# Patient Record
Sex: Female | Born: 1965 | Race: White | Hispanic: No | Marital: Married | Smoking: Never smoker
Health system: Southern US, Community
[De-identification: ages and names within clinical notes are randomized; demographics above are authoritative.]

## PROBLEM LIST (undated history)

## (undated) DIAGNOSIS — C539 Malignant neoplasm of cervix uteri, unspecified: Secondary | ICD-10-CM

## (undated) DIAGNOSIS — F329 Major depressive disorder, single episode, unspecified: Secondary | ICD-10-CM

## (undated) DIAGNOSIS — E119 Type 2 diabetes mellitus without complications: Secondary | ICD-10-CM

## (undated) DIAGNOSIS — K219 Gastro-esophageal reflux disease without esophagitis: Secondary | ICD-10-CM

## (undated) DIAGNOSIS — Z89611 Acquired absence of right leg above knee: Secondary | ICD-10-CM

## (undated) DIAGNOSIS — E785 Hyperlipidemia, unspecified: Secondary | ICD-10-CM

## (undated) DIAGNOSIS — F32A Depression, unspecified: Secondary | ICD-10-CM

## (undated) DIAGNOSIS — G629 Polyneuropathy, unspecified: Secondary | ICD-10-CM

## (undated) DIAGNOSIS — D649 Anemia, unspecified: Secondary | ICD-10-CM

## (undated) DIAGNOSIS — Z89612 Acquired absence of left leg above knee: Secondary | ICD-10-CM

## (undated) DIAGNOSIS — F4310 Post-traumatic stress disorder, unspecified: Secondary | ICD-10-CM

## (undated) HISTORY — PX: OTHER SURGICAL HISTORY: SHX169

## (undated) HISTORY — DX: Depression, unspecified: F32.A

## (undated) HISTORY — DX: Malignant neoplasm of cervix uteri, unspecified: C53.9

## (undated) HISTORY — DX: Gastro-esophageal reflux disease without esophagitis: K21.9

## (undated) HISTORY — DX: Type 2 diabetes mellitus without complications: E11.9

## (undated) HISTORY — PX: ABDOMINAL HYSTERECTOMY: SHX81

## (undated) HISTORY — DX: Hyperlipidemia, unspecified: E78.5

## (undated) HISTORY — DX: Major depressive disorder, single episode, unspecified: F32.9

## (undated) HISTORY — DX: Post-traumatic stress disorder, unspecified: F43.10

---

## 2000-09-29 ENCOUNTER — Encounter: Admission: RE | Admit: 2000-09-29 | Discharge: 2000-10-24 | Payer: Self-pay | Admitting: Family Medicine

## 2004-09-08 ENCOUNTER — Inpatient Hospital Stay (HOSPITAL_COMMUNITY): Admission: EM | Admit: 2004-09-08 | Discharge: 2004-09-22 | Payer: Self-pay | Admitting: Emergency Medicine

## 2005-07-08 HISTORY — PX: UMBILICAL HERNIA REPAIR: SHX196

## 2005-07-27 ENCOUNTER — Inpatient Hospital Stay (HOSPITAL_COMMUNITY): Admission: EM | Admit: 2005-07-27 | Discharge: 2005-07-31 | Payer: Self-pay | Admitting: Emergency Medicine

## 2005-12-11 ENCOUNTER — Emergency Department (HOSPITAL_COMMUNITY): Admission: EM | Admit: 2005-12-11 | Discharge: 2005-12-12 | Payer: Self-pay | Admitting: Emergency Medicine

## 2006-01-02 ENCOUNTER — Ambulatory Visit (HOSPITAL_COMMUNITY): Admission: RE | Admit: 2006-01-02 | Discharge: 2006-01-02 | Payer: Self-pay | Admitting: Family Medicine

## 2006-01-15 ENCOUNTER — Encounter (INDEPENDENT_AMBULATORY_CARE_PROVIDER_SITE_OTHER): Payer: Self-pay | Admitting: Specialist

## 2006-01-15 ENCOUNTER — Ambulatory Visit (HOSPITAL_COMMUNITY): Admission: RE | Admit: 2006-01-15 | Discharge: 2006-01-15 | Payer: Self-pay | Admitting: General Surgery

## 2006-04-15 ENCOUNTER — Emergency Department (HOSPITAL_COMMUNITY): Admission: EM | Admit: 2006-04-15 | Discharge: 2006-04-16 | Payer: Self-pay | Admitting: Emergency Medicine

## 2007-10-15 ENCOUNTER — Ambulatory Visit (HOSPITAL_COMMUNITY): Admission: RE | Admit: 2007-10-15 | Discharge: 2007-10-15 | Payer: Self-pay | Admitting: Family Medicine

## 2007-12-22 ENCOUNTER — Ambulatory Visit (HOSPITAL_COMMUNITY): Admission: RE | Admit: 2007-12-22 | Discharge: 2007-12-22 | Payer: Self-pay | Admitting: Family Medicine

## 2008-04-28 ENCOUNTER — Emergency Department (HOSPITAL_COMMUNITY): Admission: EM | Admit: 2008-04-28 | Discharge: 2008-04-28 | Payer: Self-pay | Admitting: Emergency Medicine

## 2008-08-05 ENCOUNTER — Other Ambulatory Visit: Payer: Self-pay

## 2008-08-05 ENCOUNTER — Other Ambulatory Visit: Payer: Self-pay | Admitting: Emergency Medicine

## 2008-08-05 ENCOUNTER — Other Ambulatory Visit: Payer: Self-pay | Admitting: Physician Assistant

## 2008-08-05 ENCOUNTER — Ambulatory Visit: Payer: Self-pay | Admitting: *Deleted

## 2008-08-05 ENCOUNTER — Inpatient Hospital Stay (HOSPITAL_COMMUNITY): Admission: RE | Admit: 2008-08-05 | Discharge: 2008-08-16 | Payer: Self-pay | Admitting: *Deleted

## 2009-12-26 ENCOUNTER — Inpatient Hospital Stay (HOSPITAL_COMMUNITY): Admission: EM | Admit: 2009-12-26 | Discharge: 2010-01-02 | Payer: Self-pay | Admitting: Emergency Medicine

## 2010-09-23 LAB — CULTURE, BLOOD (ROUTINE X 2)
Culture: NO GROWTH
Report Status: 6262011

## 2010-09-23 LAB — DIFFERENTIAL
Basophils Absolute: 0 10*3/uL (ref 0.0–0.1)
Basophils Absolute: 0 10*3/uL (ref 0.0–0.1)
Basophils Absolute: 0 10*3/uL (ref 0.0–0.1)
Basophils Relative: 1 % (ref 0–1)
Eosinophils Absolute: 0.1 10*3/uL (ref 0.0–0.7)
Eosinophils Absolute: 0.1 10*3/uL (ref 0.0–0.7)
Eosinophils Relative: 1 % (ref 0–5)
Eosinophils Relative: 2 % (ref 0–5)
Lymphocytes Relative: 27 % (ref 12–46)
Lymphocytes Relative: 29 % (ref 12–46)
Lymphocytes Relative: 29 % (ref 12–46)
Lymphocytes Relative: 33 % (ref 12–46)
Lymphs Abs: 1.5 10*3/uL (ref 0.7–4.0)
Lymphs Abs: 1.7 10*3/uL (ref 0.7–4.0)
Lymphs Abs: 1.8 10*3/uL (ref 0.7–4.0)
Monocytes Absolute: 0.3 10*3/uL (ref 0.1–1.0)
Monocytes Absolute: 0.4 10*3/uL (ref 0.1–1.0)
Monocytes Relative: 6 % (ref 3–12)
Monocytes Relative: 7 % (ref 3–12)
Neutro Abs: 2.7 10*3/uL (ref 1.7–7.7)
Neutro Abs: 3.5 10*3/uL (ref 1.7–7.7)
Neutro Abs: 3.7 10*3/uL (ref 1.7–7.7)
Neutro Abs: 3.9 10*3/uL (ref 1.7–7.7)
Neutrophils Relative %: 59 % (ref 43–77)
Neutrophils Relative %: 63 % (ref 43–77)
Neutrophils Relative %: 65 % (ref 43–77)

## 2010-09-23 LAB — GLUCOSE, CAPILLARY
Glucose-Capillary: 106 mg/dL — ABNORMAL HIGH (ref 70–99)
Glucose-Capillary: 113 mg/dL — ABNORMAL HIGH (ref 70–99)
Glucose-Capillary: 156 mg/dL — ABNORMAL HIGH (ref 70–99)
Glucose-Capillary: 167 mg/dL — ABNORMAL HIGH (ref 70–99)
Glucose-Capillary: 178 mg/dL — ABNORMAL HIGH (ref 70–99)
Glucose-Capillary: 208 mg/dL — ABNORMAL HIGH (ref 70–99)
Glucose-Capillary: 219 mg/dL — ABNORMAL HIGH (ref 70–99)
Glucose-Capillary: 239 mg/dL — ABNORMAL HIGH (ref 70–99)
Glucose-Capillary: 245 mg/dL — ABNORMAL HIGH (ref 70–99)
Glucose-Capillary: 246 mg/dL — ABNORMAL HIGH (ref 70–99)
Glucose-Capillary: 250 mg/dL — ABNORMAL HIGH (ref 70–99)
Glucose-Capillary: 257 mg/dL — ABNORMAL HIGH (ref 70–99)
Glucose-Capillary: 262 mg/dL — ABNORMAL HIGH (ref 70–99)
Glucose-Capillary: 264 mg/dL — ABNORMAL HIGH (ref 70–99)

## 2010-09-23 LAB — CBC
HCT: 30 % — ABNORMAL LOW (ref 36.0–46.0)
HCT: 34 % — ABNORMAL LOW (ref 36.0–46.0)
Hemoglobin: 10.3 g/dL — ABNORMAL LOW (ref 12.0–15.0)
Hemoglobin: 11.1 g/dL — ABNORMAL LOW (ref 12.0–15.0)
Hemoglobin: 9.7 g/dL — ABNORMAL LOW (ref 12.0–15.0)
Hemoglobin: 9.8 g/dL — ABNORMAL LOW (ref 12.0–15.0)
MCH: 25 pg — ABNORMAL LOW (ref 26.0–34.0)
MCHC: 32.6 g/dL (ref 30.0–36.0)
MCHC: 32.8 g/dL (ref 30.0–36.0)
MCV: 76.5 fL — ABNORMAL LOW (ref 78.0–100.0)
MCV: 77.4 fL — ABNORMAL LOW (ref 78.0–100.0)
Platelets: 259 10*3/uL (ref 150–400)
Platelets: 267 10*3/uL (ref 150–400)
RBC: 3.79 MIL/uL — ABNORMAL LOW (ref 3.87–5.11)
RBC: 3.92 MIL/uL (ref 3.87–5.11)
RBC: 3.99 MIL/uL (ref 3.87–5.11)
RBC: 4.39 MIL/uL (ref 3.87–5.11)
RDW: 14.5 % (ref 11.5–15.5)
WBC: 4.5 10*3/uL (ref 4.0–10.5)
WBC: 5.4 10*3/uL (ref 4.0–10.5)
WBC: 5.8 10*3/uL (ref 4.0–10.5)

## 2010-09-23 LAB — BASIC METABOLIC PANEL
BUN: 10 mg/dL (ref 6–23)
CO2: 29 mEq/L (ref 19–32)
Calcium: 8.5 mg/dL (ref 8.4–10.5)
Calcium: 8.6 mg/dL (ref 8.4–10.5)
Chloride: 103 mEq/L (ref 96–112)
Chloride: 104 mEq/L (ref 96–112)
Creatinine, Ser: 0.63 mg/dL (ref 0.4–1.2)
Creatinine, Ser: 0.65 mg/dL (ref 0.4–1.2)
Creatinine, Ser: 0.7 mg/dL (ref 0.4–1.2)
GFR calc Af Amer: >60 mL/min (ref >60)
GFR calc Af Amer: >60 mL/min (ref >60)
GFR calc non Af Amer: >60 mL/min (ref >60)
GFR calc non Af Amer: >60 mL/min (ref >60)
Glucose, Bld: 117 mg/dL — ABNORMAL HIGH (ref 70–99)
Potassium: 3.6 mEq/L (ref 3.5–5.1)
Potassium: 3.7 mEq/L (ref 3.5–5.1)
Sodium: 137 mEq/L (ref 135–145)
Sodium: 139 mEq/L (ref 135–145)
Sodium: 140 mEq/L (ref 135–145)

## 2010-09-23 LAB — FERRITIN: Ferritin: 4 ng/mL — ABNORMAL LOW (ref 10–291)

## 2010-09-23 LAB — RETICULOCYTES
RBC.: 3.8 MIL/uL — ABNORMAL LOW (ref 3.87–5.11)
Retic Ct Pct: 2.6 % (ref 0.4–3.1)

## 2010-09-23 LAB — KETONES, QUALITATIVE: Acetone, Bld: NEGATIVE

## 2010-09-23 LAB — VITAMIN B12: Vitamin B-12: 186 pg/mL — ABNORMAL LOW (ref 211–911)

## 2010-09-23 LAB — VANCOMYCIN, TROUGH: Vancomycin Tr: 10.5 ug/mL (ref 10.0–20.0)

## 2010-09-23 LAB — IRON AND TIBC
Saturation Ratios: 6 % — ABNORMAL LOW (ref 20–55)
UIBC: 301 ug/dL

## 2010-10-22 LAB — URINALYSIS, ROUTINE W REFLEX MICROSCOPIC
Glucose, UA: >1000 mg/dL — AB
Hgb urine dipstick: NEGATIVE
Specific Gravity, Urine: 1.02 (ref 1.005–1.030)
pH: 5 (ref 5.0–8.0)

## 2010-10-22 LAB — BASIC METABOLIC PANEL
BUN: 6 mg/dL (ref 6–23)
BUN: 8 mg/dL (ref 6–23)
Chloride: 101 mEq/L (ref 96–112)
Creatinine, Ser: 0.73 mg/dL (ref 0.4–1.2)
Creatinine, Ser: 0.74 mg/dL (ref 0.4–1.2)
GFR calc non Af Amer: >60 mL/min (ref >60)
GFR calc non Af Amer: >60 mL/min (ref >60)

## 2010-10-22 LAB — RAPID URINE DRUG SCREEN, HOSP PERFORMED
Amphetamines: NOT DETECTED
Barbiturates: NOT DETECTED
Opiates: NOT DETECTED

## 2010-10-22 LAB — CBC
MCV: 81.7 fL (ref 78.0–100.0)
Platelets: 301 10*3/uL (ref 150–400)
WBC: 7.1 10*3/uL (ref 4.0–10.5)

## 2010-10-22 LAB — GLUCOSE, CAPILLARY
Glucose-Capillary: 259 mg/dL — ABNORMAL HIGH (ref 70–99)
Glucose-Capillary: 169 mg/dL — ABNORMAL HIGH (ref 70–99)
Glucose-Capillary: 246 mg/dL — ABNORMAL HIGH (ref 70–99)
Glucose-Capillary: 253 mg/dL — ABNORMAL HIGH (ref 70–99)
Glucose-Capillary: 315 mg/dL — ABNORMAL HIGH (ref 70–99)

## 2010-10-22 LAB — URINE MICROSCOPIC-ADD ON

## 2010-10-22 LAB — ETHANOL: Alcohol, Ethyl (B): <5 mg/dL (ref 0–10)

## 2010-10-22 LAB — DIFFERENTIAL
Eosinophils Absolute: 0 10*3/uL (ref 0.0–0.7)
Lymphs Abs: 1.4 10*3/uL (ref 0.7–4.0)
Neutrophils Relative %: 74 % (ref 43–77)

## 2010-10-23 LAB — GLUCOSE, CAPILLARY
Glucose-Capillary: 118 mg/dL — ABNORMAL HIGH (ref 70–99)
Glucose-Capillary: 119 mg/dL — ABNORMAL HIGH (ref 70–99)
Glucose-Capillary: 129 mg/dL — ABNORMAL HIGH (ref 70–99)
Glucose-Capillary: 132 mg/dL — ABNORMAL HIGH (ref 70–99)
Glucose-Capillary: 137 mg/dL — ABNORMAL HIGH (ref 70–99)
Glucose-Capillary: 139 mg/dL — ABNORMAL HIGH (ref 70–99)
Glucose-Capillary: 142 mg/dL — ABNORMAL HIGH (ref 70–99)
Glucose-Capillary: 145 mg/dL — ABNORMAL HIGH (ref 70–99)
Glucose-Capillary: 153 mg/dL — ABNORMAL HIGH (ref 70–99)
Glucose-Capillary: 162 mg/dL — ABNORMAL HIGH (ref 70–99)
Glucose-Capillary: 172 mg/dL — ABNORMAL HIGH (ref 70–99)
Glucose-Capillary: 173 mg/dL — ABNORMAL HIGH (ref 70–99)
Glucose-Capillary: 179 mg/dL — ABNORMAL HIGH (ref 70–99)
Glucose-Capillary: 189 mg/dL — ABNORMAL HIGH (ref 70–99)
Glucose-Capillary: 205 mg/dL — ABNORMAL HIGH (ref 70–99)
Glucose-Capillary: 207 mg/dL — ABNORMAL HIGH (ref 70–99)
Glucose-Capillary: 93 mg/dL (ref 70–99)
Glucose-Capillary: 98 mg/dL (ref 70–99)
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
## 2010-11-23 NOTE — Discharge Summary (Signed)
NAME:  Michelle Skinner, Michelle Skinner                ACCOUNT NO.:  [id number]   MEDICAL RECORD NO.:  [id number]          PATIENT TYPE:  INP   LOCATION:  A313                          FACILITY:  [facility]   PHYSICIAN:  Scott A. Wolfgang Phoenix, MD    DATE OF BIRTH:  Jul 13, 1965   DATE OF ADMISSION:  09/08/2004  DATE OF DISCHARGE:  LH                                 DISCHARGE SUMMARY   DISCHARGE DIAGNOSES:  1.  Group A strep skin infection of the left lower leg with severe skin      ulcerations from it.  2.  Diabetes.  3.  Severe obesity.  4.  Venous stasis edema.   HOSPITAL COURSE:  This patient was admitted in on 09/08/04, and was discharged  on 09/22/04.  She had started off with some sores on her legs over a course  of a few days, with just some sores, large reddened area.  She was admitted  into the hospital, was placed on IV Rocephin and doxycycline.  She had her  glucoses monitored closely.  She was treated with IV antibiotics and quickly  the lesions became bullous in nature, appeared to be very erythematous and  indurated as well.  The patient also had trouble with her glucoses going up,  requiring Lantus.  The patient had dermatology see her, and they recommended  continue on treatment, and an MRI was done on the 10th that did not show any  necrotizing fasciitis.  Also, too, Dr. Orene Desanctis with [facility] was spoken with.  He advised changing the antibiotic to clindamycin 900 mg IV q.8h.  The other  antibiotics were stopped.  She was treated with IV clindamycin over the  course of the next several days.  Glucose stayed under good control with  Lantus.  She gradually improved, but had severe denuding of the skin which  required daily whirlpool treatments, and then daily scrubs, and Silvadene  cream.   DISCHARGE MEDICATIONS:  She was finally stable for discharge.  On the 18th,  she was discharged to home on combination of her usual medications which she  had, plus also Lantus 22 units at night, doxycycline 100 mg  b.i.d.  for the  next 10 days, and apply Silvadene to the leg daily.   DISCHARGE INSTRUCTIONS:  She is also instructed to go get Medicaid set up  for herself, and she is instructed to follow up with [place] in approximately  five days, sooner if problems.   Significant time spent with patient on the day of discharge.      SAL/MEDQ  D:  09/22/2004  T:  09/22/2004  Job:  [id number]

## 2010-11-23 NOTE — Discharge Summary (Signed)
NAME:  Michelle Skinner, Michelle Skinner                ACCOUNT NO.:  [id number]   MEDICAL RECORD NO.:  [id number]          PATIENT TYPE:  INP   LOCATION:  A331                          FACILITY:  [facility]   PHYSICIAN:  Scott A. Wolfgang Phoenix, MD    DATE OF BIRTH:  25-Apr-1966   DATE OF ADMISSION:  07/27/2005  DATE OF DISCHARGE:  01/24/2007LH                                 DISCHARGE SUMMARY   DISCHARGE DIAGNOSES:  1.  Cellulitis of the right leg.  2.  Diabetes.  3.  Obesity.  4.  Chronic pedal edema.   HOSPITAL COURSE:  The patient was admitted in with increased redness,  discomfort, and infection in the right leg. She was treated with  antibiotics. She had a history of MRSA initially treated with doxy and  Levaquin. The patient gradually improved. She was also placed on  clindamycin, and her wound culture grew out some staph. The patient was  stable and felt to ready to go on January 24 and was discharged on  doxycycline b.i.d. for 14 days, increased Lantus and also metformin. Will  follow up in the office in one week, sooner if problems.      Scott A. Wolfgang Phoenix, MD  Electronically Signed     SAL/MEDQ  D:  08/27/2005  T:  08/27/2005  Job:  [id number]

## 2010-11-23 NOTE — Op Note (Signed)
NAME:  Michelle Skinner, CAMPION                ACCOUNT NO.:  [id number]   MEDICAL RECORD NO.:  [id number]          PATIENT TYPE:  AMB   LOCATION:  DAY                           FACILITY:  [facility]   PHYSICIAN:  Jamesetta So, M.D.  DATE OF BIRTH:  Apr 12, 1966   DATE OF PROCEDURE:  01/15/2006  DATE OF DISCHARGE:                                 OPERATIVE REPORT   PREOPERATIVE DIAGNOSIS:  Incisional hernia.   POSTOPERATIVE DIAGNOSIS:  Incisional hernia.   PROCEDURE:  Incisional herniorrhaphy with mesh.   SURGEON:  Jamesetta So, M.D.   ANESTHESIA:  General endotracheal.   INDICATIONS:  The patient is a 45 year old white female who was referred for  evaluation and treatment of an incisional hernia.  She had had tubal  ligation through the umbilicus, in the past, by another surgeon; and now  presents with an enlarging hernia.  The risks and benefits of the procedure  including bleeding, infection, pain, and the possibility of recurrence of  the hernia were fully explained to the patient who gave informed consent.   PROCEDURE NOTE:  The patient was placed in the supine position.  After  induction of general endotracheal anesthesia, the abdomen was prepped and  draped using the usual sterile technique with Betadine.  Surgical site  confirmation was performed.   An infraumbilical transverse incision was made down to the hernia.  The  patient had a large hernia that contained omentum.  The hernia sac was  excised at the fascial level.  A partial omentectomy was performed as not  all the omental contents could be returned into the abdominal cavity.  Once  this was done, the fascial defect was noted to be approximately 6 cm in its  greatest diameter.  The fascia was closed primarily in a transverse fashion  using #0 Ethibond interrupted sutures.  Polypropylene mesh was placed over  this repair and tacked to the fascia using #0 Prolene interrupted sutures.  The base of the umbilicus was secured back  to the mesh using 2-0 Vicryl  suture.  The subcutaneous layer was reapproximated using a 3-0 Vicryl  interrupted suture.  The skin was closed using staples; 0.5% Sensorcaine was  instilled in the surrounding wound.  Betadine ointment and dry sterile  dressing were applied.   All tape and needle counts were correct at the end of the procedure.  The  patient was extubated in the operating room and went back to the recovery  room awake in stable condition.   COMPLICATIONS:  None.   SPECIMEN:  Omentum and hernia sac.   BLOOD LOSS:  Less than 100 mL.      Jamesetta So, M.D.  Electronically Signed     MAJ/MEDQ  D:  01/15/2006  T:  01/15/2006  Job:  [id number]   cc:   Nicki Reaper A. Wolfgang Phoenix, MD  Fax: [phone]

## 2010-11-23 NOTE — Discharge Summary (Signed)
NAME:  Michelle Skinner, Michelle Skinner                ACCOUNT NO.:  [id number]   MEDICAL RECORD NO.:  [id number]          PATIENT TYPE:  INP   LOCATION:  A313                          FACILITY:  [facility]   PHYSICIAN:  Scott A. Wolfgang Phoenix, MD    DATE OF BIRTH:  Oct 31, 1965   DATE OF ADMISSION:  09/08/2004  DATE OF DISCHARGE:  03/18/2006LH                                 DISCHARGE SUMMARY   DISCHARGE DIAGNOSES:  1.  Severe cellulitis of left lower leg, group A Streptococcus.  2.  Diabetes.  3.  Necrolysis of the skin secondary to cellulitis.   I'm sorry, please cancel this whole dictation.  I am just looking through  the chart.  I found that we got a discharge summary.      SAL/MEDQ  D:  10/02/2004  T:  10/02/2004  Job:  [id number]

## 2010-11-23 NOTE — H&P (Signed)
NAME:  Michelle Skinner, Michelle Skinner                ACCOUNT NO.:  [id number]   MEDICAL RECORD NO.:  [id number]          PATIENT TYPE:  INP   LOCATION:  A331                          FACILITY:  [facility]   PHYSICIAN:  Scott A. Wolfgang Phoenix, MD    DATE OF BIRTH:  1966-06-09   DATE OF ADMISSION:  07/27/2005  DATE OF DISCHARGE:  LH                                HISTORY & PHYSICAL   CHIEF COMPLAINT:  Right leg swelling, redness, pain, discomfort, fever,  chills.   HISTORY OF PRESENT ILLNESS:  This is a 45 year old morbidly obese white  female who about 2 days ago started noticing a little bit of discomfort in  the right leg then she started having fever and chills then from last night  into today she has noted rapidly increasing redness, soreness and discomfort  that is spreading up her leg.  She denies nausea, vomiting, diarrhea.  She  does relate fever and chills.  She does state energy level is somewhat  decreased, is able to eat and drink.  The patient states her sugars have  been running a little higher than normal.  The patient states she has been  compliant with her medicine recently although in the past she has not been  compliant.   PAST MEDICAL HISTORY:  The patient has multiple health problems including  severe morbid obesity, depression, migraines, and diabetes type 2.  The  patient had leg infection back in 2006 with streptococcus A and of note the  patient was treated back then with doxycycline for also MRSA coverage.   FAMILY HISTORY:  Hypertension, diabetes, heart disease.   SOCIAL HISTORY:  He is married and has four children.   ALLERGIES:  None.   MEDICATIONS:  Lantus 22 units at bedtime and metformin 500 mg twice daily.   PHYSICAL EXAMINATION:  HEENT:  Benign.  TMs - NL.  T - NL.  CHEST:  CTA.  __________ .  HEART:  Regular.  No murmurs or gallops.  Pulse normal.  ABDOMEN:  Soft, obese.  EXTREMITIES:  Has chronic lymphedema of the left leg.  This has been  recalcitrant over the years.   The patient does not wear surgical support  hose this side as she had infection, on right side has significant obesity  with significant cellulitis from the ankle region all the way to about the  knee.  No abscesses felt.   LABORATORIES:  MET-7/WBC was drawn.  White count 6.9, hemoglobin 12.3.  It  should be noted that the BMET is pending.   ASSESSMENT AND PLAN:  Cellulitis.  Treat with Levaquin plus also  doxycycline.  Go ahead with the culture.  Once skin culture comes back if it  is group A or if we start seeing any type of skin ulceration consistent with  group A we will also consider using clindamycin (last time she was on 900 mg  IV q.8 h).  Follow glucoses closely.  Await how the patient does over the  course of next few days.      Scott A. Wolfgang Phoenix, MD  Electronically Signed  SAL/MEDQ  D:  07/27/2005  T:  07/27/2005  Job:  [id number]

## 2010-11-23 NOTE — H&P (Signed)
NAME:  Michelle Skinner, Michelle Skinner                ACCOUNT NO.:  [id number]   MEDICAL RECORD NO.:  [id number]          PATIENT TYPE:  INP   LOCATION:  A313                          FACILITY:  [facility]   PHYSICIAN:  Margaretmary Eddy, M.D.DATE OF BIRTH:  03/22/1966   DATE OF ADMISSION:  09/08/2004  DATE OF DISCHARGE:  LH                                HISTORY & PHYSICAL   CHIEF COMPLAINT:  Fever, swelling, painful legs.   SUBJECTIVE:  This patient is a 45 year old white female with a history of  known diabetes controlled by diet alone, history of migraines and history of  depression along with morbid obesity who presents to the emergency room the  day of admission with new concerns.  The patient noted a couple of sores  crop up on her legs several days ago.  She began to develop redness and  tenderness and warmth in both legs with her left leg more involved than her  right.  She has a history of chronic edema.  She has taken diuretics for  this in the past.  She has a history of morbid obesity.  She states she has  a history of diabetes, but it has been controlled by diet alone.  She has  not checked her sugar for some time.  She has been on no medicines for some  time.  For the last 24 hours, the patient has had several bouts of fever and  chills.  This has been accompanied by a shaking.  The patient has felt  lightheaded when she stands quickly.  Her appetite has been diminished.  As  noted, she is taking no medications at this time.   ALLERGIES:  No known allergies.   SOCIAL HISTORY:  The patient is a housewife, currently out of work.  Married.  No tobacco use.  No alcohol use.  She does have four children.   PAST SURGICAL HISTORY:  Remote bilateral tubal ligation.   FAMILY HISTORY:  Positive for hypertension, diabetes, coronary artery  disease.   REVIEW OF SYSTEMS:  Otherwise negative.   PHYSICAL EXAMINATION:  VITAL SIGNS:  Temperature 101.1, pulse 120.  GENERAL:  The patient is alert in some  discomfort with her legs.  Morbid  obesity.  HEENT:  Normal.  Pharynx slight erythema.  NECK:  Supple.  LUNGS:  Clear.  HEART:  Regular rhythm.  ABDOMEN:  Large.  No obvious tenderness.  LEGS:  Bilateral edema.  Significant erythema.  Tenderness, diffuse warmth  and redness.  There are also ulcerations evident on both legs.   LABORATORY DATA:  Hemoglobin 13.4.  White blood count 13.4, neutrophils 96%.  Creatinine 1.2, BUN 8.  SGOT slightly elevated at 49.  Albumin 2.7.  Blood  culture obtained.   IMPRESSION:  Cellulitis with ulcerations, ? methicillin-resistant  Staphylococcus aureus involvement.   PLAN:  1.  Rocephin IV.  2.  Doxycycline IV.  3.  Close watch of sugars.  4.  IV fluids.  5.  Further orders on chart.      WSL/MEDQ  D:  09/08/2004  T:  09/08/2004  Job:  [id number] 

## 2010-11-23 NOTE — H&P (Signed)
NAME:  Michelle Skinner, Michelle Skinner                ACCOUNT NO.:  [id number]   MEDICAL RECORD NO.:  [id number]          PATIENT TYPE:  AMB   LOCATION:  DAY                           FACILITY:  [facility]   PHYSICIAN:  Jamesetta So, M.D.  DATE OF BIRTH:  1965-12-18   DATE OF ADMISSION:  DATE OF DISCHARGE:  LH                                HISTORY & PHYSICAL   CHIEF COMPLAINT:  Incisional hernia.   HISTORY OF PRESENT ILLNESS:  The patient is a 45 year old white female who  is referred for evaluation and treatment of an incisional hernia.  It has  been present for some time since having a tubal ligation by another surgeon  in the past.  The swelling in the umbilical region has been getting larger  and is causing her discomfort.   PAST MEDICAL HISTORY:  Includes morbid obesity, insulin-dependent diabetes  mellitus.   PAST SURGICAL HISTORY:  Tubal ligation.   CURRENT MEDICATIONS:  Metformin, doxycycline, hydrocodone, __________,  AcipHex, Lantus.   ALLERGIES:  No known drug allergies.   REVIEW OF SYSTEMS:  The patient denies drinking or smoking.   PHYSICAL EXAMINATION:  GENERAL:  The patient is a morbidly obese white  female in no acute distress.  LUNGS:  Clear to auscultation with equal breath sounds bilaterally.  HEART:  Regular rate and rhythm without S3, S4, or murmurs.  ABDOMEN:  Soft and nondistended.  No hepatosplenomegaly or masses noted.  Large size umbilical hernia is present in the surgical scar.   IMPRESSION:  An incisional hernia.   PLAN:  The patient is scheduled for an incisional herniorrhaphy with mesh on  January 15, 2006.  Risks and benefits of the procedure including bleeding,  infection, pain, and recurrence of the hernia were fully explained to the  patient, gave informed consent.      Jamesetta So, M.D.  Electronically Signed     MAJ/MEDQ  D:  01/09/2006  T:  01/09/2006  Job:  [id number]   cc:   Nicki Reaper A. Wolfgang Phoenix, MD  Fax: [phone]
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
## 2013-01-14 ENCOUNTER — Other Ambulatory Visit (HOSPITAL_COMMUNITY): Payer: Self-pay | Admitting: Obstetrics and Gynecology

## 2013-01-14 DIAGNOSIS — C539 Malignant neoplasm of cervix uteri, unspecified: Secondary | ICD-10-CM

## 2013-01-20 ENCOUNTER — Encounter (HOSPITAL_COMMUNITY): Payer: Medicare Other

## 2013-01-25 ENCOUNTER — Encounter (HOSPITAL_COMMUNITY)
Admission: RE | Admit: 2013-01-25 | Discharge: 2013-01-25 | Disposition: A | Discharge status: Home / Self Care | Payer: Medicare Other | Source: Ambulatory Visit | Attending: Obstetrics and Gynecology | Admitting: Obstetrics and Gynecology

## 2013-01-25 DIAGNOSIS — C539 Malignant neoplasm of cervix uteri, unspecified: Secondary | ICD-10-CM | POA: Insufficient documentation

## 2013-01-25 LAB — GLUCOSE, CAPILLARY: Glucose-Capillary: 353 mg/dL — ABNORMAL HIGH (ref 70–99)

## 2013-01-25 MED ORDER — FLUDEOXYGLUCOSE F - 18 (FDG) INJECTION
23.0000 | Freq: Once | INTRAVENOUS | Status: AC | PRN
  Administered 2013-01-25: 23 via INTRAVENOUS

## 2013-02-05 DIAGNOSIS — D649 Anemia, unspecified: Secondary | ICD-10-CM

## 2013-02-05 DIAGNOSIS — C539 Malignant neoplasm of cervix uteri, unspecified: Secondary | ICD-10-CM

## 2013-02-05 DIAGNOSIS — E876 Hypokalemia: Secondary | ICD-10-CM

## 2014-09-07 ENCOUNTER — Inpatient Hospital Stay (HOSPITAL_COMMUNITY): Admission: RE | Admit: 2014-09-07 | Payer: Medicare Other | Source: Ambulatory Visit

## 2014-09-09 ENCOUNTER — Ambulatory Visit (HOSPITAL_COMMUNITY)
Admission: RE | Admit: 2014-09-09 | Discharge: 2014-09-09 | Disposition: A | Discharge status: Home / Self Care | Payer: Medicare Other | Source: Ambulatory Visit | Attending: Internal Medicine | Admitting: Internal Medicine

## 2014-09-09 DIAGNOSIS — E119 Type 2 diabetes mellitus without complications: Secondary | ICD-10-CM | POA: Diagnosis not present

## 2014-09-09 DIAGNOSIS — L03115 Cellulitis of right lower limb: Secondary | ICD-10-CM | POA: Insufficient documentation

## 2014-09-09 LAB — GLUCOSE, CAPILLARY: GLUCOSE-CAPILLARY: 361 mg/dL — AB (ref 70–99)

## 2014-09-09 NOTE — Progress Notes (Signed)
Peripherally Inserted Central Catheter/Midline Placement  The IV Nurse has discussed with the patient and/or persons authorized to consent for the patient, the purpose of this procedure and the potential benefits and risks involved with this procedure.  The benefits include less needle sticks, lab draws from the catheter and patient may be discharged home with the catheter.  Risks include, but not limited to, infection, bleeding, blood clot (thrombus formation), and puncture of an artery; nerve damage and irregular heat beat.  Alternatives to this procedure were also discussed.  PICC/Midline Placement Documentation  PICC / Midline Single Lumen XX123456 PICC Right Basilic 42 cm 0 cm (Active)  Indication for Insertion or Continuance of Line Home intravenous therapies (PICC only) 09/09/2014  2:08 PM  Exposed Catheter (cm) 0 cm 09/09/2014  2:08 PM  Site Assessment Clean;Dry;Intact 09/09/2014  2:08 PM  Line Status Flushed;Capped (central line);Blood return noted 09/09/2014  2:08 PM  Dressing Type Transparent;Securing device 09/09/2014  2:08 PM  Dressing Status Clean;Dry;Intact;Antimicrobial disc in place 09/09/2014  2:08 PM  Line Care Connections checked and tightened 09/09/2014  2:08 PM  Line Adjustment (NICU/IV Team Only) No 09/09/2014  2:08 PM  Dressing Intervention New dressing 09/09/2014  2:08 PM  Dressing Change Due 09/16/14 09/09/2014  2:08 PM    PICC inserted by Assunta Found, RN without difficulty. Tip confirmed with 3CG technology.    Charm Barges S 09/09/2014, 2:09 PM

## 2014-09-09 NOTE — Discharge Instructions (Signed)
PICC Home Guide A peripherally inserted central catheter (PICC) is a long, thin, flexible tube that is inserted into a vein in the upper arm. It is a form of intravenous (IV) access. It is considered to be a "central" line because the tip of the PICC ends in a large vein in your chest. This large vein is called the superior vena cava (SVC). The PICC tip ends in the SVC because there is a lot of blood flow in the SVC. This allows medicines and IV fluids to be quickly distributed throughout the body. The PICC is inserted using a sterile technique by a specially trained nurse or physician. After the PICC is inserted, a chest X-ray exam is done to be sure it is in the correct place.  A PICC may be placed for different reasons, such as:  To give medicines and liquid nutrition that can only be given through a central line. Examples are:  Certain antibiotic treatments.  Chemotherapy.  Total parenteral nutrition (TPN).  To take frequent blood samples.  To give IV fluids and blood products.  If there is difficulty placing a peripheral intravenous (PIV) catheter. If taken care of properly, a PICC can remain in place for several months. A PICC can also allow a person to go home from the hospital early. Medicine and PICC care can be managed at home by a family member or home health care team. WHAT PROBLEMS CAN HAPPEN WHEN I HAVE A PICC? Problems with a PICC can occasionally occur. These may include the following:  A blood clot (thrombus) forming in or at the tip of the PICC. This can cause the PICC to become clogged. A clot-dissolving medicine called tissue plasminogen activator (tPA) can be given through the PICC to help break up the clot.  Inflammation of the vein (phlebitis) in which the PICC is placed. Signs of inflammation may include redness, pain at the insertion site, red streaks, or being able to feel a "cord" in the vein where the PICC is located.  Infection in the PICC or at the insertion  site. Signs of infection may include fever, chills, redness, swelling, or pus drainage from the PICC insertion site.  PICC movement (malposition). The PICC tip may move from its original position due to excessive physical activity, forceful coughing, sneezing, or vomiting.  A break or cut in the PICC. It is important to not use scissors near the PICC.  Nerve or tendon irritation or injury during PICC insertion. WHAT SHOULD I KEEP IN MIND ABOUT ACTIVITIES WHEN I HAVE A PICC?  You may bend your arm and move it freely. If your PICC is near or at the bend of your elbow, avoid activity with repeated motion at the elbow.  Rest at home for the remainder of the day following PICC line insertion.  Avoid lifting heavy objects as instructed by your health care provider.  Avoid using a crutch with the arm on the same side as your PICC. You may need to use a walker. WHAT SHOULD I KNOW ABOUT MY PICC DRESSING?  Keep your PICC bandage (dressing) clean and dry to prevent infection.  Ask your health care provider when you may shower. Ask your health care provider to teach you how to wrap the PICC when you do take a shower.  Change the PICC dressing as instructed by your health care provider.  Change your PICC dressing if it becomes loose or wet. WHAT SHOULD I KNOW ABOUT PICC CARE?  Check the PICC insertion site   daily for leakage, redness, swelling, or pain.  Do not take a bath, swim, or use hot tubs when you have a PICC. Cover PICC line with clear plastic wrap and tape to keep it dry while showering.  Flush the PICC as directed by your health care provider. Let your health care provider know right away if the PICC is difficult to flush or does not flush. Do not use force to flush the PICC.  Do not use a syringe that is less than 10 mL to flush the PICC.  Never pull or tug on the PICC.  Avoid blood pressure checks on the arm with the PICC.  Keep your PICC identification card with you at all  times.  Do not take the PICC out yourself. Only a trained clinical professional should remove the PICC. SEEK IMMEDIATE MEDICAL CARE IF:  Your PICC is accidentally pulled all the way out. If this happens, cover the insertion site with a bandage or gauze dressing. Do not throw the PICC away. Your health care provider will need to inspect it.  Your PICC was tugged or pulled and has partially come out. Do not  push the PICC back in.  There is any type of drainage, redness, or swelling where the PICC enters the skin.  You cannot flush the PICC, it is difficult to flush, or the PICC leaks around the insertion site when it is flushed.  You hear a "flushing" sound when the PICC is flushed.  You have pain, discomfort, or numbness in your arm, shoulder, or jaw on the same side as the PICC.  You feel your heart "racing" or skipping beats.  You notice a hole or tear in the PICC.  You develop chills or a fever. MAKE SURE YOU:   Understand these instructions.  Will watch your condition.  Will get help right away if you are not doing well or get worse. Document Released: 12/29/2002 Document Revised: 11/08/2013 Document Reviewed: 03/01/2013 [facility] Patient Information 2015 [place], [place]. This information is not intended to replace advice given to you by your health care provider. Make sure you discuss any questions you have with your health care provider. PICC Insertion, Care After Refer to this sheet in the next few weeks. These instructions provide you with information on caring for yourself after your procedure. Your health care provider may also give you more specific instructions. Your treatment has been planned according to current medical practices, but problems sometimes occur. Call your health care provider if you have any problems or questions after your procedure. WHAT TO EXPECT AFTER THE PROCEDURE After your procedure, it is typical to have the following:  Mild discomfort at the  insertion site. This should not last more than a day. HOME CARE INSTRUCTIONS  Rest at home for the remainder of the day after the procedure.  You may bend your arm and move it freely. If your PICC is near or at the bend of your elbow, avoid activity with repeated motion at the elbow.  Avoid lifting heavy objects as instructed by your health care provider.  Avoid using a crutch with the arm on the same side as your PICC. You may need to use a walker. Bandage Care  Keep your PICC bandage (dressing) clean and dry to prevent infection.  Ask your health care provider when you may shower. To keep the dressing dry, cover the PICC with plastic wrap and tape before showering. If the dressing does become wet, replace it right after the shower.  Do not soak in the bath, swim, or use hot tubs when you have a PICC.  Change the PICC dressing as instructed by your health care provider.  Change your PICC dressing if it becomes loose or wet. General PICC Care  Check the PICC insertion site daily for leakage, redness, swelling, or pain.  Flush the PICC as directed by your health care provider. Let your health care provider know right away if the PICC is difficult to flush or does not flush. Do not use force to flush the PICC.  Do not use a syringe that is less than 10 mL to flush the PICC.  Never pull or tug on the PICC.  Avoid blood pressure checks on the arm with the PICC.  Keep your PICC identification card with you at all times.  Do not take the PICC out yourself. Only a trained health care professional should remove the PICC. SEEK MEDICAL CARE IF:  You have pain in your arm, ear, face, or teeth.  You have fever or chills.  You have drainage from the PICC insertion site.  You have redness or palpate a "cord" around the PICC insertion site.  You cannot flush the catheter. SEEK IMMEDIATE MEDICAL CARE IF:  You have swelling in the arm in which the PICC is inserted. Document Released:  04/14/2013 Document Revised: 06/29/2013 Document Reviewed: 04/14/2013 [facility] Patient Information 2015 [place], [place]. This information is not intended to replace advice given to you by your health care provider. Make sure you discuss any questions you have with your health care provider. Peripherally Inserted Central Catheter/Midline Placement  The IV Nurse has discussed with the patient and/or persons authorized to consent for the patient, the purpose of this procedure and the potential benefits and risks involved with this procedure.  The benefits include less needle sticks, lab draws from the catheter and patient may be discharged home with the catheter.  Risks include, but not limited to, infection, bleeding, blood clot (thrombus formation), and puncture of an artery; nerve damage and irregular heat beat.  Alternatives to this procedure were also discussed.  PICC/Midline Placement Documentation  PICC / Midline Single Lumen XX123456 PICC Right Basilic 42 cm 0 cm (Active)  Indication for Insertion or Continuance of Line Home intravenous therapies (PICC only) 09/09/2014  2:08 PM  Exposed Catheter (cm) 0 cm 09/09/2014  2:08 PM  Site Assessment Clean;Dry;Intact 09/09/2014  2:08 PM  Line Status Flushed;Capped (central line);Blood return noted 09/09/2014  2:08 PM  Dressing Type Transparent;Securing device 09/09/2014  2:08 PM  Dressing Status Clean;Dry;Intact;Antimicrobial disc in place 09/09/2014  2:08 PM  Line Care Connections checked and tightened 09/09/2014  2:08 PM  Line Adjustment (NICU/IV Team Only) No 09/09/2014  2:08 PM  Dressing Intervention New dressing 09/09/2014  2:08 PM  Dressing Change Due 09/16/14 09/09/2014  2:08 PM       Chris Narasimhan, Essie Hart 09/09/2014, 2:10 PM

## 2015-04-25 ENCOUNTER — Encounter: Payer: Self-pay | Admitting: "Endocrinology

## 2015-04-25 ENCOUNTER — Ambulatory Visit (INDEPENDENT_AMBULATORY_CARE_PROVIDER_SITE_OTHER): Payer: Medicare Other | Admitting: "Endocrinology

## 2015-04-25 VITALS — BP 116/76 | HR 93 | Ht 67.5 in | Wt 292.0 lb

## 2015-04-25 DIAGNOSIS — Z794 Long term (current) use of insulin: Secondary | ICD-10-CM | POA: Diagnosis not present

## 2015-04-25 DIAGNOSIS — E118 Type 2 diabetes mellitus with unspecified complications: Secondary | ICD-10-CM

## 2015-04-25 DIAGNOSIS — E1159 Type 2 diabetes mellitus with other circulatory complications: Secondary | ICD-10-CM | POA: Insufficient documentation

## 2015-04-25 DIAGNOSIS — E1165 Type 2 diabetes mellitus with hyperglycemia: Secondary | ICD-10-CM | POA: Insufficient documentation

## 2015-04-25 DIAGNOSIS — E559 Vitamin D deficiency, unspecified: Secondary | ICD-10-CM

## 2015-04-25 DIAGNOSIS — E669 Obesity, unspecified: Secondary | ICD-10-CM | POA: Insufficient documentation

## 2015-04-25 DIAGNOSIS — IMO0002 Reserved for concepts with insufficient information to code with codable children: Secondary | ICD-10-CM

## 2015-04-25 LAB — LIPID PANEL
CHOL/HDL RATIO: 6.6 ratio — AB (ref <=5.0)
Cholesterol: 285 mg/dL — ABNORMAL HIGH (ref 125–200)
HDL: 43 mg/dL — AB (ref >=46)
LDL Cholesterol: 200 mg/dL — ABNORMAL HIGH (ref <130)
Triglycerides: 208 mg/dL — ABNORMAL HIGH (ref <150)
VLDL: 42 mg/dL — AB (ref <30)

## 2015-04-25 LAB — BASIC METABOLIC PANEL
BUN: 14 mg/dL (ref 7–25)
CO2: 26 mmol/L (ref 20–31)
Calcium: 9.3 mg/dL (ref 8.6–10.2)
Chloride: 94 mmol/L — ABNORMAL LOW (ref 98–110)
Creat: 0.95 mg/dL (ref 0.50–1.10)
GLUCOSE: 466 mg/dL — AB (ref 65–99)
POTASSIUM: 5 mmol/L (ref 3.5–5.3)
SODIUM: 133 mmol/L — AB (ref 135–146)

## 2015-04-25 NOTE — Progress Notes (Signed)
Subjective:    Patient ID: Michelle Skinner, female    DOB: Nov 08, 1965,    Past Medical History  Diagnosis Date  . Diabetes mellitus, type II ([place])   . Hyperlipidemia   . Depression   . GERD (gastroesophageal reflux disease)   . Cervical cancer [facility])    Past Surgical History  Procedure Laterality Date  . Other surgical history      R Foot  . Abdominal hysterectomy     Social History   Social History  . Marital Status: Married    Spouse Name: N/A  . Number of Children: N/A  . Years of Education: N/A   Social History Main Topics  . Smoking status: Never Smoker   . Smokeless tobacco: None  . Alcohol Use: No  . Drug Use: No  . Sexual Activity: Not Asked   Other Topics Concern  . None   Social History Narrative  . None   Outpatient Encounter Prescriptions as of 04/25/2015  Medication Sig  . insulin aspart (NOVOLOG FLEXPEN) 100 UNIT/ML FlexPen Inject 15 Units into the skin 3 (three) times daily with meals.  . Insulin Degludec (TRESIBA FLEXTOUCH) 200 UNIT/ML SOPN Inject 63 Units into the skin daily.  . pregabalin (LYRICA) 75 MG capsule Take 75 mg by mouth 2 (two) times daily.   No facility-administered encounter medications on file as of 04/25/2015.   ALLERGIES: Allergies  Allergen Reactions  . Bactrim [Sulfamethoxazole-Trimethoprim]   . Gabapentin   . Invokana [Canagliflozin]    VACCINATION STATUS:  There is no immunization history on file for this patient.  HPI  The patient presents with a medical history as above. The patient is being seen in consultation for currently uncontrolled type 2 DM requested by Dr. Woody Seller.  Patient was diagnosed with type 2 DM approximately at age 26 years. Patient's recent A1c was found to be  12.7% consistent with uncontrolled status.  Patient reports moderate degree of polydipsia, polyuria, and nocturia. Patient is currently on Tresiba 63 units qhs, Novolog 15 units TIDAC. She mentions severe intolerance to MTF, Byetta,  Invokana.   The patient did not bring their meter, log book, and is not monitoring BG regularly. The patient is also being treated for hypertension, hyperlipidemia and has been overweight to obese most of their adult life. Patient has neuropathy, blurry vision. She denies history of CAD, CVA, CKD. Patient has a family history of type 2 DM in her father, GP, and other family members. Patient is a  Non-smoker, does not   participate in a regular exercise program. Patient is reluctantly accepting  to engage in intensive monitoring and therapy along with change in life style.   Review of Systems  Constitutional: Positive for fatigue. Negative for unexpected weight change.  HENT: Negative for trouble swallowing and voice change.   Eyes: Negative for visual disturbance.  Respiratory: Negative for cough, shortness of breath and wheezing.   Cardiovascular: Negative for chest pain, palpitations and leg swelling.  Gastrointestinal: Negative for nausea, vomiting and diarrhea.  Endocrine: Positive for polydipsia and polyuria. Negative for cold intolerance, heat intolerance and polyphagia.  Musculoskeletal: Negative for myalgias and arthralgias.  Skin: Negative for color change, pallor, rash and wound.  Neurological: Negative for seizures and headaches.  Psychiatric/Behavioral: Negative for suicidal ideas and confusion.    Objective:    BP 116/76 mmHg  Pulse 93  Ht 5' 7.5" (1.715 m)  Wt 292 lb (132.45 kg)  BMI 45.03 kg/m2  SpO2 97%  Wt  Readings from Last 3 Encounters:  04/25/15 292 lb (132.45 kg)    Physical Exam  Constitutional: She is oriented to person, place, and time. She appears well-developed.  She has poor hygiene generally.  HENT:  Head: Normocephalic and atraumatic.  She has poor dentition.  Eyes: EOM are normal.  Neck: Normal range of motion. Neck supple. No tracheal deviation present. No thyromegaly present.  Cardiovascular: Normal rate and regular rhythm.   Pulmonary/Chest:  Effort normal and breath sounds normal.  Abdominal: Soft. Bowel sounds are normal. There is no tenderness. There is no guarding.  Musculoskeletal: Normal range of motion. She exhibits no edema.  Neurological: She is alert and oriented to person, place, and time. She has normal reflexes. No cranial nerve deficit. Coordination normal.  Skin: Skin is warm and dry. No rash noted. No erythema. No pallor.  Psychiatric: She has a normal mood and affect. Judgment normal.  Reluctant affect .    Results for orders placed or performed during the hospital encounter of 09/09/14  Glucose, capillary  Result Value Ref Range   Glucose-Capillary 361 (H) 70 - 99 mg/dL   Complete Blood Count (Most recent): Lab Results  Component Value Date   WBC 5.4 01/01/2010   HGB 9.8* 01/01/2010   HCT 30.0* 01/01/2010   MCV 76.5* 01/01/2010   PLT 267 01/01/2010   Chemistry (most recent): Lab Results  Component Value Date   NA 139 01/01/2010   K 3.7 01/01/2010   CL 104 01/01/2010   CO2 29 01/01/2010   BUN 10 01/01/2010   CREATININE 0.63 01/01/2010   Diabetic Labs (most recent): No results found for: HGBA1C Lipid profile (most recent): No results found for: TRIG, CHOL       Assessment & Plan:   1. Uncontrolled type 2 diabetes mellitus with complication, with long-term current use of insulin ([place])   Her diabetes is complicated by neuropathy and possible retinopathy. - Patient has currently uncontrolled symptomatic type 2 DM of approximately since age 4 years duration, with most recent A1c of 12.7 %.  No recent labs to review.  -Patient remains at a high risk for more acute and chronic complications of diabetes which include CAD, CVA, CKD, retinopathy, and neuropathy. These are all discussed in detail with the patient.  - I have counseled the patient on diet management and weight loss, by adopting a carbohydrate restricted/protein rich diet.  - Patient is advised to stick to a routine mealtimes to  eat 3 meals  a day and avoid unnecessary snacks ( to snack only to correct hypoglycemia).   - Suggestion is made for patient to avoid simple carbohydrates   from their diet including Cakes , Desserts, Ice Cream,  Soda (  diet and regular) , Sweet Tea , Candies,  Chips, Cookies, Artificial Sweeteners,   and "Sugar-free" Products . This will help patient to have stable blood glucose profile and potentially avoid unintended weight gain.  - I encouraged the patient to switch to  unprocessed or minimally processed complex starch and increased protein intake (animal or plant source), fruits, and vegetables.  - The patient will be scheduled with Jearld Fenton, RDN, CDE for individualized DM education.  - I have approached patient with the following individualized plan to manage diabetes and patient agrees:   - I  will proceed with Tresiba to 40 units qhs, hold Novolog for now because she is not monitoring BG. -She is advised to start strict monitoring of glucose  AC and HS. - Patient  is warned not to take insulin without proper monitoring per orders.  -Patient is encouraged to call clinic for blood glucose levels less than 70 or above 300 mg /dl. - she is adamant saying she does not tolerate MTF, Invokana, and Byetta.  I will send her to lab today to obtain: - TSH - T4, free - Hemoglobin [id number] - Basic metabolic panel - Lipid panel - Patient will be considered for incretin therapy as appropriate next visit. - Patient specific target  A1c;  LDL, HDL, Triglycerides, and  Waist Circumference were discussed in detail.  2) BP/HTN: Controlled. Continue current medications. 3) Lipids/HPL: LDL  Levels unknown.  4)  Weight/Diet: CDE consult in progress, exercise, and carbohydrates information provided. She  - Amb ref to Medical Nutrition Therapy-MNT 5) Chronic Care/Health Maintenance:  -Patient is not  on ACEI and Statin medications , will consider next visit. She is encouraged to continue to follow  up with Ophthalmology, Podiatrist at least yearly or according to recommendations, and advised to stay away from smoking. I have recommended yearly flu vaccine and pneumonia vaccination at least every 5 years; moderate intensity exercise for up to 150 minutes weekly; and  sleep for at least 7 hours a day.   Patient to bring meter and  blood glucose logs during their next visit.   I advised patient to maintain close follow up with their PCP for primary care needs. Follow up plan: Return in about 1 week (around 05/02/2015) for labs today, follow up with pre-visit labs, meter, and logs, diabetes, underactive thyroid, weight ma.  Glade Lloyd, MD Phone: [phone]  Fax: [phone]   04/25/2015, 11:18 PM

## 2015-04-25 NOTE — Patient Instructions (Signed)

## 2015-04-26 LAB — TSH: TSH: 5.551 u[IU]/mL — ABNORMAL HIGH (ref 0.350–4.500)

## 2015-04-26 LAB — HEMOGLOBIN A1C
Hgb A1c MFr Bld: 12.4 % — ABNORMAL HIGH (ref <5.7)
MEAN PLASMA GLUCOSE: 309 mg/dL — AB (ref <117)

## 2015-04-26 LAB — T4, FREE: Free T4: 1.16 ng/dL (ref 0.80–1.80)

## 2015-05-03 ENCOUNTER — Ambulatory Visit (INDEPENDENT_AMBULATORY_CARE_PROVIDER_SITE_OTHER): Payer: Medicare Other | Admitting: "Endocrinology

## 2015-05-03 ENCOUNTER — Encounter: Payer: Self-pay | Admitting: "Endocrinology

## 2015-05-03 VITALS — BP 150/82 | HR 80 | Ht 67.5 in | Wt 300.0 lb

## 2015-05-03 DIAGNOSIS — E039 Hypothyroidism, unspecified: Secondary | ICD-10-CM | POA: Diagnosis not present

## 2015-05-03 DIAGNOSIS — E782 Mixed hyperlipidemia: Secondary | ICD-10-CM | POA: Insufficient documentation

## 2015-05-03 DIAGNOSIS — E1165 Type 2 diabetes mellitus with hyperglycemia: Secondary | ICD-10-CM | POA: Diagnosis not present

## 2015-05-03 DIAGNOSIS — IMO0002 Reserved for concepts with insufficient information to code with codable children: Secondary | ICD-10-CM

## 2015-05-03 DIAGNOSIS — E785 Hyperlipidemia, unspecified: Secondary | ICD-10-CM

## 2015-05-03 DIAGNOSIS — E118 Type 2 diabetes mellitus with unspecified complications: Secondary | ICD-10-CM

## 2015-05-03 DIAGNOSIS — Z794 Long term (current) use of insulin: Secondary | ICD-10-CM

## 2015-05-03 MED ORDER — LEVOTHYROXINE SODIUM 50 MCG PO TABS: 50.0000 ug | ORAL_TABLET | Freq: Every day | ORAL | Status: DC

## 2015-05-03 MED ORDER — INSULIN ASPART 100 UNIT/ML FLEXPEN: 10.0000 [IU] | PEN_INJECTOR | Freq: Three times a day (TID) | SUBCUTANEOUS | Status: DC

## 2015-05-03 MED ORDER — ROSUVASTATIN CALCIUM 10 MG PO TABS: 10.0000 mg | ORAL_TABLET | Freq: Every day | ORAL | Status: DC

## 2015-05-03 NOTE — Patient Instructions (Signed)

## 2015-05-03 NOTE — Progress Notes (Signed)
Subjective:    Patient ID: Michelle Skinner, female    DOB: 08/13/1965,    Past Medical History  Diagnosis Date  . Diabetes mellitus, type II ([place])   . Hyperlipidemia   . Depression   . GERD (gastroesophageal reflux disease)   . Cervical cancer [facility]    Past Surgical History  Procedure Laterality Date  . Other surgical history      R Foot  . Abdominal hysterectomy     Social History   Social History  . Marital Status: Married    Spouse Name: N/A  . Number of Children: N/A  . Years of Education: N/A   Social History Main Topics  . Smoking status: Never Smoker   . Smokeless tobacco: None  . Alcohol Use: No  . Drug Use: No  . Sexual Activity: Not Asked   Other Topics Concern  . None   Social History Narrative   Outpatient Encounter Prescriptions as of 05/03/2015  Medication Sig  . insulin aspart (NOVOLOG FLEXPEN) 100 UNIT/ML FlexPen Inject 10 Units into the skin 3 (three) times daily with meals.  . Insulin Degludec (TRESIBA FLEXTOUCH) 200 UNIT/ML SOPN Inject 50 Units into the skin daily.  Marland Kitchen levothyroxine (SYNTHROID, LEVOTHROID) 50 MCG tablet Take 1 tablet (50 mcg total) by mouth daily.  . pregabalin (LYRICA) 75 MG capsule Take 75 mg by mouth 2 (two) times daily.  . rosuvastatin (CRESTOR) 10 MG tablet Take 1 tablet (10 mg total) by mouth daily.  . [DISCONTINUED] insulin aspart (NOVOLOG FLEXPEN) 100 UNIT/ML FlexPen Inject 15 Units into the skin 3 (three) times daily with meals.   No facility-administered encounter medications on file as of 05/03/2015.   ALLERGIES: Allergies  Allergen Reactions  . Bactrim [Sulfamethoxazole-Trimethoprim]   . Gabapentin   . Invokana [Canagliflozin]    VACCINATION STATUS:  There is no immunization history on file for this patient.  Diabetes She presents for her follow-up diabetic visit. She has type 2 diabetes mellitus. Onset time: She was diagnosed at approximate age of 26 years. Her disease course has been worsening. There  are no hypoglycemic associated symptoms. Pertinent negatives for hypoglycemia include no confusion, headaches, pallor or seizures. Associated symptoms include fatigue, polydipsia and polyuria. Pertinent negatives for diabetes include no chest pain and no polyphagia. There are no hypoglycemic complications. Symptoms are worsening. Diabetic complications include autonomic neuropathy and retinopathy. Risk factors for coronary artery disease include diabetes mellitus, dyslipidemia, hypertension, obesity and sedentary lifestyle. She is compliant with treatment most of the time. Her weight is stable. She is following a generally unhealthy diet. She has not had a previous visit with a dietitian. She never participates in exercise. Her home blood glucose trend is increasing steadily. Her overall blood glucose range is >200 mg/dl. An ACE inhibitor/angiotensin II receptor blocker is not being taken.  Hyperlipidemia This is a chronic problem. The current episode started more than 1 year ago. The problem is uncontrolled. Recent lipid tests were reviewed and are high. Exacerbating diseases include diabetes, hypothyroidism and obesity. Pertinent negatives include no chest pain, myalgias or shortness of breath. She is currently on no antihyperlipidemic treatment.  Thyroid Problem Presents for initial visit. Symptoms include fatigue. Patient reports no cold intolerance, diarrhea, heat intolerance or palpitations. The following procedures have not been performed: radioiodine uptake scan and thyroidectomy. Her past medical history is significant for diabetes and hyperlipidemia.       Review of Systems  Constitutional: Positive for fatigue. Negative for unexpected weight change.  HENT: Negative for trouble swallowing and voice change.   Eyes: Negative for visual disturbance.  Respiratory: Negative for cough, shortness of breath and wheezing.   Cardiovascular: Negative for chest pain, palpitations and leg swelling.   Gastrointestinal: Negative for nausea, vomiting and diarrhea.  Endocrine: Positive for polydipsia and polyuria. Negative for cold intolerance, heat intolerance and polyphagia.  Musculoskeletal: Negative for myalgias and arthralgias.  Skin: Negative for color change, pallor, rash and wound.  Neurological: Negative for seizures and headaches.  Psychiatric/Behavioral: Negative for suicidal ideas and confusion.    Objective:    BP 150/82 mmHg  Pulse 80  Ht 5' 7.5" (1.715 m)  Wt 300 lb (136.079 kg)  BMI 46.27 kg/m2  SpO2 95%  Wt Readings from Last 3 Encounters:  05/03/15 300 lb (136.079 kg)  04/25/15 292 lb (132.45 kg)    Physical Exam  Constitutional: She is oriented to person, place, and time. She appears well-developed.  She has poor hygiene generally.  HENT:  Head: Normocephalic and atraumatic.  She has poor dentition.  Eyes: EOM are normal.  Neck: Normal range of motion. Neck supple. No tracheal deviation present. No thyromegaly present.  Cardiovascular: Normal rate and regular rhythm.   Pulmonary/Chest: Effort normal and breath sounds normal.  Abdominal: Soft. Bowel sounds are normal. There is no tenderness. There is no guarding.  Musculoskeletal: Normal range of motion. She exhibits no edema.  Neurological: She is alert and oriented to person, place, and time. She has normal reflexes. No cranial nerve deficit. Coordination normal.  Skin: Skin is warm and dry. No rash noted. No erythema. No pallor.  Psychiatric: She has a normal mood and affect. Judgment normal.  Reluctant affect .    Results for orders placed or performed in visit on 04/25/15  TSH  Result Value Ref Range   TSH 5.551 (H) 0.350 - 4.500 uIU/mL  T4, free  Result Value Ref Range   Free T4 1.16 0.80 - 1.80 ng/dL  Hemoglobin A1c  Result Value Ref Range   Hgb A1c MFr Bld 12.4 (H) <5.7 %   Mean Plasma Glucose 309 (H) <117 mg/dL  Basic metabolic panel  Result Value Ref Range   Sodium 133 (L) 135 - 146  mmol/L   Potassium 5.0 3.5 - 5.3 mmol/L   Chloride 94 (L) 98 - 110 mmol/L   CO2 26 20 - 31 mmol/L   Glucose, Bld 466 (H) 65 - 99 mg/dL   BUN 14 7 - 25 mg/dL   Creat 0.95 0.50 - 1.10 mg/dL   Calcium 9.3 8.6 - 10.2 mg/dL  Lipid panel  Result Value Ref Range   Cholesterol 285 (H) 125 - 200 mg/dL   Triglycerides 208 (H) <150 mg/dL   HDL 43 (L) >=46 mg/dL   Total CHOL/HDL Ratio 6.6 (H) <=5.0 Ratio   VLDL 42 (H) <30 mg/dL   LDL Cholesterol 200 (H) <130 mg/dL   Complete Blood Count (Most recent): Lab Results  Component Value Date   WBC 5.4 01/01/2010   HGB 9.8* 01/01/2010   HCT 30.0* 01/01/2010   MCV 76.5* 01/01/2010   PLT 267 01/01/2010   Chemistry (most recent): Lab Results  Component Value Date   NA 133* 04/25/2015   K 5.0 04/25/2015   CL 94* 04/25/2015   CO2 26 04/25/2015   BUN 14 04/25/2015   CREATININE 0.95 04/25/2015   Diabetic Labs (most recent): Lab Results  Component Value Date   HGBA1C 12.4* 04/25/2015   Lipid profile (most recent): Lab  Results  Component Value Date   TRIG 208* 04/25/2015   CHOL 285* 04/25/2015         Assessment & Plan:   1. Uncontrolled type 2 diabetes mellitus with complication, with long-term current use of insulin ([place])   Her diabetes is complicated by neuropathy and possible retinopathy. - Patient has currently uncontrolled symptomatic type 2 DM of approximately since age 76 years duration, with most recent A1c of 12.7 %.    -Patient remains at a high risk for more acute and chronic complications of diabetes which include CAD, CVA, CKD, retinopathy, and neuropathy. These are all discussed in detail with the patient.  - I have counseled the patient on diet management and weight loss, by adopting a carbohydrate restricted/protein rich diet.  - Patient is advised to stick to a routine mealtimes to eat 3 meals  a day and avoid unnecessary snacks ( to snack only to correct hypoglycemia).   - Suggestion is made for patient to  avoid simple carbohydrates   from their diet including Cakes , Desserts, Ice Cream,  Soda (  diet and regular) , Sweet Tea , Candies,  Chips, Cookies, Artificial Sweeteners,   and "Sugar-free" Products . This will help patient to have stable blood glucose profile and potentially avoid unintended weight gain.  - I encouraged the patient to switch to  unprocessed or minimally processed complex starch and increased protein intake (animal or plant source), fruits, and vegetables.  - The patient will be scheduled with Jearld Fenton, RDN, CDE for individualized DM education.  - I have approached patient with the following individualized plan to manage diabetes and patient agrees:   - I  will proceed to increase  Tresiba to 50 units qhs,  Resume  Novolog 10 units 3 times a day before meals for pre-meal blood glucose above 90 mg/dL. -She is given individualized chart to correct hypoglycemia but 150 mg/dL. -She is advised to start strict monitoring of glucose  AC and HS. - Patient is warned not to take insulin without proper monitoring per orders.  -Patient is encouraged to call clinic for blood glucose levels less than 70 or above 300 mg /dl. - she is adamant saying she does not tolerate MTF, Invokana, and Byetta.  - Patient specific target  A1c;  LDL, HDL, Triglycerides, and  Waist Circumference were discussed in detail.  2) BP/HTN: unControlled. Continue support restriction, she is not on medications. 3) Lipids/HPL: LDL  Levels at 200, I initiated Crestor 10 mg by mouth daily at bedtime.  4)  Weight/Diet: CDE consult in progress, exercise, and carbohydrates information provided. She  - Amb ref to Medical Nutrition Therapy-MNT 5) hypothyroidism: New diagnosis, I would initiate levothyroxine 50 g by mouth every morning.  - We discussed about correct intake of levothyroxine, at fasting, with water, separated by at least 30 minutes from breakfast, and separated by more than 4 hours from calcium,  iron, multivitamins, acid reflux medications (PPIs). -Patient is made aware of the fact that thyroid hormone replacement is needed for life, dose to be adjusted by periodic monitoring of thyroid function tests.  6) Chronic Care/Health Maintenance:  -Patient isinitiated on  Statin medications. She is encouraged to continue to follow up with Ophthalmology, Podiatrist at least yearly or according to recommendations, and advised to stay away from smoking. I have recommended yearly flu vaccine and pneumonia vaccination at least every 5 years; moderate intensity exercise for up to 150 minutes weekly; and  sleep for at least 7  hours a day.   Patient to bring meter and  blood glucose logs during their next visit.   I advised patient to maintain close follow up with their PCP for primary care needs. Follow up plan: Return in about 3 months (around 08/03/2015) for diabetes, high blood pressure, high cholesterol, underactive thyroid.  Glade Lloyd, MD Phone: [phone]  Fax: [phone]   05/03/2015, 2:57 PM

## 2015-07-18 DIAGNOSIS — R928 Other abnormal and inconclusive findings on diagnostic imaging of breast: Secondary | ICD-10-CM | POA: Diagnosis not present

## 2015-07-18 DIAGNOSIS — Z1231 Encounter for screening mammogram for malignant neoplasm of breast: Secondary | ICD-10-CM | POA: Diagnosis not present

## 2015-07-25 ENCOUNTER — Other Ambulatory Visit: Payer: Self-pay | Admitting: "Endocrinology

## 2015-07-25 DIAGNOSIS — E1165 Type 2 diabetes mellitus with hyperglycemia: Secondary | ICD-10-CM | POA: Diagnosis not present

## 2015-07-25 DIAGNOSIS — Z794 Long term (current) use of insulin: Secondary | ICD-10-CM | POA: Diagnosis not present

## 2015-07-25 DIAGNOSIS — E119 Type 2 diabetes mellitus without complications: Secondary | ICD-10-CM | POA: Diagnosis not present

## 2015-07-25 DIAGNOSIS — E118 Type 2 diabetes mellitus with unspecified complications: Secondary | ICD-10-CM | POA: Diagnosis not present

## 2015-07-25 LAB — BASIC METABOLIC PANEL
BUN: 16 mg/dL (ref 7–25)
CO2: 28 mmol/L (ref 20–31)
CREATININE: 0.9 mg/dL (ref 0.50–1.10)
Calcium: 8.5 mg/dL — ABNORMAL LOW (ref 8.6–10.2)
Chloride: 101 mmol/L (ref 98–110)
Glucose, Bld: 144 mg/dL — ABNORMAL HIGH (ref 65–99)
Potassium: 3.9 mmol/L (ref 3.5–5.3)
SODIUM: 138 mmol/L (ref 135–146)

## 2015-07-26 LAB — HEMOGLOBIN A1C
HEMOGLOBIN A1C: 8.6 % — AB (ref <5.7)
MEAN PLASMA GLUCOSE: 200 mg/dL — AB (ref <117)

## 2015-08-03 ENCOUNTER — Encounter: Payer: Self-pay | Admitting: "Endocrinology

## 2015-08-03 ENCOUNTER — Ambulatory Visit (INDEPENDENT_AMBULATORY_CARE_PROVIDER_SITE_OTHER): Payer: Medicare Other | Admitting: "Endocrinology

## 2015-08-03 VITALS — BP 144/89 | HR 72 | Ht 67.5 in | Wt 309.0 lb

## 2015-08-03 DIAGNOSIS — IMO0002 Reserved for concepts with insufficient information to code with codable children: Secondary | ICD-10-CM

## 2015-08-03 DIAGNOSIS — E039 Hypothyroidism, unspecified: Secondary | ICD-10-CM | POA: Diagnosis not present

## 2015-08-03 DIAGNOSIS — E785 Hyperlipidemia, unspecified: Secondary | ICD-10-CM

## 2015-08-03 DIAGNOSIS — E118 Type 2 diabetes mellitus with unspecified complications: Secondary | ICD-10-CM | POA: Diagnosis not present

## 2015-08-03 DIAGNOSIS — Z794 Long term (current) use of insulin: Secondary | ICD-10-CM | POA: Diagnosis not present

## 2015-08-03 DIAGNOSIS — E1165 Type 2 diabetes mellitus with hyperglycemia: Secondary | ICD-10-CM

## 2015-08-03 MED ORDER — INSULIN DEGLUDEC 200 UNIT/ML ~~LOC~~ SOPN: 50.0000 [IU] | PEN_INJECTOR | Freq: Every day | SUBCUTANEOUS | Status: DC

## 2015-08-03 MED ORDER — LEVOTHYROXINE SODIUM 75 MCG PO TABS: 75.0000 ug | ORAL_TABLET | Freq: Every day | ORAL | Status: DC

## 2015-08-03 MED ORDER — INSULIN ASPART 100 UNIT/ML FLEXPEN: 10.0000 [IU] | PEN_INJECTOR | Freq: Three times a day (TID) | SUBCUTANEOUS | Status: DC

## 2015-08-03 NOTE — Progress Notes (Signed)
Subjective:    Patient ID: Michelle Skinner, female    DOB: 10/23/1965,    Past Medical History  Diagnosis Date  . Diabetes mellitus, type II ([place])   . Hyperlipidemia   . Depression   . GERD (gastroesophageal reflux disease)   . Cervical cancer [facility])    Past Surgical History  Procedure Laterality Date  . Other surgical history      R Foot  . Abdominal hysterectomy     Social History   Social History  . Marital Status: Married    Spouse Name: N/A  . Number of Children: N/A  . Years of Education: N/A   Social History Main Topics  . Smoking status: Never Smoker   . Smokeless tobacco: None  . Alcohol Use: No  . Drug Use: No  . Sexual Activity: Not Asked   Other Topics Concern  . None   Social History Narrative   Outpatient Encounter Prescriptions as of 08/03/2015  Medication Sig  . insulin aspart (NOVOLOG FLEXPEN) 100 UNIT/ML FlexPen Inject 10-16 Units into the skin 3 (three) times daily with meals.  . Insulin Degludec (TRESIBA FLEXTOUCH) 200 UNIT/ML SOPN Inject 50 Units into the skin daily.  Marland Kitchen levothyroxine (SYNTHROID, LEVOTHROID) 75 MCG tablet Take 1 tablet (75 mcg total) by mouth daily.  . pregabalin (LYRICA) 75 MG capsule Take 75 mg by mouth 2 (two) times daily.  . rosuvastatin (CRESTOR) 10 MG tablet Take 1 tablet (10 mg total) by mouth daily.  . [DISCONTINUED] insulin aspart (NOVOLOG FLEXPEN) 100 UNIT/ML FlexPen Inject 10 Units into the skin 3 (three) times daily with meals.  . [DISCONTINUED] Insulin Degludec (TRESIBA FLEXTOUCH) 200 UNIT/ML SOPN Inject 50 Units into the skin daily.  . [DISCONTINUED] levothyroxine (SYNTHROID, LEVOTHROID) 50 MCG tablet Take 1 tablet (50 mcg total) by mouth daily.   No facility-administered encounter medications on file as of 08/03/2015.   ALLERGIES: Allergies  Allergen Reactions  . Bactrim [Sulfamethoxazole-Trimethoprim]   . Gabapentin   . Invokana [Canagliflozin]    VACCINATION STATUS:  There is no immunization history  on file for this patient.  Diabetes She presents for her follow-up diabetic visit. She has type 2 diabetes mellitus. Onset time: She was diagnosed at approximate age of 35 years. Her disease course has been improving. There are no hypoglycemic associated symptoms. Pertinent negatives for hypoglycemia include no confusion, headaches, pallor or seizures. Associated symptoms include fatigue. Pertinent negatives for diabetes include no chest pain, no polydipsia, no polyphagia and no polyuria. There are no hypoglycemic complications. Symptoms are improving. Diabetic complications include peripheral neuropathy and retinopathy. Pertinent negatives for diabetic complications include no autonomic neuropathy. Risk factors for coronary artery disease include diabetes mellitus, dyslipidemia, hypertension, obesity and sedentary lifestyle. She is compliant with treatment most of the time. Her weight is stable. She is following a generally unhealthy diet. She has not had a previous visit with a dietitian (She missed her appointment.). She never participates in exercise. Her home blood glucose trend is increasing steadily. Her breakfast blood glucose range is generally >200 mg/dl. Her lunch blood glucose range is generally >200 mg/dl. Her dinner blood glucose range is generally >200 mg/dl. Her overall blood glucose range is >200 mg/dl. An ACE inhibitor/angiotensin II receptor blocker is not being taken. Eye exam is current.  Hyperlipidemia This is a chronic problem. The current episode started more than 1 year ago. The problem is uncontrolled. Recent lipid tests were reviewed and are high. Exacerbating diseases include diabetes, hypothyroidism and obesity.  Pertinent negatives include no chest pain, myalgias or shortness of breath. She is currently on no antihyperlipidemic treatment.  Thyroid Problem Presents for initial visit. Symptoms include fatigue. Patient reports no cold intolerance, diarrhea, heat intolerance or  palpitations. The following procedures have not been performed: radioiodine uptake scan and thyroidectomy. Her past medical history is significant for diabetes and hyperlipidemia.       Review of Systems  Constitutional: Positive for fatigue. Negative for unexpected weight change.  HENT: Negative for trouble swallowing and voice change.   Eyes: Negative for visual disturbance.  Respiratory: Negative for cough, shortness of breath and wheezing.   Cardiovascular: Negative for chest pain, palpitations and leg swelling.  Gastrointestinal: Negative for nausea, vomiting and diarrhea.  Endocrine: Negative for cold intolerance, heat intolerance, polydipsia, polyphagia and polyuria.  Musculoskeletal: Negative for myalgias and arthralgias.  Skin: Negative for color change, pallor, rash and wound.  Neurological: Negative for seizures and headaches.  Psychiatric/Behavioral: Negative for suicidal ideas and confusion.    Objective:    BP 144/89 mmHg  Pulse 72  Ht 5' 7.5" (1.715 m)  Wt 309 lb (140.161 kg)  BMI 47.65 kg/m2  SpO2 95%  Wt Readings from Last 3 Encounters:  08/03/15 309 lb (140.161 kg)  05/03/15 300 lb (136.079 kg)  04/25/15 292 lb (132.45 kg)    Physical Exam  Constitutional: She is oriented to person, place, and time. She appears well-developed.  She has poor hygiene generally.  HENT:  Head: Normocephalic and atraumatic.  She has poor dentition.  Eyes: EOM are normal.  Neck: Normal range of motion. Neck supple. No tracheal deviation present. No thyromegaly present.  Cardiovascular: Normal rate and regular rhythm.   Pulmonary/Chest: Effort normal and breath sounds normal.  Abdominal: Soft. Bowel sounds are normal. There is no tenderness. There is no guarding.  Musculoskeletal: Normal range of motion. She exhibits no edema.  Neurological: She is alert and oriented to person, place, and time. She has normal reflexes. No cranial nerve deficit. Coordination normal.  Skin: Skin  is warm and dry. No rash noted. No erythema. No pallor.  Psychiatric: She has a normal mood and affect. Judgment normal.  Reluctant affect .    Results for orders placed or performed in visit on 0000000  Basic metabolic panel  Result Value Ref Range   Sodium 138 135 - 146 mmol/L   Potassium 3.9 3.5 - 5.3 mmol/L   Chloride 101 98 - 110 mmol/L   CO2 28 20 - 31 mmol/L   Glucose, Bld 144 (H) 65 - 99 mg/dL   BUN 16 7 - 25 mg/dL   Creat 0.90 0.50 - 1.10 mg/dL   Calcium 8.5 (L) 8.6 - 10.2 mg/dL  Hemoglobin A1c  Result Value Ref Range   Hgb A1c MFr Bld 8.6 (H) <5.7 %   Mean Plasma Glucose 200 (H) <117 mg/dL   Diabetic Labs (most recent): Lab Results  Component Value Date   HGBA1C 8.6* 07/25/2015   HGBA1C 12.4* 04/25/2015    Lipid Panel     Component Value Date/Time   CHOL 285* 04/25/2015 1001   TRIG 208* 04/25/2015 1001   HDL 43* 04/25/2015 1001   CHOLHDL 6.6* 04/25/2015 1001   VLDL 42* 04/25/2015 1001   LDLCALC 200* 04/25/2015 1001     Assessment & Plan:   1. Uncontrolled type 2 diabetes mellitus with complication, with long-term current use of insulin ([place])   Her diabetes is complicated by neuropathy and possible retinopathy. - Patient has currently  uncontrolled symptomatic type 2 DM of approximately since age 25 years duration, with most recent A1c improved to 8.6% from 12.7 %.    -Patient remains at a high risk for more acute and chronic complications of diabetes which include CAD, CVA, CKD, retinopathy, and neuropathy. These are all discussed in detail with the patient.  - I have counseled the patient on diet management and weight loss, by adopting a carbohydrate restricted/protein rich diet.  - Patient is advised to stick to a routine mealtimes to eat 3 meals  a day and avoid unnecessary snacks ( to snack only to correct hypoglycemia).   - Suggestion is made for patient to avoid simple carbohydrates   from their diet including Cakes , Desserts, Ice Cream,  Soda (   diet and regular) , Sweet Tea , Candies,  Chips, Cookies, Artificial Sweeteners,   and "Sugar-free" Products . This will help patient to have stable blood glucose profile and potentially avoid unintended weight gain.  - I encouraged the patient to switch to  unprocessed or minimally processed complex starch and increased protein intake (animal or plant source), fruits, and vegetables.  - The patient will be scheduled with Jearld Fenton, RDN, CDE for individualized DM education.  - I have approached patient with the following individualized plan to manage diabetes and patient agrees:   - I  will proceed to increase  Tresiba to 50 units qhs,   continue Novolog 10 units 3 times a day before meals for pre-meal blood glucose above 90 mg/dL. -She is given individualized chart to correct hypoglycemia but 150 mg/dL. -She is advised to start strict monitoring of glucose  AC and HS. - Patient is warned not to take insulin without proper monitoring per orders.  -Patient is encouraged to call clinic for blood glucose levels less than 70 or above 300 mg /dl. - she is adamant saying she does not tolerate MTF, Invokana, and Byetta.  - Patient specific target  A1c;  LDL, HDL, Triglycerides, and  Waist Circumference were discussed in detail.  2) BP/HTN: unControlled. Continue support restriction, she is not on medications. 3) Lipids/HPL: LDL  Levels at 200, I initiated Crestor 10 mg by mouth daily at bedtime.  4)  Weight/Diet: CDE consult in progress, exercise, and carbohydrates information provided. She  - Amb ref to Medical Nutrition Therapy-MNT 5) hypothyroidism: -Responding to treatment, I will increase levothyroxine to 75 g by mouth every morning.  - We discussed about correct intake of levothyroxine, at fasting, with water, separated by at least 30 minutes from breakfast, and separated by more than 4 hours from calcium, iron, multivitamins, acid reflux medications (PPIs). -Patient is made aware of  the fact that thyroid hormone replacement is needed for life, dose to be adjusted by periodic monitoring of thyroid function tests.  6) Chronic Care/Health Maintenance:  -Patient isinitiated on  Statin medications. She is encouraged to continue to follow up with Ophthalmology, Podiatrist at least yearly or according to recommendations, and advised to stay away from smoking. I have recommended yearly flu vaccine and pneumonia vaccination at least every 5 years; moderate intensity exercise for up to 150 minutes weekly; and  sleep for at least 7 hours a day.   Patient to bring meter and  blood glucose logs during their next visit.   I advised patient to maintain close follow up with their PCP for primary care needs. Follow up plan: Return in about 3 months (around 11/01/2015) for diabetes, high blood pressure, high cholesterol, underactive  thyroid, follow up with pre-visit labs, meter, and logs.  Glade Lloyd, MD Phone: [phone]  Fax: [phone]   08/03/2015, 3:21 PM

## 2015-08-03 NOTE — Patient Instructions (Signed)

## 2015-08-16 DIAGNOSIS — N63 Unspecified lump in breast: Secondary | ICD-10-CM | POA: Diagnosis not present

## 2015-08-16 DIAGNOSIS — N6082 Other benign mammary dysplasias of left breast: Secondary | ICD-10-CM | POA: Diagnosis not present

## 2015-08-16 DIAGNOSIS — N6002 Solitary cyst of left breast: Secondary | ICD-10-CM | POA: Diagnosis not present

## 2015-08-30 ENCOUNTER — Other Ambulatory Visit: Payer: Self-pay | Admitting: "Endocrinology

## 2015-09-11 ENCOUNTER — Encounter: Payer: Self-pay | Admitting: Nutrition

## 2015-09-11 ENCOUNTER — Other Ambulatory Visit: Payer: Self-pay

## 2015-09-11 ENCOUNTER — Encounter: Payer: Medicare Other | Attending: Internal Medicine | Admitting: Nutrition

## 2015-09-11 VITALS — Ht 67.5 in | Wt 323.0 lb

## 2015-09-11 DIAGNOSIS — IMO0002 Reserved for concepts with insufficient information to code with codable children: Secondary | ICD-10-CM

## 2015-09-11 DIAGNOSIS — Z794 Long term (current) use of insulin: Secondary | ICD-10-CM | POA: Diagnosis not present

## 2015-09-11 DIAGNOSIS — E1165 Type 2 diabetes mellitus with hyperglycemia: Secondary | ICD-10-CM

## 2015-09-11 DIAGNOSIS — E118 Type 2 diabetes mellitus with unspecified complications: Secondary | ICD-10-CM | POA: Diagnosis not present

## 2015-09-11 MED ORDER — INSULIN DEGLUDEC 200 UNIT/ML ~~LOC~~ SOPN: 50.0000 [IU] | PEN_INJECTOR | Freq: Every day | SUBCUTANEOUS | Status: DC

## 2015-09-11 NOTE — Progress Notes (Signed)
  Medical Nutrition Therapy:  Appt start time: [id number] end time:  1630.  Assessment:  Primary concerns today: Diabetes. Lives with her husband. Most recent A1C  8.6%. Tresiba 50 units at night and Humalog 10 units with meals with sliding scale. Eats 1-2 meals per day. Physical actvity: ADL. Works at [employer] at [employer]. Is on diability. Unable to stand on feet for long periods of time. Sometimes skips meals and sometimes skips doses of insulin. Diet is inconsistent and not meeting her nutrititional needs. Has been referred to local food pantries. Testing 4 times per day.  Diet needs more fresh fruits, vegetables and whole grains and reduce processed foods.  Lab Results  Component Value Date   HGBA1C 8.6* 07/25/2015     Preferred Learning Style:  No preference indicated   Learning Readiness:  Ready  Change in progress   MEDICATIONS: See lis   DIETARY INTAKE: .    24-hr recall:  B ( AM): Skipped  Snk ( AM):   L ( PM): Double cheese burger and french fries and Mt. DR. Malachi Bonds. Snk ( PM): ramen noodles  D ( PM): Hamburger, onion rings and water Snk ( PM): none Beverages: Sweet tea and soda, water  Usual physical activity: ADL.  Estimated energy needs: 1500 calories 170 g carbohydrates 112 g protein 42 g fat  Progress Towards Goal(s):  In progress.   Nutritional Diagnosis:  NB-1.1 Food and nutrition-related knowledge deficit As related to DM.  As evidenced by A1C >8%..    Intervention:  Nutrition and Diabetes education provided on My Plate, CHO counting, meal planning, portion sizes, timing of meals, avoiding snacks between meals unless having a low blood sugar, target ranges for A1C and blood sugars, signs/symptoms and treatment of hyper/hypoglycemia, monitoring blood sugars, taking medications as prescribed, benefits of exercising 30 minutes per day and prevention of complications of DM.  Goals 1. Follow My Plate 2. Eat more salads 3. Don't skip meals 4. Avoid  ramen noodles. 5. Test blood sugars before meals.  Teaching Method Utilized:  Visual Auditory Hands on  Handouts given during visit include:  Plate Method  Meal Plan Card  Diabetes Instructions   Barriers to learning/adherence to lifestyle change:  none  Demonstrated degree of understanding via:  Teach Back   Monitoring/Evaluation:  Dietary intake, exercise, meal planning, SBG, and body weight in 1 month(s).

## 2015-09-11 NOTE — Patient Instructions (Signed)
Goals 1. Follow My Plate 2. Eat more salads 3. Don't skip meals 4. Avoid ramen noodles. 5. Test blood sugars before meals.

## 2015-09-14 DIAGNOSIS — E119 Type 2 diabetes mellitus without complications: Secondary | ICD-10-CM | POA: Diagnosis not present

## 2015-09-14 DIAGNOSIS — E7800 Pure hypercholesterolemia, unspecified: Secondary | ICD-10-CM | POA: Diagnosis not present

## 2015-09-20 DIAGNOSIS — I10 Essential (primary) hypertension: Secondary | ICD-10-CM | POA: Diagnosis not present

## 2015-09-20 DIAGNOSIS — M1990 Unspecified osteoarthritis, unspecified site: Secondary | ICD-10-CM | POA: Diagnosis not present

## 2015-09-20 DIAGNOSIS — M79606 Pain in leg, unspecified: Secondary | ICD-10-CM | POA: Diagnosis not present

## 2015-09-20 DIAGNOSIS — E1140 Type 2 diabetes mellitus with diabetic neuropathy, unspecified: Secondary | ICD-10-CM | POA: Diagnosis not present

## 2015-10-11 ENCOUNTER — Other Ambulatory Visit: Payer: Self-pay

## 2015-10-11 MED ORDER — ROSUVASTATIN CALCIUM 10 MG PO TABS: ORAL_TABLET | ORAL | Status: DC

## 2015-10-12 ENCOUNTER — Ambulatory Visit: Payer: Medicare Other | Admitting: Nutrition

## 2015-10-30 DIAGNOSIS — E11319 Type 2 diabetes mellitus with unspecified diabetic retinopathy without macular edema: Secondary | ICD-10-CM | POA: Diagnosis not present

## 2015-11-06 ENCOUNTER — Other Ambulatory Visit: Payer: Self-pay | Admitting: "Endocrinology

## 2015-11-06 DIAGNOSIS — E039 Hypothyroidism, unspecified: Secondary | ICD-10-CM | POA: Diagnosis not present

## 2015-11-06 DIAGNOSIS — Z794 Long term (current) use of insulin: Secondary | ICD-10-CM | POA: Diagnosis not present

## 2015-11-06 DIAGNOSIS — E118 Type 2 diabetes mellitus with unspecified complications: Secondary | ICD-10-CM | POA: Diagnosis not present

## 2015-11-06 DIAGNOSIS — E1165 Type 2 diabetes mellitus with hyperglycemia: Secondary | ICD-10-CM | POA: Diagnosis not present

## 2015-11-06 LAB — HEMOGLOBIN A1C
Hgb A1c MFr Bld: 8.4 % — ABNORMAL HIGH (ref <5.7)
Mean Plasma Glucose: 194 mg/dL

## 2015-11-06 LAB — TSH: TSH: 2.25 m[IU]/L

## 2015-11-06 LAB — BASIC METABOLIC PANEL
BUN: 14 mg/dL (ref 7–25)
CHLORIDE: 106 mmol/L (ref 98–110)
CO2: 26 mmol/L (ref 20–31)
CREATININE: 0.67 mg/dL (ref 0.50–1.10)
Calcium: 9.1 mg/dL (ref 8.6–10.2)
GLUCOSE: 95 mg/dL (ref 65–99)
Potassium: 4.4 mmol/L (ref 3.5–5.3)
Sodium: 142 mmol/L (ref 135–146)

## 2015-11-06 LAB — T4, FREE: FREE T4: 1.1 ng/dL (ref 0.8–1.8)

## 2015-11-07 DIAGNOSIS — E119 Type 2 diabetes mellitus without complications: Secondary | ICD-10-CM | POA: Diagnosis not present

## 2015-11-07 DIAGNOSIS — E7800 Pure hypercholesterolemia, unspecified: Secondary | ICD-10-CM | POA: Diagnosis not present

## 2015-11-13 ENCOUNTER — Ambulatory Visit: Payer: Medicare Other | Admitting: "Endocrinology

## 2015-11-20 DIAGNOSIS — Z789 Other specified health status: Secondary | ICD-10-CM | POA: Diagnosis not present

## 2015-11-20 DIAGNOSIS — Z299 Encounter for prophylactic measures, unspecified: Secondary | ICD-10-CM | POA: Diagnosis not present

## 2015-11-20 DIAGNOSIS — M25572 Pain in left ankle and joints of left foot: Secondary | ICD-10-CM | POA: Diagnosis not present

## 2015-11-20 DIAGNOSIS — M79606 Pain in leg, unspecified: Secondary | ICD-10-CM | POA: Diagnosis not present

## 2015-11-22 ENCOUNTER — Encounter: Payer: Self-pay | Admitting: "Endocrinology

## 2015-11-22 ENCOUNTER — Ambulatory Visit (INDEPENDENT_AMBULATORY_CARE_PROVIDER_SITE_OTHER): Payer: Medicare Other | Admitting: "Endocrinology

## 2015-11-22 VITALS — BP 135/75 | HR 76 | Wt 327.0 lb

## 2015-11-22 DIAGNOSIS — E785 Hyperlipidemia, unspecified: Secondary | ICD-10-CM | POA: Diagnosis not present

## 2015-11-22 DIAGNOSIS — E118 Type 2 diabetes mellitus with unspecified complications: Secondary | ICD-10-CM

## 2015-11-22 DIAGNOSIS — Z794 Long term (current) use of insulin: Secondary | ICD-10-CM

## 2015-11-22 DIAGNOSIS — E039 Hypothyroidism, unspecified: Secondary | ICD-10-CM

## 2015-11-22 DIAGNOSIS — E1165 Type 2 diabetes mellitus with hyperglycemia: Secondary | ICD-10-CM | POA: Diagnosis not present

## 2015-11-22 DIAGNOSIS — IMO0002 Reserved for concepts with insufficient information to code with codable children: Secondary | ICD-10-CM

## 2015-11-22 NOTE — Progress Notes (Signed)
Subjective:    Patient ID: Michelle Skinner, female    DOB: 1966-05-25,    Past Medical History  Diagnosis Date  . Diabetes mellitus, type II ([place])   . Hyperlipidemia   . Depression   . GERD (gastroesophageal reflux disease)   . Cervical cancer [facility])    Past Surgical History  Procedure Laterality Date  . Other surgical history      R Foot  . Abdominal hysterectomy     Social History   Social History  . Marital Status: Married    Spouse Name: N/A  . Number of Children: N/A  . Years of Education: N/A   Social History Main Topics  . Smoking status: Never Smoker   . Smokeless tobacco: None  . Alcohol Use: No  . Drug Use: No  . Sexual Activity: Not Asked   Other Topics Concern  . None   Social History Narrative   Outpatient Encounter Prescriptions as of 11/22/2015  Medication Sig  . insulin aspart (NOVOLOG FLEXPEN) 100 UNIT/ML FlexPen Inject 10-16 Units into the skin 3 (three) times daily with meals.  . Insulin Degludec (TRESIBA FLEXTOUCH) 200 UNIT/ML SOPN Inject 50 Units into the skin daily.  Marland Kitchen levothyroxine (SYNTHROID, LEVOTHROID) 75 MCG tablet Take 1 tablet (75 mcg total) by mouth daily.  . pregabalin (LYRICA) 75 MG capsule Take 75 mg by mouth 2 (two) times daily.  . rosuvastatin (CRESTOR) 10 MG tablet TAKE 1 TABLET (10 MG TOTAL) BY MOUTH DAILY.   No facility-administered encounter medications on file as of 11/22/2015.   ALLERGIES: Allergies  Allergen Reactions  . Bactrim [Sulfamethoxazole-Trimethoprim]   . Gabapentin   . Invokana [Canagliflozin]    VACCINATION STATUS:  There is no immunization history on file for this patient.  Diabetes She presents for her follow-up diabetic visit. She has type 2 diabetes mellitus. Onset time: She was diagnosed at approximate age of 50 years. Her disease course has been improving. There are no hypoglycemic associated symptoms. Pertinent negatives for hypoglycemia include no confusion, headaches, pallor or seizures.  Associated symptoms include fatigue. Pertinent negatives for diabetes include no chest pain, no polydipsia, no polyphagia and no polyuria. There are no hypoglycemic complications. Symptoms are improving. Diabetic complications include peripheral neuropathy and retinopathy. Pertinent negatives for diabetic complications include no autonomic neuropathy. Risk factors for coronary artery disease include diabetes mellitus, dyslipidemia, hypertension, obesity and sedentary lifestyle. She is compliant with treatment most of the time. Her weight is increasing steadily. She is following a generally unhealthy diet. She has not had a previous visit with a dietitian (She missed her appointment.). She never participates in exercise. Her home blood glucose trend is decreasing steadily. Her breakfast blood glucose range is generally 180-200 mg/dl. Her lunch blood glucose range is generally 180-200 mg/dl. Her dinner blood glucose range is generally 180-200 mg/dl. Her overall blood glucose range is 180-200 mg/dl. An ACE inhibitor/angiotensin II receptor blocker is not being taken. Eye exam is current.  Hyperlipidemia This is a chronic problem. The current episode started more than 1 year ago. The problem is uncontrolled. Recent lipid tests were reviewed and are high. Exacerbating diseases include diabetes, hypothyroidism and obesity. Pertinent negatives include no chest pain, myalgias or shortness of breath. She is currently on no antihyperlipidemic treatment.  Thyroid Problem Presents for initial visit. Symptoms include fatigue. Patient reports no cold intolerance, diarrhea, heat intolerance or palpitations. The following procedures have not been performed: radioiodine uptake scan and thyroidectomy. Her past medical history is significant  for diabetes and hyperlipidemia.       Review of Systems  Constitutional: Positive for fatigue. Negative for unexpected weight change.  HENT: Negative for trouble swallowing and voice  change.   Eyes: Negative for visual disturbance.  Respiratory: Negative for cough, shortness of breath and wheezing.   Cardiovascular: Negative for chest pain, palpitations and leg swelling.  Gastrointestinal: Negative for nausea, vomiting and diarrhea.  Endocrine: Negative for cold intolerance, heat intolerance, polydipsia, polyphagia and polyuria.  Musculoskeletal: Negative for myalgias and arthralgias.  Skin: Negative for color change, pallor, rash and wound.  Neurological: Negative for seizures and headaches.  Psychiatric/Behavioral: Negative for suicidal ideas and confusion.    Objective:    BP 135/75 mmHg  Pulse 76  Wt 327 lb (148.326 kg)  SpO2 97%  Wt Readings from Last 3 Encounters:  11/22/15 327 lb (148.326 kg)  09/11/15 323 lb (146.512 kg)  08/03/15 309 lb (140.161 kg)    Physical Exam  Constitutional: She is oriented to person, place, and time. She appears well-developed.  She has poor hygiene generally.  HENT:  Head: Normocephalic and atraumatic.  She has poor dentition.  Eyes: EOM are normal.  Neck: Normal range of motion. Neck supple. No tracheal deviation present. No thyromegaly present.  Cardiovascular: Normal rate and regular rhythm.   Pulmonary/Chest: Effort normal and breath sounds normal.  Abdominal: Soft. Bowel sounds are normal. There is no tenderness. There is no guarding.  Musculoskeletal: Normal range of motion. She exhibits no edema.  Neurological: She is alert and oriented to person, place, and time. She has normal reflexes. No cranial nerve deficit. Coordination normal.  Skin: Skin is warm and dry. No rash noted. No erythema. No pallor.  Psychiatric: She has a normal mood and affect. Judgment normal.  Reluctant affect .    Results for orders placed or performed in visit on [id number]  Basic metabolic panel  Result Value Ref Range   Sodium 142 135 - 146 mmol/L   Potassium 4.4 3.5 - 5.3 mmol/L   Chloride 106 98 - 110 mmol/L   CO2 26 20 - 31  mmol/L   Glucose, Bld 95 65 - 99 mg/dL   BUN 14 7 - 25 mg/dL   Creat 0.67 0.50 - 1.10 mg/dL   Calcium 9.1 8.6 - 10.2 mg/dL  TSH  Result Value Ref Range   TSH 2.25 mIU/L  T4, free  Result Value Ref Range   Free T4 1.1 0.8 - 1.8 ng/dL  Hemoglobin A1c  Result Value Ref Range   Hgb A1c MFr Bld 8.4 (H) <5.7 %   Mean Plasma Glucose 194 mg/dL   Diabetic Labs (most recent): Lab Results  Component Value Date   HGBA1C 8.4* 11/06/2015   HGBA1C 8.6* 07/25/2015   HGBA1C 12.4* 04/25/2015    Lipid Panel     Component Value Date/Time   CHOL 285* 04/25/2015 1001   TRIG 208* 04/25/2015 1001   HDL 43* 04/25/2015 1001   CHOLHDL 6.6* 04/25/2015 1001   VLDL 42* 04/25/2015 1001   LDLCALC 200* 04/25/2015 1001     Assessment & Plan:   1. Uncontrolled type 2 diabetes mellitus with complication, with long-term current use of insulin ([place])   Her diabetes is complicated by neuropathy and possible retinopathy. - Patient has currently uncontrolled symptomatic type 2 DM of approximately since age 43 years duration, with most recent A1c improved to 8.4% from 12.7 %.    -Patient remains at a high risk for more acute and chronic  complications of diabetes which include CAD, CVA, CKD, retinopathy, and neuropathy. These are all discussed in detail with the patient.  - I have counseled the patient on diet management and weight loss, by adopting a carbohydrate restricted/protein rich diet.  - Patient is advised to stick to a routine mealtimes to eat 3 meals  a day and avoid unnecessary snacks ( to snack only to correct hypoglycemia).   - Suggestion is made for patient to avoid simple carbohydrates   from their diet including Cakes , Desserts, Ice Cream,  Soda (  diet and regular) , Sweet Tea , Candies,  Chips, Cookies, Artificial Sweeteners,   and "Sugar-free" Products . This will help patient to have stable blood glucose profile and potentially avoid unintended weight gain.  - I encouraged the patient  to switch to  unprocessed or minimally processed complex starch and increased protein intake (animal or plant source), fruits, and vegetables.  - The patient will be scheduled with Jearld Fenton, RDN, CDE for individualized DM education.  - I have approached patient with the following individualized plan to manage diabetes and patient agrees:   - I  will proceed with  Tresiba  50 units qhs,   continue Novolog 10 units 3 times a day before meals for pre-meal blood glucose above 90 mg/dL. -She is given individualized chart to correct hypoglycemia but 150 mg/dL. -She is advised to start strict monitoring of glucose  AC and HS. - Patient is warned not to take insulin without proper monitoring per orders.  -Patient is encouraged to call clinic for blood glucose levels less than 70 or above 300 mg /dl. - she is adamant saying she does not tolerate MTF, Invokana, and Byetta.  - Patient specific target  A1c;  LDL, HDL, Triglycerides, and  Waist Circumference were discussed in detail.  2) BP/HTN: unControlled. Continue support restriction, she is not on medications. 3) Lipids/HPL: LDL  Levels at 200, I initiated Crestor 10 mg by mouth daily at bedtime.  4)  Weight/Diet: CDE consult in progress, exercise, and carbohydrates information provided. She   5) hypothyroidism: -Responding to treatment, I will continue levothyroxine 75 g by mouth every morning.  - We discussed about correct intake of levothyroxine, at fasting, with water, separated by at least 30 minutes from breakfast, and separated by more than 4 hours from calcium, iron, multivitamins, acid reflux medications (PPIs). -Patient is made aware of the fact that thyroid hormone replacement is needed for life, dose to be adjusted by periodic monitoring of thyroid function tests.  6) Chronic Care/Health Maintenance:  -Patient isinitiated on  Statin medications. She is encouraged to continue to follow up with Ophthalmology, Podiatrist at  least yearly or according to recommendations, and advised to stay away from smoking. I have recommended yearly flu vaccine and pneumonia vaccination at least every 5 years; moderate intensity exercise for up to 150 minutes weekly; and  sleep for at least 7 hours a day.   Patient to bring meter and  blood glucose logs during their next visit.   I advised patient to maintain close follow up with their PCP for primary care needs. Follow up plan: Return in about 3 months (around 02/22/2016) for diabetes, high blood pressure, high cholesterol, follow up with pre-visit labs, meter, and logs.  Glade Lloyd, MD Phone: [phone]  Fax: [phone]   11/22/2015, 3:11 PM

## 2015-11-22 NOTE — Patient Instructions (Signed)

## 2015-11-27 DIAGNOSIS — S9032XA Contusion of left foot, initial encounter: Secondary | ICD-10-CM | POA: Diagnosis not present

## 2015-12-30 DIAGNOSIS — E11621 Type 2 diabetes mellitus with foot ulcer: Secondary | ICD-10-CM | POA: Diagnosis not present

## 2015-12-30 DIAGNOSIS — N390 Urinary tract infection, site not specified: Secondary | ICD-10-CM | POA: Diagnosis not present

## 2015-12-30 DIAGNOSIS — K3184 Gastroparesis: Secondary | ICD-10-CM | POA: Diagnosis not present

## 2015-12-30 DIAGNOSIS — Z6841 Body Mass Index (BMI) 40.0 and over, adult: Secondary | ICD-10-CM | POA: Diagnosis not present

## 2015-12-30 DIAGNOSIS — E1165 Type 2 diabetes mellitus with hyperglycemia: Secondary | ICD-10-CM | POA: Diagnosis not present

## 2015-12-30 DIAGNOSIS — L97529 Non-pressure chronic ulcer of other part of left foot with unspecified severity: Secondary | ICD-10-CM | POA: Diagnosis not present

## 2015-12-31 DIAGNOSIS — E11621 Type 2 diabetes mellitus with foot ulcer: Secondary | ICD-10-CM | POA: Diagnosis not present

## 2015-12-31 DIAGNOSIS — E1165 Type 2 diabetes mellitus with hyperglycemia: Secondary | ICD-10-CM | POA: Diagnosis not present

## 2015-12-31 DIAGNOSIS — N390 Urinary tract infection, site not specified: Secondary | ICD-10-CM | POA: Diagnosis not present

## 2015-12-31 DIAGNOSIS — Z6841 Body Mass Index (BMI) 40.0 and over, adult: Secondary | ICD-10-CM | POA: Diagnosis not present

## 2016-01-01 DIAGNOSIS — K2970 Gastritis, unspecified, without bleeding: Secondary | ICD-10-CM | POA: Diagnosis not present

## 2016-01-01 DIAGNOSIS — Z794 Long term (current) use of insulin: Secondary | ICD-10-CM | POA: Diagnosis not present

## 2016-01-01 DIAGNOSIS — N390 Urinary tract infection, site not specified: Secondary | ICD-10-CM | POA: Diagnosis not present

## 2016-01-01 DIAGNOSIS — R112 Nausea with vomiting, unspecified: Secondary | ICD-10-CM | POA: Diagnosis not present

## 2016-01-01 DIAGNOSIS — E11621 Type 2 diabetes mellitus with foot ulcer: Secondary | ICD-10-CM | POA: Diagnosis not present

## 2016-01-01 DIAGNOSIS — L97509 Non-pressure chronic ulcer of other part of unspecified foot with unspecified severity: Secondary | ICD-10-CM | POA: Diagnosis not present

## 2016-01-01 DIAGNOSIS — L03032 Cellulitis of left toe: Secondary | ICD-10-CM | POA: Diagnosis not present

## 2016-01-01 DIAGNOSIS — Z6841 Body Mass Index (BMI) 40.0 and over, adult: Secondary | ICD-10-CM | POA: Diagnosis not present

## 2016-01-01 DIAGNOSIS — E13621 Other specified diabetes mellitus with foot ulcer: Secondary | ICD-10-CM | POA: Diagnosis not present

## 2016-01-01 DIAGNOSIS — E1165 Type 2 diabetes mellitus with hyperglycemia: Secondary | ICD-10-CM | POA: Diagnosis not present

## 2016-01-02 DIAGNOSIS — L97509 Non-pressure chronic ulcer of other part of unspecified foot with unspecified severity: Secondary | ICD-10-CM | POA: Diagnosis not present

## 2016-01-02 DIAGNOSIS — F329 Major depressive disorder, single episode, unspecified: Secondary | ICD-10-CM | POA: Diagnosis present

## 2016-01-02 DIAGNOSIS — Z882 Allergy status to sulfonamides status: Secondary | ICD-10-CM | POA: Diagnosis not present

## 2016-01-02 DIAGNOSIS — Z79899 Other long term (current) drug therapy: Secondary | ICD-10-CM | POA: Diagnosis not present

## 2016-01-02 DIAGNOSIS — E1165 Type 2 diabetes mellitus with hyperglycemia: Secondary | ICD-10-CM | POA: Diagnosis present

## 2016-01-02 DIAGNOSIS — L97529 Non-pressure chronic ulcer of other part of left foot with unspecified severity: Secondary | ICD-10-CM | POA: Diagnosis present

## 2016-01-02 DIAGNOSIS — K2900 Acute gastritis without bleeding: Secondary | ICD-10-CM | POA: Diagnosis not present

## 2016-01-02 DIAGNOSIS — Z6841 Body Mass Index (BMI) 40.0 and over, adult: Secondary | ICD-10-CM | POA: Diagnosis not present

## 2016-01-02 DIAGNOSIS — F419 Anxiety disorder, unspecified: Secondary | ICD-10-CM | POA: Diagnosis present

## 2016-01-02 DIAGNOSIS — Z91018 Allergy to other foods: Secondary | ICD-10-CM | POA: Diagnosis not present

## 2016-01-02 DIAGNOSIS — Z803 Family history of malignant neoplasm of breast: Secondary | ICD-10-CM | POA: Diagnosis not present

## 2016-01-02 DIAGNOSIS — L03032 Cellulitis of left toe: Secondary | ICD-10-CM | POA: Diagnosis present

## 2016-01-02 DIAGNOSIS — R112 Nausea with vomiting, unspecified: Secondary | ICD-10-CM | POA: Diagnosis not present

## 2016-01-02 DIAGNOSIS — K2970 Gastritis, unspecified, without bleeding: Secondary | ICD-10-CM | POA: Diagnosis not present

## 2016-01-02 DIAGNOSIS — Z8041 Family history of malignant neoplasm of ovary: Secondary | ICD-10-CM | POA: Diagnosis not present

## 2016-01-02 DIAGNOSIS — F4310 Post-traumatic stress disorder, unspecified: Secondary | ICD-10-CM | POA: Diagnosis present

## 2016-01-02 DIAGNOSIS — E11621 Type 2 diabetes mellitus with foot ulcer: Secondary | ICD-10-CM | POA: Diagnosis present

## 2016-01-02 DIAGNOSIS — J45909 Unspecified asthma, uncomplicated: Secondary | ICD-10-CM | POA: Diagnosis present

## 2016-01-02 DIAGNOSIS — Z833 Family history of diabetes mellitus: Secondary | ICD-10-CM | POA: Diagnosis not present

## 2016-01-02 DIAGNOSIS — N390 Urinary tract infection, site not specified: Secondary | ICD-10-CM | POA: Diagnosis present

## 2016-01-02 DIAGNOSIS — R110 Nausea: Secondary | ICD-10-CM | POA: Diagnosis not present

## 2016-01-02 DIAGNOSIS — E11628 Type 2 diabetes mellitus with other skin complications: Secondary | ICD-10-CM | POA: Diagnosis present

## 2016-01-02 DIAGNOSIS — K2960 Other gastritis without bleeding: Secondary | ICD-10-CM | POA: Diagnosis present

## 2016-01-02 DIAGNOSIS — Z8541 Personal history of malignant neoplasm of cervix uteri: Secondary | ICD-10-CM | POA: Diagnosis not present

## 2016-01-02 DIAGNOSIS — K3184 Gastroparesis: Secondary | ICD-10-CM | POA: Diagnosis present

## 2016-01-02 DIAGNOSIS — E119 Type 2 diabetes mellitus without complications: Secondary | ICD-10-CM | POA: Diagnosis not present

## 2016-01-02 DIAGNOSIS — B9689 Other specified bacterial agents as the cause of diseases classified elsewhere: Secondary | ICD-10-CM | POA: Diagnosis present

## 2016-01-02 DIAGNOSIS — M1990 Unspecified osteoarthritis, unspecified site: Secondary | ICD-10-CM | POA: Diagnosis present

## 2016-01-02 DIAGNOSIS — E1143 Type 2 diabetes mellitus with diabetic autonomic (poly)neuropathy: Secondary | ICD-10-CM | POA: Diagnosis present

## 2016-01-02 DIAGNOSIS — Z794 Long term (current) use of insulin: Secondary | ICD-10-CM | POA: Diagnosis not present

## 2016-01-03 DIAGNOSIS — K2900 Acute gastritis without bleeding: Secondary | ICD-10-CM | POA: Diagnosis not present

## 2016-01-05 DIAGNOSIS — E11621 Type 2 diabetes mellitus with foot ulcer: Secondary | ICD-10-CM | POA: Diagnosis not present

## 2016-01-05 DIAGNOSIS — F4310 Post-traumatic stress disorder, unspecified: Secondary | ICD-10-CM | POA: Diagnosis not present

## 2016-01-05 DIAGNOSIS — L97529 Non-pressure chronic ulcer of other part of left foot with unspecified severity: Secondary | ICD-10-CM | POA: Diagnosis not present

## 2016-01-05 DIAGNOSIS — I10 Essential (primary) hypertension: Secondary | ICD-10-CM | POA: Diagnosis not present

## 2016-01-05 DIAGNOSIS — L03032 Cellulitis of left toe: Secondary | ICD-10-CM | POA: Diagnosis not present

## 2016-01-05 DIAGNOSIS — F341 Dysthymic disorder: Secondary | ICD-10-CM | POA: Diagnosis not present

## 2016-01-05 DIAGNOSIS — Z794 Long term (current) use of insulin: Secondary | ICD-10-CM | POA: Diagnosis not present

## 2016-01-05 DIAGNOSIS — Z4800 Encounter for change or removal of nonsurgical wound dressing: Secondary | ICD-10-CM | POA: Diagnosis not present

## 2016-01-05 DIAGNOSIS — Z5181 Encounter for therapeutic drug level monitoring: Secondary | ICD-10-CM | POA: Diagnosis not present

## 2016-01-05 DIAGNOSIS — M1990 Unspecified osteoarthritis, unspecified site: Secondary | ICD-10-CM | POA: Diagnosis not present

## 2016-01-08 DIAGNOSIS — L97529 Non-pressure chronic ulcer of other part of left foot with unspecified severity: Secondary | ICD-10-CM | POA: Diagnosis not present

## 2016-01-08 DIAGNOSIS — I10 Essential (primary) hypertension: Secondary | ICD-10-CM | POA: Diagnosis not present

## 2016-01-08 DIAGNOSIS — M1990 Unspecified osteoarthritis, unspecified site: Secondary | ICD-10-CM | POA: Diagnosis not present

## 2016-01-08 DIAGNOSIS — F4310 Post-traumatic stress disorder, unspecified: Secondary | ICD-10-CM | POA: Diagnosis not present

## 2016-01-08 DIAGNOSIS — E11621 Type 2 diabetes mellitus with foot ulcer: Secondary | ICD-10-CM | POA: Diagnosis not present

## 2016-01-08 DIAGNOSIS — L03032 Cellulitis of left toe: Secondary | ICD-10-CM | POA: Diagnosis not present

## 2016-01-10 DIAGNOSIS — F4310 Post-traumatic stress disorder, unspecified: Secondary | ICD-10-CM | POA: Diagnosis not present

## 2016-01-10 DIAGNOSIS — I10 Essential (primary) hypertension: Secondary | ICD-10-CM | POA: Diagnosis not present

## 2016-01-10 DIAGNOSIS — E11621 Type 2 diabetes mellitus with foot ulcer: Secondary | ICD-10-CM | POA: Diagnosis not present

## 2016-01-10 DIAGNOSIS — L03032 Cellulitis of left toe: Secondary | ICD-10-CM | POA: Diagnosis not present

## 2016-01-10 DIAGNOSIS — M1990 Unspecified osteoarthritis, unspecified site: Secondary | ICD-10-CM | POA: Diagnosis not present

## 2016-01-10 DIAGNOSIS — L97529 Non-pressure chronic ulcer of other part of left foot with unspecified severity: Secondary | ICD-10-CM | POA: Diagnosis not present

## 2016-01-11 DIAGNOSIS — E1143 Type 2 diabetes mellitus with diabetic autonomic (poly)neuropathy: Secondary | ICD-10-CM | POA: Diagnosis not present

## 2016-01-11 DIAGNOSIS — Z09 Encounter for follow-up examination after completed treatment for conditions other than malignant neoplasm: Secondary | ICD-10-CM | POA: Diagnosis not present

## 2016-01-11 DIAGNOSIS — E11621 Type 2 diabetes mellitus with foot ulcer: Secondary | ICD-10-CM | POA: Diagnosis not present

## 2016-01-11 DIAGNOSIS — K2950 Unspecified chronic gastritis without bleeding: Secondary | ICD-10-CM | POA: Diagnosis not present

## 2016-01-16 ENCOUNTER — Other Ambulatory Visit: Payer: Self-pay

## 2016-01-16 MED ORDER — LEVOTHYROXINE SODIUM 75 MCG PO TABS
75.0000 ug | ORAL_TABLET | Freq: Every day | ORAL | Status: DC
Start: 2016-01-16 — End: 2016-03-27

## 2016-01-17 DIAGNOSIS — F4310 Post-traumatic stress disorder, unspecified: Secondary | ICD-10-CM | POA: Diagnosis not present

## 2016-01-17 DIAGNOSIS — E11621 Type 2 diabetes mellitus with foot ulcer: Secondary | ICD-10-CM | POA: Diagnosis not present

## 2016-01-17 DIAGNOSIS — L03032 Cellulitis of left toe: Secondary | ICD-10-CM | POA: Diagnosis not present

## 2016-01-17 DIAGNOSIS — M1990 Unspecified osteoarthritis, unspecified site: Secondary | ICD-10-CM | POA: Diagnosis not present

## 2016-01-17 DIAGNOSIS — I10 Essential (primary) hypertension: Secondary | ICD-10-CM | POA: Diagnosis not present

## 2016-01-17 DIAGNOSIS — L97529 Non-pressure chronic ulcer of other part of left foot with unspecified severity: Secondary | ICD-10-CM | POA: Diagnosis not present

## 2016-01-22 DIAGNOSIS — E11621 Type 2 diabetes mellitus with foot ulcer: Secondary | ICD-10-CM | POA: Diagnosis not present

## 2016-01-22 DIAGNOSIS — L97522 Non-pressure chronic ulcer of other part of left foot with fat layer exposed: Secondary | ICD-10-CM | POA: Diagnosis not present

## 2016-01-25 DIAGNOSIS — L97529 Non-pressure chronic ulcer of other part of left foot with unspecified severity: Secondary | ICD-10-CM | POA: Diagnosis not present

## 2016-01-25 DIAGNOSIS — L03032 Cellulitis of left toe: Secondary | ICD-10-CM | POA: Diagnosis not present

## 2016-01-25 DIAGNOSIS — M1990 Unspecified osteoarthritis, unspecified site: Secondary | ICD-10-CM | POA: Diagnosis not present

## 2016-01-25 DIAGNOSIS — E11621 Type 2 diabetes mellitus with foot ulcer: Secondary | ICD-10-CM | POA: Diagnosis not present

## 2016-01-25 DIAGNOSIS — F4310 Post-traumatic stress disorder, unspecified: Secondary | ICD-10-CM | POA: Diagnosis not present

## 2016-01-25 DIAGNOSIS — I10 Essential (primary) hypertension: Secondary | ICD-10-CM | POA: Diagnosis not present

## 2016-01-30 DIAGNOSIS — L97529 Non-pressure chronic ulcer of other part of left foot with unspecified severity: Secondary | ICD-10-CM | POA: Diagnosis not present

## 2016-01-30 DIAGNOSIS — I10 Essential (primary) hypertension: Secondary | ICD-10-CM | POA: Diagnosis not present

## 2016-01-30 DIAGNOSIS — F4310 Post-traumatic stress disorder, unspecified: Secondary | ICD-10-CM | POA: Diagnosis not present

## 2016-01-30 DIAGNOSIS — M1990 Unspecified osteoarthritis, unspecified site: Secondary | ICD-10-CM | POA: Diagnosis not present

## 2016-01-30 DIAGNOSIS — E11621 Type 2 diabetes mellitus with foot ulcer: Secondary | ICD-10-CM | POA: Diagnosis not present

## 2016-01-30 DIAGNOSIS — L97522 Non-pressure chronic ulcer of other part of left foot with fat layer exposed: Secondary | ICD-10-CM | POA: Diagnosis not present

## 2016-01-30 DIAGNOSIS — I890 Lymphedema, not elsewhere classified: Secondary | ICD-10-CM | POA: Diagnosis not present

## 2016-01-30 DIAGNOSIS — L03032 Cellulitis of left toe: Secondary | ICD-10-CM | POA: Diagnosis not present

## 2016-01-30 DIAGNOSIS — Z881 Allergy status to other antibiotic agents status: Secondary | ICD-10-CM | POA: Diagnosis not present

## 2016-01-30 DIAGNOSIS — E1140 Type 2 diabetes mellitus with diabetic neuropathy, unspecified: Secondary | ICD-10-CM | POA: Diagnosis not present

## 2016-01-30 DIAGNOSIS — Z882 Allergy status to sulfonamides status: Secondary | ICD-10-CM | POA: Diagnosis not present

## 2016-02-02 DIAGNOSIS — M1990 Unspecified osteoarthritis, unspecified site: Secondary | ICD-10-CM | POA: Diagnosis not present

## 2016-02-02 DIAGNOSIS — L03032 Cellulitis of left toe: Secondary | ICD-10-CM | POA: Diagnosis not present

## 2016-02-02 DIAGNOSIS — F4310 Post-traumatic stress disorder, unspecified: Secondary | ICD-10-CM | POA: Diagnosis not present

## 2016-02-02 DIAGNOSIS — E11621 Type 2 diabetes mellitus with foot ulcer: Secondary | ICD-10-CM | POA: Diagnosis not present

## 2016-02-02 DIAGNOSIS — L97529 Non-pressure chronic ulcer of other part of left foot with unspecified severity: Secondary | ICD-10-CM | POA: Diagnosis not present

## 2016-02-02 DIAGNOSIS — I10 Essential (primary) hypertension: Secondary | ICD-10-CM | POA: Diagnosis not present

## 2016-02-06 DIAGNOSIS — L03032 Cellulitis of left toe: Secondary | ICD-10-CM | POA: Diagnosis not present

## 2016-02-06 DIAGNOSIS — F4310 Post-traumatic stress disorder, unspecified: Secondary | ICD-10-CM | POA: Diagnosis not present

## 2016-02-06 DIAGNOSIS — L97529 Non-pressure chronic ulcer of other part of left foot with unspecified severity: Secondary | ICD-10-CM | POA: Diagnosis not present

## 2016-02-06 DIAGNOSIS — I10 Essential (primary) hypertension: Secondary | ICD-10-CM | POA: Diagnosis not present

## 2016-02-06 DIAGNOSIS — M1990 Unspecified osteoarthritis, unspecified site: Secondary | ICD-10-CM | POA: Diagnosis not present

## 2016-02-06 DIAGNOSIS — E11621 Type 2 diabetes mellitus with foot ulcer: Secondary | ICD-10-CM | POA: Diagnosis not present

## 2016-02-13 DIAGNOSIS — I890 Lymphedema, not elsewhere classified: Secondary | ICD-10-CM | POA: Diagnosis not present

## 2016-02-13 DIAGNOSIS — L97522 Non-pressure chronic ulcer of other part of left foot with fat layer exposed: Secondary | ICD-10-CM | POA: Diagnosis not present

## 2016-02-13 DIAGNOSIS — S91205A Unspecified open wound of left lesser toe(s) with damage to nail, initial encounter: Secondary | ICD-10-CM | POA: Diagnosis not present

## 2016-02-13 DIAGNOSIS — L97529 Non-pressure chronic ulcer of other part of left foot with unspecified severity: Secondary | ICD-10-CM | POA: Diagnosis not present

## 2016-02-13 DIAGNOSIS — E11621 Type 2 diabetes mellitus with foot ulcer: Secondary | ICD-10-CM | POA: Diagnosis not present

## 2016-02-15 DIAGNOSIS — F4310 Post-traumatic stress disorder, unspecified: Secondary | ICD-10-CM | POA: Diagnosis not present

## 2016-02-15 DIAGNOSIS — M1990 Unspecified osteoarthritis, unspecified site: Secondary | ICD-10-CM | POA: Diagnosis not present

## 2016-02-15 DIAGNOSIS — L97529 Non-pressure chronic ulcer of other part of left foot with unspecified severity: Secondary | ICD-10-CM | POA: Diagnosis not present

## 2016-02-15 DIAGNOSIS — E11621 Type 2 diabetes mellitus with foot ulcer: Secondary | ICD-10-CM | POA: Diagnosis not present

## 2016-02-15 DIAGNOSIS — L03032 Cellulitis of left toe: Secondary | ICD-10-CM | POA: Diagnosis not present

## 2016-02-15 DIAGNOSIS — I10 Essential (primary) hypertension: Secondary | ICD-10-CM | POA: Diagnosis not present

## 2016-02-20 DIAGNOSIS — L03032 Cellulitis of left toe: Secondary | ICD-10-CM | POA: Diagnosis not present

## 2016-02-20 DIAGNOSIS — F4310 Post-traumatic stress disorder, unspecified: Secondary | ICD-10-CM | POA: Diagnosis not present

## 2016-02-20 DIAGNOSIS — I10 Essential (primary) hypertension: Secondary | ICD-10-CM | POA: Diagnosis not present

## 2016-02-20 DIAGNOSIS — E11621 Type 2 diabetes mellitus with foot ulcer: Secondary | ICD-10-CM | POA: Diagnosis not present

## 2016-02-20 DIAGNOSIS — L97529 Non-pressure chronic ulcer of other part of left foot with unspecified severity: Secondary | ICD-10-CM | POA: Diagnosis not present

## 2016-02-20 DIAGNOSIS — M1990 Unspecified osteoarthritis, unspecified site: Secondary | ICD-10-CM | POA: Diagnosis not present

## 2016-02-28 DIAGNOSIS — L97529 Non-pressure chronic ulcer of other part of left foot with unspecified severity: Secondary | ICD-10-CM | POA: Diagnosis not present

## 2016-02-28 DIAGNOSIS — S91205A Unspecified open wound of left lesser toe(s) with damage to nail, initial encounter: Secondary | ICD-10-CM | POA: Diagnosis not present

## 2016-02-28 DIAGNOSIS — E11621 Type 2 diabetes mellitus with foot ulcer: Secondary | ICD-10-CM | POA: Diagnosis not present

## 2016-02-28 DIAGNOSIS — I890 Lymphedema, not elsewhere classified: Secondary | ICD-10-CM | POA: Diagnosis not present

## 2016-02-28 DIAGNOSIS — L97522 Non-pressure chronic ulcer of other part of left foot with fat layer exposed: Secondary | ICD-10-CM | POA: Diagnosis not present

## 2016-02-29 DIAGNOSIS — M1990 Unspecified osteoarthritis, unspecified site: Secondary | ICD-10-CM | POA: Diagnosis not present

## 2016-02-29 DIAGNOSIS — L03032 Cellulitis of left toe: Secondary | ICD-10-CM | POA: Diagnosis not present

## 2016-02-29 DIAGNOSIS — F4310 Post-traumatic stress disorder, unspecified: Secondary | ICD-10-CM | POA: Diagnosis not present

## 2016-02-29 DIAGNOSIS — I10 Essential (primary) hypertension: Secondary | ICD-10-CM | POA: Diagnosis not present

## 2016-02-29 DIAGNOSIS — E11621 Type 2 diabetes mellitus with foot ulcer: Secondary | ICD-10-CM | POA: Diagnosis not present

## 2016-02-29 DIAGNOSIS — L97529 Non-pressure chronic ulcer of other part of left foot with unspecified severity: Secondary | ICD-10-CM | POA: Diagnosis not present

## 2016-03-05 DIAGNOSIS — F4310 Post-traumatic stress disorder, unspecified: Secondary | ICD-10-CM | POA: Diagnosis not present

## 2016-03-05 DIAGNOSIS — L97529 Non-pressure chronic ulcer of other part of left foot with unspecified severity: Secondary | ICD-10-CM | POA: Diagnosis not present

## 2016-03-05 DIAGNOSIS — Z4800 Encounter for change or removal of nonsurgical wound dressing: Secondary | ICD-10-CM | POA: Diagnosis not present

## 2016-03-05 DIAGNOSIS — Z794 Long term (current) use of insulin: Secondary | ICD-10-CM | POA: Diagnosis not present

## 2016-03-05 DIAGNOSIS — E11621 Type 2 diabetes mellitus with foot ulcer: Secondary | ICD-10-CM | POA: Diagnosis not present

## 2016-03-05 DIAGNOSIS — I10 Essential (primary) hypertension: Secondary | ICD-10-CM | POA: Diagnosis not present

## 2016-03-05 DIAGNOSIS — Z5181 Encounter for therapeutic drug level monitoring: Secondary | ICD-10-CM | POA: Diagnosis not present

## 2016-03-05 DIAGNOSIS — F341 Dysthymic disorder: Secondary | ICD-10-CM | POA: Diagnosis not present

## 2016-03-05 DIAGNOSIS — M1990 Unspecified osteoarthritis, unspecified site: Secondary | ICD-10-CM | POA: Diagnosis not present

## 2016-03-13 ENCOUNTER — Other Ambulatory Visit: Payer: Self-pay

## 2016-03-13 ENCOUNTER — Other Ambulatory Visit: Payer: Self-pay | Admitting: "Endocrinology

## 2016-03-13 ENCOUNTER — Ambulatory Visit: Payer: Medicare Other | Admitting: "Endocrinology

## 2016-03-13 DIAGNOSIS — M1990 Unspecified osteoarthritis, unspecified site: Secondary | ICD-10-CM | POA: Diagnosis not present

## 2016-03-13 DIAGNOSIS — E118 Type 2 diabetes mellitus with unspecified complications: Secondary | ICD-10-CM | POA: Diagnosis not present

## 2016-03-13 DIAGNOSIS — L97529 Non-pressure chronic ulcer of other part of left foot with unspecified severity: Secondary | ICD-10-CM | POA: Diagnosis not present

## 2016-03-13 DIAGNOSIS — Z794 Long term (current) use of insulin: Secondary | ICD-10-CM | POA: Diagnosis not present

## 2016-03-13 DIAGNOSIS — F341 Dysthymic disorder: Secondary | ICD-10-CM | POA: Diagnosis not present

## 2016-03-13 DIAGNOSIS — I10 Essential (primary) hypertension: Secondary | ICD-10-CM | POA: Diagnosis not present

## 2016-03-13 DIAGNOSIS — E1165 Type 2 diabetes mellitus with hyperglycemia: Secondary | ICD-10-CM | POA: Diagnosis not present

## 2016-03-13 DIAGNOSIS — F4310 Post-traumatic stress disorder, unspecified: Secondary | ICD-10-CM | POA: Diagnosis not present

## 2016-03-13 DIAGNOSIS — E11621 Type 2 diabetes mellitus with foot ulcer: Secondary | ICD-10-CM | POA: Diagnosis not present

## 2016-03-13 MED ORDER — GLUCOSE BLOOD VI STRP: ORAL_STRIP | 5 refills | Status: DC

## 2016-03-14 ENCOUNTER — Other Ambulatory Visit: Payer: Self-pay | Admitting: "Endocrinology

## 2016-03-14 DIAGNOSIS — M1990 Unspecified osteoarthritis, unspecified site: Secondary | ICD-10-CM | POA: Diagnosis not present

## 2016-03-14 DIAGNOSIS — F4310 Post-traumatic stress disorder, unspecified: Secondary | ICD-10-CM | POA: Diagnosis not present

## 2016-03-14 DIAGNOSIS — L97529 Non-pressure chronic ulcer of other part of left foot with unspecified severity: Secondary | ICD-10-CM | POA: Diagnosis not present

## 2016-03-14 DIAGNOSIS — L97522 Non-pressure chronic ulcer of other part of left foot with fat layer exposed: Secondary | ICD-10-CM | POA: Diagnosis not present

## 2016-03-14 DIAGNOSIS — I890 Lymphedema, not elsewhere classified: Secondary | ICD-10-CM | POA: Diagnosis not present

## 2016-03-14 DIAGNOSIS — F341 Dysthymic disorder: Secondary | ICD-10-CM | POA: Diagnosis not present

## 2016-03-14 DIAGNOSIS — I10 Essential (primary) hypertension: Secondary | ICD-10-CM | POA: Diagnosis not present

## 2016-03-14 DIAGNOSIS — S91205A Unspecified open wound of left lesser toe(s) with damage to nail, initial encounter: Secondary | ICD-10-CM | POA: Diagnosis not present

## 2016-03-14 DIAGNOSIS — E11621 Type 2 diabetes mellitus with foot ulcer: Secondary | ICD-10-CM | POA: Diagnosis not present

## 2016-03-14 LAB — COMPLETE METABOLIC PANEL WITH GFR
ALK PHOS: 66 U/L (ref 33–115)
ALT: 10 U/L (ref 6–29)
AST: 13 U/L (ref 10–35)
Albumin: 3.2 g/dL — ABNORMAL LOW (ref 3.6–5.1)
BILIRUBIN TOTAL: 0.6 mg/dL (ref 0.2–1.2)
BUN: 11 mg/dL (ref 7–25)
CO2: 28 mmol/L (ref 20–31)
Calcium: 9 mg/dL (ref 8.6–10.2)
Chloride: 102 mmol/L (ref 98–110)
Creat: 0.89 mg/dL (ref 0.50–1.10)
GFR, EST AFRICAN AMERICAN: 89 mL/min (ref >=60)
GFR, EST NON AFRICAN AMERICAN: 77 mL/min (ref >=60)
Glucose, Bld: 197 mg/dL — ABNORMAL HIGH (ref 65–99)
Potassium: 4.1 mmol/L (ref 3.5–5.3)
Sodium: 138 mmol/L (ref 135–146)
TOTAL PROTEIN: 6.8 g/dL (ref 6.1–8.1)

## 2016-03-14 LAB — HEMOGLOBIN A1C
Hgb A1c MFr Bld: 10 % — ABNORMAL HIGH (ref <5.7)
MEAN PLASMA GLUCOSE: 240 mg/dL

## 2016-03-19 DIAGNOSIS — S91205A Unspecified open wound of left lesser toe(s) with damage to nail, initial encounter: Secondary | ICD-10-CM | POA: Diagnosis not present

## 2016-03-19 DIAGNOSIS — M1990 Unspecified osteoarthritis, unspecified site: Secondary | ICD-10-CM | POA: Diagnosis not present

## 2016-03-19 DIAGNOSIS — I890 Lymphedema, not elsewhere classified: Secondary | ICD-10-CM | POA: Diagnosis not present

## 2016-03-19 DIAGNOSIS — F341 Dysthymic disorder: Secondary | ICD-10-CM | POA: Diagnosis not present

## 2016-03-19 DIAGNOSIS — L97522 Non-pressure chronic ulcer of other part of left foot with fat layer exposed: Secondary | ICD-10-CM | POA: Diagnosis not present

## 2016-03-19 DIAGNOSIS — I10 Essential (primary) hypertension: Secondary | ICD-10-CM | POA: Diagnosis not present

## 2016-03-19 DIAGNOSIS — L97529 Non-pressure chronic ulcer of other part of left foot with unspecified severity: Secondary | ICD-10-CM | POA: Diagnosis not present

## 2016-03-19 DIAGNOSIS — F4310 Post-traumatic stress disorder, unspecified: Secondary | ICD-10-CM | POA: Diagnosis not present

## 2016-03-19 DIAGNOSIS — E11621 Type 2 diabetes mellitus with foot ulcer: Secondary | ICD-10-CM | POA: Diagnosis not present

## 2016-03-20 DIAGNOSIS — L03116 Cellulitis of left lower limb: Secondary | ICD-10-CM | POA: Diagnosis not present

## 2016-03-20 DIAGNOSIS — Z299 Encounter for prophylactic measures, unspecified: Secondary | ICD-10-CM | POA: Diagnosis not present

## 2016-03-20 DIAGNOSIS — Z6841 Body Mass Index (BMI) 40.0 and over, adult: Secondary | ICD-10-CM | POA: Diagnosis not present

## 2016-03-20 DIAGNOSIS — E11621 Type 2 diabetes mellitus with foot ulcer: Secondary | ICD-10-CM | POA: Diagnosis not present

## 2016-03-20 DIAGNOSIS — L97509 Non-pressure chronic ulcer of other part of unspecified foot with unspecified severity: Secondary | ICD-10-CM | POA: Diagnosis not present

## 2016-03-20 DIAGNOSIS — E1165 Type 2 diabetes mellitus with hyperglycemia: Secondary | ICD-10-CM | POA: Diagnosis not present

## 2016-03-26 DIAGNOSIS — S91205A Unspecified open wound of left lesser toe(s) with damage to nail, initial encounter: Secondary | ICD-10-CM | POA: Diagnosis not present

## 2016-03-26 DIAGNOSIS — I890 Lymphedema, not elsewhere classified: Secondary | ICD-10-CM | POA: Diagnosis not present

## 2016-03-26 DIAGNOSIS — L97522 Non-pressure chronic ulcer of other part of left foot with fat layer exposed: Secondary | ICD-10-CM | POA: Diagnosis not present

## 2016-03-26 DIAGNOSIS — E11621 Type 2 diabetes mellitus with foot ulcer: Secondary | ICD-10-CM | POA: Diagnosis not present

## 2016-03-26 DIAGNOSIS — L97529 Non-pressure chronic ulcer of other part of left foot with unspecified severity: Secondary | ICD-10-CM | POA: Diagnosis not present

## 2016-03-27 ENCOUNTER — Encounter: Payer: Self-pay | Admitting: "Endocrinology

## 2016-03-27 ENCOUNTER — Ambulatory Visit (INDEPENDENT_AMBULATORY_CARE_PROVIDER_SITE_OTHER): Payer: Medicare Other | Admitting: "Endocrinology

## 2016-03-27 VITALS — BP 146/75 | HR 91 | Ht 67.5 in | Wt 327.0 lb

## 2016-03-27 DIAGNOSIS — E039 Hypothyroidism, unspecified: Secondary | ICD-10-CM | POA: Diagnosis not present

## 2016-03-27 DIAGNOSIS — Z794 Long term (current) use of insulin: Secondary | ICD-10-CM

## 2016-03-27 DIAGNOSIS — E118 Type 2 diabetes mellitus with unspecified complications: Secondary | ICD-10-CM

## 2016-03-27 DIAGNOSIS — E785 Hyperlipidemia, unspecified: Secondary | ICD-10-CM

## 2016-03-27 DIAGNOSIS — E1165 Type 2 diabetes mellitus with hyperglycemia: Secondary | ICD-10-CM

## 2016-03-27 DIAGNOSIS — IMO0002 Reserved for concepts with insufficient information to code with codable children: Secondary | ICD-10-CM

## 2016-03-27 MED ORDER — INSULIN ASPART 100 UNIT/ML FLEXPEN: 15.0000 [IU] | PEN_INJECTOR | Freq: Three times a day (TID) | SUBCUTANEOUS | 1 refills | Status: DC

## 2016-03-27 MED ORDER — LEVOTHYROXINE SODIUM 100 MCG PO TABS: 100.0000 ug | ORAL_TABLET | Freq: Every day | ORAL | 4 refills | Status: DC

## 2016-03-27 MED ORDER — INSULIN DEGLUDEC 200 UNIT/ML ~~LOC~~ SOPN: 60.0000 [IU] | PEN_INJECTOR | Freq: Every day | SUBCUTANEOUS | 2 refills | Status: DC

## 2016-03-27 NOTE — Patient Instructions (Signed)

## 2016-03-27 NOTE — Progress Notes (Signed)
Subjective:    Patient ID: Michelle Skinner, female    DOB: 09-22-1965,    Past Medical History:  Diagnosis Date  . Cervical cancer ([place])   . Depression   . Diabetes mellitus, type II ([place])   . GERD (gastroesophageal reflux disease)   . Hyperlipidemia    Past Surgical History:  Procedure Laterality Date  . ABDOMINAL HYSTERECTOMY    . OTHER SURGICAL HISTORY     R Foot   Social History   Social History  . Marital status: Married    Spouse name: N/A  . Number of children: N/A  . Years of education: N/A   Social History Main Topics  . Smoking status: Never Smoker  . Smokeless tobacco: Not on file  . Alcohol use No  . Drug use: No  . Sexual activity: Not on file   Other Topics Concern  . Not on file   Social History Narrative  . No narrative on file   Outpatient Encounter Prescriptions as of 03/27/2016  Medication Sig  . glucose blood test strip Use as instructed 4 x daily. E11.65  . insulin aspart (NOVOLOG FLEXPEN) 100 UNIT/ML FlexPen Inject 15-21 Units into the skin 3 (three) times daily with meals.  . Insulin Degludec (TRESIBA FLEXTOUCH) 200 UNIT/ML SOPN Inject 60 Units into the skin daily.  Marland Kitchen levothyroxine (SYNTHROID, LEVOTHROID) 100 MCG tablet Take 1 tablet (100 mcg total) by mouth daily.  . pregabalin (LYRICA) 75 MG capsule Take 75 mg by mouth 2 (two) times daily.  . rosuvastatin (CRESTOR) 10 MG tablet TAKE 1 TABLET (10 MG TOTAL) BY MOUTH DAILY.  . [DISCONTINUED] insulin aspart (NOVOLOG FLEXPEN) 100 UNIT/ML FlexPen Inject 10-16 Units into the skin 3 (three) times daily with meals.  . [DISCONTINUED] Insulin Degludec (TRESIBA FLEXTOUCH) 200 UNIT/ML SOPN Inject 50 Units into the skin daily.  . [DISCONTINUED] levothyroxine (SYNTHROID, LEVOTHROID) 75 MCG tablet Take 1 tablet (75 mcg total) by mouth daily.   No facility-administered encounter medications on file as of 03/27/2016.    ALLERGIES: Allergies  Allergen Reactions  . Bactrim  [Sulfamethoxazole-Trimethoprim]   . Gabapentin   . Invokana [Canagliflozin]    VACCINATION STATUS:  There is no immunization history on file for this patient.  Diabetes  She presents for her follow-up diabetic visit. She has type 2 diabetes mellitus. Onset time: She was diagnosed at approximate age of 50 years. Her disease course has been worsening. There are no hypoglycemic associated symptoms. Pertinent negatives for hypoglycemia include no confusion, headaches, pallor or seizures. Associated symptoms include fatigue. Pertinent negatives for diabetes include no chest pain, no polydipsia, no polyphagia and no polyuria. There are no hypoglycemic complications. Symptoms are improving. Diabetic complications include peripheral neuropathy and retinopathy. Pertinent negatives for diabetic complications include no autonomic neuropathy. Risk factors for coronary artery disease include diabetes mellitus, dyslipidemia, hypertension, obesity and sedentary lifestyle. She is compliant with treatment most of the time. Her weight is increasing steadily. She is following a generally unhealthy diet. She has not had a previous visit with a dietitian (She missed her appointment.). She never participates in exercise. Her home blood glucose trend is increasing steadily. Her breakfast blood glucose range is generally >200 mg/dl. Her lunch blood glucose range is generally >200 mg/dl. Her dinner blood glucose range is generally >200 mg/dl. Her overall blood glucose range is >200 mg/dl. An ACE inhibitor/angiotensin II receptor blocker is not being taken. Eye exam is current.  Hyperlipidemia  This is a chronic problem. The current  episode started more than 1 year ago. The problem is uncontrolled. Recent lipid tests were reviewed and are high. Exacerbating diseases include diabetes, hypothyroidism and obesity. Pertinent negatives include no chest pain, myalgias or shortness of breath. She is currently on no antihyperlipidemic  treatment.  Thyroid Problem  Presents for initial visit. Symptoms include fatigue. Patient reports no cold intolerance, diarrhea, heat intolerance or palpitations. The following procedures have not been performed: radioiodine uptake scan and thyroidectomy. Her past medical history is significant for diabetes and hyperlipidemia.     Review of Systems  Constitutional: Positive for fatigue. Negative for unexpected weight change.  HENT: Negative for trouble swallowing and voice change.   Eyes: Negative for visual disturbance.  Respiratory: Negative for cough, shortness of breath and wheezing.   Cardiovascular: Negative for chest pain, palpitations and leg swelling.  Gastrointestinal: Negative for diarrhea, nausea and vomiting.  Endocrine: Negative for cold intolerance, heat intolerance, polydipsia, polyphagia and polyuria.  Musculoskeletal: Negative for arthralgias and myalgias.  Skin: Negative for color change, pallor, rash and wound.  Neurological: Negative for seizures and headaches.  Psychiatric/Behavioral: Negative for confusion and suicidal ideas.    Objective:    BP (!) 146/75   Pulse 91   Ht 5' 7.5" (1.715 m)   Wt (!) 327 lb (148.3 kg)   BMI 50.46 kg/m   Wt Readings from Last 3 Encounters:  03/27/16 (!) 327 lb (148.3 kg)  11/22/15 (!) 327 lb (148.3 kg)  09/11/15 (!) 323 lb (146.5 kg)    Physical Exam  Constitutional: She is oriented to person, place, and time. She appears well-developed.  She has poor hygiene generally.  HENT:  Head: Normocephalic and atraumatic.  She has poor dentition.  Eyes: EOM are normal.  Neck: Normal range of motion. Neck supple. No tracheal deviation present. No thyromegaly present.  Cardiovascular: Normal rate and regular rhythm.   Pulmonary/Chest: Effort normal and breath sounds normal.  Abdominal: Soft. Bowel sounds are normal. There is no tenderness. There is no guarding.  Musculoskeletal: Normal range of motion. She exhibits no edema.   Neurological: She is alert and oriented to person, place, and time. She has normal reflexes. No cranial nerve deficit. Coordination normal.  Skin: Skin is warm and dry. No rash noted. No erythema. No pallor.  Psychiatric: She has a normal mood and affect. Judgment normal.  Reluctant affect .    Results for orders placed or performed in visit on 03/13/16  COMPLETE METABOLIC PANEL WITH GFR  Result Value Ref Range   Sodium 138 135 - 146 mmol/L   Potassium 4.1 3.5 - 5.3 mmol/L   Chloride 102 98 - 110 mmol/L   CO2 28 20 - 31 mmol/L   Glucose, Bld 197 (H) 65 - 99 mg/dL   BUN 11 7 - 25 mg/dL   Creat 0.89 0.50 - 1.10 mg/dL   Total Bilirubin 0.6 0.2 - 1.2 mg/dL   Alkaline Phosphatase 66 33 - 115 U/L   AST 13 10 - 35 U/L   ALT 10 6 - 29 U/L   Total Protein 6.8 6.1 - 8.1 g/dL   Albumin 3.2 (L) 3.6 - 5.1 g/dL   Calcium 9.0 8.6 - 10.2 mg/dL   GFR, Est African American 89 >=60 mL/min   GFR, Est Non African American 77 >=60 mL/min  Hemoglobin A1c  Result Value Ref Range   Hgb A1c MFr Bld 10.0 (H) <5.7 %   Mean Plasma Glucose 240 mg/dL   Diabetic Labs (most recent): Lab Results  Component Value Date   HGBA1C 10.0 (H) 03/13/2016   HGBA1C 8.4 (H) 11/06/2015   HGBA1C 8.6 (H) 07/25/2015    Lipid Panel     Component Value Date/Time   CHOL 285 (H) 04/25/2015 1001   TRIG 208 (H) 04/25/2015 1001   HDL 43 (L) 04/25/2015 1001   CHOLHDL 6.6 (H) 04/25/2015 1001   VLDL 42 (H) 04/25/2015 1001   LDLCALC 200 (H) 04/25/2015 1001     Assessment & Plan:   1. Uncontrolled type 2 diabetes mellitus with complication, with long-term current use of insulin ([place])   Her diabetes is complicated by neuropathy and possible retinopathy. - Patient has currently uncontrolled symptomatic type 2 DM of approximately since age 77 years duration, with most recent A1c 10.4%  Increasing from 8.4. It has however improved from 12.7 %.    -Patient remains at a high risk for more acute and chronic complications of  diabetes which include CAD, CVA, CKD, retinopathy, and neuropathy. These are all discussed in detail with the patient.  - I have counseled the patient on diet management and weight loss, by adopting a carbohydrate restricted/protein rich diet.  - Patient is advised to stick to a routine mealtimes to eat 3 meals  a day and avoid unnecessary snacks ( to snack only to correct hypoglycemia).   - Suggestion is made for patient to avoid simple carbohydrates   from their diet including Cakes , Desserts, Ice Cream,  Soda (  diet and regular) , Sweet Tea , Candies,  Chips, Cookies, Artificial Sweeteners,   and "Sugar-free" Products . This will help patient to have stable blood glucose profile and potentially avoid unintended weight gain.  - I encouraged the patient to switch to  unprocessed or minimally processed complex starch and increased protein intake (animal or plant source), fruits, and vegetables.  - The patient will be scheduled with Jearld Fenton, RDN, CDE for individualized DM education.  - I have approached patient with the following individualized plan to manage diabetes and patient agrees:   - I  will  Increase   Tresiba to 60 units qhs, increase Novolog to 15 units 3 times a day before meals for pre-meal blood glucose above 90 mg/dL. -She is given individualized chart to correct hypoglycemia but 150 mg/dL. -She is advised to continue strict monitoring of glucose  AC and HS. - Patient is warned not to take insulin without proper monitoring per orders.  -Patient is encouraged to call clinic for blood glucose levels less than 70 or above 300 mg /dl. - she is adamant saying she does not tolerate MTF, Invokana, and Byetta.  - Patient specific target  A1c;  LDL, HDL, Triglycerides, and  Waist Circumference were discussed in detail.  2) BP/HTN: unControlled. Continue support restriction, she is not on medications. 3) Lipids/HPL: LDL  Levels at 200, I initiated Crestor 10 mg by mouth daily at  bedtime.  4)  Weight/Diet: CDE consult in progress, exercise, and carbohydrates information provided. She   5) hypothyroidism: -Responding to treatment, I will increase  levothyroxine to 100 g by mouth every morning.  - We discussed about correct intake of levothyroxine, at fasting, with water, separated by at least 30 minutes from breakfast, and separated by more than 4 hours from calcium, iron, multivitamins, acid reflux medications (PPIs). -Patient is made aware of the fact that thyroid hormone replacement is needed for life, dose to be adjusted by periodic monitoring of thyroid function tests.  6) Chronic Care/Health Maintenance:  -  Patient isinitiated on  Statin medications. She is encouraged to continue to follow up with Ophthalmology, Podiatrist at least yearly or according to recommendations, and advised to stay away from smoking. I have recommended yearly flu vaccine and pneumonia vaccination at least every 5 years; moderate intensity exercise for up to 150 minutes weekly; and  sleep for at least 7 hours a day.   Patient to bring meter and  blood glucose logs during their next visit.   I advised patient to maintain close follow up with their PCP for primary care needs. Follow up plan: Return in about 3 months (around 06/26/2016) for follow up with pre-visit labs, meter, and logs.  Glade Lloyd, MD Phone: [phone]  Fax: [phone]   03/27/2016, 1:55 PM

## 2016-03-28 DIAGNOSIS — E11621 Type 2 diabetes mellitus with foot ulcer: Secondary | ICD-10-CM | POA: Diagnosis not present

## 2016-03-28 DIAGNOSIS — L97529 Non-pressure chronic ulcer of other part of left foot with unspecified severity: Secondary | ICD-10-CM | POA: Diagnosis not present

## 2016-03-28 DIAGNOSIS — L97522 Non-pressure chronic ulcer of other part of left foot with fat layer exposed: Secondary | ICD-10-CM | POA: Diagnosis not present

## 2016-03-28 DIAGNOSIS — S91205A Unspecified open wound of left lesser toe(s) with damage to nail, initial encounter: Secondary | ICD-10-CM | POA: Diagnosis not present

## 2016-03-28 DIAGNOSIS — I890 Lymphedema, not elsewhere classified: Secondary | ICD-10-CM | POA: Diagnosis not present

## 2016-04-04 DIAGNOSIS — L97522 Non-pressure chronic ulcer of other part of left foot with fat layer exposed: Secondary | ICD-10-CM | POA: Diagnosis not present

## 2016-04-04 DIAGNOSIS — L97529 Non-pressure chronic ulcer of other part of left foot with unspecified severity: Secondary | ICD-10-CM | POA: Diagnosis not present

## 2016-04-04 DIAGNOSIS — S91205A Unspecified open wound of left lesser toe(s) with damage to nail, initial encounter: Secondary | ICD-10-CM | POA: Diagnosis not present

## 2016-04-04 DIAGNOSIS — I890 Lymphedema, not elsewhere classified: Secondary | ICD-10-CM | POA: Diagnosis not present

## 2016-04-04 DIAGNOSIS — E11621 Type 2 diabetes mellitus with foot ulcer: Secondary | ICD-10-CM | POA: Diagnosis not present

## 2016-04-08 ENCOUNTER — Other Ambulatory Visit: Payer: Self-pay | Admitting: "Endocrinology

## 2016-04-11 DIAGNOSIS — E11621 Type 2 diabetes mellitus with foot ulcer: Secondary | ICD-10-CM | POA: Diagnosis not present

## 2016-04-11 DIAGNOSIS — I890 Lymphedema, not elsewhere classified: Secondary | ICD-10-CM | POA: Diagnosis not present

## 2016-04-11 DIAGNOSIS — E119 Type 2 diabetes mellitus without complications: Secondary | ICD-10-CM | POA: Diagnosis not present

## 2016-04-11 DIAGNOSIS — L97522 Non-pressure chronic ulcer of other part of left foot with fat layer exposed: Secondary | ICD-10-CM | POA: Diagnosis not present

## 2016-04-11 DIAGNOSIS — Z09 Encounter for follow-up examination after completed treatment for conditions other than malignant neoplasm: Secondary | ICD-10-CM | POA: Diagnosis not present

## 2016-04-11 DIAGNOSIS — Z8631 Personal history of diabetic foot ulcer: Secondary | ICD-10-CM | POA: Diagnosis not present

## 2016-04-12 DIAGNOSIS — E119 Type 2 diabetes mellitus without complications: Secondary | ICD-10-CM | POA: Diagnosis not present

## 2016-04-12 DIAGNOSIS — E7800 Pure hypercholesterolemia, unspecified: Secondary | ICD-10-CM | POA: Diagnosis not present

## 2016-04-16 DIAGNOSIS — F341 Dysthymic disorder: Secondary | ICD-10-CM | POA: Diagnosis not present

## 2016-04-16 DIAGNOSIS — E11621 Type 2 diabetes mellitus with foot ulcer: Secondary | ICD-10-CM | POA: Diagnosis not present

## 2016-04-16 DIAGNOSIS — I10 Essential (primary) hypertension: Secondary | ICD-10-CM | POA: Diagnosis not present

## 2016-04-16 DIAGNOSIS — M1990 Unspecified osteoarthritis, unspecified site: Secondary | ICD-10-CM | POA: Diagnosis not present

## 2016-04-16 DIAGNOSIS — F4310 Post-traumatic stress disorder, unspecified: Secondary | ICD-10-CM | POA: Diagnosis not present

## 2016-04-16 DIAGNOSIS — L97529 Non-pressure chronic ulcer of other part of left foot with unspecified severity: Secondary | ICD-10-CM | POA: Diagnosis not present

## 2016-04-23 DIAGNOSIS — F341 Dysthymic disorder: Secondary | ICD-10-CM | POA: Diagnosis not present

## 2016-04-23 DIAGNOSIS — E11621 Type 2 diabetes mellitus with foot ulcer: Secondary | ICD-10-CM | POA: Diagnosis not present

## 2016-04-23 DIAGNOSIS — M1990 Unspecified osteoarthritis, unspecified site: Secondary | ICD-10-CM | POA: Diagnosis not present

## 2016-04-23 DIAGNOSIS — F4310 Post-traumatic stress disorder, unspecified: Secondary | ICD-10-CM | POA: Diagnosis not present

## 2016-04-23 DIAGNOSIS — I10 Essential (primary) hypertension: Secondary | ICD-10-CM | POA: Diagnosis not present

## 2016-04-23 DIAGNOSIS — L97529 Non-pressure chronic ulcer of other part of left foot with unspecified severity: Secondary | ICD-10-CM | POA: Diagnosis not present

## 2016-05-01 DIAGNOSIS — L97529 Non-pressure chronic ulcer of other part of left foot with unspecified severity: Secondary | ICD-10-CM | POA: Diagnosis not present

## 2016-05-01 DIAGNOSIS — F341 Dysthymic disorder: Secondary | ICD-10-CM | POA: Diagnosis not present

## 2016-05-01 DIAGNOSIS — E11621 Type 2 diabetes mellitus with foot ulcer: Secondary | ICD-10-CM | POA: Diagnosis not present

## 2016-05-01 DIAGNOSIS — M1990 Unspecified osteoarthritis, unspecified site: Secondary | ICD-10-CM | POA: Diagnosis not present

## 2016-05-01 DIAGNOSIS — F4310 Post-traumatic stress disorder, unspecified: Secondary | ICD-10-CM | POA: Diagnosis not present

## 2016-05-01 DIAGNOSIS — I10 Essential (primary) hypertension: Secondary | ICD-10-CM | POA: Diagnosis not present

## 2016-05-17 DIAGNOSIS — E7800 Pure hypercholesterolemia, unspecified: Secondary | ICD-10-CM | POA: Diagnosis not present

## 2016-05-17 DIAGNOSIS — E119 Type 2 diabetes mellitus without complications: Secondary | ICD-10-CM | POA: Diagnosis not present

## 2016-06-25 ENCOUNTER — Other Ambulatory Visit: Payer: Self-pay | Admitting: "Endocrinology

## 2016-06-25 DIAGNOSIS — Z794 Long term (current) use of insulin: Secondary | ICD-10-CM | POA: Diagnosis not present

## 2016-06-25 DIAGNOSIS — E039 Hypothyroidism, unspecified: Secondary | ICD-10-CM | POA: Diagnosis not present

## 2016-06-25 DIAGNOSIS — E1165 Type 2 diabetes mellitus with hyperglycemia: Secondary | ICD-10-CM | POA: Diagnosis not present

## 2016-06-25 DIAGNOSIS — E118 Type 2 diabetes mellitus with unspecified complications: Secondary | ICD-10-CM | POA: Diagnosis not present

## 2016-06-26 LAB — COMPREHENSIVE METABOLIC PANEL
ALBUMIN: 3.1 g/dL — AB (ref 3.6–5.1)
ALT: 7 U/L (ref 6–29)
AST: 11 U/L (ref 10–35)
Alkaline Phosphatase: 67 U/L (ref 33–115)
BILIRUBIN TOTAL: 0.5 mg/dL (ref 0.2–1.2)
BUN: 12 mg/dL (ref 7–25)
CO2: 28 mmol/L (ref 20–31)
CREATININE: 0.81 mg/dL (ref 0.50–1.10)
Calcium: 8.7 mg/dL (ref 8.6–10.2)
Chloride: 103 mmol/L (ref 98–110)
Glucose, Bld: 100 mg/dL — ABNORMAL HIGH (ref 65–99)
Potassium: 4.2 mmol/L (ref 3.5–5.3)
SODIUM: 140 mmol/L (ref 135–146)
TOTAL PROTEIN: 6.8 g/dL (ref 6.1–8.1)

## 2016-06-26 LAB — T4, FREE: FREE T4: 1.3 ng/dL (ref 0.8–1.8)

## 2016-06-26 LAB — TSH: TSH: 1.77 m[IU]/L

## 2016-06-26 LAB — HEMOGLOBIN A1C
Hgb A1c MFr Bld: 9.2 % — ABNORMAL HIGH (ref <5.7)
Mean Plasma Glucose: 217 mg/dL

## 2016-06-28 DIAGNOSIS — F329 Major depressive disorder, single episode, unspecified: Secondary | ICD-10-CM | POA: Diagnosis not present

## 2016-06-28 DIAGNOSIS — Z1389 Encounter for screening for other disorder: Secondary | ICD-10-CM | POA: Diagnosis not present

## 2016-06-28 DIAGNOSIS — Z299 Encounter for prophylactic measures, unspecified: Secondary | ICD-10-CM | POA: Diagnosis not present

## 2016-06-28 DIAGNOSIS — Z0000 Encounter for general adult medical examination without abnormal findings: Secondary | ICD-10-CM | POA: Diagnosis not present

## 2016-06-28 DIAGNOSIS — Z7189 Other specified counseling: Secondary | ICD-10-CM | POA: Diagnosis not present

## 2016-06-28 DIAGNOSIS — Z1211 Encounter for screening for malignant neoplasm of colon: Secondary | ICD-10-CM | POA: Diagnosis not present

## 2016-07-03 ENCOUNTER — Encounter: Payer: Self-pay | Admitting: "Endocrinology

## 2016-07-03 ENCOUNTER — Ambulatory Visit (INDEPENDENT_AMBULATORY_CARE_PROVIDER_SITE_OTHER): Payer: Medicare Other | Admitting: "Endocrinology

## 2016-07-03 VITALS — BP 160/90 | HR 82 | Ht 67.5 in | Wt 323.0 lb

## 2016-07-03 DIAGNOSIS — E782 Mixed hyperlipidemia: Secondary | ICD-10-CM

## 2016-07-03 DIAGNOSIS — Z794 Long term (current) use of insulin: Secondary | ICD-10-CM

## 2016-07-03 DIAGNOSIS — E118 Type 2 diabetes mellitus with unspecified complications: Secondary | ICD-10-CM | POA: Diagnosis not present

## 2016-07-03 DIAGNOSIS — E1165 Type 2 diabetes mellitus with hyperglycemia: Secondary | ICD-10-CM

## 2016-07-03 DIAGNOSIS — IMO0002 Reserved for concepts with insufficient information to code with codable children: Secondary | ICD-10-CM

## 2016-07-03 DIAGNOSIS — E039 Hypothyroidism, unspecified: Secondary | ICD-10-CM

## 2016-07-03 MED ORDER — INSULIN ASPART 100 UNIT/ML FLEXPEN: 15.0000 [IU] | PEN_INJECTOR | Freq: Three times a day (TID) | SUBCUTANEOUS | 2 refills | Status: DC

## 2016-07-03 MED ORDER — INSULIN DEGLUDEC 200 UNIT/ML ~~LOC~~ SOPN: 60.0000 [IU] | PEN_INJECTOR | Freq: Every day | SUBCUTANEOUS | 2 refills | Status: DC

## 2016-07-03 NOTE — Patient Instructions (Signed)

## 2016-07-03 NOTE — Progress Notes (Signed)
Subjective:    Patient ID: Michelle Skinner, female    DOB: 1965-09-15,    Past Medical History:  Diagnosis Date  . Cervical cancer ([place])   . Depression   . Diabetes mellitus, type II ([place])   . GERD (gastroesophageal reflux disease)   . Hyperlipidemia    Past Surgical History:  Procedure Laterality Date  . ABDOMINAL HYSTERECTOMY    . OTHER SURGICAL HISTORY     R Foot   Social History   Social History  . Marital status: Married    Spouse name: N/A  . Number of children: N/A  . Years of education: N/A   Social History Main Topics  . Smoking status: Never Smoker  . Smokeless tobacco: Never Used  . Alcohol use No  . Drug use: No  . Sexual activity: Not Asked   Other Topics Concern  . None   Social History Narrative  . None   Outpatient Encounter Prescriptions as of 07/03/2016  Medication Sig  . glucose blood test strip Use as instructed 4 x daily. E11.65  . insulin aspart (NOVOLOG FLEXPEN) 100 UNIT/ML FlexPen Inject 15-21 Units into the skin 3 (three) times daily with meals.  . Insulin Degludec (TRESIBA FLEXTOUCH) 200 UNIT/ML SOPN Inject 60 Units into the skin daily.  Marland Kitchen levothyroxine (SYNTHROID, LEVOTHROID) 100 MCG tablet Take 1 tablet (100 mcg total) by mouth daily.  . pregabalin (LYRICA) 75 MG capsule Take 75 mg by mouth 2 (two) times daily.  . rosuvastatin (CRESTOR) 10 MG tablet TAKE 1 TABLET (10 MG TOTAL) BY MOUTH DAILY.  . [DISCONTINUED] insulin aspart (NOVOLOG FLEXPEN) 100 UNIT/ML FlexPen Inject 15-21 Units into the skin 3 (three) times daily with meals.  . [DISCONTINUED] Insulin Degludec (TRESIBA FLEXTOUCH) 200 UNIT/ML SOPN Inject 60 Units into the skin daily.   No facility-administered encounter medications on file as of 07/03/2016.    ALLERGIES: Allergies  Allergen Reactions  . Bactrim [Sulfamethoxazole-Trimethoprim]   . Gabapentin   . Invokana [Canagliflozin]    VACCINATION STATUS:  There is no immunization history on file for this  patient.  Diabetes  She presents for her follow-up diabetic visit. She has type 2 diabetes mellitus. Onset time: She was diagnosed at approximate age of 68 years. Her disease course has been improving. There are no hypoglycemic associated symptoms. Pertinent negatives for hypoglycemia include no confusion, headaches, pallor or seizures. Associated symptoms include fatigue. Pertinent negatives for diabetes include no chest pain, no polydipsia, no polyphagia and no polyuria. There are no hypoglycemic complications. Symptoms are improving. Diabetic complications include peripheral neuropathy and retinopathy. Pertinent negatives for diabetic complications include no autonomic neuropathy. Risk factors for coronary artery disease include diabetes mellitus, dyslipidemia, hypertension, obesity and sedentary lifestyle. She is compliant with treatment most of the time. Her weight is increasing steadily. She is following a generally unhealthy diet. She has not had a previous visit with a dietitian (She missed her appointment.). She never participates in exercise. Her home blood glucose trend is increasing steadily. (Patient came with no meter nor logs to review today.) An ACE inhibitor/angiotensin II receptor blocker is not being taken. Eye exam is current.  Hyperlipidemia  This is a chronic problem. The current episode started more than 1 year ago. The problem is uncontrolled. Recent lipid tests were reviewed and are high. Exacerbating diseases include diabetes, hypothyroidism and obesity. Pertinent negatives include no chest pain, myalgias or shortness of breath. She is currently on no antihyperlipidemic treatment.  Thyroid Problem  Presents for  initial visit. Symptoms include fatigue. Patient reports no cold intolerance, diarrhea, heat intolerance or palpitations. The following procedures have not been performed: radioiodine uptake scan and thyroidectomy. Her past medical history is significant for diabetes and  hyperlipidemia.     Review of Systems  Constitutional: Positive for fatigue. Negative for unexpected weight change.  HENT: Negative for trouble swallowing and voice change.   Eyes: Negative for visual disturbance.  Respiratory: Negative for cough, shortness of breath and wheezing.   Cardiovascular: Negative for chest pain, palpitations and leg swelling.  Gastrointestinal: Negative for diarrhea, nausea and vomiting.  Endocrine: Negative for cold intolerance, heat intolerance, polydipsia, polyphagia and polyuria.  Musculoskeletal: Negative for arthralgias and myalgias.  Skin: Negative for color change, pallor, rash and wound.  Neurological: Negative for seizures and headaches.  Psychiatric/Behavioral: Negative for confusion and suicidal ideas.    Objective:    BP (!) 160/90   Pulse 82   Ht 5' 7.5" (1.715 m)   Wt (!) 323 lb (146.5 kg)   BMI 49.84 kg/m   Wt Readings from Last 3 Encounters:  07/03/16 (!) 323 lb (146.5 kg)  03/27/16 (!) 327 lb (148.3 kg)  11/22/15 (!) 327 lb (148.3 kg)    Physical Exam  Constitutional: She is oriented to person, place, and time. She appears well-developed.  She has poor hygiene generally.  HENT:  Head: Normocephalic and atraumatic.  She has poor dentition.  Eyes: EOM are normal.  Neck: Normal range of motion. Neck supple. No tracheal deviation present. No thyromegaly present.  Cardiovascular: Normal rate and regular rhythm.   Pulmonary/Chest: Effort normal and breath sounds normal.  Abdominal: Soft. Bowel sounds are normal. There is no tenderness. There is no guarding.  Musculoskeletal: Normal range of motion. She exhibits no edema.  Neurological: She is alert and oriented to person, place, and time. She has normal reflexes. No cranial nerve deficit. Coordination normal.  Skin: Skin is warm and dry. No rash noted. No erythema. No pallor.  Psychiatric: She has a normal mood and affect. Judgment normal.  Reluctant affect .    Results for  orders placed or performed in visit on 06/25/16  Comprehensive metabolic panel  Result Value Ref Range   Sodium 140 135 - 146 mmol/L   Potassium 4.2 3.5 - 5.3 mmol/L   Chloride 103 98 - 110 mmol/L   CO2 28 20 - 31 mmol/L   Glucose, Bld 100 (H) 65 - 99 mg/dL   BUN 12 7 - 25 mg/dL   Creat 0.81 0.50 - 1.10 mg/dL   Total Bilirubin 0.5 0.2 - 1.2 mg/dL   Alkaline Phosphatase 67 33 - 115 U/L   AST 11 10 - 35 U/L   ALT 7 6 - 29 U/L   Total Protein 6.8 6.1 - 8.1 g/dL   Albumin 3.1 (L) 3.6 - 5.1 g/dL   Calcium 8.7 8.6 - 10.2 mg/dL  TSH  Result Value Ref Range   TSH 1.77 mIU/L  T4, free  Result Value Ref Range   Free T4 1.3 0.8 - 1.8 ng/dL  Hemoglobin A1c  Result Value Ref Range   Hgb A1c MFr Bld 9.2 (H) <5.7 %   Mean Plasma Glucose 217 mg/dL   Diabetic Labs (most recent): Lab Results  Component Value Date   HGBA1C 9.2 (H) 06/25/2016   HGBA1C 10.0 (H) 03/13/2016   HGBA1C 8.4 (H) 11/06/2015    Lipid Panel     Component Value Date/Time   CHOL 285 (H) 04/25/2015 1001  TRIG 208 (H) 04/25/2015 1001   HDL 43 (L) 04/25/2015 1001   CHOLHDL 6.6 (H) 04/25/2015 1001   VLDL 42 (H) 04/25/2015 1001   LDLCALC 200 (H) 04/25/2015 1001     Assessment & Plan:   1. Uncontrolled type 2 diabetes mellitus with complication, with long-term current use of insulin ([place])   Her diabetes is complicated by neuropathy and possible retinopathy. - Patient has currently uncontrolled symptomatic type 2 DM of approximately since age 19 years duration, with most recent A1c  9.2% improving generally from 12.7%.  -Patient remains at a high risk for more acute and chronic complications of diabetes which include CAD, CVA, CKD, retinopathy, and neuropathy. These are all discussed in detail with the patient.  - I have counseled the patient on diet management and weight loss, by adopting a carbohydrate restricted/protein rich diet.  - Patient is advised to stick to a routine mealtimes to eat 3 meals  a day and  avoid unnecessary snacks ( to snack only to correct hypoglycemia).   - Suggestion is made for patient to avoid simple carbohydrates   from their diet including Cakes , Desserts, Ice Cream,  Soda (  diet and regular) , Sweet Tea , Candies,  Chips, Cookies, Artificial Sweeteners,   and "Sugar-free" Products . This will help patient to have stable blood glucose profile and potentially avoid unintended weight gain.  - I encouraged the patient to switch to  unprocessed or minimally processed complex starch and increased protein intake (animal or plant source), fruits, and vegetables.  - The patient will be scheduled with Jearld Fenton, RDN, CDE for individualized DM education.  - I have approached patient with the following individualized plan to manage diabetes and patient agrees:   - She did not bring any meter nor logs to review today. This makes it difficult to adjust her therapy.  - I will continue  Tresiba  60 units qhs,  Novolog to 15 units 3 times a day before meals for pre-meal blood glucose above 90 mg/dL. -She is given individualized chart to correct hypoglycemia but 150 mg/dL. -She is advised to continue strict monitoring of glucose  AC and HS. - Patient is warned not to take insulin without proper monitoring per orders.  -Patient is encouraged to call clinic for blood glucose levels less than 70 or above 300 mg /dl. - she is adamant saying that she does not tolerate MTF, Invokana, and Byetta.  - Patient specific target  A1c;  LDL, HDL, Triglycerides, and  Waist Circumference were discussed in detail.  2) BP/HTN: unControlled. Continue support restriction, she is not on medications. 3) Lipids/HPL: LDL  Levels at 200, I initiated Crestor 10 mg by mouth daily at bedtime.  4)  Weight/Diet: CDE consult in progress, exercise, and carbohydrates information provided. She   5) hypothyroidism: -Responding to treatment, I will continue  levothyroxine 100 g by mouth every morning.  - We  discussed about correct intake of levothyroxine, at fasting, with water, separated by at least 30 minutes from breakfast, and separated by more than 4 hours from calcium, iron, multivitamins, acid reflux medications (PPIs). -Patient is made aware of the fact that thyroid hormone replacement is needed for life, dose to be adjusted by periodic monitoring of thyroid function tests.  6) Chronic Care/Health Maintenance:  -Patient isinitiated on  Statin medications. She is encouraged to continue to follow up with Ophthalmology, Podiatrist at least yearly or according to recommendations, and advised to stay away from smoking. I  have recommended yearly flu vaccine and pneumonia vaccination at least every 5 years; moderate intensity exercise for up to 150 minutes weekly; and  sleep for at least 7 hours a day.   Patient to bring meter and  blood glucose logs during their next visit.   I advised patient to maintain close follow up with their PCP for primary care needs. Follow up plan: No Follow-up on file.  Glade Lloyd, MD Phone: [phone]  Fax: [phone]   07/03/2016, 2:01 PM

## 2016-08-02 DIAGNOSIS — E11621 Type 2 diabetes mellitus with foot ulcer: Secondary | ICD-10-CM | POA: Diagnosis not present

## 2016-08-02 DIAGNOSIS — I10 Essential (primary) hypertension: Secondary | ICD-10-CM | POA: Diagnosis not present

## 2016-08-02 DIAGNOSIS — Z6841 Body Mass Index (BMI) 40.0 and over, adult: Secondary | ICD-10-CM | POA: Diagnosis not present

## 2016-08-02 DIAGNOSIS — Z299 Encounter for prophylactic measures, unspecified: Secondary | ICD-10-CM | POA: Diagnosis not present

## 2016-08-02 DIAGNOSIS — D649 Anemia, unspecified: Secondary | ICD-10-CM | POA: Diagnosis not present

## 2016-08-02 DIAGNOSIS — L97509 Non-pressure chronic ulcer of other part of unspecified foot with unspecified severity: Secondary | ICD-10-CM | POA: Diagnosis not present

## 2016-08-02 DIAGNOSIS — K3184 Gastroparesis: Secondary | ICD-10-CM | POA: Diagnosis not present

## 2016-08-02 DIAGNOSIS — E1143 Type 2 diabetes mellitus with diabetic autonomic (poly)neuropathy: Secondary | ICD-10-CM | POA: Diagnosis not present

## 2016-08-02 DIAGNOSIS — J069 Acute upper respiratory infection, unspecified: Secondary | ICD-10-CM | POA: Diagnosis not present

## 2016-08-02 DIAGNOSIS — Z789 Other specified health status: Secondary | ICD-10-CM | POA: Diagnosis not present

## 2016-08-12 ENCOUNTER — Other Ambulatory Visit: Payer: Self-pay | Admitting: "Endocrinology

## 2016-08-27 DIAGNOSIS — E119 Type 2 diabetes mellitus without complications: Secondary | ICD-10-CM | POA: Diagnosis not present

## 2016-08-27 DIAGNOSIS — E7800 Pure hypercholesterolemia, unspecified: Secondary | ICD-10-CM | POA: Diagnosis not present

## 2016-09-10 ENCOUNTER — Other Ambulatory Visit: Payer: Self-pay | Admitting: "Endocrinology

## 2016-09-10 DIAGNOSIS — Z1231 Encounter for screening mammogram for malignant neoplasm of breast: Secondary | ICD-10-CM | POA: Diagnosis not present

## 2016-09-12 ENCOUNTER — Other Ambulatory Visit: Payer: Self-pay | Admitting: "Endocrinology

## 2016-09-12 DIAGNOSIS — E1165 Type 2 diabetes mellitus with hyperglycemia: Secondary | ICD-10-CM | POA: Diagnosis not present

## 2016-09-12 DIAGNOSIS — Z794 Long term (current) use of insulin: Secondary | ICD-10-CM | POA: Diagnosis not present

## 2016-09-12 DIAGNOSIS — E118 Type 2 diabetes mellitus with unspecified complications: Secondary | ICD-10-CM | POA: Diagnosis not present

## 2016-09-12 LAB — COMPREHENSIVE METABOLIC PANEL
ALT: 14 U/L (ref 6–29)
AST: 13 U/L (ref 10–35)
Albumin: 3.2 g/dL — ABNORMAL LOW (ref 3.6–5.1)
Alkaline Phosphatase: 88 U/L (ref 33–115)
BUN: 13 mg/dL (ref 7–25)
CALCIUM: 9 mg/dL (ref 8.6–10.2)
CHLORIDE: 101 mmol/L (ref 98–110)
CO2: 28 mmol/L (ref 20–31)
Creat: 0.89 mg/dL (ref 0.50–1.10)
GLUCOSE: 340 mg/dL — AB (ref 65–99)
POTASSIUM: 4.5 mmol/L (ref 3.5–5.3)
Sodium: 136 mmol/L (ref 135–146)
TOTAL PROTEIN: 6.7 g/dL (ref 6.1–8.1)
Total Bilirubin: 0.4 mg/dL (ref 0.2–1.2)

## 2016-09-12 LAB — HEMOGLOBIN A1C
Hgb A1c MFr Bld: 11.3 % — ABNORMAL HIGH (ref <5.7)
Mean Plasma Glucose: 278 mg/dL

## 2016-09-26 DIAGNOSIS — E119 Type 2 diabetes mellitus without complications: Secondary | ICD-10-CM | POA: Diagnosis not present

## 2016-09-26 DIAGNOSIS — E7800 Pure hypercholesterolemia, unspecified: Secondary | ICD-10-CM | POA: Diagnosis not present

## 2016-09-30 DIAGNOSIS — E7800 Pure hypercholesterolemia, unspecified: Secondary | ICD-10-CM | POA: Diagnosis not present

## 2016-09-30 DIAGNOSIS — Z299 Encounter for prophylactic measures, unspecified: Secondary | ICD-10-CM | POA: Diagnosis not present

## 2016-09-30 DIAGNOSIS — F329 Major depressive disorder, single episode, unspecified: Secondary | ICD-10-CM | POA: Diagnosis not present

## 2016-09-30 DIAGNOSIS — K219 Gastro-esophageal reflux disease without esophagitis: Secondary | ICD-10-CM | POA: Diagnosis not present

## 2016-09-30 DIAGNOSIS — L97509 Non-pressure chronic ulcer of other part of unspecified foot with unspecified severity: Secondary | ICD-10-CM | POA: Diagnosis not present

## 2016-09-30 DIAGNOSIS — Z713 Dietary counseling and surveillance: Secondary | ICD-10-CM | POA: Diagnosis not present

## 2016-09-30 DIAGNOSIS — E11621 Type 2 diabetes mellitus with foot ulcer: Secondary | ICD-10-CM | POA: Diagnosis not present

## 2016-09-30 DIAGNOSIS — Z6821 Body mass index (BMI) 21.0-21.9, adult: Secondary | ICD-10-CM | POA: Diagnosis not present

## 2016-09-30 DIAGNOSIS — Z6841 Body Mass Index (BMI) 40.0 and over, adult: Secondary | ICD-10-CM | POA: Diagnosis not present

## 2016-09-30 DIAGNOSIS — C539 Malignant neoplasm of cervix uteri, unspecified: Secondary | ICD-10-CM | POA: Diagnosis not present

## 2016-09-30 DIAGNOSIS — I10 Essential (primary) hypertension: Secondary | ICD-10-CM | POA: Diagnosis not present

## 2016-10-02 ENCOUNTER — Ambulatory Visit (INDEPENDENT_AMBULATORY_CARE_PROVIDER_SITE_OTHER): Payer: Medicare Other | Admitting: "Endocrinology

## 2016-10-02 ENCOUNTER — Encounter: Payer: Self-pay | Admitting: "Endocrinology

## 2016-10-02 VITALS — BP 136/82 | HR 60 | Ht 67.5 in | Wt 326.0 lb

## 2016-10-02 DIAGNOSIS — IMO0002 Reserved for concepts with insufficient information to code with codable children: Secondary | ICD-10-CM

## 2016-10-02 DIAGNOSIS — E118 Type 2 diabetes mellitus with unspecified complications: Secondary | ICD-10-CM

## 2016-10-02 DIAGNOSIS — E782 Mixed hyperlipidemia: Secondary | ICD-10-CM | POA: Diagnosis not present

## 2016-10-02 DIAGNOSIS — Z794 Long term (current) use of insulin: Secondary | ICD-10-CM

## 2016-10-02 DIAGNOSIS — E039 Hypothyroidism, unspecified: Secondary | ICD-10-CM

## 2016-10-02 DIAGNOSIS — E1165 Type 2 diabetes mellitus with hyperglycemia: Secondary | ICD-10-CM

## 2016-10-02 MED ORDER — INSULIN DEGLUDEC 200 UNIT/ML ~~LOC~~ SOPN: 60.0000 [IU] | PEN_INJECTOR | Freq: Every day | SUBCUTANEOUS | 2 refills | Status: DC

## 2016-10-02 NOTE — Patient Instructions (Signed)

## 2016-10-02 NOTE — Progress Notes (Signed)
Subjective:    Patient ID: Michelle Skinner, female    DOB: [birth year],    Past Medical History:  Diagnosis Date  . Cervical cancer ([place])   . Depression   . Diabetes mellitus, type II ([place])   . GERD (gastroesophageal reflux disease)   . Hyperlipidemia    Past Surgical History:  Procedure Laterality Date  . ABDOMINAL HYSTERECTOMY    . OTHER SURGICAL HISTORY     R Foot   Social History   Social History  . Marital status: Married    Spouse name: N/A  . Number of children: N/A  . Years of education: N/A   Social History Main Topics  . Smoking status: Never Smoker  . Smokeless tobacco: Never Used  . Alcohol use No  . Drug use: No  . Sexual activity: Not Asked   Other Topics Concern  . None   Social History Narrative  . None   Outpatient Encounter Prescriptions as of 10/02/2016  Medication Sig  . atorvastatin (LIPITOR) 20 MG tablet   . glucose blood test strip Use as instructed 4 x daily. E11.65  . insulin aspart (NOVOLOG FLEXPEN) 100 UNIT/ML FlexPen Inject 15-21 Units into the skin 3 (three) times daily with meals.  . Insulin Degludec (TRESIBA FLEXTOUCH) 200 UNIT/ML SOPN Inject 60 Units into the skin daily.  Marland Kitchen levothyroxine (SYNTHROID, LEVOTHROID) 100 MCG tablet Take 1 tablet (100 mcg total) by mouth daily.  . pregabalin (LYRICA) 75 MG capsule Take 75 mg by mouth 2 (two) times daily.  . [DISCONTINUED] rosuvastatin (CRESTOR) 10 MG tablet TAKE 1 TABLET (10 MG TOTAL) BY MOUTH DAILY.  . [DISCONTINUED] rosuvastatin (CRESTOR) 10 MG tablet TAKE 1 TABLET (10 MG TOTAL) BY MOUTH DAILY.  . [DISCONTINUED] TRESIBA FLEXTOUCH 200 UNIT/ML SOPN INJECT 50 UNITS INTO THE SKIN DAILY.   No facility-administered encounter medications on file as of 10/02/2016.    ALLERGIES: Allergies  Allergen Reactions  . Bactrim [Sulfamethoxazole-Trimethoprim]   . Gabapentin   . Invokana [Canagliflozin]    VACCINATION STATUS:  There is no immunization history on file for this  patient.  Diabetes  She presents for her follow-up diabetic visit. She has type 2 diabetes mellitus. Onset time: She was diagnosed at approximate age of 4 years. Her disease course has been worsening. There are no hypoglycemic associated symptoms. Pertinent negatives for hypoglycemia include no confusion, headaches, pallor or seizures. Associated symptoms include fatigue. Pertinent negatives for diabetes include no chest pain, no polydipsia, no polyphagia and no polyuria. There are no hypoglycemic complications. Symptoms are worsening. Diabetic complications include peripheral neuropathy and retinopathy. Pertinent negatives for diabetic complications include no autonomic neuropathy. Risk factors for coronary artery disease include diabetes mellitus, dyslipidemia, hypertension, obesity and sedentary lifestyle. She is compliant with treatment most of the time. Her weight is increasing steadily. She is following a generally unhealthy diet. She has not had a previous visit with a dietitian (She missed her appointment.). She never participates in exercise. Her home blood glucose trend is increasing steadily. Her overall blood glucose range is >200 mg/dl. (Patient came with no meter  for the third time, she logs fluctuating readings some of them are suspicious. ) An ACE inhibitor/angiotensin II receptor blocker is not being taken. Eye exam is current.  Hyperlipidemia  This is a chronic problem. The current episode started more than 1 year ago. The problem is uncontrolled. Recent lipid tests were reviewed and are high. Exacerbating diseases include diabetes, hypothyroidism and obesity. Pertinent negatives include no  chest pain, myalgias or shortness of breath. She is currently on no antihyperlipidemic treatment.  Thyroid Problem  Presents for initial visit. Symptoms include fatigue. Patient reports no cold intolerance, diarrhea, heat intolerance or palpitations. The following procedures have not been performed:  radioiodine uptake scan and thyroidectomy. Her past medical history is significant for diabetes and hyperlipidemia.     Review of Systems  Constitutional: Positive for fatigue. Negative for unexpected weight change.  HENT: Negative for trouble swallowing and voice change.   Eyes: Negative for visual disturbance.  Respiratory: Negative for cough, shortness of breath and wheezing.   Cardiovascular: Negative for chest pain, palpitations and leg swelling.  Gastrointestinal: Negative for diarrhea, nausea and vomiting.  Endocrine: Negative for cold intolerance, heat intolerance, polydipsia, polyphagia and polyuria.  Musculoskeletal: Negative for arthralgias and myalgias.  Skin: Negative for color change, pallor, rash and wound.  Neurological: Negative for seizures and headaches.  Psychiatric/Behavioral: Negative for confusion and suicidal ideas.    Objective:    BP 136/82   Pulse 60   Ht 5' 7.5" (1.715 m)   Wt (!) 326 lb (147.9 kg)   BMI 50.31 kg/m   Wt Readings from Last 3 Encounters:  10/02/16 (!) 326 lb (147.9 kg)  07/03/16 (!) 323 lb (146.5 kg)  03/27/16 (!) 327 lb (148.3 kg)    Physical Exam  Constitutional: She is oriented to person, place, and time. She appears well-developed.  She has poor hygiene generally.  HENT:  Head: Normocephalic and atraumatic.  She has poor dentition.  Eyes: EOM are normal.  Neck: Normal range of motion. Neck supple. No tracheal deviation present. No thyromegaly present.  Cardiovascular: Normal rate and regular rhythm.   Pulmonary/Chest: Effort normal and breath sounds normal.  Abdominal: Soft. Bowel sounds are normal. There is no tenderness. There is no guarding.  Musculoskeletal: Normal range of motion. She exhibits no edema.  Neurological: She is alert and oriented to person, place, and time. She has normal reflexes. No cranial nerve deficit. Coordination normal.  Skin: Skin is warm and dry. No rash noted. No erythema. No pallor.   Psychiatric: She has a normal mood and affect. Judgment normal.  Reluctant affect .    Results for orders placed or performed in visit on 09/12/16  Comprehensive metabolic panel  Result Value Ref Range   Sodium 136 135 - 146 mmol/L   Potassium 4.5 3.5 - 5.3 mmol/L   Chloride 101 98 - 110 mmol/L   CO2 28 20 - 31 mmol/L   Glucose, Bld 340 (H) 65 - 99 mg/dL   BUN 13 7 - 25 mg/dL   Creat 0.89 0.50 - 1.10 mg/dL   Total Bilirubin 0.4 0.2 - 1.2 mg/dL   Alkaline Phosphatase 88 33 - 115 U/L   AST 13 10 - 35 U/L   ALT 14 6 - 29 U/L   Total Protein 6.7 6.1 - 8.1 g/dL   Albumin 3.2 (L) 3.6 - 5.1 g/dL   Calcium 9.0 8.6 - 10.2 mg/dL  Hemoglobin A1c  Result Value Ref Range   Hgb A1c MFr Bld 11.3 (H) <5.7 %   Mean Plasma Glucose 278 mg/dL   Diabetic Labs (most recent): Lab Results  Component Value Date   HGBA1C 11.3 (H) 09/12/2016   HGBA1C 9.2 (H) 06/25/2016   HGBA1C 10.0 (H) 03/13/2016    Lipid Panel     Component Value Date/Time   CHOL 285 (H) 04/25/2015 1001   TRIG 208 (H) 04/25/2015 1001   HDL 43 (  L) 04/25/2015 1001   CHOLHDL 6.6 (H) 04/25/2015 1001   VLDL 42 (H) 04/25/2015 1001   LDLCALC 200 (H) 04/25/2015 1001     Assessment & Plan:   1. Uncontrolled type 2 diabetes mellitus with complication, with long-term current use of insulin ([place])   Her diabetes is complicated by neuropathy and possible retinopathy. - Patient has currently uncontrolled symptomatic type 2 DM of approximately since age 15 years duration. - She came with increasing A1c of 11.3% from  9.2%. -Patient remains at a high risk for more acute and chronic complications of diabetes which include CAD, CVA, CKD, retinopathy, and neuropathy. These are all discussed in detail with the patient.  - I have counseled the patient on diet management and weight loss, by adopting a carbohydrate restricted/protein rich diet.  - Patient is advised to stick to a routine mealtimes to eat 3 meals  a day and avoid  unnecessary snacks ( to snack only to correct hypoglycemia).   - Suggestion is made for patient to avoid simple carbohydrates   from their diet including Cakes , Desserts, Ice Cream,  Soda (  diet and regular) , Sweet Tea , Candies,  Chips, Cookies, Artificial Sweeteners,   and "Sugar-free" Products . This will help patient to have stable blood glucose profile and potentially avoid unintended weight gain.  - I encouraged the patient to switch to  unprocessed or minimally processed complex starch and increased protein intake (animal or plant source), fruits, and vegetables.  - The patient will be scheduled with Jearld Fenton, RDN, CDE for individualized DM education.  - I have approached patient with the following individualized plan to manage diabetes and patient agrees:   - She did not bring any meter nor logs to review   for the third visit interval . This makes it difficult to adjust her therapy.  - I will continue  Tresiba  60 units qhs,  Novolog  15 units 3 times a day before meals for pre-meal blood glucose above 90 mg/dL. -She is given individualized chart to correct hypoglycemia but 150 mg/dL. - She may need higher dose of insulin once her commitment for proper monitoring her blood glucose is assured. -She is advised to continue strict monitoring of glucose  AC and HS. - Patient is warned not to take insulin without proper monitoring per orders.  -Patient is encouraged to call clinic for blood glucose levels less than 70 or above 300 mg /dl. - she is adamant saying that she does not tolerate MTF, Invokana, and Byetta.  - Patient specific target  A1c;  LDL, HDL, Triglycerides, and  Waist Circumference were discussed in detail.  2) BP/HTN: unControlled. Continue support restriction, she is not on medications. 3) Lipids/HPL: LDL  Levels at 200, I initiated Crestor 10 mg by mouth daily at bedtime.  4)  Weight/Diet: CDE consult in progress, exercise, and carbohydrates information  provided. She   5) hypothyroidism: -Responding to treatment, I will continue  levothyroxine 100 g by mouth every morning.  - We discussed about correct intake of levothyroxine, at fasting, with water, separated by at least 30 minutes from breakfast, and separated by more than 4 hours from calcium, iron, multivitamins, acid reflux medications (PPIs). -Patient is made aware of the fact that thyroid hormone replacement is needed for life, dose to be adjusted by periodic monitoring of thyroid function tests.  6) Chronic Care/Health Maintenance:  -Patient isinitiated on  Statin medications. She is encouraged to continue to follow up with  Ophthalmology, Podiatrist at least yearly or according to recommendations, and advised to stay away from smoking. I have recommended yearly flu vaccine and pneumonia vaccination at least every 5 years; moderate intensity exercise for up to 150 minutes weekly; and  sleep for at least 7 hours a day.   Patient to bring meter and  blood glucose logs during their next visit.   I advised patient to maintain close follow up with their PCP for primary care needs. Follow up plan: Return in about 3 months (around 01/02/2017) for follow up with pre-visit labs, meter, and logs.  Glade Lloyd, MD Phone: [phone]  Fax: [phone]   10/02/2016, 2:42 PM

## 2016-10-03 DIAGNOSIS — Z79899 Other long term (current) drug therapy: Secondary | ICD-10-CM | POA: Diagnosis not present

## 2016-10-03 DIAGNOSIS — L97523 Non-pressure chronic ulcer of other part of left foot with necrosis of muscle: Secondary | ICD-10-CM | POA: Diagnosis not present

## 2016-10-03 DIAGNOSIS — E11621 Type 2 diabetes mellitus with foot ulcer: Secondary | ICD-10-CM | POA: Diagnosis not present

## 2016-10-03 DIAGNOSIS — Z794 Long term (current) use of insulin: Secondary | ICD-10-CM | POA: Diagnosis not present

## 2016-10-03 DIAGNOSIS — I890 Lymphedema, not elsewhere classified: Secondary | ICD-10-CM | POA: Diagnosis not present

## 2016-10-08 DIAGNOSIS — L97529 Non-pressure chronic ulcer of other part of left foot with unspecified severity: Secondary | ICD-10-CM | POA: Diagnosis not present

## 2016-10-08 DIAGNOSIS — R600 Localized edema: Secondary | ICD-10-CM | POA: Diagnosis not present

## 2016-10-08 DIAGNOSIS — E11621 Type 2 diabetes mellitus with foot ulcer: Secondary | ICD-10-CM | POA: Diagnosis not present

## 2016-10-28 ENCOUNTER — Other Ambulatory Visit: Payer: Self-pay | Admitting: "Endocrinology

## 2016-11-04 DIAGNOSIS — E119 Type 2 diabetes mellitus without complications: Secondary | ICD-10-CM | POA: Diagnosis not present

## 2016-11-04 DIAGNOSIS — E7800 Pure hypercholesterolemia, unspecified: Secondary | ICD-10-CM | POA: Diagnosis not present

## 2016-11-18 DIAGNOSIS — E119 Type 2 diabetes mellitus without complications: Secondary | ICD-10-CM | POA: Diagnosis not present

## 2016-11-18 DIAGNOSIS — E7800 Pure hypercholesterolemia, unspecified: Secondary | ICD-10-CM | POA: Diagnosis not present

## 2017-01-07 ENCOUNTER — Other Ambulatory Visit: Payer: Self-pay | Admitting: "Endocrinology

## 2017-01-07 DIAGNOSIS — I10 Essential (primary) hypertension: Secondary | ICD-10-CM | POA: Diagnosis not present

## 2017-01-07 DIAGNOSIS — E11621 Type 2 diabetes mellitus with foot ulcer: Secondary | ICD-10-CM | POA: Diagnosis not present

## 2017-01-07 DIAGNOSIS — E118 Type 2 diabetes mellitus with unspecified complications: Secondary | ICD-10-CM | POA: Diagnosis not present

## 2017-01-07 DIAGNOSIS — E1165 Type 2 diabetes mellitus with hyperglycemia: Secondary | ICD-10-CM | POA: Diagnosis not present

## 2017-01-07 DIAGNOSIS — Z794 Long term (current) use of insulin: Secondary | ICD-10-CM | POA: Diagnosis not present

## 2017-01-07 DIAGNOSIS — E7800 Pure hypercholesterolemia, unspecified: Secondary | ICD-10-CM | POA: Diagnosis not present

## 2017-01-07 DIAGNOSIS — Z6841 Body Mass Index (BMI) 40.0 and over, adult: Secondary | ICD-10-CM | POA: Diagnosis not present

## 2017-01-07 DIAGNOSIS — L97509 Non-pressure chronic ulcer of other part of unspecified foot with unspecified severity: Secondary | ICD-10-CM | POA: Diagnosis not present

## 2017-01-07 DIAGNOSIS — Z299 Encounter for prophylactic measures, unspecified: Secondary | ICD-10-CM | POA: Diagnosis not present

## 2017-01-07 LAB — COMPREHENSIVE METABOLIC PANEL
ALK PHOS: 99 U/L (ref 33–115)
ALT: 12 U/L (ref 6–29)
AST: 11 U/L (ref 10–35)
Albumin: 3.3 g/dL — ABNORMAL LOW (ref 3.6–5.1)
BILIRUBIN TOTAL: 0.5 mg/dL (ref 0.2–1.2)
BUN: 20 mg/dL (ref 7–25)
CO2: 27 mmol/L (ref 20–31)
CREATININE: 1.11 mg/dL — AB (ref 0.50–1.10)
Calcium: 8.7 mg/dL (ref 8.6–10.2)
Chloride: 103 mmol/L (ref 98–110)
GLUCOSE: 217 mg/dL — AB (ref 65–99)
Potassium: 4.2 mmol/L (ref 3.5–5.3)
SODIUM: 137 mmol/L (ref 135–146)
Total Protein: 6.7 g/dL (ref 6.1–8.1)

## 2017-01-08 LAB — HEMOGLOBIN A1C
Hgb A1c MFr Bld: 9.8 % — ABNORMAL HIGH (ref <5.7)
Mean Plasma Glucose: 235 mg/dL

## 2017-01-14 ENCOUNTER — Encounter: Payer: Self-pay | Admitting: "Endocrinology

## 2017-01-14 ENCOUNTER — Ambulatory Visit (INDEPENDENT_AMBULATORY_CARE_PROVIDER_SITE_OTHER): Payer: Medicare Other | Admitting: "Endocrinology

## 2017-01-14 VITALS — BP 148/79 | HR 79 | Ht 67.5 in | Wt 337.0 lb

## 2017-01-14 DIAGNOSIS — E039 Hypothyroidism, unspecified: Secondary | ICD-10-CM

## 2017-01-14 DIAGNOSIS — IMO0002 Reserved for concepts with insufficient information to code with codable children: Secondary | ICD-10-CM

## 2017-01-14 DIAGNOSIS — I10 Essential (primary) hypertension: Secondary | ICD-10-CM | POA: Insufficient documentation

## 2017-01-14 DIAGNOSIS — E1165 Type 2 diabetes mellitus with hyperglycemia: Secondary | ICD-10-CM | POA: Diagnosis not present

## 2017-01-14 DIAGNOSIS — E118 Type 2 diabetes mellitus with unspecified complications: Secondary | ICD-10-CM

## 2017-01-14 DIAGNOSIS — Z794 Long term (current) use of insulin: Secondary | ICD-10-CM

## 2017-01-14 DIAGNOSIS — E782 Mixed hyperlipidemia: Secondary | ICD-10-CM

## 2017-01-14 MED ORDER — INSULIN DEGLUDEC 200 UNIT/ML ~~LOC~~ SOPN: 70.0000 [IU] | PEN_INJECTOR | Freq: Every day | SUBCUTANEOUS | 2 refills | Status: DC

## 2017-01-14 NOTE — Patient Instructions (Signed)

## 2017-01-14 NOTE — Progress Notes (Signed)
Subjective:    Patient ID: Michelle Skinner, female    DOB: May 16, 1966,    Past Medical History:  Diagnosis Date  . Cervical cancer ([place])   . Depression   . Diabetes mellitus, type II ([place])   . GERD (gastroesophageal reflux disease)   . Hyperlipidemia    Past Surgical History:  Procedure Laterality Date  . ABDOMINAL HYSTERECTOMY    . OTHER SURGICAL HISTORY     R Foot   Social History   Social History  . Marital status: Married    Spouse name: N/A  . Number of children: N/A  . Years of education: N/A   Social History Main Topics  . Smoking status: Never Smoker  . Smokeless tobacco: Never Used  . Alcohol use No  . Drug use: No  . Sexual activity: Not Asked   Other Topics Concern  . None   Social History Narrative  . None   Outpatient Encounter Prescriptions as of 01/14/2017  Medication Sig  . atorvastatin (LIPITOR) 20 MG tablet   . glucose blood test strip Use as instructed 4 x daily. E11.65  . insulin aspart (NOVOLOG FLEXPEN) 100 UNIT/ML FlexPen Inject 15-21 Units into the skin 3 (three) times daily with meals.  . Insulin Degludec (TRESIBA FLEXTOUCH) 200 UNIT/ML SOPN Inject 70 Units into the skin daily.  Marland Kitchen levothyroxine (SYNTHROID, LEVOTHROID) 100 MCG tablet TAKE 1 TABLET (100 MCG TOTAL) BY MOUTH DAILY.  Marland Kitchen lisinopril (PRINIVIL,ZESTRIL) 10 MG tablet Take 10 mg by mouth daily.  . pantoprazole (PROTONIX) 40 MG tablet Take 40 mg by mouth daily.  . pregabalin (LYRICA) 75 MG capsule Take 75 mg by mouth 2 (two) times daily.  . [DISCONTINUED] Insulin Degludec (TRESIBA FLEXTOUCH) 200 UNIT/ML SOPN Inject 60 Units into the skin daily.   No facility-administered encounter medications on file as of 01/14/2017.    ALLERGIES: Allergies  Allergen Reactions  . Bactrim [Sulfamethoxazole-Trimethoprim]   . Gabapentin   . Invokana [Canagliflozin]    VACCINATION STATUS:  There is no immunization history on file for this patient.  Diabetes  She presents for her follow-up  diabetic visit. She has type 2 diabetes mellitus. Onset time: She was diagnosed at approximate age of 89 years. Her disease course has been improving. There are no hypoglycemic associated symptoms. Pertinent negatives for hypoglycemia include no confusion, headaches, pallor or seizures. Associated symptoms include fatigue. Pertinent negatives for diabetes include no chest pain, no polydipsia, no polyphagia and no polyuria. There are no hypoglycemic complications. Symptoms are improving. Diabetic complications include peripheral neuropathy and retinopathy. Pertinent negatives for diabetic complications include no autonomic neuropathy. Risk factors for coronary artery disease include diabetes mellitus, dyslipidemia, hypertension, obesity and sedentary lifestyle. She is compliant with treatment most of the time. Her weight is increasing steadily. She is following a generally unhealthy diet. She has not had a previous visit with a dietitian (She missed her appointment.). She never participates in exercise. Her home blood glucose trend is increasing steadily. Her breakfast blood glucose range is generally >200 mg/dl. Her lunch blood glucose range is generally >200 mg/dl. Her dinner blood glucose range is generally >200 mg/dl. Her bedtime blood glucose range is generally >200 mg/dl. Her overall blood glucose range is >200 mg/dl. An ACE inhibitor/angiotensin II receptor blocker is not being taken. Eye exam is current.  Hyperlipidemia  This is a chronic problem. The current episode started more than 1 year ago. The problem is uncontrolled. Recent lipid tests were reviewed and are high. Exacerbating  diseases include diabetes, hypothyroidism and obesity. Pertinent negatives include no chest pain, myalgias or shortness of breath. She is currently on no antihyperlipidemic treatment.  Thyroid Problem  Presents for initial visit. Symptoms include fatigue. Patient reports no cold intolerance, diarrhea, heat intolerance or  palpitations. The following procedures have not been performed: radioiodine uptake scan and thyroidectomy. Her past medical history is significant for diabetes and hyperlipidemia.     Review of Systems  Constitutional: Positive for fatigue. Negative for unexpected weight change.  HENT: Negative for trouble swallowing and voice change.   Eyes: Negative for visual disturbance.  Respiratory: Negative for cough, shortness of breath and wheezing.   Cardiovascular: Negative for chest pain, palpitations and leg swelling.  Gastrointestinal: Negative for diarrhea, nausea and vomiting.  Endocrine: Negative for cold intolerance, heat intolerance, polydipsia, polyphagia and polyuria.  Musculoskeletal: Negative for arthralgias and myalgias.  Skin: Negative for color change, pallor, rash and wound.  Neurological: Negative for seizures and headaches.  Psychiatric/Behavioral: Negative for confusion and suicidal ideas.    Objective:    BP (!) 148/79   Pulse 79   Ht 5' 7.5" (1.715 m)   Wt (!) 337 lb (152.9 kg)   BMI 52.00 kg/m   Wt Readings from Last 3 Encounters:  01/14/17 (!) 337 lb (152.9 kg)  10/02/16 (!) 326 lb (147.9 kg)  07/03/16 (!) 323 lb (146.5 kg)    Physical Exam  Constitutional: She is oriented to person, place, and time. She appears well-developed.  She has poor hygiene generally.  HENT:  Head: Normocephalic and atraumatic.  She has poor dentition.  Eyes: EOM are normal.  Neck: Normal range of motion. Neck supple. No tracheal deviation present. No thyromegaly present.  Cardiovascular: Normal rate and regular rhythm.   Pulmonary/Chest: Effort normal and breath sounds normal.  Abdominal: Soft. Bowel sounds are normal. There is no tenderness. There is no guarding.  Musculoskeletal: Normal range of motion. She exhibits no edema.  Neurological: She is alert and oriented to person, place, and time. She has normal reflexes. No cranial nerve deficit. Coordination normal.  Skin: Skin  is warm and dry. No rash noted. No erythema. No pallor.  Psychiatric: She has a normal mood and affect. Judgment normal.  Reluctant affect .    Results for orders placed or performed in visit on 01/07/17  Comprehensive metabolic panel  Result Value Ref Range   Sodium 137 135 - 146 mmol/L   Potassium 4.2 3.5 - 5.3 mmol/L   Chloride 103 98 - 110 mmol/L   CO2 27 20 - 31 mmol/L   Glucose, Bld 217 (H) 65 - 99 mg/dL   BUN 20 7 - 25 mg/dL   Creat 1.11 (H) 0.50 - 1.10 mg/dL   Total Bilirubin 0.5 0.2 - 1.2 mg/dL   Alkaline Phosphatase 99 33 - 115 U/L   AST 11 10 - 35 U/L   ALT 12 6 - 29 U/L   Total Protein 6.7 6.1 - 8.1 g/dL   Albumin 3.3 (L) 3.6 - 5.1 g/dL   Calcium 8.7 8.6 - 10.2 mg/dL  Hemoglobin A1c  Result Value Ref Range   Hgb A1c MFr Bld 9.8 (H) <5.7 %   Mean Plasma Glucose 235 mg/dL   Diabetic Labs (most recent): Lab Results  Component Value Date   HGBA1C 9.8 (H) 01/07/2017   HGBA1C 11.3 (H) 09/12/2016   HGBA1C 9.2 (H) 06/25/2016    Lipid Panel     Component Value Date/Time   CHOL 285 (H) 04/25/2015  1001   TRIG 208 (H) 04/25/2015 1001   HDL 43 (L) 04/25/2015 1001   CHOLHDL 6.6 (H) 04/25/2015 1001   VLDL 42 (H) 04/25/2015 1001   LDLCALC 200 (H) 04/25/2015 1001     Assessment & Plan:   1. Uncontrolled type 2 diabetes mellitus with complication, with long-term current use of insulin ([place])   Her diabetes is complicated by neuropathy and possible retinopathy. - Patient has currently uncontrolled symptomatic type 2 DM of approximately since age 20 years duration. - She came with  Slight improvement in her A1c to 9.8% from  11.3%. -Patient remains at a high risk for more acute and chronic complications of diabetes which include CAD, CVA, CKD, retinopathy, and neuropathy. These are all discussed in detail with the patient.  - I have counseled the patient on diet management and weight loss, by adopting a carbohydrate restricted/protein rich diet.  - Patient is advised  to stick to a routine mealtimes to eat 3 meals  a day and avoid unnecessary snacks ( to snack only to correct hypoglycemia).   - Suggestion is made for patient to avoid simple carbohydrates   from her diet including Cakes , Desserts, Ice Cream,  Soda (  diet and regular) , Sweet Tea , Candies,  Chips, Cookies, Artificial Sweeteners,   and "Sugar-free" Products . This will help patient to have stable blood glucose profile and potentially avoid unintended weight gain.  - I encouraged the patient to switch to  unprocessed or minimally processed complex starch and increased protein intake (animal or plant source), fruits, and vegetables.  - The patient will be scheduled with Jearld Fenton, RDN, CDE for individualized DM education.  - I have approached patient with the following individualized plan to manage diabetes and patient agrees:   - She  has been much better than before regarding monitoring and in insulin administration.  - I will increased   Tresiba  To 70 units qhs,   continue Novolog  15 units 3 times a day before meals for pre-meal blood glucose above 90 mg/dL. -She is given individualized chart to correct hypoglycemia but 150 mg/dL. - She may need higher dose of insulin once her commitment for proper monitoring her blood glucose is assured. -She is advised to continue strict monitoring of glucose  AC and HS. - Patient is warned not to take insulin without proper monitoring per orders.  -Patient is encouraged to call clinic for blood glucose levels less than 70 or above 300 mg /dl. - she is adamant saying that she does not tolerate MTF, Invokana, and Byetta.  - Patient specific target  A1c;  LDL, HDL, Triglycerides, and  Waist Circumference were discussed in detail.  2) BP/HTN: unControlled. Continue support restriction, she is not on medications. 3) Lipids/HPL: LDL  Levels at 200, I advised her to continue Crestor 10 mg by mouth daily at bedtime.  4)  Weight/Diet: she keeps missing  her appointment with  CDE exercise, and carbohydrates information provided. She   5) hypothyroidism: -Responding to treatment, I will continue  levothyroxine 100 g by mouth every morning.  - We discussed about correct intake of levothyroxine, at fasting, with water, separated by at least 30 minutes from breakfast, and separated by more than 4 hours from calcium, iron, multivitamins, acid reflux medications (PPIs). -Patient is made aware of the fact that thyroid hormone replacement is needed for life, dose to be adjusted by periodic monitoring of thyroid function tests.  6) Chronic Care/Health Maintenance:  -  Patient isinitiated on  Statin medications. She is encouraged to continue to follow up with Ophthalmology, Podiatrist at least yearly or according to recommendations, and advised to stay away from smoking. I have recommended yearly flu vaccine and pneumonia vaccination at least every 5 years; moderate intensity exercise for up to 150 minutes weekly; and  sleep for at least 7 hours a day.   Patient to bring meter and  blood glucose logs during her next visit.   I advised patient to maintain close follow up with their PCP for primary care needs. Follow up plan: Return in about 3 months (around 04/16/2017) for meter, and logs.  Glade Lloyd, MD Phone: [phone]  Fax: [phone]   01/14/2017, 2:58 PM

## 2017-01-16 ENCOUNTER — Other Ambulatory Visit: Payer: Self-pay | Admitting: "Endocrinology

## 2017-01-22 ENCOUNTER — Other Ambulatory Visit: Payer: Self-pay

## 2017-01-22 MED ORDER — GLUCOSE BLOOD VI STRP: ORAL_STRIP | 5 refills | Status: DC

## 2017-01-23 ENCOUNTER — Other Ambulatory Visit: Payer: Self-pay

## 2017-01-23 MED ORDER — GLUCOSE BLOOD VI STRP: ORAL_STRIP | 5 refills | Status: DC

## 2017-01-24 ENCOUNTER — Other Ambulatory Visit: Payer: Self-pay

## 2017-01-24 MED ORDER — GLUCOSE BLOOD VI STRP: ORAL_STRIP | 5 refills | Status: DC

## 2017-01-24 MED ORDER — INSULIN DEGLUDEC 200 UNIT/ML ~~LOC~~ SOPN: 70.0000 [IU] | PEN_INJECTOR | Freq: Every day | SUBCUTANEOUS | 2 refills | Status: DC

## 2017-01-28 ENCOUNTER — Other Ambulatory Visit: Payer: Self-pay

## 2017-01-28 MED ORDER — LANCETS ULTRA THIN MISC: 2 refills | Status: DC

## 2017-03-02 DIAGNOSIS — E119 Type 2 diabetes mellitus without complications: Secondary | ICD-10-CM | POA: Diagnosis not present

## 2017-03-02 DIAGNOSIS — S0990XA Unspecified injury of head, initial encounter: Secondary | ICD-10-CM | POA: Diagnosis not present

## 2017-03-02 DIAGNOSIS — W01190A Fall on same level from slipping, tripping and stumbling with subsequent striking against furniture, initial encounter: Secondary | ICD-10-CM | POA: Diagnosis not present

## 2017-03-02 DIAGNOSIS — R42 Dizziness and giddiness: Secondary | ICD-10-CM | POA: Diagnosis not present

## 2017-03-02 DIAGNOSIS — S0003XA Contusion of scalp, initial encounter: Secondary | ICD-10-CM | POA: Diagnosis not present

## 2017-03-02 DIAGNOSIS — M25511 Pain in right shoulder: Secondary | ICD-10-CM | POA: Diagnosis not present

## 2017-03-02 DIAGNOSIS — S0083XA Contusion of other part of head, initial encounter: Secondary | ICD-10-CM | POA: Diagnosis not present

## 2017-03-02 DIAGNOSIS — Z794 Long term (current) use of insulin: Secondary | ICD-10-CM | POA: Diagnosis not present

## 2017-03-02 DIAGNOSIS — Z79899 Other long term (current) drug therapy: Secondary | ICD-10-CM | POA: Diagnosis not present

## 2017-03-27 ENCOUNTER — Other Ambulatory Visit: Payer: Self-pay

## 2017-03-27 MED ORDER — LEVOTHYROXINE SODIUM 100 MCG PO TABS: 100.0000 ug | ORAL_TABLET | Freq: Every day | ORAL | 1 refills | Status: DC

## 2017-04-09 DIAGNOSIS — E118 Type 2 diabetes mellitus with unspecified complications: Secondary | ICD-10-CM | POA: Diagnosis not present

## 2017-04-09 DIAGNOSIS — E782 Mixed hyperlipidemia: Secondary | ICD-10-CM | POA: Diagnosis not present

## 2017-04-09 DIAGNOSIS — E039 Hypothyroidism, unspecified: Secondary | ICD-10-CM | POA: Diagnosis not present

## 2017-04-09 DIAGNOSIS — Z794 Long term (current) use of insulin: Secondary | ICD-10-CM | POA: Diagnosis not present

## 2017-04-09 DIAGNOSIS — E1165 Type 2 diabetes mellitus with hyperglycemia: Secondary | ICD-10-CM | POA: Diagnosis not present

## 2017-04-10 LAB — RENAL FUNCTION PANEL
Albumin: 3.4 g/dL — ABNORMAL LOW (ref 3.6–5.1)
BUN: 18 mg/dL (ref 7–25)
CHLORIDE: 100 mmol/L (ref 98–110)
CO2: 28 mmol/L (ref 20–32)
Calcium: 9.2 mg/dL (ref 8.6–10.2)
Creat: 1.01 mg/dL (ref 0.50–1.10)
Glucose, Bld: 336 mg/dL — ABNORMAL HIGH (ref 65–99)
PHOSPHORUS: 3.8 mg/dL (ref 2.5–4.5)
POTASSIUM: 5.1 mmol/L (ref 3.5–5.3)
Sodium: 136 mmol/L (ref 135–146)

## 2017-04-10 LAB — LIPID PANEL
Cholesterol: 166 mg/dL (ref <200)
HDL: 37 mg/dL — ABNORMAL LOW (ref >50)
LDL CHOLESTEROL (CALC): 106 mg/dL — AB
NON-HDL CHOLESTEROL (CALC): 129 mg/dL (ref <130)
Total CHOL/HDL Ratio: 4.5 (calc) (ref <5.0)
Triglycerides: 131 mg/dL (ref <150)

## 2017-04-10 LAB — HEMOGLOBIN A1C
Hgb A1c MFr Bld: 10.2 % of total Hgb — ABNORMAL HIGH (ref <5.7)
Mean Plasma Glucose: 246 (calc)
eAG (mmol/L): 13.6 (calc)

## 2017-04-10 LAB — T4, FREE: Free T4: 1.5 ng/dL (ref 0.8–1.8)

## 2017-04-10 LAB — TSH: TSH: 1.85 m[IU]/L

## 2017-04-17 ENCOUNTER — Ambulatory Visit (INDEPENDENT_AMBULATORY_CARE_PROVIDER_SITE_OTHER): Payer: Medicare Other | Admitting: "Endocrinology

## 2017-04-17 ENCOUNTER — Encounter: Payer: Self-pay | Admitting: "Endocrinology

## 2017-04-17 VITALS — BP 148/88 | HR 80 | Ht 67.5 in | Wt 353.0 lb

## 2017-04-17 DIAGNOSIS — E039 Hypothyroidism, unspecified: Secondary | ICD-10-CM | POA: Diagnosis not present

## 2017-04-17 DIAGNOSIS — E782 Mixed hyperlipidemia: Secondary | ICD-10-CM

## 2017-04-17 DIAGNOSIS — Z794 Long term (current) use of insulin: Secondary | ICD-10-CM | POA: Diagnosis not present

## 2017-04-17 DIAGNOSIS — E1165 Type 2 diabetes mellitus with hyperglycemia: Secondary | ICD-10-CM | POA: Diagnosis not present

## 2017-04-17 DIAGNOSIS — E118 Type 2 diabetes mellitus with unspecified complications: Secondary | ICD-10-CM | POA: Diagnosis not present

## 2017-04-17 DIAGNOSIS — IMO0002 Reserved for concepts with insufficient information to code with codable children: Secondary | ICD-10-CM

## 2017-04-17 MED ORDER — INSULIN DEGLUDEC 200 UNIT/ML ~~LOC~~ SOPN: 80.0000 [IU] | PEN_INJECTOR | Freq: Every day | SUBCUTANEOUS | 0 refills | Status: DC

## 2017-04-17 MED ORDER — INSULIN ASPART 100 UNIT/ML FLEXPEN: 15.0000 [IU] | PEN_INJECTOR | Freq: Three times a day (TID) | SUBCUTANEOUS | 0 refills | Status: DC

## 2017-04-17 MED ORDER — ONETOUCH ULTRA 2 W/DEVICE KIT: PACK | 0 refills | Status: DC

## 2017-04-17 NOTE — Progress Notes (Signed)
Subjective:    Patient ID: Michelle Skinner, female    DOB: 28-Dec-1965,    Past Medical History:  Diagnosis Date  . Cervical cancer ([place])   . Depression   . Diabetes mellitus, type II ([place])   . GERD (gastroesophageal reflux disease)   . Hyperlipidemia    Past Surgical History:  Procedure Laterality Date  . ABDOMINAL HYSTERECTOMY    . OTHER SURGICAL HISTORY     R Foot   Social History   Social History  . Marital status: Married    Spouse name: N/A  . Number of children: N/A  . Years of education: N/A   Social History Main Topics  . Smoking status: Never Smoker  . Smokeless tobacco: Never Used  . Alcohol use No  . Drug use: No  . Sexual activity: Not Asked   Other Topics Concern  . None   Social History Narrative  . None   Outpatient Encounter Prescriptions as of 04/17/2017  Medication Sig  . atorvastatin (LIPITOR) 20 MG tablet   . Blood Glucose Monitoring Suppl (ONE TOUCH ULTRA 2) w/Device KIT Use to test blood glucose  . glucose blood test strip Use as instructed 4 x daily. E11.65  . insulin aspart (NOVOLOG FLEXPEN) 100 UNIT/ML FlexPen Inject 15-21 Units into the skin 3 (three) times daily with meals.  . Insulin Degludec (TRESIBA FLEXTOUCH) 200 UNIT/ML SOPN Inject 80 Units into the skin daily.  Marland Kitchen LANCETS ULTRA THIN MISC Use as directed qid. E11.65  . levothyroxine (SYNTHROID, LEVOTHROID) 100 MCG tablet Take 1 tablet (100 mcg total) by mouth daily.  Marland Kitchen lisinopril (PRINIVIL,ZESTRIL) 10 MG tablet Take 10 mg by mouth daily.  . pantoprazole (PROTONIX) 40 MG tablet Take 40 mg by mouth daily.  . pregabalin (LYRICA) 75 MG capsule Take 75 mg by mouth 2 (two) times daily.  . [DISCONTINUED] insulin aspart (NOVOLOG FLEXPEN) 100 UNIT/ML FlexPen Inject 15-21 Units into the skin 3 (three) times daily with meals.  . [DISCONTINUED] Insulin Degludec (TRESIBA FLEXTOUCH) 200 UNIT/ML SOPN Inject 70 Units into the skin daily.  . [DISCONTINUED] NOVOLOG FLEXPEN 100 UNIT/ML FlexPen  INJECT 10-16 UNITS INTO THE SKIN 3 (THREE) TIMES DAILY WITH MEALS.   No facility-administered encounter medications on file as of 04/17/2017.    ALLERGIES: Allergies  Allergen Reactions  . Bactrim [Sulfamethoxazole-Trimethoprim]   . Gabapentin   . Invokana [Canagliflozin]    VACCINATION STATUS:  There is no immunization history on file for this patient.  Diabetes  She presents for her follow-up diabetic visit. She has type 2 diabetes mellitus. Onset time: She was diagnosed at approximate age of 67 years. Her disease course has been worsening. There are no hypoglycemic associated symptoms. Pertinent negatives for hypoglycemia include no confusion, headaches, pallor or seizures. Associated symptoms include fatigue. Pertinent negatives for diabetes include no chest pain, no polydipsia, no polyphagia and no polyuria. There are no hypoglycemic complications. Symptoms are worsening. Diabetic complications include peripheral neuropathy and retinopathy. Pertinent negatives for diabetic complications include no autonomic neuropathy. Risk factors for coronary artery disease include diabetes mellitus, dyslipidemia, hypertension, obesity and sedentary lifestyle. She is compliant with treatment most of the time. Her weight is increasing steadily. She is following a generally unhealthy diet. She has not had a previous visit with a dietitian (She missed her appointment.). She never participates in exercise. Her home blood glucose trend is increasing steadily. Her breakfast blood glucose range is generally >200 mg/dl. Her lunch blood glucose range is generally >200 mg/dl.  Her dinner blood glucose range is generally >200 mg/dl. Her bedtime blood glucose range is generally >200 mg/dl. Her overall blood glucose range is >200 mg/dl. (She has only 34 readings in the last 30 days average in 24 A1c increasing to 10.2%, however she has documented on her logs as if she is monitoring 4 times a day ,.she does not have another  meter.) An ACE inhibitor/angiotensin II receptor blocker is not being taken. Eye exam is current.  Hyperlipidemia  This is a chronic problem. The current episode started more than 1 year ago. The problem is uncontrolled. Recent lipid tests were reviewed and are high. Exacerbating diseases include diabetes, hypothyroidism and obesity. Pertinent negatives include no chest pain, myalgias or shortness of breath. She is currently on no antihyperlipidemic treatment.  Thyroid Problem  Presents for initial visit. Symptoms include fatigue. Patient reports no cold intolerance, diarrhea, heat intolerance or palpitations. The following procedures have not been performed: radioiodine uptake scan and thyroidectomy. Her past medical history is significant for diabetes and hyperlipidemia.     Review of Systems  Constitutional: Positive for fatigue. Negative for unexpected weight change.  HENT: Negative for trouble swallowing and voice change.   Eyes: Negative for visual disturbance.  Respiratory: Negative for cough, shortness of breath and wheezing.   Cardiovascular: Negative for chest pain, palpitations and leg swelling.  Gastrointestinal: Negative for diarrhea, nausea and vomiting.  Endocrine: Negative for cold intolerance, heat intolerance, polydipsia, polyphagia and polyuria.  Musculoskeletal: Negative for arthralgias and myalgias.  Skin: Negative for color change, pallor, rash and wound.  Neurological: Negative for seizures and headaches.  Psychiatric/Behavioral: Negative for confusion and suicidal ideas.    Objective:    BP (!) 148/88   Pulse 80   Ht 5' 7.5" (1.715 m)   Wt (!) 353 lb (160.1 kg)   BMI 54.47 kg/m   Wt Readings from Last 3 Encounters:  04/17/17 (!) 353 lb (160.1 kg)  01/14/17 (!) 337 lb (152.9 kg)  10/02/16 (!) 326 lb (147.9 kg)    Physical Exam  Constitutional: She is oriented to person, place, and time. She appears well-developed.  She has poor hygiene generally.  HENT:   Head: Normocephalic and atraumatic.  She has poor dentition.  Eyes: EOM are normal.  Neck: Normal range of motion. Neck supple. No tracheal deviation present. No thyromegaly present.  Cardiovascular: Normal rate and regular rhythm.   Pulmonary/Chest: Effort normal and breath sounds normal.  Abdominal: Soft. Bowel sounds are normal. There is no tenderness. There is no guarding.  Musculoskeletal: Normal range of motion. She exhibits no edema.  Neurological: She is alert and oriented to person, place, and time. She has normal reflexes. No cranial nerve deficit. Coordination normal.  Skin: Skin is warm and dry. No rash noted. No erythema. No pallor.  Psychiatric: She has a normal mood and affect. Judgment normal.  Reluctant affect .    Results for orders placed or performed in visit on 01/14/17  Renal function panel  Result Value Ref Range   Glucose, Bld 336 (H) 65 - 99 mg/dL   BUN 18 7 - 25 mg/dL   Creat 1.01 0.50 - 1.10 mg/dL   BUN/Creatinine Ratio NOT APPLICABLE 6 - 22 (calc)   Sodium 136 135 - 146 mmol/L   Potassium 5.1 3.5 - 5.3 mmol/L   Chloride 100 98 - 110 mmol/L   CO2 28 20 - 32 mmol/L   Calcium 9.2 8.6 - 10.2 mg/dL   Phosphorus 3.8 2.5 - 4.5  mg/dL   Albumin 3.4 (L) 3.6 - 5.1 g/dL  Hemoglobin A1c  Result Value Ref Range   Hgb A1c MFr Bld 10.2 (H) <5.7 % of total Hgb   Mean Plasma Glucose 246 (calc)   eAG (mmol/L) 13.6 (calc)  T4, free  Result Value Ref Range   Free T4 1.5 0.8 - 1.8 ng/dL  TSH  Result Value Ref Range   TSH 1.85 mIU/L  Lipid panel  Result Value Ref Range   Cholesterol 166 <200 mg/dL   HDL 37 (L) >50 mg/dL   Triglycerides 131 <150 mg/dL   LDL Cholesterol (Calc) 106 (H) mg/dL (calc)   Total CHOL/HDL Ratio 4.5 <5.0 (calc)   Non-HDL Cholesterol (Calc) 129 <130 mg/dL (calc)   Diabetic Labs (most recent): Lab Results  Component Value Date   HGBA1C 10.2 (H) 04/09/2017   HGBA1C 9.8 (H) 01/07/2017   HGBA1C 11.3 (H) 09/12/2016    Lipid Panel      Component Value Date/Time   CHOL 166 04/09/2017 1159   TRIG 131 04/09/2017 1159   HDL 37 (L) 04/09/2017 1159   CHOLHDL 4.5 04/09/2017 1159   VLDL 42 (H) 04/25/2015 1001   LDLCALC 200 (H) 04/25/2015 1001     Assessment & Plan:   1. Uncontrolled type 2 diabetes mellitus with complication, with long-term current use of insulin ([place])   Her diabetes is complicated by neuropathy and possible retinopathy. - Patient has currently uncontrolled symptomatic type 2 DM of approximately since age 51 years duration. - She came with  Worsening blood glucose readings and a1c higher at 10.2% from 9.8%.  -Patient remains at a high risk for more acute and chronic complications of diabetes which include CAD, CVA, CKD, retinopathy, and neuropathy. These are all discussed in detail with the patient.  - I have counseled the patient on diet management and weight loss, by adopting a carbohydrate restricted/protein rich diet.  - Patient is advised to stick to a routine mealtimes to eat 3 meals  a day and avoid unnecessary snacks ( to snack only to correct hypoglycemia).   -  Suggestion is made for her to avoid simple carbohydrates  from her diet including Cakes, Sweet Desserts / Pastries, Ice Cream, Soda (diet and regular), Sweet Tea, Candies, Chips, Cookies, Store Bought Juices, Alcohol in Excess of  1-2 drinks a day, Artificial Sweeteners, and "Sugar-free" Products. This will help patient to have stable blood glucose profile and potentially avoid unintended weight gain.   - I encouraged the patient to switch to  unprocessed or minimally processed complex starch and increased protein intake (animal or plant source), fruits, and vegetables.   - I have approached patient with the following individualized plan to manage diabetes and patient agrees:   - She has a documented blood glucose readings  on her logs which are  not in her meter. - Average blood glucose is 294 for the last 30 days. - I will increase    Tresiba  to  80 units daily at bedtime,   continue Novolog  15 units 3 times a day before meals for pre-meal blood glucose above 90 mg/dL. -She is given individualized chart to correct hypoglycemia but 150 mg/dL. - She may need higher dose of insulin once her commitment for proper monitoring her blood glucose is assured. -She is advised to continue strict monitoring of glucose  4 times a day-before meals and at bedtime.  - Patient is warned not to take insulin without proper monitoring per orders.  -  Patient is encouraged to call clinic for blood glucose levels less than 70 or above 300 mg /dl. - she is adamant saying that she does not tolerate MTF, Invokana, and [place].  - Patient specific target  A1c;  LDL, HDL, Triglycerides, and  Waist Circumference were discussed in detail.  2) BP/HTN: unControlled. Continue support restriction, she is not on medications. 3) Lipids/HPL: LDL  Levels at 200, I advised her to continue Crestor 10 mg by mouth daily at bedtime.  4)  Weight/Diet: she keeps missing her appointment with  CDE exercise, and carbohydrates information provided. She   5) hypothyroidism: -Responding to treatment, I will continue  levothyroxine 100 g by mouth every morning.  - We discussed about correct intake of levothyroxine, at fasting, with water, separated by at least 30 minutes from breakfast, and separated by more than 4 hours from calcium, iron, multivitamins, acid reflux medications (PPIs). -Patient is made aware of the fact that thyroid hormone replacement is needed for life, dose to be adjusted by periodic monitoring of thyroid function tests.  6) Chronic Care/Health Maintenance:  -Patient isinitiated on  Statin medications. She is encouraged to continue to follow up with Ophthalmology, Podiatrist at least yearly or according to recommendations, and advised to stay away from smoking. I have recommended yearly flu vaccine and pneumonia vaccination at least every 5 years;  moderate intensity exercise for up to 150 minutes weekly; and  sleep for at least 7 hours a day.  - Time spent with the patient: 25 min, of which >50% was spent in reviewing her sugar logs , discussing her hypo- and hyper-glycemic episodes, reviewing her current and  previous labs and insulin doses and developing a plan to avoid hypo- and hyper-glycemia.   I advised patient to maintain close follow up with their PCP for primary care needs. Follow up plan: Return in about 3 months (around 07/18/2017) for meter, and logs.  Glade Lloyd, MD Phone: [phone]  Fax: [phone]  -  This note was partially dictated with voice recognition software. Similar sounding words can be transcribed inadequately or may not  be corrected upon review.  04/17/2017, 3:02 PM

## 2017-05-14 ENCOUNTER — Other Ambulatory Visit: Payer: Self-pay

## 2017-05-14 MED ORDER — PANTOPRAZOLE SODIUM 40 MG PO TBEC: 40.0000 mg | DELAYED_RELEASE_TABLET | Freq: Every day | ORAL | 0 refills | Status: DC

## 2017-05-14 MED ORDER — LISINOPRIL 10 MG PO TABS: 10.0000 mg | ORAL_TABLET | Freq: Every day | ORAL | 0 refills | Status: DC

## 2017-05-14 MED ORDER — ATORVASTATIN CALCIUM 20 MG PO TABS: 20.0000 mg | ORAL_TABLET | Freq: Every day | ORAL | 0 refills | Status: DC

## 2017-05-14 MED ORDER — LEVOTHYROXINE SODIUM 100 MCG PO TABS: 100.0000 ug | ORAL_TABLET | Freq: Every day | ORAL | 1 refills | Status: DC

## 2017-05-15 ENCOUNTER — Other Ambulatory Visit: Payer: Self-pay

## 2017-05-15 MED ORDER — INSULIN LISPRO 100 UNIT/ML (KWIKPEN): 15.0000 [IU] | PEN_INJECTOR | Freq: Three times a day (TID) | SUBCUTANEOUS | 0 refills | Status: DC

## 2017-06-21 ENCOUNTER — Other Ambulatory Visit: Payer: Self-pay | Admitting: "Endocrinology

## 2017-06-30 ENCOUNTER — Other Ambulatory Visit: Payer: Self-pay | Admitting: "Endocrinology

## 2017-07-02 ENCOUNTER — Other Ambulatory Visit: Payer: Self-pay | Admitting: "Endocrinology

## 2017-07-14 ENCOUNTER — Other Ambulatory Visit: Payer: Self-pay | Admitting: "Endocrinology

## 2017-07-15 LAB — HEMOGLOBIN A1C
Hgb A1c MFr Bld: 9.2 % of total Hgb — ABNORMAL HIGH (ref <5.7)
Mean Plasma Glucose: 217 (calc)
eAG (mmol/L): 12 (calc)

## 2017-07-15 LAB — COMPLETE METABOLIC PANEL WITH GFR
AG RATIO: 1 (calc) (ref 1.0–2.5)
ALBUMIN MSPROF: 3.2 g/dL — AB (ref 3.6–5.1)
ALT: 12 U/L (ref 6–29)
AST: 11 U/L (ref 10–35)
Alkaline phosphatase (APISO): 97 U/L (ref 33–130)
BUN: 14 mg/dL (ref 7–25)
CALCIUM: 8.8 mg/dL (ref 8.6–10.4)
CO2: 27 mmol/L (ref 20–32)
CREATININE: 0.94 mg/dL (ref 0.50–1.05)
Chloride: 104 mmol/L (ref 98–110)
GFR, EST NON AFRICAN AMERICAN: 71 mL/min/{1.73_m2} (ref >=60)
GFR, Est African American: 82 mL/min/{1.73_m2} (ref >=60)
GLOBULIN: 3.3 g/dL (ref 1.9–3.7)
Glucose, Bld: 149 mg/dL — ABNORMAL HIGH (ref 65–139)
POTASSIUM: 4.4 mmol/L (ref 3.5–5.3)
SODIUM: 139 mmol/L (ref 135–146)
Total Bilirubin: 0.7 mg/dL (ref 0.2–1.2)
Total Protein: 6.5 g/dL (ref 6.1–8.1)

## 2017-07-18 ENCOUNTER — Ambulatory Visit (INDEPENDENT_AMBULATORY_CARE_PROVIDER_SITE_OTHER): Payer: Medicare Other | Admitting: "Endocrinology

## 2017-07-18 ENCOUNTER — Encounter: Payer: Self-pay | Admitting: "Endocrinology

## 2017-07-18 VITALS — BP 142/93 | HR 84 | Ht 67.5 in | Wt 346.0 lb

## 2017-07-18 DIAGNOSIS — E782 Mixed hyperlipidemia: Secondary | ICD-10-CM | POA: Diagnosis not present

## 2017-07-18 DIAGNOSIS — E039 Hypothyroidism, unspecified: Secondary | ICD-10-CM | POA: Diagnosis not present

## 2017-07-18 DIAGNOSIS — IMO0002 Reserved for concepts with insufficient information to code with codable children: Secondary | ICD-10-CM

## 2017-07-18 DIAGNOSIS — E118 Type 2 diabetes mellitus with unspecified complications: Secondary | ICD-10-CM

## 2017-07-18 DIAGNOSIS — E1165 Type 2 diabetes mellitus with hyperglycemia: Secondary | ICD-10-CM | POA: Diagnosis not present

## 2017-07-18 DIAGNOSIS — I10 Essential (primary) hypertension: Secondary | ICD-10-CM

## 2017-07-18 DIAGNOSIS — Z794 Long term (current) use of insulin: Secondary | ICD-10-CM

## 2017-07-18 MED ORDER — LEVOTHYROXINE SODIUM 100 MCG PO TABS: 100.0000 ug | ORAL_TABLET | Freq: Every day | ORAL | 1 refills | Status: DC

## 2017-07-18 MED ORDER — INSULIN LISPRO 100 UNIT/ML (KWIKPEN): PEN_INJECTOR | SUBCUTANEOUS | 1 refills | Status: DC

## 2017-07-18 MED ORDER — ATORVASTATIN CALCIUM 20 MG PO TABS: ORAL_TABLET | ORAL | 0 refills | Status: DC

## 2017-07-18 MED ORDER — LISINOPRIL 10 MG PO TABS: 10.0000 mg | ORAL_TABLET | Freq: Every day | ORAL | 0 refills | Status: DC

## 2017-07-18 MED ORDER — GLUCOSE BLOOD VI STRP: ORAL_STRIP | 2 refills | Status: DC

## 2017-07-18 NOTE — Progress Notes (Signed)
Subjective:    Patient ID: Michelle Skinner, female    DOB: 12/30/1965,    Past Medical History:  Diagnosis Date  . Cervical cancer ([place])   . Depression   . Diabetes mellitus, type II ([place])   . GERD (gastroesophageal reflux disease)   . Hyperlipidemia    Past Surgical History:  Procedure Laterality Date  . ABDOMINAL HYSTERECTOMY    . OTHER SURGICAL HISTORY     R Foot   Social History   Socioeconomic History  . Marital status: Married    Spouse name: None  . Number of children: None  . Years of education: None  . Highest education level: None  Social Needs  . Financial resource strain: None  . Food insecurity - worry: None  . Food insecurity - inability: None  . Transportation needs - medical: None  . Transportation needs - non-medical: None  Occupational History  . None  Tobacco Use  . Smoking status: Never Smoker  . Smokeless tobacco: Never Used  Substance and Sexual Activity  . Alcohol use: No    Alcohol/week: 0.0 oz  . Drug use: No  . Sexual activity: None  Other Topics Concern  . None  Social History Narrative  . None   Outpatient Encounter Medications as of 07/18/2017  Medication Sig  . atorvastatin (LIPITOR) 20 MG tablet TAKE 1 TABLET DAILY AT 6 PM  . Blood Glucose Monitoring Suppl (ONE TOUCH ULTRA 2) w/Device KIT Use to test blood glucose  . glucose blood (ONE TOUCH ULTRA TEST) test strip USE TO TEST BLOOD GLUCOSE 4 x daily E11.65  . insulin lispro (HUMALOG KWIKPEN) 100 UNIT/ML KiwkPen INJECT SUBCUTANEOUSLY 18 TO 24 UNITS TOTAL 3 TIMES  DAILY BEFORE MEALS  . LANCETS ULTRA THIN MISC Use as directed qid. E11.65  . levothyroxine (SYNTHROID, LEVOTHROID) 100 MCG tablet Take 1 tablet (100 mcg total) by mouth daily.  Marland Kitchen lisinopril (PRINIVIL,ZESTRIL) 10 MG tablet Take 1 tablet (10 mg total) by mouth daily.  . pantoprazole (PROTONIX) 40 MG tablet TAKE 1 TABLET DAILY  . pregabalin (LYRICA) 75 MG capsule Take 75 mg by mouth 2 (two) times daily.  . TRESIBA  FLEXTOUCH 200 UNIT/ML SOPN INJECT 80 UNITS  SUBCUTANEOUSLY DAILY  . [DISCONTINUED] atorvastatin (LIPITOR) 20 MG tablet TAKE 1 TABLET DAILY AT 6 PM  . [DISCONTINUED] glucose blood (ONE TOUCH ULTRA TEST) test strip USE TO TEST BLOOD GLUCOSE 4 x daily E11.65  . [DISCONTINUED] HUMALOG KWIKPEN 100 UNIT/ML KiwkPen INJECT SUBCUTANEOUSLY 15 TO 21 UNITS TOTAL 3 TIMES  DAILY BEFORE MEALS  . [DISCONTINUED] insulin aspart (NOVOLOG FLEXPEN) 100 UNIT/ML FlexPen Inject 15-21 Units into the skin 3 (three) times daily with meals.  . [DISCONTINUED] levothyroxine (SYNTHROID, LEVOTHROID) 100 MCG tablet Take 1 tablet (100 mcg total) daily by mouth.  . [DISCONTINUED] lisinopril (PRINIVIL,ZESTRIL) 10 MG tablet TAKE 1 TABLET DAILY BY  MOUTH.   No facility-administered encounter medications on file as of 07/18/2017.    ALLERGIES: Allergies  Allergen Reactions  . Bactrim [Sulfamethoxazole-Trimethoprim]   . Gabapentin   . Invokana [Canagliflozin]    VACCINATION STATUS:  There is no immunization history on file for this patient.  Diabetes  She presents for her follow-up diabetic visit. She has type 2 diabetes mellitus. Onset time: She was diagnosed at approximate age of 73 years. Her disease course has been improving. There are no hypoglycemic associated symptoms. Pertinent negatives for hypoglycemia include no confusion, headaches, pallor or seizures. Associated symptoms include fatigue, polydipsia and polyuria.  Pertinent negatives for diabetes include no chest pain and no polyphagia. There are no hypoglycemic complications. Symptoms are improving. Diabetic complications include peripheral neuropathy and retinopathy. Pertinent negatives for diabetic complications include no autonomic neuropathy. Risk factors for coronary artery disease include diabetes mellitus, dyslipidemia, hypertension, obesity and sedentary lifestyle. She is compliant with treatment most of the time. Her weight is decreasing steadily. She is following  a generally unhealthy diet. She has not had a previous visit with a dietitian (She missed her appointment.). She never participates in exercise. Her home blood glucose trend is increasing steadily. Her breakfast blood glucose range is generally >200 mg/dl. Her lunch blood glucose range is generally >200 mg/dl. Her dinner blood glucose range is generally >200 mg/dl. Her bedtime blood glucose range is generally >200 mg/dl. Her overall blood glucose range is >200 mg/dl. (She is not compliant for monitoring. She has 9 readings in the last 7 days averaging 249, however she feels Her logs blood glucose readings as if she is monitoring 4 times a day.) An ACE inhibitor/angiotensin II receptor blocker is not being taken. Eye exam is current.  Hyperlipidemia  This is a chronic problem. The current episode started more than 1 year ago. The problem is uncontrolled. Recent lipid tests were reviewed and are high. Exacerbating diseases include diabetes, hypothyroidism and obesity. Pertinent negatives include no chest pain, myalgias or shortness of breath. She is currently on no antihyperlipidemic treatment.  Thyroid Problem  Presents for initial visit. Symptoms include fatigue. Patient reports no cold intolerance, diarrhea, heat intolerance or palpitations. The following procedures have not been performed: radioiodine uptake scan and thyroidectomy. Her past medical history is significant for diabetes and hyperlipidemia.     Review of Systems  Constitutional: Positive for fatigue. Negative for unexpected weight change.  HENT: Negative for trouble swallowing and voice change.   Eyes: Negative for visual disturbance.  Respiratory: Negative for cough, shortness of breath and wheezing.   Cardiovascular: Negative for chest pain, palpitations and leg swelling.  Gastrointestinal: Negative for diarrhea, nausea and vomiting.  Endocrine: Positive for polydipsia and polyuria. Negative for cold intolerance, heat intolerance and  polyphagia.  Musculoskeletal: Negative for arthralgias and myalgias.  Skin: Negative for color change, pallor, rash and wound.  Neurological: Negative for seizures and headaches.  Psychiatric/Behavioral: Negative for confusion and suicidal ideas.    Objective:    BP (!) 142/93   Pulse 84   Ht 5' 7.5" (1.715 m)   Wt (!) 346 lb (156.9 kg)   BMI 53.39 kg/m   Wt Readings from Last 3 Encounters:  07/18/17 (!) 346 lb (156.9 kg)  04/17/17 (!) 353 lb (160.1 kg)  01/14/17 (!) 337 lb (152.9 kg)    Physical Exam  Constitutional: She is oriented to person, place, and time. She appears well-developed.  She has poor hygiene generally.  HENT:  Head: Normocephalic and atraumatic.  She has poor dentition.  Eyes: EOM are normal.  Neck: Normal range of motion. Neck supple. No tracheal deviation present. No thyromegaly present.  Cardiovascular: Normal rate and regular rhythm.  Pulmonary/Chest: Effort normal and breath sounds normal.  Abdominal: Soft. Bowel sounds are normal. There is no tenderness. There is no guarding.  Musculoskeletal: Normal range of motion. She exhibits no edema.  Neurological: She is alert and oriented to person, place, and time. She has normal reflexes. No cranial nerve deficit. Coordination normal.  Skin: Skin is warm and dry. No rash noted. No erythema. No pallor.  Psychiatric: She has a normal mood  and affect. Judgment normal.  Reluctant affect .    Results for orders placed or performed in visit on 04/17/17  COMPLETE METABOLIC PANEL WITH GFR  Result Value Ref Range   Glucose, Bld 149 (H) 65 - 139 mg/dL   BUN 14 7 - 25 mg/dL   Creat 0.94 0.50 - 1.05 mg/dL   GFR, Est Non African American 71 > OR = 60 mL/min/1.68m   GFR, Est African American 82 > OR = 60 mL/min/1.785m  BUN/Creatinine Ratio NOT APPLICABLE 6 - 22 (calc)   Sodium 139 135 - 146 mmol/L   Potassium 4.4 3.5 - 5.3 mmol/L   Chloride 104 98 - 110 mmol/L   CO2 27 20 - 32 mmol/L   Calcium 8.8 8.6 - 10.4  mg/dL   Total Protein 6.5 6.1 - 8.1 g/dL   Albumin 3.2 (L) 3.6 - 5.1 g/dL   Globulin 3.3 1.9 - 3.7 g/dL (calc)   AG Ratio 1.0 1.0 - 2.5 (calc)   Total Bilirubin 0.7 0.2 - 1.2 mg/dL   Alkaline phosphatase (APISO) 97 33 - 130 U/L   AST 11 10 - 35 U/L   ALT 12 6 - 29 U/L  Hemoglobin A1c  Result Value Ref Range   Hgb A1c MFr Bld 9.2 (H) <5.7 % of total Hgb   Mean Plasma Glucose 217 (calc)   eAG (mmol/L) 12.0 (calc)   Diabetic Labs (most recent): Lab Results  Component Value Date   HGBA1C 9.2 (H) 07/14/2017   HGBA1C 10.2 (H) 04/09/2017   HGBA1C 9.8 (H) 01/07/2017    Lipid Panel     Component Value Date/Time   CHOL 166 04/09/2017 1159   TRIG 131 04/09/2017 1159   HDL 37 (L) 04/09/2017 1159   CHOLHDL 4.5 04/09/2017 1159   VLDL 42 (H) 04/25/2015 1001   LDLCALC 200 (H) 04/25/2015 1001     Assessment & Plan:   1. Uncontrolled type 2 diabetes mellitus with complication, with long-term current use of insulin [place]  Her diabetes is complicated by neuropathy and possible retinopathy. - Patient has currently uncontrolled symptomatic type 2 DM of approximately since age [age]ears duration. - She came with slight improvement in her A1c to 9.2% from 10.2%. - She filled up her logs with blood glucose readings not very 500 with her meter.  -Patient remains at a high risk for more acute and chronic complications of diabetes which include CAD, CVA, CKD, retinopathy, and neuropathy. These are all discussed in detail with the patient.  - I have counseled the patient on diet management and weight loss, by adopting a carbohydrate restricted/protein rich diet.  - Patient is advised to stick to a routine mealtimes to eat 3 meals  a day and avoid unnecessary snacks ( to snack only to correct hypoglycemia).   -  Suggestion is made for her to avoid simple carbohydrates  from her diet including Cakes, Sweet Desserts / Pastries, Ice Cream, Soda (diet and regular), Sweet Tea, Candies, Chips,  Cookies, Store Bought Juices, Alcohol in Excess of  1-2 drinks a day, Artificial Sweeteners, and "Sugar-free" Products. This will help patient to have stable blood glucose profile and potentially avoid unintended weight gain.   - I encouraged the patient to switch to  unprocessed or minimally processed complex starch and increased protein intake (animal or plant source), fruits, and vegetables.   - I have approached patient with the following individualized plan to manage diabetes and patient agrees:   - She has  a documented blood glucose readings  on her logs which are  not in her meter. - Average blood glucose is 249 for the last 7 days, only 9 readings.  - This makes it difficult to adjust her treatment safely. I advised her to continue  Tresiba   80 units daily at bedtime,  increase NovoLog slightly to 18  units 3 times a day before meals for pre-meal blood glucose above 90 mg/dL, associated with strict monitoring of blood glucose 4 times a day-before meals and at bedtime.  - She is warned not to take insulin without monitoring blood glucose properly. -She is given individualized chart to correct hypoglycemia but 150 mg/dL. - She may need higher dose of insulin once her commitment for proper monitoring her blood glucose is assured.  -Patient is encouraged to call clinic for blood glucose levels less than 70 or above 300 mg /dl. - she is adamant saying that she does not tolerate MTF, Invokana, and [place].  - Patient specific target  A1c;  LDL, HDL, Triglycerides, and  Waist Circumference were discussed in detail.  2) BP/HTN:   her blood pressure is not controlled to target.  She is advised on salt  restriction,   her lisinopril is filled at 10 minute grams by mouth daily.  3) Lipids/HPL: LDL  Levels at 200, I advised her to continue Crestor 10 mg by mouth daily at bedtime.  4)  Weight/Diet: she keeps missing her appointment with  CDE exercise, and carbohydrates information provided. She    5) hypothyroidism: -Responding to treatment. I advised her to continue levothyroxine 100 g by mouth daily.  - We discussed about correct intake of levothyroxine, at fasting, with water, separated by at least 30 minutes from breakfast, and separated by more than 4 hours from calcium, iron, multivitamins, acid reflux medications (PPIs). -Patient is made aware of the fact that thyroid hormone replacement is needed for life, dose to be adjusted by periodic monitoring of thyroid function tests.  6) Chronic Care/Health Maintenance:  -Patient isinitiated on  Statin medications. She is encouraged to continue to follow up with Ophthalmology, Podiatrist at least yearly or according to recommendations, and advised to stay away from smoking. I have recommended yearly flu vaccine and pneumonia vaccination at least every 5 years; moderate intensity exercise for up to 150 minutes weekly; and  sleep for at least 7 hours a day.  - Time spent with the patient: 25 min, of which >50% was spent in reviewing her blood glucose logs , discussing her hypo- and hyper-glycemic episodes, reviewing her current and  previous labs and insulin doses and developing a plan to avoid hypo- and hyper-glycemia. Please refer to Patient Instructions for Blood Glucose Monitoring and Insulin/Medications Dosing Guide"  in media tab for additional information.  I advised patient to maintain close follow up with their PCP for primary care needs. Follow up plan: Return in about 3 months (around 10/16/2017) for meter, and logs.  Glade Lloyd, MD Phone: [phone]  Fax: [phone]  -  This note was partially dictated with voice recognition software. Similar sounding words can be transcribed inadequately or may not  be corrected upon review.  07/18/2017, 1:34 PM

## 2017-07-18 NOTE — Patient Instructions (Signed)

## 2017-08-05 ENCOUNTER — Telehealth: Payer: Self-pay | Admitting: "Endocrinology

## 2017-08-05 NOTE — Telephone Encounter (Signed)
Michelle Skinner is asking for a refill on pantoprazole (PROTONIX) 40 MG tablet sent to [facility]

## 2017-08-06 MED ORDER — PANTOPRAZOLE SODIUM 40 MG PO TBEC: 40.0000 mg | DELAYED_RELEASE_TABLET | Freq: Every day | ORAL | 1 refills | Status: DC

## 2017-09-05 ENCOUNTER — Other Ambulatory Visit: Payer: Self-pay | Admitting: "Endocrinology

## 2017-10-09 LAB — COMPLETE METABOLIC PANEL WITH GFR
AG Ratio: 1 (calc) (ref 1.0–2.5)
ALBUMIN MSPROF: 3.3 g/dL — AB (ref 3.6–5.1)
ALT: 18 U/L (ref 6–29)
AST: 16 U/L (ref 10–35)
Alkaline phosphatase (APISO): 104 U/L (ref 33–130)
BUN / CREAT RATIO: 13 (calc) (ref 6–22)
BUN: 14 mg/dL (ref 7–25)
CO2: 26 mmol/L (ref 20–32)
CREATININE: 1.09 mg/dL — AB (ref 0.50–1.05)
Calcium: 8.5 mg/dL — ABNORMAL LOW (ref 8.6–10.4)
Chloride: 105 mmol/L (ref 98–110)
GFR, EST AFRICAN AMERICAN: 69 mL/min/{1.73_m2} (ref >=60)
GFR, Est Non African American: 59 mL/min/{1.73_m2} — ABNORMAL LOW (ref >=60)
GLUCOSE: 331 mg/dL — AB (ref 65–139)
Globulin: 3.2 g/dL (calc) (ref 1.9–3.7)
Potassium: 4.2 mmol/L (ref 3.5–5.3)
Sodium: 137 mmol/L (ref 135–146)
TOTAL PROTEIN: 6.5 g/dL (ref 6.1–8.1)
Total Bilirubin: 0.4 mg/dL (ref 0.2–1.2)

## 2017-10-09 LAB — HEMOGLOBIN A1C
EAG (MMOL/L): 12.2 (calc)
HEMOGLOBIN A1C: 9.3 %{Hb} — AB (ref <5.7)
Mean Plasma Glucose: 220 (calc)

## 2017-10-09 LAB — MICROALBUMIN / CREATININE URINE RATIO
Creatinine, Urine: 152 mg/dL (ref 20–275)
Microalb Creat Ratio: 13 mcg/mg creat (ref <30)
Microalb, Ur: 2 mg/dL

## 2017-10-09 LAB — T4, FREE: FREE T4: 1.3 ng/dL (ref 0.8–1.8)

## 2017-10-09 LAB — TSH: TSH: 7.13 mIU/L — ABNORMAL HIGH

## 2017-10-09 LAB — VITAMIN D 25 HYDROXY (VIT D DEFICIENCY, FRACTURES): Vit D, 25-Hydroxy: 18 ng/mL — ABNORMAL LOW (ref 30–100)

## 2017-10-16 ENCOUNTER — Encounter: Payer: Self-pay | Admitting: "Endocrinology

## 2017-10-16 ENCOUNTER — Ambulatory Visit (INDEPENDENT_AMBULATORY_CARE_PROVIDER_SITE_OTHER): Payer: Medicare Other | Admitting: "Endocrinology

## 2017-10-16 VITALS — BP 146/78 | HR 74 | Ht 67.5 in | Wt 358.0 lb

## 2017-10-16 DIAGNOSIS — E039 Hypothyroidism, unspecified: Secondary | ICD-10-CM | POA: Diagnosis not present

## 2017-10-16 DIAGNOSIS — E118 Type 2 diabetes mellitus with unspecified complications: Secondary | ICD-10-CM | POA: Diagnosis not present

## 2017-10-16 DIAGNOSIS — Z794 Long term (current) use of insulin: Secondary | ICD-10-CM | POA: Diagnosis not present

## 2017-10-16 DIAGNOSIS — E1165 Type 2 diabetes mellitus with hyperglycemia: Secondary | ICD-10-CM | POA: Diagnosis not present

## 2017-10-16 DIAGNOSIS — E782 Mixed hyperlipidemia: Secondary | ICD-10-CM | POA: Diagnosis not present

## 2017-10-16 DIAGNOSIS — IMO0002 Reserved for concepts with insufficient information to code with codable children: Secondary | ICD-10-CM

## 2017-10-16 MED ORDER — FREESTYLE LIBRE 14 DAY SENSOR MISC: 1.0000 | 2 refills | Status: DC

## 2017-10-16 MED ORDER — LISINOPRIL 10 MG PO TABS: 10.0000 mg | ORAL_TABLET | Freq: Every day | ORAL | 0 refills | Status: DC

## 2017-10-16 MED ORDER — LEVOTHYROXINE SODIUM 125 MCG PO TABS: 125.0000 ug | ORAL_TABLET | Freq: Every day | ORAL | 3 refills | Status: DC

## 2017-10-16 MED ORDER — FREESTYLE LIBRE 14 DAY READER DEVI: 1.0000 | Freq: Once | 0 refills | Status: AC

## 2017-10-16 NOTE — Patient Instructions (Signed)

## 2017-10-16 NOTE — Progress Notes (Signed)
Subjective:    Patient ID: Michelle Skinner, female    DOB: 1965/10/28,    Past Medical History:  Diagnosis Date  . Cervical cancer ([place])   . Depression   . Diabetes mellitus, type II ([place])   . GERD (gastroesophageal reflux disease)   . Hyperlipidemia    Past Surgical History:  Procedure Laterality Date  . ABDOMINAL HYSTERECTOMY    . OTHER SURGICAL HISTORY     R Foot   Social History   Socioeconomic History  . Marital status: Married    Spouse name: Not on file  . Number of children: Not on file  . Years of education: Not on file  . Highest education level: Not on file  Occupational History  . Not on file  Social Needs  . Financial resource strain: Not on file  . Food insecurity:    Worry: Not on file    Inability: Not on file  . Transportation needs:    Medical: Not on file    Non-medical: Not on file  Tobacco Use  . Smoking status: Never Smoker  . Smokeless tobacco: Never Used  Substance and Sexual Activity  . Alcohol use: No    Alcohol/week: 0.0 oz  . Drug use: No  . Sexual activity: Not on file  Lifestyle  . Physical activity:    Days per week: Not on file    Minutes per session: Not on file  . Stress: Not on file  Relationships  . Social connections:    Talks on phone: Not on file    Gets together: Not on file    Attends religious service: Not on file    Active member of club or organization: Not on file    Attends meetings of clubs or organizations: Not on file    Relationship status: Not on file  Other Topics Concern  . Not on file  Social History Narrative  . Not on file   Outpatient Encounter Medications as of 10/16/2017  Medication Sig  . insulin aspart (NOVOLOG FLEXPEN) 100 UNIT/ML FlexPen Inject 20-26 Units into the skin 3 (three) times daily with meals.  Marland Kitchen atorvastatin (LIPITOR) 20 MG tablet TAKE 1 TABLET DAILY AT 6 PM  . Blood Glucose Monitoring Suppl (ONE TOUCH ULTRA 2) w/Device KIT Use to test blood glucose  . Continuous Blood  Gluc Receiver (FREESTYLE LIBRE 14 DAY READER) DEVI 1 each by Does not apply route once for 1 dose.  . Continuous Blood Gluc Sensor (FREESTYLE LIBRE 14 DAY SENSOR) MISC Inject 1 each into the skin every 14 (fourteen) days. Use as directed.  Marland Kitchen glucose blood (ONE TOUCH ULTRA TEST) test strip USE TO TEST BLOOD GLUCOSE 4 x daily E11.65  . LANCETS ULTRA THIN MISC Use as directed qid. E11.65  . levothyroxine (SYNTHROID, LEVOTHROID) 125 MCG tablet Take 1 tablet (125 mcg total) by mouth daily.  Marland Kitchen lisinopril (PRINIVIL,ZESTRIL) 10 MG tablet Take 1 tablet (10 mg total) by mouth daily.  . pantoprazole (PROTONIX) 40 MG tablet Take 1 tablet (40 mg total) by mouth daily.  . pregabalin (LYRICA) 75 MG capsule Take 75 mg by mouth 2 (two) times daily.  Marland Kitchen sulfamethoxazole-trimethoprim (BACTRIM DS,SEPTRA DS) 800-160 MG tablet Take 1 tablet by mouth 2 (two) times daily.  . TRESIBA FLEXTOUCH 200 UNIT/ML SOPN INJECT 80 UNITS  SUBCUTANEOUSLY DAILY  . [DISCONTINUED] insulin lispro (HUMALOG KWIKPEN) 100 UNIT/ML KiwkPen INJECT SUBCUTANEOUSLY 18 TO 24 UNITS TOTAL 3 TIMES  DAILY BEFORE MEALS  . [DISCONTINUED]  levothyroxine (SYNTHROID, LEVOTHROID) 100 MCG tablet Take 1 tablet (100 mcg total) by mouth daily.  . [DISCONTINUED] lisinopril (PRINIVIL,ZESTRIL) 10 MG tablet TAKE 1 TABLET BY MOUTH  DAILY   No facility-administered encounter medications on file as of 10/16/2017.    ALLERGIES: Allergies  Allergen Reactions  . Bactrim [Sulfamethoxazole-Trimethoprim]   . Gabapentin   . Invokana [Canagliflozin]    VACCINATION STATUS:  There is no immunization history on file for this patient.  Diabetes  She presents for her follow-up diabetic visit. She has type 2 diabetes mellitus. Onset time: She was diagnosed at approximate age of 14 years. Her disease course has been improving. There are no hypoglycemic associated symptoms. Pertinent negatives for hypoglycemia include no confusion, headaches, pallor or seizures. Associated  symptoms include fatigue, polydipsia and polyuria. Pertinent negatives for diabetes include no chest pain and no polyphagia. There are no hypoglycemic complications. Symptoms are improving. Diabetic complications include peripheral neuropathy and retinopathy. Pertinent negatives for diabetic complications include no autonomic neuropathy. Risk factors for coronary artery disease include diabetes mellitus, dyslipidemia, hypertension, obesity and sedentary lifestyle. She is compliant with treatment most of the time. Her weight is decreasing steadily. She is following a generally unhealthy diet. She has not had a previous visit with a dietitian (She missed her appointment.). She never participates in exercise. Her home blood glucose trend is increasing steadily. Her breakfast blood glucose range is generally >200 mg/dl. Her lunch blood glucose range is generally >200 mg/dl. Her dinner blood glucose range is generally >200 mg/dl. Her bedtime blood glucose range is generally >200 mg/dl. Her overall blood glucose range is >200 mg/dl. (She is not compliant for monitoring. She has 9 readings in the last 7 days averaging 249, however she feels Her logs blood glucose readings as if she is monitoring 4 times a day.) An ACE inhibitor/angiotensin II receptor blocker is not being taken. Eye exam is current.  Hyperlipidemia  This is a chronic problem. The current episode started more than 1 year ago. The problem is uncontrolled. Recent lipid tests were reviewed and are high. Exacerbating diseases include diabetes, hypothyroidism and obesity. Pertinent negatives include no chest pain, myalgias or shortness of breath. She is currently on no antihyperlipidemic treatment.  Thyroid Problem  Presents for initial visit. Symptoms include fatigue. Patient reports no cold intolerance, diarrhea, heat intolerance or palpitations. The following procedures have not been performed: radioiodine uptake scan and thyroidectomy. Her past medical  history is significant for diabetes and hyperlipidemia.     Review of Systems  Constitutional: Positive for fatigue. Negative for unexpected weight change.  HENT: Negative for trouble swallowing and voice change.   Eyes: Negative for visual disturbance.  Respiratory: Negative for cough, shortness of breath and wheezing.   Cardiovascular: Negative for chest pain, palpitations and leg swelling.  Gastrointestinal: Negative for diarrhea, nausea and vomiting.  Endocrine: Positive for polydipsia and polyuria. Negative for cold intolerance, heat intolerance and polyphagia.  Musculoskeletal: Negative for arthralgias and myalgias.  Skin: Negative for color change, pallor, rash and wound.  Neurological: Negative for seizures and headaches.  Psychiatric/Behavioral: Negative for confusion and suicidal ideas.    Objective:    BP (!) 146/78   Pulse 74   Ht 5' 7.5" (1.715 m)   Wt (!) 358 lb (162.4 kg)   BMI 55.24 kg/m   Wt Readings from Last 3 Encounters:  10/16/17 (!) 358 lb (162.4 kg)  07/18/17 (!) 346 lb (156.9 kg)  04/17/17 (!) 353 lb (160.1 kg)    Physical  Exam  Constitutional: She is oriented to person, place, and time. She appears well-developed.  She has poor hygiene generally.  HENT:  Head: Normocephalic and atraumatic.  She has poor dentition.  Eyes: EOM are normal.  Neck: Normal range of motion. Neck supple. No tracheal deviation present. No thyromegaly present.  Cardiovascular: Normal rate.  Pulmonary/Chest: Effort normal.  Abdominal: There is no tenderness. There is no guarding.  Musculoskeletal: Normal range of motion. She exhibits no edema.  Neurological: She is alert and oriented to person, place, and time. She has normal reflexes. No cranial nerve deficit. Coordination normal.  Skin: Skin is warm and dry. No rash noted. No erythema. No pallor.  Psychiatric: She has a normal mood and affect. Judgment normal.  Reluctant , passive affect .    Results for orders placed  or performed in visit on 07/18/17  COMPLETE METABOLIC PANEL WITH GFR  Result Value Ref Range   Glucose, Bld 331 (H) 65 - 139 mg/dL   BUN 14 7 - 25 mg/dL   Creat 1.09 (H) 0.50 - 1.05 mg/dL   GFR, Est Non African American 59 (L) > OR = 60 mL/min/1.33m   GFR, Est African American 69 > OR = 60 mL/min/1.736m  BUN/Creatinine Ratio 13 6 - 22 (calc)   Sodium 137 135 - 146 mmol/L   Potassium 4.2 3.5 - 5.3 mmol/L   Chloride 105 98 - 110 mmol/L   CO2 26 20 - 32 mmol/L   Calcium 8.5 (L) 8.6 - 10.4 mg/dL   Total Protein 6.5 6.1 - 8.1 g/dL   Albumin 3.3 (L) 3.6 - 5.1 g/dL   Globulin 3.2 1.9 - 3.7 g/dL (calc)   AG Ratio 1.0 1.0 - 2.5 (calc)   Total Bilirubin 0.4 0.2 - 1.2 mg/dL   Alkaline phosphatase (APISO) 104 33 - 130 U/L   AST 16 10 - 35 U/L   ALT 18 6 - 29 U/L  Hemoglobin A1c  Result Value Ref Range   Hgb A1c MFr Bld 9.3 (H) <5.7 % of total Hgb   Mean Plasma Glucose 220 (calc)   eAG (mmol/L) 12.2 (calc)  T4, free  Result Value Ref Range   Free T4 1.3 0.8 - 1.8 ng/dL  TSH  Result Value Ref Range   TSH 7.13 (H) mIU/L  VITAMIN D 25 Hydroxy (Vit-D Deficiency, Fractures)  Result Value Ref Range   Vit D, 25-Hydroxy 18 (L) 30 - 100 ng/mL  Microalbumin / creatinine urine ratio  Result Value Ref Range   Creatinine, Urine 152 20 - 275 mg/dL   Microalb, Ur 2.0 mg/dL   Microalb Creat Ratio 13 <30 mcg/mg creat   Diabetic Labs (most recent): Lab Results  Component Value Date   HGBA1C 9.3 (H) 10/08/2017   HGBA1C 9.2 (H) 07/14/2017   HGBA1C 10.2 (H) 04/09/2017    Lipid Panel     Component Value Date/Time   CHOL 166 04/09/2017 1159   TRIG 131 04/09/2017 1159   HDL 37 (L) 04/09/2017 1159   CHOLHDL 4.5 04/09/2017 1159   VLDL 42 (H) 04/25/2015 1001   LDLCALC 106 (H) 04/09/2017 1159     Assessment & Plan:   1. Uncontrolled type 2 diabetes mellitus with complication, with long-term current use of insulin ([facility] [place]  Her diabetes is complicated by neuropathy and possible  retinopathy. - Patient has currently uncontrolled symptomatic type 2 DM of approximately since age 89ears duration. - She came with a meter showing an average of 2 readings  per day, however she documents as if she is monitoring 4 times a day.  Her recent A1c remains high at 9.3%.  No documented nor reported hypoglycemia.   -Patient remains at a high risk for more acute and chronic complications of diabetes which include CAD, CVA, CKD, retinopathy, and neuropathy. These are all discussed in detail with the patient.  - I have counseled the patient on diet management and weight loss, by adopting a carbohydrate restricted/protein rich diet.  - Patient is advised to stick to a routine mealtimes to eat 3 meals  a day and avoid unnecessary snacks ( to snack only to correct hypoglycemia).   -  Suggestion is made for her to avoid simple carbohydrates  from her diet including Cakes, Sweet Desserts / Pastries, Ice Cream, Soda (diet and regular), Sweet Tea, Candies, Chips, Cookies, Store Bought Juices, Alcohol in Excess of  1-2 drinks a day, Artificial Sweeteners, and "Sugar-free" Products. This will help patient to have stable blood glucose profile and potentially avoid unintended weight gain.   - I encouraged the patient to switch to  unprocessed or minimally processed complex starch and increased protein intake (animal or plant source), fruits, and vegetables.   - I have approached patient with the following individualized plan to manage diabetes and patient agrees:   -Patient with compliance to her medications.   -She has a documented blood glucose readings  on her logs which are  not in her meter. - Average blood glucose is 245 for the last 30 days of 59 readings.  - This makes it difficult to adjust her treatment safely. -For now, I advised her to continue Tresiba 80 units nightly, increase NovoLog to 20 units 3 times a day before meals for pre-meal blood glucose above 90 mg/dL, associated with  strict monitoring of blood glucose 4 times a day-before meals and at bedtime.  - She is warned not to take insulin without monitoring blood glucose properly. -She is given individualized chart to correct hypoglycemia but 150 mg/dL. - She may need higher dose of insulin once her commitment for proper monitoring her blood glucose is assured. -She will benefit from continuous glucose monitoring.  I discussed and prescribed the freestyle Libre device for her.  -Patient is encouraged to call clinic for blood glucose levels less than 70 or above 300 mg /dl. - she is adamant saying that she does not tolerate MTF, Invokana, and [place].  - Patient specific target  A1c;  LDL, HDL, Triglycerides, and  Waist Circumference were discussed in detail.  2) BP/HTN: Her blood pressure is not controlled to target.  She is advised on salt  restriction,   her lisinopril is filled at 10 minute grams by mouth daily.  3) Lipids/HPL: LDL  Levels at 200.  She is on Crestor 10 mg p.o. nightly, however with inconsistent intake of this medication.   4)  Weight/Diet: she keeps missing her appointment with  CDE exercise, and carbohydrates information provided. She   5) hypothyroidism: -Could be because of compliance/nonadherence, her thyroid function tests are consistent with inadequate replacement.  I discussed and increased her levothyroxine slightly to 112 mcg p.o. every morning.     - We discussed about correct intake of levothyroxine, at fasting, with water, separated by at least 30 minutes from breakfast, and separated by more than 4 hours from calcium, iron, multivitamins, acid reflux medications (PPIs). -Patient is made aware of the fact that thyroid hormone replacement is needed for life, dose to be adjusted  by periodic monitoring of thyroid function tests.  6) Chronic Care/Health Maintenance:  -Patient isinitiated on  Statin medications. She is encouraged to continue to follow up with Ophthalmology, Podiatrist  at least yearly or according to recommendations, and advised to stay away from smoking. I have recommended yearly flu vaccine and pneumonia vaccination at least every 5 years; moderate intensity exercise for up to 150 minutes weekly; and  sleep for at least 7 hours a day.  I advised patient to maintain close follow up with their PCP for primary care needs.  - Time spent with the patient: 25 min, of which >50% was spent in reviewing her blood glucose logs , discussing her hypo- and hyper-glycemic episodes, reviewing her current and  previous labs and insulin doses and developing a plan to avoid hypo- and hyper-glycemia. Please refer to Patient Instructions for Blood Glucose Monitoring and Insulin/Medications Dosing Guide"  in media tab for additional information. Michelle Skinner participated in the discussions, expressed understanding, and voiced agreement with the above plans.  she is encouraged to contact clinic should she have any questions or concerns prior to her return visit.  Follow up plan: Return in about 3 months (around 01/15/2018) for follow up with pre-visit labs, meter, and logs.  Glade Lloyd, MD Phone: [phone]  Fax: [phone]  -  This note was partially dictated with voice recognition software. Similar sounding words can be transcribed inadequately or may not  be corrected upon review.  10/16/2017, 3:17 PM

## 2017-10-20 ENCOUNTER — Ambulatory Visit (INDEPENDENT_AMBULATORY_CARE_PROVIDER_SITE_OTHER): Payer: Medicare Other | Admitting: Gastroenterology

## 2017-10-20 ENCOUNTER — Encounter: Payer: Self-pay | Admitting: Gastroenterology

## 2017-10-20 VITALS — BP 154/67 | HR 80 | Temp 97.3°F | Ht 67.5 in | Wt 365.2 lb

## 2017-10-20 DIAGNOSIS — R935 Abnormal findings on diagnostic imaging of other abdominal regions, including retroperitoneum: Secondary | ICD-10-CM | POA: Insufficient documentation

## 2017-10-20 DIAGNOSIS — K838 Other specified diseases of biliary tract: Secondary | ICD-10-CM

## 2017-10-20 MED ORDER — DIAZEPAM 10 MG PO TABS: 10.0000 mg | ORAL_TABLET | Freq: Once | ORAL | 0 refills | Status: AC

## 2017-10-20 NOTE — Progress Notes (Signed)
Of note, patient is taking Bactrim for foot infection, the prescribing provider is aware of her allergy (itching) to them medication. Currently she seems to be tolerating without rash etc.

## 2017-10-20 NOTE — Progress Notes (Signed)
CC'D TO PCP °

## 2017-10-20 NOTE — Assessment & Plan Note (Signed)
Very pleasant 52 year old female presenting for abnormal CT scan.  CT done recently for acute onset nausea/vomiting and abdominal pain during ED evaluation 2 weeks ago.  There was a 7 mm gas density structure seen within the ampulla, possibly cholesterol calculus or duodenal diverticulum.  Common bile duct was mildly dilated at 8 mm.  Her alkaline phosphatase is minimally elevated but otherwise LFTs were unremarkable.  Her vomiting abdominal pain resolved.  At this time would advise MRCP to further evaluate findings.  Will send patient to large for MRI at [facility] due to her size.  She does have some claustrophobia but has tolerated MRIs in the past.  I have provided her with Valium 10 mg to take 1 hour before her scheduled time.  Further recommendations to follow pending results.  We have requested records of prior endoscopy for review.  In addition at some point this year would recommend first ever colonoscopy.

## 2017-10-20 NOTE — Patient Instructions (Signed)
1. MRI as scheduled. Take Valium one hour before your MRI.

## 2017-10-20 NOTE — Patient Instructions (Signed)
No PA needed for MRI/MRCP per [employer] website. 

## 2017-10-20 NOTE — Progress Notes (Signed)
Primary Care Physician:  Glenda Chroman, MD  Primary Gastroenterologist:   Garfield Cornea, MD    Chief Complaint  Patient presents with  . abnormal CT    HPI:  Michelle Skinner is a 52 y.o. female here at the request of Dr. Woody Seller for further evaluation of abnormal CT scan.  Approximately 2 weeks ago patient developed acute onset nausea and vomiting followed by diffuse abdominal pain.  She was seen in the emergency department at [facility].  She had a CT abdomen pelvis with contrast that showed a 7 mm gas density structure within the ampulla.  No significant pancreatic duct dilation.  Extrahepatic common bile duct mildly dilated at 8 mm.  It was felt this could represent a cholesterol calculus or duodenal diverticulum.  She was sent here for further investigation.  While evaluated in the ED her lipase was normal, alkaline phosphatase minimally elevated at 121, total bilirubin, AST, ALT all normal.  At first they were trying to get patient transferred for possible ERCP but [facility] did not have a bed and transfer was delayed at [facility] to make sure the patient did not have DKA's as I cannot accommodate her on the floor the bed was available if she did.  Ultimately the patient improved while in the ED and was actually discharged home.  She saw Dr. Britta Mccreedy a few years back, she reports an EGD was performed.  She was given a diagnosis of gastroparesis and she was treated for H. pylori gastritis.  We are requesting records.  Typically on a day-to-day basis she really does not have vomiting, occasional nausea.  Her heartburn is reasonably well controlled on pantoprazole.  Previously had been on omeprazole with pretty good control but insurance would not pay for it anymore.  She does take Tums at times.  She denies dysphagia.  She has alternating constipation and diarrhea about 50% of the time she has either 1 of the other.  She does not take any medication.  If she gets constipated she eats dairy and then she develops  diarrhea.  No melena or rectal bleeding.  No prior colonoscopy.  She denies any abdominal pain.  No nausea and vomiting in the past 2 weeks.  Her diabetes is poorly controlled.  Last hemoglobin A1c over 9 approximately a week ago.   Current Outpatient Medications  Medication Sig Dispense Refill  . atorvastatin (LIPITOR) 20 MG tablet TAKE 1 TABLET DAILY AT 6 PM 90 tablet 0  . Blood Glucose Monitoring Suppl (ONE TOUCH ULTRA 2) w/Device KIT Use to test blood glucose 1 each 0  . Continuous Blood Gluc Sensor (FREESTYLE LIBRE 14 DAY SENSOR) MISC Inject 1 each into the skin every 14 (fourteen) days. Use as directed. 2 each 2  . glucose blood (ONE TOUCH ULTRA TEST) test strip USE TO TEST BLOOD GLUCOSE 4 x daily E11.65 400 each 2  . insulin aspart (NOVOLOG FLEXPEN) 100 UNIT/ML FlexPen Inject 18 Units into the skin 3 (three) times daily with meals.     Marland Kitchen LANCETS ULTRA THIN MISC Use as directed qid. E11.65 400 each 2  . levothyroxine (SYNTHROID, LEVOTHROID) 125 MCG tablet Take 1 tablet (125 mcg total) by mouth daily. 30 tablet 3  . lisinopril (PRINIVIL,ZESTRIL) 10 MG tablet Take 1 tablet (10 mg total) by mouth daily. 90 tablet 0  . pantoprazole (PROTONIX) 40 MG tablet Take 1 tablet (40 mg total) by mouth daily. 90 tablet 1  . pregabalin (LYRICA) 75 MG capsule Take 75 mg by  mouth as needed.     . sulfamethoxazole-trimethoprim (BACTRIM DS,SEPTRA DS) 800-160 MG tablet Take 1 tablet by mouth 2 (two) times daily.  1  . TRESIBA FLEXTOUCH 200 UNIT/ML SOPN INJECT 80 UNITS  SUBCUTANEOUSLY DAILY 27 mL 0   No current facility-administered medications for this visit.     Allergies as of 10/20/2017 - Review Complete 10/20/2017  Allergen Reaction Noted  . Bactrim [sulfamethoxazole-trimethoprim]  04/25/2015  . [place]  10/20/2017  . Gabapentin  04/25/2015  . Invokana [canagliflozin]  04/25/2015    Past Medical History:  Diagnosis Date  . Cervical cancer ([place])   . Depression   . Diabetes mellitus, type II  ([place])   . GERD (gastroesophageal reflux disease)   . Hyperlipidemia   . PTSD (post-traumatic stress disorder)     Past Surgical History:  Procedure Laterality Date  . ABDOMINAL HYSTERECTOMY    . OTHER SURGICAL HISTORY     R Foot  . UMBILICAL HERNIA REPAIR  2007    Family History  Problem Relation Age of Onset  . Hypertension Mother   . Diabetes Father   . Hyperlipidemia Father   . CAD Father   . Stroke Father   . Osteoporosis Maternal Grandmother   . Cancer Maternal Grandmother   . Hypertension Maternal Grandmother   . Colon cancer Neg Hx     Social History   Socioeconomic History  . Marital status: Married    Spouse name: Not on file  . Number of children: Not on file  . Years of education: Not on file  . Highest education level: Not on file  Occupational History  . Not on file  Social Needs  . Financial resource strain: Not on file  . Food insecurity:    Worry: Not on file    Inability: Not on file  . Transportation needs:    Medical: Not on file    Non-medical: Not on file  Tobacco Use  . Smoking status: Never Smoker  . Smokeless tobacco: Never Used  Substance and Sexual Activity  . Alcohol use: No    Alcohol/week: 0.0 oz  . Drug use: No  . Sexual activity: Not on file  Lifestyle  . Physical activity:    Days per week: Not on file    Minutes per session: Not on file  . Stress: Not on file  Relationships  . Social connections:    Talks on phone: Not on file    Gets together: Not on file    Attends religious service: Not on file    Active member of club or organization: Not on file    Attends meetings of clubs or organizations: Not on file    Relationship status: Not on file  . Intimate partner violence:    Fear of current or ex partner: Not on file    Emotionally abused: Not on file    Physically abused: Not on file    Forced sexual activity: Not on file  Other Topics Concern  . Not on file  Social History Narrative  . Not on file       ROS:  General: Negative for anorexia, weight loss, fever, chills, fatigue, weakness. Eyes: Negative for vision changes.  ENT: Negative for hoarseness, difficulty swallowing , nasal congestion. CV: Negative for chest pain, angina, palpitations, dyspnea on exertion, peripheral edema.  Respiratory: Negative for dyspnea at rest, dyspnea on exertion, cough, sputum, wheezing.  GI: See history of present illness. GU:  Negative for dysuria, hematuria, urinary  incontinence, urinary frequency, nocturnal urination.  MS: Positive joint pain. Derm: Negative for rash or itching.  She reports being treated for foot infection by Dr. Berline Lopes Neuro: Negative for weakness, abnormal sensation, seizure, frequent headaches, memory loss, confusion.  Psych: Negative for anxiety, depression, suicidal ideation, hallucinations.  Endo: Negative for unusual weight change.  Heme: Negative for bruising or bleeding. Allergy: Negative for rash or hives.    Physical Examination:  BP (!) 154/67   Pulse 80   Temp (!) 97.3 F (36.3 C) (Oral)   Ht 5' 7.5" (1.715 m)   Wt (!) 365 lb 3.2 oz (165.7 kg)   BMI 56.35 kg/m    General: Well-nourished, well-developed in no acute distress.  Head: Normocephalic, atraumatic.   Eyes: Conjunctiva pink, no icterus. Mouth: Oropharyngeal mucosa moist and pink , no lesions erythema or exudate. Neck: Supple without thyromegaly, masses, or lymphadenopathy.  Lungs: Clear to auscultation bilaterally.  Heart: Regular rate and rhythm, no murmurs rubs or gallops.  Abdomen: Bowel sounds are normal, mild diffuse tenderness, exam limited due to body habitus, no abdominal bruits or   hernia , no rebound or guarding.   Rectal: Not performed Extremities: No lower extremity edema. No clubbing or deformities.  Neuro: Alert and oriented x 4 , grossly normal neurologically.  Skin: Warm and dry, no rash or jaundice.   Psych: Alert and cooperative, normal mood and affect.  Labs: Labs dated  10/03/2017, total bilirubin 0.4, alkaline phosphatase 106, AST 15.3, ALT 16, albumin 3.4, amylase 18, lipase 24 white blood cell count 9000, hemoglobin 11.3 slightly low, hematocrit 37.5 normal, MCV 77.8 low, platelets 344,000   Lab Results  Component Value Date   HGBA1C 9.3 (H) 10/08/2017   Lab Results  Component Value Date   CREATININE 1.09 (H) 10/08/2017   BUN 14 10/08/2017   NA 137 10/08/2017   K 4.2 10/08/2017   CL 105 10/08/2017   CO2 26 10/08/2017   Lab Results  Component Value Date   ALT 18 10/08/2017   AST 16 10/08/2017   ALKPHOS 104 10/08/2017   BILITOT 0.4 10/08/2017    Lab Results  Component Value Date   TSH 7.13 (H) 10/08/2017     Imaging Studies: No results found.

## 2017-10-27 ENCOUNTER — Ambulatory Visit (HOSPITAL_COMMUNITY)
Admission: RE | Admit: 2017-10-27 | Discharge: 2017-10-27 | Disposition: A | Discharge status: Home / Self Care | Payer: Medicare Other | Source: Ambulatory Visit | Attending: Gastroenterology | Admitting: Gastroenterology

## 2017-10-27 ENCOUNTER — Other Ambulatory Visit: Payer: Self-pay | Admitting: Gastroenterology

## 2017-10-27 DIAGNOSIS — R935 Abnormal findings on diagnostic imaging of other abdominal regions, including retroperitoneum: Secondary | ICD-10-CM | POA: Insufficient documentation

## 2017-10-27 DIAGNOSIS — K449 Diaphragmatic hernia without obstruction or gangrene: Secondary | ICD-10-CM | POA: Insufficient documentation

## 2017-10-27 DIAGNOSIS — K5710 Diverticulosis of small intestine without perforation or abscess without bleeding: Secondary | ICD-10-CM | POA: Diagnosis not present

## 2017-10-27 DIAGNOSIS — K838 Other specified diseases of biliary tract: Secondary | ICD-10-CM

## 2017-10-27 MED ORDER — GADOBENATE DIMEGLUMINE 529 MG/ML IV SOLN
20.0000 mL | Freq: Once | INTRAVENOUS | Status: AC
  Administered 2017-10-27: 20 mL via INTRAVENOUS

## 2017-11-10 ENCOUNTER — Telehealth: Payer: Self-pay

## 2017-11-10 NOTE — Telephone Encounter (Signed)
Pt called office asking about MRI MRCP results from 10/27/17. She can be reached at [phone].

## 2017-11-10 NOTE — Telephone Encounter (Signed)
See result note.  

## 2017-11-11 NOTE — Telephone Encounter (Signed)
Lmom, waiting on a return call.  

## 2017-11-12 ENCOUNTER — Encounter: Payer: Self-pay | Admitting: Internal Medicine

## 2017-11-12 NOTE — Telephone Encounter (Signed)
Spoke with pt, pt was notified of results.

## 2017-11-12 NOTE — Progress Notes (Signed)
PATIENT SCHEDULED AND LETTER SENT  °

## 2017-12-04 ENCOUNTER — Other Ambulatory Visit: Payer: Self-pay | Admitting: "Endocrinology

## 2018-01-10 ENCOUNTER — Telehealth: Payer: Self-pay | Admitting: Gastroenterology

## 2018-01-10 NOTE — Telephone Encounter (Signed)
egd and path 12/2015: h.pylori gastritis.   Would recommend checking for h.pylori eradication with H.pylori stool antigen. Has to be off antibiotics and pantoprazole for two weeks before.

## 2018-01-12 NOTE — Telephone Encounter (Signed)
Lmom, waiting on a return call.  

## 2018-01-12 NOTE — Telephone Encounter (Signed)
If it was not a stool test or breath test, she will still testing to make sure H.pylori was adequately treated. Blood test will only so previous exposure and will likely remain positive lifelong.

## 2018-01-12 NOTE — Telephone Encounter (Signed)
Spoke with pt. Pt said she completed an H. Pyplori test with her PCP less than 6 months ago. Pt is going to have that doctor send the results to our office.

## 2018-01-13 NOTE — Telephone Encounter (Signed)
Noted. Spoke with pt and confirmed that it was a stool test that was taken. Pt said her PCP office said they will fax results to our office today.

## 2018-01-19 ENCOUNTER — Ambulatory Visit: Payer: Medicare Other | Admitting: "Endocrinology

## 2018-01-20 ENCOUNTER — Other Ambulatory Visit: Payer: Self-pay

## 2018-01-20 DIAGNOSIS — K2970 Gastritis, unspecified, without bleeding: Principal | ICD-10-CM

## 2018-01-20 DIAGNOSIS — B9681 Helicobacter pylori [H. pylori] as the cause of diseases classified elsewhere: Secondary | ICD-10-CM

## 2018-01-20 NOTE — Telephone Encounter (Signed)
Left a detailed message. Pt is to have h. Pylori stool testing. Orders were placed and released for [facility].

## 2018-01-20 NOTE — Telephone Encounter (Addendum)
Received records from PCP, stool hemoccult (negative) not H.pylori dated 11/2017.   I'm not sure if there is truly H.pylori stool antigen out there.   Recommend she complete H.pylori stool antigen, off antibiotics and pantoprazole for 2 weeks.

## 2018-02-02 ENCOUNTER — Other Ambulatory Visit: Payer: Self-pay | Admitting: "Endocrinology

## 2018-02-02 DIAGNOSIS — Z794 Long term (current) use of insulin: Principal | ICD-10-CM

## 2018-02-02 DIAGNOSIS — IMO0002 Reserved for concepts with insufficient information to code with codable children: Secondary | ICD-10-CM

## 2018-02-02 DIAGNOSIS — E1165 Type 2 diabetes mellitus with hyperglycemia: Secondary | ICD-10-CM

## 2018-02-02 DIAGNOSIS — E118 Type 2 diabetes mellitus with unspecified complications: Principal | ICD-10-CM

## 2018-02-03 LAB — COMPLETE METABOLIC PANEL WITH GFR
AG RATIO: 0.9 (calc) — AB (ref 1.0–2.5)
ALBUMIN MSPROF: 3.3 g/dL — AB (ref 3.6–5.1)
ALKALINE PHOSPHATASE (APISO): 90 U/L (ref 33–130)
ALT: 12 U/L (ref 6–29)
AST: 13 U/L (ref 10–35)
BUN: 17 mg/dL (ref 7–25)
CO2: 30 mmol/L (ref 20–32)
Calcium: 8.8 mg/dL (ref 8.6–10.4)
Chloride: 98 mmol/L (ref 98–110)
Creat: 0.94 mg/dL (ref 0.50–1.05)
GFR, Est African American: 82 mL/min/{1.73_m2} (ref >=60)
GFR, Est Non African American: 71 mL/min/{1.73_m2} (ref >=60)
GLOBULIN: 3.7 g/dL (ref 1.9–3.7)
Glucose, Bld: 325 mg/dL — ABNORMAL HIGH (ref 65–139)
POTASSIUM: 4.9 mmol/L (ref 3.5–5.3)
SODIUM: 134 mmol/L — AB (ref 135–146)
Total Bilirubin: 0.5 mg/dL (ref 0.2–1.2)
Total Protein: 7 g/dL (ref 6.1–8.1)

## 2018-02-03 LAB — HEMOGLOBIN A1C
Hgb A1c MFr Bld: 10.9 % of total Hgb — ABNORMAL HIGH (ref <5.7)
Mean Plasma Glucose: 266 (calc)
eAG (mmol/L): 14.7 (calc)

## 2018-02-09 ENCOUNTER — Ambulatory Visit (INDEPENDENT_AMBULATORY_CARE_PROVIDER_SITE_OTHER): Payer: Medicare Other | Admitting: "Endocrinology

## 2018-02-09 ENCOUNTER — Encounter: Payer: Self-pay | Admitting: "Endocrinology

## 2018-02-09 VITALS — BP 151/89 | HR 90 | Ht 67.5 in | Wt 353.0 lb

## 2018-02-09 DIAGNOSIS — E039 Hypothyroidism, unspecified: Secondary | ICD-10-CM

## 2018-02-09 DIAGNOSIS — E118 Type 2 diabetes mellitus with unspecified complications: Secondary | ICD-10-CM

## 2018-02-09 DIAGNOSIS — Z91199 Patient's noncompliance with other medical treatment and regimen due to unspecified reason: Secondary | ICD-10-CM

## 2018-02-09 DIAGNOSIS — I10 Essential (primary) hypertension: Secondary | ICD-10-CM

## 2018-02-09 DIAGNOSIS — IMO0002 Reserved for concepts with insufficient information to code with codable children: Secondary | ICD-10-CM

## 2018-02-09 DIAGNOSIS — Z9119 Patient's noncompliance with other medical treatment and regimen: Secondary | ICD-10-CM | POA: Insufficient documentation

## 2018-02-09 DIAGNOSIS — Z794 Long term (current) use of insulin: Secondary | ICD-10-CM

## 2018-02-09 DIAGNOSIS — E782 Mixed hyperlipidemia: Secondary | ICD-10-CM | POA: Diagnosis not present

## 2018-02-09 DIAGNOSIS — E1165 Type 2 diabetes mellitus with hyperglycemia: Secondary | ICD-10-CM

## 2018-02-09 MED ORDER — LEVOTHYROXINE SODIUM 125 MCG PO TABS: 125.0000 ug | ORAL_TABLET | Freq: Every day | ORAL | 0 refills | Status: DC

## 2018-02-09 MED ORDER — ATORVASTATIN CALCIUM 20 MG PO TABS: 20.0000 mg | ORAL_TABLET | Freq: Every day | ORAL | 0 refills | Status: DC

## 2018-02-09 MED ORDER — FREESTYLE LIBRE 14 DAY SENSOR MISC: 1.0000 | 2 refills | Status: DC

## 2018-02-09 MED ORDER — LISINOPRIL 10 MG PO TABS: 10.0000 mg | ORAL_TABLET | Freq: Every day | ORAL | 0 refills | Status: DC

## 2018-02-09 MED ORDER — FREESTYLE LIBRE 14 DAY READER DEVI: 1.0000 | 0 refills | Status: DC

## 2018-02-09 NOTE — Patient Instructions (Signed)

## 2018-02-09 NOTE — Progress Notes (Signed)
Endocrinology follow-up note  Subjective:    Patient ID: Michelle Skinner, female    DOB: 09-18-1965,    Past Medical History:  Diagnosis Date  . Cervical cancer ([place])   . Depression   . Diabetes mellitus, type II ([place])   . GERD (gastroesophageal reflux disease)   . Hyperlipidemia   . PTSD (post-traumatic stress disorder)    Past Surgical History:  Procedure Laterality Date  . ABDOMINAL HYSTERECTOMY    . OTHER SURGICAL HISTORY     R Foot  . UMBILICAL HERNIA REPAIR  2007   Social History   Socioeconomic History  . Marital status: Married    Spouse name: Not on file  . Number of children: Not on file  . Years of education: Not on file  . Highest education level: Not on file  Occupational History  . Not on file  Social Needs  . Financial resource strain: Not on file  . Food insecurity:    Worry: Not on file    Inability: Not on file  . Transportation needs:    Medical: Not on file    Non-medical: Not on file  Tobacco Use  . Smoking status: Never Smoker  . Smokeless tobacco: Never Used  Substance and Sexual Activity  . Alcohol use: No    Alcohol/week: 0.0 oz  . Drug use: No  . Sexual activity: Not on file  Lifestyle  . Physical activity:    Days per week: Not on file    Minutes per session: Not on file  . Stress: Not on file  Relationships  . Social connections:    Talks on phone: Not on file    Gets together: Not on file    Attends religious service: Not on file    Active member of club or organization: Not on file    Attends meetings of clubs or organizations: Not on file    Relationship status: Not on file  Other Topics Concern  . Not on file  Social History Narrative  . Not on file   Outpatient Encounter Medications as of 02/09/2018  Medication Sig  . Continuous Blood Gluc Receiver (FREESTYLE LIBRE 14 DAY READER) DEVI 1 each by Does not apply route every 14 (fourteen) days.  . [DISCONTINUED] Continuous Blood Gluc Receiver (FREESTYLE LIBRE 14 DAY  READER) DEVI by Does not apply route every 14 (fourteen) days.  Marland Kitchen atorvastatin (LIPITOR) 20 MG tablet TAKE 1 TABLET DAILY AT 6 PM  . Blood Glucose Monitoring Suppl (ONE TOUCH ULTRA 2) w/Device KIT Use to test blood glucose  . Continuous Blood Gluc Sensor (FREESTYLE LIBRE 14 DAY SENSOR) MISC Inject 1 each into the skin every 14 (fourteen) days. Use as directed.  Marland Kitchen glucose blood (ONE TOUCH ULTRA TEST) test strip USE TO TEST BLOOD GLUCOSE 4 x daily E11.65  . insulin aspart (NOVOLOG FLEXPEN) 100 UNIT/ML FlexPen Inject 18 Units into the skin 3 (three) times daily with meals.   Marland Kitchen LANCETS ULTRA THIN MISC Use as directed qid. E11.65  . levothyroxine (SYNTHROID, LEVOTHROID) 125 MCG tablet Take 1 tablet (125 mcg total) by mouth daily.  Marland Kitchen lisinopril (PRINIVIL,ZESTRIL) 10 MG tablet TAKE 1 TABLET BY MOUTH  DAILY  . pantoprazole (PROTONIX) 40 MG tablet Take 1 tablet (40 mg total) by mouth daily.  . pregabalin (LYRICA) 75 MG capsule Take 75 mg by mouth as needed.   . sulfamethoxazole-trimethoprim (BACTRIM DS,SEPTRA DS) 800-160 MG tablet Take 1 tablet by mouth 2 (two) times daily.  Michelle Skinner  FLEXTOUCH 200 UNIT/ML SOPN INJECT 80 UNITS  SUBCUTANEOUSLY DAILY  . [DISCONTINUED] Continuous Blood Gluc Sensor (FREESTYLE LIBRE 14 DAY SENSOR) MISC Inject 1 each into the skin every 14 (fourteen) days. Use as directed.   No facility-administered encounter medications on file as of 02/09/2018.    ALLERGIES: Allergies  Allergen Reactions  . Bactrim [Sulfamethoxazole-Trimethoprim]     Itch all over.   Michelle Skinner   . Gabapentin   . Invokana [Canagliflozin]    VACCINATION STATUS:  There is no immunization history on file for this patient.  Diabetes  She presents for her follow-up diabetic visit. She has type 2 diabetes mellitus. Onset time: She was diagnosed at approximate age of 70 years. Her disease course has been worsening. There are no hypoglycemic associated symptoms. Pertinent negatives for hypoglycemia include no  confusion, headaches, pallor or seizures. Associated symptoms include fatigue, polydipsia and polyuria. Pertinent negatives for diabetes include no chest pain and no polyphagia. There are no hypoglycemic complications. Symptoms are worsening. Diabetic complications include peripheral neuropathy and retinopathy. Pertinent negatives for diabetic complications include no autonomic neuropathy. Risk factors for coronary artery disease include diabetes mellitus, dyslipidemia, hypertension, obesity and sedentary lifestyle. She is compliant with treatment most of the time. Her weight is fluctuating minimally. She is following a generally unhealthy diet. She has not had a previous visit with a dietitian (She missed her appointment.). She never participates in exercise. Her home blood glucose trend is increasing steadily. (She returns with no meter nor logs to review today.  Her previsit labs show A1c of 10.9% increasing from 9.3% during her last visit.) An ACE inhibitor/angiotensin II receptor blocker is not being taken. Eye exam is current.  Hyperlipidemia  This is a chronic problem. The current episode started more than 1 year ago. The problem is uncontrolled. Recent lipid tests were reviewed and are high. Exacerbating diseases include diabetes, hypothyroidism and obesity. Pertinent negatives include no chest pain, myalgias or shortness of breath. She is currently on no antihyperlipidemic treatment.  Thyroid Problem  Presents for initial visit. Symptoms include fatigue. Patient reports no cold intolerance, diarrhea, heat intolerance or palpitations. The following procedures have not been performed: radioiodine uptake scan and thyroidectomy. Her past medical history is significant for diabetes and hyperlipidemia.     Review of Systems  Constitutional: Positive for fatigue. Negative for unexpected weight change.  HENT: Negative for trouble swallowing and voice change.   Eyes: Negative for visual disturbance.   Respiratory: Negative for cough, shortness of breath and wheezing.   Cardiovascular: Negative for chest pain, palpitations and leg swelling.  Gastrointestinal: Negative for diarrhea, nausea and vomiting.  Endocrine: Positive for polydipsia and polyuria. Negative for cold intolerance, heat intolerance and polyphagia.  Musculoskeletal: Negative for arthralgias and myalgias.  Skin: Negative for color change, pallor, rash and wound.  Neurological: Negative for seizures and headaches.  Psychiatric/Behavioral: Negative for confusion and suicidal ideas.    Objective:    BP (!) 151/89   Pulse 90   Ht 5' 7.5" (1.715 m)   Wt (!) 353 lb (160.1 kg)   BMI 54.47 kg/m   Wt Readings from Last 3 Encounters:  02/09/18 (!) 353 lb (160.1 kg)  10/20/17 (!) 365 lb 3.2 oz (165.7 kg)  10/16/17 (!) 358 lb (162.4 kg)    Physical Exam  Constitutional: She is oriented to person, place, and time. She appears well-developed.  She has poor hygiene generally.  HENT:  Head: Normocephalic and atraumatic.  She has poor dentition.  Eyes:  EOM are normal.  Neck: Normal range of motion. Neck supple. No tracheal deviation present. No thyromegaly present.  Cardiovascular: Normal rate.  Pulmonary/Chest: Effort normal.  Abdominal: There is no tenderness. There is no guarding.  Musculoskeletal: Normal range of motion. She exhibits no edema.  Neurological: She is alert and oriented to person, place, and time. No cranial nerve deficit. Coordination normal.  Skin: Skin is warm and dry. No rash noted. No erythema. No pallor.  Psychiatric: She has a normal mood and affect. Judgment normal.  Reluctant , passive affect .    Results for orders placed or performed in visit on 02/02/18  COMPLETE METABOLIC PANEL WITH GFR  Result Value Ref Range   Glucose, Bld 325 (H) 65 - 139 mg/dL   BUN 17 7 - 25 mg/dL   Creat 0.94 0.50 - 1.05 mg/dL   GFR, Est Non African American 71 > OR = 60 mL/min/1.76m   GFR, Est African American  82 > OR = 60 mL/min/1.758m  BUN/Creatinine Ratio NOT APPLICABLE 6 - 22 (calc)   Sodium 134 (L) 135 - 146 mmol/L   Potassium 4.9 3.5 - 5.3 mmol/L   Chloride 98 98 - 110 mmol/L   CO2 30 20 - 32 mmol/L   Calcium 8.8 8.6 - 10.4 mg/dL   Total Protein 7.0 6.1 - 8.1 g/dL   Albumin 3.3 (L) 3.6 - 5.1 g/dL   Globulin 3.7 1.9 - 3.7 g/dL (calc)   AG Ratio 0.9 (L) 1.0 - 2.5 (calc)   Total Bilirubin 0.5 0.2 - 1.2 mg/dL   Alkaline phosphatase (APISO) 90 33 - 130 U/L   AST 13 10 - 35 U/L   ALT 12 6 - 29 U/L  Hemoglobin A1c  Result Value Ref Range   Hgb A1c MFr Bld 10.9 (H) <5.7 % of total Hgb   Mean Plasma Glucose 266 (calc)   eAG (mmol/L) 14.7 (calc)   Diabetic Labs (most recent): Lab Results  Component Value Date   HGBA1C 10.9 (H) 02/02/2018   HGBA1C 9.3 (H) 10/08/2017   HGBA1C 9.2 (H) 07/14/2017    Lipid Panel     Component Value Date/Time   CHOL 166 04/09/2017 1159   TRIG 131 04/09/2017 1159   HDL 37 (L) 04/09/2017 1159   CHOLHDL 4.5 04/09/2017 1159   VLDL 42 (H) 04/25/2015 1001   LDLCALC 106 (H) 04/09/2017 1159     Assessment & Plan:   1. Uncontrolled type 2 diabetes mellitus with complication, with long-term current use of insulin ([facility]  Her diabetes is complicated by neuropathy , noncompliance/nonadherence, and possible retinopathy. - Patient has currently uncontrolled symptomatic type 2 DM of approximately since age [age]ears duration. - She came without any meter nor logs to review.  Her previsit labs show A1c of 10.9% increasing from 9.3%.   -She remains at extremely high risk for more acute and chronic complications of diabetes which include CAD, CVA, CKD, retinopathy, and neuropathy. These are all discussed in detail with the patient.  - I have counseled the patient on diet management and weight loss, by adopting a carbohydrate restricted/protein rich diet.  - Patient is advised to stick to a routine mealtimes to eat 3 meals  a day and avoid unnecessary snacks ( to  snack only to correct hypoglycemia).   -  Suggestion is made for her to avoid simple carbohydrates  from her diet including Cakes, Sweet Desserts / Pastries, Ice Cream, Soda (diet and regular), Sweet Tea, Candies, Chips, Cookies,  Store Bought Juices, Alcohol in Excess of  1-2 drinks a day, Artificial Sweeteners, and "Sugar-free" Products. This will help patient to have stable blood glucose profile and potentially avoid unintended weight gain.  - I encouraged the patient to switch to  unprocessed or minimally processed complex starch and increased protein intake (animal or plant source), fruits, and vegetables.   - I have approached patient with the following individualized plan to manage diabetes and patient agrees:   -She remains noncompliant/nonadherent to monitoring and covering her meals with insulin.   -She comes in with no logs nor meter which makes it difficult to adjust her treatment safely.  -For now, I advised her to resume and continue Tresiba 80 units nightly,  NovoLog  20 units 3 times a day before meals for pre-meal blood glucose above 90 mg/dL, associated with strict monitoring of blood glucose 4 times a day-before meals and at bedtime.  - She is warned not to take insulin without monitoring blood glucose properly. -She is given individualized chart to correct hyperglycemia above 150 mg/dL. - She may need higher dose of insulin once her commitment for proper monitoring her blood glucose is assured. -She will benefit from continuous glucose monitoring.  -Patient is encouraged to call clinic for blood glucose levels less than 70 or above 300 mg /dl. - she is adamant saying that she does not tolerate MTF, Invokana, and [place].  - Patient specific target  A1c;  LDL, HDL, Triglycerides, and  Waist Circumference were discussed in detail.  2) BP/HTN: Her blood pressure is not controlled to target.  She is advised on salt  Restriction, along with her lisinopril 10 mg p.o. Daily.  3)  Lipids/HPL: LDL  Levels at 200.  She is on Crestor 10 mg p.o. nightly, however with inconsistent intake of this medication.   4)  Weight/Diet: she keeps missing her appointment with  CDE exercise, and carbohydrates information provided. She   5) hypothyroidism: -She will benefit from slight increase in her levothyroxine.  I discussed and advised her to continue levothyroxine  125 mcg p.o. daily before breakfast.      - We discussed about correct intake of levothyroxine, at fasting, with water, separated by at least 30 minutes from breakfast, and separated by more than 4 hours from calcium, iron, multivitamins, acid reflux medications (PPIs). -Patient is made aware of the fact that thyroid hormone replacement is needed for life, dose to be adjusted by periodic monitoring of thyroid function tests.  6) Chronic Care/Health Maintenance:  -Patient isinitiated on  Statin medications. She is encouraged to continue to follow up with Ophthalmology, Podiatrist at least yearly or according to recommendations, and advised to stay away from smoking. I have recommended yearly flu vaccine and pneumonia vaccination at least every 5 years; moderate intensity exercise for up to 150 minutes weekly; and  sleep for at least 7 hours a day.  I advised patient to maintain close follow up with their PCP for primary care needs.  - Time spent with the patient: 25 min, of which >50% was spent in reviewing her blood glucose logs , discussing her hypo- and hyper-glycemic episodes, reviewing her current and  previous labs and insulin doses and developing a plan to avoid hypo- and hyper-glycemia. Please refer to Patient Instructions for Blood Glucose Monitoring and Insulin/Medications Dosing Guide"  in media tab for additional information. Michelle Skinner participated in the discussions, expressed understanding, and voiced agreement with the above plans.  All questions were answered to  her satisfaction. she is encouraged to contact  clinic should she have any questions or concerns prior to her return visit.   Follow up plan: Return in about 1 month (around 03/09/2018) for Follow up with Meter and Logs Only - no Labs.  Glade Lloyd, MD Phone: [phone]  Fax: [phone]  -  This note was partially dictated with voice recognition software. Similar sounding words can be transcribed inadequately or may not  be corrected upon review.  02/09/2018, 11:12 AM

## 2018-02-10 LAB — HELICOBACTER PYLORI  SPECIAL ANTIGEN
MICRO NUMBER: 90924425
RESULT:: DETECTED — AB
SPECIMEN QUALITY: ADEQUATE

## 2018-02-11 NOTE — Progress Notes (Signed)
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 I am not sure what regimen she received at outside facility. If it was Prevpac, could treat with Pylera. Please let patient know that she will need treatment and find out if she knows what she took. Further rec's per Magda Paganini when she returns.

## 2018-02-12 NOTE — Progress Notes (Signed)
Pt said she has never been treated for H Pylori. She said she has never heard of it.

## 2018-02-16 NOTE — Progress Notes (Signed)
Michelle Skinner, please advise!

## 2018-02-18 NOTE — Progress Notes (Signed)
Per pt, I checked [facility], she has not received meds there since 2011. I called [facility] in [place] and the last they can see is Sept  2 years ago.  Michelle Skinner is checking on the records from Dr. Britta Mccreedy.

## 2018-02-19 NOTE — Progress Notes (Signed)
Manuela Schwartz received records and placed in box for Neil Crouch, [place].

## 2018-03-03 ENCOUNTER — Ambulatory Visit: Payer: Medicare Other | Admitting: Internal Medicine

## 2018-03-05 ENCOUNTER — Other Ambulatory Visit: Payer: Self-pay | Admitting: Gastroenterology

## 2018-03-05 MED ORDER — BIS SUBCIT-METRONID-TETRACYC 140-125-125 MG PO CAPS
3.0000 | ORAL_CAPSULE | Freq: Three times a day (TID) | ORAL | 0 refills | Status: AC
Start: 2018-03-05 — End: 2018-03-15

## 2018-03-05 MED ORDER — OMEPRAZOLE 20 MG PO CPDR
20.0000 mg | DELAYED_RELEASE_CAPSULE | Freq: Two times a day (BID) | ORAL | 0 refills | Status: DC
Start: 2018-03-05 — End: 2020-09-11

## 2018-03-11 ENCOUNTER — Encounter: Payer: Self-pay | Admitting: "Endocrinology

## 2018-03-11 ENCOUNTER — Ambulatory Visit (INDEPENDENT_AMBULATORY_CARE_PROVIDER_SITE_OTHER): Payer: Medicare Other | Admitting: "Endocrinology

## 2018-03-11 VITALS — BP 130/82 | HR 82 | Ht 67.5 in | Wt 353.0 lb

## 2018-03-11 DIAGNOSIS — E039 Hypothyroidism, unspecified: Secondary | ICD-10-CM | POA: Diagnosis not present

## 2018-03-11 DIAGNOSIS — Z794 Long term (current) use of insulin: Secondary | ICD-10-CM

## 2018-03-11 DIAGNOSIS — I10 Essential (primary) hypertension: Secondary | ICD-10-CM | POA: Diagnosis not present

## 2018-03-11 DIAGNOSIS — E782 Mixed hyperlipidemia: Secondary | ICD-10-CM

## 2018-03-11 DIAGNOSIS — E118 Type 2 diabetes mellitus with unspecified complications: Secondary | ICD-10-CM | POA: Diagnosis not present

## 2018-03-11 DIAGNOSIS — E1165 Type 2 diabetes mellitus with hyperglycemia: Secondary | ICD-10-CM

## 2018-03-11 DIAGNOSIS — IMO0002 Reserved for concepts with insufficient information to code with codable children: Secondary | ICD-10-CM

## 2018-03-11 MED ORDER — LEVOTHYROXINE SODIUM 125 MCG PO TABS: 125.0000 ug | ORAL_TABLET | Freq: Every day | ORAL | 0 refills | Status: DC

## 2018-03-11 MED ORDER — INSULIN DEGLUDEC 200 UNIT/ML ~~LOC~~ SOPN: 90.0000 [IU] | PEN_INJECTOR | Freq: Every day | SUBCUTANEOUS | 0 refills | Status: DC

## 2018-03-11 MED ORDER — INSULIN ASPART 100 UNIT/ML FLEXPEN: 20.0000 [IU] | PEN_INJECTOR | Freq: Three times a day (TID) | SUBCUTANEOUS | 0 refills | Status: DC

## 2018-03-11 NOTE — Patient Instructions (Signed)

## 2018-03-11 NOTE — Progress Notes (Signed)
Endocrinology follow-up note  Subjective:    Patient ID: Michelle Skinner, female    DOB: 1965/09/06,    Past Medical History:  Diagnosis Date  . Cervical cancer ([place])   . Depression   . Diabetes mellitus, type II ([place])   . GERD (gastroesophageal reflux disease)   . Hyperlipidemia   . PTSD (post-traumatic stress disorder)    Past Surgical History:  Procedure Laterality Date  . ABDOMINAL HYSTERECTOMY    . OTHER SURGICAL HISTORY     R Foot  . UMBILICAL HERNIA REPAIR  2007   Social History   Socioeconomic History  . Marital status: Married    Spouse name: Not on file  . Number of children: Not on file  . Years of education: Not on file  . Highest education level: Not on file  Occupational History  . Not on file  Social Needs  . Financial resource strain: Not on file  . Food insecurity:    Worry: Not on file    Inability: Not on file  . Transportation needs:    Medical: Not on file    Non-medical: Not on file  Tobacco Use  . Smoking status: Never Smoker  . Smokeless tobacco: Never Used  Substance and Sexual Activity  . Alcohol use: No    Alcohol/week: 0.0 standard drinks  . Drug use: No  . Sexual activity: Not on file  Lifestyle  . Physical activity:    Days per week: Not on file    Minutes per session: Not on file  . Stress: Not on file  Relationships  . Social connections:    Talks on phone: Not on file    Gets together: Not on file    Attends religious service: Not on file    Active member of club or organization: Not on file    Attends meetings of clubs or organizations: Not on file    Relationship status: Not on file  Other Topics Concern  . Not on file  Social History Narrative  . Not on file   Outpatient Encounter Medications as of 03/11/2018  Medication Sig  . atorvastatin (LIPITOR) 20 MG tablet Take 1 tablet (20 mg total) by mouth daily at 6 PM.  . bismuth-metronidazole-tetracycline (PYLERA) 140-125-125 MG capsule Take 3 capsules by mouth 4  (four) times daily - after meals and at bedtime for 10 days. Take with omeprazole 17m twice daily for 10 days.  . Blood Glucose Monitoring Suppl (ONE TOUCH ULTRA 2) w/Device KIT Use to test blood glucose  . glucose blood (ONE TOUCH ULTRA TEST) test strip USE TO TEST BLOOD GLUCOSE 4 x daily E11.65  . insulin aspart (NOVOLOG FLEXPEN) 100 UNIT/ML FlexPen Inject 20-26 Units into the skin 3 (three) times daily before meals.  . Insulin Degludec (TRESIBA FLEXTOUCH) 200 UNIT/ML SOPN Inject 90 Units into the skin at bedtime.  .Marland KitchenLANCETS ULTRA THIN MISC Use as directed qid. E11.65  . levofloxacin (LEVAQUIN) 500 MG tablet daily.  .Marland Kitchenlevothyroxine (SYNTHROID, LEVOTHROID) 125 MCG tablet Take 1 tablet (125 mcg total) by mouth daily.  .Marland Kitchenlisinopril (PRINIVIL,ZESTRIL) 10 MG tablet Take 1 tablet (10 mg total) by mouth daily.  .Marland Kitchenomeprazole (PRILOSEC) 20 MG capsule Take 1 capsule (20 mg total) by mouth 2 (two) times daily before a meal. For 10 days while on Pylera for H.pylori  . pantoprazole (PROTONIX) 40 MG tablet Take 1 tablet (40 mg total) by mouth daily.  . pregabalin (LYRICA) 75 MG capsule Take 75 mg  by mouth as needed.   . [DISCONTINUED] Continuous Blood Gluc Receiver (FREESTYLE LIBRE 14 DAY READER) DEVI 1 each by Does not apply route every 14 (fourteen) days.  . [DISCONTINUED] Continuous Blood Gluc Sensor (FREESTYLE LIBRE 14 DAY SENSOR) MISC Inject 1 each into the skin every 14 (fourteen) days. Use as directed.  . [DISCONTINUED] insulin aspart (NOVOLOG FLEXPEN) 100 UNIT/ML FlexPen Inject 18-24 Units into the skin 3 (three) times daily with meals.   . [DISCONTINUED] levothyroxine (SYNTHROID, LEVOTHROID) 125 MCG tablet Take 1 tablet (125 mcg total) by mouth daily.  . [DISCONTINUED] sulfamethoxazole-trimethoprim (BACTRIM DS,SEPTRA DS) 800-160 MG tablet Take 1 tablet by mouth 2 (two) times daily.  . [DISCONTINUED] TRESIBA FLEXTOUCH 200 UNIT/ML SOPN INJECT 80 UNITS  SUBCUTANEOUSLY DAILY   No facility-administered  encounter medications on file as of 03/11/2018.    ALLERGIES: Allergies  Allergen Reactions  . Bactrim [Sulfamethoxazole-Trimethoprim]     Itch all over.   [name]   . Gabapentin   . Invokana [Canagliflozin]    VACCINATION STATUS:  There is no immunization history on file for this patient.  Diabetes  She presents for her follow-up diabetic visit. She has type 2 diabetes mellitus. Onset time: She was diagnosed at approximate age of 89 years. Her disease course has been worsening. There are no hypoglycemic associated symptoms. Pertinent negatives for hypoglycemia include no confusion, headaches, pallor or seizures. Associated symptoms include fatigue, polydipsia and polyuria. Pertinent negatives for diabetes include no chest pain and no polyphagia. There are no hypoglycemic complications. Symptoms are worsening. Diabetic complications include peripheral neuropathy and retinopathy. Pertinent negatives for diabetic complications include no autonomic neuropathy. Risk factors for coronary artery disease include diabetes mellitus, dyslipidemia, hypertension, obesity and sedentary lifestyle. She is compliant with treatment most of the time. Her weight is fluctuating minimally. She is following a generally unhealthy diet. She has not had a previous visit with a dietitian (She missed her appointment.). She never participates in exercise. Her home blood glucose trend is increasing steadily. Her breakfast blood glucose range is generally >200 mg/dl. Her lunch blood glucose range is generally >200 mg/dl. Her dinner blood glucose range is generally >200 mg/dl. Her bedtime blood glucose range is generally >200 mg/dl. Her overall blood glucose range is >200 mg/dl. (She returns with her logs showing persistently above target glycemic profile, recent A1c was 10.9% increasing from 9.3%.  ) An ACE inhibitor/angiotensin II receptor blocker is not being taken. Eye exam is current.  Hyperlipidemia  This is a chronic  problem. The current episode started more than 1 year ago. The problem is uncontrolled. Recent lipid tests were reviewed and are high. Exacerbating diseases include diabetes, hypothyroidism and obesity. Pertinent negatives include no chest pain, myalgias or shortness of breath. She is currently on no antihyperlipidemic treatment.  Thyroid Problem  Presents for initial visit. Symptoms include fatigue. Patient reports no cold intolerance, diarrhea, heat intolerance or palpitations. The following procedures have not been performed: radioiodine uptake scan and thyroidectomy. Her past medical history is significant for diabetes and hyperlipidemia.     Review of Systems  Constitutional: Positive for fatigue. Negative for unexpected weight change.  HENT: Negative for trouble swallowing and voice change.   Eyes: Negative for visual disturbance.  Respiratory: Negative for cough, shortness of breath and wheezing.   Cardiovascular: Negative for chest pain, palpitations and leg swelling.  Gastrointestinal: Negative for diarrhea, nausea and vomiting.  Endocrine: Positive for polydipsia and polyuria. Negative for cold intolerance, heat intolerance and polyphagia.  Musculoskeletal: Negative  for arthralgias and myalgias.  Skin: Negative for color change, pallor, rash and wound.  Neurological: Negative for seizures and headaches.  Psychiatric/Behavioral: Negative for confusion and suicidal ideas.    Objective:    BP 130/82   Pulse 82   Ht 5' 7.5" (1.715 m)   Wt (!) 353 lb (160.1 kg)   BMI 54.47 kg/m   Wt Readings from Last 3 Encounters:  03/11/18 (!) 353 lb (160.1 kg)  02/09/18 (!) 353 lb (160.1 kg)  10/20/17 (!) 365 lb 3.2 oz (165.7 kg)    Physical Exam  Constitutional: She is oriented to person, place, and time. She appears well-developed.  She has poor hygiene generally.  HENT:  Head: Normocephalic and atraumatic.  She has poor dentition.  Eyes: EOM are normal.  Neck: Normal range of  motion. Neck supple. No tracheal deviation present. No thyromegaly present.  Cardiovascular: Normal rate.  Pulmonary/Chest: Effort normal.  Abdominal: There is no tenderness. There is no guarding.  Musculoskeletal: Normal range of motion. She exhibits no edema.  Neurological: She is alert and oriented to person, place, and time. No cranial nerve deficit. Coordination normal.  Skin: Skin is warm and dry. No rash noted. No erythema. No pallor.  Psychiatric: She has a normal mood and affect. Judgment normal.  Reluctant , passive affect .    Results for orders placed or performed in visit on 02/02/18  COMPLETE METABOLIC PANEL WITH GFR  Result Value Ref Range   Glucose, Bld 325 (H) 65 - 139 mg/dL   BUN 17 7 - 25 mg/dL   Creat 0.94 0.50 - 1.05 mg/dL   GFR, Est Non African American 71 > OR = 60 mL/min/1.58m   GFR, Est African American 82 > OR = 60 mL/min/1.716m  BUN/Creatinine Ratio NOT APPLICABLE 6 - 22 (calc)   Sodium 134 (L) 135 - 146 mmol/L   Potassium 4.9 3.5 - 5.3 mmol/L   Chloride 98 98 - 110 mmol/L   CO2 30 20 - 32 mmol/L   Calcium 8.8 8.6 - 10.4 mg/dL   Total Protein 7.0 6.1 - 8.1 g/dL   Albumin 3.3 (L) 3.6 - 5.1 g/dL   Globulin 3.7 1.9 - 3.7 g/dL (calc)   AG Ratio 0.9 (L) 1.0 - 2.5 (calc)   Total Bilirubin 0.5 0.2 - 1.2 mg/dL   Alkaline phosphatase (APISO) 90 33 - 130 U/L   AST 13 10 - 35 U/L   ALT 12 6 - 29 U/L  Hemoglobin A1c  Result Value Ref Range   Hgb A1c MFr Bld 10.9 (H) <5.7 % of total Hgb   Mean Plasma Glucose 266 (calc)   eAG (mmol/L) 14.7 (calc)   Diabetic Labs (most recent): Lab Results  Component Value Date   HGBA1C 10.9 (H) 02/02/2018   HGBA1C 9.3 (H) 10/08/2017   HGBA1C 9.2 (H) 07/14/2017    Lipid Panel     Component Value Date/Time   CHOL 166 04/09/2017 1159   TRIG 131 04/09/2017 1159   HDL 37 (L) 04/09/2017 1159   CHOLHDL 4.5 04/09/2017 1159   VLDL 42 (H) 04/25/2015 1001   LDLCALC 106 (H) 04/09/2017 1159     Assessment & Plan:   1.  Uncontrolled type 2 diabetes mellitus with complication, with long-term current use of insulin (HCC)   Her diabetes is complicated by neuropathy , noncompliance/nonadherence, and possible retinopathy. - She has currently uncontrolled symptomatic type 2 DM of approximately since age [age]ears duration. - She came with her logs  showing significantly above target glycemic profile, both fasting and postprandial.  She has some rare and random mild hypoglycemic episodes.  - Her previsit labs show A1c of 10.9% increasing from 9.3%.   -She remains at extremely high risk for more acute and chronic complications of diabetes which include CAD, CVA, CKD, retinopathy, and neuropathy. These are all discussed in detail with the patient.  - I have counseled the patient on diet management and weight loss, by adopting a carbohydrate restricted/protein rich diet.  - Patient is advised to stick to a routine mealtimes to eat 3 meals  a day and avoid unnecessary snacks ( to snack only to correct hypoglycemia).   -She admits to dietary indiscretions. -  Suggestion is made for her to avoid simple carbohydrates  from her diet including Cakes, Sweet Desserts / Pastries, Ice Cream, Soda (diet and regular), Sweet Tea, Candies, Chips, Cookies, Store Bought Juices, Alcohol in Excess of  1-2 drinks a day, Artificial Sweeteners, and "Sugar-free" Products. This will help patient to have stable blood glucose profile and potentially avoid unintended weight gain.  - I encouraged the patient to switch to  unprocessed or minimally processed complex starch and increased protein intake (animal or plant source), fruits, and vegetables.   - I have approached patient with the following individualized plan to manage diabetes and patient agrees:    -  I advised her to increase [place] to 90 units nightly, continue NovoLog  20 units 3 times a day before meals for pre-meal blood glucose above 90 mg/dL, associated with strict monitoring of  blood glucose 4 times a day-before meals and at bedtime.  - She is warned not to take insulin without monitoring blood glucose properly. -She is given individualized chart to correct hyperglycemia above 150 mg/dL. - She may need higher dose of insulin once her commitment for proper monitoring her blood glucose is assured. -She will benefit from continuous glucose monitoring.  -Patient is encouraged to call clinic for blood glucose levels less than 70 or above 300 mg /dl. - she is adamant saying that she does not tolerate MTF, Invokana, and [place].  - Patient specific target  A1c;  LDL, HDL, Triglycerides, and  Waist Circumference were discussed in detail.  2) BP/HTN: Her blood pressure is controlled to target.    She is advised on salt  Restriction, along with her lisinopril 10 mg p.o. Daily.  3) Lipids/HPL: LDL  Levels at  106.  She is on Crestor 10 mg p.o. nightly, however with inconsistent intake of this medication.   4)  Weight/Diet: she keeps missing her appointment with  CDE exercise, and carbohydrates information provided. She   5) hypothyroidism: -She is advised to continue levothyroxine 125 mcg p.o. daily before breakfast.     - We discussed about correct intake of levothyroxine, at fasting, with water, separated by at least 30 minutes from breakfast, and separated by more than 4 hours from calcium, iron, multivitamins, acid reflux medications (PPIs). -Patient is made aware of the fact that thyroid hormone replacement is needed for life, dose to be adjusted by periodic monitoring of thyroid function tests.  6) Chronic Care/Health Maintenance:  -Patient isinitiated on  Statin medications. She is encouraged to continue to follow up with Ophthalmology, Podiatrist at least yearly or according to recommendations, and advised to stay away from smoking. I have recommended yearly flu vaccine and pneumonia vaccination at least every 5 years; moderate intensity exercise for up to 150 minutes  weekly; and  sleep  for at least 7 hours a day.  I advised patient to maintain close follow up with their PCP for primary care needs.  - Time spent with the patient: 25 min, of which >50% was spent in reviewing her blood glucose logs , discussing her hypo- and hyper-glycemic episodes, reviewing her current and  previous labs and insulin doses and developing a plan to avoid hypo- and hyper-glycemia. Please refer to Patient Instructions for Blood Glucose Monitoring and Insulin/Medications Dosing Guide"  in media tab for additional information. Michelle Skinner participated in the discussions, expressed understanding, and voiced agreement with the above plans.  All questions were answered to her satisfaction. she is encouraged to contact clinic should she have any questions or concerns prior to her return visit.   Follow up plan: Return in about 9 weeks (around 05/13/2018) for Meter, and Logs.  Glade Lloyd, MD Phone: [phone]  Fax: [phone]  -  This note was partially dictated with voice recognition software. Similar sounding words can be transcribed inadequately or may not  be corrected upon review.  03/11/2018, 1:36 PM

## 2018-03-18 ENCOUNTER — Other Ambulatory Visit: Payer: Self-pay

## 2018-03-18 MED ORDER — INSULIN LISPRO 100 UNIT/ML (KWIKPEN): 20.0000 [IU] | PEN_INJECTOR | Freq: Three times a day (TID) | SUBCUTANEOUS | 0 refills | Status: DC

## 2018-04-10 ENCOUNTER — Encounter: Payer: Self-pay | Admitting: Internal Medicine

## 2018-04-10 ENCOUNTER — Other Ambulatory Visit: Payer: Self-pay

## 2018-04-10 ENCOUNTER — Ambulatory Visit (INDEPENDENT_AMBULATORY_CARE_PROVIDER_SITE_OTHER): Payer: Medicare Other | Admitting: Internal Medicine

## 2018-04-10 VITALS — BP 136/77 | HR 83 | Temp 97.8°F | Ht 67.5 in | Wt 348.6 lb

## 2018-04-10 DIAGNOSIS — Z1211 Encounter for screening for malignant neoplasm of colon: Secondary | ICD-10-CM

## 2018-04-10 DIAGNOSIS — K219 Gastro-esophageal reflux disease without esophagitis: Secondary | ICD-10-CM | POA: Diagnosis not present

## 2018-04-10 DIAGNOSIS — Z1212 Encounter for screening for malignant neoplasm of rectum: Secondary | ICD-10-CM

## 2018-04-10 MED ORDER — CLENPIQ 10-3.5-12 MG-GM -GM/160ML PO SOLN: 1.0000 | Freq: Once | ORAL | 0 refills | Status: AC

## 2018-04-10 MED ORDER — CLENPIQ 10-3.5-12 MG-GM -GM/160ML PO SOLN: 1.0000 | Freq: Once | ORAL | 0 refills | Status: DC

## 2018-04-10 NOTE — Progress Notes (Signed)
Primary Care Physician:  Glenda Chroman, MD Primary Gastroenterologist:  Dr. Gala Romney  Pre-Procedure History & Physical: HPI:  Michelle Skinner is a 52 y.o. female here for GERD, acute self-limiting upper abdominal pain, nausea and vomiting back in the spring.  ED visit with CT demonstrated abnormality in the of the ampulla.  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  Sludge in the gallbladder.  She has done well.  GERD symptoms well controlled on Protonix 40 mg daily.  A question regarding H. pylori eradication previously  -  plans are to do a stool antigen test once she is off antibiotics.  She has 4 more days of Levaquin after a lengthy course for diabetic foot infection. Diabetes poorly controlled with hemoglobin A1c is above 10..  She denies any bowel symptoms.  No family history of colon cancer.  No prior colonoscopy.  She is due for screening examination at this time.  Past Medical History:  Diagnosis Date  . Cervical cancer ([place])   . Depression   . Diabetes mellitus, type II ([place])   . GERD (gastroesophageal reflux disease)   . Hyperlipidemia   . PTSD (post-traumatic stress disorder)     Past Surgical History:  Procedure Laterality Date  . ABDOMINAL HYSTERECTOMY    . OTHER SURGICAL HISTORY     R Foot  . UMBILICAL HERNIA REPAIR  2007    Prior to Admission medications   Medication Sig Start Date End Date Taking? Authorizing Provider  atorvastatin (LIPITOR) 20 MG tablet Take 1 tablet (20 mg total) by mouth daily at 6 PM. 02/09/18  Yes [name], Marella Chimes, MD  Blood Glucose Monitoring Suppl (ONE TOUCH ULTRA 2) w/Device KIT Use to test blood glucose 04/17/17  Yes [name], Marella Chimes, MD  CALCIUM PO Take by mouth. 2 chews daily   Yes [provider]  glucose blood (ONE TOUCH ULTRA TEST) test strip USE TO TEST BLOOD GLUCOSE 4 x daily E11.65 07/18/17  Yes [name], Marella Chimes, MD  Insulin Degludec (TRESIBA FLEXTOUCH) 200  UNIT/ML SOPN Inject 90 Units into the skin at bedtime. 03/11/18  Yes [name], Marella Chimes, MD  insulin lispro (HUMALOG) 100 UNIT/ML KiwkPen Inject 0.2-0.26 mLs (20-26 Units total) into the skin 3 (three) times daily before meals. 03/18/18  Yes [name], Marella Chimes, MD  LANCETS ULTRA THIN MISC Use as directed qid. E11.65 01/28/17  Yes [name], Marella Chimes, MD  levofloxacin (LEVAQUIN) 500 MG tablet daily. 03/03/18  Yes [provider]  levothyroxine (SYNTHROID, LEVOTHROID) 125 MCG tablet Take 1 tablet (125 mcg total) by mouth daily. 03/11/18  Yes [name], Marella Chimes, MD  lisinopril (PRINIVIL,ZESTRIL) 10 MG tablet Take 1 tablet (10 mg total) by mouth daily. 02/09/18  Yes Cassandria Anger, MD  Multiple Vitamin (MULTIVITAMIN) tablet Take 1 tablet by mouth daily.   Yes [provider]  pantoprazole (PROTONIX) 40 MG tablet Take 1 tablet (40 mg total) by mouth daily. 08/06/17  Yes [name], Marella Chimes, MD  pregabalin (LYRICA) 75 MG capsule Take 75 mg by mouth as needed.    Yes [provider]  VITAMIN K PO Take 1 tablet by mouth daily.   Yes [provider]  insulin aspart (NOVOLOG FLEXPEN) 100 UNIT/ML FlexPen Inject 20-26 Units into the skin 3 (three) times daily before meals. Patient not taking: Reported on 04/10/2018 03/11/18   Cassandria Anger, MD  omeprazole (PRILOSEC) 20 MG capsule Take 1 capsule (20 mg total) by mouth 2 (  two) times daily before a meal. For 10 days while on Pylera for H.pylori Patient not taking: Reported on 04/10/2018 03/05/18   [name], PA-C    Allergies as of 04/10/2018 - Review Complete 04/10/2018  Allergen Reaction Noted  . Bactrim [sulfamethoxazole-trimethoprim]  04/25/2015  . [place]  10/20/2017  . Gabapentin  04/25/2015  . Invokana [canagliflozin]  04/25/2015    Family History  Problem Relation Age of Onset  . Hypertension Mother   . Diabetes Father   . Hyperlipidemia Father   . CAD Father   . Stroke Father   .  Osteoporosis Maternal Grandmother   . Cancer Maternal Grandmother   . Hypertension Maternal Grandmother   . Colon cancer Neg Hx     Social History   Socioeconomic History  . Marital status: Married    Spouse name: Not on file  . Number of children: Not on file  . Years of education: Not on file  . Highest education level: Not on file  Occupational History  . Not on file  Social Needs  . Financial resource strain: Not on file  . Food insecurity:    Worry: Not on file    Inability: Not on file  . Transportation needs:    Medical: Not on file    Non-medical: Not on file  Tobacco Use  . Smoking status: Never Smoker  . Smokeless tobacco: Never Used  Substance and Sexual Activity  . Alcohol use: No    Alcohol/week: 0.0 standard drinks  . Drug use: No  . Sexual activity: Not on file  Lifestyle  . Physical activity:    Days per week: Not on file    Minutes per session: Not on file  . Stress: Not on file  Relationships  . Social connections:    Talks on phone: Not on file    Gets together: Not on file    Attends religious service: Not on file    Active member of club or organization: Not on file    Attends meetings of clubs or organizations: Not on file    Relationship status: Not on file  . Intimate partner violence:    Fear of current or ex partner: Not on file    Emotionally abused: Not on file    Physically abused: Not on file    Forced sexual activity: Not on file  Other Topics Concern  . Not on file  Social History Narrative  . Not on file    Review of Systems: See HPI, otherwise negative ROS  Physical Exam: BP 136/77   Pulse 83   Temp 97.8 F (36.6 C) (Oral)   Ht 5' 7.5" (1.715 m)   Wt (!) 348 lb 9.6 oz (158.1 kg)   BMI 53.79 kg/m  General:   Morbidly obese.  Chronically ill-appearing.  Accompanied by her husband. in NAD Eyes:  Sclera clear, no icterus.   Conjunctiva pink. Neck:  Supple; no masses or thyromegaly. No significant cervical  adenopathy. Lungs:  Clear throughout to auscultation.   No wheezes, crackles, or rhonchi. No acute distress. Heart:  Regular rate and rhythm; no murmurs, clicks, rubs,  or gallops. Abdomen: Massively obese.  Positive bowel sounds soft nontender without obvious mass organomegaly  Pulses:  Normal pulses noted. Extremities: Massive lower extremity edema/chronic stasis changes.   Impression/Plan: 53 year old lady morbidly obese with multiple medical problems including poorly controlled diabetes .  Prior acute illness back the Spring lead to cross-sectional imaging.  Symptoms have resolved without  any significant findings on labs or cross-sectional imaging.  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  Chronic GERD well-controlled on once daily Protonix.  History of H Plylori previously.  Eradication not documented.  Due for first ever average risk colorectal cancer screening examination  Recommendations  Schedule a screening colonoscopy - propofol.  The risks, benefits, limitations, alternatives and imponderables have been reviewed with the patient. Questions have been answered. All parties are agreeable.   Novolog only 10-15 units 3x daily - day before procedure  Tresiba 50 units one and 2 nights before colonoscopy  Need 7:30 slot   Stop Protonix (pantoprazole ) for 2 weeks once antibiotics completed and we will do a stool antigen test to make sure Hp is gone.  Further recommendations to follow      Notice: This dictation was prepared with Dragon dictation along with smaller phrase technology. Any transcriptional errors that result from this process are unintentional and may not be corrected upon review.

## 2018-04-10 NOTE — Patient Instructions (Signed)
Schedule a screening colonoscopy - propofol  Novolog only 10-15 units 3x daily - day before procedure  Tresiba 50 units one and 2 nights before colonoscopy  Need 7:30 slot   Stop Protonix (pantoprazole ) for 2 weeks once antibiotics completed and we will do a stool antigen test to make sure stomach infection is gone.  Further recommendations to follow

## 2018-04-13 ENCOUNTER — Telehealth: Payer: Self-pay

## 2018-04-13 NOTE — Telephone Encounter (Signed)
Called and informed pt of pre-op appt 05/18/18 at 9:00am. Letter mailed.

## 2018-04-17 ENCOUNTER — Telehealth: Payer: Self-pay | Admitting: Internal Medicine

## 2018-04-17 NOTE — Telephone Encounter (Signed)
RECALL FOR H PYLORI STOOL ANTIGEN

## 2018-04-23 ENCOUNTER — Other Ambulatory Visit: Payer: Self-pay

## 2018-04-23 DIAGNOSIS — A048 Other specified bacterial intestinal infections: Secondary | ICD-10-CM

## 2018-04-23 NOTE — Telephone Encounter (Signed)
Orders sent to pt. Pt is aware that this needs to be done per RMR last apt 04/10/18.  Pt would like prep for procedure sent to [facility] in [place]. It was sent to [facility] and she wants to change it.

## 2018-04-24 MED ORDER — CLENPIQ 10-3.5-12 MG-GM -GM/160ML PO SOLN: 1.0000 | Freq: Once | ORAL | 0 refills | Status: AC

## 2018-04-24 NOTE — Telephone Encounter (Signed)
[facility] has been sent to the drug store

## 2018-04-24 NOTE — Addendum Note (Signed)
Addended by: Inge Rise on: 04/24/2018 07:25 AM   Modules accepted: Orders

## 2018-04-30 ENCOUNTER — Other Ambulatory Visit: Payer: Self-pay | Admitting: "Endocrinology

## 2018-05-09 LAB — COMPLETE METABOLIC PANEL WITH GFR
AG Ratio: 1.2 (calc) (ref 1.0–2.5)
ALT: 16 U/L (ref 6–29)
AST: 15 U/L (ref 10–35)
Albumin: 3.6 g/dL (ref 3.6–5.1)
Alkaline phosphatase (APISO): 108 U/L (ref 33–130)
BUN: 17 mg/dL (ref 7–25)
CO2: 30 mmol/L (ref 20–32)
Calcium: 9.3 mg/dL (ref 8.6–10.4)
Chloride: 102 mmol/L (ref 98–110)
Creat: 0.95 mg/dL (ref 0.50–1.05)
GFR, EST AFRICAN AMERICAN: 81 mL/min/{1.73_m2} (ref >=60)
GFR, Est Non African American: 70 mL/min/{1.73_m2} (ref >=60)
GLOBULIN: 3.1 g/dL (ref 1.9–3.7)
Glucose, Bld: 182 mg/dL — ABNORMAL HIGH (ref 65–99)
Potassium: 4.7 mmol/L (ref 3.5–5.3)
SODIUM: 139 mmol/L (ref 135–146)
TOTAL PROTEIN: 6.7 g/dL (ref 6.1–8.1)
Total Bilirubin: 0.5 mg/dL (ref 0.2–1.2)

## 2018-05-09 LAB — HEMOGLOBIN A1C
HEMOGLOBIN A1C: 9.9 %{Hb} — AB (ref <5.7)
MEAN PLASMA GLUCOSE: 237 (calc)
eAG (mmol/L): 13.2 (calc)

## 2018-05-09 LAB — TSH: TSH: 3.61 m[IU]/L

## 2018-05-09 LAB — T4, FREE: Free T4: 1.6 ng/dL (ref 0.8–1.8)

## 2018-05-13 ENCOUNTER — Ambulatory Visit (INDEPENDENT_AMBULATORY_CARE_PROVIDER_SITE_OTHER): Payer: Medicare Other | Admitting: "Endocrinology

## 2018-05-13 ENCOUNTER — Other Ambulatory Visit: Payer: Self-pay

## 2018-05-13 ENCOUNTER — Encounter: Payer: Self-pay | Admitting: "Endocrinology

## 2018-05-13 VITALS — BP 163/84 | HR 78 | Ht 67.5 in | Wt 352.0 lb

## 2018-05-13 DIAGNOSIS — E1165 Type 2 diabetes mellitus with hyperglycemia: Secondary | ICD-10-CM

## 2018-05-13 DIAGNOSIS — E559 Vitamin D deficiency, unspecified: Secondary | ICD-10-CM

## 2018-05-13 DIAGNOSIS — E782 Mixed hyperlipidemia: Secondary | ICD-10-CM

## 2018-05-13 DIAGNOSIS — E039 Hypothyroidism, unspecified: Secondary | ICD-10-CM

## 2018-05-13 DIAGNOSIS — I10 Essential (primary) hypertension: Secondary | ICD-10-CM

## 2018-05-13 DIAGNOSIS — E118 Type 2 diabetes mellitus with unspecified complications: Secondary | ICD-10-CM | POA: Diagnosis not present

## 2018-05-13 DIAGNOSIS — Z794 Long term (current) use of insulin: Secondary | ICD-10-CM

## 2018-05-13 DIAGNOSIS — IMO0002 Reserved for concepts with insufficient information to code with codable children: Secondary | ICD-10-CM

## 2018-05-13 MED ORDER — INSULIN LISPRO 200 UNIT/ML ~~LOC~~ SOPN: 25.0000 [IU] | PEN_INJECTOR | Freq: Three times a day (TID) | SUBCUTANEOUS | 2 refills | Status: DC

## 2018-05-13 MED ORDER — INSULIN DEGLUDEC 200 UNIT/ML ~~LOC~~ SOPN: 100.0000 [IU] | PEN_INJECTOR | Freq: Every day | SUBCUTANEOUS | 0 refills | Status: DC

## 2018-05-13 MED ORDER — VITAMIN D3 125 MCG (5000 UT) PO CAPS: 5000.0000 [IU] | ORAL_CAPSULE | Freq: Every day | ORAL | 0 refills | Status: DC

## 2018-05-13 MED ORDER — INSULIN LISPRO 200 UNIT/ML ~~LOC~~ SOPN: 25.0000 [IU] | PEN_INJECTOR | Freq: Three times a day (TID) | SUBCUTANEOUS | 0 refills | Status: DC

## 2018-05-13 MED ORDER — INSULIN DEGLUDEC 200 UNIT/ML ~~LOC~~ SOPN: 100.0000 [IU] | PEN_INJECTOR | Freq: Every day | SUBCUTANEOUS | 2 refills | Status: DC

## 2018-05-13 MED ORDER — LISINOPRIL 20 MG PO TABS: 20.0000 mg | ORAL_TABLET | Freq: Every day | ORAL | 3 refills | Status: DC

## 2018-05-13 NOTE — Patient Instructions (Signed)

## 2018-05-13 NOTE — Progress Notes (Signed)
Endocrinology follow-up note  Subjective:    Patient ID: Michelle Skinner, female    DOB: December 23, 1965,    Past Medical History:  Diagnosis Date  . Cervical cancer ([place])   . Depression   . Diabetes mellitus, type II ([place])   . GERD (gastroesophageal reflux disease)   . Hyperlipidemia   . PTSD (post-traumatic stress disorder)    Past Surgical History:  Procedure Laterality Date  . ABDOMINAL HYSTERECTOMY    . OTHER SURGICAL HISTORY     R Foot  . UMBILICAL HERNIA REPAIR  2007   Social History   Socioeconomic History  . Marital status: Married    Spouse name: Not on file  . Number of children: Not on file  . Years of education: Not on file  . Highest education level: Not on file  Occupational History  . Not on file  Social Needs  . Financial resource strain: Not on file  . Food insecurity:    Worry: Not on file    Inability: Not on file  . Transportation needs:    Medical: Not on file    Non-medical: Not on file  Tobacco Use  . Smoking status: Never Smoker  . Smokeless tobacco: Never Used  Substance and Sexual Activity  . Alcohol use: No    Alcohol/week: 0.0 standard drinks  . Drug use: No  . Sexual activity: Not on file  Lifestyle  . Physical activity:    Days per week: Not on file    Minutes per session: Not on file  . Stress: Not on file  Relationships  . Social connections:    Talks on phone: Not on file    Gets together: Not on file    Attends religious service: Not on file    Active member of club or organization: Not on file    Attends meetings of clubs or organizations: Not on file    Relationship status: Not on file  Other Topics Concern  . Not on file  Social History Narrative  . Not on file   Outpatient Encounter Medications as of 05/13/2018  Medication Sig  . omeprazole (PRILOSEC) 20 MG capsule Take 1 capsule (20 mg total) by mouth 2 (two) times daily before a meal. For 10 days while on Pylera for H.pylori  . atorvastatin (LIPITOR) 20 MG  tablet TAKE 1 TABLET BY MOUTH  DAILY AT 6 PM. (Patient taking differently: Take 20 mg by mouth daily at 6 PM. )  . Biotin (BIOTIN 5000) 5 MG CAPS Take 5 mg by mouth daily.  . Blood Glucose Monitoring Suppl (ONE TOUCH ULTRA 2) w/Device KIT Use to test blood glucose  . CALCIUM PO Take 2 each by mouth daily.   . Cholecalciferol (VITAMIN D3) 125 MCG (5000 UT) CAPS Take 1 capsule (5,000 Units total) by mouth daily.  Marland Kitchen glucose blood (ONE TOUCH ULTRA TEST) test strip USE TO TEST BLOOD GLUCOSE 4 x daily E11.65  . LANCETS ULTRA THIN MISC Use as directed qid. E11.65 (Patient not taking: Reported on 05/12/2018)  . levothyroxine (SYNTHROID, LEVOTHROID) 125 MCG tablet Take 1 tablet (125 mcg total) by mouth daily.  Marland Kitchen lisinopril (PRINIVIL,ZESTRIL) 10 MG tablet TAKE 1 TABLET BY MOUTH  DAILY (Patient taking differently: Take 10 mg by mouth daily. )  . Multiple Vitamin (MULTIVITAMIN) tablet Take 1 tablet by mouth daily.  . pantoprazole (PROTONIX) 40 MG tablet Take 1 tablet (40 mg total) by mouth daily. (Patient not taking: Reported on 05/13/2018)  . pregabalin (  LYRICA) 75 MG capsule Take 75 mg by mouth daily as needed (for pain).   . [DISCONTINUED] insulin aspart (NOVOLOG FLEXPEN) 100 UNIT/ML FlexPen Inject 20-26 Units into the skin 3 (three) times daily before meals. (Patient taking differently: Inject 17-22 Units into the skin 3 (three) times daily before meals. Per sliding scale)  . [DISCONTINUED] Insulin Degludec (TRESIBA FLEXTOUCH) 200 UNIT/ML SOPN Inject 90 Units into the skin at bedtime. (Patient taking differently: Inject 80 Units into the skin at bedtime. )  . [DISCONTINUED] Insulin Degludec (TRESIBA FLEXTOUCH) 200 UNIT/ML SOPN Inject 100 Units into the skin at bedtime.  . [DISCONTINUED] Insulin Lispro (HUMALOG KWIKPEN) 200 UNIT/ML SOPN Inject 25-31 Units into the skin 3 (three) times daily before meals.  . [DISCONTINUED] insulin lispro (HUMALOG) 100 UNIT/ML KiwkPen Inject 0.2-0.26 mLs (20-26 Units total) into  the skin 3 (three) times daily before meals.   No facility-administered encounter medications on file as of 05/13/2018.    ALLERGIES: Allergies  Allergen Reactions  . Sulfa Antibiotics Rash  . Bactrim [Sulfamethoxazole-Trimethoprim] Itching  . Cherry Other (See Comments)    Unknown  . Gabapentin Other (See Comments)    Unknown  . Invokana [Canagliflozin] Other (See Comments)    Unknown   VACCINATION STATUS:  There is no immunization history on file for this patient.  Diabetes  She presents for her follow-up diabetic visit. She has type 2 diabetes mellitus. Onset time: She was diagnosed at approximate age of 59 years. Her disease course has been improving. There are no hypoglycemic associated symptoms. Pertinent negatives for hypoglycemia include no confusion, headaches, pallor or seizures. Associated symptoms include fatigue, polydipsia and polyuria. Pertinent negatives for diabetes include no chest pain and no polyphagia. There are no hypoglycemic complications. Symptoms are improving. Diabetic complications include peripheral neuropathy and retinopathy. Pertinent negatives for diabetic complications include no autonomic neuropathy. Risk factors for coronary artery disease include diabetes mellitus, dyslipidemia, hypertension, obesity and sedentary lifestyle. She is compliant with treatment most of the time. Her weight is increasing steadily. She is following a generally unhealthy diet. She has not had a previous visit with a dietitian (She missed her appointment.). She never participates in exercise. Her home blood glucose trend is increasing steadily. Her breakfast blood glucose range is generally >200 mg/dl. Her lunch blood glucose range is generally >200 mg/dl. Her dinner blood glucose range is generally >200 mg/dl. Her bedtime blood glucose range is generally >200 mg/dl. Her overall blood glucose range is >200 mg/dl. ( ) An ACE inhibitor/angiotensin II receptor blocker is not being taken.  Eye exam is current.  Hyperlipidemia  This is a chronic problem. The current episode started more than 1 year ago. The problem is uncontrolled. Recent lipid tests were reviewed and are high. Exacerbating diseases include diabetes, hypothyroidism and obesity. Pertinent negatives include no chest pain, myalgias or shortness of breath. She is currently on no antihyperlipidemic treatment.  Thyroid Problem  Presents for initial visit. Symptoms include fatigue. Patient reports no cold intolerance, diarrhea, heat intolerance or palpitations. The following procedures have not been performed: radioiodine uptake scan and thyroidectomy. Her past medical history is significant for diabetes and hyperlipidemia.     Review of Systems  Constitutional: Positive for fatigue. Negative for unexpected weight change.  HENT: Negative for trouble swallowing and voice change.   Eyes: Negative for visual disturbance.  Respiratory: Negative for cough, shortness of breath and wheezing.   Cardiovascular: Negative for chest pain, palpitations and leg swelling.  Gastrointestinal: Negative for diarrhea, nausea and vomiting.  Endocrine: Positive for polydipsia and polyuria. Negative for cold intolerance, heat intolerance and polyphagia.  Musculoskeletal: Negative for arthralgias and myalgias.  Skin: Negative for color change, pallor, rash and wound.  Neurological: Negative for seizures and headaches.  Psychiatric/Behavioral: Negative for confusion and suicidal ideas.    Objective:    BP (!) 163/84   Pulse 78   Ht 5' 7.5" (1.715 m)   Wt (!) 352 lb (159.7 kg)   BMI 54.32 kg/m   Wt Readings from Last 3 Encounters:  05/13/18 (!) 352 lb (159.7 kg)  04/10/18 (!) 348 lb 9.6 oz (158.1 kg)  03/11/18 (!) 353 lb (160.1 kg)    Physical Exam  Constitutional: She is oriented to person, place, and time. She appears well-developed.  She has poor hygiene generally.  HENT:  Head: Normocephalic and atraumatic.  She has poor  dentition.  Eyes: EOM are normal.  Neck: Normal range of motion. Neck supple. No tracheal deviation present. No thyromegaly present.  Cardiovascular: Normal rate.  Pulmonary/Chest: Effort normal.  Abdominal: There is no tenderness. There is no guarding.  Musculoskeletal: Normal range of motion. She exhibits no edema.  Neurological: She is alert and oriented to person, place, and time. No cranial nerve deficit. Coordination normal.  Skin: Skin is warm and dry. No rash noted. No erythema. No pallor.  Psychiatric: She has a normal mood and affect. Judgment normal.  Reluctant , passive affect .    Results for orders placed or performed in visit on 03/11/18  TSH  Result Value Ref Range   TSH 3.61 mIU/L  T4, free  Result Value Ref Range   Free T4 1.6 0.8 - 1.8 ng/dL  Hemoglobin A1c  Result Value Ref Range   Hgb A1c MFr Bld 9.9 (H) <5.7 % of total Hgb   Mean Plasma Glucose 237 (calc)   eAG (mmol/L) 13.2 (calc)  COMPLETE METABOLIC PANEL WITH GFR  Result Value Ref Range   Glucose, Bld 182 (H) 65 - 99 mg/dL   BUN 17 7 - 25 mg/dL   Creat 0.95 0.50 - 1.05 mg/dL   GFR, Est Non African American 70 > OR = 60 mL/min/1.22m   GFR, Est African American 81 > OR = 60 mL/min/1.735m  BUN/Creatinine Ratio NOT APPLICABLE 6 - 22 (calc)   Sodium 139 135 - 146 mmol/L   Potassium 4.7 3.5 - 5.3 mmol/L   Chloride 102 98 - 110 mmol/L   CO2 30 20 - 32 mmol/L   Calcium 9.3 8.6 - 10.4 mg/dL   Total Protein 6.7 6.1 - 8.1 g/dL   Albumin 3.6 3.6 - 5.1 g/dL   Globulin 3.1 1.9 - 3.7 g/dL (calc)   AG Ratio 1.2 1.0 - 2.5 (calc)   Total Bilirubin 0.5 0.2 - 1.2 mg/dL   Alkaline phosphatase (APISO) 108 33 - 130 U/L   AST 15 10 - 35 U/L   ALT 16 6 - 29 U/L   Diabetic Labs (most recent): Lab Results  Component Value Date   HGBA1C 9.9 (H) 05/08/2018   HGBA1C 10.9 (H) 02/02/2018   HGBA1C 9.3 (H) 10/08/2017    Lipid Panel     Component Value Date/Time   CHOL 166 04/09/2017 1159   TRIG 131 04/09/2017 1159    HDL 37 (L) 04/09/2017 1159   CHOLHDL 4.5 04/09/2017 1159   VLDL 42 (H) 04/25/2015 1001   LDLCALC 106 (H) 04/09/2017 1159     Assessment & Plan:   1. Uncontrolled type 2 diabetes mellitus  with complication, with long-term current use of insulin ([facility])   Her diabetes is complicated by neuropathy , noncompliance/nonadherence, and possible retinopathy. - She has currently uncontrolled symptomatic type 2 DM of approximately since age 41 years duration. - She came with her logs showing significantly above target glycemic profile, both fasting and postprandial.  She did not document no report hypoglycemia.    - Her previsit labs show A1c of 9.9% improving from 10.9%.    -She remains at extremely high risk for more acute and chronic complications of diabetes which include CAD, CVA, CKD, retinopathy, and neuropathy. These are all discussed in detail with the patient.  - I have counseled the patient on diet management and weight loss, by adopting a carbohydrate restricted/protein rich diet.  - Patient is advised to stick to a routine mealtimes to eat 3 meals  a day and avoid unnecessary snacks ( to snack only to correct hypoglycemia).   -She admits to dietary indiscretions including consumption of sweets and sweetened beverages. -  Suggestion is made for her to avoid simple carbohydrates  from her diet including Cakes, Sweet Desserts / Pastries, Ice Cream, Soda (diet and regular), Sweet Tea, Candies, Chips, Cookies, Store Bought Juices, Alcohol in Excess of  1-2 drinks a day, Artificial Sweeteners, and "Sugar-free" Products. This will help patient to have stable blood glucose profile and potentially avoid unintended weight gain.  - I encouraged the patient to switch to  unprocessed or minimally processed complex starch and increased protein intake (animal or plant source), fruits, and vegetables.   - I have approached patient with the following individualized plan to manage diabetes and patient  agrees:    -She will continue to require intensive treatment with high-dose basal/bolus insulin in order for her to achieve and maintain control of diabetes to target.  -She is advised to increase Tresiba to 100 units nightly, increase NovoLog to 25 units  (plus correction) 3 times daily AC for pe-meal blood glucose above 90 mg/dL, associated with strict monitoring of blood glucose 4 times a day-before meals and at bedtime.  - She is warned not to take insulin without monitoring blood glucose properly. -She is given individualized chart to correct hyperglycemia above 150 mg/dL. - She will be considered for insulin U500 if she continues to require greater than 200 units of insulin U100 during her next visit.   -She brought in her freestyle libre CGM starter kit, will be helped with the application of the first sensor.  She is advised to wear her sensor at all times.   -Patient is encouraged to call clinic for blood glucose levels less than 70 or above 300 mg /dl. - she is adamant saying that she does not tolerate MTF, Invokana, and [place].  - Patient specific target  A1c;  LDL, HDL, Triglycerides, and  Waist Circumference were discussed in detail.  2) BP/HTN: Her blood pressure is not controlled to target.     She is advised on salt  Restriction.  I will proceed to increase her lisinopril to 20 mg p.o. Daily.  3) Lipids/HPL: LDL  Levels at  106.  She is on Crestor 10 mg p.o. nightly, however with inconsistent intake of this medication.   4) vitamin D deficiency: New diagnosis for her. -I discussed and initiated vitamin D3 5000 units daily for the next 90 days.  5)  Weight/Diet: she keeps missing her appointment with  CDE exercise, and carbohydrates information provided. She   6) hypothyroidism: -She is advised to  continue levothyroxine 125 mcg p.o. daily before breakfast.    - We discussed about correct intake of levothyroxine, at fasting, with water, separated by at least 30 minutes from  breakfast, and separated by more than 4 hours from calcium, iron, multivitamins, acid reflux medications (PPIs). -Patient is made aware of the fact that thyroid hormone replacement is needed for life, dose to be adjusted by periodic monitoring of thyroid function tests.  6) Chronic Care/Health Maintenance:  -Patient isinitiated on  Statin medications. She is encouraged to continue to follow up with Ophthalmology, Podiatrist at least yearly or according to recommendations, and advised to stay away from smoking. I have recommended yearly flu vaccine and pneumonia vaccination at least every 5 years; moderate intensity exercise for up to 150 minutes weekly; and  sleep for at least 7 hours a day.  I advised patient to maintain close follow up with their PCP for primary care needs. - Time spent with the patient: 25 min, of which >50% was spent in reviewing her blood glucose logs , discussing her hypo- and hyper-glycemic episodes, reviewing her current and  previous labs and insulin doses and developing a plan to avoid hypo- and hyper-glycemia. Please refer to Patient Instructions for Blood Glucose Monitoring and Insulin/Medications Dosing Guide"  in media tab for additional information. Michelle Skinner participated in the discussions, expressed understanding, and voiced agreement with the above plans.  All questions were answered to her satisfaction. she is encouraged to contact clinic should she have any questions or concerns prior to her return visit.  Follow up plan: Return in about 3 months (around 08/13/2018) for Meter, and Logs.  Glade Lloyd, MD Phone: [phone]  Fax: [phone]  -  This note was partially dictated with voice recognition software. Similar sounding words can be transcribed inadequately or may not  be corrected upon review.  05/13/2018, 12:56 PM

## 2018-05-13 NOTE — Patient Instructions (Signed)
Michelle Skinner  05/13/2018     @PREFPERIOPPHARMACY @   Your procedure is scheduled on  05/25/2018 .  Report to [facility] at  615   A.M.  Call this number if you have problems the morning of surgery:  [phone]   Remember:  Follow the diet and prep instructions given to you by Dr Roseanne Kaufman office.                      Take these medicines the morning of surgery with A SIP OF WATER  Levothyroxine, lisinopril, lyrica. Take 1/2 of your Novolog insulin the night before your procedure( 40 units). DO NOT take any medications for diabetes the morning of your procedure.    Do not wear jewelry, make-up or nail polish.  Do not wear lotions, powders, or perfumes, or deodorant.  Do not shave 48 hours prior to surgery.  Men may shave face and neck.  Do not bring valuables to the hospital.  [facility] is not responsible for any belongings or valuables.  Contacts, dentures or bridgework may not be worn into surgery.  Leave your suitcase in the car.  After surgery it may be brought to your room.  For patients admitted to the hospital, discharge time will be determined by your treatment team.  Patients discharged the day of surgery will not be allowed to drive home.   Name and phone number of your driver:   family Special instructions:  None  Please read over the following fact sheets that you were given. Anesthesia Post-op Instructions and Care and Recovery After Surgery       Colonoscopy, Adult A colonoscopy is an exam to look at the large intestine. It is done to check for problems, such as:  Lumps (tumors).  Growths (polyps).  Swelling (inflammation).  Bleeding.  What happens before the procedure? Eating and drinking Follow instructions from your doctor about eating and drinking. These instructions may include:  A few days before the procedure - follow a low-fiber diet. ? Avoid nuts. ? Avoid seeds. ? Avoid dried fruit. ? Avoid raw fruits. ? Avoid  vegetables.  1-3 days before the procedure - follow a clear liquid diet. Avoid liquids that have red or purple dye. Drink only clear liquids, such as: ? Clear broth or bouillon. ? Black coffee or tea. ? Clear juice. ? Clear soft drinks or sports drinks. ? Gelatin dessert. ? Popsicles.  On the day of the procedure - do not eat or drink anything during the 2 hours before the procedure.  Bowel prep If you were prescribed an oral bowel prep:  Take it as told by your doctor. Starting the day before your procedure, you will need to drink a lot of liquid. The liquid will cause you to poop (have bowel movements) until your poop is almost clear or light green.  If your skin or butt gets irritated from diarrhea, you may: ? Wipe the area with wipes that have medicine in them, such as adult wet wipes with aloe and vitamin E. ? Put something on your skin that soothes the area, such as petroleum jelly.  If you throw up (vomit) while drinking the bowel prep, take a break for up to 60 minutes. Then begin the bowel prep again. If you keep throwing up and you cannot take the bowel prep without throwing up, call your doctor.  General instructions  Ask your doctor about changing  or stopping your normal medicines. This is important if you take diabetes medicines or blood thinners.  Plan to have someone take you home from the hospital or clinic. What happens during the procedure?  An IV tube may be put into one of your veins.  You will be given medicine to help you relax (sedative).  To reduce your risk of infection: ? Your doctors will wash their hands. ? Your anal area will be washed with soap.  You will be asked to lie on your side with your knees bent.  Your doctor will get a long, thin, flexible tube ready. The tube will have a camera and a light on the end.  The tube will be put into your anus.  The tube will be gently put into your large intestine.  Air will be delivered into your  large intestine to keep it open. You may feel some pressure or cramping.  The camera will be used to take photos.  A small tissue sample may be removed from your body to be looked at under a microscope (biopsy). If any possible problems are found, the tissue will be sent to a lab for testing.  If small growths are found, your doctor may remove them and have them checked for cancer.  The tube that was put into your anus will be slowly removed. The procedure may vary among doctors and hospitals. What happens after the procedure?  Your doctor will check on you often until the medicines you were given have worn off.  Do not drive for 24 hours after the procedure.  You may have a small amount of blood in your poop.  You may pass gas.  You may have mild cramps or bloating in your belly (abdomen).  It is up to you to get the results of your procedure. Ask your doctor, or the department performing the procedure, when your results will be ready. This information is not intended to replace advice given to you by your health care provider. Make sure you discuss any questions you have with your health care provider. Document Released: 07/27/2010 Document Revised: 04/24/2016 Document Reviewed: 09/05/2015 Elsevier Interactive Patient Education  2017 [employer]  Colonoscopy, Adult, Care After This sheet gives you information about how to care for yourself after your procedure. Your health care provider may also give you more specific instructions. If you have problems or questions, contact your health care provider. What can I expect after the procedure? After the procedure, it is common to have:  A small amount of blood in your stool for 24 hours after the procedure.  Some gas.  Mild abdominal cramping or bloating.  Follow these instructions at home: General instructions   For the first 24 hours after the procedure: ? Do not drive or use machinery. ? Do not sign important  documents. ? Do not drink alcohol. ? Do your regular daily activities at a slower pace than normal. ? Eat soft, easy-to-digest foods. ? Rest often.  Take over-the-counter or prescription medicines only as told by your health care provider.  It is up to you to get the results of your procedure. Ask your health care provider, or the department performing the procedure, when your results will be ready. Relieving cramping and bloating  Try walking around when you have cramps or feel bloated.  Apply heat to your abdomen as told by your health care provider. Use a heat source that your health care provider recommends, such as a moist heat pack  or a heating pad. ? Place a towel between your skin and the heat source. ? Leave the heat on for 20-30 minutes. ? Remove the heat if your skin turns bright red. This is especially important if you are unable to feel pain, heat, or cold. You may have a greater risk of getting burned. Eating and drinking  Drink enough fluid to keep your urine clear or pale yellow.  Resume your normal diet as instructed by your health care provider. Avoid heavy or fried foods that are hard to digest.  Avoid drinking alcohol for as long as instructed by your health care provider. Contact a health care provider if:  You have blood in your stool 2-3 days after the procedure. Get help right away if:  You have more than a small spotting of blood in your stool.  You pass large blood clots in your stool.  Your abdomen is swollen.  You have nausea or vomiting.  You have a fever.  You have increasing abdominal pain that is not relieved with medicine. This information is not intended to replace advice given to you by your health care provider. Make sure you discuss any questions you have with your health care provider. Document Released: 02/06/2004 Document Revised: 03/18/2016 Document Reviewed: 09/05/2015 Elsevier Interactive Patient Education  2018 [place] Anesthesia is a term that refers to techniques, procedures, and medicines that help a person stay safe and comfortable during a medical procedure. Monitored anesthesia care, or sedation, is one type of anesthesia. Your anesthesia specialist may recommend sedation if you will be having a procedure that does not require you to be unconscious, such as:  Cataract surgery.  A dental procedure.  A biopsy.  A colonoscopy.  During the procedure, you may receive a medicine to help you relax (sedative). There are three levels of sedation:  Mild sedation. At this level, you may feel awake and relaxed. You will be able to follow directions.  Moderate sedation. At this level, you will be sleepy. You may not remember the procedure.  Deep sedation. At this level, you will be asleep. You will not remember the procedure.  The more medicine you are given, the deeper your level of sedation will be. Depending on how you respond to the procedure, the anesthesia specialist may change your level of sedation or the type of anesthesia to fit your needs. An anesthesia specialist will monitor you closely during the procedure. Let your health care provider know about:  Any allergies you have.  All medicines you are taking, including vitamins, herbs, eye drops, creams, and over-the-counter medicines.  Any use of steroids (by mouth or as a cream).  Any problems you or family members have had with sedatives and anesthetic medicines.  Any blood disorders you have.  Any surgeries you have had.  Any medical conditions you have, such as sleep apnea.  Whether you are pregnant or may be pregnant.  Any use of cigarettes, alcohol, or street drugs. What are the risks? Generally, this is a safe procedure. However, problems may occur, including:  Getting too much medicine (oversedation).  Nausea.  Allergic reaction to medicines.  Trouble breathing. If this happens, a breathing  tube may be used to help with breathing. It will be removed when you are awake and breathing on your own.  Heart trouble.  Lung trouble.  Before the procedure Staying hydrated Follow instructions from your health care provider about hydration, which may include:  Up to 2  hours before the procedure - you may continue to drink clear liquids, such as water, clear fruit juice, black coffee, and plain tea.  Eating and drinking restrictions Follow instructions from your health care provider about eating and drinking, which may include:  8 hours before the procedure - stop eating heavy meals or foods such as meat, fried foods, or fatty foods.  6 hours before the procedure - stop eating light meals or foods, such as toast or cereal.  6 hours before the procedure - stop drinking milk or drinks that contain milk.  2 hours before the procedure - stop drinking clear liquids.  Medicines Ask your health care provider about:  Changing or stopping your regular medicines. This is especially important if you are taking diabetes medicines or blood thinners.  Taking medicines such as aspirin and ibuprofen. These medicines can thin your blood. Do not take these medicines before your procedure if your health care provider instructs you not to.  Tests and exams  You will have a physical exam.  You may have blood tests done to show: ? How well your kidneys and liver are working. ? How well your blood can clot.  General instructions  Plan to have someone take you home from the hospital or clinic.  If you will be going home right after the procedure, plan to have someone with you for 24 hours.  What happens during the procedure?  Your blood pressure, heart rate, breathing, level of pain and overall condition will be monitored.  An IV tube will be inserted into one of your veins.  Your anesthesia specialist will give you medicines as needed to keep you comfortable during the procedure. This  may mean changing the level of sedation.  The procedure will be performed. After the procedure  Your blood pressure, heart rate, breathing rate, and blood oxygen level will be monitored until the medicines you were given have worn off.  Do not drive for 24 hours if you received a sedative.  You may: ? Feel sleepy, clumsy, or nauseous. ? Feel forgetful about what happened after the procedure. ? Have a sore throat if you had a breathing tube during the procedure. ? Vomit. This information is not intended to replace advice given to you by your health care provider. Make sure you discuss any questions you have with your health care provider. Document Released: 03/20/2005 Document Revised: 12/01/2015 Document Reviewed: 10/15/2015 Elsevier Interactive Patient Education  2018 [place], Care After These instructions provide you with information about caring for yourself after your procedure. Your health care provider may also give you more specific instructions. Your treatment has been planned according to current medical practices, but problems sometimes occur. Call your health care provider if you have any problems or questions after your procedure. What can I expect after the procedure? After your procedure, it is common to:  Feel sleepy for several hours.  Feel clumsy and have poor balance for several hours.  Feel forgetful about what happened after the procedure.  Have poor judgment for several hours.  Feel nauseous or vomit.  Have a sore throat if you had a breathing tube during the procedure.  Follow these instructions at home: For at least 24 hours after the procedure:   Do not: ? Participate in activities in which you could fall or become injured. ? Drive. ? Use heavy machinery. ? Drink alcohol. ? Take sleeping pills or medicines that cause drowsiness. ? Make important decisions or sign  legal documents. ? Take care of children on your  own.  Rest. Eating and drinking  Follow the diet that is recommended by your health care provider.  If you vomit, drink water, juice, or soup when you can drink without vomiting.  Make sure you have little or no nausea before eating solid foods. General instructions  Have a responsible adult stay with you until you are awake and alert.  Take over-the-counter and prescription medicines only as told by your health care provider.  If you smoke, do not smoke without supervision.  Keep all follow-up visits as told by your health care provider. This is important. Contact a health care provider if:  You keep feeling nauseous or you keep vomiting.  You feel light-headed.  You develop a rash.  You have a fever. Get help right away if:  You have trouble breathing. This information is not intended to replace advice given to you by your health care provider. Make sure you discuss any questions you have with your health care provider. Document Released: 10/15/2015 Document Revised: 02/14/2016 Document Reviewed: 10/15/2015 Elsevier Interactive Patient Education  [employer].

## 2018-05-14 ENCOUNTER — Other Ambulatory Visit: Payer: Self-pay

## 2018-05-14 ENCOUNTER — Telehealth: Payer: Self-pay | Admitting: "Endocrinology

## 2018-05-14 LAB — HELICOBACTER PYLORI  SPECIAL ANTIGEN
MICRO NUMBER:: 91338596
SPECIMEN QUALITY: ADEQUATE

## 2018-05-14 MED ORDER — LEVOTHYROXINE SODIUM 125 MCG PO TABS: 125.0000 ug | ORAL_TABLET | Freq: Every day | ORAL | 0 refills | Status: DC

## 2018-05-14 MED ORDER — LISINOPRIL 20 MG PO TABS: 20.0000 mg | ORAL_TABLET | Freq: Every day | ORAL | 0 refills | Status: DC

## 2018-05-14 MED ORDER — PANTOPRAZOLE SODIUM 40 MG PO TBEC: 40.0000 mg | DELAYED_RELEASE_TABLET | Freq: Every day | ORAL | 0 refills | Status: DC

## 2018-05-14 MED ORDER — ATORVASTATIN CALCIUM 20 MG PO TABS: ORAL_TABLET | ORAL | 0 refills | Status: DC

## 2018-05-14 NOTE — Telephone Encounter (Signed)
Michelle Skinner states that she is also needing refills on levothyroxine (SYNTHROID, LEVOTHROID) 125 MCG tablet, pantoprazole (PROTONIX) 40 MG tablet , atorvastatin (LIPITOR) 20 MG tablet

## 2018-05-15 NOTE — Telephone Encounter (Signed)
rx sent

## 2018-05-18 ENCOUNTER — Encounter (HOSPITAL_COMMUNITY)
Admission: RE | Admit: 2018-05-18 | Discharge: 2018-05-18 | Disposition: A | Discharge status: Home / Self Care | Payer: Medicare Other | Source: Ambulatory Visit | Attending: Internal Medicine | Admitting: Internal Medicine

## 2018-05-18 ENCOUNTER — Other Ambulatory Visit: Payer: Self-pay

## 2018-05-18 ENCOUNTER — Encounter (HOSPITAL_COMMUNITY): Payer: Self-pay

## 2018-05-18 DIAGNOSIS — Z01818 Encounter for other preprocedural examination: Secondary | ICD-10-CM | POA: Diagnosis present

## 2018-05-18 DIAGNOSIS — R9431 Abnormal electrocardiogram [ECG] [EKG]: Secondary | ICD-10-CM | POA: Diagnosis not present

## 2018-05-18 DIAGNOSIS — R000 Tachycardia, unspecified: Secondary | ICD-10-CM | POA: Diagnosis not present

## 2018-05-18 HISTORY — DX: Polyneuropathy, unspecified: G62.9

## 2018-05-18 HISTORY — DX: Anemia, unspecified: D64.9

## 2018-05-18 LAB — CBC WITH DIFFERENTIAL/PLATELET
Abs Immature Granulocytes: 0.03 10*3/uL (ref 0.00–0.07)
BASOS ABS: 0 10*3/uL (ref 0.0–0.1)
BASOS PCT: 0 %
EOS ABS: 0 10*3/uL (ref 0.0–0.5)
EOS PCT: 0 %
HCT: 38.4 % (ref 36.0–46.0)
Hemoglobin: 11.5 g/dL — ABNORMAL LOW (ref 12.0–15.0)
IMMATURE GRANULOCYTES: 0 %
LYMPHS ABS: 1.4 10*3/uL (ref 0.7–4.0)
Lymphocytes Relative: 15 %
MCH: 23.7 pg — ABNORMAL LOW (ref 26.0–34.0)
MCHC: 29.9 g/dL — AB (ref 30.0–36.0)
MCV: 79.2 fL — AB (ref 80.0–100.0)
MONO ABS: 0.6 10*3/uL (ref 0.1–1.0)
MONOS PCT: 6 %
NEUTROS ABS: 7.5 10*3/uL (ref 1.7–7.7)
NEUTROS PCT: 79 %
PLATELETS: 411 10*3/uL — AB (ref 150–400)
RBC: 4.85 MIL/uL (ref 3.87–5.11)
RDW: 14.6 % (ref 11.5–15.5)
WBC: 9.6 10*3/uL (ref 4.0–10.5)
nRBC: 0 % (ref 0.0–0.2)

## 2018-05-19 NOTE — Progress Notes (Signed)
CC'D TO PCP °

## 2018-05-25 ENCOUNTER — Ambulatory Visit (HOSPITAL_COMMUNITY): Payer: Medicare Other | Admitting: Anesthesiology

## 2018-05-25 ENCOUNTER — Ambulatory Visit (HOSPITAL_COMMUNITY)
Admission: RE | Admit: 2018-05-25 | Discharge: 2018-05-25 | Disposition: A | Discharge status: Home / Self Care | Payer: Medicare Other | Source: Ambulatory Visit | Attending: Internal Medicine | Admitting: Internal Medicine

## 2018-05-25 ENCOUNTER — Encounter (HOSPITAL_COMMUNITY): Payer: Self-pay | Admitting: *Deleted

## 2018-05-25 ENCOUNTER — Encounter (HOSPITAL_COMMUNITY)
Admission: RE | Disposition: A | Discharge status: Home / Self Care | Payer: Self-pay | Source: Ambulatory Visit | Attending: Internal Medicine

## 2018-05-25 DIAGNOSIS — E785 Hyperlipidemia, unspecified: Secondary | ICD-10-CM | POA: Insufficient documentation

## 2018-05-25 DIAGNOSIS — D125 Benign neoplasm of sigmoid colon: Secondary | ICD-10-CM

## 2018-05-25 DIAGNOSIS — F4310 Post-traumatic stress disorder, unspecified: Secondary | ICD-10-CM | POA: Insufficient documentation

## 2018-05-25 DIAGNOSIS — K219 Gastro-esophageal reflux disease without esophagitis: Secondary | ICD-10-CM | POA: Insufficient documentation

## 2018-05-25 DIAGNOSIS — K5730 Diverticulosis of large intestine without perforation or abscess without bleeding: Secondary | ICD-10-CM

## 2018-05-25 DIAGNOSIS — Z1211 Encounter for screening for malignant neoplasm of colon: Secondary | ICD-10-CM

## 2018-05-25 DIAGNOSIS — K642 Third degree hemorrhoids: Secondary | ICD-10-CM | POA: Insufficient documentation

## 2018-05-25 DIAGNOSIS — I10 Essential (primary) hypertension: Secondary | ICD-10-CM | POA: Insufficient documentation

## 2018-05-25 DIAGNOSIS — Z794 Long term (current) use of insulin: Secondary | ICD-10-CM | POA: Diagnosis not present

## 2018-05-25 DIAGNOSIS — K644 Residual hemorrhoidal skin tags: Secondary | ICD-10-CM | POA: Insufficient documentation

## 2018-05-25 DIAGNOSIS — F329 Major depressive disorder, single episode, unspecified: Secondary | ICD-10-CM | POA: Insufficient documentation

## 2018-05-25 DIAGNOSIS — E1140 Type 2 diabetes mellitus with diabetic neuropathy, unspecified: Secondary | ICD-10-CM | POA: Diagnosis not present

## 2018-05-25 HISTORY — PX: COLONOSCOPY WITH PROPOFOL: SHX5780

## 2018-05-25 HISTORY — PX: POLYPECTOMY: SHX5525

## 2018-05-25 LAB — GLUCOSE, CAPILLARY
GLUCOSE-CAPILLARY: 147 mg/dL — AB (ref 70–99)
Glucose-Capillary: 143 mg/dL — ABNORMAL HIGH (ref 70–99)

## 2018-05-25 SURGERY — COLONOSCOPY WITH PROPOFOL
Anesthesia: General

## 2018-05-25 MED ORDER — LACTATED RINGERS IV SOLN
INTRAVENOUS | Status: DC
  Administered 2018-05-25: 08:00:00 via INTRAVENOUS

## 2018-05-25 MED ORDER — PROPOFOL 500 MG/50ML IV EMUL
INTRAVENOUS | Status: DC | PRN
  Administered 2018-05-25: 150 ug/kg/min via INTRAVENOUS

## 2018-05-25 MED ORDER — PROPOFOL 10 MG/ML IV BOLUS
INTRAVENOUS | Status: AC
  Filled 2018-05-25: qty 60

## 2018-05-25 MED ORDER — MIDAZOLAM HCL 2 MG/2ML IJ SOLN: 0.5000 mg | Freq: Once | INTRAMUSCULAR | Status: DC | PRN

## 2018-05-25 MED ORDER — HYDROCODONE-ACETAMINOPHEN 7.5-325 MG PO TABS: 1.0000 | ORAL_TABLET | Freq: Once | ORAL | Status: DC | PRN

## 2018-05-25 MED ORDER — PROPOFOL 10 MG/ML IV BOLUS
INTRAVENOUS | Status: DC | PRN
  Administered 2018-05-25: 30 mg via INTRAVENOUS

## 2018-05-25 MED ORDER — KETAMINE HCL 50 MG/5ML IJ SOSY
PREFILLED_SYRINGE | INTRAMUSCULAR | Status: AC
  Filled 2018-05-25: qty 5

## 2018-05-25 MED ORDER — CHLORHEXIDINE GLUCONATE CLOTH 2 % EX PADS: 6.0000 | MEDICATED_PAD | Freq: Once | CUTANEOUS | Status: DC

## 2018-05-25 MED ORDER — PROMETHAZINE HCL 25 MG/ML IJ SOLN: 6.2500 mg | INTRAMUSCULAR | Status: DC | PRN

## 2018-05-25 MED ORDER — KETAMINE HCL 10 MG/ML IJ SOLN
INTRAMUSCULAR | Status: DC | PRN
  Administered 2018-05-25: 10 mg via INTRAVENOUS

## 2018-05-25 MED ORDER — HYDROMORPHONE HCL 1 MG/ML IJ SOLN: 0.2500 mg | INTRAMUSCULAR | Status: DC | PRN

## 2018-05-25 NOTE — Anesthesia Preprocedure Evaluation (Signed)
Anesthesia Evaluation  Patient identified by MRN, date of birth, ID band Patient awake    Reviewed: Allergy & Precautions, NPO status , Patient's Chart, lab work & pertinent test results  Airway Mallampati: II  TM Distance: >3 FB Neck ROM: Full    Dental no notable dental hx. (+) Poor Dentition   Pulmonary neg pulmonary ROS,    Pulmonary exam normal breath sounds clear to auscultation       Cardiovascular Exercise Tolerance: Poor hypertension, Pt. on medications Normal cardiovascular examII Rhythm:Regular Rate:Normal     Neuro/Psych Anxiety Depression negative neurological ROS  negative psych ROS   GI/Hepatic Neg liver ROS, GERD  Medicated and Controlled,  Endo/Other  diabetes, Type 1, Insulin Dependent, Oral Hypoglycemic AgentsHypothyroidism   Renal/GU negative Renal ROS  negative genitourinary   Musculoskeletal negative musculoskeletal ROS (+)   Abdominal   Peds negative pediatric ROS (+)  Hematology negative hematology ROS (+) anemia ,   Anesthesia Other Findings   Reproductive/Obstetrics negative OB ROS                             Anesthesia Physical Anesthesia Plan  ASA: IV  Anesthesia Plan: General   Post-op Pain Management:    Induction: Intravenous  PONV Risk Score and Plan:   Airway Management Planned: Nasal Cannula  Additional Equipment:   Intra-op Plan:   Post-operative Plan:   Informed Consent: I have reviewed the patients History and Physical, chart, labs and discussed the procedure including the risks, benefits and alternatives for the proposed anesthesia with the patient or authorized representative who has indicated his/her understanding and acceptance.   Dental advisory given  Plan Discussed with: CRNA  Anesthesia Plan Comments:         Anesthesia Quick Evaluation

## 2018-05-25 NOTE — Anesthesia Procedure Notes (Signed)
Procedure Name: General with mask airway Date/Time: 05/25/2018 7:35 AM Performed by: Andree Elk Amy A, CRNA Pre-anesthesia Checklist: Patient identified, Emergency Drugs available, Suction available, Patient being monitored and Timeout performed Oxygen Delivery Method: Simple face mask

## 2018-05-25 NOTE — H&P (Signed)
@LOGO@   Primary Care Physician:  Vyas, Dhruv B, MD Primary Gastroenterologist:  Dr. Rourk  Pre-Procedure History & Physical: HPI:  Michelle Skinner is a 51 y.o. female is here for a screening colonoscopy.  In ED last week 24 hours with nausea and vomiting-resolved.  Past Medical History:  Diagnosis Date  . Anemia   . Cervical cancer (HCC)   . Depression   . Diabetes mellitus, type II (HCC)   . GERD (gastroesophageal reflux disease)   . Hyperlipidemia   . Neuropathy   . PTSD (post-traumatic stress disorder)     Past Surgical History:  Procedure Laterality Date  . ABDOMINAL HYSTERECTOMY    . OTHER SURGICAL HISTORY Right    R Foot- I&D  . UMBILICAL HERNIA REPAIR  2007    Prior to Admission medications   Medication Sig Start Date End Date Taking? Authorizing Provider  atorvastatin (LIPITOR) 20 MG tablet TAKE 1 TABLET BY MOUTH  DAILY AT 6 PM. 05/14/18  Yes [name], Gebreselassie W, MD  Biotin (BIOTIN 5000) 5 MG CAPS Take 5 mg by mouth daily.   Yes [provider]  CALCIUM PO Take 2 each by mouth daily.    Yes [provider]  Cholecalciferol (VITAMIN D3) 125 MCG (5000 UT) CAPS Take 1 capsule (5,000 Units total) by mouth daily. 05/13/18  Yes [name], Gebreselassie W, MD  Insulin Degludec (TRESIBA FLEXTOUCH) 200 UNIT/ML SOPN Inject 100 Units into the skin at bedtime. 05/13/18  Yes [name], Gebreselassie W, MD  Insulin Lispro (HUMALOG KWIKPEN) 200 UNIT/ML SOPN Inject 25-31 Units into the skin 3 (three) times daily before meals. 05/13/18  Yes [name], Gebreselassie W, MD  levothyroxine (SYNTHROID, LEVOTHROID) 125 MCG tablet Take 1 tablet (125 mcg total) by mouth daily. 05/14/18  Yes [name], Gebreselassie W, MD  lisinopril (PRINIVIL,ZESTRIL) 20 MG tablet Take 1 tablet (20 mg total) by mouth daily. 05/14/18  Yes [name], Gebreselassie W, MD  Multiple Vitamin (MULTIVITAMIN) tablet Take 1 tablet by mouth daily.   Yes [provider]  pantoprazole (PROTONIX) 40 MG tablet Take 1 tablet  (40 mg total) by mouth daily. 05/14/18  Yes [name], Gebreselassie W, MD  pregabalin (LYRICA) 75 MG capsule Take 75 mg by mouth daily as needed (for pain).    Yes [provider]  Blood Glucose Monitoring Suppl (ONE TOUCH ULTRA 2) w/Device KIT Use to test blood glucose 04/17/17   [name], Gebreselassie W, MD  glucose blood (ONE TOUCH ULTRA TEST) test strip USE TO TEST BLOOD GLUCOSE 4 x daily E11.65 07/18/17   [name], Gebreselassie W, MD  LANCETS ULTRA THIN MISC Use as directed qid. E11.65 Patient not taking: Reported on 05/12/2018 01/28/17   [name], Gebreselassie W, MD  omeprazole (PRILOSEC) 20 MG capsule Take 1 capsule (20 mg total) by mouth 2 (two) times daily before a meal. For 10 days while on Pylera for H.pylori 03/05/18   Lewis, Leslie S, PA-C    Allergies as of 04/10/2018 - Review Complete 04/10/2018  Allergen Reaction Noted  . Bactrim [sulfamethoxazole-trimethoprim]  04/25/2015  . Cherry  10/20/2017  . Gabapentin  04/25/2015  . Invokana [canagliflozin]  04/25/2015    Family History  Problem Relation Age of Onset  . Hypertension Mother   . Diabetes Father   . Hyperlipidemia Father   . CAD Father   . Stroke Father   . Osteoporosis Maternal Grandmother   . Cancer Maternal Grandmother   . Hypertension Maternal Grandmother   . Colon cancer Neg Hx       Social History   Socioeconomic History  . Marital status: Married    Spouse name: Not on file  . Number of children: Not on file  . Years of education: Not on file  . Highest education level: Not on file  Occupational History  . Not on file  Social Needs  . Financial resource strain: Not on file  . Food insecurity:    Worry: Not on file    Inability: Not on file  . Transportation needs:    Medical: Not on file    Non-medical: Not on file  Tobacco Use  . Smoking status: Never Smoker  . Smokeless tobacco: Never Used  Substance and Sexual Activity  . Alcohol use: No    Alcohol/week: 0.0 standard drinks  . Drug use: No  .  Sexual activity: Yes    Birth control/protection: Surgical  Lifestyle  . Physical activity:    Days per week: Not on file    Minutes per session: Not on file  . Stress: Not on file  Relationships  . Social connections:    Talks on phone: Not on file    Gets together: Not on file    Attends religious service: Not on file    Active member of club or organization: Not on file    Attends meetings of clubs or organizations: Not on file    Relationship status: Not on file  . Intimate partner violence:    Fear of current or ex partner: Not on file    Emotionally abused: Not on file    Physically abused: Not on file    Forced sexual activity: Not on file  Other Topics Concern  . Not on file  Social History Narrative  . Not on file    Review of Systems: See HPI, otherwise negative ROS  Physical Exam: BP (!) 161/70   Pulse 67   Temp 98.3 F (36.8 C) (Oral)   Resp 20   SpO2 100%  General:   Alert,  Well-developed, well-nourished, pleasant and cooperative in NAD hi. No acute distress. Heart:  Regular rate and rhythm; no murmurs, clicks, rubs,  or gallops. Abdomen:  Soft, nontender and nondistended. No masses, hepatosplenomegaly or hernias noted. Normal bowel sounds, without guarding, and without rebound.    Impression/Plan: Michelle Skinner is now here to undergo a screening colonoscopy.  First ever average risk screening examination  Risks, benefits, limitations, imponderables and alternatives regarding colonoscopy have been reviewed with the patient. Questions have been answered. All parties agreeable.     Notice:  This dictation was prepared with Dragon dictation along with smaller phrase technology. Any transcriptional errors that result from this process are unintentional and may not be corrected upon review.  

## 2018-05-25 NOTE — Addendum Note (Signed)
Addendum  created 05/25/18 1326 by Mickel Baas, CRNA   Intraprocedure Staff edited

## 2018-05-25 NOTE — Transfer of Care (Signed)
Immediate Anesthesia Transfer of Care Note  Patient: Michelle Skinner  Procedure(s) Performed: COLONOSCOPY WITH PROPOFOL (N/A ) POLYPECTOMY  Patient Location: PACU  Anesthesia Type:General  Level of Consciousness: awake, alert , oriented and patient cooperative  Airway & Oxygen Therapy: Patient Spontanous Breathing  Post-op Assessment: Report given to RN and Post -op Vital signs reviewed and stable  Post vital signs: Reviewed and stable  Last Vitals:  Vitals Value Taken Time  BP    Temp    Pulse    Resp    SpO2      Last Pain:  Vitals:   05/25/18 0740  TempSrc:   PainSc: 0-No pain      Patients Stated Pain Goal: 5 (08/28/77 8102)  Complications: No apparent anesthesia complications

## 2018-05-25 NOTE — Op Note (Signed)
[facility] Patient Name: Michelle Skinner Procedure Date: 05/25/2018 7:13 AM MRN: [id number] Date of Birth: [birth year] Attending MD: Norvel Richards , MD CSN: [id number] Age: 52 Admit Type: Outpatient Procedure:                Colonoscopy Indications:              Screening for colorectal malignant neoplasm Providers:                Norvel Richards, MD, Jeanann Lewandowsky. Sharon Seller, RN,                            Randa Spike, Technician Referring MD:              Medicines:                Propofol per Anesthesia Complications:            No immediate complications. Estimated Blood Loss:      Procedure:                Pre-Anesthesia Assessment:                           - Prior to the procedure, a History and Physical                            was performed, and patient medications and                            allergies were reviewed. The patient's tolerance of                            previous anesthesia was also reviewed. The risks                            and benefits of the procedure and the sedation                            options and risks were discussed with the patient.                            All questions were answered, and informed consent                            was obtained. Prior Anticoagulants: The patient has                            taken no previous anticoagulant or antiplatelet                            agents. ASA Grade Assessment: III - A patient with                            severe systemic disease. After reviewing the risks  and benefits, the patient was deemed in                            satisfactory condition to undergo the procedure.                           After obtaining informed consent, the colonoscope                            was passed under direct vision. Throughout the                            procedure, the patient's blood pressure, pulse, and                            oxygen saturations  were monitored continuously. The                            CF-HQ190L ([id number]) scope was introduced through                            the and advanced to the the cecum, identified by                            appendiceal orifice and ileocecal valve. The                            colonoscopy was performed without difficulty. The                            patient tolerated the procedure well. The quality                            of the bowel preparation was adequate. The                            ileocecal valve, appendiceal orifice, and rectum                            were photographed. The entire colon was well                            visualized. Scope In: 7:45:31 AM Scope Out: 7:57:47 AM Scope Withdrawal Time: 0 hours 8 minutes 44 seconds  Total Procedure Duration: 0 hours 12 minutes 16 seconds  Findings:      Hemorrhoids were found on perianal exam.      External and internal hemorrhoids were found during retroflexion. The       hemorrhoids were moderate, medium-sized and Grade III (internal       hemorrhoids that prolapse but require manual reduction).      A 10 mm polyp was found in the sigmoid colon. The polyp was       semi-pedunculated. The polyp was removed with a hot snare. Resection and       retrieval were complete. Estimated blood loss:  none.      Scattered medium-mouthed diverticula were found in the entire colon.      The exam was otherwise without abnormality on direct and retroflexion       views. Impression:               - Hemorrhoids found on perianal exam.                           - External and internal hemorrhoids.                           - One 10 mm polyp in the sigmoid colon, removed                            with a hot snare. Resected and retrieved.                           - Diverticulosis in the entire examined colon.                           - The examination was otherwise normal on direct                            and retroflexion  views. Moderate Sedation:      Moderate (conscious) sedation was personally administered by an       anesthesia professional. The following parameters were monitored: oxygen       saturation, heart rate, blood pressure, respiratory rate, EKG, adequacy       of pulmonary ventilation, and response to care. Recommendation:           - Patient has a contact number available for                            emergencies. The signs and symptoms of potential                            delayed complications were discussed with the                            patient. Return to normal activities tomorrow.                            Written discharge instructions were provided to the                            patient.                           - Resume previous diet.                           - Continue present medications.                           - Repeat colonoscopy date to be determined after  pending pathology results are reviewed for                            surveillance based on pathology results.                           - Return to GI office (date not yet determined). Procedure Code(s):        --- Professional ---                           [phone], Colonoscopy, flexible; with removal of                            tumor(s), polyp(s), or other lesion(s) by snare                            technique Diagnosis Code(s):        --- Professional ---                           Z12.11, Encounter for screening for malignant                            neoplasm of colon                           K64.2, Third degree hemorrhoids                           D12.5, Benign neoplasm of sigmoid colon                           K57.30, Diverticulosis of large intestine without                            perforation or abscess without bleeding CPT copyright 2018 American [employer] Association. All rights reserved. The codes documented in this report are preliminary and upon coder review  may  be revised to meet current compliance requirements. Cristopher Estimable. Kiylee Thoreson, MD Norvel Richards, MD 05/25/2018 8:06:27 AM This report has been signed electronically. Number of Addenda: 0

## 2018-05-25 NOTE — Discharge Instructions (Signed)
Colonoscopy Discharge Instructions  Read the instructions outlined below and refer to this sheet in the next few weeks. These discharge instructions provide you with general information on caring for yourself after you leave the hospital. Your doctor may also give you specific instructions. While your treatment has been planned according to the most current medical practices available, unavoidable complications occasionally occur. If you have any problems or questions after discharge, call Dr. Gala Romney at [phone]. ACTIVITY  You may resume your regular activity, but move at a slower pace for the next 24 hours.   Take frequent rest periods for the next 24 hours.   Walking will help get rid of the air and reduce the bloated feeling in your belly (abdomen).   No driving for 24 hours (because of the medicine (anesthesia) used during the test).    Do not sign any important legal documents or operate any machinery for 24 hours (because of the anesthesia used during the test).  NUTRITION  Drink plenty of fluids.   You may resume your normal diet as instructed by your doctor.   Begin with a light meal and progress to your normal diet. Heavy or fried foods are harder to digest and may make you feel sick to your stomach (nauseated).   Avoid alcoholic beverages for 24 hours or as instructed.  MEDICATIONS  You may resume your normal medications unless your doctor tells you otherwise.  WHAT YOU CAN EXPECT TODAY  Some feelings of bloating in the abdomen.   Passage of more gas than usual.   Spotting of blood in your stool or on the toilet paper.  IF YOU HAD POLYPS REMOVED DURING THE COLONOSCOPY:  No aspirin products for 7 days or as instructed.   No alcohol for 7 days or as instructed.   Eat a soft diet for the next 24 hours.  FINDING OUT THE RESULTS OF YOUR TEST Not all test results are available during your visit. If your test results are not back during the visit, make an appointment  with your caregiver to find out the results. Do not assume everything is normal if you have not heard from your caregiver or the medical facility. It is important for you to follow up on all of your test results.  SEEK IMMEDIATE MEDICAL ATTENTION IF:  You have more than a spotting of blood in your stool.   Your belly is swollen (abdominal distention).   You are nauseated or vomiting.   You have a temperature over 101.   You have abdominal pain or discomfort that is severe or gets worse throughout the day.   Colon Polyps Polyps are tissue growths inside the body. Polyps can grow in many places, including the large intestine (colon). A polyp may be a round bump or a mushroom-shaped growth. You could have one polyp or several. Most colon polyps are noncancerous (benign). However, some colon polyps can become cancerous over time. What are the causes? The exact cause of colon polyps is not known. What increases the risk? This condition is more likely to develop in people who:  Have a family history of colon cancer or colon polyps.  Are older than 29 or older than 45 if they are African American.  Have inflammatory bowel disease, such as ulcerative colitis or Crohn disease.  Are overweight.  Smoke cigarettes.  Do not get enough exercise.  Drink too much alcohol.  Eat a diet that is: ? High in fat and red meat. ? Low in fiber.  Had childhood cancer that was treated with abdominal radiation.  What are the signs or symptoms? Most polyps do not cause symptoms. If you have symptoms, they may include:  Blood coming from your rectum when having a bowel movement.  Blood in your stool.The stool may look dark red or black.  A change in bowel habits, such as constipation or diarrhea.  How is this diagnosed? This condition is diagnosed with a colonoscopy. This is a procedure that uses a lighted, flexible scope to look at the inside of your colon. How is this treated? Treatment for  this condition involves removing any polyps that are found. Those polyps will then be tested for cancer. If cancer is found, your health care provider will talk to you about options for colon cancer treatment. Follow these instructions at home: Diet  Eat plenty of fiber, such as fruits, vegetables, and whole grains.  Eat foods that are high in calcium and vitamin D, such as milk, cheese, yogurt, eggs, liver, fish, and broccoli.  Limit foods high in fat, red meats, and processed meats, such as hot dogs, sausage, bacon, and lunch meats.  Maintain a healthy weight, or lose weight if recommended by your health care provider. General instructions  Do not smoke cigarettes.  Do not drink alcohol excessively.  Keep all follow-up visits as told by your health care provider. This is important. This includes keeping regularly scheduled colonoscopies. Talk to your health care provider about when you need a colonoscopy.  Exercise every day or as told by your health care provider. Contact a health care provider if:  You have new or worsening bleeding during a bowel movement.  You have new or increased blood in your stool.  You have a change in bowel habits.  You unexpectedly lose weight. This information is not intended to replace advice given to you by your health care provider. Make sure you discuss any questions you have with your health care provider. Document Released: 03/20/2004 Document Revised: 11/30/2015 Document Reviewed: 05/15/2015 Elsevier Interactive Patient Education  [employer].  Diverticulosis Diverticulosis is a condition that develops when small pouches (diverticula) form in the wall of the large intestine (colon). The colon is where water is absorbed and stool is formed. The pouches form when the inside layer of the colon pushes through weak spots in the outer layers of the colon. You may have a few pouches or many of them. What are the causes? The cause of this  condition is not known. What increases the risk? The following factors may make you more likely to develop this condition:  Being older than age 28. Your risk for this condition increases with age. Diverticulosis is rare among people younger than age 78. By age 64, many people have it.  Eating a low-fiber diet.  Having frequent constipation.  Being overweight.  Not getting enough exercise.  Smoking.  Taking over-the-counter pain medicines, like aspirin and ibuprofen.  Having a family history of diverticulosis.  What are the signs or symptoms? In most people, there are no symptoms of this condition. If you do have symptoms, they may include:  Bloating.  Cramps in the abdomen.  Constipation or diarrhea.  Pain in the lower left side of the abdomen.  How is this diagnosed? This condition is most often diagnosed during an exam for other colon problems. Because diverticulosis usually has no symptoms, it often cannot be diagnosed independently. This condition may be diagnosed by:  Using a flexible scope to examine the  colon (colonoscopy).  Taking an X-ray of the colon after dye has been put into the colon (barium enema).  Doing a CT scan.  How is this treated? You may not need treatment for this condition if you have never developed an infection related to diverticulosis. If you have had an infection before, treatment may include:  Eating a high-fiber diet. This may include eating more fruits, vegetables, and grains.  Taking a fiber supplement.  Taking a live bacteria supplement (probiotic).  Taking medicine to relax your colon.  Taking antibiotic medicines.  Follow these instructions at home:  Drink 6-8 glasses of water or more each day to prevent constipation.  Try not to strain when you have a bowel movement.  If you have had an infection before: ? Eat more fiber as directed by your health care provider or your diet and nutrition specialist (dietitian). ? Take  a fiber supplement or probiotic, if your health care provider approves.  Take over-the-counter and prescription medicines only as told by your health care provider.  If you were prescribed an antibiotic, take it as told by your health care provider. Do not stop taking the antibiotic even if you start to feel better.  Keep all follow-up visits as told by your health care provider. This is important. Contact a health care provider if:  You have pain in your abdomen.  You have bloating.  You have cramps.  You have not had a bowel movement in 3 days. Get help right away if:  Your pain gets worse.  Your bloating becomes very bad.  You have a fever or chills, and your symptoms suddenly get worse.  You vomit.  You have bowel movements that are bloody or black.  You have bleeding from your rectum. Summary  Diverticulosis is a condition that develops when small pouches (diverticula) form in the wall of the large intestine (colon).  You may have a few pouches or many of them.  This condition is most often diagnosed during an exam for other colon problems.  If you have had an infection related to diverticulosis, treatment may include increasing the fiber in your diet, taking supplements, or taking medicines. This information is not intended to replace advice given to you by your health care provider. Make sure you discuss any questions you have with your health care provider. Document Released: 03/21/2004 Document Revised: 05/13/2016 Document Reviewed: 05/13/2016 Elsevier Interactive Patient Education  2017 [employer].  Hemorrhoids Hemorrhoids are swollen veins in and around the rectum or anus. Hemorrhoids can cause pain, itching, or bleeding. Most of the time, they do not cause serious problems. They usually get better with diet changes, lifestyle changes, and other home treatments. Follow these instructions at home: Eating and drinking  Eat foods that have fiber, such as  whole grains, beans, nuts, fruits, and vegetables. Ask your doctor about taking products that have added fiber (fibersupplements).  Drink enough fluid to keep your pee (urine) clear or pale yellow. For Pain and Swelling  Take a warm-water bath (sitz bath) for 20 minutes to ease pain. Do this 3-4 times a day.  If directed, put ice on the painful area. It may be helpful to use ice between your warm baths. ? Put ice in a plastic bag. ? Place a towel between your skin and the bag. ? Leave the ice on for 20 minutes, 2-3 times a day. General instructions  Take over-the-counter and prescription medicines only as told by your doctor. ? Medicated creams and  medicines that are inserted into the anus (suppositories) may be used or applied as told.  Exercise often.  Go to the bathroom when you have the urge to poop (to have a bowel movement). Do not wait.  Avoid pushing too hard (straining) when you poop.  Keep the butt area dry and clean. Use wet toilet paper or moist paper towels.  Do not sit on the toilet for a long time. Contact a doctor if:  You have any of these: ? Pain and swelling that do not get better with treatment or medicine. ? Bleeding that will not stop. ? Trouble pooping or you cannot poop. ? Pain or swelling outside the area of the hemorrhoids. This information is not intended to replace advice given to you by your health care provider. Make sure you discuss any questions you have with your health care provider. Document Released: 04/02/2008 Document Revised: 11/30/2015 Document Reviewed: 03/08/2015 Elsevier Interactive Patient Education  2018 [employer].   Colon polyp, diverticulosis and hemorrhoid information provided  Further recommendations to follow pending review of pathology report

## 2018-05-25 NOTE — Anesthesia Postprocedure Evaluation (Signed)
Anesthesia Post Note  Patient: Michelle Skinner  Procedure(s) Performed: COLONOSCOPY WITH PROPOFOL (N/A ) POLYPECTOMY  Patient location during evaluation: PACU Anesthesia Type: General Level of consciousness: awake and alert and oriented Pain management: pain level controlled Vital Signs Assessment: post-procedure vital signs reviewed and stable Respiratory status: spontaneous breathing Cardiovascular status: stable Postop Assessment: no apparent nausea or vomiting Anesthetic complications: no     Last Vitals:  Vitals:   05/25/18 0635  BP: (!) 161/70  Pulse: 67  Resp: 20  Temp: 36.8 C  SpO2: 100%    Last Pain:  Vitals:   05/25/18 0740  TempSrc:   PainSc: 0-No pain                 ADAMS, AMY A

## 2018-05-26 ENCOUNTER — Encounter: Payer: Self-pay | Admitting: Internal Medicine

## 2018-05-28 ENCOUNTER — Encounter (HOSPITAL_COMMUNITY): Payer: Self-pay | Admitting: Internal Medicine

## 2018-07-15 ENCOUNTER — Other Ambulatory Visit: Payer: Self-pay | Admitting: "Endocrinology

## 2018-08-14 ENCOUNTER — Ambulatory Visit: Payer: Medicare Other | Admitting: "Endocrinology

## 2018-08-18 LAB — COMPLETE METABOLIC PANEL WITH GFR
AG Ratio: 1 (calc) (ref 1.0–2.5)
ALKALINE PHOSPHATASE (APISO): 110 U/L (ref 37–153)
ALT: 19 U/L (ref 6–29)
AST: 22 U/L (ref 10–35)
Albumin: 3.5 g/dL — ABNORMAL LOW (ref 3.6–5.1)
BUN: 16 mg/dL (ref 7–25)
CALCIUM: 9.3 mg/dL (ref 8.6–10.4)
CO2: 29 mmol/L (ref 20–32)
CREATININE: 0.99 mg/dL (ref 0.50–1.05)
Chloride: 106 mmol/L (ref 98–110)
GFR, EST NON AFRICAN AMERICAN: 66 mL/min/{1.73_m2} (ref >=60)
GFR, Est African American: 76 mL/min/{1.73_m2} (ref >=60)
Globulin: 3.5 g/dL (calc) (ref 1.9–3.7)
Glucose, Bld: 94 mg/dL (ref 65–139)
POTASSIUM: 4.3 mmol/L (ref 3.5–5.3)
Sodium: 144 mmol/L (ref 135–146)
Total Bilirubin: 0.5 mg/dL (ref 0.2–1.2)
Total Protein: 7 g/dL (ref 6.1–8.1)

## 2018-08-18 LAB — HEMOGLOBIN A1C
EAG (MMOL/L): 12.4 (calc)
Hgb A1c MFr Bld: 9.4 % of total Hgb — ABNORMAL HIGH (ref <5.7)
MEAN PLASMA GLUCOSE: 223 (calc)

## 2018-08-18 LAB — TSH: TSH: 4.28 m[IU]/L

## 2018-08-18 LAB — VITAMIN D 25 HYDROXY (VIT D DEFICIENCY, FRACTURES): Vit D, 25-Hydroxy: 29 ng/mL — ABNORMAL LOW (ref 30–100)

## 2018-08-18 LAB — T4, FREE: FREE T4: 1.3 ng/dL (ref 0.8–1.8)

## 2018-08-26 ENCOUNTER — Ambulatory Visit (INDEPENDENT_AMBULATORY_CARE_PROVIDER_SITE_OTHER): Payer: Medicare Other | Admitting: "Endocrinology

## 2018-08-26 ENCOUNTER — Encounter: Payer: Self-pay | Admitting: "Endocrinology

## 2018-08-26 VITALS — BP 169/82 | HR 74 | Ht 67.5 in | Wt 363.0 lb

## 2018-08-26 DIAGNOSIS — E782 Mixed hyperlipidemia: Secondary | ICD-10-CM | POA: Diagnosis not present

## 2018-08-26 DIAGNOSIS — E1165 Type 2 diabetes mellitus with hyperglycemia: Secondary | ICD-10-CM

## 2018-08-26 DIAGNOSIS — E118 Type 2 diabetes mellitus with unspecified complications: Secondary | ICD-10-CM | POA: Diagnosis not present

## 2018-08-26 DIAGNOSIS — IMO0002 Reserved for concepts with insufficient information to code with codable children: Secondary | ICD-10-CM

## 2018-08-26 DIAGNOSIS — I10 Essential (primary) hypertension: Secondary | ICD-10-CM

## 2018-08-26 DIAGNOSIS — Z794 Long term (current) use of insulin: Secondary | ICD-10-CM

## 2018-08-26 DIAGNOSIS — E039 Hypothyroidism, unspecified: Secondary | ICD-10-CM

## 2018-08-26 MED ORDER — LEVOTHYROXINE SODIUM 125 MCG PO TABS: 125.0000 ug | ORAL_TABLET | Freq: Every day | ORAL | 1 refills | Status: DC

## 2018-08-26 MED ORDER — LISINOPRIL 20 MG PO TABS: 20.0000 mg | ORAL_TABLET | Freq: Every day | ORAL | 1 refills | Status: DC

## 2018-08-26 MED ORDER — ATORVASTATIN CALCIUM 20 MG PO TABS: ORAL_TABLET | ORAL | 1 refills | Status: DC

## 2018-08-26 NOTE — Progress Notes (Signed)
Endocrinology follow-up note  Subjective:    Patient ID: Michelle Skinner, female    DOB: 11/29/1965,    Past Medical History:  Diagnosis Date  . Anemia   . Cervical cancer ([place])   . Depression   . Diabetes mellitus, type II ([place])   . GERD (gastroesophageal reflux disease)   . Hyperlipidemia   . Neuropathy   . PTSD (post-traumatic stress disorder)    Past Surgical History:  Procedure Laterality Date  . ABDOMINAL HYSTERECTOMY    . COLONOSCOPY WITH PROPOFOL N/A 05/25/2018   Procedure: COLONOSCOPY WITH PROPOFOL;  Surgeon: Daneil Dolin, MD;  Location: [facility];  Service: Endoscopy;  Laterality: N/A;  7:30am  . OTHER SURGICAL HISTORY Right    R Foot- I&D  . POLYPECTOMY  05/25/2018   Procedure: POLYPECTOMY;  Surgeon: Daneil Dolin, MD;  Location: [facility];  Service: Endoscopy;;  colon  . UMBILICAL HERNIA REPAIR  2007   Social History   Socioeconomic History  . Marital status: Married    Spouse name: Not on file  . Number of children: Not on file  . Years of education: Not on file  . Highest education level: Not on file  Occupational History  . Not on file  Social Needs  . Financial resource strain: Not on file  . Food insecurity:    Worry: Not on file    Inability: Not on file  . Transportation needs:    Medical: Not on file    Non-medical: Not on file  Tobacco Use  . Smoking status: Never Smoker  . Smokeless tobacco: Never Used  Substance and Sexual Activity  . Alcohol use: No    Alcohol/week: 0.0 standard drinks  . Drug use: No  . Sexual activity: Yes    Birth control/protection: Surgical  Lifestyle  . Physical activity:    Days per week: Not on file    Minutes per session: Not on file  . Stress: Not on file  Relationships  . Social connections:    Talks on phone: Not on file    Gets together: Not on file    Attends religious service: Not on file    Active member of club or organization: Not on file    Attends meetings of clubs or  organizations: Not on file    Relationship status: Not on file  Other Topics Concern  . Not on file  Social History Narrative  . Not on file   Outpatient Encounter Medications as of 08/26/2018  Medication Sig  . atorvastatin (LIPITOR) 20 MG tablet TAKE 1 TABLET BY MOUTH  DAILY AT 6 PM.  . Biotin (BIOTIN 5000) 5 MG CAPS Take 5 mg by mouth daily.  . Blood Glucose Monitoring Suppl (ONE TOUCH ULTRA 2) w/Device KIT Use to test blood glucose  . CALCIUM PO Take 2 each by mouth daily.   . Cholecalciferol (VITAMIN D3) 125 MCG (5000 UT) CAPS Take 1 capsule (5,000 Units total) by mouth daily.  Marland Kitchen glucose blood (ONE TOUCH ULTRA TEST) test strip USE TO TEST BLOOD GLUCOSE 4 x daily E11.65  . Insulin Degludec (TRESIBA FLEXTOUCH) 200 UNIT/ML SOPN Inject 100 Units into the skin at bedtime.  . Insulin Lispro (HUMALOG KWIKPEN) 200 UNIT/ML SOPN Inject 25-31 Units into the skin 3 (three) times daily before meals.  Marland Kitchen levothyroxine (SYNTHROID, LEVOTHROID) 125 MCG tablet Take 1 tablet (125 mcg total) by mouth daily.  Marland Kitchen lisinopril (PRINIVIL,ZESTRIL) 20 MG tablet Take 1 tablet (20 mg total)  by mouth daily.  . Multiple Vitamin (MULTIVITAMIN) tablet Take 1 tablet by mouth daily.  Marland Kitchen omeprazole (PRILOSEC) 20 MG capsule Take 1 capsule (20 mg total) by mouth 2 (two) times daily before a meal. For 10 days while on Pylera for H.pylori  . pantoprazole (PROTONIX) 40 MG tablet TAKE 1 TABLET BY MOUTH  DAILY  . pregabalin (LYRICA) 75 MG capsule Take 75 mg by mouth daily as needed (for pain).   . [DISCONTINUED] atorvastatin (LIPITOR) 20 MG tablet TAKE 1 TABLET BY MOUTH  DAILY AT 6 PM.  . [DISCONTINUED] levothyroxine (SYNTHROID, LEVOTHROID) 125 MCG tablet Take 1 tablet (125 mcg total) by mouth daily.  . [DISCONTINUED] lisinopril (PRINIVIL,ZESTRIL) 20 MG tablet Take 1 tablet (20 mg total) by mouth daily.   No facility-administered encounter medications on file as of 08/26/2018.    ALLERGIES: Allergies  Allergen Reactions  .  Sulfa Antibiotics Rash  . Bactrim [Sulfamethoxazole-Trimethoprim] Itching  . Cherry Other (See Comments)    Unknown  . Gabapentin Other (See Comments)    Unknown  . Invokana [Canagliflozin] Other (See Comments)    Unknown   VACCINATION STATUS:  There is no immunization history on file for this patient.  Diabetes  She presents for her follow-up diabetic visit. She has type 2 diabetes mellitus. Onset time: She was diagnosed at approximate age of 63 years. Her disease course has been improving. There are no hypoglycemic associated symptoms. Pertinent negatives for hypoglycemia include no confusion, headaches, pallor or seizures. Associated symptoms include fatigue, polydipsia and polyuria. Pertinent negatives for diabetes include no chest pain and no polyphagia. There are no hypoglycemic complications. Symptoms are improving. Diabetic complications include peripheral neuropathy and retinopathy. Pertinent negatives for diabetic complications include no autonomic neuropathy. Risk factors for coronary artery disease include diabetes mellitus, dyslipidemia, hypertension, obesity and sedentary lifestyle. She is compliant with treatment most of the time. Her weight is increasing steadily. She is following a generally unhealthy diet. She has not had a previous visit with a dietitian (She missed her appointment.). She never participates in exercise. Her home blood glucose trend is decreasing steadily. Her breakfast blood glucose range is generally >200 mg/dl. Her lunch blood glucose range is generally >200 mg/dl. Her dinner blood glucose range is generally >200 mg/dl. Her bedtime blood glucose range is generally >200 mg/dl. Her overall blood glucose range is >200 mg/dl. (She returns with slightly better glycemic profile and A1c of 9.4%.  She did not tolerate Tresiba 100 units nightly, lowered it to 90 units.) An ACE inhibitor/angiotensin II receptor blocker is not being taken. Eye exam is current.   Hyperlipidemia  This is a chronic problem. The current episode started more than 1 year ago. The problem is uncontrolled. Recent lipid tests were reviewed and are high. Exacerbating diseases include diabetes, hypothyroidism and obesity. Pertinent negatives include no chest pain, myalgias or shortness of breath. She is currently on no antihyperlipidemic treatment.  Thyroid Problem  Presents for initial visit. Symptoms include fatigue. Patient reports no cold intolerance, diarrhea, heat intolerance or palpitations. The following procedures have not been performed: radioiodine uptake scan and thyroidectomy. Her past medical history is significant for diabetes and hyperlipidemia.     Review of Systems  Constitutional: Positive for fatigue. Negative for unexpected weight change.  HENT: Negative for trouble swallowing and voice change.   Eyes: Negative for visual disturbance.  Respiratory: Negative for cough, shortness of breath and wheezing.   Cardiovascular: Negative for chest pain, palpitations and leg swelling.  Gastrointestinal: Negative for  diarrhea, nausea and vomiting.  Endocrine: Positive for polydipsia and polyuria. Negative for cold intolerance, heat intolerance and polyphagia.  Musculoskeletal: Negative for arthralgias and myalgias.  Skin: Negative for color change, pallor, rash and wound.  Neurological: Negative for seizures and headaches.  Psychiatric/Behavioral: Negative for confusion and suicidal ideas.    Objective:    BP (!) 169/82   Pulse 74   Ht 5' 7.5" (1.715 m)   Wt (!) 363 lb (164.7 kg)   BMI 56.01 kg/m   Wt Readings from Last 3 Encounters:  08/26/18 (!) 363 lb (164.7 kg)  05/18/18 (!) 352 lb (159.7 kg)  05/13/18 (!) 352 lb (159.7 kg)    Physical Exam  Constitutional: She is oriented to person, place, and time. She appears well-developed.  She has poor hygiene generally.  HENT:  Head: Normocephalic and atraumatic.  She has poor dentition.  Eyes: EOM are  normal.  Neck: Normal range of motion. Neck supple. No tracheal deviation present. No thyromegaly present.  Cardiovascular: Normal rate.  Pulmonary/Chest: Effort normal.  Abdominal: There is no abdominal tenderness. There is no guarding.  Musculoskeletal:        General: Deformity present. No edema.     Comments: She has lateral peripheral neuropathy, as well as bilateral Charcot's feet.  Neurological: She is alert and oriented to person, place, and time. No cranial nerve deficit. Coordination normal.  Skin: Skin is warm and dry. No rash noted. No erythema. No pallor.  Psychiatric: She has a normal mood and affect. Judgment normal.  Reluctant , passive affect .    Results for orders placed or performed during the hospital encounter of 05/25/18  Glucose, capillary  Result Value Ref Range   Glucose-Capillary 143 (H) 70 - 99 mg/dL  Glucose, capillary  Result Value Ref Range   Glucose-Capillary 147 (H) 70 - 99 mg/dL   Diabetic Labs (most recent): Lab Results  Component Value Date   HGBA1C 9.4 (H) 08/17/2018   HGBA1C 9.9 (H) 05/08/2018   HGBA1C 10.9 (H) 02/02/2018    Lipid Panel     Component Value Date/Time   CHOL 166 04/09/2017 1159   TRIG 131 04/09/2017 1159   HDL 37 (L) 04/09/2017 1159   CHOLHDL 4.5 04/09/2017 1159   VLDL 42 (H) 04/25/2015 1001   LDLCALC 106 (H) 04/09/2017 1159     Assessment & Plan:   1. Uncontrolled type 2 diabetes mellitus with complication, with long-term current use of insulin ([facility])   Her diabetes is complicated by bilateral peripheral neuropathy , lateral Charcot's feet, history of noncompliance/nonadherence, and possible retinopathy. - She has  uncontrolled symptomatic type 2 DM of approximately since age 59 years duration. - She came with her logs showing slowly improving glycemic profile, A1c of 9.4% improving from 10.9%.    -She remains at extremely high risk for more acute and chronic complications of diabetes which include CAD, CVA, CKD,  retinopathy, and neuropathy. These are all discussed in detail with the patient.  - I have counseled the patient on diet management and weight loss, by adopting a carbohydrate restricted/protein rich diet.  - Patient is advised to stick to a routine mealtimes to eat 3 meals  a day and avoid unnecessary snacks ( to snack only to correct hypoglycemia).  - Patient admits there is a room for improvement in her diet and drink choices. -  Suggestion is made for her to avoid simple carbohydrates  from her diet including Cakes, Sweet Desserts / Pastries, Ice Cream, Soda (  diet and regular), Sweet Tea, Candies, Chips, Cookies, Store Bought Juices, Alcohol in Excess of  1-2 drinks a day, Artificial Sweeteners, and "Sugar-free" Products. This will help patient to have stable blood glucose profile and potentially avoid unintended weight gain.  - I encouraged the patient to switch to  unprocessed or minimally processed complex starch and increased protein intake (animal or plant source), fruits, and vegetables.   - I have approached patient with the following individualized plan to manage diabetes and patient agrees:    -She did not tolerate Tresiba 100 units.   -She will continue to require intensive treatment with basal/bolus insulin at higher dose in order for her to achieve and maintain control of diabetes to target.    -She is advised to lower her Tresiba to 90 units nightly, continue Humalog  25 units  (plus correction) 3 times daily AC for pe-meal blood glucose above 90 mg/dL, associated with strict monitoring of blood glucose 4 times a day-before meals and at bedtime. -She is currently using the libre CGM device, advised to wear it at all times.  - She is warned not to take insulin without monitoring blood glucose properly. -She is given individualized chart to correct hyperglycemia above 150 mg/dL. - She will be considered for insulin U500 if she continues to require greater than 200 units of  insulin U100 during her next visit.    -Patient is encouraged to call clinic for blood glucose levels less than 70 or above 300 mg /dl. - she is adamant saying that she does not tolerate MTF, Invokana, and [place].  - Patient specific target  A1c;  LDL, HDL, Triglycerides, and  Waist Circumference were discussed in detail.  2) BP/HTN: Her blood pressure is not controlled to target.  She skipped her blood pressure medications this morning.  She is advised to take her blood pressure medications in the morning including lisinopril 20 mg p.o. daily, I refilled her  prescription.    3) Lipids/HPL: Most recent lipid panel showed LDL of 106, uncontrolled.  She is advised to continue Lipitor 20 mg p.o. nightly.   4) vitamin D deficiency:  -She is on ongoing supplement with vitamin D3 5000 units daily.    5)  Weight/Diet: she keeps missing her appointment with  CDE exercise, and carbohydrates information provided.   6) hypothyroidism:  -Her previsit thyroid function tests are consistent with appropriate replacement. -She is advised to continue levothyroxine 125 mcg p.o. daily before breakfast.    - We discussed about the correct intake of her thyroid hormone, on empty stomach at fasting, with water, separated by at least 30 minutes from breakfast and other medications,  and separated by more than 4 hours from calcium, iron, multivitamins, acid reflux medications (PPIs). -Patient is made aware of the fact that thyroid hormone replacement is needed for life, dose to be adjusted by periodic monitoring of thyroid function tests.   6) Chronic Care/Health Maintenance:  -Patient isinitiated on  Statin medications. She is encouraged to continue to follow up with Ophthalmology, Podiatrist at least yearly or according to recommendations, and advised to stay away from smoking. I have recommended yearly flu vaccine and pneumonia vaccination at least every 5 years; moderate intensity exercise for up to 150  minutes weekly; and  sleep for at least 7 hours a day.  -Given her significant peripheral neuropathy and bilateral Charcot's feet, she will benefit from continued supply of diabetic shoes.  I advised patient to maintain close follow up with their  PCP for primary care needs.   - Time spent with the patient: 25 min, of which >50% was spent in reviewing her blood glucose logs , discussing her hypoglycemia and hyperglycemia episodes, reviewing her current and  previous labs / studies and medications  doses and developing a plan to avoid hypoglycemia and hyperglycemia. Please refer to Patient Instructions for Blood Glucose Monitoring and Insulin/Medications Dosing Guide"  in media tab for additional information. Michelle Skinner participated in the discussions, expressed understanding, and voiced agreement with the above plans.  All questions were answered to her satisfaction. she is encouraged to contact clinic should she have any questions or concerns prior to her return visit.   Follow up plan: Return in about 4 months (around 12/25/2018) for Meter, and Logs.  Glade Lloyd, MD Phone: [phone]  Fax: [phone]  -  This note was partially dictated with voice recognition software. Similar sounding words can be transcribed inadequately or may not  be corrected upon review.  08/26/2018, 10:28 AM

## 2018-08-26 NOTE — Patient Instructions (Signed)

## 2018-11-19 LAB — COMPLETE METABOLIC PANEL WITH GFR
AG RATIO: 1 (calc) (ref 1.0–2.5)
ALKALINE PHOSPHATASE (APISO): 110 U/L (ref 37–153)
ALT: 20 U/L (ref 6–29)
AST: 14 U/L (ref 10–35)
Albumin: 3.4 g/dL — ABNORMAL LOW (ref 3.6–5.1)
BUN: 23 mg/dL (ref 7–25)
CALCIUM: 9.2 mg/dL (ref 8.6–10.4)
CO2: 30 mmol/L (ref 20–32)
Chloride: 104 mmol/L (ref 98–110)
Creat: 0.94 mg/dL (ref 0.50–1.05)
GFR, Est African American: 81 mL/min/{1.73_m2} (ref >=60)
GFR, Est Non African American: 70 mL/min/{1.73_m2} (ref >=60)
Globulin: 3.4 g/dL (calc) (ref 1.9–3.7)
Glucose, Bld: 178 mg/dL — ABNORMAL HIGH (ref 65–99)
Potassium: 4.4 mmol/L (ref 3.5–5.3)
Sodium: 141 mmol/L (ref 135–146)
Total Bilirubin: 0.4 mg/dL (ref 0.2–1.2)
Total Protein: 6.8 g/dL (ref 6.1–8.1)

## 2018-11-19 LAB — LIPID PANEL
CHOLESTEROL: 175 mg/dL (ref <200)
HDL: 40 mg/dL — AB (ref >=50)
LDL Cholesterol (Calc): 109 mg/dL (calc) — ABNORMAL HIGH
Non-HDL Cholesterol (Calc): 135 mg/dL (calc) — ABNORMAL HIGH (ref <130)
TRIGLYCERIDES: 143 mg/dL (ref <150)
Total CHOL/HDL Ratio: 4.4 (calc) (ref <5.0)

## 2018-11-19 LAB — HEMOGLOBIN A1C
HEMOGLOBIN A1C: 8.9 %{Hb} — AB (ref <5.7)
MEAN PLASMA GLUCOSE: 209 (calc)
eAG (mmol/L): 11.6 (calc)

## 2018-11-19 LAB — MICROALBUMIN / CREATININE URINE RATIO
Creatinine, Urine: 216 mg/dL (ref 20–275)
Microalb Creat Ratio: 588 mcg/mg creat — ABNORMAL HIGH (ref <30)
Microalb, Ur: 126.9 mg/dL

## 2018-11-23 ENCOUNTER — Ambulatory Visit: Payer: Medicare Other | Admitting: "Endocrinology

## 2018-11-28 ENCOUNTER — Other Ambulatory Visit: Payer: Self-pay | Admitting: "Endocrinology

## 2018-12-04 ENCOUNTER — Other Ambulatory Visit: Payer: Self-pay

## 2018-12-04 ENCOUNTER — Encounter: Payer: Self-pay | Admitting: "Endocrinology

## 2018-12-04 ENCOUNTER — Ambulatory Visit (INDEPENDENT_AMBULATORY_CARE_PROVIDER_SITE_OTHER): Payer: Medicare Other | Admitting: "Endocrinology

## 2018-12-04 DIAGNOSIS — E782 Mixed hyperlipidemia: Secondary | ICD-10-CM | POA: Diagnosis not present

## 2018-12-04 DIAGNOSIS — E118 Type 2 diabetes mellitus with unspecified complications: Secondary | ICD-10-CM | POA: Diagnosis not present

## 2018-12-04 DIAGNOSIS — IMO0002 Reserved for concepts with insufficient information to code with codable children: Secondary | ICD-10-CM

## 2018-12-04 DIAGNOSIS — E039 Hypothyroidism, unspecified: Secondary | ICD-10-CM | POA: Diagnosis not present

## 2018-12-04 DIAGNOSIS — I10 Essential (primary) hypertension: Secondary | ICD-10-CM

## 2018-12-04 DIAGNOSIS — Z794 Long term (current) use of insulin: Secondary | ICD-10-CM

## 2018-12-04 DIAGNOSIS — E1165 Type 2 diabetes mellitus with hyperglycemia: Secondary | ICD-10-CM

## 2018-12-04 MED ORDER — INSULIN DEGLUDEC 200 UNIT/ML ~~LOC~~ SOPN: 80.0000 [IU] | PEN_INJECTOR | Freq: Every day | SUBCUTANEOUS | 2 refills | Status: DC

## 2018-12-04 NOTE — Progress Notes (Signed)
12/04/2018                                                    Endocrinology Telehealth Visit Follow up Note -During COVID -19 Pandemic  This visit type was conducted due to national recommendations for restrictions regarding the COVID-19 Pandemic  in an effort to limit this patient's exposure and mitigate transmission of the corona virus.  Due to her co-morbid illnesses, ALAYZHA AN is at  moderate to high risk for complications without adequate follow up.  This format is felt to be most appropriate for her at this time.  I connected with this patient on 12/04/2018   by telephone and verified that I am speaking with the correct person using two identifiers. Michelle Skinner, 1965/10/15. she has verbally consented to this visit. All issues noted in this document were discussed and addressed. The format was not optimal for physical exam.    Subjective:    Patient ID: Michelle Skinner, female    DOB: [birth year],    Past Medical History:  Diagnosis Date  . Anemia   . Cervical cancer ([facility])   . Depression   . Diabetes mellitus, type II ([place])   . GERD (gastroesophageal reflux disease)   . Hyperlipidemia   . Neuropathy   . PTSD (post-traumatic stress disorder)    Past Surgical History:  Procedure Laterality Date  . ABDOMINAL HYSTERECTOMY    . COLONOSCOPY WITH PROPOFOL N/A 05/25/2018   Procedure: COLONOSCOPY WITH PROPOFOL;  Surgeon: Daneil Dolin, MD;  Location: [facility];  Service: Endoscopy;  Laterality: N/A;  7:30am  . OTHER SURGICAL HISTORY Right    R Foot- I&D  . POLYPECTOMY  05/25/2018   Procedure: POLYPECTOMY;  Surgeon: Daneil Dolin, MD;  Location: [facility];  Service: Endoscopy;;  colon  . UMBILICAL HERNIA REPAIR  2007   Social History   Socioeconomic History  . Marital status: Married    Spouse name: Not on file  . Number of children: Not on file  . Years of education: Not on file  . Highest education level: Not on file  Occupational History  . Not on file   Social Needs  . Financial resource strain: Not on file  . Food insecurity:    Worry: Not on file    Inability: Not on file  . Transportation needs:    Medical: Not on file    Non-medical: Not on file  Tobacco Use  . Smoking status: Never Smoker  . Smokeless tobacco: Never Used  Substance and Sexual Activity  . Alcohol use: No    Alcohol/week: 0.0 standard drinks  . Drug use: No  . Sexual activity: Yes    Birth control/protection: Surgical  Lifestyle  . Physical activity:    Days per week: Not on file    Minutes per session: Not on file  . Stress: Not on file  Relationships  . Social connections:    Talks on phone: Not on file    Gets together: Not on file    Attends religious service: Not on file    Active member of club or organization: Not on file    Attends meetings of clubs or organizations: Not on file    Relationship status: Not on file  Other Topics Concern  . Not on file  Social  History Narrative  . Not on file   Outpatient Encounter Medications as of 12/04/2018  Medication Sig  . atorvastatin (LIPITOR) 20 MG tablet TAKE 1 TABLET BY MOUTH  DAILY AT 6 PM.  . Biotin (BIOTIN 5000) 5 MG CAPS Take 5 mg by mouth daily.  . Blood Glucose Monitoring Suppl (ONE TOUCH ULTRA 2) w/Device KIT Use to test blood glucose  . CALCIUM PO Take 2 each by mouth daily.   . Cholecalciferol (VITAMIN D3) 125 MCG (5000 UT) CAPS Take 1 capsule (5,000 Units total) by mouth daily.  Marland Kitchen glucose blood (ONE TOUCH ULTRA TEST) test strip USE TO TEST BLOOD GLUCOSE 4 x daily E11.65  . Insulin Degludec (TRESIBA FLEXTOUCH) 200 UNIT/ML SOPN Inject 80 Units into the skin at bedtime.  . Insulin Lispro (HUMALOG KWIKPEN) 200 UNIT/ML SOPN Inject 25-31 Units into the skin 3 (three) times daily before meals.  Marland Kitchen levothyroxine (SYNTHROID) 125 MCG tablet TAKE ONE (1) TABLET EACH DAY  . lisinopril (PRINIVIL,ZESTRIL) 20 MG tablet Take 1 tablet (20 mg total) by mouth daily.  . Multiple Vitamin (MULTIVITAMIN) tablet  Take 1 tablet by mouth daily.  Marland Kitchen omeprazole (PRILOSEC) 20 MG capsule Take 1 capsule (20 mg total) by mouth 2 (two) times daily before a meal. For 10 days while on Pylera for H.pylori  . pantoprazole (PROTONIX) 40 MG tablet TAKE ONE (1) TABLET EACH DAY  . pregabalin (LYRICA) 75 MG capsule Take 75 mg by mouth daily as needed (for pain).   . [DISCONTINUED] Insulin Degludec (TRESIBA FLEXTOUCH) 200 UNIT/ML SOPN Inject 100 Units into the skin at bedtime.   No facility-administered encounter medications on file as of 12/04/2018.    ALLERGIES: Allergies  Allergen Reactions  . Sulfa Antibiotics Rash  . Bactrim [Sulfamethoxazole-Trimethoprim] Itching  . Cherry Other (See Comments)    Unknown  . Gabapentin Other (See Comments)    Unknown  . Invokana [Canagliflozin] Other (See Comments)    Unknown   VACCINATION STATUS:  There is no immunization history on file for this patient.  Diabetes  She presents for her follow-up diabetic visit. She has type 2 diabetes mellitus. Onset time: She was diagnosed at approximate age of 53 years. Her disease course has been improving. There are no hypoglycemic associated symptoms. Pertinent negatives for hypoglycemia include no confusion, headaches, pallor or seizures. Associated symptoms include fatigue, polydipsia and polyuria. Pertinent negatives for diabetes include no chest pain and no polyphagia. There are no hypoglycemic complications. Symptoms are improving. Diabetic complications include peripheral neuropathy and retinopathy. Pertinent negatives for diabetic complications include no autonomic neuropathy. Risk factors for coronary artery disease include diabetes mellitus, dyslipidemia, hypertension, obesity and sedentary lifestyle. She is compliant with treatment most of the time. Her weight is increasing steadily. She is following a generally unhealthy diet. She has not had a previous visit with a dietitian (She missed her appointment.). She never participates in  exercise. Her home blood glucose trend is fluctuating minimally. Her breakfast blood glucose range is generally 180-200 mg/dl. Her lunch blood glucose range is generally >200 mg/dl. Her dinner blood glucose range is generally >200 mg/dl. Her bedtime blood glucose range is generally >200 mg/dl. Her overall blood glucose range is >200 mg/dl. (She returns with slightly better glycemic profile and A1c of 9.4%.  She did not tolerate Tresiba 100 units nightly, lowered it to 90 units.) An ACE inhibitor/angiotensin II receptor blocker is not being taken. Eye exam is current.  Hyperlipidemia  This is a chronic problem. The current episode started  more than 1 year ago. The problem is uncontrolled. Recent lipid tests were reviewed and are high. Exacerbating diseases include diabetes, hypothyroidism and obesity. Pertinent negatives include no chest pain, myalgias or shortness of breath. She is currently on no antihyperlipidemic treatment.  Thyroid Problem  Presents for initial visit. Symptoms include fatigue. Patient reports no cold intolerance, diarrhea, heat intolerance or palpitations. The following procedures have not been performed: radioiodine uptake scan and thyroidectomy. Her past medical history is significant for diabetes and hyperlipidemia.   Review of systems:  Limited as above.   Objective:    There were no vitals taken for this visit.  Wt Readings from Last 3 Encounters:  08/26/18 (!) 363 lb (164.7 kg)  05/18/18 (!) 352 lb (159.7 kg)  05/13/18 (!) 352 lb (159.7 kg)     Results for orders placed or performed in visit on 08/26/18  Hemoglobin A1c  Result Value Ref Range   Hgb A1c MFr Bld 8.9 (H) <5.7 % of total Hgb   Mean Plasma Glucose 209 (calc)   eAG (mmol/L) 11.6 (calc)  COMPLETE METABOLIC PANEL WITH GFR  Result Value Ref Range   Glucose, Bld 178 (H) 65 - 99 mg/dL   BUN 23 7 - 25 mg/dL   Creat 0.94 0.50 - 1.05 mg/dL   GFR, Est Non African American 70 > OR = 60 mL/min/1.80m   GFR,  Est African American 81 > OR = 60 mL/min/1.756m  BUN/Creatinine Ratio NOT APPLICABLE 6 - 22 (calc)   Sodium 141 135 - 146 mmol/L   Potassium 4.4 3.5 - 5.3 mmol/L   Chloride 104 98 - 110 mmol/L   CO2 30 20 - 32 mmol/L   Calcium 9.2 8.6 - 10.4 mg/dL   Total Protein 6.8 6.1 - 8.1 g/dL   Albumin 3.4 (L) 3.6 - 5.1 g/dL   Globulin 3.4 1.9 - 3.7 g/dL (calc)   AG Ratio 1.0 1.0 - 2.5 (calc)   Total Bilirubin 0.4 0.2 - 1.2 mg/dL   Alkaline phosphatase (APISO) 110 37 - 153 U/L   AST 14 10 - 35 U/L   ALT 20 6 - 29 U/L  Lipid panel  Result Value Ref Range   Cholesterol 175 <200 mg/dL   HDL 40 (L) > OR = 50 mg/dL   Triglycerides 143 <150 mg/dL   LDL Cholesterol (Calc) 109 (H) mg/dL (calc)   Total CHOL/HDL Ratio 4.4 <5.0 (calc)   Non-HDL Cholesterol (Calc) 135 (H) <130 mg/dL (calc)  Microalbumin / creatinine urine ratio  Result Value Ref Range   Creatinine, Urine 216 20 - 275 mg/dL   Microalb, Ur 126.9 mg/dL   Microalb Creat Ratio 588 (H) <30 mcg/mg creat   Diabetic Labs (most recent): Lab Results  Component Value Date   HGBA1C 8.9 (H) 11/18/2018   HGBA1C 9.4 (H) 08/17/2018   HGBA1C 9.9 (H) 05/08/2018    Lipid Panel     Component Value Date/Time   CHOL 175 11/18/2018 0833   TRIG 143 11/18/2018 0833   HDL 40 (L) 11/18/2018 0833   CHOLHDL 4.4 11/18/2018 0833   VLDL 42 (H) 04/25/2015 1001   LDLCALC 109 (H) 11/18/2018 0833     Assessment & Plan:   1. Uncontrolled type 2 diabetes mellitus with complication, with long-term current use of insulin ([facility])   Her diabetes is complicated by bilateral peripheral neuropathy , lateral Charcot's feet, history of noncompliance/nonadherence, and possible retinopathy. - She has  uncontrolled symptomatic type 2 DM of approximately since age 53  years duration. - She ports significantly fluctuating glycemic profile, A1c of 8.9% better than her last several visits.   -She reports some rare random hypoglycemia, daily due to her discoordinated meal/  insulin timing.  -Some days she does not eat breakfast until 10 or 11 AM.  -She remains at extremely high risk for more acute and chronic complications of diabetes which include CAD, CVA, CKD, retinopathy, and neuropathy. These are all discussed in detail with the patient.   - I have counseled the patient on diet management and weight loss, by adopting a carbohydrate restricted/protein rich diet.  - Patient is advised to stick to a routine mealtimes to eat 3 meals  a day and avoid unnecessary snacks ( to snack only to correct hypoglycemia).   - Patient admits there is a room for improvement in her diet and drink choices. -  Suggestion is made for her to avoid simple carbohydrates  from her diet including Cakes, Sweet Desserts / Pastries, Ice Cream, Soda (diet and regular), Sweet Tea, Candies, Chips, Cookies, Store Bought Juices, Alcohol in Excess of  1-2 drinks a day, Artificial Sweeteners, and "Sugar-free" Products. This will help patient to have stable blood glucose profile and potentially avoid unintended weight gain.  - I encouraged the patient to switch to  unprocessed or minimally processed complex starch and increased protein intake (animal or plant source), fruits, and vegetables.   - I have approached patient with the following individualized plan to manage diabetes and patient agrees:   -She will continue to require intensive treatment with basal/bolus insulin at higher dose in order for her to achieve and maintain control of diabetes to target.    -She is advised to lower her Tresiba to 80 units nightly, continue Humalog  25 units  (plus correction) 3 times daily AC for pe-meal blood glucose above 90 mg/dL, associated with strict monitoring of blood glucose 4 times a day-before meals and at bedtime. -She is currently using the libre CGM device, advised to wear it at all times.  - She is warned not to take insulin without monitoring blood glucose properly. -She is given  individualized chart to correct hyperglycemia above 150 mg/dL. - She will be considered for insulin U500 if she continues to require greater than 200 units of insulin U100 during her next visit.    -Patient is encouraged to call clinic for blood glucose levels less than 70 or above 300 mg /dl. - she is adamant saying that she does not tolerate MTF, Invokana, and [place].  - Patient specific target  A1c;  LDL, HDL, Triglycerides, and  Waist Circumference were discussed in detail.  2) BP/HTN: she is advised to home monitor blood pressure and report if > 140/90 on 2 separate readings.    She is advised to take her blood pressure medications in the morning including lisinopril 20 mg p.o. daily, I refilled her  prescription.    3) Lipids/HPL: Most recent lipid panel showed LDL of 106, uncontrolled.  She is advised to continue Lipitor 20 mg p.o. nightly.   4) vitamin D deficiency:  -She is on ongoing supplement with vitamin D3 5000 units daily.    5)  Weight/Diet: she keeps missing her appointment with  CDE exercise, and carbohydrates information provided.   6) hypothyroidism:  -Her previsit thyroid function tests are consistent with appropriate replacement. -She is advised to continue levothyroxine 125 mcg p.o. daily before breakfast.    - We discussed about the correct intake of her thyroid  hormone, on empty stomach at fasting, with water, separated by at least 30 minutes from breakfast and other medications,  and separated by more than 4 hours from calcium, iron, multivitamins, acid reflux medications (PPIs). -Patient is made aware of the fact that thyroid hormone replacement is needed for life, dose to be adjusted by periodic monitoring of thyroid function tests.  6) Chronic Care/Health Maintenance:  -Patient is initiated on  Statin medications. She is encouraged to continue to follow up with Ophthalmology, Podiatrist at least yearly or according to recommendations, and advised to stay away  from smoking. I have recommended yearly flu vaccine and pneumonia vaccination at least every 5 years; moderate intensity exercise for up to 150 minutes weekly; and  sleep for at least 7 hours a day.  -Given her significant peripheral neuropathy and bilateral Charcot's feet, she will benefit from continued supply of diabetic shoes.  I advised patient to maintain close follow up with their PCP for primary care needs.   - Patient Care Time Today:  25 min, of which >50% was spent in reviewing her  current and  previous labs/studies, her blood glucose readings, previous treatments, and medications doses and developing a plan for long-term care based on the latest recommendations for standards of care.  Michelle Skinner participated in the discussions, expressed understanding, and voiced agreement with the above plans.  All questions were answered to her satisfaction. she is encouraged to contact clinic should she have any questions or concerns prior to her return visit.  Follow up plan: Return in about 4 months (around 04/06/2019) for Follow up with Pre-visit Labs, Meter, and Logs.  Glade Lloyd, MD Phone: [phone]  Fax: [phone]  -  This note was partially dictated with voice recognition software. Similar sounding words can be transcribed inadequately or may not  be corrected upon review.  12/04/2018, 11:54 AM

## 2019-04-02 LAB — T4, FREE: Free T4: 1.5 ng/dL (ref 0.8–1.8)

## 2019-04-02 LAB — COMPLETE METABOLIC PANEL WITH GFR
AG Ratio: 1.1 (calc) (ref 1.0–2.5)
ALT: 16 U/L (ref 6–29)
AST: 13 U/L (ref 10–35)
Albumin: 3.2 g/dL — ABNORMAL LOW (ref 3.6–5.1)
Alkaline phosphatase (APISO): 94 U/L (ref 37–153)
BUN/Creatinine Ratio: 16 (calc) (ref 6–22)
BUN: 17 mg/dL (ref 7–25)
CO2: 31 mmol/L (ref 20–32)
Calcium: 9.1 mg/dL (ref 8.6–10.4)
Chloride: 105 mmol/L (ref 98–110)
Creat: 1.08 mg/dL — ABNORMAL HIGH (ref 0.50–1.05)
GFR, Est African American: 69 mL/min/{1.73_m2} (ref >=60)
GFR, Est Non African American: 59 mL/min/{1.73_m2} — ABNORMAL LOW (ref >=60)
Globulin: 2.9 g/dL (calc) (ref 1.9–3.7)
Glucose, Bld: 246 mg/dL — ABNORMAL HIGH (ref 65–99)
Potassium: 4.8 mmol/L (ref 3.5–5.3)
Sodium: 143 mmol/L (ref 135–146)
Total Bilirubin: 0.6 mg/dL (ref 0.2–1.2)
Total Protein: 6.1 g/dL (ref 6.1–8.1)

## 2019-04-02 LAB — HEMOGLOBIN A1C
Hgb A1c MFr Bld: 9.1 % of total Hgb — ABNORMAL HIGH (ref <5.7)
Mean Plasma Glucose: 214 (calc)
eAG (mmol/L): 11.9 (calc)

## 2019-04-02 LAB — TSH: TSH: 1.29 mIU/L

## 2019-04-02 LAB — VITAMIN D 25 HYDROXY (VIT D DEFICIENCY, FRACTURES): Vit D, 25-Hydroxy: 22 ng/mL — ABNORMAL LOW (ref 30–100)

## 2019-04-06 ENCOUNTER — Ambulatory Visit: Payer: Medicare Other | Admitting: "Endocrinology

## 2019-04-06 ENCOUNTER — Other Ambulatory Visit: Payer: Self-pay

## 2019-04-06 ENCOUNTER — Encounter: Payer: Self-pay | Admitting: "Endocrinology

## 2019-04-06 ENCOUNTER — Ambulatory Visit (INDEPENDENT_AMBULATORY_CARE_PROVIDER_SITE_OTHER): Payer: Medicare Other | Admitting: "Endocrinology

## 2019-04-06 DIAGNOSIS — E039 Hypothyroidism, unspecified: Secondary | ICD-10-CM

## 2019-04-06 DIAGNOSIS — Z794 Long term (current) use of insulin: Secondary | ICD-10-CM

## 2019-04-06 DIAGNOSIS — I10 Essential (primary) hypertension: Secondary | ICD-10-CM

## 2019-04-06 DIAGNOSIS — E1165 Type 2 diabetes mellitus with hyperglycemia: Secondary | ICD-10-CM

## 2019-04-06 DIAGNOSIS — IMO0002 Reserved for concepts with insufficient information to code with codable children: Secondary | ICD-10-CM

## 2019-04-06 DIAGNOSIS — E118 Type 2 diabetes mellitus with unspecified complications: Secondary | ICD-10-CM

## 2019-04-06 DIAGNOSIS — E782 Mixed hyperlipidemia: Secondary | ICD-10-CM

## 2019-04-06 MED ORDER — TRESIBA FLEXTOUCH 200 UNIT/ML ~~LOC~~ SOPN: 70.0000 [IU] | PEN_INJECTOR | Freq: Every day | SUBCUTANEOUS | 2 refills | Status: DC

## 2019-04-06 MED ORDER — LEVOTHYROXINE SODIUM 125 MCG PO TABS: ORAL_TABLET | ORAL | 1 refills | Status: DC

## 2019-04-06 MED ORDER — LISINOPRIL 20 MG PO TABS: 20.0000 mg | ORAL_TABLET | Freq: Every day | ORAL | 1 refills | Status: DC

## 2019-04-06 MED ORDER — ATORVASTATIN CALCIUM 20 MG PO TABS: ORAL_TABLET | ORAL | 1 refills | Status: DC

## 2019-04-06 MED ORDER — HUMALOG KWIKPEN 200 UNIT/ML ~~LOC~~ SOPN: 25.0000 [IU] | PEN_INJECTOR | Freq: Three times a day (TID) | SUBCUTANEOUS | 1 refills | Status: DC

## 2019-04-06 NOTE — Progress Notes (Signed)
04/06/2019                                                    Endocrinology Telehealth Visit Follow up Note -During COVID -19 Pandemic  This visit type was conducted due to national recommendations for restrictions regarding the COVID-19 Pandemic  in an effort to limit this patient's exposure and mitigate transmission of the corona virus.  Due to her co-morbid illnesses, Michelle Skinner is at   high risk for complications without adequate follow up.  This format is felt to be most appropriate for her at this time.  I connected with this patient on 04/06/2019   by telephone and verified that I am speaking with the correct person using two identifiers. Michelle Skinner, Mar 17, 1966. she has verbally consented to this visit. All issues noted in this document were discussed and addressed. The format was not optimal for physical exam.    Subjective:    Patient ID: Michelle Skinner, female    DOB: Oct 01, 1965,    Past Medical History:  Diagnosis Date  . Anemia   . Cervical cancer ([place])   . Depression   . Diabetes mellitus, type II ([place])   . GERD (gastroesophageal reflux disease)   . Hyperlipidemia   . Neuropathy   . PTSD (post-traumatic stress disorder)    Past Surgical History:  Procedure Laterality Date  . ABDOMINAL HYSTERECTOMY    . COLONOSCOPY WITH PROPOFOL N/A 05/25/2018   Procedure: COLONOSCOPY WITH PROPOFOL;  Surgeon: Daneil Dolin, MD;  Location: [facility];  Service: Endoscopy;  Laterality: N/A;  7:30am  . OTHER SURGICAL HISTORY Right    R Foot- I&D  . POLYPECTOMY  05/25/2018   Procedure: POLYPECTOMY;  Surgeon: Daneil Dolin, MD;  Location: [facility];  Service: Endoscopy;;  colon  . UMBILICAL HERNIA REPAIR  2007   Social History   Socioeconomic History  . Marital status: Married    Spouse name: Not on file  . Number of children: Not on file  . Years of education: Not on file  . Highest education level: Not on file  Occupational History  . Not on file  Social Needs   . Financial resource strain: Not on file  . Food insecurity    Worry: Not on file    Inability: Not on file  . Transportation needs    Medical: Not on file    Non-medical: Not on file  Tobacco Use  . Smoking status: Never Smoker  . Smokeless tobacco: Never Used  Substance and Sexual Activity  . Alcohol use: No    Alcohol/week: 0.0 standard drinks  . Drug use: No  . Sexual activity: Yes    Birth control/protection: Surgical  Lifestyle  . Physical activity    Days per week: Not on file    Minutes per session: Not on file  . Stress: Not on file  Relationships  . Social Herbalist on phone: Not on file    Gets together: Not on file    Attends religious service: Not on file    Active member of club or organization: Not on file    Attends meetings of clubs or organizations: Not on file    Relationship status: Not on file  Other Topics Concern  . Not on file  Social History  Narrative  . Not on file   Outpatient Encounter Medications as of 04/06/2019  Medication Sig  . carvedilol (COREG) 25 MG tablet Take 25 mg by mouth 2 (two) times daily with a meal.  . atorvastatin (LIPITOR) 20 MG tablet TAKE 1 TABLET BY MOUTH  DAILY AT 6 PM.  . Biotin (BIOTIN 5000) 5 MG CAPS Take 5 mg by mouth daily.  . Blood Glucose Monitoring Suppl (ONE TOUCH ULTRA 2) w/Device KIT Use to test blood glucose  . CALCIUM PO Take 2 each by mouth daily.   . Cholecalciferol (VITAMIN D3) 125 MCG (5000 UT) CAPS Take 1 capsule (5,000 Units total) by mouth daily.  Marland Kitchen glucose blood (ONE TOUCH ULTRA TEST) test strip USE TO TEST BLOOD GLUCOSE 4 x daily E11.65  . Insulin Lispro (HUMALOG KWIKPEN) 200 UNIT/ML SOPN Inject 25-31 Units into the skin 3 (three) times daily before meals.  Marland Kitchen levothyroxine (SYNTHROID) 125 MCG tablet TAKE ONE (1) TABLET EACH DAY  . lisinopril (ZESTRIL) 20 MG tablet Take 1 tablet (20 mg total) by mouth daily.  . Multiple Vitamin (MULTIVITAMIN) tablet Take 1 tablet by mouth daily.  Marland Kitchen  omeprazole (PRILOSEC) 20 MG capsule Take 1 capsule (20 mg total) by mouth 2 (two) times daily before a meal. For 10 days while on Pylera for H.pylori  . pantoprazole (PROTONIX) 40 MG tablet TAKE ONE (1) TABLET EACH DAY  . pregabalin (LYRICA) 75 MG capsule Take 75 mg by mouth daily as needed (for pain).   . [DISCONTINUED] atorvastatin (LIPITOR) 20 MG tablet TAKE 1 TABLET BY MOUTH  DAILY AT 6 PM.  . [DISCONTINUED] Insulin Degludec (TRESIBA FLEXTOUCH) 200 UNIT/ML SOPN Inject 80 Units into the skin at bedtime.  . [DISCONTINUED] Insulin Degludec (TRESIBA FLEXTOUCH) 200 UNIT/ML SOPN Inject 70 Units into the skin at bedtime.  . [DISCONTINUED] Insulin Lispro (HUMALOG KWIKPEN) 200 UNIT/ML SOPN Inject 25-31 Units into the skin 3 (three) times daily before meals.  . [DISCONTINUED] levothyroxine (SYNTHROID) 125 MCG tablet TAKE ONE (1) TABLET EACH DAY  . [DISCONTINUED] lisinopril (PRINIVIL,ZESTRIL) 20 MG tablet Take 1 tablet (20 mg total) by mouth daily.   No facility-administered encounter medications on file as of 04/06/2019.    ALLERGIES: Allergies  Allergen Reactions  . Sulfa Antibiotics Rash  . Bactrim [Sulfamethoxazole-Trimethoprim] Itching  . Cherry Other (See Comments)    Unknown  . Gabapentin Other (See Comments)    Unknown  . Invokana [Canagliflozin] Other (See Comments)    Unknown   VACCINATION STATUS:  There is no immunization history on file for this patient.  Diabetes She presents for her follow-up diabetic visit. She has type 2 diabetes mellitus. Onset time: She was diagnosed at approximate age of 83 years. Her disease course has been improving. There are no hypoglycemic associated symptoms. Pertinent negatives for hypoglycemia include no confusion, headaches, pallor or seizures. Associated symptoms include fatigue, polydipsia and polyuria. Pertinent negatives for diabetes include no chest pain and no polyphagia. There are no hypoglycemic complications. Symptoms are improving. Diabetic  complications include peripheral neuropathy and retinopathy. Pertinent negatives for diabetic complications include no autonomic neuropathy. Risk factors for coronary artery disease include diabetes mellitus, dyslipidemia, hypertension, obesity and sedentary lifestyle. She is compliant with treatment most of the time. Her weight is increasing steadily. She is following a generally unhealthy diet. She has not had a previous visit with a dietitian (She missed her appointment.). She never participates in exercise. Her home blood glucose trend is fluctuating minimally. Her breakfast blood glucose range is  generally 180-200 mg/dl. Her lunch blood glucose range is generally >200 mg/dl. Her dinner blood glucose range is generally >200 mg/dl. Her bedtime blood glucose range is generally >200 mg/dl. Her overall blood glucose range is >200 mg/dl. (She returns with slightly better glycemic profile and A1c of 9.4%.  She did not tolerate Tresiba 100 units nightly, lowered it to 90 units.) An ACE inhibitor/angiotensin II receptor blocker is not being taken. Eye exam is current.  Hyperlipidemia This is a chronic problem. The current episode started more than 1 year ago. The problem is uncontrolled. Recent lipid tests were reviewed and are high. Exacerbating diseases include diabetes, hypothyroidism and obesity. Pertinent negatives include no chest pain, myalgias or shortness of breath. She is currently on no antihyperlipidemic treatment.  Thyroid Problem Presents for initial visit. Symptoms include fatigue. Patient reports no cold intolerance, diarrhea, heat intolerance or palpitations. The following procedures have not been performed: radioiodine uptake scan and thyroidectomy. Her past medical history is significant for diabetes and hyperlipidemia.   Review of systems:  Limited as above.   Objective:    There were no vitals taken for this visit.  Wt Readings from Last 3 Encounters:  08/26/18 (!) 363 lb (164.7 kg)   05/18/18 (!) 352 lb (159.7 kg)  05/13/18 (!) 352 lb (159.7 kg)     Results for orders placed or performed in visit on 12/04/18  Hemoglobin A1c  Result Value Ref Range   Hgb A1c MFr Bld 9.1 (H) <5.7 % of total Hgb   Mean Plasma Glucose 214 (calc)   eAG (mmol/L) 11.9 (calc)  COMPLETE METABOLIC PANEL WITH GFR  Result Value Ref Range   Glucose, Bld 246 (H) 65 - 99 mg/dL   BUN 17 7 - 25 mg/dL   Creat 1.08 (H) 0.50 - 1.05 mg/dL   GFR, Est Non African American 59 (L) > OR = 60 mL/min/1.16m   GFR, Est African American 69 > OR = 60 mL/min/1.711m  BUN/Creatinine Ratio 16 6 - 22 (calc)   Sodium 143 135 - 146 mmol/L   Potassium 4.8 3.5 - 5.3 mmol/L   Chloride 105 98 - 110 mmol/L   CO2 31 20 - 32 mmol/L   Calcium 9.1 8.6 - 10.4 mg/dL   Total Protein 6.1 6.1 - 8.1 g/dL   Albumin 3.2 (L) 3.6 - 5.1 g/dL   Globulin 2.9 1.9 - 3.7 g/dL (calc)   AG Ratio 1.1 1.0 - 2.5 (calc)   Total Bilirubin 0.6 0.2 - 1.2 mg/dL   Alkaline phosphatase (APISO) 94 37 - 153 U/L   AST 13 10 - 35 U/L   ALT 16 6 - 29 U/L  TSH  Result Value Ref Range   TSH 1.29 mIU/L  T4, free  Result Value Ref Range   Free T4 1.5 0.8 - 1.8 ng/dL  VITAMIN D 25 Hydroxy (Vit-D Deficiency, Fractures)  Result Value Ref Range   Vit D, 25-Hydroxy 22 (L) 30 - 100 ng/mL   Diabetic Labs (most recent): Lab Results  Component Value Date   HGBA1C 9.1 (H) 04/01/2019   HGBA1C 8.9 (H) 11/18/2018   HGBA1C 9.4 (H) 08/17/2018    Lipid Panel     Component Value Date/Time   CHOL 175 11/18/2018 0833   TRIG 143 11/18/2018 0833   HDL 40 (L) 11/18/2018 0833   CHOLHDL 4.4 11/18/2018 0833   VLDL 42 (H) 04/25/2015 1001   LDLCALC 109 (H) 11/18/2018 0833     Assessment & Plan:   1.  Uncontrolled type 2 diabetes mellitus with complication, with long-term current use of insulin (HCC)   Her diabetes is complicated by bilateral peripheral neuropathy , lateral Charcot's feet, history of noncompliance/nonadherence, and possible  retinopathy. - She has  uncontrolled symptomatic type 2 DM of approximately since age 2 years duration. - She reports fasting blood glucose significantly fluctuating between 42-2 94, with 6 episodes of hypoglycemia in the last 10 days, prelunch between 91-244, presupper between 237-389, bedtime between 186-308.  Her previsit labs show A1c of 9.1%,  -Her morning hypoglycemia is largely due to late breakfast because she sleeps late in the morning. -Some days she does not eat breakfast until 10 or 11 AM.  -She remains at extremely high risk for more acute and chronic complications of diabetes which include CAD, CVA, CKD, retinopathy, and neuropathy. These are all discussed in detail with the patient.   - I have counseled the patient on diet management and weight loss, by adopting a carbohydrate restricted/protein rich diet.  - Patient is advised to stick to a routine mealtimes to eat 3 meals  a day and avoid unnecessary snacks ( to snack only to correct hypoglycemia).   - she  admits there is a room for improvement in her diet and drink choices. -  Suggestion is made for her to avoid simple carbohydrates  from her diet including Cakes, Sweet Desserts / Pastries, Ice Cream, Soda (diet and regular), Sweet Tea, Candies, Chips, Cookies, Sweet Pastries,  Store Bought Juices, Alcohol in Excess of  1-2 drinks a day, Artificial Sweeteners, Coffee Creamer, and "Sugar-free" Products. This will help patient to have stable blood glucose profile and potentially avoid unintended weight gain.   - I encouraged the patient to switch to  unprocessed or minimally processed complex starch and increased protein intake (animal or plant source), fruits, and vegetables.   - I have approached patient with the following individualized plan to manage diabetes and patient agrees:   -She will continue to require intensive treatment with basal/bolus insulin at higher dose in order for her to achieve and maintain control of  diabetes to target.    -Due to fasting hypoglycemia, she is advised to lower her Tresiba to 70 units nightly, continue Humalog 25 units  (plus correction) 3 times daily AC for pe-meal blood glucose above 90 mg/dL, associated with strict monitoring of blood glucose 4 times a day-before meals and at bedtime. -She is currently using the libre CGM device, advised to wear it at all times.  - She is warned not to take insulin without monitoring blood glucose properly. -She is given individualized chart to correct hyperglycemia above 150 mg/dL. - She will be considered for insulin U500 if she continues to require greater than 200 units of insulin U100 during her next visit.    -Patient is encouraged to call clinic for blood glucose levels less than 70 or above 300 mg /dl. - she is adamant saying that she does not tolerate MTF, Invokana, and [place].  - Patient specific target  A1c;  LDL, HDL, Triglycerides, and  Waist Circumference were discussed in detail.  2) BP/HTN: she is advised to home monitor blood pressure and report if > 140/90 on 2 separate readings.   She is advised to take her blood pressure medications in the morning including lisinopril 20 mg p.o. daily, carvedilol 25 mg p.o. twice daily, I refilled her  prescriptions.    3) Lipids/HPL: Most recent lipid panel showed LDL of 109, uncontrolled.  She is  advised to continue Lipitor 20 mg p.o. nightly.     4) vitamin D deficiency:  -She is on ongoing supplement with vitamin D3 5000 units daily.    5)  Weight/Diet: she keeps missing her appointment with  CDE exercise, and carbohydrates information provided.   6) hypothyroidism:  -Her previsit thyroid function tests are consistent with appropriate replacement. -She is advised to continue  levothyroxine 125 mcg p.o. daily before breakfast.    - We discussed about the correct intake of her thyroid hormone, on empty stomach at fasting, with water, separated by at least 30 minutes from  breakfast and other medications,  and separated by more than 4 hours from calcium, iron, multivitamins, acid reflux medications (PPIs). -Patient is made aware of the fact that thyroid hormone replacement is needed for life, dose to be adjusted by periodic monitoring of thyroid function tests.   6) Chronic Care/Health Maintenance:  -Patient is initiated on  Statin medications. She is encouraged to continue to follow up with Ophthalmology, Podiatrist at least yearly or according to recommendations, and advised to stay away from smoking. I have recommended yearly flu vaccine and pneumonia vaccination at least every 5 years; moderate intensity exercise for up to 150 minutes weekly; and  sleep for at least 7 hours a day.  -Given her significant peripheral neuropathy and bilateral Charcot's feet, she will benefit from continued supply of diabetic shoes.  I advised patient to maintain close follow up with their PCP for primary care needs.   - Patient Care Time Today:  25 min, of which >50% was spent in  counseling and the rest reviewing her  current and  previous labs/studies, previous treatments, her blood glucose readings, and medications' doses and developing a plan for long-term care based on the latest recommendations for standards of care.   Michelle Skinner in the discussions, expressed understanding, and voiced agreement with the above plans.  All questions were answered to her satisfaction. she is encouraged to contact clinic should she have any questions or concerns prior to her return visit.   Follow up plan: Return in about 3 months (around 07/06/2019) for Bring Meter and Logs- A1c in Office, Include 8 log sheets.  Glade Lloyd, MD Phone: [phone]  Fax: [phone]  -  This note was partially dictated with voice recognition software. Similar sounding words can be transcribed inadequately or may not  be corrected upon review.  04/06/2019, 10:21 AM

## 2019-07-12 ENCOUNTER — Other Ambulatory Visit: Payer: Self-pay

## 2019-07-12 ENCOUNTER — Ambulatory Visit (INDEPENDENT_AMBULATORY_CARE_PROVIDER_SITE_OTHER): Payer: Medicare Other | Admitting: "Endocrinology

## 2019-07-12 ENCOUNTER — Encounter: Payer: Self-pay | Admitting: "Endocrinology

## 2019-07-12 VITALS — BP 159/86 | HR 72 | Ht 67.5 in | Wt 375.0 lb

## 2019-07-12 DIAGNOSIS — E039 Hypothyroidism, unspecified: Secondary | ICD-10-CM | POA: Diagnosis not present

## 2019-07-12 DIAGNOSIS — Z794 Long term (current) use of insulin: Secondary | ICD-10-CM | POA: Diagnosis not present

## 2019-07-12 DIAGNOSIS — I10 Essential (primary) hypertension: Secondary | ICD-10-CM

## 2019-07-12 DIAGNOSIS — IMO0002 Reserved for concepts with insufficient information to code with codable children: Secondary | ICD-10-CM

## 2019-07-12 DIAGNOSIS — E782 Mixed hyperlipidemia: Secondary | ICD-10-CM | POA: Diagnosis not present

## 2019-07-12 DIAGNOSIS — E118 Type 2 diabetes mellitus with unspecified complications: Secondary | ICD-10-CM | POA: Diagnosis not present

## 2019-07-12 DIAGNOSIS — E559 Vitamin D deficiency, unspecified: Secondary | ICD-10-CM

## 2019-07-12 DIAGNOSIS — E1165 Type 2 diabetes mellitus with hyperglycemia: Secondary | ICD-10-CM | POA: Diagnosis not present

## 2019-07-12 LAB — POCT GLYCOSYLATED HEMOGLOBIN (HGB A1C): Hemoglobin A1C: 10.7 % — AB (ref 4.0–5.6)

## 2019-07-12 MED ORDER — ATORVASTATIN CALCIUM 20 MG PO TABS: ORAL_TABLET | ORAL | 1 refills | Status: DC

## 2019-07-12 MED ORDER — TRESIBA FLEXTOUCH 200 UNIT/ML ~~LOC~~ SOPN: 90.0000 [IU] | PEN_INJECTOR | Freq: Every day | SUBCUTANEOUS | 2 refills | Status: DC

## 2019-07-12 MED ORDER — LISINOPRIL 20 MG PO TABS: 20.0000 mg | ORAL_TABLET | Freq: Every day | ORAL | 1 refills | Status: DC

## 2019-07-12 MED ORDER — CARVEDILOL 25 MG PO TABS: 25.0000 mg | ORAL_TABLET | Freq: Two times a day (BID) | ORAL | 1 refills | Status: DC

## 2019-07-12 NOTE — Patient Instructions (Signed)

## 2019-07-12 NOTE — Progress Notes (Signed)
07/12/2019                                                    Endocrinology Telehealth Visit Follow up Note -During COVID -19 Pandemic  This visit type was conducted due to national recommendations for restrictions regarding the COVID-19 Pandemic  in an effort to limit this patient's exposure and mitigate transmission of the corona virus.  Due to her co-morbid illnesses, Michelle Skinner is at   high risk for complications without adequate follow up.  This format is felt to be most appropriate for her at this time.  I connected with this patient on 07/12/2019   by telephone and verified that I am speaking with the correct person using two identifiers. Michelle Skinner, 01-30-1966. she has verbally consented to this visit. All issues noted in this document were discussed and addressed. The format was not optimal for physical exam.    Subjective:    Patient ID: Michelle Skinner, female    DOB: 04/14/1966,    Past Medical History:  Diagnosis Date  . Anemia   . Cervical cancer ([place])   . Depression   . Diabetes mellitus, type II ([place])   . GERD (gastroesophageal reflux disease)   . Hyperlipidemia   . Neuropathy   . PTSD (post-traumatic stress disorder)    Past Surgical History:  Procedure Laterality Date  . ABDOMINAL HYSTERECTOMY    . COLONOSCOPY WITH PROPOFOL N/A 05/25/2018   Procedure: COLONOSCOPY WITH PROPOFOL;  Surgeon: Daneil Dolin, MD;  Location: [facility];  Service: Endoscopy;  Laterality: N/A;  7:30am  . OTHER SURGICAL HISTORY Right    R Foot- I&D  . POLYPECTOMY  05/25/2018   Procedure: POLYPECTOMY;  Surgeon: Daneil Dolin, MD;  Location: [facility];  Service: Endoscopy;;  colon  . UMBILICAL HERNIA REPAIR  2007   Social History   Socioeconomic History  . Marital status: Married    Spouse name: Not on file  . Number of children: Not on file  . Years of education: Not on file  . Highest education level: Not on file  Occupational History  . Not on file  Tobacco Use  .  Smoking status: Never Smoker  . Smokeless tobacco: Never Used  Substance and Sexual Activity  . Alcohol use: No    Alcohol/week: 0.0 standard drinks  . Drug use: No  . Sexual activity: Yes    Birth control/protection: Surgical  Other Topics Concern  . Not on file  Social History Narrative  . Not on file   Social Determinants of Health   Financial Resource Strain:   . Difficulty of Paying Living Expenses: Not on file  Food Insecurity:   . Worried About Charity fundraiser in the Last Year: Not on file  . Ran Out of Food in the Last Year: Not on file  Transportation Needs:   . Lack of Transportation (Medical): Not on file  . Lack of Transportation (Non-Medical): Not on file  Physical Activity:   . Days of Exercise per Week: Not on file  . Minutes of Exercise per Session: Not on file  Stress:   . Feeling of Stress : Not on file  Social Connections:   . Frequency of Communication with Friends and Family: Not on file  . Frequency of Social Gatherings with  Friends and Family: Not on file  . Attends Religious Services: Not on file  . Active Member of Clubs or Organizations: Not on file  . Attends Archivist Meetings: Not on file  . Marital Status: Not on file   Outpatient Encounter Medications as of 07/12/2019  Medication Sig  . Continuous Blood Gluc Receiver (FREESTYLE LIBRE 14 DAY READER) DEVI by Does not apply route 4 (four) times daily.  Marland Kitchen atorvastatin (LIPITOR) 20 MG tablet TAKE 1 TABLET BY MOUTH  DAILY AT 6 PM.  . Biotin (BIOTIN 5000) 5 MG CAPS Take 5 mg by mouth daily.  Marland Kitchen CALCIUM PO Take 2 each by mouth daily.   . carvedilol (COREG) 25 MG tablet Take 1 tablet (25 mg total) by mouth 2 (two) times daily with a meal.  . Cholecalciferol (VITAMIN D3) 125 MCG (5000 UT) CAPS Take 1 capsule (5,000 Units total) by mouth daily.  . Insulin Degludec (TRESIBA FLEXTOUCH) 200 UNIT/ML SOPN Inject 90 Units into the skin at bedtime.  . Insulin Lispro (HUMALOG KWIKPEN) 200 UNIT/ML  SOPN Inject 25-31 Units into the skin 3 (three) times daily before meals.  Marland Kitchen levothyroxine (SYNTHROID) 125 MCG tablet TAKE ONE (1) TABLET EACH DAY  . lisinopril (ZESTRIL) 20 MG tablet Take 1 tablet (20 mg total) by mouth daily.  . metoprolol succinate (TOPROL-XL) 100 MG 24 hr tablet Take 100 mg by mouth daily.  . Multiple Vitamin (MULTIVITAMIN) tablet Take 1 tablet by mouth daily.  Marland Kitchen omeprazole (PRILOSEC) 20 MG capsule Take 1 capsule (20 mg total) by mouth 2 (two) times daily before a meal. For 10 days while on Pylera for H.pylori  . pantoprazole (PROTONIX) 40 MG tablet TAKE ONE (1) TABLET EACH DAY  . pregabalin (LYRICA) 75 MG capsule Take 75 mg by mouth daily as needed (for pain).   . [DISCONTINUED] atorvastatin (LIPITOR) 20 MG tablet TAKE 1 TABLET BY MOUTH  DAILY AT 6 PM.  . [DISCONTINUED] Blood Glucose Monitoring Suppl (ONE TOUCH ULTRA 2) w/Device KIT Use to test blood glucose  . [DISCONTINUED] carvedilol (COREG) 25 MG tablet Take 25 mg by mouth 2 (two) times daily with a meal.  . [DISCONTINUED] glucose blood (ONE TOUCH ULTRA TEST) test strip USE TO TEST BLOOD GLUCOSE 4 x daily E11.65  . [DISCONTINUED] Insulin Degludec (TRESIBA FLEXTOUCH) 200 UNIT/ML SOPN Inject 70 Units into the skin at bedtime.  . [DISCONTINUED] lisinopril (ZESTRIL) 20 MG tablet Take 1 tablet (20 mg total) by mouth daily.   No facility-administered encounter medications on file as of 07/12/2019.   ALLERGIES: Allergies  Allergen Reactions  . Sulfa Antibiotics Rash  . Bactrim [Sulfamethoxazole-Trimethoprim] Itching  . Cherry Other (See Comments)    Unknown  . Gabapentin Other (See Comments)    Unknown  . Invokana [Canagliflozin] Other (See Comments)    Unknown   VACCINATION STATUS:  There is no immunization history on file for this patient.  Diabetes She presents for her follow-up diabetic visit. She has type 2 diabetes mellitus. Onset time: She was diagnosed at approximate age of 53 years. Her disease course has  been worsening. There are no hypoglycemic associated symptoms. Pertinent negatives for hypoglycemia include no confusion, headaches, pallor or seizures. Associated symptoms include fatigue, polydipsia and polyuria. Pertinent negatives for diabetes include no chest pain and no polyphagia. There are no hypoglycemic complications. Symptoms are worsening. Diabetic complications include peripheral neuropathy and retinopathy. Pertinent negatives for diabetic complications include no autonomic neuropathy. Risk factors for coronary artery disease include diabetes mellitus,  dyslipidemia, hypertension, obesity and sedentary lifestyle. She is compliant with treatment most of the time. Her weight is increasing steadily. She is following a generally unhealthy diet. She has not had a previous visit with a dietitian (She missed her appointment.). She never participates in exercise. Her home blood glucose trend is fluctuating minimally. Her breakfast blood glucose range is generally >200 mg/dl. Her lunch blood glucose range is generally >200 mg/dl. Her dinner blood glucose range is generally >200 mg/dl. Her bedtime blood glucose range is generally >200 mg/dl. Her overall blood glucose range is >200 mg/dl. (She returns with worsening glycemic profile.  Review of her CGM device and printout shows she is in target range only 37% of the times, above target 63% of the time.  She has no hypoglycemia.  She does not follow the routine meal timing suggested.  Her average blood glucose is 218 for the last 15 days.  Her point-of-care A1c is 10.7%.  ) An ACE inhibitor/angiotensin II receptor blocker is not being taken. Eye exam is current.  Hyperlipidemia This is a chronic problem. The current episode started more than 1 year ago. The problem is uncontrolled. Recent lipid tests were reviewed and are high. Exacerbating diseases include diabetes, hypothyroidism and obesity. Pertinent negatives include no chest pain, myalgias or shortness of  breath. She is currently on no antihyperlipidemic treatment.  Thyroid Problem Presents for initial visit. Symptoms include fatigue. Patient reports no cold intolerance, diarrhea, heat intolerance or palpitations. The following procedures have not been performed: radioiodine uptake scan and thyroidectomy. Her past medical history is significant for diabetes and hyperlipidemia.   Constitutional: + weight gain, + fatigue, no subjective hyperthermia, no subjective hypothermia Eyes: no blurry vision, no xerophthalmia ENT: no sore throat, no nodules palpated in throat, no dysphagia/odynophagia, no hoarseness Cardiovascular: no Chest Pain, no Shortness of Breath, no palpitations, no leg swelling Respiratory: no cough, no SOB Gastrointestinal: no Nausea/Vomiting/Diarhhea Musculoskeletal: no muscle/joint aches Skin: no rashes Neurological: no tremors, no numbness, no tingling, no dizziness Psychiatric: no depression, no anxiety   Objective:    BP (!) 159/86   Pulse 72   Ht 5' 7.5" (1.715 m)   Wt (!) 375 lb (170.1 kg)   BMI 57.87 kg/m   Wt Readings from Last 3 Encounters:  07/12/19 (!) 375 lb (170.1 kg)  08/26/18 (!) 363 lb (164.7 kg)  05/18/18 (!) 352 lb (159.7 kg)     Physical Exam- Limited  Constitutional:  Body mass index is 57.87 kg/m. , not in acute distress, normal state of mind Eyes:  EOMI, no exophthalmos Neck: Supple Respiratory: Adequate breathing efforts Musculoskeletal: + Charcot's deformities on bilateral lower extremities,strength intact in all four extremities, no gross restriction of joint movements Skin:  no rashes, no hyperemia Neurological: no tremor with outstretched hands.  Psych: Reluctant affect.    Results for orders placed or performed in visit on 07/12/19  HgB A1c  Result Value Ref Range   Hemoglobin A1C 10.7 (A) 4.0 - 5.6 %   HbA1c POC (<> result, manual entry)     HbA1c, POC (prediabetic range)     HbA1c, POC (controlled diabetic range)      Diabetic Labs (most recent): Lab Results  Component Value Date   HGBA1C 10.7 (A) 07/12/2019   HGBA1C 9.1 (H) 04/01/2019   HGBA1C 8.9 (H) 11/18/2018    Lipid Panel     Component Value Date/Time   CHOL 175 11/18/2018 0833   TRIG 143 11/18/2018 0833   HDL 40 (L)  11/18/2018 0833   CHOLHDL 4.4 11/18/2018 0833   VLDL 42 (H) 04/25/2015 1001   LDLCALC 109 (H) 11/18/2018 0833    Assessment & Plan:   1. Uncontrolled type 2 diabetes mellitus with complication, with long-term current use of insulin ([place])   Her diabetes is complicated by bilateral peripheral neuropathy , lateral Charcot's feet, history of noncompliance/nonadherence, and possible retinopathy. - She has chronically uncontrolled symptomatic type 2 DM of approximately since age 53 years duration. - She presents with her CGM device.  Analysis shows average blood glucose of 218, 63% above target, 37% within range.    -Her point-of-care A1c is 10.7% increasing from 9.1%.   -She remains at extremely high risk for more acute and chronic complications of diabetes which include CAD, CVA, CKD, retinopathy, and neuropathy. These are all discussed in detail with the patient.   - I have counseled the patient on diet management and weight loss, by adopting a carbohydrate restricted/protein rich diet.  - Patient is advised to stick to a routine mealtimes to eat 3 meals  a day and avoid unnecessary snacks ( to snack only to correct hypoglycemia).   - she  admits there is a room for improvement in her diet and drink choices. -  Suggestion is made for her to avoid simple carbohydrates  from her diet including Cakes, Sweet Desserts / Pastries, Ice Cream, Soda (diet and regular), Sweet Tea, Candies, Chips, Cookies, Sweet Pastries,  Store Bought Juices, Alcohol in Excess of  1-2 drinks a day, Artificial Sweeteners, Coffee Creamer, and "Sugar-free" Products. This will help patient to have stable blood glucose profile and potentially avoid  unintended weight gain.   - I encouraged the patient to switch to  unprocessed or minimally processed complex starch and increased protein intake (animal or plant source), fruits, and vegetables.   - I have approached patient with the following individualized plan to manage diabetes and patient agrees:   -She will continue to require intensive treatment with basal/bolus insulin at higher dose in order for her to achieve and maintain control of diabetes to target.    -She is advised to increase her Tresiba to 90 units nightly, reports she did not tolerate any higher dose before.   -She is advised to continue Humalog  25 units  (plus correction) 3 times daily AC for pe-meal blood glucose above 90 mg/dL, associated with strict monitoring of blood glucose 4 times a day-before meals and at bedtime. -She is currently using the libre CGM device, advised to wear it at all times.  - She is warned not to take insulin without monitoring blood glucose properly. -She is given individualized chart to correct hyperglycemia above 150 mg/dL. - She will be considered for insulin U500 if she continues to require greater than 200 units of insulin U100 during her next visit.    -Patient is encouraged to call clinic for blood glucose levels less than 70 or above 300 mg /dl. - she is adamant saying that she does not tolerate MTF, SGLT2 inhibitors,  And GLP-1 receptor agonist including [place].  Interpretation of CGM printouts discussed with her. - Patient specific target  A1c;  LDL, HDL, Triglycerides, and  Waist Circumference were discussed in detail.  2) BP/HTN: Her blood pressure is not controlled to target.   She is advised to take her blood pressure medications in the morning including lisinopril 20 mg p.o. daily, carvedilol 25 mg p.o. twice daily, I refilled her  prescriptions.    3) Lipids/HPL:  Most recent lipid panel showed LDL of 109, uncontrolled.  She is advised to continue Lipitor 20 mg p.o. nightly.      4) vitamin D deficiency:  -She is on ongoing supplement with vitamin D3 5000 units daily.    5)  Weight/Diet: Her BMI is 57.8.  Clearly this is complicating her care for diabetes.  She cannot exercise optimally.  She keeps missing her appointment with  CDE exercise, and carbohydrates information provided.   6) hypothyroidism:  -She is advised to continue  levothyroxine 125 mcg p.o. daily before breakfast.    - We discussed about the correct intake of her thyroid hormone, on empty stomach at fasting, with water, separated by at least 30 minutes from breakfast and other medications,  and separated by more than 4 hours from calcium, iron, multivitamins, acid reflux medications (PPIs). -Patient is made aware of the fact that thyroid hormone replacement is needed for life, dose to be adjusted by periodic monitoring of thyroid function tests.  6) Chronic Care/Health Maintenance:  -Patient is initiated on  Statin medications. She is encouraged to continue to follow up with Ophthalmology, Podiatrist at least yearly or according to recommendations, and advised to stay away from smoking. I have recommended yearly flu vaccine and pneumonia vaccination at least every 5 years; moderate intensity exercise for up to 150 minutes weekly; and  sleep for at least 7 hours a day.  -Given her significant peripheral neuropathy and bilateral Charcot's feet, she will benefit from continued supply of diabetic shoes.  I advised patient to maintain close follow up with their PCP for primary care needs.  - Time spent on this patient care encounter:  35 min, of which > 50% was spent in  counseling and the rest reviewing her blood glucose logs , discussing her hypoglycemia and hyperglycemia episodes, reviewing her current and  previous labs / studies  ( including abstraction from other facilities) and medications  doses and developing a  long term treatment plan and documenting her care.   Please refer to Patient  Instructions for Blood Glucose Monitoring and Insulin/Medications Dosing Guide"  in media tab for additional information. Please  also refer to " Patient Self Inventory" in the Media  tab for reviewed elements of pertinent patient history.  Michelle Skinner participated in the discussions, expressed understanding, and voiced agreement with the above plans.  All questions were answered to her satisfaction. she is encouraged to contact clinic should she have any questions or concerns prior to her return visit.  Follow up plan: Return in about 3 months (around 10/10/2019) for Follow up with Pre-visit Labs, Next Visit A1c in Office.  Glade Lloyd, MD Phone: [phone]  Fax: [phone]  -  This note was partially dictated with voice recognition software. Similar sounding words can be transcribed inadequately or may not  be corrected upon review.  07/12/2019, 5:26 PM

## 2019-08-08 DIAGNOSIS — E119 Type 2 diabetes mellitus without complications: Secondary | ICD-10-CM | POA: Diagnosis not present

## 2019-08-08 DIAGNOSIS — Z794 Long term (current) use of insulin: Secondary | ICD-10-CM | POA: Diagnosis not present

## 2019-09-05 DIAGNOSIS — Z794 Long term (current) use of insulin: Secondary | ICD-10-CM | POA: Diagnosis not present

## 2019-09-05 DIAGNOSIS — E119 Type 2 diabetes mellitus without complications: Secondary | ICD-10-CM | POA: Diagnosis not present

## 2019-09-13 ENCOUNTER — Encounter: Payer: Self-pay | Admitting: "Endocrinology

## 2019-09-13 DIAGNOSIS — E113313 Type 2 diabetes mellitus with moderate nonproliferative diabetic retinopathy with macular edema, bilateral: Secondary | ICD-10-CM | POA: Diagnosis not present

## 2019-09-13 DIAGNOSIS — H524 Presbyopia: Secondary | ICD-10-CM | POA: Diagnosis not present

## 2019-09-13 DIAGNOSIS — H5203 Hypermetropia, bilateral: Secondary | ICD-10-CM | POA: Diagnosis not present

## 2019-09-13 DIAGNOSIS — H5231 Anisometropia: Secondary | ICD-10-CM | POA: Diagnosis not present

## 2019-09-13 DIAGNOSIS — H52223 Regular astigmatism, bilateral: Secondary | ICD-10-CM | POA: Diagnosis not present

## 2019-09-13 LAB — HM DIABETES EYE EXAM

## 2019-10-11 ENCOUNTER — Ambulatory Visit: Payer: Medicare Other | Admitting: "Endocrinology

## 2019-10-18 DIAGNOSIS — H35033 Hypertensive retinopathy, bilateral: Secondary | ICD-10-CM | POA: Diagnosis not present

## 2019-10-18 DIAGNOSIS — E113513 Type 2 diabetes mellitus with proliferative diabetic retinopathy with macular edema, bilateral: Secondary | ICD-10-CM | POA: Diagnosis not present

## 2019-10-18 DIAGNOSIS — H3582 Retinal ischemia: Secondary | ICD-10-CM | POA: Diagnosis not present

## 2019-10-18 DIAGNOSIS — H43812 Vitreous degeneration, left eye: Secondary | ICD-10-CM | POA: Diagnosis not present

## 2019-10-20 DIAGNOSIS — E039 Hypothyroidism, unspecified: Secondary | ICD-10-CM | POA: Diagnosis not present

## 2019-10-20 DIAGNOSIS — Z794 Long term (current) use of insulin: Secondary | ICD-10-CM | POA: Diagnosis not present

## 2019-10-20 DIAGNOSIS — E1165 Type 2 diabetes mellitus with hyperglycemia: Secondary | ICD-10-CM | POA: Diagnosis not present

## 2019-10-20 DIAGNOSIS — E118 Type 2 diabetes mellitus with unspecified complications: Secondary | ICD-10-CM | POA: Diagnosis not present

## 2019-10-20 DIAGNOSIS — E559 Vitamin D deficiency, unspecified: Secondary | ICD-10-CM | POA: Diagnosis not present

## 2019-10-21 DIAGNOSIS — E7800 Pure hypercholesterolemia, unspecified: Secondary | ICD-10-CM | POA: Diagnosis not present

## 2019-10-21 DIAGNOSIS — E119 Type 2 diabetes mellitus without complications: Secondary | ICD-10-CM | POA: Diagnosis not present

## 2019-10-21 DIAGNOSIS — I10 Essential (primary) hypertension: Secondary | ICD-10-CM | POA: Diagnosis not present

## 2019-10-21 LAB — COMPLETE METABOLIC PANEL WITH GFR
AG Ratio: 1.1 (calc) (ref 1.0–2.5)
ALT: 13 U/L (ref 6–29)
AST: 13 U/L (ref 10–35)
Albumin: 3.4 g/dL — ABNORMAL LOW (ref 3.6–5.1)
Alkaline phosphatase (APISO): 89 U/L (ref 37–153)
BUN/Creatinine Ratio: 17 (calc) (ref 6–22)
BUN: 20 mg/dL (ref 7–25)
CO2: 28 mmol/L (ref 20–32)
Calcium: 9 mg/dL (ref 8.6–10.4)
Chloride: 101 mmol/L (ref 98–110)
Creat: 1.19 mg/dL — ABNORMAL HIGH (ref 0.50–1.05)
GFR, Est African American: 61 mL/min/{1.73_m2} (ref >=60)
GFR, Est Non African American: 52 mL/min/{1.73_m2} — ABNORMAL LOW (ref >=60)
Globulin: 3.2 g/dL (calc) (ref 1.9–3.7)
Glucose, Bld: 425 mg/dL — ABNORMAL HIGH (ref 65–99)
Potassium: 4.6 mmol/L (ref 3.5–5.3)
Sodium: 138 mmol/L (ref 135–146)
Total Bilirubin: 0.5 mg/dL (ref 0.2–1.2)
Total Protein: 6.6 g/dL (ref 6.1–8.1)

## 2019-10-21 LAB — T4, FREE: Free T4: 1.2 ng/dL (ref 0.8–1.8)

## 2019-10-21 LAB — MICROALBUMIN / CREATININE URINE RATIO
Creatinine, Urine: 150 mg/dL (ref 20–275)
Microalb Creat Ratio: 763 mcg/mg creat — ABNORMAL HIGH (ref <30)
Microalb, Ur: 114.5 mg/dL

## 2019-10-21 LAB — VITAMIN D 25 HYDROXY (VIT D DEFICIENCY, FRACTURES): Vit D, 25-Hydroxy: 12 ng/mL — ABNORMAL LOW (ref 30–100)

## 2019-10-21 LAB — TSH: TSH: 8.35 mIU/L — ABNORMAL HIGH

## 2019-10-22 DIAGNOSIS — Z794 Long term (current) use of insulin: Secondary | ICD-10-CM | POA: Diagnosis not present

## 2019-10-22 DIAGNOSIS — E119 Type 2 diabetes mellitus without complications: Secondary | ICD-10-CM | POA: Diagnosis not present

## 2019-10-27 ENCOUNTER — Ambulatory Visit: Payer: Medicare Other | Admitting: "Endocrinology

## 2019-10-27 DIAGNOSIS — Z794 Long term (current) use of insulin: Secondary | ICD-10-CM | POA: Diagnosis not present

## 2019-10-27 DIAGNOSIS — E7800 Pure hypercholesterolemia, unspecified: Secondary | ICD-10-CM | POA: Diagnosis not present

## 2019-10-27 DIAGNOSIS — Z1211 Encounter for screening for malignant neoplasm of colon: Secondary | ICD-10-CM | POA: Diagnosis not present

## 2019-10-27 DIAGNOSIS — Z7189 Other specified counseling: Secondary | ICD-10-CM | POA: Diagnosis not present

## 2019-10-27 DIAGNOSIS — N1831 Chronic kidney disease, stage 3a: Secondary | ICD-10-CM | POA: Diagnosis not present

## 2019-10-27 DIAGNOSIS — I10 Essential (primary) hypertension: Secondary | ICD-10-CM | POA: Diagnosis not present

## 2019-10-27 DIAGNOSIS — L089 Local infection of the skin and subcutaneous tissue, unspecified: Secondary | ICD-10-CM | POA: Diagnosis not present

## 2019-10-27 DIAGNOSIS — Z0000 Encounter for general adult medical examination without abnormal findings: Secondary | ICD-10-CM | POA: Diagnosis not present

## 2019-10-27 DIAGNOSIS — Z299 Encounter for prophylactic measures, unspecified: Secondary | ICD-10-CM | POA: Diagnosis not present

## 2019-10-27 DIAGNOSIS — E119 Type 2 diabetes mellitus without complications: Secondary | ICD-10-CM | POA: Diagnosis not present

## 2019-10-27 DIAGNOSIS — L723 Sebaceous cyst: Secondary | ICD-10-CM | POA: Diagnosis not present

## 2019-10-27 DIAGNOSIS — R5383 Other fatigue: Secondary | ICD-10-CM | POA: Diagnosis not present

## 2019-10-27 DIAGNOSIS — Z79899 Other long term (current) drug therapy: Secondary | ICD-10-CM | POA: Diagnosis not present

## 2019-11-10 DIAGNOSIS — E113511 Type 2 diabetes mellitus with proliferative diabetic retinopathy with macular edema, right eye: Secondary | ICD-10-CM | POA: Diagnosis not present

## 2019-11-15 ENCOUNTER — Ambulatory Visit (INDEPENDENT_AMBULATORY_CARE_PROVIDER_SITE_OTHER): Payer: Medicare Other | Admitting: "Endocrinology

## 2019-11-15 ENCOUNTER — Other Ambulatory Visit: Payer: Self-pay

## 2019-11-15 ENCOUNTER — Encounter: Payer: Self-pay | Admitting: "Endocrinology

## 2019-11-15 VITALS — BP 176/90 | HR 67 | Ht 67.5 in | Wt 362.6 lb

## 2019-11-15 DIAGNOSIS — I517 Cardiomegaly: Secondary | ICD-10-CM | POA: Diagnosis not present

## 2019-11-15 DIAGNOSIS — E039 Hypothyroidism, unspecified: Secondary | ICD-10-CM | POA: Diagnosis not present

## 2019-11-15 DIAGNOSIS — E876 Hypokalemia: Secondary | ICD-10-CM | POA: Diagnosis not present

## 2019-11-15 DIAGNOSIS — M1990 Unspecified osteoarthritis, unspecified site: Secondary | ICD-10-CM | POA: Diagnosis not present

## 2019-11-15 DIAGNOSIS — I10 Essential (primary) hypertension: Secondary | ICD-10-CM

## 2019-11-15 DIAGNOSIS — E118 Type 2 diabetes mellitus with unspecified complications: Secondary | ICD-10-CM | POA: Diagnosis not present

## 2019-11-15 DIAGNOSIS — Z79899 Other long term (current) drug therapy: Secondary | ICD-10-CM | POA: Diagnosis not present

## 2019-11-15 DIAGNOSIS — E1143 Type 2 diabetes mellitus with diabetic autonomic (poly)neuropathy: Secondary | ICD-10-CM | POA: Diagnosis not present

## 2019-11-15 DIAGNOSIS — E1165 Type 2 diabetes mellitus with hyperglycemia: Secondary | ICD-10-CM

## 2019-11-15 DIAGNOSIS — Z20822 Contact with and (suspected) exposure to covid-19: Secondary | ICD-10-CM | POA: Diagnosis not present

## 2019-11-15 DIAGNOSIS — Z794 Long term (current) use of insulin: Secondary | ICD-10-CM | POA: Diagnosis not present

## 2019-11-15 DIAGNOSIS — E559 Vitamin D deficiency, unspecified: Secondary | ICD-10-CM

## 2019-11-15 DIAGNOSIS — K3184 Gastroparesis: Secondary | ICD-10-CM | POA: Diagnosis not present

## 2019-11-15 DIAGNOSIS — K760 Fatty (change of) liver, not elsewhere classified: Secondary | ICD-10-CM | POA: Diagnosis not present

## 2019-11-15 DIAGNOSIS — E782 Mixed hyperlipidemia: Secondary | ICD-10-CM | POA: Diagnosis not present

## 2019-11-15 DIAGNOSIS — R1110 Vomiting, unspecified: Secondary | ICD-10-CM | POA: Diagnosis not present

## 2019-11-15 DIAGNOSIS — J45909 Unspecified asthma, uncomplicated: Secondary | ICD-10-CM | POA: Diagnosis not present

## 2019-11-15 DIAGNOSIS — IMO0002 Reserved for concepts with insufficient information to code with codable children: Secondary | ICD-10-CM

## 2019-11-15 DIAGNOSIS — R112 Nausea with vomiting, unspecified: Secondary | ICD-10-CM | POA: Diagnosis not present

## 2019-11-15 LAB — POCT GLYCOSYLATED HEMOGLOBIN (HGB A1C): Hemoglobin A1C: 10.7 % — AB (ref 4.0–5.6)

## 2019-11-15 LAB — BASIC METABOLIC PANEL
BUN: 18 (ref 4–21)
Creatinine: 0.9 (ref 0.5–1.1)

## 2019-11-15 MED ORDER — LEVOTHYROXINE SODIUM 137 MCG PO TABS: ORAL_TABLET | ORAL | 1 refills | Status: DC

## 2019-11-15 MED ORDER — HUMALOG KWIKPEN 200 UNIT/ML ~~LOC~~ SOPN: 20.0000 [IU] | PEN_INJECTOR | Freq: Three times a day (TID) | SUBCUTANEOUS | 2 refills | Status: DC

## 2019-11-15 MED ORDER — TRESIBA FLEXTOUCH 200 UNIT/ML ~~LOC~~ SOPN: 70.0000 [IU] | PEN_INJECTOR | Freq: Every day | SUBCUTANEOUS | 2 refills | Status: DC

## 2019-11-15 MED ORDER — VITAMIN D (ERGOCALCIFEROL) 1.25 MG (50000 UNIT) PO CAPS
50000.0000 [IU] | ORAL_CAPSULE | ORAL | 0 refills | Status: DC
Start: 2019-11-15 — End: 2020-09-09

## 2019-11-15 NOTE — Patient Instructions (Signed)

## 2019-11-15 NOTE — Progress Notes (Signed)
11/15/2019  Endocrinology follow-up note    Subjective:    Patient ID: Michelle Skinner, female    DOB: Mar 20, 1966,    Past Medical History:  Diagnosis Date  . Anemia   . Cervical cancer ([place])   . Depression   . Diabetes mellitus, type II ([place])   . GERD (gastroesophageal reflux disease)   . Hyperlipidemia   . Neuropathy   . PTSD (post-traumatic stress disorder)    Past Surgical History:  Procedure Laterality Date  . ABDOMINAL HYSTERECTOMY    . COLONOSCOPY WITH PROPOFOL N/A 05/25/2018   Procedure: COLONOSCOPY WITH PROPOFOL;  Surgeon: Daneil Dolin, MD;  Location: [facility];  Service: Endoscopy;  Laterality: N/A;  7:30am  . OTHER SURGICAL HISTORY Right    R Foot- I&D  . POLYPECTOMY  05/25/2018   Procedure: POLYPECTOMY;  Surgeon: Daneil Dolin, MD;  Location: [facility];  Service: Endoscopy;;  colon  . UMBILICAL HERNIA REPAIR  2007   Social History   Socioeconomic History  . Marital status: Married    Spouse name: Not on file  . Number of children: Not on file  . Years of education: Not on file  . Highest education level: Not on file  Occupational History  . Not on file  Tobacco Use  . Smoking status: Never Smoker  . Smokeless tobacco: Never Used  Substance and Sexual Activity  . Alcohol use: No    Alcohol/week: 0.0 standard drinks  . Drug use: No  . Sexual activity: Yes    Birth control/protection: Surgical  Other Topics Concern  . Not on file  Social History Narrative  . Not on file   Social Determinants of Health   Financial Resource Strain:   . Difficulty of Paying Living Expenses:   Food Insecurity:   . Worried About Charity fundraiser in the Last Year:   . Arboriculturist in the Last Year:   Transportation Needs:   . Film/video editor (Medical):   Marland Kitchen Lack of Transportation (Non-Medical):   Physical Activity:   . Days of Exercise per Week:   . Minutes of Exercise per Session:   Stress:   . Feeling of Stress :   Social  Connections:   . Frequency of Communication with Friends and Family:   . Frequency of Social Gatherings with Friends and Family:   . Attends Religious Services:   . Active Member of Clubs or Organizations:   . Attends Archivist Meetings:   Marland Kitchen Marital Status:    Outpatient Encounter Medications as of 11/15/2019  Medication Sig  . atorvastatin (LIPITOR) 20 MG tablet TAKE 1 TABLET BY MOUTH  DAILY AT 6 PM.  . Biotin (BIOTIN 5000) 5 MG CAPS Take 5 mg by mouth daily.  Marland Kitchen CALCIUM PO Take 2 each by mouth daily.   . carvedilol (COREG) 25 MG tablet Take 1 tablet (25 mg total) by mouth 2 (two) times daily with a meal.  . Cholecalciferol (VITAMIN D3) 125 MCG (5000 UT) CAPS Take 1 capsule (5,000 Units total) by mouth daily.  . Continuous Blood Gluc Receiver (FREESTYLE LIBRE 14 DAY READER) DEVI by Does not apply route 4 (four) times daily.  . insulin degludec (TRESIBA FLEXTOUCH) 200 UNIT/ML FlexTouch Pen Inject 70 Units into the skin at bedtime.  . insulin lispro (HUMALOG KWIKPEN) 200 UNIT/ML KwikPen Inject 20-26 Units into the skin 3 (three) times daily before meals.  Marland Kitchen levothyroxine (SYNTHROID) 137 MCG tablet TAKE ONE (1)  TABLET EACH DAY  . lisinopril (ZESTRIL) 20 MG tablet Take 1 tablet (20 mg total) by mouth daily.  . metoprolol succinate (TOPROL-XL) 100 MG 24 hr tablet Take 100 mg by mouth daily.  . Multiple Vitamin (MULTIVITAMIN) tablet Take 1 tablet by mouth daily.  Marland Kitchen omeprazole (PRILOSEC) 20 MG capsule Take 1 capsule (20 mg total) by mouth 2 (two) times daily before a meal. For 10 days while on Pylera for H.pylori  . pantoprazole (PROTONIX) 40 MG tablet TAKE ONE (1) TABLET EACH DAY  . pregabalin (LYRICA) 75 MG capsule Take 75 mg by mouth daily as needed (for pain).   . Vitamin D, Ergocalciferol, (DRISDOL) 1.25 MG (50000 UNIT) CAPS capsule Take 1 capsule (50,000 Units total) by mouth every 7 (seven) days.  . [DISCONTINUED] Insulin Degludec (TRESIBA FLEXTOUCH) 200 UNIT/ML SOPN Inject 90  Units into the skin at bedtime.  . [DISCONTINUED] Insulin Lispro (HUMALOG KWIKPEN) 200 UNIT/ML SOPN Inject 25-31 Units into the skin 3 (three) times daily before meals.  . [DISCONTINUED] levothyroxine (SYNTHROID) 125 MCG tablet TAKE ONE (1) TABLET EACH DAY   No facility-administered encounter medications on file as of 11/15/2019.   ALLERGIES: Allergies  Allergen Reactions  . Sulfa Antibiotics Rash  . Bactrim [Sulfamethoxazole-Trimethoprim] Itching  . Cherry Other (See Comments)    Unknown  . Gabapentin Other (See Comments)    Unknown  . Invokana [Canagliflozin] Other (See Comments)    Unknown   VACCINATION STATUS:  There is no immunization history on file for this patient.  Diabetes She presents for her follow-up diabetic visit. She has type 2 diabetes mellitus. Onset time: She was diagnosed at approximate age of 73 years. Her disease course has been worsening. There are no hypoglycemic associated symptoms. Pertinent negatives for hypoglycemia include no confusion, headaches, pallor or seizures. Associated symptoms include fatigue, polydipsia and polyuria. Pertinent negatives for diabetes include no chest pain and no polyphagia. There are no hypoglycemic complications. Symptoms are worsening. Diabetic complications include peripheral neuropathy and retinopathy. Pertinent negatives for diabetic complications include no autonomic neuropathy. Risk factors for coronary artery disease include diabetes mellitus, dyslipidemia, hypertension, obesity and sedentary lifestyle. She is compliant with treatment most of the time. Her weight is fluctuating dramatically. She is following a generally unhealthy diet. She has not had a previous visit with a dietitian (She missed her appointment.). She never participates in exercise. Her home blood glucose trend is decreasing steadily. Her breakfast blood glucose range is generally 180-200 mg/dl. Her lunch blood glucose range is generally 180-200 mg/dl. Her dinner  blood glucose range is generally 180-200 mg/dl. Her bedtime blood glucose range is generally 180-200 mg/dl. Her overall blood glucose range is 180-200 mg/dl. (She returns with unchanged A1c of 10.7%.  Analysis of her CGM device shows that she has time range only 50% of the time, 85% above range.  Her average blood glucose was 224 for the last 14 days.  More recently she did have tighter readings due to worsening of her gastroparesis symptoms.  Reportedly, she is not eating enough.  She has some rare and random hypoglycemia.    ) An ACE inhibitor/angiotensin II receptor blocker is not being taken. Eye exam is current.  Hyperlipidemia This is a chronic problem. The current episode started more than 1 year ago. The problem is uncontrolled. Recent lipid tests were reviewed and are high. Exacerbating diseases include diabetes, hypothyroidism and obesity. Pertinent negatives include no chest pain, myalgias or shortness of breath. She is currently on no antihyperlipidemic treatment.  Thyroid Problem Presents for initial visit. Symptoms include fatigue. Patient reports no cold intolerance, diarrhea, heat intolerance or palpitations. The following procedures have not been performed: radioiodine uptake scan and thyroidectomy. Her past medical history is significant for diabetes and hyperlipidemia.    Review of systems  Constitutional: + Minimally fluctuating body weight,  current  Body mass index is 55.95 kg/m. , no fatigue, no subjective hyperthermia, no subjective hypothermia Eyes: no blurry vision, no xerophthalmia ENT: no sore throat, no nodules palpated in throat, no dysphagia/odynophagia, no hoarseness Cardiovascular: no Chest Pain, no Shortness of Breath, no palpitations, no leg swelling Respiratory: no cough, no shortness of breath Gastrointestinal: + Intermittent nausea, rare vomiting.   Musculoskeletal: + Gait problem due to bilateral large lower extremities worsened by lymphedema, no muscle/joint  aches Skin: no rashes, no hyperemia Neurological: no tremors, no numbness, no tingling, no dizziness Psychiatric: no depression, no anxiety    Objective:    BP (!) 176/90   Pulse 67   Ht 5' 7.5" (1.715 m)   Wt (!) 362 lb 9.6 oz (164.5 kg)   BMI 55.95 kg/m   Wt Readings from Last 3 Encounters:  11/15/19 (!) 362 lb 9.6 oz (164.5 kg)  07/12/19 (!) 375 lb (170.1 kg)  08/26/18 (!) 363 lb (164.7 kg)     Physical Exam- Limited  Constitutional:  Body mass index is 55.95 kg/m. , not in acute distress, normal state of mind Eyes:  EOMI, no exophthalmos Neck: Supple Respiratory: Adequate breathing efforts Musculoskeletal: + Charcot's deformities on bilateral lower extremities, + significant lymphedema on bilateral lower extremities which are large, strength intact in all four extremities, no gross restriction of joint movements Skin:  no rashes, no hyperemia Neurological: no tremor with outstretched hands.  Psych: Reluctant affect.    Results for orders placed or performed in visit on 11/15/19  HgB A1c  Result Value Ref Range   Hemoglobin A1C 10.7 (A) 4.0 - 5.6 %   HbA1c POC (<> result, manual entry)     HbA1c, POC (prediabetic range)     HbA1c, POC (controlled diabetic range)     Diabetic Labs (most recent): Lab Results  Component Value Date   HGBA1C 10.7 (A) 11/15/2019   HGBA1C 10.7 (A) 07/12/2019   HGBA1C 9.1 (H) 04/01/2019    Lipid Panel     Component Value Date/Time   CHOL 175 11/18/2018 0833   TRIG 143 11/18/2018 0833   HDL 40 (L) 11/18/2018 0833   CHOLHDL 4.4 11/18/2018 0833   VLDL 42 (H) 04/25/2015 1001   LDLCALC 109 (H) 11/18/2018 0833    Assessment & Plan:   1. Uncontrolled type 2 diabetes mellitus with complication, with long-term current use of insulin ([place])   Her diabetes is complicated by bilateral peripheral neuropathy , lateral Charcot's feet, history of noncompliance/nonadherence, and possible retinopathy. - She has chronically uncontrolled  symptomatic type 2 DM of approximately since age 77 years duration.  She returns with unchanged A1c of 10.7%.  Analysis of her CGM device shows that she has time range only 15% of the time, 85% above range.  Her average blood glucose was 224 for the last 14 days.  More recently she did have tighter readings due to worsening of her diabetic gastropathy.  Reportedly, she is not eating enough.  She has some rare and random hypoglycemia.    -She remains at extremely high risk for more acute and chronic complications of diabetes which include CAD, CVA, CKD, retinopathy, and neuropathy. These are  all discussed in detail with the patient.   - I have counseled the patient on diet management and weight loss, by adopting a carbohydrate restricted/protein rich diet.  - Patient is advised to stick to a routine mealtimes to eat 3 meals  a day and avoid unnecessary snacks ( to snack only to correct hypoglycemia).   - she  admits there is a room for improvement in her diet and drink choices. -  Suggestion is made for her to avoid simple carbohydrates  from her diet including Cakes, Sweet Desserts / Pastries, Ice Cream, Soda (diet and regular), Sweet Tea, Candies, Chips, Cookies, Sweet Pastries,  Store Bought Juices, Alcohol in Excess of  1-2 drinks a day, Artificial Sweeteners, Coffee Creamer, and "Sugar-free" Products. This will help patient to have stable blood glucose profile and potentially avoid unintended weight gain.   - I encouraged the patient to switch to  unprocessed or minimally processed complex starch and increased protein intake (animal or plant source), fruits, and vegetables.   - I have approached patient with the following individualized plan to manage diabetes and patient agrees:   -She will continue to require intensive treatment with basal/bolus insulin at higher dose in order for her to achieve and maintain control of diabetes to target.    -She is advised to lower her Tresiba to 70  units nightly, lower her NovoLog to 20 units  (plus correction) 3 times daily AC for pe-meal blood glucose above 90 mg/dL, associated with strict monitoring of blood glucose 4 times a day-before meals and at bedtime. -She is currently using the libre CGM device, advised to wear it at all times.  - She is warned not to take insulin without monitoring blood glucose properly. -She is given individualized chart to correct hyperglycemia above 150 mg/dL. - She will be considered for insulin U500 if she continues to require greater than 200 units of insulin U100 during her next visit.    -Patient is encouraged to call clinic for blood glucose levels less than 70 or above 300 mg /dl. - she is adamant saying that she does not tolerate MTF, SGLT2 inhibitors,  And GLP-1 receptor agonist including [place].  Interpretation of CGM printouts discussed with her. - Patient specific target  A1c;  LDL, HDL, Triglycerides, and  Waist Circumference were discussed in detail.  2) BP/HTN: Her blood pressure is not controlled to target.   She is advised to take her blood pressure medications in the morning clonidine lisinopril 20 mg daily, carvedilol 25 mg p.o. twice daily, advised on salt restrictions.     3) Lipids/HPL: Most recent lipid panel showed LDL of 109, uncontrolled.  She is advised to continue Lipitor 20 mg nightly.     4) vitamin D deficiency:  -Her vitamin D remains low, will be retreated with high-dose vitamin D.  I discussed and prescribed vitamin D2 50,000 units weekly in addition to her regular maintenance dose of vitamin D3 5000 units daily.     5)  Weight/Diet: Her BMI is 35.9-Clearly this is complicating her care for diabetes.  She is a candidate for modest weight loss.  She cannot exercise optimally.  She keeps missing her appointment with  CDE exercise, and carbohydrates information provided.   6) hypothyroidism:  -Her recent thyroid function tests are consistent with inadequate replacement.   I discussed and increase her levothyroxine to 137 mcg p.o. daily before breakfast.    - We discussed about the correct intake of her thyroid hormone, on  empty stomach at fasting, with water, separated by at least 30 minutes from breakfast and other medications,  and separated by more than 4 hours from calcium, iron, multivitamins, acid reflux medications (PPIs). -Patient is made aware of the fact that thyroid hormone replacement is needed for life, dose to be adjusted by periodic monitoring of thyroid function tests.  6) Chronic Care/Health Maintenance:  -Patient is advised to continue lisinopril, Lipitor. She is encouraged to continue to follow up with Ophthalmology, Podiatrist at least yearly or according to recommendations, and advised to stay away from smoking. I have recommended yearly flu vaccine and pneumonia vaccination at least every 5 years; moderate intensity exercise for up to 150 minutes weekly; and  sleep for at least 7 hours a day.  -Given her significant peripheral neuropathy and bilateral Charcot's feet, she will benefit from continued supply of diabetic shoes.  I advised patient to maintain close follow up with their PCP for primary care needs.  - Time spent on this patient care encounter:  35 min, of which > 50% was spent in  counseling and the rest reviewing her blood glucose logs , discussing her hypoglycemia and hyperglycemia episodes, reviewing her current and  previous labs / studies  ( including abstraction from other facilities) and medications  doses and developing a  long term treatment plan and documenting her care.   Please refer to Patient Instructions for Blood Glucose Monitoring and Insulin/Medications Dosing Guide"  in media tab for additional information. Please  also refer to " Patient Self Inventory" in the Media  tab for reviewed elements of pertinent patient history.  Michelle Skinner participated in the discussions, expressed understanding, and voiced agreement  with the above plans.  All questions were answered to her satisfaction. she is encouraged to contact clinic should she have any questions or concerns prior to her return visit.   Follow up plan: Return in about 10 days (around 11/25/2019) for Follow up with Meter and Logs Only - no Labs.  Glade Lloyd, MD Phone: [phone]  Fax: [phone]  -  This note was partially dictated with voice recognition software. Similar sounding words can be transcribed inadequately or may not  be corrected upon review.  11/15/2019, 4:32 PM

## 2019-11-16 DIAGNOSIS — E876 Hypokalemia: Secondary | ICD-10-CM | POA: Diagnosis not present

## 2019-11-16 DIAGNOSIS — R112 Nausea with vomiting, unspecified: Secondary | ICD-10-CM | POA: Diagnosis not present

## 2019-11-16 DIAGNOSIS — E119 Type 2 diabetes mellitus without complications: Secondary | ICD-10-CM | POA: Diagnosis not present

## 2019-11-16 DIAGNOSIS — K3184 Gastroparesis: Secondary | ICD-10-CM | POA: Diagnosis not present

## 2019-11-17 DIAGNOSIS — E119 Type 2 diabetes mellitus without complications: Secondary | ICD-10-CM | POA: Diagnosis not present

## 2019-11-17 DIAGNOSIS — E876 Hypokalemia: Secondary | ICD-10-CM | POA: Diagnosis not present

## 2019-11-17 DIAGNOSIS — K3184 Gastroparesis: Secondary | ICD-10-CM | POA: Diagnosis not present

## 2019-11-17 DIAGNOSIS — R112 Nausea with vomiting, unspecified: Secondary | ICD-10-CM | POA: Diagnosis not present

## 2019-11-19 DIAGNOSIS — Z299 Encounter for prophylactic measures, unspecified: Secondary | ICD-10-CM | POA: Diagnosis not present

## 2019-11-19 DIAGNOSIS — E1165 Type 2 diabetes mellitus with hyperglycemia: Secondary | ICD-10-CM | POA: Diagnosis not present

## 2019-11-19 DIAGNOSIS — Z09 Encounter for follow-up examination after completed treatment for conditions other than malignant neoplasm: Secondary | ICD-10-CM | POA: Diagnosis not present

## 2019-11-19 DIAGNOSIS — K3184 Gastroparesis: Secondary | ICD-10-CM | POA: Diagnosis not present

## 2019-11-19 DIAGNOSIS — E1143 Type 2 diabetes mellitus with diabetic autonomic (poly)neuropathy: Secondary | ICD-10-CM | POA: Diagnosis not present

## 2019-11-26 DIAGNOSIS — Z794 Long term (current) use of insulin: Secondary | ICD-10-CM | POA: Diagnosis not present

## 2019-11-26 DIAGNOSIS — E119 Type 2 diabetes mellitus without complications: Secondary | ICD-10-CM | POA: Diagnosis not present

## 2019-11-29 ENCOUNTER — Other Ambulatory Visit: Payer: Self-pay

## 2019-11-29 ENCOUNTER — Encounter: Payer: Self-pay | Admitting: "Endocrinology

## 2019-11-29 ENCOUNTER — Ambulatory Visit (INDEPENDENT_AMBULATORY_CARE_PROVIDER_SITE_OTHER): Payer: Medicare Other | Admitting: "Endocrinology

## 2019-11-29 VITALS — BP 168/88 | HR 77 | Ht 67.5 in | Wt 382.8 lb

## 2019-11-29 DIAGNOSIS — E1165 Type 2 diabetes mellitus with hyperglycemia: Secondary | ICD-10-CM | POA: Diagnosis not present

## 2019-11-29 DIAGNOSIS — Z794 Long term (current) use of insulin: Secondary | ICD-10-CM

## 2019-11-29 DIAGNOSIS — IMO0002 Reserved for concepts with insufficient information to code with codable children: Secondary | ICD-10-CM

## 2019-11-29 DIAGNOSIS — E039 Hypothyroidism, unspecified: Secondary | ICD-10-CM

## 2019-11-29 DIAGNOSIS — E782 Mixed hyperlipidemia: Secondary | ICD-10-CM | POA: Diagnosis not present

## 2019-11-29 DIAGNOSIS — E118 Type 2 diabetes mellitus with unspecified complications: Secondary | ICD-10-CM

## 2019-11-29 DIAGNOSIS — I10 Essential (primary) hypertension: Secondary | ICD-10-CM

## 2019-11-29 MED ORDER — HUMULIN R U-500 KWIKPEN 500 UNIT/ML ~~LOC~~ SOPN: 50.0000 [IU] | PEN_INJECTOR | Freq: Three times a day (TID) | SUBCUTANEOUS | 2 refills | Status: DC

## 2019-11-29 MED ORDER — HUMULIN R U-500 KWIKPEN 500 UNIT/ML ~~LOC~~ SOPN
50.0000 [IU] | PEN_INJECTOR | Freq: Three times a day (TID) | SUBCUTANEOUS | 2 refills | Status: DC
Start: 2019-11-29 — End: 2019-11-29

## 2019-11-29 NOTE — Telephone Encounter (Signed)
[facility] in [place] called back stating the rx for the Humulin R U-500 needed the quantity to state 58ml due to insurance. Rx resent with quantity stating 38ml.

## 2019-11-29 NOTE — Patient Instructions (Signed)

## 2019-11-29 NOTE — Progress Notes (Signed)
11/29/2019  Endocrinology follow-up note    Subjective:    Patient ID: Michelle Skinner, female    DOB: 1965-07-17,    Past Medical History:  Diagnosis Date  . Anemia   . Cervical cancer ([place])   . Depression   . Diabetes mellitus, type II ([place])   . GERD (gastroesophageal reflux disease)   . Hyperlipidemia   . Neuropathy   . PTSD (post-traumatic stress disorder)    Past Surgical History:  Procedure Laterality Date  . ABDOMINAL HYSTERECTOMY    . COLONOSCOPY WITH PROPOFOL N/A 05/25/2018   Procedure: COLONOSCOPY WITH PROPOFOL;  Surgeon: Daneil Dolin, MD;  Location: [facility];  Service: Endoscopy;  Laterality: N/A;  7:30am  . OTHER SURGICAL HISTORY Right    R Foot- I&D  . POLYPECTOMY  05/25/2018   Procedure: POLYPECTOMY;  Surgeon: Daneil Dolin, MD;  Location: [facility];  Service: Endoscopy;;  colon  . UMBILICAL HERNIA REPAIR  2007   Social History   Socioeconomic History  . Marital status: Married    Spouse name: Not on file  . Number of children: Not on file  . Years of education: Not on file  . Highest education level: Not on file  Occupational History  . Not on file  Tobacco Use  . Smoking status: Never Smoker  . Smokeless tobacco: Never Used  Substance and Sexual Activity  . Alcohol use: No    Alcohol/week: 0.0 standard drinks  . Drug use: No  . Sexual activity: Yes    Birth control/protection: Surgical  Other Topics Concern  . Not on file  Social History Narrative  . Not on file   Social Determinants of Health   Financial Resource Strain:   . Difficulty of Paying Living Expenses:   Food Insecurity:   . Worried About Charity fundraiser in the Last Year:   . Arboriculturist in the Last Year:   Transportation Needs:   . Film/video editor (Medical):   Marland Kitchen Lack of Transportation (Non-Medical):   Physical Activity:   . Days of Exercise per Week:   . Minutes of Exercise per Session:   Stress:   . Feeling of Stress :   Social  Connections:   . Frequency of Communication with Friends and Family:   . Frequency of Social Gatherings with Friends and Family:   . Attends Religious Services:   . Active Member of Clubs or Organizations:   . Attends Archivist Meetings:   Marland Kitchen Marital Status:    Outpatient Encounter Medications as of 11/29/2019  Medication Sig  . atorvastatin (LIPITOR) 20 MG tablet TAKE 1 TABLET BY MOUTH  DAILY AT 6 PM.  . Biotin (BIOTIN 5000) 5 MG CAPS Take 5 mg by mouth daily.  Marland Kitchen CALCIUM PO Take 2 each by mouth daily.   . carvedilol (COREG) 25 MG tablet Take 1 tablet (25 mg total) by mouth 2 (two) times daily with a meal.  . Cholecalciferol (VITAMIN D3) 125 MCG (5000 UT) CAPS Take 1 capsule (5,000 Units total) by mouth daily.  . Continuous Blood Gluc Receiver (FREESTYLE LIBRE 14 DAY READER) DEVI by Does not apply route 4 (four) times daily.  Marland Kitchen levothyroxine (SYNTHROID) 137 MCG tablet TAKE ONE (1) TABLET EACH DAY  . lisinopril (ZESTRIL) 20 MG tablet Take 1 tablet (20 mg total) by mouth daily.  . metoprolol succinate (TOPROL-XL) 100 MG 24 hr tablet Take 100 mg by mouth daily.  . Multiple Vitamin (  MULTIVITAMIN) tablet Take 1 tablet by mouth daily.  Marland Kitchen omeprazole (PRILOSEC) 20 MG capsule Take 1 capsule (20 mg total) by mouth 2 (two) times daily before a meal. For 10 days while on Pylera for H.pylori  . pantoprazole (PROTONIX) 40 MG tablet TAKE ONE (1) TABLET EACH DAY  . pregabalin (LYRICA) 75 MG capsule Take 75 mg by mouth daily as needed (for pain).   . Vitamin D, Ergocalciferol, (DRISDOL) 1.25 MG (50000 UNIT) CAPS capsule Take 1 capsule (50,000 Units total) by mouth every 7 (seven) days.  . [DISCONTINUED] insulin degludec (TRESIBA FLEXTOUCH) 200 UNIT/ML FlexTouch Pen Inject 70 Units into the skin at bedtime.  . [DISCONTINUED] insulin lispro (HUMALOG KWIKPEN) 200 UNIT/ML KwikPen Inject 20-26 Units into the skin 3 (three) times daily before meals.  . [DISCONTINUED] insulin regular human CONCENTRATED  (HUMULIN R U-500 KWIKPEN) 500 UNIT/ML kwikpen Inject 50 Units into the skin 3 (three) times daily with meals.   No facility-administered encounter medications on file as of 11/29/2019.   ALLERGIES: Allergies  Allergen Reactions  . Sulfa Antibiotics Rash  . Bactrim [Sulfamethoxazole-Trimethoprim] Itching  . Cherry Other (See Comments)    Unknown  . Gabapentin Other (See Comments)    Unknown  . Invokana [Canagliflozin] Other (See Comments)    Unknown   VACCINATION STATUS:  There is no immunization history on file for this patient.  Diabetes She presents for her follow-up diabetic visit. She has type 2 diabetes mellitus. Onset time: She was diagnosed at approximate age of 61 years. Her disease course has been worsening. There are no hypoglycemic associated symptoms. Pertinent negatives for hypoglycemia include no confusion, headaches, pallor or seizures. Associated symptoms include fatigue, polydipsia and polyuria. Pertinent negatives for diabetes include no chest pain and no polyphagia. There are no hypoglycemic complications. Symptoms are worsening. Diabetic complications include peripheral neuropathy and retinopathy. Pertinent negatives for diabetic complications include no autonomic neuropathy. Risk factors for coronary artery disease include diabetes mellitus, dyslipidemia, hypertension, obesity and sedentary lifestyle. She is compliant with treatment most of the time. Her weight is fluctuating dramatically. She is following a generally unhealthy diet. She has not had a previous visit with a dietitian (She missed her appointment.). She never participates in exercise. Her home blood glucose trend is increasing steadily. Her breakfast blood glucose range is generally >200 mg/dl. Her lunch blood glucose range is generally >200 mg/dl. Her dinner blood glucose range is generally >200 mg/dl. Her bedtime blood glucose range is generally >200 mg/dl. Her overall blood glucose range is >200 mg/dl. (She  returns with unchanged A1c of 10.7%.  Analysis of her CGM device shows that she has time range only 50% of the time, 85% above range.  Her average blood glucose was 224 for the last 14 days.  More recently she did have tighter readings due to worsening of her gastroparesis symptoms.  Reportedly, she is not eating enough.  She has some rare and random hypoglycemia.    ) An ACE inhibitor/angiotensin II receptor blocker is not being taken. Eye exam is current.  Hyperlipidemia This is a chronic problem. The current episode started more than 1 year ago. The problem is uncontrolled. Recent lipid tests were reviewed and are high. Exacerbating diseases include diabetes, hypothyroidism and obesity. Pertinent negatives include no chest pain, myalgias or shortness of breath. She is currently on no antihyperlipidemic treatment.  Thyroid Problem Presents for initial visit. Symptoms include fatigue. Patient reports no cold intolerance, diarrhea, heat intolerance or palpitations. The following procedures have not been  performed: radioiodine uptake scan and thyroidectomy. Her past medical history is significant for diabetes and hyperlipidemia.     Review of systems  Constitutional: + Minimally fluctuating body weight,  current  Body mass index is 59.07 kg/m. , no fatigue, no subjective hyperthermia, no subjective hypothermia Eyes: no blurry vision, no xerophthalmia ENT: no sore throat, no nodules palpated in throat, no dysphagia/odynophagia, no hoarseness Cardiovascular: no Chest Pain, no Shortness of Breath, no palpitations, no leg swelling Respiratory: no cough, no shortness of breath Gastrointestinal: no Nausea/Vomiting/Diarhhea Musculoskeletal:  + Gait problem due to large bilateral lower extremities.   skin: no rashes, no hyperemia Neurological: no tremors, no numbness, no tingling, no dizziness Psychiatric: no depression, no anxiety   Objective:    BP (!) 168/88   Pulse 77   Ht 5' 7.5" (1.715 m)    Wt (!) 382 lb 12.8 oz (173.6 kg)   BMI 59.07 kg/m   Wt Readings from Last 3 Encounters:  11/29/19 (!) 382 lb 12.8 oz (173.6 kg)  11/15/19 (!) 362 lb 9.6 oz (164.5 kg)  07/12/19 (!) 375 lb (170.1 kg)     Physical Exam- Limited  Constitutional:  Body mass index is 59.07 kg/m. , not in acute distress, normal state of mind Eyes:  EOMI, no exophthalmos Neck: Supple Respiratory: Adequate breathing efforts Musculoskeletal: + Charcot's deformities on bilateral lower extremities, + significant lymphedema on bilateral lower extremities which are large, strength intact in all four extremities, no gross restriction of joint movements Skin:  no rashes, no hyperemia Neurological: no tremor with outstretched hands.  Psych: Reluctant affect.    Results for orders placed or performed in visit on 08/67/61  Basic metabolic panel  Result Value Ref Range   BUN 18 4 - 21   Creatinine 0.9 0.5 - 1.1   Diabetic Labs (most recent): Lab Results  Component Value Date   HGBA1C 10.7 (A) 11/15/2019   HGBA1C 10.7 (A) 07/12/2019   HGBA1C 9.1 (H) 04/01/2019    Lipid Panel     Component Value Date/Time   CHOL 175 11/18/2018 0833   TRIG 143 11/18/2018 0833   HDL 40 (L) 11/18/2018 0833   CHOLHDL 4.4 11/18/2018 0833   VLDL 42 (H) 04/25/2015 1001   LDLCALC 109 (H) 11/18/2018 0833    Assessment & Plan:   1. Uncontrolled type 2 diabetes mellitus with complication, with long-term current use of insulin ([place])   Her diabetes is complicated by bilateral peripheral neuropathy , lateral Charcot's feet, history of noncompliance/nonadherence, and possible retinopathy. - She has chronically uncontrolled symptomatic type 2 DM of approximately since age 62 years duration.  She returns with persistent hyperglycemia averaging 241.  Analysis of her CGM device shows time range 21%, above range 76%.  No major hypoglycemia.  -She remains at extremely high risk for more acute and chronic complications of diabetes  which include CAD, CVA, CKD, retinopathy, and neuropathy. These are all discussed in detail with the patient.   - I have counseled the patient on diet management and weight loss, by adopting a carbohydrate restricted/protein rich diet.  - Patient is advised to stick to a routine mealtimes to eat 3 meals  a day and avoid unnecessary snacks ( to snack only to correct hypoglycemia).   - she  admits there is a room for improvement in her diet and drink choices. -  Suggestion is made for her to avoid simple carbohydrates  from her diet including Cakes, Sweet Desserts / Pastries, Ice Cream, Soda (diet  and regular), Sweet Tea, Candies, Chips, Cookies, Sweet Pastries,  Store Bought Juices, Alcohol in Excess of  1-2 drinks a day, Artificial Sweeteners, Coffee Creamer, and "Sugar-free" Products. This will help patient to have stable blood glucose profile and potentially avoid unintended weight gain.  - I encouraged the patient to switch to  unprocessed or minimally processed complex starch and increased protein intake (animal or plant source), fruits, and vegetables.   - I have approached patient with the following individualized plan to manage diabetes and patient agrees:   -She will continue to require intensive treatment with basal/bolus insulin at higher dose in order for her to achieve and maintain control of diabetes to target.    -She would benefit from insulin U500 versus Tresiba/NovoLog.  She is at her last pens of his products.   -I discussed and prescribed insulin U500 to start 50 units 3 times daily AC for Premeal blood glucose readings above 90 mg per DL.    -She is currently using the libre CGM device, advised to wear it at all times.  - She is warned not to take insulin without monitoring blood glucose properly.    -Patient is encouraged to call clinic for blood glucose levels less than 70 or above 300 mg /dl. - she is adamant saying that she does not tolerate MTF, SGLT2 inhibitors,   And GLP-1 receptor agonist including [place].  Interpretation of CGM printouts discussed with her. - Patient specific target  A1c;  LDL, HDL, Triglycerides, and  Waist Circumference were discussed in detail.  2) BP/HTN: Her blood pressure is not controlled to target.   She is advised to take her blood pressure medications in the morning clonidine lisinopril 20 mg daily, carvedilol 25 mg p.o. twice daily, advised on salt restrictions.     3) Lipids/HPL: Most recent lipid panel showed LDL of 109, uncontrolled.  She is advised to continue Lipitor 20 mg p.o. nightly.  Side effects and precautions discussed with her.       4) vitamin D deficiency:  -Her vitamin D remains low, will be retreated with high-dose vitamin D.  I discussed and prescribed vitamin D2 50,000 units weekly in addition to her regular maintenance dose of vitamin D3 5000 units daily.     5)  Weight/Diet: Her BMI is 35.9-Clearly this is complicating her care for diabetes.  She is a candidate for modest weight loss.  She cannot exercise optimally.  She keeps missing her appointment with  CDE exercise, and carbohydrates information provided.   6) hypothyroidism:  -Her recent thyroid function tests are consistent with inadequate replacement.  I discussed and increase her levothyroxine to 137 mcg p.o. daily before breakfast.   - We discussed about the correct intake of her thyroid hormone, on empty stomach at fasting, with water, separated by at least 30 minutes from breakfast and other medications,  and separated by more than 4 hours from calcium, iron, multivitamins, acid reflux medications (PPIs). -Patient is made aware of the fact that thyroid hormone replacement is needed for life, dose to be adjusted by periodic monitoring of thyroid function tests.  6) Chronic Care/Health Maintenance:  -Patient is advised to continue lisinopril, Lipitor. She is encouraged to continue to follow up with Ophthalmology, Podiatrist at least yearly  or according to recommendations, and advised to stay away from smoking. I have recommended yearly flu vaccine and pneumonia vaccination at least every 5 years; moderate intensity exercise for up to 150 minutes weekly; and  sleep for at  least 7 hours a day.  -Given her significant peripheral neuropathy and bilateral Charcot's feet, she will benefit from continued supply of diabetic shoes.  I advised patient to maintain close follow up with their PCP for primary care needs. - Time spent on this patient care encounter:  35 min, of which > 50% was spent in  counseling and the rest reviewing her blood glucose logs , discussing her hypoglycemia and hyperglycemia episodes, reviewing her current and  previous labs / studies  ( including abstraction from other facilities) and medications  doses and developing a  long term treatment plan and documenting her care.   Please refer to Patient Instructions for Blood Glucose Monitoring and Insulin/Medications Dosing Guide"  in media tab for additional information. Please  also refer to " Patient Self Inventory" in the Media  tab for reviewed elements of pertinent patient history.  Michelle Skinner participated in the discussions, expressed understanding, and voiced agreement with the above plans.  All questions were answered to her satisfaction. she is encouraged to contact clinic should she have any questions or concerns prior to her return visit.    Follow up plan: Return in about 3 months (around 02/29/2020) for Bring Meter and Logs- A1c in Office.  Glade Lloyd, MD Phone: [phone]  Fax: [phone]  -  This note was partially dictated with voice recognition software. Similar sounding words can be transcribed inadequately or may not  be corrected upon review.  11/29/2019, 6:03 PM

## 2019-12-01 DIAGNOSIS — E113512 Type 2 diabetes mellitus with proliferative diabetic retinopathy with macular edema, left eye: Secondary | ICD-10-CM | POA: Diagnosis not present

## 2019-12-27 DIAGNOSIS — Z794 Long term (current) use of insulin: Secondary | ICD-10-CM | POA: Diagnosis not present

## 2019-12-27 DIAGNOSIS — E119 Type 2 diabetes mellitus without complications: Secondary | ICD-10-CM | POA: Diagnosis not present

## 2019-12-28 DIAGNOSIS — Z794 Long term (current) use of insulin: Secondary | ICD-10-CM | POA: Diagnosis not present

## 2019-12-28 DIAGNOSIS — E119 Type 2 diabetes mellitus without complications: Secondary | ICD-10-CM | POA: Diagnosis not present

## 2020-01-05 DIAGNOSIS — I10 Essential (primary) hypertension: Secondary | ICD-10-CM | POA: Diagnosis not present

## 2020-01-05 DIAGNOSIS — E119 Type 2 diabetes mellitus without complications: Secondary | ICD-10-CM | POA: Diagnosis not present

## 2020-01-05 DIAGNOSIS — E7800 Pure hypercholesterolemia, unspecified: Secondary | ICD-10-CM | POA: Diagnosis not present

## 2020-01-27 DIAGNOSIS — E119 Type 2 diabetes mellitus without complications: Secondary | ICD-10-CM | POA: Diagnosis not present

## 2020-01-27 DIAGNOSIS — Z794 Long term (current) use of insulin: Secondary | ICD-10-CM | POA: Diagnosis not present

## 2020-02-04 DIAGNOSIS — E7800 Pure hypercholesterolemia, unspecified: Secondary | ICD-10-CM | POA: Diagnosis not present

## 2020-02-04 DIAGNOSIS — E119 Type 2 diabetes mellitus without complications: Secondary | ICD-10-CM | POA: Diagnosis not present

## 2020-02-04 DIAGNOSIS — I10 Essential (primary) hypertension: Secondary | ICD-10-CM | POA: Diagnosis not present

## 2020-02-14 DIAGNOSIS — E113511 Type 2 diabetes mellitus with proliferative diabetic retinopathy with macular edema, right eye: Secondary | ICD-10-CM | POA: Diagnosis not present

## 2020-02-27 DIAGNOSIS — Z794 Long term (current) use of insulin: Secondary | ICD-10-CM | POA: Diagnosis not present

## 2020-02-27 DIAGNOSIS — E119 Type 2 diabetes mellitus without complications: Secondary | ICD-10-CM | POA: Diagnosis not present

## 2020-03-01 ENCOUNTER — Ambulatory Visit: Payer: Medicare Other | Admitting: "Endocrinology

## 2020-03-01 DIAGNOSIS — Z299 Encounter for prophylactic measures, unspecified: Secondary | ICD-10-CM | POA: Diagnosis not present

## 2020-03-01 DIAGNOSIS — E1122 Type 2 diabetes mellitus with diabetic chronic kidney disease: Secondary | ICD-10-CM | POA: Diagnosis not present

## 2020-03-01 DIAGNOSIS — L97209 Non-pressure chronic ulcer of unspecified calf with unspecified severity: Secondary | ICD-10-CM | POA: Diagnosis not present

## 2020-03-01 DIAGNOSIS — E1165 Type 2 diabetes mellitus with hyperglycemia: Secondary | ICD-10-CM | POA: Diagnosis not present

## 2020-03-01 DIAGNOSIS — I10 Essential (primary) hypertension: Secondary | ICD-10-CM | POA: Diagnosis not present

## 2020-03-01 DIAGNOSIS — E11319 Type 2 diabetes mellitus with unspecified diabetic retinopathy without macular edema: Secondary | ICD-10-CM | POA: Diagnosis not present

## 2020-03-06 ENCOUNTER — Other Ambulatory Visit: Payer: Self-pay

## 2020-03-06 ENCOUNTER — Ambulatory Visit (INDEPENDENT_AMBULATORY_CARE_PROVIDER_SITE_OTHER): Payer: Medicare Other | Admitting: "Endocrinology

## 2020-03-06 ENCOUNTER — Encounter: Payer: Self-pay | Admitting: "Endocrinology

## 2020-03-06 VITALS — BP 136/80 | HR 64 | Ht 67.5 in | Wt 379.0 lb

## 2020-03-06 DIAGNOSIS — E039 Hypothyroidism, unspecified: Secondary | ICD-10-CM | POA: Diagnosis not present

## 2020-03-06 DIAGNOSIS — E1165 Type 2 diabetes mellitus with hyperglycemia: Secondary | ICD-10-CM

## 2020-03-06 DIAGNOSIS — L03115 Cellulitis of right lower limb: Secondary | ICD-10-CM

## 2020-03-06 DIAGNOSIS — Z794 Long term (current) use of insulin: Secondary | ICD-10-CM | POA: Diagnosis not present

## 2020-03-06 DIAGNOSIS — I10 Essential (primary) hypertension: Secondary | ICD-10-CM

## 2020-03-06 DIAGNOSIS — E118 Type 2 diabetes mellitus with unspecified complications: Secondary | ICD-10-CM | POA: Diagnosis not present

## 2020-03-06 DIAGNOSIS — IMO0002 Reserved for concepts with insufficient information to code with codable children: Secondary | ICD-10-CM

## 2020-03-06 DIAGNOSIS — E782 Mixed hyperlipidemia: Secondary | ICD-10-CM | POA: Diagnosis not present

## 2020-03-06 LAB — POCT GLYCOSYLATED HEMOGLOBIN (HGB A1C): Hemoglobin A1C: 10 % — AB (ref 4.0–5.6)

## 2020-03-06 MED ORDER — LEVOTHYROXINE SODIUM 137 MCG PO TABS
ORAL_TABLET | ORAL | 1 refills | Status: DC
Start: 2020-03-06 — End: 2020-07-12

## 2020-03-06 MED ORDER — CEPHALEXIN 500 MG PO CAPS: 500.0000 mg | ORAL_CAPSULE | Freq: Four times a day (QID) | ORAL | 0 refills | Status: DC

## 2020-03-06 MED ORDER — FREESTYLE LIBRE 2 SENSOR MISC: 1.0000 | 3 refills | Status: DC

## 2020-03-06 MED ORDER — FREESTYLE LIBRE 2 READER DEVI: 0 refills | Status: DC

## 2020-03-06 MED ORDER — HUMULIN R U-500 KWIKPEN 500 UNIT/ML ~~LOC~~ SOPN: 40.0000 [IU] | PEN_INJECTOR | Freq: Three times a day (TID) | SUBCUTANEOUS | 2 refills | Status: DC

## 2020-03-06 NOTE — Patient Instructions (Signed)

## 2020-03-06 NOTE — Progress Notes (Signed)
03/06/2020  Endocrinology follow-up note  Subjective:    Patient ID: Michelle Skinner, female    DOB: 01-29-1966,    Past Medical History:  Diagnosis Date  . Anemia   . Cervical cancer ([place])   . Depression   . Diabetes mellitus, type II ([place])   . GERD (gastroesophageal reflux disease)   . Hyperlipidemia   . Neuropathy   . PTSD (post-traumatic stress disorder)    Past Surgical History:  Procedure Laterality Date  . ABDOMINAL HYSTERECTOMY    . COLONOSCOPY WITH PROPOFOL N/A 05/25/2018   Procedure: COLONOSCOPY WITH PROPOFOL;  Surgeon: Daneil Dolin, MD;  Location: [facility];  Service: Endoscopy;  Laterality: N/A;  7:30am  . OTHER SURGICAL HISTORY Right    R Foot- I&D  . POLYPECTOMY  05/25/2018   Procedure: POLYPECTOMY;  Surgeon: Daneil Dolin, MD;  Location: [facility];  Service: Endoscopy;;  colon  . UMBILICAL HERNIA REPAIR  2007   Social History   Socioeconomic History  . Marital status: Married    Spouse name: Not on file  . Number of children: Not on file  . Years of education: Not on file  . Highest education level: Not on file  Occupational History  . Not on file  Tobacco Use  . Smoking status: Never Smoker  . Smokeless tobacco: Never Used  Vaping Use  . Vaping Use: Never used  Substance and Sexual Activity  . Alcohol use: No    Alcohol/week: 0.0 standard drinks  . Drug use: No  . Sexual activity: Yes    Birth control/protection: Surgical  Other Topics Concern  . Not on file  Social History Narrative  . Not on file   Social Determinants of Health   Financial Resource Strain:   . Difficulty of Paying Living Expenses: Not on file  Food Insecurity:   . Worried About Charity fundraiser in the Last Year: Not on file  . Ran Out of Food in the Last Year: Not on file  Transportation Needs:   . Lack of Transportation (Medical): Not on file  . Lack of Transportation (Non-Medical): Not on file  Physical Activity:   . Days of Exercise per Week: Not  on file  . Minutes of Exercise per Session: Not on file  Stress:   . Feeling of Stress : Not on file  Social Connections:   . Frequency of Communication with Friends and Family: Not on file  . Frequency of Social Gatherings with Friends and Family: Not on file  . Attends Religious Services: Not on file  . Active Member of Clubs or Organizations: Not on file  . Attends Archivist Meetings: Not on file  . Marital Status: Not on file   Outpatient Encounter Medications as of 03/06/2020  Medication Sig  . atorvastatin (LIPITOR) 20 MG tablet TAKE 1 TABLET BY MOUTH  DAILY AT 6 PM.  . Biotin (BIOTIN 5000) 5 MG CAPS Take 5 mg by mouth daily.  Marland Kitchen CALCIUM PO Take 2 each by mouth daily.   . carvedilol (COREG) 25 MG tablet Take 1 tablet (25 mg total) by mouth 2 (two) times daily with a meal.  . cephALEXin (KEFLEX) 500 MG capsule Take 1 capsule (500 mg total) by mouth 4 (four) times daily.  . Cholecalciferol (VITAMIN D3) 125 MCG (5000 UT) CAPS Take 1 capsule (5,000 Units total) by mouth daily.  . Continuous Blood Gluc Receiver (FREESTYLE LIBRE 14 DAY READER) DEVI by Does not apply  route 4 (four) times daily.  . Continuous Blood Gluc Receiver (FREESTYLE LIBRE 2 READER) DEVI As directed  . Continuous Blood Gluc Sensor (FREESTYLE LIBRE 2 SENSOR) MISC 1 Piece by Does not apply route every 14 (fourteen) days.  . insulin regular human CONCENTRATED (HUMULIN R U-500 KWIKPEN) 500 UNIT/ML kwikpen Inject 40-50 Units into the skin 3 (three) times daily with meals.  Marland Kitchen levothyroxine (SYNTHROID) 137 MCG tablet TAKE ONE (1) TABLET EACH DAY  . lisinopril (ZESTRIL) 20 MG tablet Take 1 tablet (20 mg total) by mouth daily.  . metoprolol succinate (TOPROL-XL) 100 MG 24 hr tablet Take 100 mg by mouth daily.  . Multiple Vitamin (MULTIVITAMIN) tablet Take 1 tablet by mouth daily.  Marland Kitchen omeprazole (PRILOSEC) 20 MG capsule Take 1 capsule (20 mg total) by mouth 2 (two) times daily before a meal. For 10 days while on Pylera  for H.pylori  . pantoprazole (PROTONIX) 40 MG tablet TAKE ONE (1) TABLET EACH DAY  . pregabalin (LYRICA) 75 MG capsule Take 75 mg by mouth daily as needed (for pain).   . Vitamin D, Ergocalciferol, (DRISDOL) 1.25 MG (50000 UNIT) CAPS capsule Take 1 capsule (50,000 Units total) by mouth every 7 (seven) days.  . [DISCONTINUED] insulin regular human CONCENTRATED (HUMULIN R U-500 KWIKPEN) 500 UNIT/ML kwikpen Inject 50 Units into the skin 3 (three) times daily with meals.  . [DISCONTINUED] levothyroxine (SYNTHROID) 137 MCG tablet TAKE ONE (1) TABLET EACH DAY   No facility-administered encounter medications on file as of 03/06/2020.   ALLERGIES: Allergies  Allergen Reactions  . Sulfa Antibiotics Rash  . Bactrim [Sulfamethoxazole-Trimethoprim] Itching  . Cherry Other (See Comments)    Unknown  . Gabapentin Other (See Comments)    Unknown  . Invokana [Canagliflozin] Other (See Comments)    Unknown   VACCINATION STATUS:  There is no immunization history on file for this patient.  Diabetes She presents for her follow-up diabetic visit. She has type 2 diabetes mellitus. Onset time: She was diagnosed at approximate age of 85 years. Her disease course has been improving. There are no hypoglycemic associated symptoms. Pertinent negatives for hypoglycemia include no confusion, headaches, pallor or seizures. Associated symptoms include fatigue, polydipsia and polyuria. Pertinent negatives for diabetes include no chest pain and no polyphagia. There are no hypoglycemic complications. Symptoms are improving. Diabetic complications include peripheral neuropathy and retinopathy. Pertinent negatives for diabetic complications include no autonomic neuropathy. (She has bilateral Charcot's feet, bilateral lower extremity lymphedema.) Risk factors for coronary artery disease include diabetes mellitus, dyslipidemia, hypertension, obesity and sedentary lifestyle. She is compliant with treatment most of the time. Her  weight is fluctuating minimally. She is following a generally unhealthy diet. She has not had a previous visit with a dietitian (She missed her appointment.). She never participates in exercise. Her home blood glucose trend is decreasing steadily. Her breakfast blood glucose range is generally >200 mg/dl. Her lunch blood glucose range is generally >200 mg/dl. Her dinner blood glucose range is generally >200 mg/dl. Her bedtime blood glucose range is generally >200 mg/dl. Her overall blood glucose range is >200 mg/dl. (She returns with significantly above target glycemic profile, point-of-care A1c 10% slightly improving from 10.7% during her last visit.  Her CGM device shows 23% average, 71% above range, 6% hypoglycemia.     ) An ACE inhibitor/angiotensin II receptor blocker is not being taken. Eye exam is current.  Hyperlipidemia This is a chronic problem. The current episode started more than 1 year ago. The problem is uncontrolled.  Recent lipid tests were reviewed and are high. Exacerbating diseases include diabetes, hypothyroidism and obesity. Pertinent negatives include no chest pain, myalgias or shortness of breath. She is currently on no antihyperlipidemic treatment.  Thyroid Problem Presents for initial visit. Symptoms include fatigue. Patient reports no cold intolerance, diarrhea, heat intolerance or palpitations. The following procedures have not been performed: radioiodine uptake scan and thyroidectomy. Her past medical history is significant for diabetes and hyperlipidemia.     Review of systems  Constitutional: + Minimally fluctuating body weight,  current  Body mass index is 58.48 kg/m. , no fatigue, no subjective hyperthermia, no subjective hypothermia Eyes: no blurry vision, no xerophthalmia ENT: no sore throat, no nodules palpated in throat, no dysphagia/odynophagia, no hoarseness Cardiovascular: no Chest Pain, no Shortness of Breath, no palpitations, no leg swelling Respiratory: no  cough, no shortness of breath Gastrointestinal: no Nausea/Vomiting/Diarhhea Musculoskeletal:  + Gait problem due to large bilateral lower extremities.   skin: no rashes, no hyperemia Neurological: no tremors, no numbness, no tingling, no dizziness Psychiatric: no depression, no anxiety   Objective:    BP 136/80   Pulse 64   Ht 5' 7.5" (1.715 m)   Wt (!) 379 lb (171.9 kg)   BMI 58.48 kg/m   Wt Readings from Last 3 Encounters:  03/06/20 (!) 379 lb (171.9 kg)  11/29/19 (!) 382 lb 12.8 oz (173.6 kg)  11/15/19 (!) 362 lb 9.6 oz (164.5 kg)     Physical Exam- Limited  Constitutional:  Body mass index is 58.48 kg/m. , not in acute distress, normal state of mind Eyes:  EOMI, no exophthalmos Neck: Supple Respiratory: Adequate breathing efforts Musculoskeletal: + Charcot's deformities on bilateral lower extremities, + significant lymphedema on bilateral lower extremities which are large, strength intact in all four extremities, no gross restriction of joint movements, + superficial cellulitis on the bilateral lower extremities. Skin:  no rashes, no hyperemia Neurological: no tremor with outstretched hands.  Psych: Reluctant affect.    Results for orders placed or performed in visit on 03/06/20  HgB A1c  Result Value Ref Range   Hemoglobin A1C 10.0 (A) 4.0 - 5.6 %   HbA1c POC (<> result, manual entry)     HbA1c, POC (prediabetic range)     HbA1c, POC (controlled diabetic range)     Diabetic Labs (most recent): Lab Results  Component Value Date   HGBA1C 10.0 (A) 03/06/2020   HGBA1C 10.7 (A) 11/15/2019   HGBA1C 10.7 (A) 07/12/2019    Lipid Panel     Component Value Date/Time   CHOL 175 11/18/2018 0833   TRIG 143 11/18/2018 0833   HDL 40 (L) 11/18/2018 0833   CHOLHDL 4.4 11/18/2018 0833   VLDL 42 (H) 04/25/2015 1001   LDLCALC 109 (H) 11/18/2018 0833    Assessment & Plan:   1. Uncontrolled type 2 diabetes mellitus with complication, with long-term current use of  insulin ([place])   Her diabetes is complicated by bilateral peripheral neuropathy , lateral Charcot's feet, history of noncompliance/nonadherence, and possible retinopathy. - She has chronically uncontrolled symptomatic type 2 DM of approximately since age 46 years duration.  She returns with significantly above target glycemic profile, point-of-care A1c 10% slightly improving from 10.7% during her last visit.  Her CGM device shows 23% average, 71% above range, 6% hypoglycemia.    -She remains at extremely high risk for more acute and chronic complications of diabetes which include CAD, CVA, CKD, retinopathy, and neuropathy. These are all discussed in detail with  the patient.   - I have counseled the patient on diet management and weight loss, by adopting a carbohydrate restricted/protein rich diet.  - Patient is advised to stick to a routine mealtimes to eat 3 meals  a day and avoid unnecessary snacks ( to snack only to correct hypoglycemia).   - she  admits there is a room for improvement in her diet and drink choices. -  Suggestion is made for her to avoid simple carbohydrates  from her diet including Cakes, Sweet Desserts / Pastries, Ice Cream, Soda (diet and regular), Sweet Tea, Candies, Chips, Cookies, Sweet Pastries,  Store Bought Juices, Alcohol in Excess of  1-2 drinks a day, Artificial Sweeteners, Coffee Creamer, and "Sugar-free" Products. This will help patient to have stable blood glucose profile and potentially avoid unintended weight gain.  - I encouraged the patient to switch to  unprocessed or minimally processed complex starch and increased protein intake (animal or plant source), fruits, and vegetables.   - I have approached patient with the following individualized plan to manage diabetes and patient agrees:   -She will continue to require intensive treatment with multiple daily injections of insulin.  She has been better with insulin U500 versus Tresiba/NovoLog.   -Due to 6%  hypoglycemia overnight, she is advised to lower her U500 to 50 units with breakfast, 15 units with lunch, and 40 units with supper for Premeal blood glucose readings above 90 mg per DL.    -She is currently using the libre CGM device, advised to wear it at all times. - She is warned not to take insulin without monitoring blood glucose properly.   -Patient is encouraged to call clinic for blood glucose levels less than 70 or above 300 mg /dl. - she is adamant saying that she does not tolerate MTF, SGLT2 inhibitors,  And GLP-1 receptor agonist including [place].  Interpretation of CGM printouts discussed with her. - Patient specific target  A1c;  LDL, HDL, Triglycerides were discussed in detail.  2) BP/HTN: Her blood pressure is controlled to target.   She is advised to take her blood pressure medications in the morning clonidine lisinopril 20 mg daily, carvedilol 25 mg p.o. twice daily, advised on salt restrictions.     3) Lipids/HPL: Most recent lipid panel showed LDL of 109, uncontrolled.  She is advised to continue Lipitor 20 mg daily.   Side effects and precautions discussed with her.       4) vitamin D deficiency:  -She is advised to continue vitamin D2 50,000 units weekly in addition to her regular maintenance dose of vitamin D3 5000 units daily.     5)  Weight/Diet: Her BMI is 35.9-Clearly this is complicating her care for diabetes.  She is a candidate for modest weight loss.  She cannot exercise optimally.  She keeps missing her appointment with  CDE exercise, and carbohydrates information provided.   6) hypothyroidism:  -Her recent thyroid function tests are consistent with inadequate replacement.  She is advised to continue levothyroxine to 137 mcg p.o. daily before breakfast.   - We discussed about the correct intake of her thyroid hormone, on empty stomach at fasting, with water, separated by at least 30 minutes from breakfast and other medications,  and separated by more than 4  hours from calcium, iron, multivitamins, acid reflux medications (PPIs). -Patient is made aware of the fact that thyroid hormone replacement is needed for life, dose to be adjusted by periodic monitoring of thyroid function tests.  6) Chronic Care/Health Maintenance:  -Patient is advised to continue lisinopril, Lipitor. She is encouraged to continue to follow up with Ophthalmology, Podiatrist at least yearly or according to recommendations, and advised to stay away from smoking. I have recommended yearly flu vaccine and pneumonia vaccination at least every 5 years; moderate intensity exercise for up to 150 minutes weekly; and  sleep for at least 7 hours a day.  -Given her significant peripheral neuropathy and bilateral Charcot's feet, she will benefit from continued supply of diabetic shoes. -She will be referred to podiatrist.  The meantime, I prescribed Keflex 500 mg p.o. 4 times daily for 10 days.  She is allergic to sulfa  I advised patient to maintain close follow up with their PCP for primary care needs.   - Time spent on this patient care encounter:  45 min, of which > 50% was spent in  counseling and the rest reviewing her blood glucose logs , discussing her hypoglycemia and hyperglycemia episodes, reviewing her current and  previous labs / studies  ( including abstraction from other facilities) and medications  doses and developing a  long term treatment plan and documenting her care.   Please refer to Patient Instructions for Blood Glucose Monitoring and Insulin/Medications Dosing Guide"  in media tab for additional information. Please  also refer to " Patient Self Inventory" in the Media  tab for reviewed elements of pertinent patient history.  Michelle Skinner participated in the discussions, expressed understanding, and voiced agreement with the above plans.  All questions were answered to her satisfaction. she is encouraged to contact clinic should she have any questions or concerns  prior to her return visit.    Follow up plan: Return in about 3 months (around 06/06/2020) for F/U with Pre-visit Labs, Meter, Logs, A1c here., [place] with [facility].  Glade Lloyd, MD Phone: [phone]  Fax: [phone]  -  This note was partially dictated with voice recognition software. Similar sounding words can be transcribed inadequately or may not  be corrected upon review.  03/06/2020, 1:47 PM

## 2020-03-10 DIAGNOSIS — Z1231 Encounter for screening mammogram for malignant neoplasm of breast: Secondary | ICD-10-CM | POA: Diagnosis not present

## 2020-03-20 DIAGNOSIS — H43812 Vitreous degeneration, left eye: Secondary | ICD-10-CM | POA: Diagnosis not present

## 2020-03-20 DIAGNOSIS — H3582 Retinal ischemia: Secondary | ICD-10-CM | POA: Diagnosis not present

## 2020-03-20 DIAGNOSIS — E113513 Type 2 diabetes mellitus with proliferative diabetic retinopathy with macular edema, bilateral: Secondary | ICD-10-CM | POA: Diagnosis not present

## 2020-03-20 DIAGNOSIS — H3563 Retinal hemorrhage, bilateral: Secondary | ICD-10-CM | POA: Diagnosis not present

## 2020-03-31 ENCOUNTER — Ambulatory Visit (INDEPENDENT_AMBULATORY_CARE_PROVIDER_SITE_OTHER): Payer: Medicare Other | Admitting: Podiatry

## 2020-03-31 ENCOUNTER — Other Ambulatory Visit: Payer: Self-pay

## 2020-03-31 DIAGNOSIS — E118 Type 2 diabetes mellitus with unspecified complications: Secondary | ICD-10-CM | POA: Diagnosis not present

## 2020-03-31 DIAGNOSIS — K219 Gastro-esophageal reflux disease without esophagitis: Secondary | ICD-10-CM | POA: Insufficient documentation

## 2020-03-31 DIAGNOSIS — F4310 Post-traumatic stress disorder, unspecified: Secondary | ICD-10-CM | POA: Insufficient documentation

## 2020-03-31 DIAGNOSIS — F419 Anxiety disorder, unspecified: Secondary | ICD-10-CM | POA: Insufficient documentation

## 2020-03-31 DIAGNOSIS — I890 Lymphedema, not elsewhere classified: Secondary | ICD-10-CM

## 2020-03-31 DIAGNOSIS — I872 Venous insufficiency (chronic) (peripheral): Secondary | ICD-10-CM

## 2020-03-31 DIAGNOSIS — L089 Local infection of the skin and subcutaneous tissue, unspecified: Secondary | ICD-10-CM

## 2020-03-31 DIAGNOSIS — Z794 Long term (current) use of insulin: Secondary | ICD-10-CM

## 2020-03-31 DIAGNOSIS — IMO0002 Reserved for concepts with insufficient information to code with codable children: Secondary | ICD-10-CM

## 2020-03-31 DIAGNOSIS — F32A Depression, unspecified: Secondary | ICD-10-CM | POA: Insufficient documentation

## 2020-03-31 MED ORDER — DOXYCYCLINE HYCLATE 100 MG PO TABS: 100.0000 mg | ORAL_TABLET | Freq: Two times a day (BID) | ORAL | 0 refills | Status: AC

## 2020-04-04 ENCOUNTER — Encounter: Payer: Self-pay | Admitting: Podiatry

## 2020-04-04 ENCOUNTER — Other Ambulatory Visit: Payer: Self-pay

## 2020-04-04 DIAGNOSIS — I890 Lymphedema, not elsewhere classified: Secondary | ICD-10-CM

## 2020-04-04 NOTE — Progress Notes (Signed)
Subjective:  Patient ID: Michelle Skinner, female    DOB: [birth year],  MRN: [id number]  Chief Complaint  Patient presents with   Blister    DIABETIC foot exam- pt had blisters on feet- not healing- draining - further evlauton     54 y.o. female presents with the above complaint.  Patient presents with complaint of bilateral blister formation secondary to severe lymphedema to both lower extremity.  Patient states that the blisters have been coming and going to heal up but then it opened back up again.  Patient states that she has a lot of fluid in both lower extremity that she is not able to decrease.  She states that her primary care physician has not put her on any kind of water pill.  She denies any other acute complaints.  She was given antibiotics by her primary care doctor which helped a little bit.  She would like to discuss long-term management for lymphedema.  Review of Systems: Negative except as noted in the HPI. Denies N/V/F/Ch.  Past Medical History:  Diagnosis Date   Anemia    Cervical cancer ([facility])    Depression    Diabetes mellitus, type II ([facility])    GERD (gastroesophageal reflux disease)    Hyperlipidemia    Neuropathy    PTSD (post-traumatic stress disorder)     Current Outpatient Medications:    amoxicillin-clavulanate (AUGMENTIN) 500-125 MG tablet, Take 1 tablet by mouth 2 (two) times daily., Disp: , Rfl:    atorvastatin (LIPITOR) 20 MG tablet, TAKE 1 TABLET BY MOUTH  DAILY AT 6 PM., Disp: 90 tablet, Rfl: 1   Biotin (BIOTIN 5000) 5 MG CAPS, Take 5 mg by mouth daily., Disp: , Rfl:    CALCIUM PO, Take 2 each by mouth daily. , Disp: , Rfl:    carvedilol (COREG) 25 MG tablet, Take 1 tablet (25 mg total) by mouth 2 (two) times daily with a meal., Disp: 180 tablet, Rfl: 1   cephALEXin (KEFLEX) 500 MG capsule, Take 1 capsule (500 mg total) by mouth 4 (four) times daily., Disp: 40 capsule, Rfl: 0   Cholecalciferol (VITAMIN D3) 125 MCG (5000 UT) CAPS, Take 1  capsule (5,000 Units total) by mouth daily., Disp: 90 capsule, Rfl: 0   Continuous Blood Gluc Receiver (FREESTYLE LIBRE 14 DAY READER) DEVI, by Does not apply route 4 (four) times daily., Disp: , Rfl:    Continuous Blood Gluc Receiver (FREESTYLE LIBRE 2 READER) DEVI, As directed, Disp: 1 each, Rfl: 0   Continuous Blood Gluc Sensor (FREESTYLE LIBRE 2 SENSOR) MISC, 1 Piece by Does not apply route every 14 (fourteen) days., Disp: 2 each, Rfl: 3   doxycycline (VIBRA-TABS) 100 MG tablet, Take 1 tablet (100 mg total) by mouth 2 (two) times daily for 14 days., Disp: 28 tablet, Rfl: 0   FLUoxetine (PROZAC) 20 MG capsule, Take 20 mg by mouth daily., Disp: , Rfl:    insulin regular human CONCENTRATED (HUMULIN R U-500 KWIKPEN) 500 UNIT/ML kwikpen, Inject 40-50 Units into the skin 3 (three) times daily with meals., Disp: 12 mL, Rfl: 2   levothyroxine (SYNTHROID) 137 MCG tablet, TAKE ONE (1) TABLET EACH DAY, Disp: 90 tablet, Rfl: 1   lisinopril (ZESTRIL) 20 MG tablet, Take 1 tablet (20 mg total) by mouth daily., Disp: 90 tablet, Rfl: 1   metoprolol succinate (TOPROL-XL) 100 MG 24 hr tablet, Take 100 mg by mouth daily., Disp: , Rfl:    Multiple Vitamin (MULTIVITAMIN) tablet, Take 1 tablet by mouth  daily., Disp: , Rfl:    omeprazole (PRILOSEC) 20 MG capsule, Take 1 capsule (20 mg total) by mouth 2 (two) times daily before a meal. For 10 days while on Pylera for H.pylori, Disp: 20 capsule, Rfl: 0   pantoprazole (PROTONIX) 40 MG tablet, TAKE ONE (1) TABLET EACH DAY, Disp: 90 tablet, Rfl: 0   pregabalin (LYRICA) 75 MG capsule, Take 75 mg by mouth daily as needed (for pain). , Disp: , Rfl:    Vitamin D, Ergocalciferol, (DRISDOL) 1.25 MG (50000 UNIT) CAPS capsule, Take 1 capsule (50,000 Units total) by mouth every 7 (seven) days., Disp: 12 capsule, Rfl: 0  Social History   Tobacco Use  Smoking Status Never Smoker  Smokeless Tobacco Never Used    Allergies  Allergen Reactions   Sulfa Antibiotics  Rash   Bactrim [Sulfamethoxazole-Trimethoprim] Itching   Cherry Other (See Comments)    Unknown   Gabapentin Other (See Comments)    Unknown   Invokana [Canagliflozin] Other (See Comments)    Unknown   Objective:  There were no vitals filed for this visit. There is no height or weight on file to calculate BMI. Constitutional Well developed. Well nourished.  Vascular Dorsalis pedis pulses palpable bilaterally. Posterior tibial pulses palpable bilaterally. Capillary refill normal to all digits.  No cyanosis or clubbing noted. Pedal hair growth normal.  Neurologic Normal speech. Oriented to person, place, and time. Epicritic sensation to light touch grossly present bilaterally.  Dermatologic Nails well groomed and normal in appearance.  Orthopedic:  Bilateral lower extremities severe lymphedema noted.  Blister formation superficial with serous drainage.  Mild erythema/redness noted.  No purulent drainage noted.  Manual muscle testing 4 out of 5 bilaterally.   Radiographs: None Assessment:   1. Uncontrolled type 2 diabetes mellitus with complication, with long-term current use of insulin ([facility])   2. Lymphedema   3. Venous stasis dermatitis of both lower extremities   4. Infected blister of right foot, initial encounter    Plan:  Patient was evaluated and treated and all questions answered.  Bilateral lower extremity blister formation secondary to stasis dermatitis/lymphedema -I explained to the patient the etiology of blister formation various treatment options were discussed.  Given that patient has severe fluid in the lower extremity would likely due to being fluid overloaded I believe patient will benefit from primary care management to help decrease the fluid with medication as well as from lymphedema clinic at [facility] help decrease the fluid from lower extremity.  I believe patient will benefit from lymphedema pump versus Unna boot compression therapy. -Referral  for lymphedema clinic will be sent. -Compressive dressing was applied with multiple ABD pads to allow for decrease/absorption of the serous fluid.  No follow-ups on file.

## 2020-04-04 NOTE — Progress Notes (Signed)
Lymphedema

## 2020-04-05 DIAGNOSIS — E119 Type 2 diabetes mellitus without complications: Secondary | ICD-10-CM | POA: Diagnosis not present

## 2020-04-05 DIAGNOSIS — Z794 Long term (current) use of insulin: Secondary | ICD-10-CM | POA: Diagnosis not present

## 2020-04-06 DIAGNOSIS — E7800 Pure hypercholesterolemia, unspecified: Secondary | ICD-10-CM | POA: Diagnosis not present

## 2020-04-06 DIAGNOSIS — I10 Essential (primary) hypertension: Secondary | ICD-10-CM | POA: Diagnosis not present

## 2020-04-06 DIAGNOSIS — E119 Type 2 diabetes mellitus without complications: Secondary | ICD-10-CM | POA: Diagnosis not present

## 2020-04-26 DIAGNOSIS — E1143 Type 2 diabetes mellitus with diabetic autonomic (poly)neuropathy: Secondary | ICD-10-CM | POA: Diagnosis not present

## 2020-04-26 DIAGNOSIS — I10 Essential (primary) hypertension: Secondary | ICD-10-CM | POA: Diagnosis not present

## 2020-04-26 DIAGNOSIS — K3184 Gastroparesis: Secondary | ICD-10-CM | POA: Diagnosis not present

## 2020-04-26 DIAGNOSIS — Z299 Encounter for prophylactic measures, unspecified: Secondary | ICD-10-CM | POA: Diagnosis not present

## 2020-04-26 DIAGNOSIS — L97209 Non-pressure chronic ulcer of unspecified calf with unspecified severity: Secondary | ICD-10-CM | POA: Diagnosis not present

## 2020-04-27 ENCOUNTER — Inpatient Hospital Stay (HOSPITAL_COMMUNITY)
Admission: EM | Admit: 2020-04-27 | Discharge: 2020-05-04 | DRG: 603 | Disposition: A | Discharge status: Skilled Nursing Facility | Payer: Medicare Other | Attending: Internal Medicine | Admitting: Internal Medicine

## 2020-04-27 ENCOUNTER — Encounter (HOSPITAL_COMMUNITY): Payer: Self-pay | Admitting: Emergency Medicine

## 2020-04-27 ENCOUNTER — Other Ambulatory Visit: Payer: Self-pay

## 2020-04-27 DIAGNOSIS — Z6841 Body Mass Index (BMI) 40.0 and over, adult: Secondary | ICD-10-CM | POA: Diagnosis not present

## 2020-04-27 DIAGNOSIS — L03119 Cellulitis of unspecified part of limb: Secondary | ICD-10-CM | POA: Diagnosis not present

## 2020-04-27 DIAGNOSIS — E860 Dehydration: Secondary | ICD-10-CM | POA: Diagnosis present

## 2020-04-27 DIAGNOSIS — E559 Vitamin D deficiency, unspecified: Secondary | ICD-10-CM | POA: Diagnosis present

## 2020-04-27 DIAGNOSIS — M869 Osteomyelitis, unspecified: Secondary | ICD-10-CM

## 2020-04-27 DIAGNOSIS — Z8249 Family history of ischemic heart disease and other diseases of the circulatory system: Secondary | ICD-10-CM

## 2020-04-27 DIAGNOSIS — L03115 Cellulitis of right lower limb: Secondary | ICD-10-CM | POA: Diagnosis not present

## 2020-04-27 DIAGNOSIS — Z794 Long term (current) use of insulin: Secondary | ICD-10-CM | POA: Diagnosis not present

## 2020-04-27 DIAGNOSIS — Z83438 Family history of other disorder of lipoprotein metabolism and other lipidemia: Secondary | ICD-10-CM

## 2020-04-27 DIAGNOSIS — Z882 Allergy status to sulfonamides status: Secondary | ICD-10-CM

## 2020-04-27 DIAGNOSIS — E785 Hyperlipidemia, unspecified: Secondary | ICD-10-CM | POA: Diagnosis not present

## 2020-04-27 DIAGNOSIS — M7989 Other specified soft tissue disorders: Secondary | ICD-10-CM | POA: Diagnosis not present

## 2020-04-27 DIAGNOSIS — I872 Venous insufficiency (chronic) (peripheral): Secondary | ICD-10-CM | POA: Diagnosis present

## 2020-04-27 DIAGNOSIS — Z20822 Contact with and (suspected) exposure to covid-19: Secondary | ICD-10-CM | POA: Diagnosis present

## 2020-04-27 DIAGNOSIS — M25552 Pain in left hip: Secondary | ICD-10-CM | POA: Diagnosis not present

## 2020-04-27 DIAGNOSIS — N179 Acute kidney failure, unspecified: Secondary | ICD-10-CM | POA: Diagnosis not present

## 2020-04-27 DIAGNOSIS — F4310 Post-traumatic stress disorder, unspecified: Secondary | ICD-10-CM | POA: Diagnosis present

## 2020-04-27 DIAGNOSIS — Z833 Family history of diabetes mellitus: Secondary | ICD-10-CM | POA: Diagnosis not present

## 2020-04-27 DIAGNOSIS — I890 Lymphedema, not elsewhere classified: Secondary | ICD-10-CM | POA: Diagnosis not present

## 2020-04-27 DIAGNOSIS — Z888 Allergy status to other drugs, medicaments and biological substances status: Secondary | ICD-10-CM

## 2020-04-27 DIAGNOSIS — R6889 Other general symptoms and signs: Secondary | ICD-10-CM | POA: Diagnosis not present

## 2020-04-27 DIAGNOSIS — M6281 Muscle weakness (generalized): Secondary | ICD-10-CM | POA: Diagnosis not present

## 2020-04-27 DIAGNOSIS — Z743 Need for continuous supervision: Secondary | ICD-10-CM | POA: Diagnosis not present

## 2020-04-27 DIAGNOSIS — I10 Essential (primary) hypertension: Secondary | ICD-10-CM | POA: Diagnosis not present

## 2020-04-27 DIAGNOSIS — L0291 Cutaneous abscess, unspecified: Secondary | ICD-10-CM

## 2020-04-27 DIAGNOSIS — L0390 Cellulitis, unspecified: Secondary | ICD-10-CM | POA: Diagnosis present

## 2020-04-27 DIAGNOSIS — F32A Depression, unspecified: Secondary | ICD-10-CM | POA: Diagnosis present

## 2020-04-27 DIAGNOSIS — Z8541 Personal history of malignant neoplasm of cervix uteri: Secondary | ICD-10-CM

## 2020-04-27 DIAGNOSIS — Z7401 Bed confinement status: Secondary | ICD-10-CM | POA: Diagnosis not present

## 2020-04-27 DIAGNOSIS — E1143 Type 2 diabetes mellitus with diabetic autonomic (poly)neuropathy: Secondary | ICD-10-CM | POA: Diagnosis not present

## 2020-04-27 DIAGNOSIS — K219 Gastro-esophageal reflux disease without esophagitis: Secondary | ICD-10-CM | POA: Diagnosis not present

## 2020-04-27 DIAGNOSIS — K3184 Gastroparesis: Secondary | ICD-10-CM | POA: Diagnosis present

## 2020-04-27 DIAGNOSIS — E669 Obesity, unspecified: Secondary | ICD-10-CM | POA: Diagnosis present

## 2020-04-27 DIAGNOSIS — L03116 Cellulitis of left lower limb: Secondary | ICD-10-CM | POA: Diagnosis not present

## 2020-04-27 DIAGNOSIS — R262 Difficulty in walking, not elsewhere classified: Secondary | ICD-10-CM | POA: Diagnosis not present

## 2020-04-27 DIAGNOSIS — E1165 Type 2 diabetes mellitus with hyperglycemia: Secondary | ICD-10-CM | POA: Diagnosis present

## 2020-04-27 DIAGNOSIS — Z23 Encounter for immunization: Secondary | ICD-10-CM

## 2020-04-27 DIAGNOSIS — Z86010 Personal history of colonic polyps: Secondary | ICD-10-CM | POA: Diagnosis not present

## 2020-04-27 DIAGNOSIS — E039 Hypothyroidism, unspecified: Secondary | ICD-10-CM | POA: Diagnosis not present

## 2020-04-27 DIAGNOSIS — E119 Type 2 diabetes mellitus without complications: Secondary | ICD-10-CM | POA: Diagnosis not present

## 2020-04-27 DIAGNOSIS — E1159 Type 2 diabetes mellitus with other circulatory complications: Secondary | ICD-10-CM

## 2020-04-27 DIAGNOSIS — E118 Type 2 diabetes mellitus with unspecified complications: Secondary | ICD-10-CM | POA: Diagnosis not present

## 2020-04-27 DIAGNOSIS — Z79890 Hormone replacement therapy (postmenopausal): Secondary | ICD-10-CM

## 2020-04-27 DIAGNOSIS — Z91018 Allergy to other foods: Secondary | ICD-10-CM

## 2020-04-27 DIAGNOSIS — R2681 Unsteadiness on feet: Secondary | ICD-10-CM | POA: Diagnosis not present

## 2020-04-27 DIAGNOSIS — M2550 Pain in unspecified joint: Secondary | ICD-10-CM | POA: Diagnosis not present

## 2020-04-27 DIAGNOSIS — Z79899 Other long term (current) drug therapy: Secondary | ICD-10-CM

## 2020-04-27 LAB — CBC WITH DIFFERENTIAL/PLATELET
Abs Immature Granulocytes: 0.04 10*3/uL (ref 0.00–0.07)
Basophils Absolute: 0 10*3/uL (ref 0.0–0.1)
Basophils Relative: 0 %
Eosinophils Absolute: 0.1 10*3/uL (ref 0.0–0.5)
Eosinophils Relative: 1 %
HCT: 37.4 % (ref 36.0–46.0)
Hemoglobin: 11.5 g/dL — ABNORMAL LOW (ref 12.0–15.0)
Immature Granulocytes: 0 %
Lymphocytes Relative: 13 %
Lymphs Abs: 1.3 10*3/uL (ref 0.7–4.0)
MCH: 26.8 pg (ref 26.0–34.0)
MCHC: 30.7 g/dL (ref 30.0–36.0)
MCV: 87.2 fL (ref 80.0–100.0)
Monocytes Absolute: 0.6 10*3/uL (ref 0.1–1.0)
Monocytes Relative: 6 %
Neutro Abs: 7.8 10*3/uL — ABNORMAL HIGH (ref 1.7–7.7)
Neutrophils Relative %: 80 %
Platelets: 359 10*3/uL (ref 150–400)
RBC: 4.29 MIL/uL (ref 3.87–5.11)
RDW: 15.5 % (ref 11.5–15.5)
WBC: 9.8 10*3/uL (ref 4.0–10.5)
nRBC: 0 % (ref 0.0–0.2)

## 2020-04-27 LAB — RESPIRATORY PANEL BY RT PCR (FLU A&B, COVID)
Influenza A by PCR: NEGATIVE
Influenza B by PCR: NEGATIVE
SARS Coronavirus 2 by RT PCR: NEGATIVE

## 2020-04-27 LAB — COMPREHENSIVE METABOLIC PANEL
ALT: 13 U/L (ref 0–44)
AST: 14 U/L — ABNORMAL LOW (ref 15–41)
Albumin: 2.6 g/dL — ABNORMAL LOW (ref 3.5–5.0)
Alkaline Phosphatase: 100 U/L (ref 38–126)
Anion gap: 12 (ref 5–15)
BUN: 24 mg/dL — ABNORMAL HIGH (ref 6–20)
CO2: 24 mmol/L (ref 22–32)
Calcium: 9.4 mg/dL (ref 8.9–10.3)
Chloride: 99 mmol/L (ref 98–111)
Creatinine, Ser: 1.3 mg/dL — ABNORMAL HIGH (ref 0.44–1.00)
GFR, Estimated: 49 mL/min — ABNORMAL LOW (ref >60)
Glucose, Bld: 183 mg/dL — ABNORMAL HIGH (ref 70–99)
Potassium: 4.5 mmol/L (ref 3.5–5.1)
Sodium: 135 mmol/L (ref 135–145)
Total Bilirubin: 0.5 mg/dL (ref 0.3–1.2)
Total Protein: 7.4 g/dL (ref 6.5–8.1)

## 2020-04-27 LAB — LACTIC ACID, PLASMA: Lactic Acid, Venous: 1.4 mmol/L (ref 0.5–1.9)

## 2020-04-27 LAB — CBG MONITORING, ED: Glucose-Capillary: 255 mg/dL — ABNORMAL HIGH (ref 70–99)

## 2020-04-27 MED ORDER — INSULIN REGULAR HUMAN (CONC) 500 UNIT/ML ~~LOC~~ SOPN: 40.0000 [IU] | PEN_INJECTOR | Freq: Three times a day (TID) | SUBCUTANEOUS | Status: DC

## 2020-04-27 MED ORDER — PREGABALIN 75 MG PO CAPS: 75.0000 mg | ORAL_CAPSULE | Freq: Every day | ORAL | Status: DC | PRN

## 2020-04-27 MED ORDER — INSULIN REGULAR HUMAN (CONC) 500 UNIT/ML ~~LOC~~ SOPN
50.0000 [IU] | PEN_INJECTOR | Freq: Every day | SUBCUTANEOUS | Status: DC
  Administered 2020-04-28 – 2020-05-04 (×7): 50 [IU] via SUBCUTANEOUS
  Filled 2020-04-27: qty 3

## 2020-04-27 MED ORDER — ONDANSETRON HCL 4 MG PO TABS: 4.0000 mg | ORAL_TABLET | Freq: Four times a day (QID) | ORAL | Status: DC | PRN

## 2020-04-27 MED ORDER — CEFAZOLIN SODIUM-DEXTROSE 2-4 GM/100ML-% IV SOLN
2.0000 g | Freq: Once | INTRAVENOUS | Status: AC
  Administered 2020-04-27: 2 g via INTRAVENOUS
  Filled 2020-04-27: qty 100

## 2020-04-27 MED ORDER — ACETAMINOPHEN 650 MG RE SUPP: 650.0000 mg | Freq: Four times a day (QID) | RECTAL | Status: DC | PRN

## 2020-04-27 MED ORDER — INSULIN REGULAR HUMAN (CONC) 500 UNIT/ML ~~LOC~~ SOPN
15.0000 [IU] | PEN_INJECTOR | Freq: Every day | SUBCUTANEOUS | Status: DC
  Administered 2020-04-27 – 2020-05-04 (×8): 15 [IU] via SUBCUTANEOUS
  Filled 2020-04-27 (×2): qty 3

## 2020-04-27 MED ORDER — CARVEDILOL 25 MG PO TABS
25.0000 mg | ORAL_TABLET | Freq: Two times a day (BID) | ORAL | Status: DC
  Administered 2020-04-27 – 2020-05-04 (×15): 25 mg via ORAL
  Filled 2020-04-27 (×2): qty 1
  Filled 2020-04-27: qty 2
  Filled 2020-04-27: qty 8
  Filled 2020-04-27 (×11): qty 1

## 2020-04-27 MED ORDER — SODIUM CHLORIDE 0.9 % IV SOLN: Freq: Once | INTRAVENOUS | Status: AC

## 2020-04-27 MED ORDER — LEVOTHYROXINE SODIUM 25 MCG PO TABS
137.0000 ug | ORAL_TABLET | Freq: Every day | ORAL | Status: DC
  Administered 2020-04-27 – 2020-05-04 (×8): 137 ug via ORAL
  Filled 2020-04-27 (×8): qty 1

## 2020-04-27 MED ORDER — LACTATED RINGERS IV SOLN: INTRAVENOUS | Status: AC

## 2020-04-27 MED ORDER — ENOXAPARIN SODIUM 40 MG/0.4ML ~~LOC~~ SOLN: 40.0000 mg | SUBCUTANEOUS | Status: DC

## 2020-04-27 MED ORDER — VITAMIN D 25 MCG (1000 UNIT) PO TABS
5000.0000 [IU] | ORAL_TABLET | Freq: Every day | ORAL | Status: DC
  Administered 2020-04-27 – 2020-05-04 (×8): 5000 [IU] via ORAL
  Filled 2020-04-27 (×8): qty 5

## 2020-04-27 MED ORDER — CARVEDILOL 3.125 MG PO TABS: 25.0000 mg | ORAL_TABLET | Freq: Two times a day (BID) | ORAL | Status: DC

## 2020-04-27 MED ORDER — ACETAMINOPHEN 500 MG PO TABS
1000.0000 mg | ORAL_TABLET | Freq: Once | ORAL | Status: AC
  Administered 2020-04-27: 1000 mg via ORAL
  Filled 2020-04-27: qty 2

## 2020-04-27 MED ORDER — POLYETHYLENE GLYCOL 3350 17 G PO PACK: 17.0000 g | PACK | Freq: Every day | ORAL | Status: DC | PRN

## 2020-04-27 MED ORDER — VANCOMYCIN HCL 2000 MG/400ML IV SOLN
2000.0000 mg | Freq: Once | INTRAVENOUS | Status: AC
  Administered 2020-04-27: 2000 mg via INTRAVENOUS
  Filled 2020-04-27: qty 400

## 2020-04-27 MED ORDER — ATORVASTATIN CALCIUM 10 MG PO TABS
20.0000 mg | ORAL_TABLET | Freq: Every day | ORAL | Status: DC
  Administered 2020-04-27 – 2020-05-04 (×8): 20 mg via ORAL
  Filled 2020-04-27 (×9): qty 2

## 2020-04-27 MED ORDER — PANTOPRAZOLE SODIUM 40 MG PO TBEC
40.0000 mg | DELAYED_RELEASE_TABLET | Freq: Every day | ORAL | Status: DC
  Administered 2020-04-27 – 2020-05-04 (×8): 40 mg via ORAL
  Filled 2020-04-27 (×8): qty 1

## 2020-04-27 MED ORDER — ONDANSETRON HCL 4 MG/2ML IJ SOLN: 4.0000 mg | Freq: Four times a day (QID) | INTRAMUSCULAR | Status: DC | PRN

## 2020-04-27 MED ORDER — CEFAZOLIN SODIUM-DEXTROSE 1-4 GM/50ML-% IV SOLN: 1.0000 g | Freq: Once | INTRAVENOUS | Status: DC

## 2020-04-27 MED ORDER — VANCOMYCIN HCL 1750 MG/350ML IV SOLN
1750.0000 mg | INTRAVENOUS | Status: DC
  Administered 2020-04-28 – 2020-05-03 (×6): 1750 mg via INTRAVENOUS
  Filled 2020-04-27 (×6): qty 350

## 2020-04-27 MED ORDER — VITAMIN D (ERGOCALCIFEROL) 1.25 MG (50000 UNIT) PO CAPS: 50000.0000 [IU] | ORAL_CAPSULE | ORAL | Status: DC

## 2020-04-27 MED ORDER — INSULIN REGULAR HUMAN (CONC) 500 UNIT/ML ~~LOC~~ SOPN
40.0000 [IU] | PEN_INJECTOR | Freq: Every day | SUBCUTANEOUS | Status: DC
  Administered 2020-04-27: 40 [IU] via SUBCUTANEOUS
  Administered 2020-04-29: 15 [IU] via SUBCUTANEOUS
  Filled 2020-04-27: qty 3

## 2020-04-27 MED ORDER — ACETAMINOPHEN 325 MG PO TABS
650.0000 mg | ORAL_TABLET | Freq: Four times a day (QID) | ORAL | Status: DC | PRN
  Administered 2020-04-27 – 2020-05-03 (×9): 650 mg via ORAL
  Filled 2020-04-27 (×9): qty 2

## 2020-04-27 MED ORDER — FLUOXETINE HCL 20 MG PO CAPS
40.0000 mg | ORAL_CAPSULE | Freq: Every day | ORAL | Status: DC
  Administered 2020-04-27 – 2020-05-04 (×8): 40 mg via ORAL
  Filled 2020-04-27 (×10): qty 2

## 2020-04-27 NOTE — Hospital Course (Addendum)
Admitted 04/27/2020  Allergies: Sulfa antibiotics, Bactrim [sulfamethoxazole-trimethoprim], Cherry, Gabapentin, and Invokana [canagliflozin] Pertinent Hx: uncontrolled DM w neuropathy and charcotte feet, lymphedema and venous stasis dermatitis of lower extremities, HTN, HLD, GERD, depression, cervical cancer (s/p hysterectomy. No chemotherapy)  54 y.o. female p/w worsening of leg swelling, redness and drainage   * Cellulitis: Pt with Hx of uncontrolled DM, statis dermatitis and lymphedema came in w worsening of bilateral purulent cellulitis. Failed PO Abx out pt. No systemic sign of infection. No leukocytosis. Received  Vanc and cefazolin in ED and BC. BC and LA pending. On IV fluid and Vancomycin.   *DM2: Uncontrolled. She follows with endocrinologist outpatient.  Giving home regimen of regular 500U 50/15/40 u before meals for Bg>90.   *AKI: Creatinine 1.3 (0.95 months ago). Giving IV fluid. Holding Lisinopril  Consults: Wound and ostomy  Meds: Vancomycin, carvedilol, Lipitor, levothyroxine VTE ppx: Lovenox IVF: LR 125 mL/h x 10 H diet: HH/CM   * Michelle Skinner*  []  f/u LA []  f/u COVID test  O/N events:

## 2020-04-27 NOTE — H&P (Addendum)
Date: 04/27/2020               Patient Name:  Michelle Skinner MRN: [id number]  DOB: [birth year] Age / Sex: 54 y.o., female   PCP: Glenda Chroman, MD         Medical Service: Internal Medicine Teaching Service         Attending Physician: Dr. Lucious Groves, DO    First Contact: Dr. Johnney Ou Pager: [phone]  Second Contact: Dr. 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 No chemotherapy), presented with worsening of swelling and some drainage from his lower extremities. She has history of uncontrolled diabetes and lymphedema and has been dealing with lower extremity blisters, dermatitis and swelling and pain for few years. She mentions that she gets intermittent blisters since on her legs that come and go but this time, they did not heal and she has had more erythema, swelling and pain in the area for past couple of weeks that recently started to drain. She saw her PCP for that and has been on PO antibiotic. (multiple course of Abx with Augmentin> Keflex> made her sick> doxycycline) and also [facility] of lymphedema about a month ago. times ibuprofen for leg pain but try to avoid NSAID as much as possible due to GERD.  She denies any fever or chills although she feels cold sometimes. No other complaints. She mentions that oral antibiotic did not help that much and she was told to come to [facility]. She mentions that she still walk on her legs and does not use wheelchair despite having lymphedema and above symptoms but it has been hard particularly  because she lives in third floor. She takes Tylenol and some  ED course: Afebrile, vitals are stable, no leukocytosis.  Lactic acid pending. She received Vancomycin and Cefazolin and IMTS consulted for admission for IV antibiotic therapy for cellulitis.  Meds:  Current Meds  Medication Sig  . acetaminophen (TYLENOL) 500 MG tablet Take 1,000 mg by mouth every 6 (six) hours as needed for mild pain, fever or headache.  . Ascorbic Acid (VITAMIN C PO) Take 1 tablet by mouth daily.  Marland Kitchen atorvastatin (LIPITOR) 20 MG tablet TAKE 1 TABLET BY MOUTH  DAILY AT 6 PM. (Patient taking differently: Take 20 mg by mouth daily. TAKE 1 TABLET BY MOUTH  DAILY AT 6 PM.)  . Caffeine-Magnesium Salicylate (DIUREX PO) Take 200 mg by mouth daily.  . carvedilol (COREG) 25 MG tablet Take 1 tablet (25 mg total) by mouth 2 (two) times daily with a meal.  . Cholecalciferol (VITAMIN D3) 125 MCG (5000 UT) CAPS Take 1 capsule (5,000 Units total) by mouth daily.  Marland Kitchen doxycycline (ADOXA) 100 MG tablet Take 100 mg by mouth 2 (two) times daily. 10 day supply  . FLUoxetine (PROZAC) 40 MG capsule Take 40 mg by mouth daily.  . insulin regular human CONCENTRATED (HUMULIN R U-500 KWIKPEN) 500 UNIT/ML kwikpen Inject 40-50 Units into the skin 3 (three) times daily with meals.  Marland Kitchen levothyroxine (SYNTHROID) 137 MCG tablet TAKE ONE (1) TABLET EACH DAY (  Patient taking differently: Take 137 mcg by mouth daily before breakfast. TAKE ONE (1) TABLET EACH DAY)  . lisinopril (ZESTRIL) 20 MG tablet Take 1 tablet (20 mg total) by mouth daily.  . metoprolol succinate (TOPROL-XL) 100 MG 24 hr tablet Take 100 mg by mouth daily.  . Multiple Vitamin (MULTIVITAMIN WITH MINERALS) TABS tablet Take 1 tablet by mouth daily.  Marland Kitchen neomycin-bacitracin-polymyxin (NEOSPORIN) ointment Apply 1 application topically every 12 (twelve) hours. To legs  . pantoprazole (PROTONIX) 40 MG tablet TAKE ONE (1) TABLET EACH DAY (Patient taking differently: Take 40 mg by mouth daily. )  .  Thiamine HCl (VITAMIN B-1 PO) Take 1 tablet by mouth daily.  Marland Kitchen VITAMIN A PO Take 1 tablet by mouth daily.  Marland Kitchen VITAMIN E PO Take 1 tablet by mouth daily.    Allergies: Allergies as of 04/27/2020 - Review Complete 04/27/2020  Allergen Reaction Noted  . Sulfa antibiotics Rash 12/08/2013  . Bactrim [sulfamethoxazole-trimethoprim] Itching 04/25/2015  . Cherry Other (See Comments) 10/20/2017  . Gabapentin Other (See Comments) 04/25/2015  . Invokana [canagliflozin] Other (See Comments) 04/25/2015  . Other  04/27/2020   Past Medical History:  Diagnosis Date  . Anemia   . Cervical cancer ([place])   . Depression   . Diabetes mellitus, type II ([place])   . GERD (gastroesophageal reflux disease)   . Hyperlipidemia   . Neuropathy   . PTSD (post-traumatic stress disorder)     Family History:  Family History  Problem Relation Age of Onset  . Hypertension Mother   . Diabetes Father   . Hyperlipidemia Father   . CAD Father   . Stroke Father   . Osteoporosis Maternal Grandmother   . Cancer Maternal Grandmother   . Hypertension Maternal Grandmother   . Colon cancer Neg Hx      Social History: Does not smoke cigarettes, no alcohol, no illicit drug use  Review of Systems: A complete ROS was negative except as per HPI.  Constitutional: Negative for chills and fever.  Respiratory: Negative for shortness of breath.   Cardiovascular: Negative for chest pain MSK positive for leg swelling, redness and draignage and pain Gastrointestinal: Negative for abdominal pain, nausea and vomiting.  Neurological: Negative for dizziness and headaches.   Physical Exam: Blood pressure (!) 190/88, pulse 81, temperature 98.1 F (36.7 C), temperature source Oral, resp. rate 16, height 5' 7.5" (1.715 m), weight (!) 170.1 kg, SpO2 100 %.  Physical Exam Constitutional:      General: She is not in acute distress.    Appearance: Normal appearance. She is not ill-appearing or toxic-appearing.  HENT:     Head:  Normocephalic and atraumatic.  Eyes:     Extraocular Movements: Extraocular movements intact.  Cardiovascular:     Rate and Rhythm: Normal rate and regular rhythm.     Pulses: Normal pulses.     Heart sounds: Normal heart sounds. No murmur heard.   Pulmonary:     Effort: Pulmonary effort is normal. No respiratory distress.     Breath sounds: Normal breath sounds. No wheezing, rhonchi or rales.  Abdominal:     Palpations: Abdomen is soft.     Tenderness: There is no abdominal tenderness.  Musculoskeletal:        General: Swelling and deformity present.     Right lower leg: Edema present.     Left lower leg: Edema present.  Skin:    Findings: Erythema present.  Neurological:     General: No focal deficit present.  Mental Status: She is alert and oriented to person, place, and time.  Psychiatric:        Mood and Affect: Mood normal.        Behavior: Behavior normal.        Thought Content: Thought content normal.        Judgment: Judgment normal.            EKG: Not obtained CXR: Not obtained  Assessment & Plan by Problem: Active Problems:   Cellulitis  Patient presented with worsening of swelling and some drainage from his lower extremities.  She has history of uncontrolled diabetes and lymphedema and has been dealing with lower extremity cellulitis in setting of stasis dermatitis and lymphedema. Failed PO Abx therapy and admitted for IV antibiotic.  Bilateral lower extremity stasis dermatitis +/- cellulitis: In setting of uncontrolled diabetes, underlying lymphedema and venous stasis dermatitis. Failed courses of p.o. doxycycline outpatient. No systemic sign of infection (afebrile, no leukocytosis or leukopenia, vitals are stable.  Lactic acid pending) Received vancomycin and cefazolin in ED.   -We will continue vancomycin for now, her bilateral skin changes w/o systemic evidence of infection can be stasis dermatis and not necessarily cellulitis though. (has  some oozing but no clear purulence now) -Follow-up blood culture -CBC daily -Consult wound and ostomy -LR 125 ml/h x 10 h -Patient is allergic to sulfa antibiotic -She has some oozing from her legs, but I do not think she needs any imaging for osteo at this point.  May consider imaging if no improvement. She also has Charcot feet.  Lymphedema Venous stasis dermatitis  -[facility].  Anterior chart, day believe that she will benefit from lymphedema pump versus Unna boot compression therapy and she was referred to lymphedema clinic. -We will consult wound and ostomy here for providing care when in hospital. Will likely need unnaboots  Uncontrolled type 2 diabetes with complication: Peripheral neuropathy, bilateral Charcot's feet, gastroparesis She follows with endocrinologist, Dr. Dorris Fetch. Last hemoglobin A1c was 10 on 03/06/2020 with slight improvement from prior A1c.   Per Dr. Williemae Natter note, she has been better with insulin U500 versus Tresiba/NovoLog. On 500 U, 50 units with breakfast, 15 units with lunch, 40 units with supper for blood glucose above 90 at home -Patient believes that she does not tolerate SGLT2 inhibitors and GLP-1 agonist. She has CGM.   -Apparently her endocrinologist already tried long acting Insulin but was switched to short acting -Hemoglobin A1c -Will continue home regimen. If difficult to manage BG, may switch to SSI. -Continue lyrica  Other chronic medical condition:  HTN  She is now hypertensive at 190/88. Giving home coreg (also on metoprolol?? At home!) Patient reports her BP is OK at home.  -Continue home carvedilol -Hold lisinopril in setting of mild AKI  -Needs to follow up with PCP for home meds adjustment  Hypothyroidism -Continue home meds: Levothyroxine 137 MCG daily  Vitamin D deficiency: -Continue home vitamin D2 50,000 units weekly and vitamin D3 5000 units daily  Dispo: Admit patient to  Inpatient with expected length of stay greater than 2 midnights.  SignedDewayne Hatch, MD 04/27/2020, 1:38 PM  Pager: [phone] After 5pm on weekdays and 1pm on weekends: On Call pager: [phone]

## 2020-04-27 NOTE — ED Triage Notes (Signed)
Pt arrives to ED with complaints of increased bilateral leg swelling. Pt reports blisters on both legs that continue to weep and PCP is concerned for cellulitis. Pt hx of stasis dermatitis and lymphedema.

## 2020-04-27 NOTE — Consult Note (Signed)
Lymphedema  Resources (updated July 2019 ) Each site requires a referral from your primary care MD [place], [place]  [phone] (Upper extremities)  [place], [place] [phone] (Lower extremities)  [facility] [address] [place], [place] [address] [phone] [facility] [place], [place] [address] [phone], [address] Medical Office Building [place], [place] [phone]  [facility] [place] [address] [place], [place] [address] [phone]  [facility]  (only treatment for lymphedema related to cancer diagnosis) [place], [place] [address] [phone]    [facility] [address] [place], [place] [address] [phone]  [facility] [address] [place], [place] [address] [phone] [facility] [place] [address] [place],  [address] [phone]

## 2020-04-27 NOTE — Progress Notes (Signed)
Pharmacy Antibiotic Note  Michelle Skinner is a 54 y.o. female admitted on 04/27/2020 with cellulitis.  Pharmacy has been consulted for vancomycin dosing. Pt is afebrile and WBC is WNL. Scr is slightly above baseline at 1.3.   Plan: Vancomycin 2gm IV x 1 then 1750mg  IV Q24H F/u renal fxn, C&S, clinical status and trough at SS  Height: 5' 7.5" (171.5 cm) Weight: (!) 170.1 kg (375 lb) IBW/kg (Calculated) : 62.75  Temp (24hrs), Avg:98.1 F (36.7 C), Min:98.1 F (36.7 C), Max:98.1 F (36.7 C)  Recent Labs  Lab 04/27/20 0741  WBC 9.8  CREATININE 1.30*    Estimated Creatinine Clearance: 84.5 mL/min (A) (by C-G formula based on SCr of 1.3 mg/dL (H)).    Allergies  Allergen Reactions  . Sulfa Antibiotics Rash  . Bactrim [Sulfamethoxazole-Trimethoprim] Itching  . Cherry Other (See Comments)    Unknown  . Gabapentin Other (See Comments)    Unknown  . Invokana [Canagliflozin] Other (See Comments)    Unknown    Antimicrobials this admission: Vanc 10/21>> Cefazolin x 1 10/21  Dose adjustments this admission: N/A  Microbiology results: Pending  Thank you for allowing pharmacy to be a part of this patient's care.  Jaeson Molstad, Rande Lawman 04/27/2020 8:16 AM

## 2020-04-27 NOTE — Progress Notes (Signed)
Inpatient Diabetes Program Recommendations  AACE/ADA: New Consensus Statement on Inpatient Glycemic Control (2015)  Target Ranges:  Prepandial:   less than 140 mg/dL      Peak postprandial:   less than 180 mg/dL (1-2 hours)      Critically ill patients:  140 - 180 mg/dL   Lab Results  Component Value Date   GLUCAP 147 (H) 05/25/2018   HGBA1C 10.0 (A) 03/06/2020    Review of Glycemic Control Cellulitis Diabetes history: DM 2 since age 54, Sees Dr. Dorris Fetch (Endocrinologist) Outpatient Diabetes medications: Humulin R U-500 50 units breakfast, 15 units lunch, 40 units supper Current orders for Inpatient glycemic control: Humulin R U-500 50-15-40  Consult for uncontrolled DM A1c 10% on 8/30 at Endocrinology visit  Glucose on presentation 183.  Will wait till A1c results as pt was counseled extensively at that visit. Pt will have follow up with Dr. Dorris Fetch in November as requested by Endo.  Thanks,  Tama Headings RN, MSN, BC-ADM Inpatient Diabetes Coordinator Team Pager [phone] (8a-5p)

## 2020-04-27 NOTE — Consult Note (Signed)
[facility] Nurse Consult Note:  Patient receiving care in [facility] ED001 Remote consult.  Reason for Consult: Lymphedema, dermatitis  Wound type: Chronic lymphedema to bilateral LE with noted open wounds and weeping from the RLE. An open wound on the lateral aspect of the LLE Measurements should be completed and added to flow chart by bedside RN.  Patient should be managed by a Lymphedema Clinic (See resources added by Para March, RN) Dressing procedure/placement/frequency: Apply Xeroform gauze Kellie Simmering # 294) to all open wound areas on bilateral lower extremities . Cover with 4 x 4s or ABD pads and wrap with Kerlix. Change daily or PRN weeping/soiling.   Thank you for the consult. [facility] nurse will not follow at this time.   Please re-consult the [facility] team if needed.  Cathlean Marseilles Tamala Julian, MSN, RN, [place], Lysle Pearl, [facility] Wound Treatment Associate Pager [phone]

## 2020-04-27 NOTE — ED Notes (Signed)
Pt checked sugar 309- will give her home insulin 40 units.

## 2020-04-27 NOTE — ED Provider Notes (Signed)
[facility] EMERGENCY DEPARTMENT Provider Note   CSN: [id number] Arrival date & time: 04/27/20  5573     History Chief Complaint  Patient presents with  . Leg Swelling    Michelle Skinner is a 54 y.o. female.  Pt presents to the ED today with bilateral leg swelling.  Pt has a hx of lymphedema.  She has noticed a blister form a few months ago.  Since then, she has been getting worse and worse redness and pain to her legs.  The pt has been on several rounds of abx.  Most recently, she was on doxy and her pcp just prescribed another round of doxy.  She is worried because the legs are getting worse.  She changed the dressings pta and they are already soaked through.        Past Medical History:  Diagnosis Date  . Anemia   . Cervical cancer (Michelle Skinner)   . Depression   . Diabetes mellitus, type II (Michelle Skinner)   . GERD (gastroesophageal reflux disease)   . Hyperlipidemia   . Neuropathy   . PTSD (post-traumatic stress disorder)     Patient Active Problem List   Diagnosis Date Noted  . Anxiety 03/31/2020  . Depression 03/31/2020  . GERD (gastroesophageal reflux disease) 03/31/2020  . Posttraumatic stress disorder 03/31/2020  . Personal history of noncompliance with medical treatment, presenting hazards to health 02/09/2018  . Abnormal CT of the abdomen 10/20/2017  . Dilated cbd, acquired 10/20/2017  . Essential hypertension, benign 01/14/2017  . Primary hypothyroidism 05/03/2015  . Mixed hyperlipidemia 05/03/2015  . Uncontrolled type 2 diabetes mellitus with complication, with long-term current use of insulin (Michelle Skinner) 04/25/2015  . Morbid obesity due to excess calories (Michelle Skinner) 04/25/2015  . Vitamin D deficiency 04/25/2015  . Cervical cancer (Michelle Skinner) 10/05/2012    Past Surgical History:  Procedure Laterality Date  . ABDOMINAL HYSTERECTOMY    . COLONOSCOPY WITH PROPOFOL N/A 05/25/2018   Procedure: COLONOSCOPY WITH PROPOFOL;  Surgeon: Michelle Dolin, Skinner;  Location: [facility];  Service: Endoscopy;  Laterality: N/A;  7:30am  . OTHER SURGICAL HISTORY Right    R Foot- I&D  . POLYPECTOMY  05/25/2018   Procedure: POLYPECTOMY;  Surgeon: Michelle Dolin, Skinner;  Location: [facility];  Service: Endoscopy;;  colon  . UMBILICAL HERNIA REPAIR  2007     OB History   No obstetric history on file.     Family History  Problem Relation Age of Onset  . Hypertension Mother   . Diabetes Father   . Hyperlipidemia Father   . CAD Father   . Stroke Father   . Osteoporosis Maternal Grandmother   . Cancer Maternal Grandmother   . Hypertension Maternal Grandmother   . Colon cancer Neg Hx     Social History   Tobacco Use  . Smoking status: Never Smoker  . Smokeless tobacco: Never Used  Vaping Use  . Vaping Use: Never used  Substance Use Topics  . Alcohol use: No    Alcohol/week: 0.0 standard drinks  . Drug use: No    Home Medications Prior to Admission medications   Medication Sig Start Date End Date Taking? Authorizing Provider  acetaminophen (TYLENOL) 500 MG tablet Take 1,000 mg by mouth every 6 (six) hours as needed for mild pain, fever or headache.   Yes Provider, Historical, Skinner  Ascorbic Acid (VITAMIN C PO) Take 1 tablet by mouth daily.   Yes Provider, Historical, Skinner  atorvastatin (LIPITOR)  20 MG tablet TAKE 1 TABLET BY MOUTH  DAILY AT 6 PM. Patient taking differently: Take 20 mg by mouth daily. TAKE 1 TABLET BY MOUTH  DAILY AT 6 PM. 07/12/19  Yes Michelle Skinner  Caffeine-Magnesium Salicylate (DIUREX PO) Take 200 mg by mouth daily.   Yes Provider, Historical, Skinner  carvedilol (COREG) 25 MG tablet Take 1 tablet (25 mg total) by mouth 2 (two) times daily with a meal. 07/12/19  Yes Michelle Skinner  Cholecalciferol (VITAMIN D3) 125 MCG (5000 UT) CAPS Take 1 capsule (5,000 Units total) by mouth daily. 05/13/18  Yes Michelle Skinner  doxycycline (ADOXA) 100 MG tablet Take 100 mg by mouth 2 (two) times daily. 10 day supply   Yes Provider,  Historical, Skinner  FLUoxetine (PROZAC) 20 MG tablet Take 20 mg by mouth daily.   Yes Provider, Historical, Skinner  insulin regular human CONCENTRATED (HUMULIN R U-500 KWIKPEN) 500 UNIT/ML kwikpen Inject 40-50 Units into the skin 3 (three) times daily with meals. 03/06/20  Yes Michelle Skinner  levothyroxine (SYNTHROID) 137 MCG tablet TAKE ONE (1) TABLET EACH DAY Patient taking differently: Take 137 mcg by mouth daily before breakfast. TAKE ONE (1) TABLET EACH DAY 03/06/20  Yes Michelle Skinner  lisinopril (ZESTRIL) 20 MG tablet Take 1 tablet (20 mg total) by mouth daily. 07/12/19  Yes Michelle Skinner  metoprolol succinate (TOPROL-XL) 100 MG 24 hr tablet Take 100 mg by mouth daily. 06/10/19  Yes Provider, Historical, Skinner  Multiple Vitamin (MULTIVITAMIN WITH MINERALS) TABS tablet Take 1 tablet by mouth daily.   Yes Provider, Historical, Skinner  neomycin-bacitracin-polymyxin (NEOSPORIN) ointment Apply 1 application topically every 12 (twelve) hours. To legs   Yes Provider, Historical, Skinner  pantoprazole (PROTONIX) 40 MG tablet TAKE ONE (1) TABLET EACH DAY Patient taking differently: Take 40 mg by mouth daily.  12/01/18  Yes Michelle Skinner  Thiamine HCl (VITAMIN B-1 PO) Take 1 tablet by mouth daily.   Yes Provider, Historical, Skinner  VITAMIN A PO Take 1 tablet by mouth daily.   Yes Provider, Historical, Skinner  VITAMIN E PO Take 1 tablet by mouth daily.   Yes Provider, Historical, Skinner  Continuous Blood Gluc Receiver (FREESTYLE LIBRE 14 DAY READER) DEVI by Does not apply route 4 (four) times daily.    Provider, Historical, Skinner  Continuous Blood Gluc Receiver (FREESTYLE LIBRE 2 READER) DEVI As directed 03/06/20   Michelle Skinner  Continuous Blood Gluc Sensor (FREESTYLE LIBRE 2 SENSOR) MISC 1 Piece by Does not apply route every 14 (fourteen) days. 03/06/20   Michelle Skinner  omeprazole (PRILOSEC) 20 MG capsule Take 1 capsule (20 mg total) by mouth 2 (two) times daily before a  meal. For 10 days while on Pylera for H.pylori Patient not taking: Reported on 04/27/2020 03/05/18   Michelle Menghini, PA-C  Vitamin D, Ergocalciferol, (DRISDOL) 1.25 MG (50000 UNIT) CAPS capsule Take 1 capsule (50,000 Units total) by mouth every 7 (seven) days. Patient not taking: Reported on 04/27/2020 11/15/19   Michelle Skinner    Allergies    Sulfa antibiotics, Bactrim [sulfamethoxazole-trimethoprim], Cherry, Gabapentin, Invokana [canagliflozin], and Other  Review of Systems   Review of Systems  Skin: Positive for color change and rash.  All other systems reviewed and are negative.   Physical Exam Updated Vital Signs BP (!) 190/88   Pulse 81   Temp 98.1 F (36.7 C) (Oral)   Resp  16   Ht 5' 7.5" (1.715 m)   Wt (!) 170.1 kg   SpO2 100%   BMI 57.87 kg/m   Physical Exam Vitals and nursing note reviewed.  Constitutional:      Appearance: She is obese.  HENT:     Head: Normocephalic and atraumatic.     Right Ear: External ear normal.     Left Ear: External ear normal.     Nose: Nose normal.     Mouth/Throat:     Mouth: Mucous membranes are moist.     Pharynx: Oropharynx is clear.  Eyes:     Extraocular Movements: Extraocular movements intact.     Conjunctiva/sclera: Conjunctivae normal.     Pupils: Pupils are equal, round, and reactive to light.  Cardiovascular:     Rate and Rhythm: Regular rhythm. Tachycardia present.     Pulses: Normal pulses.     Heart sounds: Normal heart sounds.  Pulmonary:     Effort: Pulmonary effort is normal.     Breath sounds: Normal breath sounds.  Abdominal:     General: Abdomen is flat. Bowel sounds are normal.     Palpations: Abdomen is soft.  Musculoskeletal:     Cervical back: Normal range of motion and neck supple.     Right lower leg: Edema present.     Left lower leg: Edema present.  Skin:    Capillary Refill: Capillary refill takes less than 2 seconds.     Comments: See pictures  Neurological:     General: No  focal deficit present.     Mental Status: She is alert and oriented to person, place, and time.  Psychiatric:        Mood and Affect: Mood normal.        Behavior: Behavior normal.             ED Results / Procedures / Treatments   Labs (all labs ordered are listed, but only abnormal results are displayed) Labs Reviewed  COMPREHENSIVE METABOLIC PANEL - Abnormal; Notable for the following components:      Result Value   Glucose, Bld 183 (*)    BUN 24 (*)    Creatinine, Ser 1.30 (*)    Albumin 2.6 (*)    AST 14 (*)    GFR, Estimated 49 (*)    All other components within normal limits  CBC WITH DIFFERENTIAL/PLATELET - Abnormal; Notable for the following components:   Hemoglobin 11.5 (*)    Neutro Abs 7.8 (*)    All other components within normal limits  CULTURE, BLOOD (ROUTINE X 2)  CULTURE, BLOOD (ROUTINE X 2)  LACTIC ACID, PLASMA  LACTIC ACID, PLASMA    EKG None  Radiology No results found.  Procedures Procedures (including critical care time)  Medications Ordered in ED Medications  vancomycin (VANCOREADY) IVPB 2000 mg/400 mL (2,000 mg Intravenous New Bag/Given 04/27/20 1003)  vancomycin (VANCOREADY) IVPB 1750 mg/350 mL (has no administration in time range)  atorvastatin (LIPITOR) tablet 20 mg (has no administration in time range)  carvedilol (COREG) tablet 25 mg (has no administration in time range)  FLUoxetine (PROZAC) capsule 20 mg (has no administration in time range)  levothyroxine (SYNTHROID) tablet 137 mcg (has no administration in time range)  pantoprazole (PROTONIX) EC tablet 40 mg (has no administration in time range)  pregabalin (LYRICA) capsule 75 mg (has no administration in time range)  Vitamin D (Ergocalciferol) (DRISDOL) capsule 50,000 Units (has no administration in time range)  cholecalciferol (VITAMIN D3)  tablet 5,000 Units (has no administration in time range)  insulin regular human CONCENTRATED (HUMULIN R) 500 UNIT/ML kwikpen 50 Units  (has no administration in time range)  insulin regular human CONCENTRATED (HUMULIN R) 500 UNIT/ML kwikpen 15 Units (has no administration in time range)  insulin regular human CONCENTRATED (HUMULIN R) 500 UNIT/ML kwikpen 40 Units (has no administration in time range)  0.9 %  sodium chloride infusion ( Intravenous New Bag/Given 04/27/20 0848)  ceFAZolin (ANCEF) IVPB 2g/100 mL premix (0 g Intravenous Stopped 04/27/20 0940)  acetaminophen (TYLENOL) tablet 1,000 mg (1,000 mg Oral Given 04/27/20 1024)    ED Course  I have reviewed the triage vital signs and the nursing notes.  Pertinent labs & imaging results that were available during my care of the patient were reviewed by me and considered in my medical decision making (see chart for details).    MDM Rules/Calculators/A&P                          Pt is a poorly controlled diabetic with bilateral cellulitis that is not improving with oral antibiotic treatment.  She requires admission for IV abx.  Pt d/w IMTS for admission.  Final Clinical Impression(s) / ED Diagnoses Final diagnoses:  Cellulitis of right lower extremity  Cellulitis of left lower extremity  Lymphedema of both lower extremities  Poorly controlled type 2 diabetes mellitus ([place])    Rx / [place] Orders ED Discharge Orders    None       Isla Pence, Skinner 04/27/20 1110

## 2020-04-28 ENCOUNTER — Encounter (HOSPITAL_COMMUNITY): Payer: Self-pay | Admitting: Internal Medicine

## 2020-04-28 LAB — CBC WITH DIFFERENTIAL/PLATELET
Abs Immature Granulocytes: 0.04 10*3/uL (ref 0.00–0.07)
Basophils Absolute: 0 10*3/uL (ref 0.0–0.1)
Basophils Relative: 0 %
Eosinophils Absolute: 0.1 10*3/uL (ref 0.0–0.5)
Eosinophils Relative: 1 %
HCT: 33.1 % — ABNORMAL LOW (ref 36.0–46.0)
Hemoglobin: 10.2 g/dL — ABNORMAL LOW (ref 12.0–15.0)
Immature Granulocytes: 0 %
Lymphocytes Relative: 15 %
Lymphs Abs: 1.3 10*3/uL (ref 0.7–4.0)
MCH: 27.3 pg (ref 26.0–34.0)
MCHC: 30.8 g/dL (ref 30.0–36.0)
MCV: 88.5 fL (ref 80.0–100.0)
Monocytes Absolute: 0.9 10*3/uL (ref 0.1–1.0)
Monocytes Relative: 10 %
Neutro Abs: 6.5 10*3/uL (ref 1.7–7.7)
Neutrophils Relative %: 74 %
Platelets: 314 10*3/uL (ref 150–400)
RBC: 3.74 MIL/uL — ABNORMAL LOW (ref 3.87–5.11)
RDW: 15.7 % — ABNORMAL HIGH (ref 11.5–15.5)
WBC: 8.9 10*3/uL (ref 4.0–10.5)
nRBC: 0 % (ref 0.0–0.2)

## 2020-04-28 LAB — BASIC METABOLIC PANEL
Anion gap: 9 (ref 5–15)
BUN: 27 mg/dL — ABNORMAL HIGH (ref 6–20)
CO2: 27 mmol/L (ref 22–32)
Calcium: 8.9 mg/dL (ref 8.9–10.3)
Chloride: 103 mmol/L (ref 98–111)
Creatinine, Ser: 1.33 mg/dL — ABNORMAL HIGH (ref 0.44–1.00)
GFR, Estimated: 48 mL/min — ABNORMAL LOW (ref >60)
Glucose, Bld: 67 mg/dL — ABNORMAL LOW (ref 70–99)
Potassium: 4.4 mmol/L (ref 3.5–5.1)
Sodium: 139 mmol/L (ref 135–145)

## 2020-04-28 LAB — GLUCOSE, CAPILLARY
Glucose-Capillary: 149 mg/dL — ABNORMAL HIGH (ref 70–99)
Glucose-Capillary: 211 mg/dL — ABNORMAL HIGH (ref 70–99)
Glucose-Capillary: 75 mg/dL (ref 70–99)
Glucose-Capillary: 139 mg/dL — ABNORMAL HIGH (ref 70–99)

## 2020-04-28 LAB — CBG MONITORING, ED: Glucose-Capillary: 77 mg/dL (ref 70–99)

## 2020-04-28 LAB — LACTIC ACID, PLASMA: Lactic Acid, Venous: 1.8 mmol/L (ref 0.5–1.9)

## 2020-04-28 MED ORDER — INFLUENZA VAC SPLIT QUAD 0.5 ML IM SUSY
0.5000 mL | PREFILLED_SYRINGE | INTRAMUSCULAR | Status: AC
  Administered 2020-04-30: 0.5 mL via INTRAMUSCULAR
  Filled 2020-04-28: qty 0.5

## 2020-04-28 MED ORDER — ENOXAPARIN SODIUM 80 MG/0.8ML ~~LOC~~ SOLN
80.0000 mg | Freq: Every day | SUBCUTANEOUS | Status: DC
  Administered 2020-04-28 – 2020-05-04 (×7): 80 mg via SUBCUTANEOUS
  Filled 2020-04-28 (×7): qty 0.8

## 2020-04-28 NOTE — Progress Notes (Signed)
HD#1 Subjective:  Overnight Events: None  Patient resting in bed comfortably. Continues to endorse lower extremity pain. She states that in July she noticed pustules on her lower extremities with clear fluid. She noticed that the fluid became thicker and light green color. She was treated with a 2 week course of keflex and while she had nausea/vomiting during the course of the antibiotics, she completed the course. She noticed the fluid went back to being clear in consistency, but the fluid then became thicker, green, and foul smelling. She was put on doxycycline for 2 weeks. After this 2 week course, she noticed improvement, but the thick, green color drainage returned. At this time the patient's primary care provider sent her to the ED for IV antibiotics. She states she does the wound care wraps herself because there is no wound care where she lives in [place].   She continues to deny fever, cough, worsening lower extremity pain. She does endorse feeling cold more since she has been admitted. She endorses abrasion of right lower extremity while walking to the bathroom yesterday evening.   When asked about her diabetes medication, the patient states she has tried a majority of oral diabetes medications that led to the patient having increased nausea, vomiting. She states her endocrinologist started her on U500 insulin.   Objective:  Vital signs in last 24 hours: Vitals:   04/28/20 0300 04/28/20 0315 04/28/20 0415 04/28/20 0430  BP: (!) 152/65 139/66 (!) 145/65 (!) 144/66  Pulse: 65 65 63 64  Resp:  16  16  Temp:      TempSrc:      SpO2: 96% 95% 95% 97%  Weight:      Height:       Supplemental O2: Room Air SpO2: 97 %  Physical Exam:  Physical Exam Vitals and nursing note reviewed.  Constitutional:      General: She is not in acute distress.    Appearance: She is obese. She is not ill-appearing or toxic-appearing.  Cardiovascular:     Rate and Rhythm: Normal rate and regular  rhythm.     Heart sounds: No murmur heard.  No friction rub. No gallop.      Comments: 1+ dorsalis pedis pulses bilaterally.  Pulmonary:     Effort: Pulmonary effort is normal. No respiratory distress.  Musculoskeletal:     Right lower leg: Edema present.     Left lower leg: Edema present.     Comments: Lower extremities wrapped with gauze. Abrasion on lateral surface of right ankle.   Neurological:     General: No focal deficit present.     Mental Status: She is alert and oriented to person, place, and time.  Psychiatric:        Mood and Affect: Mood normal.        Behavior: Behavior normal.     Filed Weights   04/27/20 0739  Weight: (!) 170.1 kg     Intake/Output Summary (Last 24 hours) at 04/28/2020 0619 Last data filed at 04/27/2020 1206 Gross per 24 hour  Intake 500 ml  Output --  Net 500 ml   Net IO Since Admission: 500 mL [04/28/20 0619]  Pertinent Labs: CBC Latest Ref Rng & Units 04/28/2020 04/27/2020 05/18/2018  WBC 4.0 - 10.5 K/uL 8.9 9.8 9.6  Hemoglobin 12.0 - 15.0 g/dL 10.2(L) 11.5(L) 11.5(L)  Hematocrit 36 - 46 % 33.1(L) 37.4 38.4  Platelets 150 - 400 K/uL 314 359 411(H)    CMP Latest  Ref Rng & Units 04/28/2020 04/27/2020 11/15/2019  Glucose 70 - 99 mg/dL 67(L) 183(H) -  BUN 6 - 20 mg/dL 27(H) 24(H) 18  Creatinine 0.44 - 1.00 mg/dL 1.33(H) 1.30(H) 0.9  Sodium 135 - 145 mmol/L 139 135 -  Potassium 3.5 - 5.1 mmol/L 4.4 4.5 -  Chloride 98 - 111 mmol/L 103 99 -  CO2 22 - 32 mmol/L 27 24 -  Calcium 8.9 - 10.3 mg/dL 8.9 9.4 -  Total Protein 6.5 - 8.1 g/dL - 7.4 -  Total Bilirubin 0.3 - 1.2 mg/dL - 0.5 -  Alkaline Phos 38 - 126 U/L - 100 -  AST 15 - 41 U/L - 14(L) -  ALT 0 - 44 U/L - 13 -    Imaging: No results found.  Assessment/Plan:   Active Problems:   Uncontrolled type 2 diabetes mellitus with complication, with long-term current use of insulin ([facility])   Morbid obesity due to excess calories ([place])   Cellulitis   AKI (acute kidney injury)  ([place])   Stasis dermatitis   Patient Summary: Michelle Skinner is a 54 y.o. with a pertinent PMH of uncontrolled type 2 DM, stasis dermatitis who presented to the ED with worsening lower extremity swelling thought to be cellulitis admitted for IV antibiotics after failing outpatient treatment  Bilateral Lower Extremity Cellulitis Bilateral lower extremity stasis dermatitis Lymphedema Despite lack of systemic symptoms, after discussing with patient the changes in consistency of draining fluid from her lower extremities and improvement with some antibiotics, believe this is infectious etiology in nature. Thus, IV antibiotics are warranted at this time since she failed outpatient therapy for what appears to be true cellulitis. Suspect recurrent infections due to poor blood flow, unsterile dressing changes, and inadequate absorption of PO antibiotics with patient's history of gastroparesis. Once patient is able to tolerate it, will apply unna boot. Continues to deny systemic symptoms -continue vancomycin for 4 days total, day 2/4. (stop 10/24) -convert to oral antibiotic linezolid at discharge -elevate lower extremities -monitor for systemic symptoms -wound care changes per nursing -continue to follow with wound care center in [facility] -follow up with PCP for podiatry follow up  Uncontrolled type 2 diabetes  Glucose of 77 this morning. Patient continued on home medications during admission. Followed by endocrinology on outpatient basis. -continue U500 insulin -follow up with endocrinologist and pcp  Diet: Heart Healthy IVF: None,None VTE: Enoxaparin Code: Full PT/OT recs: None, none.  Dispo: Anticipated discharge to Home in 0-1.   Sanjuana Letters DO Internal Medicine Resident PGY-1 Pager [phone] Please contact the on call pager after 5 pm and on weekends at [phone].

## 2020-04-28 NOTE — Consult Note (Addendum)
[facility] Nurse Consult Note: Patient now receiving care in [facility] [phone] [place] was consulted on 04/27/20. Please see note 10/21 @ 11:53.  Bedside nurse to apply Xeroform gauze and cover with 4 x 4 or ABD pad and wrap with Kerlix. Change daily or PRN weeping/soiling. Nursing order placed [facility] resources in note by [facility].  Thank you for the consult. [place] nurse will not follow at this time.   Please re-consult the [place] team if needed.  Cathlean Marseilles Tamala Julian, MSN, RN, [place], Lysle Pearl, [facility] Wound Treatment Associate Pager [phone]

## 2020-04-28 NOTE — Progress Notes (Signed)
Patient arrived to 8K88 from ED. Report received from [place], [place]. Patient alert and oriented x4. Dressings applied to bilateral lower extremities; xeroform, abd  pads and kerlix wrap. See assessment.  Patients call bell within reach. Will continue to monitor.

## 2020-04-28 NOTE — ED Notes (Signed)
Pt ambulatory to bathroom, nadn. Ambulatory with steady gait.

## 2020-04-29 LAB — CBC
HCT: 32 % — ABNORMAL LOW (ref 36.0–46.0)
Hemoglobin: 10 g/dL — ABNORMAL LOW (ref 12.0–15.0)
MCH: 27 pg (ref 26.0–34.0)
MCHC: 31.3 g/dL (ref 30.0–36.0)
MCV: 86.3 fL (ref 80.0–100.0)
Platelets: 312 10*3/uL (ref 150–400)
RBC: 3.71 MIL/uL — ABNORMAL LOW (ref 3.87–5.11)
RDW: 15.9 % — ABNORMAL HIGH (ref 11.5–15.5)
WBC: 5.7 10*3/uL (ref 4.0–10.5)
nRBC: 0 % (ref 0.0–0.2)

## 2020-04-29 LAB — GLUCOSE, CAPILLARY
Glucose-Capillary: 293 mg/dL — ABNORMAL HIGH (ref 70–99)
Glucose-Capillary: 251 mg/dL — ABNORMAL HIGH (ref 70–99)
Glucose-Capillary: 104 mg/dL — ABNORMAL HIGH (ref 70–99)
Glucose-Capillary: 157 mg/dL — ABNORMAL HIGH (ref 70–99)

## 2020-04-29 MED ORDER — IBUPROFEN 600 MG PO TABS
600.0000 mg | ORAL_TABLET | Freq: Four times a day (QID) | ORAL | Status: DC | PRN
  Administered 2020-04-29 – 2020-05-04 (×11): 600 mg via ORAL
  Filled 2020-04-29 (×12): qty 1

## 2020-04-29 NOTE — Progress Notes (Addendum)
HD#2 Subjective:  Overnight Events: None  Michelle Skinner was seen and evaluated at bedside this AM. States that she is still sore from her lower extremities were sore and painful from the bandages pulling off skin, but is otherwise doing well. Denies fevers, nausea/vomiting. We discussed our currently plan. All questions and concerns were addressed at bedside.   Objective:  Vital signs in last 24 hours: Vitals:   04/28/20 1903 04/28/20 2111 04/29/20 0136 04/29/20 0429  BP: 136/76 (!) 152/63 (!) 145/68 (!) 148/51  Pulse: 72 64 70 62  Resp: 18 17 18 17   Temp: 98.7 F (37.1 C) (!) 97.5 F (36.4 C) 98.1 F (36.7 C) 98.1 F (36.7 C)  TempSrc: Oral Oral Oral Oral  SpO2: 99% 97% 98% 99%  Weight:      Height:       Supplemental O2: Room Air SpO2: 99 % O2 Flow Rate (L/min): 0 L/min  Physical Exam:  Physical Exam Constitutional:      Appearance: She is obese. She is ill-appearing. She is not toxic-appearing or diaphoretic.  Cardiovascular:     Rate and Rhythm: Normal rate and regular rhythm.     Pulses: Normal pulses.     Heart sounds: Normal heart sounds. No murmur heard.  No gallop.   Skin:    Comments: Erythema mildly improved from admission. No signs of purulent drainage. Fresh blood on the left posterior calf.  Multiple bullae bilaterally.   Neurological:     Mental Status: She is alert and oriented to person, place, and time.           Filed Weights   04/27/20 0739  Weight: (!) 170.1 kg     Intake/Output Summary (Last 24 hours) at 04/29/2020 0620 Last data filed at 04/28/2020 1800 Gross per 24 hour  Intake 800 ml  Output --  Net 800 ml   Net IO Since Admission: 1,300 mL [04/29/20 0620]  Pertinent Labs: CBC Latest Ref Rng & Units 04/28/2020 04/27/2020 05/18/2018  WBC 4.0 - 10.5 K/uL 8.9 9.8 9.6  Hemoglobin 12.0 - 15.0 g/dL 10.2(L) 11.5(L) 11.5(L)  Hematocrit 36 - 46 % 33.1(L) 37.4 38.4  Platelets 150 - 400 K/uL 314 359 411(H)    CMP Latest Ref Rng  & Units 04/28/2020 04/27/2020 11/15/2019  Glucose 70 - 99 mg/dL 67(L) 183(H) -  BUN 6 - 20 mg/dL 27(H) 24(H) 18  Creatinine 0.44 - 1.00 mg/dL 1.33(H) 1.30(H) 0.9  Sodium 135 - 145 mmol/L 139 135 -  Potassium 3.5 - 5.1 mmol/L 4.4 4.5 -  Chloride 98 - 111 mmol/L 103 99 -  CO2 22 - 32 mmol/L 27 24 -  Calcium 8.9 - 10.3 mg/dL 8.9 9.4 -  Total Protein 6.5 - 8.1 g/dL - 7.4 -  Total Bilirubin 0.3 - 1.2 mg/dL - 0.5 -  Alkaline Phos 38 - 126 U/L - 100 -  AST 15 - 41 U/L - 14(L) -  ALT 0 - 44 U/L - 13 -    Imaging: No results found.  Assessment/Plan:   Active Problems:   Uncontrolled type 2 diabetes mellitus with complication, with long-term current use of insulin ([place])   Morbid obesity due to excess calories ([place])   Cellulitis   AKI (acute kidney injury) ([place])   Stasis dermatitis   Patient Summary: Michelle Skinner is a 54 y.o. with a pertinent PMH of uncontrolled type 2 DM, stasis dermatitis who presented to the ED with worsening lower extremity swelling thought to be  cellulitis admitted for IV antibiotics after failing outpatient treatment  Bilateral Lower Extremity Cellulitis Bilateral lower extremity stasis dermatitis Lymphedema Patient narrative lends itself to infectious etiology in nature, despite lack of symptoms. Likely recurrent infections in the setting ofdue to poor blood flow, unsterile dressing changes, and inadequate absorption of PO antibiotics with patient's history of gastroparesis. Once patient is able to tolerate it, will apply unna boot. Continuing Vanc with transition to linezolid to finish treatment. -continue vancomycin day 3/4. (stop 10/24) -convert to oral antibiotic linezolid at DC - elevate lower extremities - ABI for compression therapy. Ordered. -monitor for systemic symptoms -wound care changes per nursing -continue to follow with wound care center in [facility] -follow up with PCP for podiatry follow up  Uncontrolled type 2 diabetes  Patient continued  on home medications during admission. Followed by endocrinology on outpatient basis. -continue U500 insulin -follow up with endocrinologist and pcp  Diet: Heart Healthy IVF: None,None VTE: Enoxaparin Code: Full PT/OT recs: None, none.  Dispo: Anticipated discharge to Home in 2-3.   Maudie Mercury, MD IMTS, PGY-2 Pager: [phone] 04/29/2020,11:18 AM Please contact the on call pager after 5 pm and on weekends at [phone].   Internal Medicine Attending:   I saw and examined the patient. I reviewed the resident's note and I agree with the resident's findings and plan as documented in the resident's note.  Some improvement in errythema especially on dorsal aspect of feet and proximal upper legs.  Plan to continue vancomycin for now, obtain ABI and if normal start compression therapy.

## 2020-04-30 LAB — CREATININE, SERUM
Creatinine, Ser: 1.49 mg/dL — ABNORMAL HIGH (ref 0.44–1.00)
GFR, Estimated: 42 mL/min — ABNORMAL LOW (ref >60)

## 2020-04-30 LAB — VANCOMYCIN, TROUGH: Vancomycin Tr: 15 ug/mL (ref 15–20)

## 2020-04-30 LAB — GLUCOSE, CAPILLARY
Glucose-Capillary: 221 mg/dL — ABNORMAL HIGH (ref 70–99)
Glucose-Capillary: 247 mg/dL — ABNORMAL HIGH (ref 70–99)
Glucose-Capillary: 83 mg/dL (ref 70–99)
Glucose-Capillary: 146 mg/dL — ABNORMAL HIGH (ref 70–99)

## 2020-04-30 MED ORDER — CHLORHEXIDINE GLUCONATE CLOTH 2 % EX PADS
6.0000 | MEDICATED_PAD | Freq: Every day | CUTANEOUS | Status: DC
  Administered 2020-05-02 – 2020-05-03 (×2): 6 via TOPICAL

## 2020-04-30 MED ORDER — LACTATED RINGERS IV SOLN: INTRAVENOUS | Status: AC
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
## 2020-04-30 NOTE — Progress Notes (Signed)
Inpatient Diabetes Program Recommendations  AACE/ADA: New Consensus Statement on Inpatient Glycemic Control (2015)  Target Ranges:  Prepandial:   less than 140 mg/dL      Peak postprandial:   less than 180 mg/dL (1-2 hours)      Critically ill patients:  140 - 180 mg/dL   Results for Michelle Skinner, Michelle Skinner (MRN [id number]) as of 04/30/2020 13:11  Ref. Range 04/29/2020 07:37 04/29/2020 11:21 04/29/2020 17:01 04/29/2020 22:11  Glucose-Capillary Latest Ref Range: 70 - 99 mg/dL 293 (H) 251 (H) 104 (H) 157 (H)   Results for Michelle Skinner, Michelle Skinner (MRN [id number]) as of 04/30/2020 13:11  Ref. Range 04/30/2020 09:25 04/30/2020 11:39  Glucose-Capillary Latest Ref Range: 70 - 99 mg/dL 221 (H) 247 (H)    Home DM Meds: Same as Below  Current Insulin Orders: U500 Concentrated  Insulin:      50 units with Breakfast        15 units with Lunch      40 units with Dinner    MD- Note CBGs have been elevated >200 Prior to Breakfast and Lunch the last 2 days.  May consider the following:  1. Increase 5pm dose of the U500 Insulin to 45 units  2. Increase the 7am dose of U500 Insulin to 55 units  3. May also consider adding Novolog Moderate Correction Scale/ SSI (0-15 units) TID AC + HS    --Will follow patient during hospitalization--  Wyn Quaker RN, MSN, CDE Diabetes Coordinator Inpatient Glycemic Control Team Team Pager: [phone] (8a-5p)

## 2020-04-30 NOTE — Progress Notes (Addendum)
Pharmacy Antibiotic Note  Michelle Skinner is a 54 y.o. female admitted on 04/27/2020 with cellulitis.  Pharmacy has been consulted for vancomycin dosing.  Patient is afebrile, WBC wnl. SCr above baseline at 1.49; LR infusion has been ordered for hydration. Some improvement in erythema.   Noted primary team's plan to finish vancomycin today and switch to linezolid upon discharge. Patient is on fluoxetine which is contraindicated with linezolid. Unfortunately patient has failed/not tolerated multiple outpatient antibiotics (Augmentin, Keflex, doxycycline) and patient is allergic to Bactrim. Could consider clindamycin for MRSA coverage.  Plan: Continue vancomycin 1750mg  IV Q24H F/u plan for switching to PO abx  Addendum: Vanc trough is at goal at 15 mcg/mL. Can continue at vancomycin 1750 mg IV q24h if not switching to PO abx.  Height: 5' 7.5" (171.5 cm) Weight: (!) 170.1 kg (375 lb) IBW/kg (Calculated) : 62.75  Temp (24hrs), Avg:98.2 F (36.8 C), Min:97.4 F (36.3 C), Max:99.1 F (37.3 C)  Recent Labs  Lab 04/27/20 0741 04/27/20 0942 04/28/20 0226 04/29/20 0807 04/30/20 0150  WBC 9.8  --  8.9 5.7  --   CREATININE 1.30*  --  1.33*  --  1.49*  LATICACIDVEN  --  1.4 1.8  --   --     Estimated Creatinine Clearance: 73.7 mL/min (A) (by C-G formula based on SCr of 1.49 mg/dL (H)).    Allergies  Allergen Reactions  . Sulfa Antibiotics Rash  . Bactrim [Sulfamethoxazole-Trimethoprim] Itching  . Cherry Other (See Comments)    Unknown  . Gabapentin Other (See Comments)    Unknown  . Invokana [Canagliflozin] Other (See Comments)    Unknown  . Other     Hot peppers    Antimicrobials this admission: Vanc 10/21>> Cefazolin x 1 10/21  Microbiology results: 11/21 BCx: NGTD  Thank you for allowing pharmacy to be a part of this patient's care.  Berenice Bouton, PharmD PGY2 Pharmacy Resident Phone between 7 am - 3:30 pm: [phone]  Please check AMION for all [place] phone  numbers After 10:00 PM, call [place] [phone]  04/30/2020 8:30 AM

## 2020-04-30 NOTE — Consult Note (Signed)
[facility] Nurse Consult Note: Reason for Consult: Wrap LEs Wound type:Venous insufficiency Patient was seen by my associate, Marlou Porch on 10/21 and she provided guidance for the conservative care of the LEs by the Nursing staff.  [facility] nursing team will not follow, but will remain available to this patient, the nursing and medical teams.  Please re-consult if needed. Thanks, Maudie Flakes, MSN, RN, [place], Arther Abbott  Pager# [phone]

## 2020-05-01 ENCOUNTER — Inpatient Hospital Stay (HOSPITAL_COMMUNITY): Payer: Medicare Other

## 2020-05-01 LAB — GLUCOSE, CAPILLARY
Glucose-Capillary: 169 mg/dL — ABNORMAL HIGH (ref 70–99)
Glucose-Capillary: 232 mg/dL — ABNORMAL HIGH (ref 70–99)
Glucose-Capillary: 53 mg/dL — ABNORMAL LOW (ref 70–99)
Glucose-Capillary: 67 mg/dL — ABNORMAL LOW (ref 70–99)
Glucose-Capillary: 135 mg/dL — ABNORMAL HIGH (ref 70–99)
Glucose-Capillary: 207 mg/dL — ABNORMAL HIGH (ref 70–99)

## 2020-05-01 LAB — CREATININE, SERUM
Creatinine, Ser: 1.36 mg/dL — ABNORMAL HIGH (ref 0.44–1.00)
GFR, Estimated: 47 mL/min — ABNORMAL LOW (ref >60)

## 2020-05-01 MED ORDER — IOHEXOL 300 MG/ML  SOLN
70.0000 mL | Freq: Once | INTRAMUSCULAR | Status: AC | PRN
  Administered 2020-05-01: 70 mL via INTRAVENOUS

## 2020-05-01 MED ORDER — LACTATED RINGERS IV SOLN: INTRAVENOUS | Status: AC

## 2020-05-01 NOTE — Progress Notes (Signed)
HD#4 Subjective:  Overnight Events: None   Patient reports her night was "not so good" due to pain in her legs. Notes that ibuprofen "takes the edge off." She endorses bilateral lower extremity pain, worse on the right than the left. When asked where the pain is the worst, the patient points to the back of her legs. States prior to the leg infection, she couldn't feel her legs.   Reports the swelling had improved but have gone back up again. Notes legs have been elevated with pillows and with foot of bed relatively  Elevated. Blisters on top of R ankle "busted just as soon as they came up." Also reports tingling and burning on her left side.   Patient ambulated to bathroom with walker, did not have pain during ambulation.    Objective:  Vital signs in last 24 hours: Vitals:   04/30/20 1246 04/30/20 1423 04/30/20 2021 05/01/20 0444  BP: (!) 158/71 (!) 146/47 (!) 143/65 (!) 134/55  Pulse: 65 65 66 65  Resp: 17 18 18 20   Temp:  97.7 F (36.5 C) 98.1 F (36.7 C) 97.7 F (36.5 C)  TempSrc:   Oral   SpO2: 99% 100% 100% 100%  Weight:      Height:       Supplemental O2: Room Air SpO2: 100 % O2 Flow Rate (L/min): 0 L/min  Physical Exam:  Physical Exam Vitals and nursing note reviewed.  Constitutional:      Appearance: She is obese.  Cardiovascular:     Rate and Rhythm: Normal rate and regular rhythm.     Heart sounds: No murmur heard.   Pulmonary:     Effort: Pulmonary effort is normal. No respiratory distress.  Musculoskeletal:     Right lower leg: Edema present.     Left lower leg: Edema present.  Skin:    Findings: Erythema present.     Comments: Lower extremity erythema improving bilaterally. Patient continues to be tender over posterior calves bilaterally  Neurological:     General: No focal deficit present.     Mental Status: She is alert and oriented to person, place, and time. Mental status is at baseline.  Psychiatric:        Mood and Affect: Mood normal.          Behavior: Behavior normal.     Filed Weights   04/27/20 0739  Weight: (!) 170.1 kg     Intake/Output Summary (Last 24 hours) at 05/01/2020 0637 Last data filed at 04/30/2020 2157 Gross per 24 hour  Intake 1880 ml  Output --  Net 1880 ml   Net IO Since Admission: 3,920 mL [05/01/20 0637]  Pertinent Labs: CBC Latest Ref Rng & Units 04/29/2020 04/28/2020 04/27/2020  WBC 4.0 - 10.5 K/uL 5.7 8.9 9.8  Hemoglobin 12.0 - 15.0 g/dL 10.0(L) 10.2(L) 11.5(L)  Hematocrit 36 - 46 % 32.0(L) 33.1(L) 37.4  Platelets 150 - 400 K/uL 312 314 359    CMP Latest Ref Rng & Units 05/01/2020 04/30/2020 04/28/2020  Glucose 70 - 99 mg/dL - - 67(L)  BUN 6 - 20 mg/dL - - 27(H)  Creatinine 0.44 - 1.00 mg/dL 1.36(H) 1.49(H) 1.33(H)  Sodium 135 - 145 mmol/L - - 139  Potassium 3.5 - 5.1 mmol/L - - 4.4  Chloride 98 - 111 mmol/L - - 103  CO2 22 - 32 mmol/L - - 27  Calcium 8.9 - 10.3 mg/dL - - 8.9  Total Protein 6.5 - 8.1 g/dL - - -  Total Bilirubin 0.3 - 1.2 mg/dL - - -  Alkaline Phos 38 - 126 U/L - - -  AST 15 - 41 U/L - - -  ALT 0 - 44 U/L - - -    Imaging: No results found.  Assessment/Plan:   Active Problems:   Uncontrolled type 2 diabetes mellitus with complication, with long-term current use of insulin (HCC)   Morbid obesity due to excess calories ([place])   Cellulitis   AKI (acute kidney injury) ([place])   Stasis dermatitis  Patient Summary: Michelle Skinner a 54 y.o.with a pertinent PMH of uncontrolled type 2 DM, stasis dermatitis who presented to the ED with worsening lower extremity swelling thought to be cellulitis admitted for IV antibiotics after failing outpatient treatment  Bilateral Lower Extremity Cellulitis Bilateral lower extremitystasis dermatitis Lymphedema Initial plan was to convert patient to linezolid, however, informed by pharmacy risk of serotonin syndrome. Because patient has failed outpatient tx with keflex and doxycycline, will consider clindamycin. Will  continue vancomycin treatment IV while she is admitted.  Unable to apply compression wraps as ABI's cannot be performed due to degree of lower extremity swelling and blisters on posterior calves. Despite improvement of erythema, patient with increased pain today, will perform lower extremity CT imaging with contrast to assess for abscess, osteomyelitis, compartment syndrome.  Patient with social limitations that prevent her from having adequate follow up, will consult PT and assess whether or not patient would benefit for SNF placement.  -continue vancomycin day5/7. (stop 10/27) -transition to clindamycin at discharge if patient has not completed course of abx inpatient -vancomycin trough of 15 -PT consult pending -elevate lower extremities -wound care recommended kerlix and ace bandage daily -TOC/Social work consulted to discuss SNF placement with patient -continue to follow with wound care center in [place] -follow up with PCP for podiatry follow up  Uncontrolled Type 2 Diabetes Gastroparesis Diabetic Neuropathy Patient continued on home medications during admission. Followed by endocrinology on outpatient basis. -continue U500 insulin -follow up with endocrinologist and pcp  Acute Kidney Injury Secondary to Dehydration 1.49>1.36, improved. Increase in creatinine since admission. Baseline approximately .9-1.05. Suspect dehydration and vancomycin contribution -additional LR 73ml for 10 hours  -encourage PO intake -repeat BMP tomorrow morning  Diet: Heart Healthy IVF: None,None VTE: Enoxaparin Code: Full PT/OT recs: None, none.  Dispo: Anticipated discharge to SNF in 1-2 days pending cellulitis treatment.   Sanjuana Letters DO Internal Medicine Resident PGY-1 Pager [phone] Please contact the on call pager after 5 pm and on weekends at [phone].

## 2020-05-01 NOTE — Plan of Care (Signed)
  Problem: Education: Goal: Knowledge of General Education information will improve Description Including pain rating scale, medication(s)/side effects and non-pharmacologic comfort measures Outcome: Progressing   

## 2020-05-01 NOTE — Evaluation (Signed)
Physical Therapy Evaluation Patient Details Name: Michelle Skinner MRN: [id number] DOB: 1966/02/01 Today's Date: 05/01/2020   History of Present Illness  54 yo female with onset of worsening lymphedema, weeping skin on LE's, wounds with stasis dermatitis, and AKI was admitted for cellulitis of LE's.  PMHx:  DM, PN, Charcot feet, lymphedema, cervical CA, HTN, hypothyroidism, gastroparesis  Clinical Impression  Pt was seen for mobility on RW with care to instruct her on how to safely maneuver to set up for standing and moving her legs.  Pt has a belt to assist her legs onto the bed, and was able to tolerate minimally this new technique.  Her ROM on hips is restricted, but is substituting some assist to the hip over her trunk.  Will continue to instruct better body mechanics and better safety with transfers, since pt tends to overexert her shoulders to pull up.  ROM and strength need to be improved for this goal.    Follow Up Recommendations SNF    Equipment Recommendations  None recommended by PT    Recommendations for Other Services       Precautions / Restrictions Precautions Precautions: Fall Precaution Comments: monitor LE's for wounds Restrictions Weight Bearing Restrictions: No      Mobility  Bed Mobility Overal bed mobility: Needs Assistance Bed Mobility: Supine to Sit;Sit to Supine     Supine to sit: Min assist Sit to supine: Min assist   General bed mobility comments: assisting legs due to her L trunk pain    Transfers Overall transfer level: Needs assistance Equipment used: Rolling walker (2 wheeled);1 person hand held assist Transfers: Sit to/from Stand Sit to Stand: Min assist         General transfer comment: pt can pull up on bars but needs help to power up without them  Ambulation/Gait Ambulation/Gait assistance: Min guard Gait Distance (Feet): 25 Feet Assistive device: Rolling walker (2 wheeled) Gait Pattern/deviations: Step-through  pattern;Decreased dorsiflexion - right;Decreased stride length;Decreased dorsiflexion - left;Wide base of support Gait velocity: reduced   General Gait Details: altered gait over edema, limited ankle DF and reduction of balance  Stairs Stairs:  (deferred due to pain)          Wheelchair Mobility    Modified Rankin (Stroke Patients Only)       Balance Overall balance assessment: Needs assistance Sitting-balance support: Feet supported Sitting balance-Leahy Scale: Fair     Standing balance support: Bilateral upper extremity supported;During functional activity Standing balance-Leahy Scale: Poor                               Pertinent Vitals/Pain Pain Assessment: Faces Faces Pain Scale: Hurts little more Pain Location: BLE wounds Pain Descriptors / Indicators: Tender;Sharp Pain Intervention(s): Limited activity within patient's tolerance;Monitored during session;Repositioned;Patient requesting pain meds-RN notified    Home Living Family/patient expects to be discharged to:: Private residence Living Arrangements: Spouse/significant other Available Help at Discharge: Family;Available 24 hours/day Type of Home: Apartment Home Access: Stairs to enter Entrance Stairs-Rails: Right;Left Entrance Stairs-Number of Steps: >20 Home Layout: One level Home Equipment: Cane - single point;Walker - 2 wheels      Prior Function Level of Independence: Independent with assistive device(s)         Comments: has to climb stairs to enter house, cannot get out even with EMT's     Hand Dominance   Dominant Hand: Right    Extremity/Trunk Assessment   Upper  Extremity Assessment Upper Extremity Assessment: Overall WFL for tasks assessed    Lower Extremity Assessment Lower Extremity Assessment: Generalized weakness    Cervical / Trunk Assessment Cervical / Trunk Assessment: Normal  Communication   Communication: No difficulties  Cognition Arousal/Alertness:  Awake/alert Behavior During Therapy: WFL for tasks assessed/performed Overall Cognitive Status: Within Functional Limits for tasks assessed                                        General Comments General comments (skin integrity, edema, etc.): pt is getting up to walk with assistance, with trunk pain from straining to get legs in and out of bed with edema    Exercises     Assessment/Plan    PT Assessment Patient needs continued PT services  PT Problem List Decreased strength;Decreased range of motion;Decreased activity tolerance;Decreased balance;Decreased mobility;Decreased coordination;Decreased knowledge of use of DME;Decreased safety awareness;Cardiopulmonary status limiting activity;Obesity;Decreased skin integrity;Pain       PT Treatment Interventions DME instruction;Gait training;Stair training;Functional mobility training;Therapeutic activities;Therapeutic exercise;Balance training;Neuromuscular re-education;Patient/family education    PT Goals (Current goals can be found in the Care Plan section)  Acute Rehab PT Goals Patient Stated Goal: get home and get edema managed PT Goal Formulation: With patient Time For Goal Achievement: 05/15/20 Potential to Achieve Goals: Good    Frequency Min 3X/week   Barriers to discharge Inaccessible home environment;Decreased caregiver support home with flights of stairs to enter house    Co-evaluation               AM-PAC PT "6 Clicks" Mobility  Outcome Measure Help needed turning from your back to your side while in a flat bed without using bedrails?: A Little Help needed moving from lying on your back to sitting on the side of a flat bed without using bedrails?: A Little Help needed moving to and from a bed to a chair (including a wheelchair)?: A Little Help needed standing up from a chair using your arms (e.g., wheelchair or bedside chair)?: A Little Help needed to walk in hospital room?: A Little Help  needed climbing 3-5 steps with a railing? : Total 6 Click Score: 16    End of Session Equipment Utilized During Treatment: Gait belt Activity Tolerance: Patient limited by fatigue Patient left: in bed;with call bell/phone within reach Nurse Communication: Mobility status PT Visit Diagnosis: Unsteadiness on feet (R26.81);Muscle weakness (generalized) (M62.81);Difficulty in walking, not elsewhere classified (R26.2);Pain Pain - Right/Left: Left Pain - part of body: Hip (trunk)    Time: 5035-4656 PT Time Calculation (min) (ACUTE ONLY): 38 min   Charges:   PT Evaluation $PT Eval Moderate Complexity: 1 Mod PT Treatments $Gait Training: 8-22 mins $Therapeutic Activity: 8-22 mins       Ramond Dial 05/01/2020, 7:41 PM  Mee Hives, PT MS Acute Rehab Dept. Number: [place] [facility] [place]

## 2020-05-02 DIAGNOSIS — Z794 Long term (current) use of insulin: Secondary | ICD-10-CM

## 2020-05-02 DIAGNOSIS — L03116 Cellulitis of left lower limb: Secondary | ICD-10-CM

## 2020-05-02 DIAGNOSIS — E1143 Type 2 diabetes mellitus with diabetic autonomic (poly)neuropathy: Secondary | ICD-10-CM

## 2020-05-02 DIAGNOSIS — L03115 Cellulitis of right lower limb: Principal | ICD-10-CM

## 2020-05-02 DIAGNOSIS — I890 Lymphedema, not elsewhere classified: Secondary | ICD-10-CM

## 2020-05-02 LAB — CULTURE, BLOOD (ROUTINE X 2)
Culture: NO GROWTH
Culture: NO GROWTH

## 2020-05-02 LAB — GLUCOSE, CAPILLARY
Glucose-Capillary: 220 mg/dL — ABNORMAL HIGH (ref 70–99)
Glucose-Capillary: 140 mg/dL — ABNORMAL HIGH (ref 70–99)
Glucose-Capillary: 87 mg/dL (ref 70–99)
Glucose-Capillary: 96 mg/dL (ref 70–99)

## 2020-05-02 LAB — CREATININE, SERUM
Creatinine, Ser: 1.29 mg/dL — ABNORMAL HIGH (ref 0.44–1.00)
GFR, Estimated: 50 mL/min — ABNORMAL LOW (ref >60)

## 2020-05-02 LAB — SARS CORONAVIRUS 2 BY RT PCR (HOSPITAL ORDER, PERFORMED IN ~~LOC~~ HOSPITAL LAB): SARS Coronavirus 2: NEGATIVE

## 2020-05-02 NOTE — NC FL2 (Signed)
[place] LEVEL OF CARE SCREENING TOOL     IDENTIFICATION  Patient Name: Michelle Skinner Birthdate: February 10, 1966 Sex: female Admission Date (Current Location): 04/27/2020  [facility] and [place] Number:  Herbalist and Address:  [facility], [place] [address], [place], [place] [address]      Provider Number: [id number]  Attending Physician Name and Address:  Lucious Groves, DO  Relative Name and Phone Number:  Riane, Rung (Spouse) [phone] [facility] Phone)    Current Level of Care:   Recommended Level of Care: [place] Prior Approval Number:    Date Approved/Denied:   PASRR Number: [id number]  Discharge Plan: SNF    Current Diagnoses: Patient Active Problem List   Diagnosis Date Noted  . Cellulitis 04/27/2020  . AKI (acute kidney injury) ([place]) 04/27/2020  . Stasis dermatitis 04/27/2020  . Anxiety 03/31/2020  . Depression 03/31/2020  . GERD (gastroesophageal reflux disease) 03/31/2020  . Posttraumatic stress disorder 03/31/2020  . Personal history of noncompliance with medical treatment, presenting hazards to health 02/09/2018  . Abnormal CT of the abdomen 10/20/2017  . Dilated cbd, acquired 10/20/2017  . Essential hypertension, benign 01/14/2017  . Primary hypothyroidism 05/03/2015  . Mixed hyperlipidemia 05/03/2015  . Uncontrolled type 2 diabetes mellitus with complication, with long-term current use of insulin ([place]) 04/25/2015  . Morbid obesity due to excess calories ([place]) 04/25/2015  . Vitamin D deficiency 04/25/2015  . Cervical cancer ([place]) 10/05/2012    Orientation RESPIRATION BLADDER Height & Weight     Self, Time, Situation, Place  Normal Continent Weight: (!) 375 lb (170.1 kg) Height:  5' 7.5" (171.5 cm)  BEHAVIORAL SYMPTOMS/MOOD NEUROLOGICAL BOWEL NUTRITION STATUS   (none)   Continent Diet (see d/c summary)  AMBULATORY STATUS COMMUNICATION OF NEEDS Skin   Extensive Assist Verbally Surgical  wounds (Incision, leg, bilateral; Cellulitis, leg, bilateral)                       Personal Care Assistance Level of Assistance  Bathing, Feeding, Dressing Bathing Assistance: Limited assistance Feeding assistance: Independent Dressing Assistance: Limited assistance     Functional Limitations Info  Sight, Hearing, Speech Sight Info: Adequate Hearing Info: Adequate Speech Info: Adequate    SPECIAL CARE FACTORS FREQUENCY  PT (By licensed PT), OT (By licensed OT)     PT Frequency: 5x/week OT Frequency: 5x/week            Contractures Contractures Info: Not present    Additional Factors Info  Code Status, Allergies Code Status Info: Full code Allergies Info: Sulfa Antibiotics, cherry, gabapentin, invocana, hot peppers, Bactrim (sulfamethoxazole-trimethoprim)           Current Medications (05/02/2020):  This is the current hospital active medication list Current Facility-Administered Medications  Medication Dose Route Frequency Provider Last Rate Last Admin  . acetaminophen (TYLENOL) tablet 650 mg  650 mg Oral Q6H PRN Masoudi, Elhamalsadat, MD   650 mg at 05/02/20 1015   Or  . acetaminophen (TYLENOL) suppository 650 mg  650 mg Rectal Q6H PRN Masoudi, Elhamalsadat, MD      . atorvastatin (LIPITOR) tablet 20 mg  20 mg Oral Daily Masoudi, Elhamalsadat, MD   20 mg at 05/02/20 1014  . carvedilol (COREG) tablet 25 mg  25 mg Oral BID WC Masoudi, Elhamalsadat, MD   25 mg at 05/02/20 0750  . Chlorhexidine Gluconate Cloth 2 % PADS 6 each  6 each Topical Daily Lucious Groves, DO  6 each at 05/02/20 1039  . cholecalciferol (VITAMIN D3) tablet 5,000 Units  5,000 Units Oral Daily Masoudi, Elhamalsadat, MD   5,000 Units at 05/02/20 1015  . enoxaparin (LOVENOX) injection 80 mg  80 mg Subcutaneous Daily Dang, Thuy D, RPH   80 mg at 05/02/20 1015  . FLUoxetine (PROZAC) capsule 40 mg  40 mg Oral Daily Masoudi, Elhamalsadat, MD   40 mg at 05/02/20 1014  . ibuprofen (ADVIL) tablet 600  mg  600 mg Oral Q6H PRN Maudie Mercury, MD   600 mg at 05/02/20 0750  . insulin regular human CONCENTRATED (HUMULIN R) 500 UNIT/ML kwikpen 15 Units  15 Units Subcutaneous QAC lunch Masoudi, Dorthula Rue, MD   15 Units at 05/01/20 1143  . insulin regular human CONCENTRATED (HUMULIN R) 500 UNIT/ML kwikpen 40 Units  40 Units Subcutaneous QAC supper Dewayne Hatch, MD   15 Units at 04/29/20 1705  . insulin regular human CONCENTRATED (HUMULIN R) 500 UNIT/ML kwikpen 50 Units  50 Units Subcutaneous QAC breakfast Dewayne Hatch, MD   50 Units at 05/02/20 0751  . levothyroxine (SYNTHROID) tablet 137 mcg  137 mcg Oral Q0600 Dewayne Hatch, MD   137 mcg at 05/02/20 0513  . ondansetron (ZOFRAN) tablet 4 mg  4 mg Oral Q6H PRN Masoudi, Elhamalsadat, MD       Or  . ondansetron (ZOFRAN) injection 4 mg  4 mg Intravenous Q6H PRN Masoudi, Elhamalsadat, MD      . pantoprazole (PROTONIX) EC tablet 40 mg  40 mg Oral Daily Masoudi, Elhamalsadat, MD   40 mg at 05/02/20 1015  . polyethylene glycol (MIRALAX / GLYCOLAX) packet 17 g  17 g Oral Daily PRN Masoudi, Elhamalsadat, MD      . pregabalin (LYRICA) capsule 75 mg  75 mg Oral Daily PRN Masoudi, Elhamalsadat, MD      . vancomycin (VANCOREADY) IVPB 1750 mg/350 mL  1,750 mg Intravenous Q24H Rumbarger, Valeda Malm, RPH 175 mL/hr at 05/02/20 1037 1,750 mg at 05/02/20 1037     Discharge Medications: Please see discharge summary for a list of discharge medications.  Relevant Imaging Results:  Relevant Lab Results:   Additional Information SSN [place], [place]

## 2020-05-02 NOTE — Care Management Important Message (Signed)
Important Message  Patient Details  Name: Michelle Skinner MRN: [id number] Date of Birth: 1966/06/12   Medicare Important Message Given:  Yes     Orbie Pyo 05/02/2020, 2:15 PM

## 2020-05-02 NOTE — Progress Notes (Signed)
HD#5 Subjective:  Overnight Events: Left sided hip pain    Upon evaluation, patient is sitting up right on the edge of the bed with her daughter at bedside. Patient states she is doing well today and happy to see her daughter. Patient also drew pictures to hang on the front of her door, thanking the staff for helping her. She notes the redness has improved, but she has multiple blisters that are purple in color near her right/left ankles. She also endorses pain in the left hip that has occurred since she elevated her legs, she notes it feels as though fluid has moved from her lower legs to her hips.   We encouraged the patient to continue to elevate her legs and endorsed that with increased walking and exercising with physical therapy we feel as though her left hip pain will improve. She endorses mild improvement of pain with ibuprofen.  Patient is agreeable to being discharged to SNF facility for further rehabilitation. Patient was concerned that she would be discharged home and be unable to use the stairwell to her apartment and that it is too small for a walker. She endorsed relief once we dicussed physical therapy will help strengthen her muscles  Objective:  Vital signs in last 24 hours: Vitals:   05/01/20 2119 05/02/20 0435 05/02/20 0745 05/02/20 1306  BP: (!) 168/66 (!) 162/53 (!) 165/66 (!) 158/54  Pulse: 61 61 65 69  Resp: 17 17  18   Temp: (!) 97.5 F (36.4 C) 98 F (36.7 C)  98.2 F (36.8 C)  TempSrc: Oral   Oral  SpO2: 99% 100%  100%  Weight:      Height:       Supplemental O2: Room Air SpO2: 100 % O2 Flow Rate (L/min): 0 L/min   Physical Exam:  Physical Exam Vitals and nursing note reviewed.  Constitutional:      General: She is not in acute distress. Cardiovascular:     Rate and Rhythm: Normal rate and regular rhythm.     Pulses: Normal pulses.     Heart sounds: Normal heart sounds. No murmur heard.  No friction rub.  Pulmonary:     Effort: Pulmonary  effort is normal. No respiratory distress.  Musculoskeletal:     Right lower leg: Edema present.     Left lower leg: Edema present.     Comments: Erythema and swelling of lower extremities have improved since admission. Bilateral wraps in place  Neurological:     General: No focal deficit present.     Mental Status: She is alert and oriented to person, place, and time. Mental status is at baseline.  Psychiatric:        Mood and Affect: Mood normal.        Behavior: Behavior normal.    Filed Weights   04/27/20 0739  Weight: (!) 170.1 kg    Intake/Output Summary (Last 24 hours) at 05/02/2020 1322 Last data filed at 05/02/2020 1322 Gross per 24 hour  Intake 1147.11 ml  Output --  Net 1147.11 ml   Net IO Since Admission: 5,527.11 mL [05/02/20 1322]  Pertinent Labs: CBC Latest Ref Rng & Units 04/29/2020 04/28/2020 04/27/2020  WBC 4.0 - 10.5 K/uL 5.7 8.9 9.8  Hemoglobin 12.0 - 15.0 g/dL 10.0(L) 10.2(L) 11.5(L)  Hematocrit 36 - 46 % 32.0(L) 33.1(L) 37.4  Platelets 150 - 400 K/uL 312 314 359    CMP Latest Ref Rng & Units 05/02/2020 05/01/2020 04/30/2020  Glucose 70 - 99  mg/dL - - -  BUN 6 - 20 mg/dL - - -  Creatinine 0.44 - 1.00 mg/dL 1.29(H) 1.36(H) 1.49(H)  Sodium 135 - 145 mmol/L - - -  Potassium 3.5 - 5.1 mmol/L - - -  Chloride 98 - 111 mmol/L - - -  CO2 22 - 32 mmol/L - - -  Calcium 8.9 - 10.3 mg/dL - - -  Total Protein 6.5 - 8.1 g/dL - - -  Total Bilirubin 0.3 - 1.2 mg/dL - - -  Alkaline Phos 38 - 126 U/L - - -  AST 15 - 41 U/L - - -  ALT 0 - 44 U/L - - -   Imaging: CT TIBIA FIBULA LEFT W CONTRAST  Result Date: 05/01/2020 CLINICAL DATA:  Pain and swelling in both lower extremities. EXAM: CT OF THE LOWER RIGHT EXTREMITY WITH CONTRAST, CT OF THE RIGHT LOWER EXTREMITY WITH CONTRAST TECHNIQUE: Multidetector CT imaging of bilateral lower extremities was performed according to the standard protocol following intravenous contrast administration. COMPARISON:  None.  CONTRAST:  105mL OMNIPAQUE IOHEXOL 300 MG/ML  SOLN FINDINGS: Diffuse and fairly marked subcutaneous soft tissue swelling/edema/fluid involving both lower extremities, left worse than right. I do not see a discrete rim enhancing fluid collection to suggest a drainable soft tissue abscess. No definite findings for myofasciitis or pyomyositis. No findings suspicious for septic arthritis or osteomyelitis. Moderate degenerative changes noted at the ankles and knees. IMPRESSION: 1. Diffuse and fairly marked subcutaneous soft tissue swelling/edema/fluid involving both lower extremities, left worse than right. Findings consistent with cellulitis. No findings for drainable soft tissue abscess, myofasciitis or pyomyositis. 2. No CT findings suspicious for septic arthritis or osteomyelitis. Electronically Signed   By: Marijo Sanes M.D.   On: 05/01/2020 18:58   CT TIBIA FIBULA RIGHT W CONTRAST  Result Date: 05/01/2020 CLINICAL DATA:  Pain and swelling in both lower extremities. EXAM: CT OF THE LOWER RIGHT EXTREMITY WITH CONTRAST, CT OF THE RIGHT LOWER EXTREMITY WITH CONTRAST TECHNIQUE: Multidetector CT imaging of bilateral lower extremities was performed according to the standard protocol following intravenous contrast administration. COMPARISON:  None. CONTRAST:  11mL OMNIPAQUE IOHEXOL 300 MG/ML  SOLN FINDINGS: Diffuse and fairly marked subcutaneous soft tissue swelling/edema/fluid involving both lower extremities, left worse than right. I do not see a discrete rim enhancing fluid collection to suggest a drainable soft tissue abscess. No definite findings for myofasciitis or pyomyositis. No findings suspicious for septic arthritis or osteomyelitis. Moderate degenerative changes noted at the ankles and knees. IMPRESSION: 1. Diffuse and fairly marked subcutaneous soft tissue swelling/edema/fluid involving both lower extremities, left worse than right. Findings consistent with cellulitis. No findings for drainable soft  tissue abscess, myofasciitis or pyomyositis. 2. No CT findings suspicious for septic arthritis or osteomyelitis. Electronically Signed   By: Marijo Sanes M.D.   On: 05/01/2020 18:58    Assessment/Plan:   Active Problems:   Uncontrolled type 2 diabetes mellitus with complication, with long-term current use of insulin ([place])   Morbid obesity due to excess calories ([place])   Cellulitis   AKI (acute kidney injury) ([place])   Stasis dermatitis   Patient Summary: ODYSSEY VASBINDER a 54 y.o.with a pertinent PMH of uncontrolled type 2 DM, stasis dermatitis who presented to the ED with worsening lower extremity swelling thought to be cellulitis admitted for IV antibiotics after failing outpatient treatment  Bilateral Lower Extremity Cellulitis Bilateral lower extremitystasis dermatitis Lymphedema Lower extremity erythema has improved as well as the swelling. Upon examination patient had wraps  and bandages on lower extremities, will return to patient's room when she no longer has them on to assess lower portion of leg. She endorses left hip pain that has worsened since physical therapy and since she has started to raise her legs. Suspect this will improve with physical therapy. Will continue vancomycin treatment IV while she is admitted.  Bilateral lower extremity CT performed yesterday due to patient's increased pain, negative for abscess or other pathological findings. Appeared to have diffuse subcutaneous soft tissue swelling/edema.   PT recommended SNF placement, social work have sent patient's request to facilities. Pending placement.   -continue vancomycin day5/7. (stop 10/27) -transition to clindamycin at discharge if patient has not completed course of abx inpatient -vancomycin trough of 15, 10/24 -PT recommending SNF -elevate lower extremities -wound care recommended kerlix and ace bandage daily -TOC/Social Work pending SNF placement -continue to follow with wound care center in  [place] -follow up with PCP for podiatry follow up  Uncontrolled Type 2 Diabetes Gastroparesis Diabetic Neuropathy Patient continued on home medications during admission. Followed by endocrinology on outpatient basis. -continue U500 insulin -follow up with endocrinologist and pcp  Acute Kidney Injury Secondary to Dehydration 1.36>1.29, improved. Increase in creatinine since admission. Baseline approximately .9-1.05. Suspect dehydration and vancomycin contribution -encourage PO intake -daily BMP  Diet: Heart Healthy IVF: None,None VTE: Enoxaparin Code: Full PT/OT recs: None, none.  Dispo: Anticipated discharge to Home in 1-2 days pending SNF placement.   Sanjuana Letters DO Internal Medicine Resident PGY-1 Pager [phone] Please contact the on call pager after 5 pm and on weekends at [phone].

## 2020-05-02 NOTE — TOC Initial Note (Signed)
Transition of Care [facility]) - Initial/Assessment Note    Patient Details  Name: Michelle Skinner MRN: [id number] Date of Birth: 1966-04-27  Transition of Care [facility]) CM/SW Contact:    Bethann Berkshire, [place] Phone Number: 05/02/2020, 1:14 PM  Clinical Narrative:                 CSW received consult for possible SNF placement at time of discharge. CSW spoke with patient regarding PT recommendation of SNF placement at time of discharge. Patient reported that patient's spouse is currently unable to care for patient at their home given patient's current physical needs and fall risk. Patient expressed understanding of PT recommendation and is agreeable to SNF placement at time of discharge. Patient reports preference for [facility] area. She has been to [facility] in the past. She would like to explore other facilities but is okay with [facility] if she were to go back. CSW discussed insurance authorization process and provided Medicare SNF ratings list. Patient expressed being hopeful for rehab and to feel better soon. No further questions reported at this time. CSW to continue to follow and assist with discharge planning needs.Fl2 completed and bed requests sent.    Expected Discharge Plan: Skilled Nursing Facility Barriers to Discharge: SNF Pending bed offer   Patient Goals and CMS Choice Patient states their goals for this hospitalization and ongoing recovery are:: SNF in [place] area CMS Medicare.gov Compare Post Acute Care list provided to:: Patient Choice offered to / list presented to : Patient  Expected Discharge Plan and Services Expected Discharge Plan: Pray arrangements for the past 2 months: Single Family Home                                      Prior Living Arrangements/Services Living arrangements for the past 2 months: Single Family Home Lives with:: Spouse Patient language and need for interpreter reviewed:: Yes        Need for Family  Participation in Patient Care: No (Comment) Care giver support system in place?: No (comment)   Criminal Activity/Legal Involvement Pertinent to Current Situation/Hospitalization: No - Comment as needed  Activities of Daily Living Home Assistive Devices/Equipment: Cane (specify quad or straight), Dentures (specify type), Eyeglasses, Walker (specify type) ADL Screening (condition at time of admission) Patient's cognitive ability adequate to safely complete daily activities?: Yes Is the patient deaf or have difficulty hearing?: No Does the patient have difficulty seeing, even when wearing glasses/contacts?: No Does the patient have difficulty concentrating, remembering, or making decisions?: No Patient able to express need for assistance with ADLs?: Yes Does the patient have difficulty dressing or bathing?: No Independently performs ADLs?: Yes (appropriate for developmental age) Does the patient have difficulty walking or climbing stairs?: Yes Weakness of Legs: Both Weakness of Arms/Hands: None  Permission Sought/Granted                  Emotional Assessment Appearance:: Appears stated age Attitude/Demeanor/Rapport: Engaged Affect (typically observed): Accepting, Adaptable, Pleasant Orientation: : Oriented to Place, Oriented to  Time, Oriented to Situation, Oriented to Self Alcohol / Substance Use: Not Applicable Psych Involvement: No (comment)  Admission diagnosis:  Cellulitis [L03.90] Cellulitis of left lower extremity [L03.116] Cellulitis of right lower extremity [L03.115] Poorly controlled type 2 diabetes mellitus ([place]) [E11.65] Lymphedema of both lower extremities [I89.0] Patient Active Problem List   Diagnosis  Date Noted  . Cellulitis 04/27/2020  . AKI (acute kidney injury) ([place]) 04/27/2020  . Stasis dermatitis 04/27/2020  . Anxiety 03/31/2020  . Depression 03/31/2020  . GERD (gastroesophageal reflux disease) 03/31/2020  . Posttraumatic stress disorder 03/31/2020   . Personal history of noncompliance with medical treatment, presenting hazards to health 02/09/2018  . Abnormal CT of the abdomen 10/20/2017  . Dilated cbd, acquired 10/20/2017  . Essential hypertension, benign 01/14/2017  . Primary hypothyroidism 05/03/2015  . Mixed hyperlipidemia 05/03/2015  . Uncontrolled type 2 diabetes mellitus with complication, with long-term current use of insulin ([place]) 04/25/2015  . Morbid obesity due to excess calories ([place]) 04/25/2015  . Vitamin D deficiency 04/25/2015  . Cervical cancer ([place]) 10/05/2012   PCP:  Glenda Chroman, MD Pharmacy:   [place], [place] [address] Phone: [phone] Fax: [place], [address], [address] [place], [place] [address] Phone: [phone] Fax: [phone]  [facility] [place], [address] [address] Phone: [phone] Fax: [phone]     Social Determinants of Health (SDOH) Interventions    Readmission Risk Interventions No flowsheet data found.

## 2020-05-02 NOTE — Progress Notes (Signed)
Physical Therapy Treatment Patient Details Name: Michelle POMPLUN MRN: [id number] DOB: 07-04-1966 Today's Date: 05/02/2020    History of Present Illness 54 yo female with onset of worsening lymphedema, weeping skin on LE's, wounds with stasis dermatitis, and AKI was admitted for cellulitis of LE's.  PMHx:  DM, PN, Charcot feet, lymphedema, cervical CA, HTN, hypothyroidism, gastroparesis    PT Comments    Pt supine on arrival, agreeable to therapy session with minimal encouragement. Pt session focus on progressing gait/standing tolerance. She progressed gait distance to 81ft prior to needing standing rest break >1 minute with RW and min guard assist. Pt needs minA for transfers/bed mobility and cues for safety with transfers. Pt agreeable to remain up in chair with BLE elevated for edema reduction at end of session. Pt continues to benefit from PT services to progress toward functional mobility goals. D/C recs below remain appropriate.   Follow Up Recommendations  SNF     Equipment Recommendations  None recommended by PT    Recommendations for Other Services       Precautions / Restrictions Precautions Precautions: Fall Precaution Comments: monitor LE's for wounds Restrictions Weight Bearing Restrictions: No    Mobility  Bed Mobility Overal bed mobility: Needs Assistance Bed Mobility: Supine to Sit     Supine to sit: Min assist     General bed mobility comments: use of bed features  Transfers Overall transfer level: Needs assistance Equipment used: Rolling walker (2 wheeled) Transfers: Sit to/from Stand Sit to Stand: Min assist         General transfer comment: cues for safe UE placement, RW stabilized by staff member  Ambulation/Gait Ambulation/Gait assistance: Min guard Gait Distance (Feet): 60 Feet (75ft x2 with 2-3 minute standing rest break between trials) Assistive device: Rolling walker (2 wheeled) Gait Pattern/deviations: Step-through pattern;Decreased  dorsiflexion - right;Decreased stride length;Decreased dorsiflexion - left;Wide base of support;Decreased step length - left (decreased B hip flexion) Gait velocity: reduced   General Gait Details: altered gait over edema, limited ankle DF and reduction of balance   Stairs             Wheelchair Mobility    Modified Rankin (Stroke Patients Only)       Balance Overall balance assessment: Needs assistance Sitting-balance support: Feet supported Sitting balance-Leahy Scale: Fair Sitting balance - Comments: weight shifting with no LOB   Standing balance support: Bilateral upper extremity supported;During functional activity Standing balance-Leahy Scale: Poor                              Cognition Arousal/Alertness: Awake/alert Behavior During Therapy: WFL for tasks assessed/performed Overall Cognitive Status: Within Functional Limits for tasks assessed                                 General Comments: some decreased judgement/problem solving but good participation and motivated to progress mobility      Exercises      General Comments General comments (skin integrity, edema, etc.): dressings over BLE distal from knees to ankles, student RN notified pt needs tape fixed at IV site at end of session as it was starting to pull away      Pertinent Vitals/Pain Pain Assessment: Faces Faces Pain Scale: Hurts little more Pain Location: BLE wounds Pain Descriptors / Indicators: Tender;Sharp Pain Intervention(s): Monitored during session;Premedicated before session;Repositioned (pt defers ice, reports cold  allergy (gets blisters from ice))   Vitals:   05/02/20 1306  BP: (!) 158/54  Pulse: 69  Resp: 18  Temp: 98.2 F (36.8 C)  SpO2: 100%  SpO2 97% during mobility and HR 80's bpm.  Home Living                      Prior Function            PT Goals (current goals can now be found in the care plan section) Acute Rehab PT  Goals Patient Stated Goal: go to rehab to get stronger then get home and get edema managed PT Goal Formulation: With patient Time For Goal Achievement: 05/15/20 Potential to Achieve Goals: Good    Frequency    Min 3X/week      PT Plan Current plan remains appropriate    Co-evaluation              AM-PAC PT "6 Clicks" Mobility   Outcome Measure  Help needed turning from your back to your side while in a flat bed without using bedrails?: A Little Help needed moving from lying on your back to sitting on the side of a flat bed without using bedrails?: A Little Help needed moving to and from a bed to a chair (including a wheelchair)?: A Little Help needed standing up from a chair using your arms (e.g., wheelchair or bedside chair)?: A Little Help needed to walk in hospital room?: A Little Help needed climbing 3-5 steps with a railing? : Total 6 Click Score: 16    End of Session Equipment Utilized During Treatment: Gait belt Activity Tolerance: Patient tolerated treatment well Patient left: in chair;with call bell/phone within reach (pt instructed on safety and use of call bell) Nurse Communication: Mobility status PT Visit Diagnosis: Unsteadiness on feet (R26.81);Muscle weakness (generalized) (M62.81);Difficulty in walking, not elsewhere classified (R26.2);Pain Pain - Right/Left: Left Pain - part of body: Hip     Time: 1436-1500 PT Time Calculation (min) (ACUTE ONLY): 24 min  Charges:  $Gait Training: 8-22 mins $Therapeutic Activity: 8-22 mins                     Adaora Mchaney P., PTA Acute Rehabilitation Services Pager: [phone] Office: [place] 05/02/2020, 4:22 PM

## 2020-05-03 LAB — GLUCOSE, CAPILLARY
Glucose-Capillary: 232 mg/dL — ABNORMAL HIGH (ref 70–99)
Glucose-Capillary: 202 mg/dL — ABNORMAL HIGH (ref 70–99)
Glucose-Capillary: 74 mg/dL (ref 70–99)
Glucose-Capillary: 100 mg/dL — ABNORMAL HIGH (ref 70–99)

## 2020-05-03 LAB — CREATININE, SERUM
Creatinine, Ser: 1.32 mg/dL — ABNORMAL HIGH (ref 0.44–1.00)
GFR, Estimated: 49 mL/min — ABNORMAL LOW (ref >60)

## 2020-05-03 MED ORDER — HYDRALAZINE HCL 25 MG PO TABS
25.0000 mg | ORAL_TABLET | Freq: Two times a day (BID) | ORAL | Status: DC
  Administered 2020-05-03 – 2020-05-04 (×3): 25 mg via ORAL
  Filled 2020-05-03 (×3): qty 1

## 2020-05-03 NOTE — TOC Progression Note (Addendum)
Transition of Care [facility]) - Progression Note    Patient Details  Name: Michelle Skinner MRN: [id number] Date of Birth: 1965-09-07  Transition of Care [facility]) CM/SW [place], [place] Phone Number: 05/03/2020, 11:12 AM  Clinical Narrative:     CSW met with pt to discuss offers. Pt prefers [facility] but will go to [facility] if only facility in [place] area to accept.   CSW called [facility]. Admissions agrees to review pt and return call to [place].   CSW started [place]. Ref # [id number]  [id number]: Auth received. Approved from 10/27 -10/29  Expected Discharge Plan: [place] Barriers to Discharge: SNF Pending bed offer  Expected Discharge Plan and Services Expected Discharge Plan: [place] arrangements for the past 2 months: Single Family Home                                       Social Determinants of Health (SDOH) Interventions    Readmission Risk Interventions No flowsheet data found.

## 2020-05-03 NOTE — Discharge Summary (Signed)
Name: Michelle Skinner MRN: [id number] DOB: 1965/08/05 54 y.o. PCP: Glenda Chroman, MD  Date of Admission: 04/27/2020  7:36 AM Date of Discharge:  Attending Physician: Dr. Daryll Drown  Discharge Diagnosis: Active Problems:   Uncontrolled type 2 diabetes mellitus with complication, with long-term current use of insulin ([place])   Morbid obesity due to excess calories ([place])   Cellulitis   AKI (acute kidney injury) ([place])   Stasis dermatitis   Discharge Medications: Allergies as of 05/04/2020      Reactions   Sulfa Antibiotics Rash   Bactrim [sulfamethoxazole-trimethoprim] Itching   Cherry Other (See Comments)   Unknown   Gabapentin Other (See Comments)   Unknown   Invokana [canagliflozin] Other (See Comments)   Unknown   Other    Hot peppers      Medication List    STOP taking these medications   lisinopril 20 MG tablet Commonly known as: ZESTRIL     TAKE these medications   acetaminophen 500 MG tablet Commonly known as: TYLENOL Take 1,000 mg by mouth every 6 (six) hours as needed for mild pain, fever or headache.   atorvastatin 20 MG tablet Commonly known as: LIPITOR TAKE 1 TABLET BY MOUTH  DAILY AT 6 PM. What changed:   how much to take  how to take this  when to take this   carvedilol 25 MG tablet Commonly known as: COREG Take 1 tablet (25 mg total) by mouth 2 (two) times daily with a meal.   DIUREX PO Take 200 mg by mouth daily.   doxycycline 100 MG tablet Commonly known as: ADOXA Take 100 mg by mouth 2 (two) times daily. 10 day supply   FLUoxetine 40 MG capsule Commonly known as: PROZAC Take 40 mg by mouth daily.   FreeStyle Libre 14 Day Reader Kerrin Mo by Does not apply route 4 (four) times daily.   FreeStyle Libre 2 Reader Kerrin Mo As directed   [employer] 2 Sensor Misc 1 Piece by Does not apply route every 14 (fourteen) days.   HumuLIN R U-500 KwikPen 500 UNIT/ML kwikpen Generic drug: insulin regular human CONCENTRATED Inject 40-50 Units into  the skin 3 (three) times daily with meals.   hydrALAZINE 25 MG tablet Commonly known as: APRESOLINE Take 1 tablet (25 mg total) by mouth 2 (two) times daily.   levothyroxine 137 MCG tablet Commonly known as: SYNTHROID TAKE ONE (1) TABLET EACH DAY What changed:   how much to take  how to take this  when to take this   metoprolol succinate 100 MG 24 hr tablet Commonly known as: TOPROL-XL Take 100 mg by mouth daily.   multivitamin with minerals Tabs tablet Take 1 tablet by mouth daily.   neomycin-bacitracin-polymyxin ointment Commonly known as: NEOSPORIN Apply 1 application topically every 12 (twelve) hours. To legs   omeprazole 20 MG capsule Commonly known as: PRILOSEC Take 1 capsule (20 mg total) by mouth 2 (two) times daily before a meal. For 10 days while on Pylera for H.pylori   pantoprazole 40 MG tablet Commonly known as: PROTONIX TAKE ONE (1) TABLET EACH DAY What changed: See the new instructions.   VITAMIN A PO Take 1 tablet by mouth daily.   VITAMIN B-1 PO Take 1 tablet by mouth daily.   VITAMIN C PO Take 1 tablet by mouth daily.   Vitamin D (Ergocalciferol) 1.25 MG (50000 UNIT) Caps capsule Commonly known as: DRISDOL Take 1 capsule (50,000 Units total) by mouth every 7 (seven) days.  Vitamin D3 125 MCG (5000 UT) Caps Take 1 capsule (5,000 Units total) by mouth daily.   VITAMIN E PO Take 1 tablet by mouth daily.            Discharge Care Instructions  (From admission, onward)         Start     Ordered   05/04/20 0000  Leave dressing on - Keep it clean, dry, and intact until clinic visit        05/04/20 0925          Disposition and follow-up:   [name] was discharged from [facility] in Stable condition.  At the hospital follow up visit please address:  1.  Follow-up:  A. Lower Extremity Cellulitis - finished course of vancomycin    B. Stasis Dermatitis/Lymphadema - improvement of swelling with                  elevation. Unable to wrap due to                                                                                pain/blisters forming. ABI's unable to                 be performed    C. Acute Kidney Injury - Suspect due to dehydration/vancomycin,                   encouraged PO fluids   D. Hypertension - Held lisinopril, started hydralazine in light of AKI   E. Type II Diabetes Mellitus - continued on 500 U insulin  2.  Labs / imaging needed at follow-up: Creatinine, A1c  3.  Pending labs/ test needing follow-up: None  4.  Medication Changes  Started: hydralazine 25 mg BID BP  Stopped: Lisinopril - In setting of AKI  Abx - None at discharge  Follow-up Appointments: PCP - [facility] Course by problem list:  1.) Bilateral Lower Extremity Cellulitis Patient sent to the ED after recurrent infection after 2 courses of antibiotics (2 weeks keflex, 2 weeks doxy). Patient denied any signs/symptoms of systemic infection prior to or during her admission aside from a few episodes of chills. On examination she was found to have bilateral skin changes with erythema, swelling, and drainage. Patient endorsed foul smelling odor and thick drainage different from usual drainage that occurs. She was started on a 7 day course of vancomycin IV and instructed to elevate her legs as much as possible. ABI's, Unaboots or other compression wraps unable to be applied during admission as patient was in considerable pain as well as had recurrent blister formation from stasis dermatitis. Due to the extent of the pain a bilateral lower extremity CT was performed that was negative for any abscess. The patient progressed well with improvement of the swelling, erythema, and pain. Pain well controlled with ibuprofen/tylenol. She worked with physical therapy and will continue on an outpatient basis as well as follow up with wound care center.   2.) Acute Kidney Injury Patient found to have AKI,  prerenal in nature. Suspected etiology during admission was dehydration and vancomycin use. Nephrotoxic medication was  held and patient was started on IV fluids. Encouraged PO intake during admission and at discharge.  3.) Hypertension During hospital course patient found to be hypertensive 426'S systolically. She was started on her home coreg and hydralazine 25 mg BID was added as well. Patient's home lisinopril was held in the setting of her AKI. Plan to discharge home with continued coreg use, start 25 mg hydralazine BID, and hold on lisinopril until she follows up with PCP  4.) Type II Diabetes Mellitus Patient continued on 500 U insulin during hospitalization.   Discharge Vitals:   BP (!) 134/55 (BP Location: Right Wrist)   Pulse 66   Temp 98.2 F (36.8 C) (Oral)   Resp 17   Ht 5' 7.5" (1.715 m)   Wt (!) 170.1 kg   SpO2 100%   BMI 57.87 kg/m   Pertinent Labs, Studies, and Procedures:  CBC Latest Ref Rng & Units 04/29/2020 04/28/2020 04/27/2020  WBC 4.0 - 10.5 K/uL 5.7 8.9 9.8  Hemoglobin 12.0 - 15.0 g/dL 10.0(L) 10.2(L) 11.5(L)  Hematocrit 36 - 46 % 32.0(L) 33.1(L) 37.4  Platelets 150 - 400 K/uL 312 314 359    CMP Latest Ref Rng & Units 05/04/2020 05/03/2020 05/02/2020  Glucose 70 - 99 mg/dL - - -  BUN 6 - 20 mg/dL - - -  Creatinine 0.44 - 1.00 mg/dL 1.37(H) 1.32(H) 1.29(H)  Sodium 135 - 145 mmol/L - - -  Potassium 3.5 - 5.1 mmol/L - - -  Chloride 98 - 111 mmol/L - - -  CO2 22 - 32 mmol/L - - -  Calcium 8.9 - 10.3 mg/dL - - -  Total Protein 6.5 - 8.1 g/dL - - -  Total Bilirubin 0.3 - 1.2 mg/dL - - -  Alkaline Phos 38 - 126 U/L - - -  AST 15 - 41 U/L - - -  ALT 0 - 44 U/L - - -    Discharge Instructions: Discharge Instructions    Call MD for:  difficulty breathing, headache or visual disturbances   Complete by: As directed    Call MD for:  persistant dizziness or light-headedness   Complete by: As directed    Call MD for:  persistant nausea and vomiting    Complete by: As directed    Call MD for:  redness, tenderness, or signs of infection (pain, swelling, redness, odor or green/yellow discharge around incision site)   Complete by: As directed    Call MD for:  severe uncontrolled pain   Complete by: As directed    Call MD for:  temperature >100.4   Complete by: As directed    Diet - low sodium heart healthy   Complete by: As directed    Increase activity slowly   Complete by: As directed    Leave dressing on - Keep it clean, dry, and intact until clinic visit   Complete by: As directed      Signed: Riesa Pope, MD 05/04/2020, 10:15 AM   Pager: [phone]

## 2020-05-03 NOTE — Progress Notes (Addendum)
HD#6 Subjective:   No acute events overnight.   Patient reports Michelle Skinner slept well for the first night. Was given Tylenol and ibuprofen at the same time. Reports Michelle Skinner worked with physical therapy yesterday and had improved mobility.   Discussed that today is the last day of antibiotics and we are working towards skilled nursing placement for subacute rehab as well as establishing with wound care center. Patient expresses concern that things will get worse, notes Michelle Skinner is trying to stay positive. Patient states, "I've got 6 grandbabies I got to live for."  Explained that we will restart home amlodipine.  Objective:  Vital signs in last 24 hours: Vitals:   05/02/20 0745 05/02/20 1306 05/02/20 2056 05/03/20 0507  BP: (!) 165/66 (!) 158/54 (!) 167/67 (!) 165/62  Pulse: 65 69 (!) 53 (!) 59  Resp:  18 18 16   Temp:  98.2 F (36.8 C) 97.7 F (36.5 C) 97.9 F (36.6 C)  TempSrc:  Oral Oral Oral  SpO2:  100% 100% 100%  Weight:      Height:       Supplemental O2: Room Air SpO2: 100 % O2 Flow Rate (L/min): 0 L/min   Physical Exam:  Physical Exam Vitals and nursing note reviewed.  Constitutional:      General: Michelle Skinner is not in acute distress. Cardiovascular:     Rate and Rhythm: Normal rate and regular rhythm.     Pulses: Normal pulses.     Heart sounds: Normal heart sounds. No murmur heard.  No friction rub.  Pulmonary:     Effort: Pulmonary effort is normal. No respiratory distress.  Musculoskeletal:     Right lower leg: Edema present. Improved    Left lower leg: Edema present. Improved    Comments: Erythema and swelling of lower extremities have improved since admission. Bilateral wraps in place  Neurological:     General: No focal deficit present.     Mental Status: Michelle Skinner is alert and oriented to person, place, and time. Mental status is at baseline.  Psychiatric:        Mood and Affect: Mood normal.        Behavior: Behavior normal.   Filed Weights   04/27/20 0739  Weight:  (!) 170.1 kg     Intake/Output Summary (Last 24 hours) at 05/03/2020 0753 Last data filed at 05/02/2020 1658 Gross per 24 hour  Intake 720 ml  Output --  Net 720 ml   Net IO Since Admission: 5,767.11 mL [05/03/20 0753]  Pertinent Labs: CBC Latest Ref Rng & Units 04/29/2020 04/28/2020 04/27/2020  WBC 4.0 - 10.5 K/uL 5.7 8.9 9.8  Hemoglobin 12.0 - 15.0 g/dL 10.0(L) 10.2(L) 11.5(L)  Hematocrit 36 - 46 % 32.0(L) 33.1(L) 37.4  Platelets 150 - 400 K/uL 312 314 359    CMP Latest Ref Rng & Units 05/03/2020 05/02/2020 05/01/2020  Glucose 70 - 99 mg/dL - - -  BUN 6 - 20 mg/dL - - -  Creatinine 0.44 - 1.00 mg/dL 1.32(H) 1.29(H) 1.36(H)  Sodium 135 - 145 mmol/L - - -  Potassium 3.5 - 5.1 mmol/L - - -  Chloride 98 - 111 mmol/L - - -  CO2 22 - 32 mmol/L - - -  Calcium 8.9 - 10.3 mg/dL - - -  Total Protein 6.5 - 8.1 g/dL - - -  Total Bilirubin 0.3 - 1.2 mg/dL - - -  Alkaline Phos 38 - 126 U/L - - -  AST 15 - 41 U/L - - -  ALT 0 - 44 U/L - - -    Imaging: No results found.  Assessment/Plan:   Active Problems:   Uncontrolled type 2 diabetes mellitus with complication, with long-term current use of insulin ([facility])   Morbid obesity due to excess calories ([place])   Cellulitis   AKI (acute kidney injury) ([place])   Stasis dermatitis  Patient Summary: Michelle Skinner a 54 y.o.with a pertinent PMH of uncontrolled type 2 DM, stasis dermatitis who presented to the ED with worsening lower extremity swelling thought to be cellulitis admitted for IV antibiotics after failing outpatient treatment  Bilateral Lower Extremity Cellulitis Bilateral lower extremitystasis dermatitis Lymphedema Lower extremity swelling, erythema, and pain has improved. Pictures uploaded onto media file. Continues to endorse left hip pain that improves with ibuprofen and tylenol, suspect as patient continues to work with physical therapy this will improve. Stopping vancomycin tomorrow and patient has been accepted for  SNF, plan to discharge tomorrow.   -continue vancomycin day7/7. (stop 10/27) -vancomycin trough of 15, 10/24 -PT recommending SNF -elevate lower extremities -wound care recommended kerlix and ace bandage daily -continue to follow with wound care center in [place] -follow up with PCP for podiatry follow up  UncontrolledType 2Diabetes Gastroparesis Diabetic Neuropathy Patient continued on home medications during admission. Followed by endocrinology on outpatient basis. -continue U500 insulin -follow up with endocrinologist and pcp  Acute Kidney InjurySecondary to Dehydration 1.29>1.32, improved.Increase in creatinine since admission. Baseline approximately .9-1.05.Suspect dehydration and vancomycin contribution -holding fluids at this time in context of hypertension -encourage PO intake -daily BMP -avoid nephrotoxic medications  Hypertension Systolic blood pressure has increased on admission, 165/62. Held home lisinopril in context of AKI. Considered calcium channel blocker, however, patient with lower extremity edema, would not want this to worsen during hospitalization.  -Will continue to monitor at this time, patient asymptomatic -continue coreg, consider additional medications if blood pressure continues to rise or patient becomes symptomatic.   Diet: Heart Healthy IVF: None,None VTE: Enoxaparin Code: Full PT/OT recs: None, none.  Dispo: Anticipated discharge to [facility] Resident PGY-1 Pager [phone] Please contact the on call pager after 5 pm and on weekends at [phone].

## 2020-05-04 DIAGNOSIS — M6281 Muscle weakness (generalized): Secondary | ICD-10-CM | POA: Diagnosis not present

## 2020-05-04 DIAGNOSIS — I890 Lymphedema, not elsewhere classified: Secondary | ICD-10-CM | POA: Diagnosis not present

## 2020-05-04 DIAGNOSIS — L03115 Cellulitis of right lower limb: Secondary | ICD-10-CM | POA: Diagnosis not present

## 2020-05-04 DIAGNOSIS — S81802A Unspecified open wound, left lower leg, initial encounter: Secondary | ICD-10-CM | POA: Diagnosis not present

## 2020-05-04 DIAGNOSIS — L03116 Cellulitis of left lower limb: Secondary | ICD-10-CM | POA: Diagnosis not present

## 2020-05-04 DIAGNOSIS — L0390 Cellulitis, unspecified: Secondary | ICD-10-CM | POA: Diagnosis not present

## 2020-05-04 DIAGNOSIS — Z743 Need for continuous supervision: Secondary | ICD-10-CM | POA: Diagnosis not present

## 2020-05-04 DIAGNOSIS — R6889 Other general symptoms and signs: Secondary | ICD-10-CM | POA: Diagnosis not present

## 2020-05-04 DIAGNOSIS — Z794 Long term (current) use of insulin: Secondary | ICD-10-CM | POA: Diagnosis not present

## 2020-05-04 DIAGNOSIS — S81801A Unspecified open wound, right lower leg, initial encounter: Secondary | ICD-10-CM | POA: Diagnosis not present

## 2020-05-04 DIAGNOSIS — M2550 Pain in unspecified joint: Secondary | ICD-10-CM | POA: Diagnosis not present

## 2020-05-04 DIAGNOSIS — L03119 Cellulitis of unspecified part of limb: Secondary | ICD-10-CM | POA: Diagnosis not present

## 2020-05-04 DIAGNOSIS — E039 Hypothyroidism, unspecified: Secondary | ICD-10-CM | POA: Diagnosis not present

## 2020-05-04 DIAGNOSIS — E119 Type 2 diabetes mellitus without complications: Secondary | ICD-10-CM | POA: Diagnosis not present

## 2020-05-04 DIAGNOSIS — E118 Type 2 diabetes mellitus with unspecified complications: Secondary | ICD-10-CM | POA: Diagnosis not present

## 2020-05-04 DIAGNOSIS — R2681 Unsteadiness on feet: Secondary | ICD-10-CM | POA: Diagnosis not present

## 2020-05-04 DIAGNOSIS — E559 Vitamin D deficiency, unspecified: Secondary | ICD-10-CM | POA: Diagnosis not present

## 2020-05-04 DIAGNOSIS — E1143 Type 2 diabetes mellitus with diabetic autonomic (poly)neuropathy: Secondary | ICD-10-CM | POA: Diagnosis not present

## 2020-05-04 DIAGNOSIS — Z7401 Bed confinement status: Secondary | ICD-10-CM | POA: Diagnosis not present

## 2020-05-04 DIAGNOSIS — E11628 Type 2 diabetes mellitus with other skin complications: Secondary | ICD-10-CM | POA: Diagnosis not present

## 2020-05-04 DIAGNOSIS — R262 Difficulty in walking, not elsewhere classified: Secondary | ICD-10-CM | POA: Diagnosis not present

## 2020-05-04 DIAGNOSIS — M79662 Pain in left lower leg: Secondary | ICD-10-CM | POA: Diagnosis not present

## 2020-05-04 DIAGNOSIS — I10 Essential (primary) hypertension: Secondary | ICD-10-CM | POA: Diagnosis not present

## 2020-05-04 DIAGNOSIS — Z23 Encounter for immunization: Secondary | ICD-10-CM | POA: Diagnosis not present

## 2020-05-04 LAB — CREATININE, SERUM
Creatinine, Ser: 1.37 mg/dL — ABNORMAL HIGH (ref 0.44–1.00)
GFR, Estimated: 46 mL/min — ABNORMAL LOW (ref >60)

## 2020-05-04 LAB — GLUCOSE, CAPILLARY
Glucose-Capillary: 137 mg/dL — ABNORMAL HIGH (ref 70–99)
Glucose-Capillary: 301 mg/dL — ABNORMAL HIGH (ref 70–99)

## 2020-05-04 MED ORDER — HYDRALAZINE HCL 25 MG PO TABS: 25.0000 mg | ORAL_TABLET | Freq: Two times a day (BID) | ORAL | 0 refills | Status: DC

## 2020-05-04 NOTE — Progress Notes (Signed)
PT Cancellation Note  Patient Details Name: Michelle Skinner MRN: [id number] DOB: 10-12-1965   Cancelled Treatment:    Reason Eval/Treat Not Completed: Patient declined, no reason specified. Pt in bed with bags packed, awaiting d/c to SNF. Declining treatment this afternoon as she would "rather wait to see what they have planned for me there".    Allena Katz 05/04/2020, 1:16 PM

## 2020-05-04 NOTE — Progress Notes (Signed)
HD#7 Subjective:  Overnight Events: None   Patient reports she is having lower extremity pain but is "dealing with it." Tylenol and ibuprofen effective at controlling the pain. Discussed that we would be holding some of her medications. Patient states she prefers the SNF in [place] [facility]) over [facility] however would go to [facility] if no other bed available.   Objective:  Vital signs in last 24 hours: Vitals:   05/03/20 0507 05/03/20 1307 05/03/20 2104 05/04/20 0455  BP: (!) 165/62 (!) 138/53 (!) 144/56 (!) 134/55  Pulse: (!) 59 66 64 66  Resp: 16 20 16 17   Temp: 97.9 F (36.6 C) 98.5 F (36.9 C) 98.3 F (36.8 C) 98.2 F (36.8 C)  TempSrc: Oral Oral Oral Oral  SpO2: 100% 97% 99% 100%  Weight:      Height:       Supplemental O2: Room Air SpO2: 100 % O2 Flow Rate (L/min): 0 L/min  Physical Exam:  Physical Exam Constitutional:      General: She is not in acute distress.    Appearance: She is not ill-appearing, toxic-appearing or diaphoretic.  HENT:     Head: Normocephalic and atraumatic.  Cardiovascular:     Rate and Rhythm: Normal rate and regular rhythm.     Pulses: Normal pulses.     Heart sounds: No murmur heard.  No friction rub. No gallop.   Pulmonary:     Effort: Pulmonary effort is normal. No respiratory distress.     Breath sounds: Normal breath sounds.  Musculoskeletal:     Right lower leg: Edema present.     Left lower leg: Edema present.     Comments: Bilateral wraps in place  Neurological:     General: No focal deficit present.     Mental Status: She is alert and oriented to person, place, and time. Mental status is at baseline.  Psychiatric:        Mood and Affect: Mood normal.        Behavior: Behavior normal.    Filed Weights   04/27/20 0739  Weight: (!) 170.1 kg    Intake/Output Summary (Last 24 hours) at 05/04/2020 0635 Last data filed at 05/03/2020 1223 Gross per 24 hour  Intake 1179.5 ml  Output --  Net 1179.5 ml   Net  IO Since Admission: 6,946.61 mL [05/04/20 0635]  Pertinent Labs: CBC Latest Ref Rng & Units 04/29/2020 04/28/2020 04/27/2020  WBC 4.0 - 10.5 K/uL 5.7 8.9 9.8  Hemoglobin 12.0 - 15.0 g/dL 10.0(L) 10.2(L) 11.5(L)  Hematocrit 36 - 46 % 32.0(L) 33.1(L) 37.4  Platelets 150 - 400 K/uL 312 314 359    CMP Latest Ref Rng & Units 05/04/2020 05/03/2020 05/02/2020  Glucose 70 - 99 mg/dL - - -  BUN 6 - 20 mg/dL - - -  Creatinine 0.44 - 1.00 mg/dL 1.37(H) 1.32(H) 1.29(H)  Sodium 135 - 145 mmol/L - - -  Potassium 3.5 - 5.1 mmol/L - - -  Chloride 98 - 111 mmol/L - - -  CO2 22 - 32 mmol/L - - -  Calcium 8.9 - 10.3 mg/dL - - -  Total Protein 6.5 - 8.1 g/dL - - -  Total Bilirubin 0.3 - 1.2 mg/dL - - -  Alkaline Phos 38 - 126 U/L - - -  AST 15 - 41 U/L - - -  ALT 0 - 44 U/L - - -   Imaging: No results found.  Assessment/Plan:   Active Problems:  Uncontrolled type 2 diabetes mellitus with complication, with long-term current use of insulin ([place])   Morbid obesity due to excess calories ([place])   Cellulitis   AKI (acute kidney injury) ([place])   Stasis dermatitis   Patient Summary: Michelle Skinner a 54 y.o.with a pertinent PMH of uncontrolled type 2 DM, stasis dermatitis who presented to the ED with worsening lower extremity swelling thought to be cellulitis admitted for IV antibiotics after failing outpatient treatment  Bilateral Lower Extremity Cellulitis Bilateral lower extremitystasis dermatitis Lymphedema Lower extremity swelling, erythema, and pain has improved. Continues to endorse left hip pain that improves with ibuprofen and tylenol, suspect as patient continues to work with physical therapy this will improve. Discharge to SNF today. -course of vancomycin complete -elevate lower extremities -continue to follow with wound care center in [place], consider chronic antibiotic therapy -follow up with PCP for podiatry follow up  UncontrolledType  2Diabetes Gastroparesis Diabetic Neuropathy Patient continued on home medications during admission. Followed by endocrinology on outpatient basis. -continue U500 insulin -follow up with endocrinologist and pcp  Acute Kidney InjurySecondary to Dehydration 1.32>1.37, improved.Increase in creatinine since admission. Baseline approximately .9-1.05.Suspect dehydration and vancomycin contribution -encourage PO intake -daily BMP -avoid nephrotoxic medications, discontinue lisinopril  Hypertension Systolic blood pressure has increased on admission, 165/62. Held home lisinopril in context of AKI. Started on hydralazine 25 mg BID. BP improved to 134/55 -Will continue to monitor at this time, patient asymptomatic -continue coreg, hydralazine at discharge.   Diet:Heart Healthy WPV:XYIA,XKPV VZS:[id number] Code:Full PT/OT recs:None,none.  Dispo: Anticipated discharge to Skilled nursing facility today [facility] Resident PGY-1 Pager [phone] Please contact the on call pager after 5 pm and on weekends at [phone].

## 2020-05-04 NOTE — Progress Notes (Signed)
Michelle Skinner to be D/C'd per MD order. Discussed with the patient and all questions fully answered.  IV catheter discontinued intact. Site without signs and symptoms of complications. Dressing and pressure applied.  An After Visit Summary was printed and given to patient transport.  Patient instructed to return to ED, call 911, or call MD for any changes in condition.  Patient to be escorted via stretcher nd D/C to [facility] via private PT[facility].

## 2020-05-04 NOTE — TOC Transition Note (Signed)
Transition of Care [facility]) - CM/SW Discharge Note   Patient Details  Name: Michelle Skinner MRN: [id number] Date of Birth: Mar 28, 1966  [facility] CM/SW Contact:  Bethann Berkshire, [place] Phone Number: 05/04/2020, 1:11 PM   Clinical Narrative:    Patient will DC to: [facility] Anticipated DC date: 05/04/20 Family notified: Pt will notify family Transport by: Corey Harold   Per MD patient ready for DC to [facility] RN, patient, and facility notified of DC. Discharge Summary and FL2 sent to facility. RN to call report prior to discharge ( Room A19-1 [phone]). DC packet on chart. Ambulance transport requested for patient.   CSW will sign off for now as social work intervention is no longer needed. Please consult [place] again if new needs arise.   Final next level of care: Skilled Nursing Facility Barriers to Discharge: No Barriers Identified   Patient Goals and CMS Choice Patient states their goals for this hospitalization and ongoing recovery are:: SNF in [place] area CMS Medicare.gov Compare Post Acute Care list provided to:: Patient Choice offered to / list presented to : Patient  Discharge Placement                Patient to be transferred to facility by: ptar      Discharge Plan and Services                                     Social Determinants of Health (SDOH) Interventions     Readmission Risk Interventions No flowsheet data found.

## 2020-05-04 NOTE — Discharge Instructions (Signed)
Michelle Skinner it was a pleasure taking care of you during your admission. You were admitted for cellulitis of your lower extremities, an infection of the skin. We treated you with 7 days of antibiotic therapy via your IV. You are being discharged a skilled nursing facility for rehabilitation. Please follow up with Dr. Woody Seller and the wound care center after you are discharged.   Cellulitis, Adult  Cellulitis is a skin infection. The infected area is often warm, red, swollen, and sore. It occurs most often in the arms and lower legs. It is very important to get treated for this condition. What are the causes? This condition is caused by bacteria. The bacteria enter through a break in the skin, such as a cut, burn, insect bite, open sore, or crack. What increases the risk? This condition is more likely to occur in people who:  Have a weak body defense system (immune system).  Have open cuts, burns, bites, or scrapes on the skin.  Are older than 53 years of age.  Have a blood sugar problem (diabetes).  Have a long-lasting (chronic) liver disease (cirrhosis) or kidney disease.  Are very overweight (obese).  Have a skin problem, such as: ? Itchy rash (eczema). ? Slow movement of blood in the veins (venous stasis). ? Fluid buildup below the skin (edema).  Have been treated with high-energy rays (radiation).  Use IV drugs. What are the signs or symptoms? Symptoms of this condition include:  Skin that is: ? Red. ? Streaking. ? Spotting. ? Swollen. ? Sore or painful when you touch it. ? Warm.  A fever.  Chills.  Blisters. How is this diagnosed? This condition is diagnosed based on:  Medical history.  Physical exam.  Blood tests.  Imaging tests. How is this treated? Treatment for this condition may include:  Medicines to treat infections or allergies.  Home care, such as: ? Rest. ? Placing cold or warm cloths (compresses) on the skin.  Hospital care, if the condition  is very bad. Follow these instructions at home: Medicines  Take over-the-counter and prescription medicines only as told by your doctor.  If you were prescribed an antibiotic medicine, take it as told by your doctor. Do not stop taking it even if you start to feel better. General instructions   Drink enough fluid to keep your pee (urine) pale yellow.  Do not touch or rub the infected area.  Raise (elevate) the infected area above the level of your heart while you are sitting or lying down.  Place cold or warm cloths on the area as told by your doctor.  Keep all follow-up visits as told by your doctor. This is important. Contact a doctor if:  You have a fever.  You do not start to get better after 1-2 days of treatment.  Your bone or joint under the infected area starts to hurt after the skin has healed.  Your infection comes back. This can happen in the same area or another area.  You have a swollen bump in the area.  You have new symptoms.  You feel ill and have muscle aches and pains. Get help right away if:  Your symptoms get worse.  You feel very sleepy.  You throw up (vomit) or have watery poop (diarrhea) for a long time.  You see red streaks coming from the area.  Your red area gets larger.  Your red area turns dark in color. These symptoms may represent a serious problem that is an emergency.  Do not wait to see if the symptoms will go away. Get medical help right away. Call your local emergency services (911 in the [place] Do not drive yourself to the hospital. Summary  Cellulitis is a skin infection. The area is often warm, red, swollen, and sore.  This condition is treated with medicines, rest, and cold and warm cloths.  Take all medicines only as told by your doctor.  Tell your doctor if symptoms do not start to get better after 1-2 days of treatment. This information is not intended to replace advice given to you by your health care provider. Make  sure you discuss any questions you have with your health care provider. Document Revised: 11/13/2017 Document Reviewed: 11/13/2017 Elsevier Patient Education  [place].

## 2020-05-09 DIAGNOSIS — L03119 Cellulitis of unspecified part of limb: Secondary | ICD-10-CM | POA: Diagnosis not present

## 2020-05-10 DIAGNOSIS — L03115 Cellulitis of right lower limb: Secondary | ICD-10-CM | POA: Diagnosis not present

## 2020-05-10 DIAGNOSIS — L03116 Cellulitis of left lower limb: Secondary | ICD-10-CM | POA: Diagnosis not present

## 2020-05-10 DIAGNOSIS — E11628 Type 2 diabetes mellitus with other skin complications: Secondary | ICD-10-CM | POA: Diagnosis not present

## 2020-05-15 ENCOUNTER — Ambulatory Visit (HOSPITAL_COMMUNITY): Payer: Medicare Other | Attending: Podiatry | Admitting: Physical Therapy

## 2020-05-15 ENCOUNTER — Other Ambulatory Visit: Payer: Self-pay

## 2020-05-15 ENCOUNTER — Encounter (HOSPITAL_COMMUNITY): Payer: Self-pay | Admitting: Physical Therapy

## 2020-05-15 DIAGNOSIS — I890 Lymphedema, not elsewhere classified: Secondary | ICD-10-CM | POA: Insufficient documentation

## 2020-05-15 DIAGNOSIS — S81801A Unspecified open wound, right lower leg, initial encounter: Secondary | ICD-10-CM | POA: Insufficient documentation

## 2020-05-15 DIAGNOSIS — M79662 Pain in left lower leg: Secondary | ICD-10-CM | POA: Diagnosis not present

## 2020-05-15 DIAGNOSIS — S81802A Unspecified open wound, left lower leg, initial encounter: Secondary | ICD-10-CM | POA: Diagnosis not present
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
## 2020-05-17 ENCOUNTER — Ambulatory Visit (HOSPITAL_COMMUNITY): Payer: Medicare Other

## 2020-05-17 ENCOUNTER — Encounter (HOSPITAL_COMMUNITY): Payer: Self-pay

## 2020-05-17 ENCOUNTER — Other Ambulatory Visit: Payer: Self-pay

## 2020-05-17 DIAGNOSIS — S81801A Unspecified open wound, right lower leg, initial encounter: Secondary | ICD-10-CM

## 2020-05-17 DIAGNOSIS — I890 Lymphedema, not elsewhere classified: Secondary | ICD-10-CM | POA: Diagnosis not present

## 2020-05-17 DIAGNOSIS — S81802A Unspecified open wound, left lower leg, initial encounter: Secondary | ICD-10-CM

## 2020-05-17 DIAGNOSIS — M79662 Pain in left lower leg: Secondary | ICD-10-CM | POA: Diagnosis not present

## 2020-05-17 NOTE — Therapy (Signed)
[place], [place], [address] Phone: [phone]   Fax:  [phone]  Physical Therapy Treatment  Patient Details  Name: Michelle Skinner MRN: [id number] Date of Birth: [birth year] Referring Provider (PT): Boneta Lucks    Encounter Date: 05/17/2020   PT End of Session - 05/17/20 1820    Visit Number 2    Number of Visits 24    Date for PT Re-Evaluation 07/10/20    Authorization Type UHC medicare but is at [facility] skilled nursing at this time    Progress Note Due on Visit 10    PT Start Time 1005    PT Stop Time 1135    PT Time Calculation (min) 90 min    Activity Tolerance Patient tolerated treatment well    Behavior During Therapy [facility] for tasks assessed/performed           Past Medical History:  Diagnosis Date  . Anemia   . Cervical cancer ([place])   . Depression   . Diabetes mellitus, type II ([place])   . GERD (gastroesophageal reflux disease)   . Hyperlipidemia   . Neuropathy   . PTSD (post-traumatic stress disorder)     Past Surgical History:  Procedure Laterality Date  . ABDOMINAL HYSTERECTOMY    . COLONOSCOPY WITH PROPOFOL N/A 05/25/2018   Procedure: COLONOSCOPY WITH PROPOFOL;  Surgeon: Daneil Dolin, MD;  [facility];  Service: Endoscopy;  Laterality: N/A;  7:30am  . OTHER SURGICAL HISTORY Right    R Foot- I&D  . POLYPECTOMY  05/25/2018   Procedure: POLYPECTOMY;  Surgeon: Daneil Dolin, MD;  [facility];  Service: Endoscopy;;  colon  . UMBILICAL HERNIA REPAIR  2007    There were no vitals filed for this visit.   Subjective Assessment - 05/17/20 1146    Subjective Pt arrived with profore BLE, reports comfort with dressings.  Stated she was sore following last day of treatment with new HEP then completed PT at the SNF she is staying at.  Reports plans to be DC to home on Friday.    Pertinent History DM, morbid obesity, cellulitis    Currently in Pain? Yes    Pain  Score 6     Pain Location Leg    Pain Orientation Left;Anterior;Proximal   Lt anterior thigh   Pain Descriptors / Indicators Aching;Tightness;Throbbing    Pain Type Chronic pain    Pain Onset More than a month ago    Pain Frequency Constant    Aggravating Factors  walking    Pain Relieving Factors tylenol    Effect of Pain on Daily Activities increases                 LYMPHEDEMA/ONCOLOGY QUESTIONNAIRE - 05/17/20 0001      Right Lower Extremity Lymphedema   30 cm Proximal to Suprapatella 104.5 cm    20 cm Proximal to Suprapatella 90 cm    10 cm Proximal to Suprapatella 81.4 cm      Left Lower Extremity Lymphedema   20 cm Proximal to Suprapatella 83.4 cm    10 cm Proximal to Suprapatella 87.6 cm                    Wound Therapy - 05/17/20 1146    Subjective Pt arrived with profore BLE, reports comfort with dressings.  Stated she was sore following last day of treatment with new HEP then completed PT at the  SNF she is staying at.  Reports plans to be DC to home on Friday.    Patient and Family Stated Goals see above     Date of Onset 01/03/20    Prior Treatments self care, antibiotics and IP IV antibiotics     Pain Scale 0-10    Patients Stated Pain Goal 3    Pain Intervention(s) --   compression   Evaluation and Treatment Procedures Explained to Patient/Family Yes    Evaluation and Treatment Procedures agreed to    Wound Properties Date First Assessed: 05/15/20 Time First Assessed: 1100 Wound Type: Other (Comment) Location: Leg Location Orientation: Left;Posterior Wound Description (Comments): able to see multiple areas where serous drainage is tearing up and dropping of LE  Present on Admission: Yes   Dressing Type Bismuth petroleum;Alginate;Compression wrap   xeroform, alginate, profore   Dressing Changed Changed    Dressing Status Old drainage    Dressing Change Frequency PRN    Site / Wound Assessment Friable;Red    % Wound base Red or Granulating 100%     % Wound base Yellow/Fibrinous Exudate 0%    Peri-wound Assessment Edema;Induration;Erythema (blanchable)    Drainage Amount Moderate    Drainage Description Serous;Sanguineous    Treatment Cleansed;Other (Comment)   compression wrap   Wound Properties Date First Assessed: 05/15/20 Time First Assessed: 1029 Wound Type: Other (Comment) Location: Leg Location Orientation: Right Wound Description (Comments): posterior aspect Present on Admission: Yes   Dressing Type Bismuth petroleum;Alginate;Compression wrap   xeroform, alginate, short stretch with 1/2in foam   Dressing Changed Changed    Dressing Status Old drainage    Dressing Change Frequency PRN    Site / Wound Assessment Pink;Yellow    % Wound base Red or Granulating 95%    % Wound base Yellow/Fibrinous Exudate 5%    Peri-wound Assessment Edema;Induration;Erythema (blanchable)    Drainage Amount Minimal    Drainage Description Serous    Treatment Cleansed;Other (Comment)   compression wrap   Wound Therapy - Clinical Statement Measurements complete for Bil thigh as well as foam cut for distal extremity.  Wounds are progressing well with minimal drainiange on Rt and overall edema has reduced.  Changed Rt LE to short stretch dressings and continued with profore on Lt LE.  Assess drainage next session for possible change to BLE short stretch.  Pt instructed proper care of new dressings with verbalized understanding.      Wound Therapy - Functional Problem List decreased abiltity of bath  self, walk, climb steps     Factors Delaying/Impairing Wound Healing Altered sensation;Diabetes Mellitus;Infection - systemic/local;Immobility;Polypharmacy;Vascular compromise    Hydrotherapy Plan Dressing change;Patient/family education;Other (comment)   total decongestive techniques   Wound Therapy - Frequency 3X / week   for 8 weeks   Wound Therapy - Current Recommendations PT    Wound Plan total decongestive techniques     Dressing  Rt: xeroform, 4x4, 1/2in  foam and short stretch to the knee    Dressing Lt xeroform, alginate, 4x4, profore                       PT Short Term Goals - 05/15/20 1556      PT SHORT TERM GOAL #1   Title Pt wounds to have healed B to reduce risk of reinfection    Time 4    Period Weeks    Status New    Target Date 06/12/20      PT  SHORT TERM GOAL #2   Title PT to have lost 5 cm in circumference of LE at 30 and 20 cm to improve pt ability to lift leg for stair climbing.    Time 4    Period Weeks    Status New      PT SHORT TERM GOAL #3   Title PT to be completing her HEP on a daily basis    Time 4    Period Weeks    Status New             PT Long Term Goals - 05/15/20 1558      PT LONG TERM GOAL #1   Title PT to have lost 10 cm of fluid from 30 and 20 cm to allow pt to be able to go up 3 flights of steps to her apartment.    Time 8    Period Weeks    Status New    Target Date 07/10/20      PT LONG TERM GOAL #2   Title PT to have and be able to don compression garments    Time 8    Period Weeks    Status New      PT LONG TERM GOAL #3   Title PT to have and be using compression pump    Time 8    Period Weeks                  Patient will benefit from skilled therapeutic intervention in order to improve the following deficits and impairments:     Visit Diagnosis: Lymphedema, not elsewhere classified  Pain in left lower leg  Open wound of left lower leg, initial encounter  Open wound of right lower leg, initial encounter     Problem List Patient Active Problem List   Diagnosis Date Noted  . Cellulitis 04/27/2020  . AKI (acute kidney injury) ([place]) 04/27/2020  . Stasis dermatitis 04/27/2020  . Anxiety 03/31/2020  . Depression 03/31/2020  . GERD (gastroesophageal reflux disease) 03/31/2020  . Posttraumatic stress disorder 03/31/2020  . Personal history of noncompliance with medical treatment, presenting hazards to health 02/09/2018  . Abnormal CT of the  abdomen 10/20/2017  . Dilated cbd, acquired 10/20/2017  . Essential hypertension, benign 01/14/2017  . Primary hypothyroidism 05/03/2015  . Mixed hyperlipidemia 05/03/2015  . Uncontrolled type 2 diabetes mellitus with complication, with long-term current use of insulin ([place]) 04/25/2015  . Morbid obesity due to excess calories ([place]) 04/25/2015  . Vitamin D deficiency 04/25/2015  . Cervical cancer ([place]) 10/05/2012   Ihor Austin, LPTA/CLT; CBIS [phone]  Aldona Lento 05/17/2020, 6:22 PM  [facility] [address] [place], [place], [address] Phone: [phone]   Fax:  [phone]  Name: Michelle Skinner MRN: [id number] Date of Birth: 03/17/1966

## 2020-05-19 ENCOUNTER — Ambulatory Visit (HOSPITAL_COMMUNITY): Payer: Medicare Other

## 2020-05-19 ENCOUNTER — Telehealth (HOSPITAL_COMMUNITY): Payer: Self-pay

## 2020-05-19 NOTE — Telephone Encounter (Signed)
No show, called though unable to leave message as mailbox is full.  Ihor Austin, LPTA/CLT; Delana Meyer [phone]

## 2020-05-22 ENCOUNTER — Ambulatory Visit (HOSPITAL_COMMUNITY): Payer: Medicare Other | Admitting: Physical Therapy

## 2020-05-22 ENCOUNTER — Other Ambulatory Visit: Payer: Self-pay

## 2020-05-22 DIAGNOSIS — S81801A Unspecified open wound, right lower leg, initial encounter: Secondary | ICD-10-CM | POA: Diagnosis not present

## 2020-05-22 DIAGNOSIS — I890 Lymphedema, not elsewhere classified: Secondary | ICD-10-CM | POA: Diagnosis not present

## 2020-05-22 DIAGNOSIS — S81802A Unspecified open wound, left lower leg, initial encounter: Secondary | ICD-10-CM | POA: Diagnosis not present

## 2020-05-22 DIAGNOSIS — M79662 Pain in left lower leg: Secondary | ICD-10-CM | POA: Diagnosis not present

## 2020-05-22 NOTE — Therapy (Signed)
Michelle Skinner, [place], [address] Phone: [phone]   Fax:  [phone]  Wound Care Therapy  Patient Details  Name: Michelle Skinner MRN: [id number] Date of Birth: Dec 11, 1965 Referring Provider (PT): Boneta Lucks    Encounter Date: 05/22/2020   PT End of Session - 05/22/20 1320    Visit Number 3    Number of Visits 24    Date for PT Re-Evaluation 07/10/20    Authorization Type UHC medicare but is at [facility] skilled nursing at this time    Progress Note Due on Visit 10    PT Start Time [phone]    PT Stop Time 1002    PT Time Calculation (min) 84 min    Activity Tolerance Patient tolerated treatment well    Behavior During Therapy [facility] for tasks assessed/performed           Past Medical History:  Diagnosis Date  . Anemia   . Cervical cancer ([place])   . Depression   . Diabetes mellitus, type II ([place])   . GERD (gastroesophageal reflux disease)   . Hyperlipidemia   . Neuropathy   . PTSD (post-traumatic stress disorder)     Past Surgical History:  Procedure Laterality Date  . ABDOMINAL HYSTERECTOMY    . COLONOSCOPY WITH PROPOFOL N/A 05/25/2018   Procedure: COLONOSCOPY WITH PROPOFOL;  Surgeon: Daneil Dolin, MD;  [facility];  Service: Endoscopy;  Laterality: N/A;  7:30am  . OTHER SURGICAL HISTORY Right    R Foot- I&D  . POLYPECTOMY  05/25/2018   Procedure: POLYPECTOMY;  Surgeon: Daneil Dolin, MD;  [facility];  Service: Endoscopy;;  colon  . UMBILICAL HERNIA REPAIR  2007    There were no vitals filed for this visit.               Wound Therapy - 05/22/20 1317    Subjective pt with dressings intact.   NO complaints of pain.    Patient and Family Stated Goals see above     Date of Onset 01/03/20    Prior Treatments self care, antibiotics and IP IV antibiotics     Pain Scale 0-10    Pain Score 0-No pain    Evaluation and Treatment Procedures Explained to  Patient/Family Yes    Evaluation and Treatment Procedures agreed to    Wound Properties Date First Assessed: 05/15/20 Time First Assessed: 1100 Wound Type: Other (Comment) Location: Leg Location Orientation: Left;Posterior Wound Description (Comments): able to see multiple areas where serous drainage is tearing up and dropping of LE  Present on Admission: Yes   Dressing Type Bismuth petroleum;Gauze (Comment);Compression wrap    Dressing Changed Changed    Dressing Status Old drainage    Dressing Change Frequency PRN    Site / Wound Assessment Red    % Wound base Red or Granulating 100%    % Wound base Yellow/Fibrinous Exudate 0%    Peri-wound Assessment Edema;Induration    Drainage Amount Scant    Drainage Description Serous    Treatment Debridement (Selective);Cleansed    Wound Properties Date First Assessed: 05/15/20 Time First Assessed: 1029 Wound Type: Other (Comment) Location: Leg Location Orientation: Right Wound Description (Comments): posterior aspect Present on Admission: Yes   Dressing Type Bismuth petroleum;Gauze (Comment);Compression wrap    Dressing Changed Changed    Dressing Status Old drainage    Dressing Change Frequency PRN    Site / Wound Assessment Red    %  Wound base Red or Granulating 100%    % Wound base Yellow/Fibrinous Exudate 0%    Peri-wound Assessment Edema;Induration    Drainage Amount None    Treatment Cleansed    Wound Therapy - Clinical Statement Pt did not remove her short stretch on Rt prior to session and with noted wetness bottom of bandaging from sock wear only.  Instructed pt to go get shoes that will fit properly over dressings rather than using socks.   Profore remained intact on Lt and without drain through.  Drainage now scant on Lt LE and no drainage on Rt LE.  Dryness remains as well as erythema/heat.    Began short stretch on Lt LE as well today.  Manual lymph drainage completed f/b debridement of devitalized tissue, moisturing of LE's and  rebandaging.  Pt reported overall comfort.  Used surgical boots on feet today.    Wound Therapy - Functional Problem List decreased abiltity of bath  self, walk, climb steps     Factors Delaying/Impairing Wound Healing Altered sensation;Diabetes Mellitus;Infection - systemic/local;Immobility;Polypharmacy;Vascular compromise    Hydrotherapy Plan Dressing change;Patient/family education;Other (comment)   total decongestive techniques   Wound Therapy - Frequency 3X / week   for 8 weeks   Wound Therapy - Current Recommendations PT    Wound Plan total decongestive techniques     Dressing  Rt: xeroform, 4x4, 1/2in foam and short stretch to the knee    Dressing Lt xeroform, alginate, 4x4, profore     Manual Therapy manual lymph drainage for bilateral LE's both anterior and posterior                     PT Short Term Goals - 05/15/20 1556      PT SHORT TERM GOAL #1   Title Pt wounds to have healed B to reduce risk of reinfection    Time 4    Period Weeks    Status New    Target Date 06/12/20      PT SHORT TERM GOAL #2   Title PT to have lost 5 cm in circumference of LE at 30 and 20 cm to improve pt ability to lift leg for stair climbing.    Time 4    Period Weeks    Status New      PT SHORT TERM GOAL #3   Title PT to be completing her HEP on a daily basis    Time 4    Period Weeks    Status New             PT Long Term Goals - 05/15/20 1558      PT LONG TERM GOAL #1   Title PT to have lost 10 cm of fluid from 30 and 20 cm to allow pt to be able to go up 3 flights of steps to her apartment.    Time 8    Period Weeks    Status New    Target Date 07/10/20      PT LONG TERM GOAL #2   Title PT to have and be able to don compression garments    Time 8    Period Weeks    Status New      PT LONG TERM GOAL #3   Title PT to have and be using compression pump    Time 8    Period Weeks  Patient will benefit from skilled therapeutic  intervention in order to improve the following deficits and impairments:     Visit Diagnosis: Pain in left lower leg  Lymphedema, not elsewhere classified  Open wound of left lower leg, initial encounter  Open wound of right lower leg, initial encounter     Problem List Patient Active Problem List   Diagnosis Date Noted  . Cellulitis 04/27/2020  . AKI (acute kidney injury) ([place]) 04/27/2020  . Stasis dermatitis 04/27/2020  . Anxiety 03/31/2020  . Depression 03/31/2020  . GERD (gastroesophageal reflux disease) 03/31/2020  . Posttraumatic stress disorder 03/31/2020  . Personal history of noncompliance with medical treatment, presenting hazards to health 02/09/2018  . Abnormal CT of the abdomen 10/20/2017  . Dilated cbd, acquired 10/20/2017  . Essential hypertension, benign 01/14/2017  . Primary hypothyroidism 05/03/2015  . Mixed hyperlipidemia 05/03/2015  . Uncontrolled type 2 diabetes mellitus with complication, with long-term current use of insulin ([place]) 04/25/2015  . Morbid obesity due to excess calories ([place]) 04/25/2015  . Vitamin D deficiency 04/25/2015  . Cervical cancer ([place]) 10/05/2012   [name] Irani, PTA/CLT [phone]  [name] Irani 05/22/2020, 1:22 PM  [facility] [address] [place], [place], [address] Phone: [phone]   Fax:  [phone]  Name: TAREA SKILLMAN MRN: [id number] Date of Birth: September 19, 1965

## 2020-05-24 ENCOUNTER — Ambulatory Visit (HOSPITAL_COMMUNITY): Payer: Medicare Other

## 2020-05-24 ENCOUNTER — Encounter (HOSPITAL_COMMUNITY): Payer: Self-pay

## 2020-05-24 ENCOUNTER — Other Ambulatory Visit: Payer: Self-pay

## 2020-05-24 DIAGNOSIS — I890 Lymphedema, not elsewhere classified: Secondary | ICD-10-CM | POA: Diagnosis not present

## 2020-05-24 DIAGNOSIS — M79662 Pain in left lower leg: Secondary | ICD-10-CM | POA: Diagnosis not present

## 2020-05-24 DIAGNOSIS — S81801A Unspecified open wound, right lower leg, initial encounter: Secondary | ICD-10-CM

## 2020-05-24 DIAGNOSIS — S81802A Unspecified open wound, left lower leg, initial encounter: Secondary | ICD-10-CM | POA: Diagnosis not present

## 2020-05-24 NOTE — Therapy (Signed)
[facility] [address] [place], [place], [address] Phone: [phone]   Fax:  [phone]  Physical Therapy Treatment  Patient Details  Name: Michelle Skinner MRN: [id number] Date of Birth: August 04, 1965 Referring Provider (PT): Boneta Lucks    Encounter Date: 05/24/2020   PT End of Session - 05/24/20 1403    Visit Number 4    Number of Visits 24    Date for PT Re-Evaluation 07/10/20    Authorization Type UHC medicare but is at [facility] skilled nursing at this time    Progress Note Due on Visit 10    PT Start Time 1000    PT Stop Time 1135    PT Time Calculation (min) 95 min    Activity Tolerance Patient tolerated treatment well    Behavior During Therapy [facility] for tasks assessed/performed           Past Medical History:  Diagnosis Date   Anemia    Cervical cancer ([place])    Depression    Diabetes mellitus, type II ([place])    GERD (gastroesophageal reflux disease)    Hyperlipidemia    Neuropathy    PTSD (post-traumatic stress disorder)     Past Surgical History:  Procedure Laterality Date   ABDOMINAL HYSTERECTOMY     COLONOSCOPY WITH PROPOFOL N/A 05/25/2018   Procedure: COLONOSCOPY WITH PROPOFOL;  Surgeon: Daneil Dolin, MD;  [facility];  Service: Endoscopy;  Laterality: N/A;  7:30am   OTHER SURGICAL HISTORY Right    R Foot- I&D   POLYPECTOMY  05/25/2018   Procedure: POLYPECTOMY;  Surgeon: Daneil Dolin, MD;  [facility];  Service: Endoscopy;;  colon   UMBILICAL HERNIA REPAIR  2007    There were no vitals filed for this visit.   Subjective Assessment - 05/24/20 1346    Subjective Pt stated it has been rough with return to home, having to do 3 flights of stairs and some agruments with husband.  Does feel safe at home.  Reports dressings were comfortable and arrived with bandages rolled.  Stated she is unable to receive home health due to insurance not paying for [facility] and OPPT.     Pertinent History DM, morbid obesity, cellulitis    Currently in Pain? No/denies                 LYMPHEDEMA/ONCOLOGY QUESTIONNAIRE - 05/24/20 0001      Right Lower Extremity Lymphedema   20 cm Proximal to Suprapatella 88 cm   was 90   10 cm Proximal to Suprapatella 79.5 cm   was 81.4   At Midpatella/Popliteal Crease 55 cm   was 55.3   30 cm Proximal to Floor at Lateral Plantar Foot 60.3 cm   was 64   20 cm Proximal to Floor at Lateral Plantar Foot 57.5 1   was 61.8   10 cm Proximal to Floor at Lateral Malleoli 45 cm   was 45.3   Circumference of ankle/heel 40.5 cm.   was 41.2   5 cm Proximal to 1st MTP Joint 28.7 cm   was 29.8   Across MTP Joint 28 cm   was 29.4   Around Proximal Great Toe 11.5 cm   was 12.5     Left Lower Extremity Lymphedema   20 cm Proximal to Suprapatella 83 cm   was 83.4   10 cm Proximal to Suprapatella 86.8 cm   was 87.6   At Midpatella/Popliteal Crease 58  cm   was 58.4   30 cm Proximal to Floor at Lateral Plantar Foot 63.7 cm   was 77.5   20 cm Proximal to Floor at Lateral Plantar Foot 62.6 cm   was 70.9   10 cm Proximal to Floor at Lateral Malleoli 51.3 cm   was 53   Circumference of ankle/heel 36.7 cm.   was 40.5   5 cm Proximal to 1st MTP Joint 28.7 cm   was 29.7   Across MTP Joint 24.5 cm   was 29.2   Around Proximal Great Toe 11.7 cm   was 13.5                   Wound Therapy - 05/24/20 0001    Subjective Pt stated it has been rough with return to home, having to do 3 flights of stairs and some agruments with husband.  Does feel safe at home.  Reports dressings were comfortable and arrived with bandages rolled.    Patient and Family Stated Goals see above     Date of Onset 01/03/20    Prior Treatments self care, antibiotics and IP IV antibiotics     Pain Scale 0-10    Pain Score 0-No pain    Evaluation and Treatment Procedures Explained to Patient/Family Yes    Evaluation and Treatment Procedures agreed to    Wound Properties  Date First Assessed: 05/15/20 Time First Assessed: 1100 Wound Type: Other (Comment) Location: Leg Location Orientation: Left;Posterior Wound Description (Comments): able to see multiple areas where serous drainage is tearing up and dropping of LE  Present on Admission: Yes   Dressing Type Bismuth petroleum;Compression wrap    Dressing Changed Changed    Dressing Status Old drainage    Dressing Change Frequency PRN    Site / Wound Assessment Red    % Wound base Red or Granulating 100%    % Wound base Yellow/Fibrinous Exudate 0%    Peri-wound Assessment Edema;Induration    Drainage Amount Scant    Drainage Description Serous    Treatment Cleansed    Wound Properties Date First Assessed: 05/15/20 Time First Assessed: 1751 Wound Type: Other (Comment) Location: Leg Location Orientation: Right Wound Description (Comments): posterior aspect Present on Admission: Yes   Dressing Type Bismuth petroleum;Compression wrap    Dressing Changed Changed    Dressing Status Old drainage    Dressing Change Frequency PRN    Site / Wound Assessment Red    % Wound base Red or Granulating 100%    % Wound base Yellow/Fibrinous Exudate 0%    Peri-wound Assessment Edema;Induration    Drainage Amount None    Treatment Cleansed    Wound Therapy - Clinical Statement Drainage scant on Lt and no drainage on Rt.  Cleansed and removal of dry skin only necessary with wound care.  Continued wiht xeroform and gauze then knee high multilayer short stretch bandages.  Measurements complete this session wiht noted reduction wiht all measurements BLE.  Educated self massage techniques to trunk and thigh, verbalized understanding.      Wound Therapy - Functional Problem List decreased abiltity of bath  self, walk, climb steps     Factors Delaying/Impairing Wound Healing Altered sensation;Diabetes Mellitus;Infection - systemic/local;Immobility;Polypharmacy;Vascular compromise    Hydrotherapy Plan Dressing change;Patient/family  education;Other (comment)   total decognitive lymph techniques   Wound Therapy - Frequency 3X / week   8 weeks   Wound Therapy - Current Recommendations PT    Wound Plan total  decongestive techniques     Dressing  BLE: xeroform, gauze, short stretch bandages to knee wiht 1/2 in foam    Manual Therapy manual lymph drainage for bilateral LE's both anterior and posterior            [facility] Adult PT Treatment/Exercise - 05/24/20 0001      Manual Therapy   Manual Therapy Manual Lymphatic Drainage (MLD);Compression Bandaging;Other (comment)    Manual therapy comments Manual complete separate than rest of tx    Manual Lymphatic Drainage (MLD) Manual lymph decognitive technique anterior only today    Compression Bandaging Bil knee high multilayer short stretch bandages with 1/2 in foam    Other Manual Therapy Measurements complete; Self care manual instructed                    PT Short Term Goals - 05/15/20 1556      PT SHORT TERM GOAL #1   Title Pt wounds to have healed B to reduce risk of reinfection    Time 4    Period Weeks    Status New    Target Date 06/12/20      PT SHORT TERM GOAL #2   Title PT to have lost 5 cm in circumference of LE at 30 and 20 cm to improve pt ability to lift leg for stair climbing.    Time 4    Period Weeks    Status New      PT SHORT TERM GOAL #3   Title PT to be completing her HEP on a daily basis    Time 4    Period Weeks    Status New             PT Long Term Goals - 05/15/20 1558      PT LONG TERM GOAL #1   Title PT to have lost 10 cm of fluid from 30 and 20 cm to allow pt to be able to go up 3 flights of steps to her apartment.    Time 8    Period Weeks    Status New    Target Date 07/10/20      PT LONG TERM GOAL #2   Title PT to have and be able to don compression garments    Time 8    Period Weeks    Status New      PT LONG TERM GOAL #3   Title PT to have and be using compression pump    Time 8    Period Weeks                   Patient will benefit from skilled therapeutic intervention in order to improve the following deficits and impairments:     Visit Diagnosis: Open wound of left lower leg, initial encounter  Open wound of right lower leg, initial encounter  Lymphedema, not elsewhere classified  Pain in left lower leg     Problem List Patient Active Problem List   Diagnosis Date Noted   Cellulitis 04/27/2020   AKI (acute kidney injury) ([place]) 04/27/2020   Stasis dermatitis 04/27/2020   Anxiety 03/31/2020   Depression 03/31/2020   GERD (gastroesophageal reflux disease) 03/31/2020   Posttraumatic stress disorder 03/31/2020   Personal history of noncompliance with medical treatment, presenting hazards to health 02/09/2018   Abnormal CT of the abdomen 10/20/2017   Dilated cbd, acquired 10/20/2017   Essential hypertension, benign 01/14/2017   Primary hypothyroidism 05/03/2015  Mixed hyperlipidemia 05/03/2015   Uncontrolled type 2 diabetes mellitus with complication, with long-term current use of insulin ([place]) 04/25/2015   Morbid obesity due to excess calories ([place]) 04/25/2015   Vitamin D deficiency 04/25/2015   Cervical cancer ([place]) 10/05/2012   Michelle Skinner, LPTA/CLT; CBIS [phone]  Aldona Lento 05/24/2020, 2:04 PM  [facility] [address] [place], [place], [address] Phone: [phone]   Fax:  [phone]  Name: BRAILYNN BRETH MRN: [id number] Date of Birth: 08-05-1965

## 2020-05-26 ENCOUNTER — Ambulatory Visit (HOSPITAL_COMMUNITY): Payer: Medicare Other | Admitting: Physical Therapy

## 2020-05-26 ENCOUNTER — Other Ambulatory Visit: Payer: Self-pay

## 2020-05-26 DIAGNOSIS — S81801A Unspecified open wound, right lower leg, initial encounter: Secondary | ICD-10-CM | POA: Diagnosis not present

## 2020-05-26 DIAGNOSIS — I890 Lymphedema, not elsewhere classified: Secondary | ICD-10-CM | POA: Diagnosis not present

## 2020-05-26 DIAGNOSIS — S81802A Unspecified open wound, left lower leg, initial encounter: Secondary | ICD-10-CM

## 2020-05-26 DIAGNOSIS — M79662 Pain in left lower leg: Secondary | ICD-10-CM

## 2020-05-26 NOTE — Therapy (Signed)
[place], [place], [address] Phone: [phone]   Fax:  [phone]  Wound Care Therapy  Patient Details  Name: Michelle Skinner MRN: [id number] Date of Birth: 10-15-1965 Referring Provider (PT): Boneta Lucks    Encounter Date: 05/26/2020   PT End of Session - 05/26/20 1158    Visit Number 5    Number of Visits 24    Date for PT Re-Evaluation 07/10/20    Authorization Type UHC medicare but is at [facility] skilled nursing at this time    Progress Note Due on Visit 10    PT Start Time 1006    PT Stop Time 1125    PT Time Calculation (min) 79 min    Activity Tolerance Patient tolerated treatment well    Behavior During Therapy [facility] for tasks assessed/performed           Past Medical History:  Diagnosis Date  . Anemia   . Cervical cancer ([place])   . Depression   . Diabetes mellitus, type II ([place])   . GERD (gastroesophageal reflux disease)   . Hyperlipidemia   . Neuropathy   . PTSD (post-traumatic stress disorder)     Past Surgical History:  Procedure Laterality Date  . ABDOMINAL HYSTERECTOMY    . COLONOSCOPY WITH PROPOFOL N/A 05/25/2018   Procedure: COLONOSCOPY WITH PROPOFOL;  Surgeon: Daneil Dolin, MD;  [facility];  Service: Endoscopy;  Laterality: N/A;  7:30am  . OTHER SURGICAL HISTORY Right    R Foot- I&D  . POLYPECTOMY  05/25/2018   Procedure: POLYPECTOMY;  Surgeon: Daneil Dolin, MD;  Location[facility];  Service: Endoscopy;;  colon  . UMBILICAL HERNIA REPAIR  2007    There were no vitals filed for this visit.    Subjective Assessment - 05/26/20 1153    Subjective pt states they still do not have hot water.  Hoping to get this fixed asap.  States she has not found a podiatrist yet.    Currently in Pain? No/denies                     Wound Therapy - 05/26/20 1155    Subjective Pt stated it has been rough with return to home, having to do 3 flights of stairs  and some agruments with husband.  Does feel safe at home.  Reports dressings were comfortable and arrived with bandages rolled.    Patient and Family Stated Goals see above     Date of Onset 01/03/20    Prior Treatments self care, antibiotics and IP IV antibiotics     Evaluation and Treatment Procedures Explained to Patient/Family Yes    Evaluation and Treatment Procedures agreed to    Wound Properties Date First Assessed: 05/15/20 Time First Assessed: 1100 Wound Type: Other (Comment) Location: Leg Location Orientation: Left;Posterior Wound Description (Comments): able to see multiple areas where serous drainage is tearing up and dropping of LE  Present on Admission: Yes   Dressing Type Bismuth petroleum    Dressing Changed Changed    Dressing Status None    Dressing Change Frequency PRN    Site / Wound Assessment Red    % Wound base Red or Granulating 100%    % Wound base Yellow/Fibrinous Exudate 0%    Peri-wound Assessment Edema;Induration;Erythema (blanchable)    Drainage Amount None    Treatment Cleansed    Wound Properties Date First Assessed: 05/15/20 Time First Assessed:  1029 Wound Type: Other (Comment) Location: Leg Location Orientation: Right Wound Description (Comments): posterior aspect Present on Admission: Yes   Dressing Type Bismuth petroleum    Dressing Changed Changed    Dressing Status Old drainage    Dressing Change Frequency PRN    Site / Wound Assessment Red    % Wound base Red or Granulating 100%    % Wound base Yellow/Fibrinous Exudate 0%    Peri-wound Assessment Erythema (blanchable);Edema;Induration    Drainage Amount Scant    Treatment Cleansed;Debridement (Selective)    Wound Therapy - Clinical Statement see below assessment; wounds are tiny areas no longer measureable with scant if any drainage.    Wound Therapy - Functional Problem List decreased abiltity of bath  self, walk, climb steps     Factors Delaying/Impairing Wound Healing Altered sensation;Diabetes  Mellitus;Infection - systemic/local;Immobility;Polypharmacy;Vascular compromise    Hydrotherapy Plan Dressing change;Patient/family education;Other (comment)   total decognitive lymph techniques   Wound Therapy - Frequency 3X / week   8 weeks   Wound Therapy - Current Recommendations PT    Wound Plan total decongestive techniques     Dressing  BLE: xeroform, gauze, short stretch bandages to knee wiht 1/2 in foam    Manual Therapy manual lymph drainage for bilateral LE's both anterior and posterior           [facility] Adult PT Treatment/Exercise - 05/26/20 0001      Manual Therapy   Manual Therapy Manual Lymphatic Drainage (MLD);Compression Bandaging;Other (comment)    Manual therapy comments Manual complete separate than rest of tx    Manual Lymphatic Drainage (MLD) Manual lymph decognitive technique anterior only today    Compression Bandaging Bil knee high multilayer short stretch bandages with 1/2 in foam                  PT Education - 05/26/20 1153    Education Details Call old podiatrist off (Dr Berline Lopes retired) and ask if one of his collegues can see her. Importance of making appt asap.    Person(s) Educated Patient    Methods Explanation    Comprehension Verbalized understanding            PT Short Term Goals - 05/15/20 1556      PT SHORT TERM GOAL #1   Title Pt wounds to have healed B to reduce risk of reinfection    Time 4    Period Weeks    Status New    Target Date 06/12/20      PT SHORT TERM GOAL #2   Title PT to have lost 5 cm in circumference of LE at 30 and 20 cm to improve pt ability to lift leg for stair climbing.    Time 4    Period Weeks    Status New      PT SHORT TERM GOAL #3   Title PT to be completing her HEP on a daily basis    Time 4    Period Weeks    Status New             PT Long Term Goals - 05/15/20 1558      PT LONG TERM GOAL #1   Title PT to have lost 10 cm of fluid from 30 and 20 cm to allow pt to be able to go up 3  flights of steps to her apartment.    Time 8    Period Weeks    Status New  Target Date 07/10/20      PT LONG TERM GOAL #2   Title PT to have and be able to don compression garments    Time 8    Period Weeks    Status New      PT LONG TERM GOAL #3   Title PT to have and be using compression pump    Time 8    Period Weeks                 Plan - 05/26/20 1157    Clinical Impression Statement Very scant to any drainage with only small areas remaining.  Skin very dry.  Again discussed making a podiatrist appt a priority as the callous needs care on her great toes.  Noted today the Lt great toe is now split open and measuring 3X0.5X0.3 cm.  Placed a dressing over this to keep it clean and prevent infection.  Cleansed LE and moisturized well following manual lymph drainage.    Personal Factors and Comorbidities Comorbidity 3+    Comorbidities hx of cellulitis, morbid obesity and DM    Examination-Activity Limitations Bed Mobility;Carry;Dressing;Hygiene/Grooming;Locomotion Level;Stand;Stairs;Toileting    Examination-Participation Restrictions Cleaning;Church;Laundry;Driving;Community Activity;Meal Prep;Shop    Stability/Clinical Decision Making Evolving/Moderate complexity    Rehab Potential Good    PT Frequency 3x / week    PT Duration 8 weeks    PT Treatment/Interventions Patient/family education;Manual lymph drainage;Compression bandaging;Therapeutic exercise   wound care.   PT Next Visit Plan Continue with wound care and total decongestive techniques    PT Home Exercise Plan ankle pump, LAQ, hip ab/adduction, marching and diaphragmic breathing    Consulted and Agree with Plan of Care Patient           Patient will benefit from skilled therapeutic intervention in order to improve the following deficits and impairments:  Decreased activity tolerance, Decreased endurance, Decreased mobility, Difficulty walking, Decreased skin integrity, Obesity, Increased edema  Visit  Diagnosis: Open wound of left lower leg, initial encounter  Open wound of right lower leg, initial encounter  Lymphedema, not elsewhere classified  Pain in left lower leg     Problem List Patient Active Problem List   Diagnosis Date Noted  . Cellulitis 04/27/2020  . AKI (acute kidney injury) ([place]) 04/27/2020  . Stasis dermatitis 04/27/2020  . Anxiety 03/31/2020  . Depression 03/31/2020  . GERD (gastroesophageal reflux disease) 03/31/2020  . Posttraumatic stress disorder 03/31/2020  . Personal history of noncompliance with medical treatment, presenting hazards to health 02/09/2018  . Abnormal CT of the abdomen 10/20/2017  . Dilated cbd, acquired 10/20/2017  . Essential hypertension, benign 01/14/2017  . Primary hypothyroidism 05/03/2015  . Mixed hyperlipidemia 05/03/2015  . Uncontrolled type 2 diabetes mellitus with complication, with long-term current use of insulin ([place]) 04/25/2015  . Morbid obesity due to excess calories ([place]) 04/25/2015  . Vitamin D deficiency 04/25/2015  . Cervical cancer ([place]) 10/05/2012   [name] Irani, PTA/CLT [phone]  [name] Irani 05/26/2020, 11:59 AM  [place], [place], [address] Phone: [phone]   Fax:  [phone]  Name: RINDA ROLLYSON MRN: [id number] Date of Birth: [birth year]

## 2020-05-29 ENCOUNTER — Other Ambulatory Visit: Payer: Self-pay

## 2020-05-29 ENCOUNTER — Ambulatory Visit (HOSPITAL_COMMUNITY): Payer: Medicare Other | Admitting: Physical Therapy

## 2020-05-29 DIAGNOSIS — S81801A Unspecified open wound, right lower leg, initial encounter: Secondary | ICD-10-CM | POA: Diagnosis not present

## 2020-05-29 DIAGNOSIS — I890 Lymphedema, not elsewhere classified: Secondary | ICD-10-CM

## 2020-05-29 DIAGNOSIS — M79662 Pain in left lower leg: Secondary | ICD-10-CM | POA: Diagnosis not present

## 2020-05-29 DIAGNOSIS — S81802A Unspecified open wound, left lower leg, initial encounter: Secondary | ICD-10-CM

## 2020-05-29 NOTE — Therapy (Signed)
Michelle Skinner, [place], [address] Phone: [phone]   Fax:  [phone]  Physical Therapy Treatment  Patient Details  Name: Michelle Skinner MRN: [id number] Date of Birth: 1966-01-25 Referring Provider (PT): Michelle Skinner    Encounter Date: 05/29/2020   PT End of Session - 05/29/20 1653    Visit Number 6    Number of Visits 24    Date for PT Re-Evaluation 07/10/20    Authorization Type UHC medicare but is at [facility] skilled nursing at this time    Progress Note Due on Visit 10    PT Start Time 1455    PT Stop Time 1605    PT Time Calculation (min) 70 min    Activity Tolerance Patient tolerated treatment well    Behavior During Therapy [facility] for tasks assessed/performed           Past Medical History:  Diagnosis Date  . Anemia   . Cervical cancer ([place])   . Depression   . Diabetes mellitus, type II ([place])   . GERD (gastroesophageal reflux disease)   . Hyperlipidemia   . Neuropathy   . PTSD (post-traumatic stress disorder)     Past Surgical History:  Procedure Laterality Date  . ABDOMINAL HYSTERECTOMY    . COLONOSCOPY WITH PROPOFOL N/A 05/25/2018   Procedure: COLONOSCOPY WITH PROPOFOL;  Surgeon: Daneil Dolin, MD;  [facility];  Service: Endoscopy;  Laterality: N/A;  7:30am  . OTHER SURGICAL HISTORY Right    R Foot- I&D  . POLYPECTOMY  05/25/2018   Procedure: POLYPECTOMY;  Surgeon: Daneil Dolin, MD;  [facility];  Service: Endoscopy;;  colon  . UMBILICAL HERNIA REPAIR  2007    There were no vitals filed for this visit.   Subjective Assessment - 05/29/20 1651    Subjective pt states her gastroparesis is flaired up and she has been nauseated and with diarhea.  States she removed her bandages last night.  States she has not contacted the podiatrist yet.    Currently in Pain? No/denies                             [facility] Adult PT Treatment/Exercise - 05/29/20  0001      Manual Therapy   Manual Therapy Manual Lymphatic Drainage (MLD);Compression Bandaging;Other (comment)    Manual therapy comments Manual complete separate than rest of tx    Manual Lymphatic Drainage (MLD) Manual lymph decognitive technique anterior only today    Compression Bandaging Bil knee high multilayer short stretch bandages with 1/2 in foam                    PT Short Term Goals - 05/15/20 1556      PT SHORT TERM GOAL #1   Title Pt wounds to have healed B to reduce risk of reinfection    Time 4    Period Weeks    Status New    Target Date 06/12/20      PT SHORT TERM GOAL #2   Title PT to have lost 5 cm in circumference of LE at 30 and 20 cm to improve pt ability to lift leg for stair climbing.    Time 4    Period Weeks    Status New      PT SHORT TERM GOAL #3   Title PT to be completing her HEP on  a daily basis    Time 4    Period Weeks    Status New             PT Long Term Goals - 05/15/20 1558      PT LONG TERM GOAL #1   Title PT to have lost 10 cm of fluid from 30 and 20 cm to allow pt to be able to go up 3 flights of steps to her apartment.    Time 8    Period Weeks    Status New    Target Date 07/10/20      PT LONG TERM GOAL #2   Title PT to have and be able to don compression garments    Time 8    Period Weeks    Status New      PT LONG TERM GOAL #3   Title PT to have and be using compression pump    Time 8    Period Weeks                 Plan - 05/29/20 1657    Clinical Impression Statement Pt with difficuty maintaining comfort due to nausea today and had to vomit several times during session due to gastroparesis.  wounds are now completely healed and redness has reduced drastically.  Cleansed LE's well and moisturized following manual lymph drainage.  Urged pateint to call podiatirst tomorrow as both calloused plantar surfaces are cracked open on great toes.   Pt verbalized understanding.    Personal Factors and  Comorbidities Comorbidity 3+    Comorbidities hx of cellulitis, morbid obesity and DM    Examination-Activity Limitations Bed Mobility;Carry;Dressing;Hygiene/Grooming;Locomotion Level;Stand;Stairs;Toileting    Examination-Participation Restrictions Cleaning;Church;Laundry;Driving;Community Activity;Meal Prep;Shop    Stability/Clinical Decision Making Evolving/Moderate complexity    Rehab Potential Good    PT Frequency 3x / week    PT Duration 8 weeks    PT Treatment/Interventions Patient/family education;Manual lymph drainage;Compression bandaging;Therapeutic exercise   wound care.   PT Next Visit Plan Continue with wound care and total decongestive techniques.  Measure weekly (wednesday)    PT Home Exercise Plan ankle pump, LAQ, hip ab/adduction, marching and diaphragmic breathing    Consulted and Agree with Plan of Care Patient           Patient will benefit from skilled therapeutic intervention in order to improve the following deficits and impairments:  Decreased activity tolerance, Decreased endurance, Decreased mobility, Difficulty walking, Decreased skin integrity, Obesity, Increased edema  Visit Diagnosis: Open wound of left lower leg, initial encounter  Open wound of right lower leg, initial encounter  Lymphedema, not elsewhere classified  Pain in left lower leg     Problem List Patient Active Problem List   Diagnosis Date Noted  . Cellulitis 04/27/2020  . AKI (acute kidney injury) ([place]) 04/27/2020  . Stasis dermatitis 04/27/2020  . Anxiety 03/31/2020  . Depression 03/31/2020  . GERD (gastroesophageal reflux disease) 03/31/2020  . Posttraumatic stress disorder 03/31/2020  . Personal history of noncompliance with medical treatment, presenting hazards to health 02/09/2018  . Abnormal CT of the abdomen 10/20/2017  . Dilated cbd, acquired 10/20/2017  . Essential hypertension, benign 01/14/2017  . Primary hypothyroidism 05/03/2015  . Mixed hyperlipidemia 05/03/2015    . Uncontrolled type 2 diabetes mellitus with complication, with long-term current use of insulin ([place]) 04/25/2015  . Morbid obesity due to excess calories ([place]) 04/25/2015  . Vitamin D deficiency 04/25/2015  . Cervical cancer ([place]) 10/05/2012   Kymberli Wiegand B  Mare Ferrari, PTA/CLT [phone]  [name] Irani 05/29/2020, 5:10 PM  [place], [place], [address] Phone: [phone]   Fax:  [phone]  Name: Michelle Skinner MRN: [id number] Date of Birth: [birth year]

## 2020-05-30 ENCOUNTER — Encounter (HOSPITAL_COMMUNITY): Payer: Self-pay | Admitting: Physical Therapy

## 2020-05-30 NOTE — Therapy (Unsigned)
[place], [place], [address] Phone: [phone]   Fax:  [phone]  Patient Details  Name: Michelle Skinner MRN: [id number] Date of Birth: 22-Oct-1965 Referring Provider:  No ref. provider found  Encounter Date: 05/30/2020   Therapist sent order for pump, garment and night garment to [facility] [place], PT CLT [phone] 05/30/2020, 9:05 AM  [place], [place], [address] Phone: [phone]   Fax:  [phone]

## 2020-05-31 ENCOUNTER — Ambulatory Visit (HOSPITAL_COMMUNITY): Payer: Medicare Other | Admitting: Physical Therapy

## 2020-06-04 DIAGNOSIS — Z882 Allergy status to sulfonamides status: Secondary | ICD-10-CM | POA: Diagnosis not present

## 2020-06-04 DIAGNOSIS — K529 Noninfective gastroenteritis and colitis, unspecified: Secondary | ICD-10-CM | POA: Diagnosis not present

## 2020-06-04 DIAGNOSIS — K5790 Diverticulosis of intestine, part unspecified, without perforation or abscess without bleeding: Secondary | ICD-10-CM | POA: Diagnosis not present

## 2020-06-04 DIAGNOSIS — K3184 Gastroparesis: Secondary | ICD-10-CM | POA: Diagnosis not present

## 2020-06-04 DIAGNOSIS — K5730 Diverticulosis of large intestine without perforation or abscess without bleeding: Secondary | ICD-10-CM | POA: Diagnosis not present

## 2020-06-04 DIAGNOSIS — I10 Essential (primary) hypertension: Secondary | ICD-10-CM | POA: Diagnosis not present

## 2020-06-04 DIAGNOSIS — R1084 Generalized abdominal pain: Secondary | ICD-10-CM | POA: Diagnosis not present

## 2020-06-04 DIAGNOSIS — E1143 Type 2 diabetes mellitus with diabetic autonomic (poly)neuropathy: Secondary | ICD-10-CM | POA: Diagnosis not present

## 2020-06-04 DIAGNOSIS — R112 Nausea with vomiting, unspecified: Secondary | ICD-10-CM | POA: Diagnosis not present

## 2020-06-05 ENCOUNTER — Telehealth (HOSPITAL_COMMUNITY): Payer: Self-pay | Admitting: Physical Therapy

## 2020-06-05 ENCOUNTER — Ambulatory Visit (HOSPITAL_COMMUNITY): Payer: Medicare Other | Admitting: Physical Therapy

## 2020-06-05 DIAGNOSIS — E119 Type 2 diabetes mellitus without complications: Secondary | ICD-10-CM | POA: Diagnosis not present

## 2020-06-05 DIAGNOSIS — Z794 Long term (current) use of insulin: Secondary | ICD-10-CM | POA: Diagnosis not present

## 2020-06-05 NOTE — Telephone Encounter (Signed)
pt called to cx this appt due to she was in the hospital last night and was released late last night

## 2020-06-06 ENCOUNTER — Encounter (HOSPITAL_BASED_OUTPATIENT_CLINIC_OR_DEPARTMENT_OTHER): Payer: Medicare Other | Admitting: Internal Medicine

## 2020-06-07 ENCOUNTER — Telehealth (HOSPITAL_COMMUNITY): Payer: Self-pay

## 2020-06-07 ENCOUNTER — Ambulatory Visit (HOSPITAL_COMMUNITY): Payer: Medicare Other

## 2020-06-07 ENCOUNTER — Ambulatory Visit: Payer: Medicare Other | Admitting: "Endocrinology

## 2020-06-07 NOTE — Telephone Encounter (Signed)
pt called to cx this appt due to she is still feeling sick

## 2020-06-09 ENCOUNTER — Other Ambulatory Visit: Payer: Self-pay

## 2020-06-09 ENCOUNTER — Encounter (HOSPITAL_COMMUNITY): Payer: Self-pay

## 2020-06-09 ENCOUNTER — Ambulatory Visit (HOSPITAL_COMMUNITY): Payer: Medicare Other | Attending: Podiatry

## 2020-06-09 DIAGNOSIS — S81802A Unspecified open wound, left lower leg, initial encounter: Secondary | ICD-10-CM | POA: Insufficient documentation

## 2020-06-09 DIAGNOSIS — S81801A Unspecified open wound, right lower leg, initial encounter: Secondary | ICD-10-CM | POA: Insufficient documentation

## 2020-06-09 DIAGNOSIS — M79662 Pain in left lower leg: Secondary | ICD-10-CM | POA: Insufficient documentation

## 2020-06-09 DIAGNOSIS — I890 Lymphedema, not elsewhere classified: Secondary | ICD-10-CM | POA: Diagnosis not present

## 2020-06-09 NOTE — Therapy (Signed)
[place], [place], [address] Phone: [phone]   Fax:  [phone]  Physical Therapy Treatment  Patient Details  Name: Michelle Skinner MRN: [id number] Date of Birth: 1965/10/26 Referring Provider (PT): Boneta Lucks    Encounter Date: 06/09/2020   PT End of Session - 06/09/20 1310    Visit Number 7    Number of Visits 24    Date for PT Re-Evaluation 07/10/20    Authorization Type UHC medicare but is at [facility] skilled nursing at this time    Progress Note Due on Visit 10    PT Start Time 1050    PT Stop Time 1223    PT Time Calculation (min) 93 min    Activity Tolerance Patient tolerated treatment well    Behavior During Therapy [facility] for tasks assessed/performed           Past Medical History:  Diagnosis Date  . Anemia   . Cervical cancer ([place])   . Depression   . Diabetes mellitus, type II ([place])   . GERD (gastroesophageal reflux disease)   . Hyperlipidemia   . Neuropathy   . PTSD (post-traumatic stress disorder)     Past Surgical History:  Procedure Laterality Date  . ABDOMINAL HYSTERECTOMY    . COLONOSCOPY WITH PROPOFOL N/A 05/25/2018   Procedure: COLONOSCOPY WITH PROPOFOL;  Surgeon: Daneil Dolin, MD;  [facility];  Service: Endoscopy;  Laterality: N/A;  7:30am  . OTHER SURGICAL HISTORY Right    R Foot- I&D  . POLYPECTOMY  05/25/2018   Procedure: POLYPECTOMY;  Surgeon: Daneil Dolin, MD;  [facility];  Service: Endoscopy;;  colon  . UMBILICAL HERNIA REPAIR  2007    There were no vitals filed for this visit.   Subjective Assessment - 06/09/20 1301    Subjective Pt stated her gastroparesis symptoms have lasted for 11 days now, 2nd day with nausea or diarhea.  Has not contacted the podiatrist yet.  No reports of weaping LE or pain.    Pertinent History DM, morbid obesity, cellulitis    Currently in Pain? No/denies                 LYMPHEDEMA/ONCOLOGY  QUESTIONNAIRE - 06/09/20 0001      Right Lower Extremity Lymphedema   20 cm Proximal to Suprapatella 86.3 cm   was on 11/8: 90   10 cm Proximal to Suprapatella 76.3 cm   was on 11/8:    At Midpatella/Popliteal Crease 57 cm   was on 11/8:    30 cm Proximal to Floor at Lateral Plantar Foot 59.5 cm   was on 11/8:    20 cm Proximal to Floor at Lateral Plantar Foot 58 1   was on 11/8:    10 cm Proximal to Floor at Lateral [place]oli 46 cm   was on 11/8:    Circumference of ankle/heel 38.5 cm.   was on 11/8:    5 cm Proximal to 1st MTP Joint 29.5 cm   was on 11/8:    Across MTP Joint 26.5 cm   was on 11/8:    Around Proximal Great Toe 10.3 cm   was on 11/8:      Left Lower Extremity Lymphedema   20 cm Proximal to Suprapatella 83.6 cm   was on 11/8:    10 cm Proximal to Suprapatella 86 cm   was on 11/8:    At [place]  cm   was on 11/8:    30 cm Proximal to Floor at Lateral Plantar Foot 63.3 cm   was on 11/8:    20 cm Proximal to Floor at Lateral Plantar Foot 63.8 cm   was on 11/8:    10 cm Proximal to Floor at Lateral [place]oli 52.3 cm   was on 11/8:    Circumference of ankle/heel 36.6 cm.   was on 11/8:    5 cm Proximal to 1st MTP Joint 28.2 cm   was on 11/8:    Across MTP Joint 26.5 cm   was on 11/8:    Around Proximal Great Toe 11.5 cm   was on 11/8:                      [facility] Adult PT Treatment/Exercise - 06/09/20 0001      Manual Therapy   Manual Therapy Manual Lymphatic Drainage (MLD);Compression Bandaging;Other (comment)    Manual therapy comments Manual complete separate than rest of tx    Manual Lymphatic Drainage (MLD) Manual lymph decognitive technique anterior only today    Compression Bandaging Lt thigh high, Rt knee high multilayer shortstretch bandages with foam.      Other Manual Therapy Measurements complete; Cut foam for Lt thigh                    PT Short Term Goals - 05/15/20 1556      PT SHORT TERM GOAL #1   Title Pt  wounds to have healed B to reduce risk of reinfection    Time 4    Period Weeks    Status New    Target Date 06/12/20      PT SHORT TERM GOAL #2   Title PT to have lost 5 cm in circumference of LE at 30 and 20 cm to improve pt ability to lift leg for stair climbing.    Time 4    Period Weeks    Status New      PT SHORT TERM GOAL #3   Title PT to be completing her HEP on a daily basis    Time 4    Period Weeks    Status New             PT Long Term Goals - 05/15/20 1558      PT LONG TERM GOAL #1   Title PT to have lost 10 cm of fluid from 30 and 20 cm to allow pt to be able to go up 3 flights of steps to her apartment.    Time 8    Period Weeks    Status New    Target Date 07/10/20      PT LONG TERM GOAL #2   Title PT to have and be able to don compression garments    Time 8    Period Weeks    Status New      PT LONG TERM GOAL #3   Title PT to have and be using compression pump    Time 8    Period Weeks                 Plan - 06/09/20 1311    Clinical Impression Statement Measurements complete and 1/2in foam cut for Lt thigh.  Manual decongestive lymphedema techniques complete to anterior only due to time restristraints.  Reviewed self manual with some cueng for directions.  No weaping or wounds present today  in posterior calf, dry skin present.  Moistured LE prior multilayer short stretch bandages with 1/2in foam.  Reports of comfort at EOS.    Personal Factors and Comorbidities Comorbidity 3+    Comorbidities hx of cellulitis, morbid obesity and DM    Examination-Activity Limitations Bed Mobility;Carry;Dressing;Hygiene/Grooming;Locomotion Level;Stand;Stairs;Toileting    Examination-Participation Restrictions Cleaning;Church;Laundry;Driving;Community Activity;Meal Prep;Shop    Stability/Clinical Decision Making Evolving/Moderate complexity    Clinical Decision Making Moderate    Rehab Potential Good    PT Frequency 3x / week    PT Duration 8 weeks    PT  Treatment/Interventions Patient/family education;Manual lymph drainage;Compression bandaging;Therapeutic exercise   wound care   PT Next Visit Plan Continue with wound care and total decongestive techniques.  Measure weekly (wednesday)    PT Home Exercise Plan ankle pump, LAQ, hip ab/adduction, marching and diaphragmic breathing           Patient will benefit from skilled therapeutic intervention in order to improve the following deficits and impairments:  Decreased activity tolerance, Decreased endurance, Decreased mobility, Difficulty walking, Decreased skin integrity, Obesity, Increased edema  Visit Diagnosis: Pain in left lower leg  Lymphedema, not elsewhere classified     Problem List Patient Active Problem List   Diagnosis Date Noted  . Cellulitis 04/27/2020  . AKI (acute kidney injury) ([place]) 04/27/2020  . Stasis dermatitis 04/27/2020  . Anxiety 03/31/2020  . Depression 03/31/2020  . GERD (gastroesophageal reflux disease) 03/31/2020  . Posttraumatic stress disorder 03/31/2020  . Personal history of noncompliance with medical treatment, presenting hazards to health 02/09/2018  . Abnormal CT of the abdomen 10/20/2017  . Dilated cbd, acquired 10/20/2017  . Essential hypertension, benign 01/14/2017  . Primary hypothyroidism 05/03/2015  . Mixed hyperlipidemia 05/03/2015  . Uncontrolled type 2 diabetes mellitus with complication, with long-term current use of insulin ([place]) 04/25/2015  . Morbid obesity due to excess calories ([place]) 04/25/2015  . Vitamin D deficiency 04/25/2015  . Cervical cancer ([place]) 10/05/2012   Ihor Austin, LPTA/CLT; CBIS [phone]  Aldona Lento 06/09/2020, 1:14 PM  [place] [address] [place], [place], [address] Phone: [phone]   Fax:  [phone]  Name: Michelle Skinner MRN: [id number] Date of Birth: 1966-06-29

## 2020-06-12 ENCOUNTER — Ambulatory Visit (HOSPITAL_COMMUNITY): Payer: Medicare Other | Admitting: Physical Therapy

## 2020-06-12 ENCOUNTER — Other Ambulatory Visit: Payer: Self-pay

## 2020-06-12 DIAGNOSIS — S81801A Unspecified open wound, right lower leg, initial encounter: Secondary | ICD-10-CM

## 2020-06-12 DIAGNOSIS — E119 Type 2 diabetes mellitus without complications: Secondary | ICD-10-CM | POA: Diagnosis not present

## 2020-06-12 DIAGNOSIS — I890 Lymphedema, not elsewhere classified: Secondary | ICD-10-CM

## 2020-06-12 DIAGNOSIS — Z794 Long term (current) use of insulin: Secondary | ICD-10-CM | POA: Diagnosis not present

## 2020-06-12 DIAGNOSIS — M79662 Pain in left lower leg: Secondary | ICD-10-CM | POA: Diagnosis not present

## 2020-06-12 DIAGNOSIS — S81802A Unspecified open wound, left lower leg, initial encounter: Secondary | ICD-10-CM | POA: Diagnosis not present

## 2020-06-12 NOTE — Therapy (Signed)
[place], [place], [address] Phone: [phone]   Fax:  [phone]  Physical Therapy Treatment  Patient Details  Name: Michelle Skinner MRN: [id number] Date of Birth: 11/26/1965 Referring Provider (PT): Boneta Lucks    Encounter Date: 06/12/2020   PT End of Session - 06/12/20 1758    Visit Number 8    Number of Visits 24    Date for PT Re-Evaluation 07/10/20    Authorization Type UHC medicare but is at [facility] skilled nursing at this time    Progress Note Due on Visit 10    PT Start Time 1055    PT Stop Time 1220    PT Time Calculation (min) 85 min    Activity Tolerance Patient tolerated treatment well    Behavior During Therapy [facility] for tasks assessed/performed           Past Medical History:  Diagnosis Date  . Anemia   . Cervical cancer ([place])   . Depression   . Diabetes mellitus, type II ([place])   . GERD (gastroesophageal reflux disease)   . Hyperlipidemia   . Neuropathy   . PTSD (post-traumatic stress disorder)     Past Surgical History:  Procedure Laterality Date  . ABDOMINAL HYSTERECTOMY    . COLONOSCOPY WITH PROPOFOL N/A 05/25/2018   Procedure: COLONOSCOPY WITH PROPOFOL;  Surgeon: Daneil Dolin, MD;  [facility];  Service: Endoscopy;  Laterality: N/A;  7:30am  . OTHER SURGICAL HISTORY Right    R Foot- I&D  . POLYPECTOMY  05/25/2018   Procedure: POLYPECTOMY;  Surgeon: Daneil Dolin, MD;  Location[facility];  Service: Endoscopy;;  colon  . UMBILICAL HERNIA REPAIR  2007    There were no vitals filed for this visit.   Subjective Assessment - 06/12/20 1757    Subjective pt states she just removed her bandages last night.  No issues.    Currently in Pain? No/denies                             [facility] Adult PT Treatment/Exercise - 06/12/20 0001      Manual Therapy   Manual Therapy Manual Lymphatic Drainage (MLD);Compression Bandaging;Other (comment)     Manual therapy comments Manual complete separate than rest of tx    Manual Lymphatic Drainage (MLD) Manual lymph decognitive technique anterior only today    Compression Bandaging Lt thigh high, Rt knee high multilayer shortstretch bandages with foam.                    PT Education - 06/12/20 1759    Education Details importance of taking control of her health needs    Person(s) Educated Patient    Methods Explanation    Comprehension Verbalized understanding            PT Short Term Goals - 05/15/20 1556      PT SHORT TERM GOAL #1   Title Pt wounds to have healed B to reduce risk of reinfection    Time 4    Period Weeks    Status New    Target Date 06/12/20      PT SHORT TERM GOAL #2   Title PT to have lost 5 cm in circumference of LE at 30 and 20 cm to improve pt ability to lift leg for stair climbing.    Time 4    Period  Weeks    Status New      PT SHORT TERM GOAL #3   Title PT to be completing her HEP on a daily basis    Time 4    Period Weeks    Status New             PT Long Term Goals - 05/15/20 1558      PT LONG TERM GOAL #1   Title PT to have lost 10 cm of fluid from 30 and 20 cm to allow pt to be able to go up 3 flights of steps to her apartment.    Time 8    Period Weeks    Status New    Target Date 07/10/20      PT LONG TERM GOAL #2   Title PT to have and be able to don compression garments    Time 8    Period Weeks    Status New      PT LONG TERM GOAL #3   Title PT to have and be using compression pump    Time 8    Period Weeks                 Plan - 06/12/20 1759    Clinical Impression Statement Pt returns with noted increase in edema in dorsal feet and distal LE's.  Also noted soiled feet and socks.  Discussed cleanliness and need to wash short stretch as well as it is also soiled.  Pt has failed to contact podiatrist in addition and discussed the importance of doing this as well.  Manual lymph drainage completed to bil  LE's with full compression to Lt and knee high to right.    Personal Factors and Comorbidities Comorbidity 3+    Comorbidities hx of cellulitis, morbid obesity and DM    Examination-Activity Limitations Bed Mobility;Carry;Dressing;Hygiene/Grooming;Locomotion Level;Stand;Stairs;Toileting    Examination-Participation Restrictions Cleaning;Church;Laundry;Driving;Community Activity;Meal Prep;Shop    Stability/Clinical Decision Making Evolving/Moderate complexity    Rehab Potential Good    PT Frequency 3x / week    PT Duration 8 weeks    PT Treatment/Interventions Patient/family education;Manual lymph drainage;Compression bandaging;Therapeutic exercise   wound care   PT Next Visit Plan Continue with wound care and total decongestive techniques.  Measure weekly (wednesday)    PT Home Exercise Plan ankle pump, LAQ, hip ab/adduction, marching and diaphragmic breathing           Patient will benefit from skilled therapeutic intervention in order to improve the following deficits and impairments:  Decreased activity tolerance, Decreased endurance, Decreased mobility, Difficulty walking, Decreased skin integrity, Obesity, Increased edema  Visit Diagnosis: Lymphedema, not elsewhere classified  Pain in left lower leg  Open wound of left lower leg, initial encounter  Open wound of right lower leg, initial encounter     Problem List Patient Active Problem List   Diagnosis Date Noted  . Cellulitis 04/27/2020  . AKI (acute kidney injury) ([place]) 04/27/2020  . Stasis dermatitis 04/27/2020  . Anxiety 03/31/2020  . Depression 03/31/2020  . GERD (gastroesophageal reflux disease) 03/31/2020  . Posttraumatic stress disorder 03/31/2020  . Personal history of noncompliance with medical treatment, presenting hazards to health 02/09/2018  . Abnormal CT of the abdomen 10/20/2017  . Dilated cbd, acquired 10/20/2017  . Essential hypertension, benign 01/14/2017  . Primary hypothyroidism 05/03/2015  .  Mixed hyperlipidemia 05/03/2015  . Uncontrolled type 2 diabetes mellitus with complication, with long-term current use of insulin ([place]) 04/25/2015  . Morbid obesity  due to excess calories ([place]) 04/25/2015  . Vitamin D deficiency 04/25/2015  . Cervical cancer ([place]) 10/05/2012   [name] Irani, PTA/CLT [phone]  [name] Irani 06/12/2020, 6:00 PM  [place] [address] [place], [place], [address] Phone: [phone]   Fax:  [phone]  Name: Michelle Skinner MRN: [id number] Date of Birth: 10/09/1965

## 2020-06-14 ENCOUNTER — Telehealth (HOSPITAL_COMMUNITY): Payer: Self-pay | Admitting: Physical Therapy

## 2020-06-14 ENCOUNTER — Ambulatory Visit (HOSPITAL_COMMUNITY): Payer: Medicare Other | Admitting: Physical Therapy

## 2020-06-14 NOTE — Telephone Encounter (Signed)
pt called to cx today's appt still not feeling to good

## 2020-06-14 NOTE — Telephone Encounter (Signed)
Pt cx on phone tree called to confirm no answer- mail box was full could not leave a message

## 2020-06-16 ENCOUNTER — Telehealth (HOSPITAL_COMMUNITY): Payer: Self-pay | Admitting: Physical Therapy

## 2020-06-16 ENCOUNTER — Ambulatory Visit (HOSPITAL_COMMUNITY): Payer: Medicare Other | Admitting: Physical Therapy

## 2020-06-16 NOTE — Telephone Encounter (Signed)
pt called to cx today's appt due to no transportation

## 2020-06-19 ENCOUNTER — Ambulatory Visit (HOSPITAL_COMMUNITY): Payer: Medicare Other | Admitting: Physical Therapy

## 2020-06-19 ENCOUNTER — Other Ambulatory Visit: Payer: Self-pay

## 2020-06-19 DIAGNOSIS — S81801A Unspecified open wound, right lower leg, initial encounter: Secondary | ICD-10-CM | POA: Diagnosis not present

## 2020-06-19 DIAGNOSIS — M79662 Pain in left lower leg: Secondary | ICD-10-CM | POA: Diagnosis not present

## 2020-06-19 DIAGNOSIS — S81802A Unspecified open wound, left lower leg, initial encounter: Secondary | ICD-10-CM | POA: Diagnosis not present

## 2020-06-19 DIAGNOSIS — I890 Lymphedema, not elsewhere classified: Secondary | ICD-10-CM | POA: Diagnosis not present

## 2020-06-19 NOTE — Therapy (Signed)
[place], [place], [address] Phone: [phone]   Fax:  [phone]  Physical Therapy Treatment  Patient Details  Name: Michelle Skinner MRN: [id number] Date of Birth: March 26, 1966 Referring Provider (PT): Boneta Lucks    Encounter Date: 06/19/2020   PT End of Session - 06/19/20 1242    Visit Number 9    Number of Visits 24    Date for PT Re-Evaluation 07/10/20    Authorization Type UHC medicare but is at [facility] skilled nursing at this time    Progress Note Due on Visit 10    PT Start Time 1025    PT Stop Time 1150    PT Time Calculation (min) 85 min    Activity Tolerance Patient tolerated treatment well    Behavior During Therapy [facility] for tasks assessed/performed           Past Medical History:  Diagnosis Date  . Anemia   . Cervical cancer ([place])   . Depression   . Diabetes mellitus, type II ([place])   . GERD (gastroesophageal reflux disease)   . Hyperlipidemia   . Neuropathy   . PTSD (post-traumatic stress disorder)     Past Surgical History:  Procedure Laterality Date  . ABDOMINAL HYSTERECTOMY    . COLONOSCOPY WITH PROPOFOL N/A 05/25/2018   Procedure: COLONOSCOPY WITH PROPOFOL;  Surgeon: Daneil Dolin, MD;  [facility];  Service: Endoscopy;  Laterality: N/A;  7:30am  . OTHER SURGICAL HISTORY Right    R Foot- I&D  . POLYPECTOMY  05/25/2018   Procedure: POLYPECTOMY;  Surgeon: Daneil Dolin, MD;  [facility];  Service: Endoscopy;;  colon  . UMBILICAL HERNIA REPAIR  2007    There were no vitals filed for this visit.   Subjective Assessment - 06/19/20 1238    Subjective Pt states she removed her bandages thursday night and then was unable to make it Friday due to transportation.  STates she has a podiatrist appointment on 12/20.  States she can tell her swelling is up today because her legs have been achey.    Currently in Pain? No/denies                  LYMPHEDEMA/ONCOLOGY QUESTIONNAIRE - 06/19/20 1240      Right Lower Extremity Lymphedema   20 cm Proximal to Suprapatella 84 cm   was on 11/8: 90   10 cm Proximal to Suprapatella 73 cm   was on 11/8:    At Midpatella/Popliteal Crease 54 cm   was on 11/8:    30 cm Proximal to Floor at Lateral Plantar Foot 62 cm   was on 11/8:    20 cm Proximal to Floor at Lateral Plantar Foot 59 1   was on 11/8:    10 cm Proximal to Floor at Lateral Malleoli 47 cm   was on 11/8:    Circumference of ankle/heel 40 cm.   was on 11/8:    5 cm Proximal to 1st MTP Joint 31 cm   was on 11/8:    Across MTP Joint 28.5 cm   was on 11/8:    Around Proximal Great Toe 12.5 cm   was on 11/8:      Left Lower Extremity Lymphedema   20 cm Proximal to Suprapatella 84.5 cm   was on 11/8:    10 cm Proximal to Suprapatella 76 cm   was on 11/8:    At  Midpatella/Popliteal Crease 56 cm   was on 11/8:    30 cm Proximal to Floor at Lateral Plantar Foot 68.4 cm   was on 11/8:    20 cm Proximal to Floor at Lateral Plantar Foot 64.8 cm   was on 11/8:    10 cm Proximal to Floor at Lateral Malleoli 47 cm   was on 11/8:    Circumference of ankle/heel 39 cm.   was on 11/8:    5 cm Proximal to 1st MTP Joint 30.4 cm   was on 11/8:    Across MTP Joint 28.8 cm   was on 11/8:    Around Proximal Great Toe 12.5 cm   was on 11/8:                      [facility] Adult PT Treatment/Exercise - 06/19/20 0001      Manual Therapy   Manual Therapy Manual Lymphatic Drainage (MLD);Compression Bandaging;Other (comment)    Manual therapy comments Manual complete separate than rest of tx    Manual Lymphatic Drainage (MLD) Manual lymph decognitive technique anterior only today    Compression Bandaging Lt thigh high, Rt knee high multilayer shortstretch bandages with foam.      Other Manual Therapy Measurements complete                    PT Short Term Goals - 05/15/20 1556      PT SHORT TERM GOAL #1   Title Pt wounds to have  healed B to reduce risk of reinfection    Time 4    Period Weeks    Status New    Target Date 06/12/20      PT SHORT TERM GOAL #2   Title PT to have lost 5 cm in circumference of LE at 30 and 20 cm to improve pt ability to lift leg for stair climbing.    Time 4    Period Weeks    Status New      PT SHORT TERM GOAL #3   Title PT to be completing her HEP on a daily basis    Time 4    Period Weeks    Status New             PT Long Term Goals - 05/15/20 1558      PT LONG TERM GOAL #1   Title PT to have lost 10 cm of fluid from 30 and 20 cm to allow pt to be able to go up 3 flights of steps to her apartment.    Time 8    Period Weeks    Status New    Target Date 07/10/20      PT LONG TERM GOAL #2   Title PT to have and be able to don compression garments    Time 8    Period Weeks    Status New      PT LONG TERM GOAL #3   Title PT to have and be using compression pump    Time 8    Period Weeks                 Plan - 06/19/20 1242    Clinical Impression Statement Pt with increased edema today in her distal bil LE's according to measurements and also with increased induration/posterior redness. Thighs, however have reduced compared to measurements taken 10 days ago.   Manual completed as well as heavy moisturizer to  bil LE's.  Educated on importance of regular compliance to make progress and encouarged to set up transportation through [facility] to ensure she can get here.  Pt weighted today as well at 378#.  Pt reported overall comfort with bandages.    Personal Factors and Comorbidities Comorbidity 3+    Comorbidities hx of cellulitis, morbid obesity and DM    Examination-Activity Limitations Bed Mobility;Carry;Dressing;Hygiene/Grooming;Locomotion Level;Stand;Stairs;Toileting    Examination-Participation Restrictions Cleaning;Church;Laundry;Driving;Community Activity;Meal Prep;Shop    Stability/Clinical Decision Making Evolving/Moderate complexity    Rehab Potential  Good    PT Frequency 3x / week    PT Duration 8 weeks    PT Treatment/Interventions Patient/family education;Manual lymph drainage;Compression bandaging;Therapeutic exercise   wound care   PT Next Visit Plan Continue with wound care and total decongestive techniques.  Measure weekly (wednesday).   Complete 10th visit PN next session.    PT Home Exercise Plan ankle pump, LAQ, hip ab/adduction, marching and diaphragmic breathing           Patient will benefit from skilled therapeutic intervention in order to improve the following deficits and impairments:  Decreased activity tolerance,Decreased endurance,Decreased mobility,Difficulty walking,Decreased skin integrity,Obesity,Increased edema  Visit Diagnosis: Pain in left lower leg  Lymphedema, not elsewhere classified     Problem List Patient Active Problem List   Diagnosis Date Noted  . Cellulitis 04/27/2020  . AKI (acute kidney injury) ([place]) 04/27/2020  . Stasis dermatitis 04/27/2020  . Anxiety 03/31/2020  . Depression 03/31/2020  . GERD (gastroesophageal reflux disease) 03/31/2020  . Posttraumatic stress disorder 03/31/2020  . Personal history of noncompliance with medical treatment, presenting hazards to health 02/09/2018  . Abnormal CT of the abdomen 10/20/2017  . Dilated cbd, acquired 10/20/2017  . Essential hypertension, benign 01/14/2017  . Primary hypothyroidism 05/03/2015  . Mixed hyperlipidemia 05/03/2015  . Uncontrolled type 2 diabetes mellitus with complication, with long-term current use of insulin ([place]) 04/25/2015  . Morbid obesity due to excess calories ([place]) 04/25/2015  . Vitamin D deficiency 04/25/2015  . Cervical cancer ([place]) 10/05/2012   [name] Irani, PTA/CLT [phone]  [name] Irani 06/19/2020, 12:46 PM  [place] [address] [place], [place], [address] Phone: [phone]   Fax:  [phone]  Name: Michelle Skinner MRN: [id number] Date of Birth:  [birth year]

## 2020-06-21 ENCOUNTER — Ambulatory Visit (HOSPITAL_COMMUNITY): Payer: Medicare Other | Admitting: Physical Therapy

## 2020-06-21 ENCOUNTER — Other Ambulatory Visit: Payer: Self-pay

## 2020-06-21 DIAGNOSIS — M79662 Pain in left lower leg: Secondary | ICD-10-CM

## 2020-06-21 DIAGNOSIS — S81801A Unspecified open wound, right lower leg, initial encounter: Secondary | ICD-10-CM

## 2020-06-21 DIAGNOSIS — I890 Lymphedema, not elsewhere classified: Secondary | ICD-10-CM | POA: Diagnosis not present

## 2020-06-21 DIAGNOSIS — S81802A Unspecified open wound, left lower leg, initial encounter: Secondary | ICD-10-CM | POA: Diagnosis not present

## 2020-06-21 NOTE — Therapy (Signed)
[facility] [address] [place], [place], [address] Phone: [phone]   Fax:  [phone]  Physical Therapy Treatment Progress Note Reporting Period 05/15/2020 to 06/21/2020  See note below for Objective Data and Assessment of Progress/Goals.      Patient Details  Name: Michelle Skinner MRN: [id number] Date of Birth: Apr 05, 1966 Referring Provider (PT): Boneta Lucks    Encounter Date: 06/21/2020   PT End of Session - 06/21/20 1411    Visit Number 10    Number of Visits 24    Date for PT Re-Evaluation 07/10/20    Authorization Type UHC medicare but is at [facility] skilled nursing at this time    Progress Note Due on Visit 20    PT Start Time 1055    PT Stop Time 1210    PT Time Calculation (min) 75 min    Activity Tolerance Patient tolerated treatment well    Behavior During Therapy [facility] for tasks assessed/performed           Past Medical History:  Diagnosis Date  . Anemia   . Cervical cancer ([place])   . Depression   . Diabetes mellitus, type II ([place])   . GERD (gastroesophageal reflux disease)   . Hyperlipidemia   . Neuropathy   . PTSD (post-traumatic stress disorder)     Past Surgical History:  Procedure Laterality Date  . ABDOMINAL HYSTERECTOMY    . COLONOSCOPY WITH PROPOFOL N/A 05/25/2018   Procedure: COLONOSCOPY WITH PROPOFOL;  Surgeon: Daneil Dolin, MD;  [facility];  Service: Endoscopy;  Laterality: N/A;  7:30am  . OTHER SURGICAL HISTORY Right    R Foot- I&D  . POLYPECTOMY  05/25/2018   Procedure: POLYPECTOMY;  Surgeon: Daneil Dolin, MD;  [facility];  Service: Endoscopy;;  colon  . UMBILICAL HERNIA REPAIR  2007    There were no vitals filed for this visit.   Subjective Assessment - 06/21/20 1407    Subjective pt states she removed her bandages yesterday. States the foot bandages tend to come off on the Lt foot.  STates she still has not heard from the the pump rep and has not  found any shoes that will fit over her bandages.                 LYMPHEDEMA/ONCOLOGY QUESTIONNAIRE - 06/21/20 1410      Right Lower Extremity Lymphedema   20 cm Proximal to Suprapatella 84 cm   was 90   10 cm Proximal to Suprapatella 73 cm   was 81.4   At Midpatella/Popliteal Crease 54 cm   was 55.3   30 cm Proximal to Floor at Lateral Plantar Foot 62 cm   was 64   20 cm Proximal to Floor at Lateral Plantar Foot 59 1   was 61.8   10 cm Proximal to Floor at Lateral Malleoli 47 cm   was 45.3   Circumference of ankle/heel 40 cm.   was 41.2   5 cm Proximal to 1st MTP Joint 31 cm   was 29.8   Across MTP Joint 28.5 cm   was 29.4   Around Proximal Great Toe 12.5 cm   was 12.5     Left Lower Extremity Lymphedema   20 cm Proximal to Suprapatella 84.5 cm   was 83.4   10 cm Proximal to Suprapatella 76 cm   was 87.6   At Midpatella/Popliteal Crease 56 cm   was 58.4  30 cm Proximal to Floor at Lateral Plantar Foot 68.4 cm   was 77.5   20 cm Proximal to Floor at Lateral Plantar Foot 64.8 cm   was 70.9   10 cm Proximal to Floor at Lateral Malleoli 47 cm   was 53   Circumference of ankle/heel 39 cm.   was 40.5   5 cm Proximal to 1st MTP Joint 30.4 cm   was 29.7   Across MTP Joint 28.8 cm   was 29.2   Around Proximal Great Toe 12.5 cm   was 13.5                             PT Education - 06/21/20 1409    Education Details compliance with program.  Importance of cleaning socks daily and bandages once weekly.    Person(s) Educated Patient    Methods Explanation    Comprehension Verbalized understanding            PT Short Term Goals - 06/21/20 1446      PT SHORT TERM GOAL #1   Title Pt wounds to have healed B to reduce risk of reinfection    Time 4    Period Weeks    Status Achieved    Target Date 06/12/20      PT SHORT TERM GOAL #2   Title PT to have lost 5 cm in circumference of LE at 30 and 20 cm to improve pt ability to lift leg for stair climbing.     Time 4    Period Weeks    Status On-going      PT SHORT TERM GOAL #3   Title PT to be completing her HEP on a daily basis    Time 4    Period Weeks    Status On-going             PT Long Term Goals - 06/21/20 1447      PT LONG TERM GOAL #1   Title PT to have lost 10 cm of fluid from 30 and 20 cm to allow pt to be able to go up 3 flights of steps to her apartment.    Time 8    Period Weeks    Status On-going      PT LONG TERM GOAL #2   Title PT to have and be able to don compression garments    Time 8    Period Weeks    Status On-going      PT LONG TERM GOAL #3   Title PT to have and be using compression pump    Time 8    Period Weeks    Status On-going                 Plan - 06/21/20 1445    Clinical Impression Statement Progress note completed using measurements from last appt on 12/13.  Pt with increased edema  in her distal bil LE's according to measurements compared to last week's measurements, however overall reduced compared to initial evaluation completed on 11/08.  Counseled patient regarding importance of maintaining compliance for best results.  Pt has had transportation issues and gastroparesis flare ups that have prohibited her from keeping all her scheduled appointments.  Pt does have an appointment with a podiatrist on Monday 12/20 to clean up the callouses on her great toes.  Manual completed as well as heavy moisturizer to bil LE's.  Educated  on importance of regular compliance to make progress and encouarged to set up transportation through [facility] to ensure she can get here.  Pt weighed today at 378#. Pt will continue to benefit from complete lymphatic therapy to reduce LE's to transition to garments.  Therapist to follow up with pump and send off orders for pump, compression garments and night garments to [facility].    Personal Factors and Comorbidities Comorbidity 3+    Comorbidities hx of cellulitis, morbid obesity and DM    Examination-Activity  Limitations Bed Mobility;Carry;Dressing;Hygiene/Grooming;Locomotion Level;Stand;Stairs;Toileting    Examination-Participation Restrictions Cleaning;Church;Laundry;Driving;Community Activity;Meal Prep;Shop    Stability/Clinical Decision Making Evolving/Moderate complexity    Rehab Potential Good    PT Frequency 3x / week    PT Duration 8 weeks    PT Treatment/Interventions Patient/family education;Manual lymph drainage;Compression bandaging;Therapeutic exercise   wound care   PT Next Visit Plan Continue with wound care and total decongestive techniques.  Measure weekly (wednesday).   F/U with communication from [facility] regarding pump and garments.    PT Home Exercise Plan ankle pump, LAQ, hip ab/adduction, marching and diaphragmic breathing           Patient will benefit from skilled therapeutic intervention in order to improve the following deficits and impairments:  Decreased activity tolerance,Decreased endurance,Decreased mobility,Difficulty walking,Decreased skin integrity,Obesity,Increased edema  Visit Diagnosis: Pain in left lower leg  Lymphedema, not elsewhere classified  Open wound of left lower leg, initial encounter  Open wound of right lower leg, initial encounter     Problem List Patient Active Problem List   Diagnosis Date Noted  . Cellulitis 04/27/2020  . AKI (acute kidney injury) ([place]) 04/27/2020  . Stasis dermatitis 04/27/2020  . Anxiety 03/31/2020  . Depression 03/31/2020  . GERD (gastroesophageal reflux disease) 03/31/2020  . Posttraumatic stress disorder 03/31/2020  . Personal history of noncompliance with medical treatment, presenting hazards to health 02/09/2018  . Abnormal CT of the abdomen 10/20/2017  . Dilated cbd, acquired 10/20/2017  . Essential hypertension, benign 01/14/2017  . Primary hypothyroidism 05/03/2015  . Mixed hyperlipidemia 05/03/2015  . Uncontrolled type 2 diabetes mellitus with complication, with long-term current use of insulin  ([place]) 04/25/2015  . Morbid obesity due to excess calories ([place]) 04/25/2015  . Vitamin D deficiency 04/25/2015  . Cervical cancer ([place]) 10/05/2012    [name] Irani, PTA/CLT [phone]  [name] Irani 06/21/2020, 2:48 PM  [facility] [address] [place], [place], [address] Phone: [phone]   Fax:  [phone]  Name: Michelle Skinner MRN: [id number] Date of Birth: Aug 03, 1965

## 2020-06-23 ENCOUNTER — Encounter (HOSPITAL_COMMUNITY): Payer: Self-pay

## 2020-06-23 ENCOUNTER — Ambulatory Visit (HOSPITAL_COMMUNITY): Payer: Medicare Other

## 2020-06-23 ENCOUNTER — Other Ambulatory Visit: Payer: Self-pay

## 2020-06-23 DIAGNOSIS — S81801A Unspecified open wound, right lower leg, initial encounter: Secondary | ICD-10-CM | POA: Diagnosis not present

## 2020-06-23 DIAGNOSIS — S81802A Unspecified open wound, left lower leg, initial encounter: Secondary | ICD-10-CM | POA: Diagnosis not present

## 2020-06-23 DIAGNOSIS — I890 Lymphedema, not elsewhere classified: Secondary | ICD-10-CM | POA: Diagnosis not present

## 2020-06-23 DIAGNOSIS — M79662 Pain in left lower leg: Secondary | ICD-10-CM

## 2020-06-23 NOTE — Therapy (Signed)
[facility] [place], [place], [address] Phone: [phone]   Fax:  [phone]  Physical Therapy Treatment  Patient Details  Name: Michelle Skinner MRN: [id number] Date of Birth: [birth year] Referring Provider (PT): Boneta Lucks    Encounter Date: 06/23/2020   PT End of Session - 06/23/20 1259    Visit Number 11    Number of Visits 24    Date for PT Re-Evaluation 07/10/20    Authorization Type UHC medicare but is at [facility] at this time    Progress Note Due on Visit 20    PT Start Time 1050    PT Stop Time 1225    PT Time Calculation (min) 95 min    Activity Tolerance Patient tolerated treatment well    Behavior During Therapy [facility] for tasks assessed/performed           Past Medical History:  Diagnosis Date  . Anemia   . Cervical cancer ([place])   . Depression   . Diabetes mellitus, type II ([place])   . GERD (gastroesophageal reflux disease)   . Hyperlipidemia   . Neuropathy   . PTSD (post-traumatic stress disorder)     Past Surgical History:  Procedure Laterality Date  . ABDOMINAL HYSTERECTOMY    . COLONOSCOPY WITH PROPOFOL N/A 05/25/2018   Procedure: COLONOSCOPY WITH PROPOFOL;  Surgeon: Daneil Dolin, MD;  [facility];  Service: Endoscopy;  Laterality: N/A;  7:30am  . OTHER SURGICAL HISTORY Right    R Foot- I&D  . POLYPECTOMY  05/25/2018   Procedure: POLYPECTOMY;  Surgeon: Daneil Dolin, MD;  Location: [facility];  Service: Endoscopy;;  colon  . UMBILICAL HERNIA REPAIR  2007    There were no vitals filed for this visit.   Subjective Assessment - 06/23/20 1255    Subjective Pt stated she has apt scheduled with poditrist, will ot be able to make it to apt Monday.  Still has not heard from the pump rep and unable to find any shoes the fit over her bandages.    Pertinent History DM, morbid obesity, cellulitis    Currently in Pain? No/denies                              [facility] Adult PT Treatment/Exercise - 06/23/20 0001      Manual Therapy   Manual Therapy Manual Lymphatic Drainage (MLD);Compression Bandaging;Other (comment)    Manual therapy comments Manual complete separate than rest of tx    Manual Lymphatic Drainage (MLD) Manual lymph decognitive technique anterior only today    Compression Bandaging BLE thigh high multilayer shortstretch bandages with 1/2in foam.    Other Manual Therapy Foam cut for Rt thigh                    PT Short Term Goals - 06/21/20 1446      PT SHORT TERM GOAL #1   Title Pt wounds to have healed B to reduce risk of reinfection    Time 4    Period Weeks    Status Achieved    Target Date 06/12/20      PT SHORT TERM GOAL #2   Title PT to have lost 5 cm in circumference of LE at 30 and 20 cm to improve pt ability to lift leg for stair climbing.    Time 4    Period Weeks  Status On-going      PT SHORT TERM GOAL #3   Title PT to be completing her HEP on a daily basis    Time 4    Period Weeks    Status On-going             PT Long Term Goals - 06/21/20 1447      PT LONG TERM GOAL #1   Title PT to have lost 10 cm of fluid from 30 and 20 cm to allow pt to be able to go up 3 flights of steps to her apartment.    Time 8    Period Weeks    Status On-going      PT LONG TERM GOAL #2   Title PT to have and be able to don compression garments    Time 8    Period Weeks    Status On-going      PT LONG TERM GOAL #3   Title PT to have and be using compression pump    Time 8    Period Weeks    Status On-going                 Plan - 06/23/20 1259    Clinical Impression Statement Discussed importance of regular compliance with attendance to therapy for maximal benefits as pt stated she has to cancel apt for Monday.  Pt has had transportation and medical issues that have kept her from scheduled appointments.  Pt verbalized understanding.  Manual decongestive  techniques complete followed by application of multilayer short stretch bandage wiht 1/2 in foam.  Additional foam cut for Rt thigh this session.    Personal Factors and Comorbidities Comorbidity 3+    Comorbidities hx of cellulitis, morbid obesity and DM    Examination-Activity Limitations Bed Mobility;Carry;Dressing;Hygiene/Grooming;Locomotion Level;Stand;Stairs;Toileting    Examination-Participation Restrictions Cleaning;Church;Laundry;Driving;Community Activity;Meal Prep;Shop    Stability/Clinical Decision Making Evolving/Moderate complexity    Clinical Decision Making Moderate    Rehab Potential Good    PT Frequency 3x / week    PT Duration 8 weeks    PT Treatment/Interventions Patient/family education;Manual lymph drainage;Compression bandaging;Therapeutic exercise    PT Next Visit Plan Continue with wound care and total decongestive techniques.  Measure weekly (wednesday).   F/U with communication from [facility] regarding pump and garments.    PT Home Exercise Plan ankle pump, LAQ, hip ab/adduction, marching and diaphragmic breathing           Patient will benefit from skilled therapeutic intervention in order to improve the following deficits and impairments:  Decreased activity tolerance,Decreased endurance,Decreased mobility,Difficulty walking,Decreased skin integrity,Obesity,Increased edema  Visit Diagnosis: Pain in left lower leg  Lymphedema, not elsewhere classified     Problem List Patient Active Problem List   Diagnosis Date Noted  . Cellulitis 04/27/2020  . AKI (acute kidney injury) ([place]) 04/27/2020  . Stasis dermatitis 04/27/2020  . Anxiety 03/31/2020  . Depression 03/31/2020  . GERD (gastroesophageal reflux disease) 03/31/2020  . Posttraumatic stress disorder 03/31/2020  . Personal history of noncompliance with medical treatment, presenting hazards to health 02/09/2018  . Abnormal CT of the abdomen 10/20/2017  . Dilated cbd, acquired 10/20/2017  . Essential  hypertension, benign 01/14/2017  . Primary hypothyroidism 05/03/2015  . Mixed hyperlipidemia 05/03/2015  . Uncontrolled type 2 diabetes mellitus with complication, with long-term current use of insulin ([place]) 04/25/2015  . Morbid obesity due to excess calories ([place]) 04/25/2015  . Vitamin D deficiency 04/25/2015  . Cervical cancer ([place]) 10/05/2012  Ihor Austin, LPTA/CLT; CBIS [phone]  Aldona Lento 06/23/2020, 1:04 PM  [facility] [address] [place], [place], [address] Phone: [phone]   Fax:  [phone]  Name: Michelle Skinner MRN: [id number] Date of Birth: 08/11/1965

## 2020-06-26 ENCOUNTER — Ambulatory Visit (HOSPITAL_COMMUNITY): Payer: Medicare Other | Admitting: Physical Therapy

## 2020-06-26 DIAGNOSIS — M79672 Pain in left foot: Secondary | ICD-10-CM | POA: Diagnosis not present

## 2020-06-26 DIAGNOSIS — M79671 Pain in right foot: Secondary | ICD-10-CM | POA: Diagnosis not present

## 2020-06-26 DIAGNOSIS — I739 Peripheral vascular disease, unspecified: Secondary | ICD-10-CM | POA: Diagnosis not present

## 2020-06-26 DIAGNOSIS — E1140 Type 2 diabetes mellitus with diabetic neuropathy, unspecified: Secondary | ICD-10-CM | POA: Diagnosis not present

## 2020-06-26 DIAGNOSIS — L110 Acquired keratosis follicularis: Secondary | ICD-10-CM | POA: Diagnosis not present

## 2020-06-28 ENCOUNTER — Ambulatory Visit (HOSPITAL_COMMUNITY): Payer: Medicare Other | Admitting: Physical Therapy

## 2020-06-28 DIAGNOSIS — Z794 Long term (current) use of insulin: Secondary | ICD-10-CM | POA: Diagnosis not present

## 2020-06-28 DIAGNOSIS — E119 Type 2 diabetes mellitus without complications: Secondary | ICD-10-CM | POA: Diagnosis not present

## 2020-07-03 ENCOUNTER — Other Ambulatory Visit: Payer: Self-pay | Admitting: "Endocrinology

## 2020-07-03 DIAGNOSIS — E1165 Type 2 diabetes mellitus with hyperglycemia: Secondary | ICD-10-CM | POA: Diagnosis not present

## 2020-07-03 DIAGNOSIS — E118 Type 2 diabetes mellitus with unspecified complications: Secondary | ICD-10-CM | POA: Diagnosis not present

## 2020-07-03 DIAGNOSIS — Z794 Long term (current) use of insulin: Secondary | ICD-10-CM | POA: Diagnosis not present

## 2020-07-03 DIAGNOSIS — E039 Hypothyroidism, unspecified: Secondary | ICD-10-CM | POA: Diagnosis not present

## 2020-07-04 LAB — LIPID PANEL
Cholesterol: 188 mg/dL (ref <200)
HDL: 42 mg/dL — ABNORMAL LOW (ref >=50)
LDL Cholesterol (Calc): 115 mg/dL (calc) — ABNORMAL HIGH
Non-HDL Cholesterol (Calc): 146 mg/dL (calc) — ABNORMAL HIGH (ref <130)
Total CHOL/HDL Ratio: 4.5 (calc) (ref <5.0)
Triglycerides: 192 mg/dL — ABNORMAL HIGH (ref <150)

## 2020-07-04 LAB — HEMOGLOBIN A1C W/OUT EAG: Hgb A1c MFr Bld: 10.1 % of total Hgb — ABNORMAL HIGH (ref <5.7)

## 2020-07-04 LAB — COMPLETE METABOLIC PANEL WITH GFR
AG Ratio: 1 (calc) (ref 1.0–2.5)
ALT: 11 U/L (ref 6–29)
AST: 11 U/L (ref 10–35)
Albumin: 3.3 g/dL — ABNORMAL LOW (ref 3.6–5.1)
Alkaline phosphatase (APISO): 106 U/L (ref 37–153)
BUN/Creatinine Ratio: 12 (calc) (ref 6–22)
BUN: 13 mg/dL (ref 7–25)
CO2: 30 mmol/L (ref 20–32)
Calcium: 8.8 mg/dL (ref 8.6–10.4)
Chloride: 101 mmol/L (ref 98–110)
Creat: 1.07 mg/dL — ABNORMAL HIGH (ref 0.50–1.05)
GFR, Est African American: 69 mL/min/{1.73_m2} (ref >=60)
GFR, Est Non African American: 60 mL/min/{1.73_m2} (ref >=60)
Globulin: 3.2 g/dL (calc) (ref 1.9–3.7)
Glucose, Bld: 321 mg/dL — ABNORMAL HIGH (ref 65–99)
Potassium: 3.9 mmol/L (ref 3.5–5.3)
Sodium: 139 mmol/L (ref 135–146)
Total Bilirubin: 0.7 mg/dL (ref 0.2–1.2)
Total Protein: 6.5 g/dL (ref 6.1–8.1)

## 2020-07-04 LAB — T4, FREE: Free T4: 1 ng/dL (ref 0.8–1.8)

## 2020-07-04 LAB — TSH: TSH: 10.6 mIU/L — ABNORMAL HIGH

## 2020-07-05 ENCOUNTER — Ambulatory Visit (HOSPITAL_COMMUNITY): Payer: Medicare Other | Admitting: Physical Therapy

## 2020-07-05 ENCOUNTER — Telehealth (HOSPITAL_COMMUNITY): Payer: Self-pay | Admitting: Physical Therapy

## 2020-07-05 ENCOUNTER — Ambulatory Visit: Payer: Medicare Other | Admitting: "Endocrinology

## 2020-07-05 NOTE — Telephone Encounter (Signed)
She is sick on the stomach and will not be able to come in this  morning

## 2020-07-10 ENCOUNTER — Telehealth: Payer: Self-pay | Admitting: Podiatry

## 2020-07-10 ENCOUNTER — Ambulatory Visit (HOSPITAL_COMMUNITY): Payer: Medicare Other | Admitting: Physical Therapy

## 2020-07-10 ENCOUNTER — Encounter (HOSPITAL_COMMUNITY): Payer: Self-pay | Admitting: Physical Therapy

## 2020-07-10 NOTE — Telephone Encounter (Signed)
[facility] sent over paperwork that needed to be signed by Dr. Posey Pronto on 06/26/2020 and left a message on the nurse line on 07/04/2020 to follow up/ Please send signed paperwork ASAP

## 2020-07-10 NOTE — Therapy (Signed)
[facility] [place], [place], [address] Phone: [phone]   Fax:  [phone]  Patient Details  Name: Michelle Skinner MRN: [id number] Date of Birth: 04-Dec-1965 Referring Provider:  No ref. provider found  Encounter Date: 07/10/2020  Sent order to [facility] for compression pump letting them know that she is not ready for garment at this time.   Rayetta Humphrey, PT CLT [phone] 07/10/2020, 10:17 AM  [place], [place], [address] Phone: [phone]   Fax:  [phone]

## 2020-07-12 ENCOUNTER — Encounter: Payer: Self-pay | Admitting: "Endocrinology

## 2020-07-12 ENCOUNTER — Ambulatory Visit (HOSPITAL_COMMUNITY): Payer: Medicare Other | Attending: Podiatry | Admitting: Physical Therapy

## 2020-07-12 ENCOUNTER — Ambulatory Visit (INDEPENDENT_AMBULATORY_CARE_PROVIDER_SITE_OTHER): Payer: Medicare Other | Admitting: "Endocrinology

## 2020-07-12 ENCOUNTER — Other Ambulatory Visit: Payer: Self-pay

## 2020-07-12 VITALS — BP 152/87 | HR 96 | Ht 67.5 in | Wt 342.0 lb

## 2020-07-12 DIAGNOSIS — I10 Essential (primary) hypertension: Secondary | ICD-10-CM | POA: Diagnosis not present

## 2020-07-12 DIAGNOSIS — E039 Hypothyroidism, unspecified: Secondary | ICD-10-CM

## 2020-07-12 DIAGNOSIS — Z794 Long term (current) use of insulin: Secondary | ICD-10-CM | POA: Diagnosis not present

## 2020-07-12 DIAGNOSIS — S81801A Unspecified open wound, right lower leg, initial encounter: Secondary | ICD-10-CM | POA: Diagnosis not present

## 2020-07-12 DIAGNOSIS — M79662 Pain in left lower leg: Secondary | ICD-10-CM | POA: Diagnosis not present

## 2020-07-12 DIAGNOSIS — E118 Type 2 diabetes mellitus with unspecified complications: Secondary | ICD-10-CM

## 2020-07-12 DIAGNOSIS — E782 Mixed hyperlipidemia: Secondary | ICD-10-CM

## 2020-07-12 DIAGNOSIS — S81802A Unspecified open wound, left lower leg, initial encounter: Secondary | ICD-10-CM | POA: Diagnosis not present

## 2020-07-12 DIAGNOSIS — I890 Lymphedema, not elsewhere classified: Secondary | ICD-10-CM | POA: Diagnosis not present

## 2020-07-12 DIAGNOSIS — IMO0002 Reserved for concepts with insufficient information to code with codable children: Secondary | ICD-10-CM

## 2020-07-12 DIAGNOSIS — E1165 Type 2 diabetes mellitus with hyperglycemia: Secondary | ICD-10-CM | POA: Diagnosis not present

## 2020-07-12 MED ORDER — ATORVASTATIN CALCIUM 40 MG PO TABS
40.0000 mg | ORAL_TABLET | Freq: Every day | ORAL | 1 refills | Status: DC
Start: 2020-07-12 — End: 2023-05-13

## 2020-07-12 MED ORDER — LEVOTHYROXINE SODIUM 150 MCG PO TABS
150.0000 ug | ORAL_TABLET | Freq: Every day | ORAL | 1 refills | Status: DC
Start: 2020-07-12 — End: 2024-05-11

## 2020-07-12 MED ORDER — HUMULIN R U-500 KWIKPEN 500 UNIT/ML ~~LOC~~ SOPN
50.0000 [IU] | PEN_INJECTOR | Freq: Three times a day (TID) | SUBCUTANEOUS | 2 refills | Status: DC
Start: 2020-07-12 — End: 2020-09-17

## 2020-07-12 NOTE — Progress Notes (Signed)
07/12/2020  Endocrinology follow-up note  Subjective:    Patient ID: Michelle Skinner, female    DOB: 11-13-1965,    Past Medical History:  Diagnosis Date  . Anemia   . Cervical cancer ([place])   . Depression   . Diabetes mellitus, type II ([place])   . GERD (gastroesophageal reflux disease)   . Hyperlipidemia   . Neuropathy   . PTSD (post-traumatic stress disorder)    Past Surgical History:  Procedure Laterality Date  . ABDOMINAL HYSTERECTOMY    . COLONOSCOPY WITH PROPOFOL N/A 05/25/2018   Procedure: COLONOSCOPY WITH PROPOFOL;  Surgeon: Daneil Dolin, MD;  [facility];  Service: Endoscopy;  Laterality: N/A;  7:30am  . OTHER SURGICAL HISTORY Right    R Foot- I&D  . POLYPECTOMY  05/25/2018   Procedure: POLYPECTOMY;  Surgeon: Daneil Dolin, MD;  [facility];  Service: Endoscopy;;  colon  . UMBILICAL HERNIA REPAIR  2007   Social History   Socioeconomic History  . Marital status: Married    Spouse name: Not on file  . Number of children: Not on file  . Years of education: Not on file  . Highest education level: Not on file  Occupational History  . Not on file  Tobacco Use  . Smoking status: Never Smoker  . Smokeless tobacco: Never Used  Vaping Use  . Vaping Use: Never used  Substance and Sexual Activity  . Alcohol use: No    Alcohol/week: 0.0 standard drinks  . Drug use: No  . Sexual activity: Yes    Birth control/protection: Surgical  Other Topics Concern  . Not on file  Social History Narrative  . Not on file   Social Determinants of Health   Financial Resource Strain: Not on file  Food Insecurity: Not on file  Transportation Needs: Not on file  Physical Activity: Not on file  Stress: Not on file  Social Connections: Not on file   Outpatient Encounter Medications as of 07/12/2020  Medication Sig  . acetaminophen (TYLENOL) 500 MG tablet Take 1,000 mg by mouth every 6 (six) hours as needed for mild pain, fever or headache.  . Ascorbic  Acid (VITAMIN C PO) Take 1 tablet by mouth daily.  Marland Kitchen atorvastatin (LIPITOR) 20 MG tablet TAKE 1 TABLET BY MOUTH  DAILY AT 6 PM. (Patient taking differently: Take 20 mg by mouth daily. TAKE 1 TABLET BY MOUTH  DAILY AT 6 PM.)  . Caffeine-Magnesium Salicylate (DIUREX PO) Take 200 mg by mouth daily.  . carvedilol (COREG) 25 MG tablet Take 1 tablet (25 mg total) by mouth 2 (two) times daily with a meal.  . Cholecalciferol (VITAMIN D3) 125 MCG (5000 UT) CAPS Take 1 capsule (5,000 Units total) by mouth daily.  . Continuous Blood Gluc Receiver (FREESTYLE LIBRE 14 DAY READER) DEVI by Does not apply route 4 (four) times daily.  . Continuous Blood Gluc Receiver (FREESTYLE LIBRE 2 READER) DEVI As directed  . Continuous Blood Gluc Sensor (FREESTYLE LIBRE 2 SENSOR) MISC 1 Piece by Does not apply route every 14 (fourteen) days.  Marland Kitchen doxycycline (ADOXA) 100 MG tablet Take 100 mg by mouth 2 (two) times daily. 10 day supply  . FLUoxetine (PROZAC) 40 MG capsule Take 40 mg by mouth daily.  . hydrALAZINE (APRESOLINE) 25 MG tablet Take 1 tablet (25 mg total) by mouth 2 (two) times daily.  . insulin regular human CONCENTRATED (HUMULIN R U-500 KWIKPEN) 500 UNIT/ML kwikpen Inject 50-60 Units into the skin  3 (three) times daily with meals.  Marland Kitchen levothyroxine (SYNTHROID) 150 MCG tablet Take 1 tablet (150 mcg total) by mouth daily before breakfast.  . Multiple Vitamin (MULTIVITAMIN WITH MINERALS) TABS tablet Take 1 tablet by mouth daily.  Marland Kitchen neomycin-bacitracin-polymyxin (NEOSPORIN) ointment Apply 1 application topically every 12 (twelve) hours. To legs  . omeprazole (PRILOSEC) 20 MG capsule Take 1 capsule (20 mg total) by mouth 2 (two) times daily before a meal. For 10 days while on Pylera for H.pylori (Patient not taking: Reported on 04/27/2020)  . pantoprazole (PROTONIX) 40 MG tablet TAKE ONE (1) TABLET EACH DAY (Patient taking differently: Take 40 mg by mouth daily. )  . Thiamine HCl (VITAMIN B-1 PO) Take 1 tablet by mouth  daily.  Marland Kitchen VITAMIN A PO Take 1 tablet by mouth daily.  . Vitamin D, Ergocalciferol, (DRISDOL) 1.25 MG (50000 UNIT) CAPS capsule Take 1 capsule (50,000 Units total) by mouth every 7 (seven) days. (Patient not taking: Reported on 04/27/2020)  . VITAMIN E PO Take 1 tablet by mouth daily.  . [DISCONTINUED] insulin regular human CONCENTRATED (HUMULIN R U-500 KWIKPEN) 500 UNIT/ML kwikpen Inject 40-50 Units into the skin 3 (three) times daily with meals.  . [DISCONTINUED] levothyroxine (SYNTHROID) 137 MCG tablet TAKE ONE (1) TABLET EACH DAY (Patient taking differently: Take 137 mcg by mouth daily before breakfast. TAKE ONE (1) TABLET EACH DAY)   No facility-administered encounter medications on file as of 07/12/2020.   ALLERGIES: Allergies  Allergen Reactions  . Sulfa Antibiotics Rash  . Bactrim [Sulfamethoxazole-Trimethoprim] Itching  . Cherry Other (See Comments)    Unknown  . Gabapentin Other (See Comments)    Unknown  . Invokana [Canagliflozin] Other (See Comments)    Unknown  . Other     Hot peppers   VACCINATION STATUS: Immunization History  Administered Date(s) Administered  . Influenza,inj,Quad PF,6+ Mos 04/30/2020    Diabetes She presents for her follow-up diabetic visit. She has type 2 diabetes mellitus. Onset time: She was diagnosed at approximate age of 25 years. Her disease course has been worsening. There are no hypoglycemic associated symptoms. Pertinent negatives for hypoglycemia include no confusion, headaches, pallor or seizures. Associated symptoms include fatigue, polydipsia and polyuria. Pertinent negatives for diabetes include no chest pain and no polyphagia. There are no hypoglycemic complications. Symptoms are worsening. Diabetic complications include peripheral neuropathy and retinopathy. Pertinent negatives for diabetic complications include no autonomic neuropathy. (She has bilateral Charcot's feet, bilateral lower extremity lymphedema.) Risk factors for coronary artery  disease include diabetes mellitus, dyslipidemia, hypertension, obesity and sedentary lifestyle. She is compliant with treatment most of the time. Her weight is decreasing steadily. She is following a generally unhealthy diet. She has not had a previous visit with a dietitian (She missed her appointment.). She never participates in exercise. Her home blood glucose trend is decreasing steadily. Her breakfast blood glucose range is generally >200 mg/dl. Her lunch blood glucose range is generally >200 mg/dl. Her dinner blood glucose range is generally >200 mg/dl. Her bedtime blood glucose range is generally >200 mg/dl. Her overall blood glucose range is >200 mg/dl. (She returns with only her CGM device.printout shows average blood glucose of 274, 10% in range, 89% above range.  No significant hypoglycemia.  Her previsit labs show A1c of 10.1%, slightly improving from 10.7%.      ) An ACE inhibitor/angiotensin II receptor blocker is not being taken. Eye exam is current.  Hyperlipidemia This is a chronic problem. The current episode started more than 1 year  ago. The problem is uncontrolled. Recent lipid tests were reviewed and are high. Exacerbating diseases include diabetes, hypothyroidism and obesity. Pertinent negatives include no chest pain, myalgias or shortness of breath. She is currently on no antihyperlipidemic treatment.  Thyroid Problem Presents for initial visit. Symptoms include fatigue. Patient reports no cold intolerance, diarrhea, heat intolerance or palpitations. The following procedures have not been performed: radioiodine uptake scan and thyroidectomy. Her past medical history is significant for diabetes and hyperlipidemia.     Review of systems  Constitutional: + Minimally fluctuating body weight,  current  Body mass index is 52.77 kg/m. , no fatigue, no subjective hyperthermia, no subjective hypothermia    Objective:    BP (!) 152/87   Pulse 96   Ht 5' 7.5" (1.715 m)   Wt (!) 342  lb (155.1 kg)   BMI 52.77 kg/m   Wt Readings from Last 3 Encounters:  07/12/20 (!) 342 lb (155.1 kg)  04/27/20 (!) 375 lb (170.1 kg)  03/06/20 (!) 379 lb (171.9 kg)     Physical Exam- Limited  Constitutional:  Body mass index is 52.77 kg/m. , not in acute distress, normal state of mind Eyes:  EOMI, no exophthalmos Neck: Supple Respiratory: Adequate breathing efforts Musculoskeletal: + Charcot's deformities on bilateral lower extremities, + significant lymphedema on bilateral lower extremities which are large, strength intact in all four extremities, no gross restriction of joint movements.  She saw podiatry since last visit. Skin:  no rashes, no hyperemia Neurological: no tremor with outstretched hands.  Psych: Reluctant affect.    Results for orders placed or performed in visit on 07/03/20  Lipid panel  Result Value Ref Range   Cholesterol 188 <200 mg/dL   HDL 42 (L) > OR = 50 mg/dL   Triglycerides 192 (H) <150 mg/dL   LDL Cholesterol (Calc) 115 (H) mg/dL (calc)   Total CHOL/HDL Ratio 4.5 <5.0 (calc)   Non-HDL Cholesterol (Calc) 146 (H) <130 mg/dL (calc)  COMPLETE METABOLIC PANEL WITH GFR  Result Value Ref Range   Glucose, Bld 321 (H) 65 - 99 mg/dL   BUN 13 7 - 25 mg/dL   Creat 1.07 (H) 0.50 - 1.05 mg/dL   GFR, Est Non African American 60 > OR = 60 mL/min/1.97m2   GFR, Est African American 69 > OR = 60 mL/min/1.14m2   BUN/Creatinine Ratio 12 6 - 22 (calc)   Sodium 139 135 - 146 mmol/L   Potassium 3.9 3.5 - 5.3 mmol/L   Chloride 101 98 - 110 mmol/L   CO2 30 20 - 32 mmol/L   Calcium 8.8 8.6 - 10.4 mg/dL   Total Protein 6.5 6.1 - 8.1 g/dL   Albumin 3.3 (L) 3.6 - 5.1 g/dL   Globulin 3.2 1.9 - 3.7 g/dL (calc)   AG Ratio 1.0 1.0 - 2.5 (calc)   Total Bilirubin 0.7 0.2 - 1.2 mg/dL   Alkaline phosphatase (APISO) 106 37 - 153 U/L   AST 11 10 - 35 U/L   ALT 11 6 - 29 U/L  Hemoglobin A1C w/out eAG  Result Value Ref Range   Hgb A1c MFr Bld 10.1 (H) <5.7 % of total Hgb   TSH  Result Value Ref Range   TSH 10.60 (H) mIU/L  T4, free  Result Value Ref Range   Free T4 1.0 0.8 - 1.8 ng/dL   Diabetic Labs (most recent): Lab Results  Component Value Date   HGBA1C 10.1 (H) 07/03/2020   HGBA1C 10.0 (A) 03/06/2020   HGBA1C  10.7 (A) 11/15/2019    Lipid Panel     Component Value Date/Time   CHOL 188 07/03/2020 1001   TRIG 192 (H) 07/03/2020 1001   HDL 42 (L) 07/03/2020 1001   CHOLHDL 4.5 07/03/2020 1001   VLDL 42 (H) 04/25/2015 1001   LDLCALC 115 (H) 07/03/2020 1001    Assessment & Plan:   1. Uncontrolled type 2 diabetes mellitus with complication, with long-term current use of insulin ([place])   Her diabetes is complicated by bilateral peripheral neuropathy , lateral Charcot's feet, history of noncompliance/nonadherence, and possible retinopathy. - She has chronically uncontrolled symptomatic type 2 DM of approximately since age 22 years duration.  She returns with only her CGM device.printout shows average blood glucose of 274, 10% in range, 89% above range.  No significant hypoglycemia.  Her previsit labs show A1c of 10.1%, slightly improving from 10.7%.    -She remains at extremely high risk for more acute and chronic complications of diabetes which include CAD, CVA, CKD, retinopathy, and neuropathy. These are all discussed in detail with the patient.   - I have counseled the patient on diet management and weight loss, by adopting a carbohydrate restricted/protein rich diet.  - Patient is advised to stick to a routine mealtimes to eat 3 meals  a day and avoid unnecessary snacks ( to snack only to correct hypoglycemia).   - she acknowledges that there is a room for improvement in her food and drink choices. - Suggestion is made for her to avoid simple carbohydrates  from her diet including Cakes, Sweet Desserts, Ice Cream, Soda (diet and regular), Sweet Tea, Candies, Chips, Cookies, Store Bought Juices, Alcohol in Excess of  1-2 drinks a day,  Artificial Sweeteners,  Coffee Creamer, and "Sugar-free" Products, Lemonade. This will help patient to have more stable blood glucose profile and potentially avoid unintended weight gain.  - I encouraged the patient to switch to  unprocessed or minimally processed complex starch and increased protein intake (animal or plant source), fruits, and vegetables.   - I have approached patient with the following individualized plan to manage diabetes and patient agrees:   -She will continue to require intensive treatment with multiple daily injections of insulin in order for her to achieve and control of diabetes to target.  She continues to do better with insulin U500 versus Tresiba/NovoLog.    -She did not document any hypoglycemia.  She is advised to increase her U50 0 to 60 units with breakfast, 60 units with lunch, and 50 units with supper   for [place] blood glucose readings above 90 mg per DL.    -She is currently using the libre CGM device, advised to wear it at all times. - She is warned not to take insulin without monitoring blood glucose properly.   -Patient is encouraged to call clinic for blood glucose levels less than 70 or above 300 mg /dl. - she is adamant saying that she does not tolerate MTF, SGLT2 inhibitors,  And GLP-1 receptor agonist including [place].  Interpretation of CGM printouts discussed with her. - Patient specific target  A1c;  LDL, HDL, Triglycerides were discussed in detail.  2) BP/HTN: Her blood pressure is not controlled to target.   She is advised to take her blood pressure medications in the morning clonidine lisinopril 20 mg daily, carvedilol 25 mg p.o. twice daily, advised on salt restrictions.     3) Lipids/HPL: Most recent lipid panel showed LDL increasing to 115 from 109.  She is  advised to continue Lipitor 20 mg p.o. daily at bedtime.  Her next refill will be Lipitor 40 mg p.o. nightly.   Side effects and precautions discussed with her.       4) vitamin D  deficiency:  -She is advised to continue vitamin D2 50,000 units weekly in addition to her regular maintenance dose of vitamin D3 5000 units daily.     5)  Weight/Diet: She lost 40 pounds since last visit.  Her BMI is 52.77 Clearly this is complicating her care for diabetes.  She is a candidate for modest weight loss.  She cannot exercise optimally.  She keeps missing her appointment with  CDE exercise, and carbohydrates information provided.   6) hypothyroidism:  -Her recent thyroid function tests are consistent with inadequate replacement.  I discussed and increase her levothyroxine to 150 mcg p.o. daily before breakfast.     - We discussed about the correct intake of her thyroid hormone, on empty stomach at fasting, with water, separated by at least 30 minutes from breakfast and other medications,  and separated by more than 4 hours from calcium, iron, multivitamins, acid reflux medications (PPIs). -Patient is made aware of the fact that thyroid hormone replacement is needed for life, dose to be adjusted by periodic monitoring of thyroid function tests.   6) Chronic Care/Health Maintenance:  -Patient is advised to continue lisinopril, Lipitor. She is encouraged to continue to follow up with Ophthalmology, Podiatrist at least yearly or according to recommendations, and advised to stay away from smoking. I have recommended yearly flu vaccine and pneumonia vaccination at least every 5 years; moderate intensity exercise for up to 150 minutes weekly; and  sleep for at least 7 hours a day.  -Given her significant peripheral neuropathy and bilateral Charcot's feet, she will benefit from continued supply of diabetic shoes. -She was referred to podiatry last visit, advised to maintain close follow-up.  She was also advised to follow-up in lymphedema clinic.  I advised patient to maintain close follow up with their PCP for primary care needs.   - Time spent on this patient care encounter:  40 min,  of which > 50% was spent in  counseling and the rest reviewing her blood glucose logs , discussing her hypoglycemia and hyperglycemia episodes, reviewing her current and  previous labs / studies  ( including abstraction from other facilities) and medications  doses and developing a  long term treatment plan and documenting her care.   Please refer to Patient Instructions for Blood Glucose Monitoring and Insulin/Medications Dosing Guide"  in media tab for additional information. Please  also refer to " Patient Self Inventory" in the Media  tab for reviewed elements of pertinent patient history.  Michelle Skinner participated in the discussions, expressed understanding, and voiced agreement with the above plans.  All questions were answered to her satisfaction. she is encouraged to contact clinic should she have any questions or concerns prior to her return visit.   Follow up plan: Return in about 4 weeks (around 08/09/2020) for F/U with Meter and Logs Only - no Labs.  Glade Lloyd, MD Phone: [phone]  Fax: [phone]  -  This note was partially dictated with voice recognition software. Similar sounding words can be transcribed inadequately or may not  be corrected upon review.  07/12/2020, 2:12 PM

## 2020-07-12 NOTE — Therapy (Addendum)
[place], [place], [address] Phone: [phone]   Fax:  [phone]  Physical Therapy Treatment RE-CERT  Patient Details  Name: Michelle Skinner MRN: [id number] Date of Birth: 03-May-1966 Referring Provider (PT): Boneta Lucks    Encounter Date: 07/12/2020   PT End of Session - 07/12/20 1427    Visit Number 12    Number of Visits 24    Date for PT Re-Evaluation 08/10/20    Authorization Type [employer] medicare    Progress Note Due on Visit 20    PT Start Time 1050    PT Stop Time 1218    PT Time Calculation (min) 88 min    Activity Tolerance Patient tolerated treatment well    Behavior During Therapy [facility] for tasks assessed/performed           Past Medical History:  Diagnosis Date  . Anemia   . Cervical cancer ([place])   . Depression   . Diabetes mellitus, type II ([place])   . GERD (gastroesophageal reflux disease)   . Hyperlipidemia   . Neuropathy   . PTSD (post-traumatic stress disorder)     Past Surgical History:  Procedure Laterality Date  . ABDOMINAL HYSTERECTOMY    . COLONOSCOPY WITH PROPOFOL N/A 05/25/2018   Procedure: COLONOSCOPY WITH PROPOFOL;  Surgeon: Daneil Dolin, MD;  [facility];  Service: Endoscopy;  Laterality: N/A;  7:30am  . OTHER SURGICAL HISTORY Right    R Foot- I&D  . POLYPECTOMY  05/25/2018   Procedure: POLYPECTOMY;  Surgeon: Daneil Dolin, MD;  Location: [facility];  Service: Endoscopy;;  colon  . UMBILICAL HERNIA REPAIR  2007    There were no vitals filed for this visit.   Subjective Assessment - 07/12/20 1420    Subjective pt states her gastroparesis has flaired back up and she has been really sick. States she is unable to eat; returning to MD today.   States her legs have actually improved over the 2 weeks but may be due to weight loss.    Currently in Pain? No/denies              [facility] PT Assessment - 07/13/20 0001      Precautions   Precautions --    cellulitis      Home Environment   Available Help at Discharge --   spouse is on dialysis 3 x a wk   Home Layout Multi-level      Prior Function   Level of Independence Independent with basic ADLs      Cognition   Overall Cognitive Status Within Functional Limits for tasks assessed      Observation/Other Assessments   Focus on Therapeutic Outcomes (FOTO)  --   life impact 55             LYMPHEDEMA/ONCOLOGY QUESTIONNAIRE - 07/12/20 1422      Right Lower Extremity Lymphedema   20 cm Proximal to Suprapatella 83 cm   was 90   10 cm Proximal to Suprapatella 76 cm   was 81.4   At Midpatella/Popliteal Crease 55 cm   was 55.3   30 cm Proximal to Floor at Lateral Plantar Foot 54.5 cm   was 64   20 cm Proximal to Floor at Lateral Plantar Foot 47 1   was 61.8   10 cm Proximal to Floor at Lateral Malleoli 31.5 cm   was 45.3   Circumference of ankle/heel 39 cm.  was 41.2   5 cm Proximal to 1st MTP Joint 29 cm   was 29.8   Across MTP Joint 27 cm   was 29.4   Around Proximal Great Toe 11 cm   was 12.5     Left Lower Extremity Lymphedema   20 cm Proximal to Suprapatella 82.5 cm   was 83.4   10 cm Proximal to Suprapatella 74 cm   was 87.6   At Midpatella/Popliteal Crease 56 cm   was 58.4   30 cm Proximal to Floor at Lateral Plantar Foot 61.5 cm   was 77.5   20 cm Proximal to Floor at Lateral Plantar Foot 57.5 cm   was 70.9   10 cm Proximal to Floor at Lateral Malleoli 43 cm   was 53   Circumference of ankle/heel 38 cm.   was 40.5   5 cm Proximal to 1st MTP Joint 29.5 cm   was 29.7   Across MTP Joint 28 cm   was 29.2   Around Proximal Great Toe 12 cm   was 13.5                               PT Short Term Goals - 07/12/20 1507      PT SHORT TERM GOAL #1   Title Pt wounds to have healed B to reduce risk of reinfection    Time 4    Period Weeks    Status Achieved    Target Date 06/12/20      PT SHORT TERM GOAL #2   Title PT to have lost 5 cm in  circumference of LE at 30 and 20 cm to improve pt ability to lift leg for stair climbing.    Baseline 1/5:  lost 5cm bilaterally except for Lt 30cm location    Time 4    Period Weeks    Status Partially Met      PT SHORT TERM GOAL #3   Title PT to be completing her HEP on a daily basis    Time 4    Period Weeks    Status Partially Met             PT Long Term Goals - 06/21/20 1447      PT LONG TERM GOAL #1   Title PT to have lost 10 cm of fluid from 30 and 20 cm to allow pt to be able to go up 3 flights of steps to her apartment.    Time 8    Period Weeks    Status On-going      PT LONG TERM GOAL #2   Title PT to have and be able to don compression garments    Time 8    Period Weeks    Status On-going      PT LONG TERM GOAL #3   Title PT to have and be using compression pump    Time 8    Period Weeks    Status On-going                 Plan - 07/12/20 1540    Clinical Impression Statement Pt has completed 12 visits over the past 8 weeks.  Pt has had transportation and medical issues that have kept her from scheduled appointments.  Pt returned today following 2 week leave due to flair up of gastroparesis.  Pt weighed this session with loss of 35# since last  being weighted on 12/03.  Aside from Rt foot/ankle, all areas have decompressed nicely over the course of treatment despite absence of treatment.   Pt has yet to receive her compression pump.  Message sent to rep regarding this and to measure for night garment.  Moisturized LE's followed by application of multilayer short stretch bandage with 1/2 in foam and short stretch toe to thigh bilaterally.    Pt will benefit from continued lymphedema treatment.    Personal Factors and Comorbidities Comorbidity 3+    Comorbidities hx of cellulitis, morbid obesity and DM    Examination-Activity Limitations Bed Mobility;Carry;Dressing;Hygiene/Grooming;Locomotion Level;Stand;Stairs;Toileting    Examination-Participation  Restrictions Cleaning;Church;Laundry;Driving;Community Activity;Meal Prep;Shop    Stability/Clinical Decision Making Evolving/Moderate complexity    Rehab Potential Good    PT Frequency 3x / week    PT Duration 8 weeks   extend 4 more weeks due to medical issues pt was not able to be seen on a consistent basis.   PT Treatment/Interventions Patient/family education;Manual lymph drainage;Compression bandaging;Therapeutic exercise    PT Next Visit Plan Continue with total decongestive techniques.  Measure weekly (wednesday).   F/U with communication from [facility] regarding pump and garments.    PT Home Exercise Plan ankle pump, LAQ, hip ab/adduction, marching and diaphragmic breathing           Patient will benefit from skilled therapeutic intervention in order to improve the following deficits and impairments:  Decreased activity tolerance,Decreased endurance,Decreased mobility,Difficulty walking,Decreased skin integrity,Obesity,Increased edema  Visit Diagnosis: Lymphedema, not elsewhere classified  Open wound of left lower leg, initial encounter  Open wound of right lower leg, initial encounter  Pain in left lower leg     Problem List Patient Active Problem List   Diagnosis Date Noted  . Cellulitis 04/27/2020  . AKI (acute kidney injury) ([place]) 04/27/2020  . Stasis dermatitis 04/27/2020  . Anxiety 03/31/2020  . Depression 03/31/2020  . GERD (gastroesophageal reflux disease) 03/31/2020  . Posttraumatic stress disorder 03/31/2020  . Personal history of noncompliance with medical treatment, presenting hazards to health 02/09/2018  . Abnormal CT of the abdomen 10/20/2017  . Dilated cbd, acquired 10/20/2017  . Essential hypertension, benign 01/14/2017  . Primary hypothyroidism 05/03/2015  . Mixed hyperlipidemia 05/03/2015  . Uncontrolled type 2 diabetes mellitus with complication, with long-term current use of insulin ([place]) 04/25/2015  . Morbid obesity due to excess calories ([place])  04/25/2015  . Vitamin D deficiency 04/25/2015  . Cervical cancer ([place]) 10/05/2012   [name] Irani, PTA/CLT [place], PT CLT [phone] 07/13/2020, 1:52 PM  [place] [address] [place], [place], [address] Phone: [phone]   Fax:  [phone]  Name: Michelle Skinner MRN: [id number] Date of Birth: [birth year]

## 2020-07-12 NOTE — Patient Instructions (Signed)
                                     Advice for Weight Management  -For most of us the best way to lose weight is by diet management. Generally speaking, diet management means consuming less calories intentionally which over time brings about progressive weight loss.  This can be achieved more effectively by restricting carbohydrate consumption to the minimum possible.  So, it is critically important to know your numbers: how much calorie you are consuming and how much calorie you need. More importantly, our carbohydrates sources should be unprocessed or minimally processed complex starch food items.   Sometimes, it is important to balance nutrition by increasing protein intake (animal or plant source), fruits, and vegetables.  -Sticking to a routine mealtime to eat 3 meals a day and avoiding unnecessary snacks is shown to have a big role in weight control. Under normal circumstances, the only time we lose real weight is when we are hungry, so allow hunger to take place- hunger means no food between meal times, only water.  It is not advisable to starve.   -It is better to avoid simple carbohydrates including: Cakes, Sweet Desserts, Ice Cream, Soda (diet and regular), Sweet Tea, Candies, Chips, Cookies, Store Bought Juices, Alcohol in Excess of  1-2 drinks a day, Lemonade,  Artificial Sweeteners, Doughnuts, Coffee Creamers, "Sugar-free" Products, etc, etc.  This is not a complete list.....    -Consulting with certified diabetes educators is proven to provide you with the most accurate and current information on diet.  Also, you may be  interested in discussing diet options/exchanges , we can schedule a visit with Michelle Skinner, RDN, CDE for individualized nutrition education.  -Exercise: If you are able: 30 -60 minutes a day ,4 days a week, or 150 minutes a week.  The longer the better.  Combine stretch, strength, and aerobic activities.  If you were told in the  past that you have high risk for cardiovascular diseases, you may seek evaluation by your heart doctor prior to initiating moderate to intense exercise programs.                                  Additional Care Considerations for Diabetes   -Diabetes  is a chronic disease.  The most important care consideration is regular follow-up with your diabetes care provider with the goal being avoiding or delaying its complications and to take advantage of advances in medications and technology.    -Type 2 diabetes is known to coexist with other important comorbidities such as high blood pressure and high cholesterol.  It is critical to control not only the diabetes but also the high blood pressure and high cholesterol to minimize and delay the risk of complications including coronary artery disease, stroke, amputations, blindness, etc.    - Studies showed that people with diabetes will benefit from a class of medications known as ACE inhibitors and statins.  Unless there are specific reasons not to be on these medications, the standard of care is to consider getting one from these groups of medications at an optimal doses.  These medications are generally considered safe and proven to help protect the heart and the kidneys.    - People with diabetes are encouraged to initiate and maintain regular follow-up with eye doctors, foot   doctors, dentists , and if necessary heart and kidney doctors.     - It is highly recommended that people with diabetes quit smoking or stay away from smoking, and get yearly  flu vaccine and pneumonia vaccine at least every 5 years.  One other important lifestyle recommendation is to ensure adequate sleep - at least 6-7 hours of uninterrupted sleep at night.  -Exercise: If you are able: 30 -60 minutes a day, 4 days a week, or 150 minutes a week.  The longer the better.  Combine stretch, strength, and aerobic activities.  If you were told in the past that you have high risk for  cardiovascular diseases, you may seek evaluation by your heart doctor prior to initiating moderate to intense exercise programs.          

## 2020-07-13 NOTE — Telephone Encounter (Signed)
I did send the paper over and it should be faxed

## 2020-07-13 NOTE — Addendum Note (Signed)
Addended by: Leeroy Cha on: 07/13/2020 05:04 PM   Modules accepted: Orders

## 2020-07-14 ENCOUNTER — Ambulatory Visit (HOSPITAL_COMMUNITY): Payer: Medicare Other | Admitting: Physical Therapy

## 2020-07-17 ENCOUNTER — Ambulatory Visit (HOSPITAL_COMMUNITY): Payer: Medicare Other | Admitting: Physical Therapy

## 2020-07-17 ENCOUNTER — Telehealth (HOSPITAL_COMMUNITY): Payer: Self-pay | Admitting: Physical Therapy

## 2020-07-17 NOTE — Telephone Encounter (Signed)
pt called to cx due to she is not feeling well

## 2020-07-19 ENCOUNTER — Telehealth: Payer: Self-pay | Admitting: *Deleted

## 2020-07-19 ENCOUNTER — Ambulatory Visit (HOSPITAL_COMMUNITY): Payer: Medicare Other | Admitting: Physical Therapy

## 2020-07-19 NOTE — Telephone Encounter (Signed)
Michelle Skinner w/ [facility] [phone])  is requesting that a form be refaxed w/ doctor's signature to their office. It has been sent twice.This patient is trying to get a compression pump.

## 2020-07-19 NOTE — Telephone Encounter (Signed)
[facility] [place]) to make sure that they had received the form,confirmed form sent on 07/19/20 by staff member.

## 2020-07-21 ENCOUNTER — Ambulatory Visit (HOSPITAL_COMMUNITY): Payer: Medicare Other

## 2020-07-24 ENCOUNTER — Ambulatory Visit (HOSPITAL_COMMUNITY): Payer: Medicare Other | Admitting: Physical Therapy

## 2020-07-26 ENCOUNTER — Telehealth (HOSPITAL_COMMUNITY): Payer: Self-pay

## 2020-07-26 ENCOUNTER — Ambulatory Visit (HOSPITAL_COMMUNITY): Payer: Medicare Other

## 2020-07-26 NOTE — Telephone Encounter (Signed)
No show, called and spoke to pt.  Pt stated she is currently moving down to 1st floor apt.  Due to non-compliance with 3x/week decision made to pause lymphedema care until she feels able to attend more regularly.  Pt given contact number for Corene Cornea at [facility] concerning pump.  Pt encouraged to contact MD with new referral when wishes to return to therapy.    Ihor Austin, LPTA/CLT; Delana Meyer [phone]'

## 2020-07-28 ENCOUNTER — Ambulatory Visit (HOSPITAL_COMMUNITY): Payer: Medicare Other

## 2020-07-29 DIAGNOSIS — Z794 Long term (current) use of insulin: Secondary | ICD-10-CM | POA: Diagnosis not present

## 2020-07-29 DIAGNOSIS — E119 Type 2 diabetes mellitus without complications: Secondary | ICD-10-CM | POA: Diagnosis not present

## 2020-08-07 DIAGNOSIS — F32A Depression, unspecified: Secondary | ICD-10-CM | POA: Diagnosis not present

## 2020-08-07 DIAGNOSIS — I10 Essential (primary) hypertension: Secondary | ICD-10-CM | POA: Diagnosis not present

## 2020-08-07 DIAGNOSIS — E7800 Pure hypercholesterolemia, unspecified: Secondary | ICD-10-CM | POA: Diagnosis not present

## 2020-08-07 DIAGNOSIS — K219 Gastro-esophageal reflux disease without esophagitis: Secondary | ICD-10-CM | POA: Diagnosis not present

## 2020-08-09 ENCOUNTER — Ambulatory Visit: Payer: Medicare Other | Admitting: "Endocrinology

## 2020-08-14 DIAGNOSIS — I890 Lymphedema, not elsewhere classified: Secondary | ICD-10-CM | POA: Diagnosis not present

## 2020-08-23 ENCOUNTER — Other Ambulatory Visit: Payer: Self-pay | Admitting: "Endocrinology

## 2020-08-23 NOTE — Telephone Encounter (Signed)
Please advise 

## 2020-08-28 ENCOUNTER — Other Ambulatory Visit: Payer: Self-pay | Admitting: "Endocrinology

## 2020-08-28 MED ORDER — CARVEDILOL 25 MG PO TABS
25.0000 mg | ORAL_TABLET | Freq: Two times a day (BID) | ORAL | 1 refills | Status: DC
Start: 2020-08-28 — End: 2020-09-11

## 2020-08-29 DIAGNOSIS — Z794 Long term (current) use of insulin: Secondary | ICD-10-CM | POA: Diagnosis not present

## 2020-08-29 DIAGNOSIS — E119 Type 2 diabetes mellitus without complications: Secondary | ICD-10-CM | POA: Diagnosis not present

## 2020-09-06 ENCOUNTER — Emergency Department (HOSPITAL_COMMUNITY): Payer: Medicare Other

## 2020-09-06 ENCOUNTER — Other Ambulatory Visit: Payer: Self-pay

## 2020-09-06 ENCOUNTER — Encounter (HOSPITAL_COMMUNITY): Payer: Self-pay

## 2020-09-06 ENCOUNTER — Inpatient Hospital Stay (HOSPITAL_COMMUNITY)
Admission: EM | Admit: 2020-09-06 | Discharge: 2020-09-17 | DRG: 853 | Disposition: A | Discharge status: Skilled Nursing Facility | Payer: Medicare Other | Attending: Student | Admitting: Student

## 2020-09-06 DIAGNOSIS — Z79890 Hormone replacement therapy (postmenopausal): Secondary | ICD-10-CM

## 2020-09-06 DIAGNOSIS — D631 Anemia in chronic kidney disease: Secondary | ICD-10-CM | POA: Diagnosis not present

## 2020-09-06 DIAGNOSIS — L97519 Non-pressure chronic ulcer of other part of right foot with unspecified severity: Secondary | ICD-10-CM | POA: Diagnosis not present

## 2020-09-06 DIAGNOSIS — L03116 Cellulitis of left lower limb: Secondary | ICD-10-CM | POA: Diagnosis not present

## 2020-09-06 DIAGNOSIS — A419 Sepsis, unspecified organism: Secondary | ICD-10-CM | POA: Diagnosis not present

## 2020-09-06 DIAGNOSIS — L03115 Cellulitis of right lower limb: Secondary | ICD-10-CM | POA: Diagnosis present

## 2020-09-06 DIAGNOSIS — R000 Tachycardia, unspecified: Secondary | ICD-10-CM | POA: Diagnosis not present

## 2020-09-06 DIAGNOSIS — R52 Pain, unspecified: Secondary | ICD-10-CM | POA: Diagnosis not present

## 2020-09-06 DIAGNOSIS — E1169 Type 2 diabetes mellitus with other specified complication: Secondary | ICD-10-CM | POA: Diagnosis not present

## 2020-09-06 DIAGNOSIS — B9689 Other specified bacterial agents as the cause of diseases classified elsewhere: Secondary | ICD-10-CM | POA: Diagnosis not present

## 2020-09-06 DIAGNOSIS — I70261 Atherosclerosis of native arteries of extremities with gangrene, right leg: Secondary | ICD-10-CM | POA: Diagnosis not present

## 2020-09-06 DIAGNOSIS — M19072 Primary osteoarthritis, left ankle and foot: Secondary | ICD-10-CM | POA: Diagnosis not present

## 2020-09-06 DIAGNOSIS — Z881 Allergy status to other antibiotic agents status: Secondary | ICD-10-CM

## 2020-09-06 DIAGNOSIS — M86172 Other acute osteomyelitis, left ankle and foot: Secondary | ICD-10-CM | POA: Diagnosis not present

## 2020-09-06 DIAGNOSIS — Z7401 Bed confinement status: Secondary | ICD-10-CM | POA: Diagnosis not present

## 2020-09-06 DIAGNOSIS — E118 Type 2 diabetes mellitus with unspecified complications: Secondary | ICD-10-CM | POA: Diagnosis not present

## 2020-09-06 DIAGNOSIS — L0390 Cellulitis, unspecified: Secondary | ICD-10-CM | POA: Diagnosis not present

## 2020-09-06 DIAGNOSIS — M869 Osteomyelitis, unspecified: Secondary | ICD-10-CM | POA: Diagnosis not present

## 2020-09-06 DIAGNOSIS — Z872 Personal history of diseases of the skin and subcutaneous tissue: Secondary | ICD-10-CM | POA: Diagnosis not present

## 2020-09-06 DIAGNOSIS — Z789 Other specified health status: Secondary | ICD-10-CM | POA: Diagnosis not present

## 2020-09-06 DIAGNOSIS — D62 Acute posthemorrhagic anemia: Secondary | ICD-10-CM | POA: Diagnosis not present

## 2020-09-06 DIAGNOSIS — T797XXA Traumatic subcutaneous emphysema, initial encounter: Secondary | ICD-10-CM | POA: Diagnosis not present

## 2020-09-06 DIAGNOSIS — R6520 Severe sepsis without septic shock: Secondary | ICD-10-CM | POA: Diagnosis present

## 2020-09-06 DIAGNOSIS — I10 Essential (primary) hypertension: Secondary | ICD-10-CM | POA: Diagnosis not present

## 2020-09-06 DIAGNOSIS — E11621 Type 2 diabetes mellitus with foot ulcer: Secondary | ICD-10-CM | POA: Diagnosis present

## 2020-09-06 DIAGNOSIS — B9561 Methicillin susceptible Staphylococcus aureus infection as the cause of diseases classified elsewhere: Secondary | ICD-10-CM | POA: Diagnosis not present

## 2020-09-06 DIAGNOSIS — N1831 Chronic kidney disease, stage 3a: Secondary | ICD-10-CM | POA: Diagnosis present

## 2020-09-06 DIAGNOSIS — E1165 Type 2 diabetes mellitus with hyperglycemia: Secondary | ICD-10-CM

## 2020-09-06 DIAGNOSIS — I129 Hypertensive chronic kidney disease with stage 1 through stage 4 chronic kidney disease, or unspecified chronic kidney disease: Secondary | ICD-10-CM | POA: Diagnosis not present

## 2020-09-06 DIAGNOSIS — L03119 Cellulitis of unspecified part of limb: Secondary | ICD-10-CM

## 2020-09-06 DIAGNOSIS — N179 Acute kidney failure, unspecified: Secondary | ICD-10-CM | POA: Diagnosis not present

## 2020-09-06 DIAGNOSIS — F32A Depression, unspecified: Secondary | ICD-10-CM | POA: Diagnosis not present

## 2020-09-06 DIAGNOSIS — E1142 Type 2 diabetes mellitus with diabetic polyneuropathy: Secondary | ICD-10-CM | POA: Diagnosis not present

## 2020-09-06 DIAGNOSIS — E1122 Type 2 diabetes mellitus with diabetic chronic kidney disease: Secondary | ICD-10-CM | POA: Diagnosis not present

## 2020-09-06 DIAGNOSIS — S91301A Unspecified open wound, right foot, initial encounter: Secondary | ICD-10-CM | POA: Diagnosis not present

## 2020-09-06 DIAGNOSIS — Z833 Family history of diabetes mellitus: Secondary | ICD-10-CM

## 2020-09-06 DIAGNOSIS — M86071 Acute hematogenous osteomyelitis, right ankle and foot: Secondary | ICD-10-CM

## 2020-09-06 DIAGNOSIS — R531 Weakness: Secondary | ICD-10-CM

## 2020-09-06 DIAGNOSIS — L97529 Non-pressure chronic ulcer of other part of left foot with unspecified severity: Secondary | ICD-10-CM

## 2020-09-06 DIAGNOSIS — M86072 Acute hematogenous osteomyelitis, left ankle and foot: Secondary | ICD-10-CM | POA: Diagnosis present

## 2020-09-06 DIAGNOSIS — L97513 Non-pressure chronic ulcer of other part of right foot with necrosis of muscle: Secondary | ICD-10-CM | POA: Diagnosis not present

## 2020-09-06 DIAGNOSIS — E039 Hypothyroidism, unspecified: Secondary | ICD-10-CM | POA: Diagnosis present

## 2020-09-06 DIAGNOSIS — B952 Enterococcus as the cause of diseases classified elsewhere: Secondary | ICD-10-CM | POA: Diagnosis not present

## 2020-09-06 DIAGNOSIS — L02415 Cutaneous abscess of right lower limb: Secondary | ICD-10-CM | POA: Diagnosis not present

## 2020-09-06 DIAGNOSIS — I70262 Atherosclerosis of native arteries of extremities with gangrene, left leg: Secondary | ICD-10-CM | POA: Diagnosis not present

## 2020-09-06 DIAGNOSIS — R339 Retention of urine, unspecified: Secondary | ICD-10-CM | POA: Diagnosis not present

## 2020-09-06 DIAGNOSIS — Z743 Need for continuous supervision: Secondary | ICD-10-CM | POA: Diagnosis not present

## 2020-09-06 DIAGNOSIS — Z882 Allergy status to sulfonamides status: Secondary | ICD-10-CM

## 2020-09-06 DIAGNOSIS — R7881 Bacteremia: Secondary | ICD-10-CM

## 2020-09-06 DIAGNOSIS — Z91018 Allergy to other foods: Secondary | ICD-10-CM

## 2020-09-06 DIAGNOSIS — Z20822 Contact with and (suspected) exposure to covid-19: Secondary | ICD-10-CM | POA: Diagnosis present

## 2020-09-06 DIAGNOSIS — E669 Obesity, unspecified: Secondary | ICD-10-CM

## 2020-09-06 DIAGNOSIS — L97929 Non-pressure chronic ulcer of unspecified part of left lower leg with unspecified severity: Secondary | ICD-10-CM | POA: Diagnosis not present

## 2020-09-06 DIAGNOSIS — R600 Localized edema: Secondary | ICD-10-CM | POA: Diagnosis not present

## 2020-09-06 DIAGNOSIS — IMO0002 Reserved for concepts with insufficient information to code with codable children: Secondary | ICD-10-CM

## 2020-09-06 DIAGNOSIS — M7989 Other specified soft tissue disorders: Secondary | ICD-10-CM | POA: Diagnosis not present

## 2020-09-06 DIAGNOSIS — L089 Local infection of the skin and subcutaneous tissue, unspecified: Secondary | ICD-10-CM | POA: Diagnosis not present

## 2020-09-06 DIAGNOSIS — E1159 Type 2 diabetes mellitus with other circulatory complications: Secondary | ICD-10-CM

## 2020-09-06 DIAGNOSIS — E43 Unspecified severe protein-calorie malnutrition: Secondary | ICD-10-CM

## 2020-09-06 DIAGNOSIS — L02416 Cutaneous abscess of left lower limb: Secondary | ICD-10-CM | POA: Diagnosis present

## 2020-09-06 DIAGNOSIS — A4101 Sepsis due to Methicillin susceptible Staphylococcus aureus: Secondary | ICD-10-CM | POA: Diagnosis not present

## 2020-09-06 DIAGNOSIS — T8182XA Emphysema (subcutaneous) resulting from a procedure, initial encounter: Secondary | ICD-10-CM | POA: Diagnosis not present

## 2020-09-06 DIAGNOSIS — Z299 Encounter for prophylactic measures, unspecified: Secondary | ICD-10-CM | POA: Diagnosis not present

## 2020-09-06 DIAGNOSIS — Q600 Renal agenesis, unilateral: Secondary | ICD-10-CM | POA: Diagnosis not present

## 2020-09-06 DIAGNOSIS — I517 Cardiomegaly: Secondary | ICD-10-CM | POA: Diagnosis not present

## 2020-09-06 DIAGNOSIS — R2689 Other abnormalities of gait and mobility: Secondary | ICD-10-CM | POA: Diagnosis not present

## 2020-09-06 DIAGNOSIS — K5900 Constipation, unspecified: Secondary | ICD-10-CM | POA: Diagnosis not present

## 2020-09-06 DIAGNOSIS — Z794 Long term (current) use of insulin: Secondary | ICD-10-CM | POA: Diagnosis not present

## 2020-09-06 DIAGNOSIS — M86161 Other acute osteomyelitis, right tibia and fibula: Secondary | ICD-10-CM | POA: Diagnosis not present

## 2020-09-06 DIAGNOSIS — E782 Mixed hyperlipidemia: Secondary | ICD-10-CM | POA: Diagnosis not present

## 2020-09-06 DIAGNOSIS — R279 Unspecified lack of coordination: Secondary | ICD-10-CM | POA: Diagnosis not present

## 2020-09-06 DIAGNOSIS — R030 Elevated blood-pressure reading, without diagnosis of hypertension: Secondary | ICD-10-CM | POA: Diagnosis not present

## 2020-09-06 DIAGNOSIS — I890 Lymphedema, not elsewhere classified: Secondary | ICD-10-CM | POA: Diagnosis not present

## 2020-09-06 DIAGNOSIS — M2550 Pain in unspecified joint: Secondary | ICD-10-CM | POA: Diagnosis not present

## 2020-09-06 DIAGNOSIS — E1110 Type 2 diabetes mellitus with ketoacidosis without coma: Secondary | ICD-10-CM | POA: Diagnosis present

## 2020-09-06 DIAGNOSIS — Z6841 Body Mass Index (BMI) 40.0 and over, adult: Secondary | ICD-10-CM

## 2020-09-06 DIAGNOSIS — R5381 Other malaise: Secondary | ICD-10-CM | POA: Diagnosis not present

## 2020-09-06 DIAGNOSIS — S91302A Unspecified open wound, left foot, initial encounter: Secondary | ICD-10-CM | POA: Diagnosis not present

## 2020-09-06 DIAGNOSIS — L97512 Non-pressure chronic ulcer of other part of right foot with fat layer exposed: Secondary | ICD-10-CM | POA: Diagnosis not present

## 2020-09-06 DIAGNOSIS — K219 Gastro-esophageal reflux disease without esophagitis: Secondary | ICD-10-CM | POA: Diagnosis present

## 2020-09-06 DIAGNOSIS — Z8249 Family history of ischemic heart disease and other diseases of the circulatory system: Secondary | ICD-10-CM

## 2020-09-06 DIAGNOSIS — Z90710 Acquired absence of both cervix and uterus: Secondary | ICD-10-CM

## 2020-09-06 DIAGNOSIS — I878 Other specified disorders of veins: Secondary | ICD-10-CM | POA: Diagnosis not present

## 2020-09-06 DIAGNOSIS — I700 Atherosclerosis of aorta: Secondary | ICD-10-CM | POA: Diagnosis not present

## 2020-09-06 DIAGNOSIS — F419 Anxiety disorder, unspecified: Secondary | ICD-10-CM | POA: Diagnosis present

## 2020-09-06 DIAGNOSIS — Z8541 Personal history of malignant neoplasm of cervix uteri: Secondary | ICD-10-CM

## 2020-09-06 DIAGNOSIS — L97522 Non-pressure chronic ulcer of other part of left foot with fat layer exposed: Secondary | ICD-10-CM | POA: Diagnosis not present

## 2020-09-06 DIAGNOSIS — Z888 Allergy status to other drugs, medicaments and biological substances status: Secondary | ICD-10-CM

## 2020-09-06 DIAGNOSIS — L97919 Non-pressure chronic ulcer of unspecified part of right lower leg with unspecified severity: Secondary | ICD-10-CM | POA: Diagnosis not present

## 2020-09-06 DIAGNOSIS — Z79899 Other long term (current) drug therapy: Secondary | ICD-10-CM

## 2020-09-06 DIAGNOSIS — L97523 Non-pressure chronic ulcer of other part of left foot with necrosis of muscle: Secondary | ICD-10-CM | POA: Diagnosis not present

## 2020-09-06 DIAGNOSIS — F4310 Post-traumatic stress disorder, unspecified: Secondary | ICD-10-CM | POA: Diagnosis present

## 2020-09-06 DIAGNOSIS — Z83438 Family history of other disorder of lipoprotein metabolism and other lipidemia: Secondary | ICD-10-CM

## 2020-09-06 DIAGNOSIS — M6281 Muscle weakness (generalized): Secondary | ICD-10-CM | POA: Diagnosis not present

## 2020-09-06 LAB — PROTIME-INR
INR: 1.6 — ABNORMAL HIGH (ref 0.8–1.2)
Prothrombin Time: 18.5 seconds — ABNORMAL HIGH (ref 11.4–15.2)

## 2020-09-06 LAB — COMPREHENSIVE METABOLIC PANEL
ALT: 14 U/L (ref 0–44)
AST: 12 U/L — ABNORMAL LOW (ref 15–41)
Albumin: 1.6 g/dL — ABNORMAL LOW (ref 3.5–5.0)
Alkaline Phosphatase: 120 U/L (ref 38–126)
Anion gap: 26 — ABNORMAL HIGH (ref 5–15)
BUN: 52 mg/dL — ABNORMAL HIGH (ref 6–20)
CO2: 10 mmol/L — ABNORMAL LOW (ref 22–32)
Calcium: 8.8 mg/dL — ABNORMAL LOW (ref 8.9–10.3)
Chloride: 88 mmol/L — ABNORMAL LOW (ref 98–111)
Creatinine, Ser: 1.94 mg/dL — ABNORMAL HIGH (ref 0.44–1.00)
GFR, Estimated: 30 mL/min — ABNORMAL LOW (ref >60)
Glucose, Bld: 591 mg/dL (ref 70–99)
Potassium: 3.9 mmol/L (ref 3.5–5.1)
Sodium: 124 mmol/L — ABNORMAL LOW (ref 135–145)
Total Bilirubin: 2.4 mg/dL — ABNORMAL HIGH (ref 0.3–1.2)
Total Protein: 7 g/dL (ref 6.5–8.1)

## 2020-09-06 LAB — URINALYSIS, ROUTINE W REFLEX MICROSCOPIC
Bilirubin Urine: NEGATIVE
Glucose, UA: >=500 mg/dL — AB
Ketones, ur: 20 mg/dL — AB
Leukocytes,Ua: NEGATIVE
Nitrite: NEGATIVE
Protein, ur: 100 mg/dL — AB
Specific Gravity, Urine: 1.017 (ref 1.005–1.030)
pH: 5 (ref 5.0–8.0)

## 2020-09-06 LAB — BLOOD GAS, VENOUS
Acid-base deficit: 15.4 mmol/L — ABNORMAL HIGH (ref 0.0–2.0)
Bicarbonate: 12.2 mmol/L — ABNORMAL LOW (ref 20.0–28.0)
FIO2: 21
O2 Saturation: 57 %
Patient temperature: 36.4
pCO2, Ven: 26.6 mmHg — ABNORMAL LOW (ref 44.0–60.0)
pH, Ven: 7.229 — ABNORMAL LOW (ref 7.250–7.430)
pO2, Ven: 36.2 mmHg (ref 32.0–45.0)

## 2020-09-06 LAB — CBC WITH DIFFERENTIAL/PLATELET
Abs Immature Granulocytes: 1.62 10*3/uL — ABNORMAL HIGH (ref 0.00–0.07)
Basophils Absolute: 0 10*3/uL (ref 0.0–0.1)
Basophils Relative: 0 %
Eosinophils Absolute: 0 10*3/uL (ref 0.0–0.5)
Eosinophils Relative: 0 %
HCT: 31.6 % — ABNORMAL LOW (ref 36.0–46.0)
Hemoglobin: 9.8 g/dL — ABNORMAL LOW (ref 12.0–15.0)
Immature Granulocytes: 6 %
Lymphocytes Relative: 3 %
Lymphs Abs: 0.9 10*3/uL (ref 0.7–4.0)
MCH: 28.2 pg (ref 26.0–34.0)
MCHC: 31 g/dL (ref 30.0–36.0)
MCV: 91.1 fL (ref 80.0–100.0)
Monocytes Absolute: 0.8 10*3/uL (ref 0.1–1.0)
Monocytes Relative: 3 %
Neutro Abs: 22.1 10*3/uL — ABNORMAL HIGH (ref 1.7–7.7)
Neutrophils Relative %: 88 %
Platelets: 402 10*3/uL — ABNORMAL HIGH (ref 150–400)
RBC: 3.47 MIL/uL — ABNORMAL LOW (ref 3.87–5.11)
RDW: 17.2 % — ABNORMAL HIGH (ref 11.5–15.5)
WBC: 25.4 10*3/uL — ABNORMAL HIGH (ref 4.0–10.5)
nRBC: 0 % (ref 0.0–0.2)

## 2020-09-06 LAB — LACTIC ACID, PLASMA
Lactic Acid, Venous: 2.1 mmol/L (ref 0.5–1.9)
Lactic Acid, Venous: 2.2 mmol/L (ref 0.5–1.9)

## 2020-09-06 LAB — RESP PANEL BY RT-PCR (FLU A&B, COVID) ARPGX2
Influenza A by PCR: NEGATIVE
Influenza B by PCR: NEGATIVE
SARS Coronavirus 2 by RT PCR: NEGATIVE

## 2020-09-06 LAB — APTT: aPTT: 30 seconds (ref 24–36)

## 2020-09-06 LAB — GLUCOSE, CAPILLARY
Glucose-Capillary: 271 mg/dL — ABNORMAL HIGH (ref 70–99)
Glucose-Capillary: 333 mg/dL — ABNORMAL HIGH (ref 70–99)
Glucose-Capillary: 341 mg/dL — ABNORMAL HIGH (ref 70–99)

## 2020-09-06 LAB — CBG MONITORING, ED
Glucose-Capillary: 448 mg/dL — ABNORMAL HIGH (ref 70–99)
Glucose-Capillary: 308 mg/dL — ABNORMAL HIGH (ref 70–99)
Glucose-Capillary: 329 mg/dL — ABNORMAL HIGH (ref 70–99)

## 2020-09-06 LAB — HIV ANTIBODY (ROUTINE TESTING W REFLEX): HIV Screen 4th Generation wRfx: NONREACTIVE

## 2020-09-06 LAB — BETA-HYDROXYBUTYRIC ACID: Beta-Hydroxybutyric Acid: >8 mmol/L — ABNORMAL HIGH (ref 0.05–0.27)

## 2020-09-06 MED ORDER — ATORVASTATIN CALCIUM 40 MG PO TABS
40.0000 mg | ORAL_TABLET | Freq: Every day | ORAL | Status: DC
  Administered 2020-09-06 – 2020-09-17 (×11): 40 mg via ORAL
  Filled 2020-09-06 (×12): qty 1

## 2020-09-06 MED ORDER — VANCOMYCIN HCL 2000 MG/400ML IV SOLN
2000.0000 mg | Freq: Once | INTRAVENOUS | Status: AC
  Administered 2020-09-06: 2000 mg via INTRAVENOUS
  Filled 2020-09-06: qty 400

## 2020-09-06 MED ORDER — SODIUM CHLORIDE 0.9 % IV SOLN
2.0000 g | INTRAVENOUS | Status: DC
  Administered 2020-09-06 – 2020-09-07 (×2): 2 g via INTRAVENOUS
  Filled 2020-09-06 (×3): qty 20

## 2020-09-06 MED ORDER — POTASSIUM CHLORIDE 10 MEQ/100ML IV SOLN
INTRAVENOUS | Status: AC
  Administered 2020-09-06: 10 meq via INTRAVENOUS
  Filled 2020-09-06: qty 100

## 2020-09-06 MED ORDER — PANTOPRAZOLE SODIUM 40 MG PO TBEC
40.0000 mg | DELAYED_RELEASE_TABLET | Freq: Every day | ORAL | Status: DC
  Administered 2020-09-06 – 2020-09-17 (×9): 40 mg via ORAL
  Filled 2020-09-06 (×10): qty 1

## 2020-09-06 MED ORDER — LEVOTHYROXINE SODIUM 75 MCG PO TABS
150.0000 ug | ORAL_TABLET | Freq: Every day | ORAL | Status: DC
  Administered 2020-09-07 – 2020-09-17 (×9): 150 ug via ORAL
  Filled 2020-09-06 (×9): qty 2

## 2020-09-06 MED ORDER — TRAZODONE HCL 50 MG PO TABS
25.0000 mg | ORAL_TABLET | Freq: Every evening | ORAL | Status: DC | PRN
  Administered 2020-09-06 – 2020-09-08 (×3): 25 mg via ORAL
  Filled 2020-09-06 (×3): qty 1

## 2020-09-06 MED ORDER — TRAMADOL HCL 50 MG PO TABS
100.0000 mg | ORAL_TABLET | Freq: Four times a day (QID) | ORAL | Status: DC | PRN
  Administered 2020-09-06: 100 mg via ORAL
  Filled 2020-09-06: qty 2

## 2020-09-06 MED ORDER — SENNA 8.6 MG PO TABS
1.0000 | ORAL_TABLET | Freq: Two times a day (BID) | ORAL | Status: DC
  Administered 2020-09-06 – 2020-09-17 (×18): 8.6 mg via ORAL
  Filled 2020-09-06 (×20): qty 1

## 2020-09-06 MED ORDER — INSULIN GLARGINE 100 UNIT/ML ~~LOC~~ SOLN
20.0000 [IU] | Freq: Every day | SUBCUTANEOUS | Status: DC
  Filled 2020-09-06: qty 0.2

## 2020-09-06 MED ORDER — LACTATED RINGERS IV SOLN: INTRAVENOUS | Status: AC

## 2020-09-06 MED ORDER — INSULIN REGULAR(HUMAN) IN NACL 100-0.9 UT/100ML-% IV SOLN
INTRAVENOUS | Status: DC
  Administered 2020-09-06: 11.5 [IU]/h via INTRAVENOUS
  Filled 2020-09-06: qty 100

## 2020-09-06 MED ORDER — DEXTROSE IN LACTATED RINGERS 5 % IV SOLN
INTRAVENOUS | Status: DC
Start: 2020-09-06 — End: 2020-09-07

## 2020-09-06 MED ORDER — DEXTROSE 50 % IV SOLN: 0.0000 mL | INTRAVENOUS | Status: DC | PRN

## 2020-09-06 MED ORDER — INSULIN ASPART 100 UNIT/ML ~~LOC~~ SOLN
0.0000 [IU] | Freq: Three times a day (TID) | SUBCUTANEOUS | Status: DC
  Administered 2020-09-07: 16:00:00 7 [IU] via SUBCUTANEOUS
  Administered 2020-09-08: 17:00:00 15 [IU] via SUBCUTANEOUS
  Administered 2020-09-09: 13:00:00 7 [IU] via SUBCUTANEOUS
  Administered 2020-09-09: 11 [IU] via SUBCUTANEOUS
  Administered 2020-09-09: 17:00:00 4 [IU] via SUBCUTANEOUS
  Administered 2020-09-10: 3 [IU] via SUBCUTANEOUS
  Administered 2020-09-10: 4 [IU] via SUBCUTANEOUS
  Administered 2020-09-10: 7 [IU] via SUBCUTANEOUS
  Administered 2020-09-11: 3 [IU] via SUBCUTANEOUS
  Administered 2020-09-12 – 2020-09-13 (×3): 7 [IU] via SUBCUTANEOUS
  Administered 2020-09-14: 20 [IU] via SUBCUTANEOUS
  Administered 2020-09-14: 3 [IU] via SUBCUTANEOUS
  Administered 2020-09-14: 20 [IU] via SUBCUTANEOUS
  Administered 2020-09-15: 3 [IU] via SUBCUTANEOUS
  Administered 2020-09-15 – 2020-09-16 (×2): 4 [IU] via SUBCUTANEOUS
  Administered 2020-09-17: 3 [IU] via SUBCUTANEOUS

## 2020-09-06 MED ORDER — ACETAMINOPHEN 500 MG PO TABS: 1000.0000 mg | ORAL_TABLET | Freq: Four times a day (QID) | ORAL | Status: DC | PRN

## 2020-09-06 MED ORDER — ENOXAPARIN SODIUM 30 MG/0.3ML ~~LOC~~ SOLN
30.0000 mg | SUBCUTANEOUS | Status: DC
  Administered 2020-09-06: 30 mg via SUBCUTANEOUS
  Filled 2020-09-06: qty 0.3

## 2020-09-06 MED ORDER — MAGNESIUM OXIDE 400 (241.3 MG) MG PO TABS
200.0000 mg | ORAL_TABLET | Freq: Every day | ORAL | Status: DC
  Administered 2020-09-06 – 2020-09-17 (×9): 200 mg via ORAL
  Filled 2020-09-06 (×10): qty 1

## 2020-09-06 MED ORDER — VANCOMYCIN HCL 1750 MG/350ML IV SOLN
1750.0000 mg | INTRAVENOUS | Status: DC
  Administered 2020-09-07: 1750 mg via INTRAVENOUS
  Filled 2020-09-06: qty 350

## 2020-09-06 MED ORDER — CARVEDILOL 12.5 MG PO TABS
25.0000 mg | ORAL_TABLET | Freq: Two times a day (BID) | ORAL | Status: DC
  Administered 2020-09-06: 25 mg via ORAL
  Filled 2020-09-06 (×2): qty 2

## 2020-09-06 MED ORDER — LACTATED RINGERS IV BOLUS (SEPSIS)
1000.0000 mL | Freq: Once | INTRAVENOUS | Status: AC
  Administered 2020-09-06: 1000 mL via INTRAVENOUS

## 2020-09-06 MED ORDER — FENTANYL CITRATE (PF) 100 MCG/2ML IJ SOLN
50.0000 ug | Freq: Once | INTRAMUSCULAR | Status: AC
  Administered 2020-09-06: 50 ug via INTRAVENOUS
  Filled 2020-09-06: qty 2

## 2020-09-06 MED ORDER — VANCOMYCIN HCL 1750 MG/350ML IV SOLN: 1750.0000 mg | INTRAVENOUS | Status: DC

## 2020-09-06 MED ORDER — HYDRALAZINE HCL 25 MG PO TABS
25.0000 mg | ORAL_TABLET | Freq: Two times a day (BID) | ORAL | Status: DC
  Administered 2020-09-06: 25 mg via ORAL
  Filled 2020-09-06 (×2): qty 1

## 2020-09-06 MED ORDER — POTASSIUM CHLORIDE 10 MEQ/100ML IV SOLN
10.0000 meq | INTRAVENOUS | Status: AC
  Filled 2020-09-06: qty 100

## 2020-09-06 MED ORDER — VITAMIN D 25 MCG (1000 UNIT) PO TABS
5000.0000 [IU] | ORAL_TABLET | Freq: Every day | ORAL | Status: DC
  Administered 2020-09-06 – 2020-09-17 (×9): 5000 [IU] via ORAL
  Filled 2020-09-06 (×10): qty 5

## 2020-09-06 MED ORDER — FLUOXETINE HCL 20 MG PO CAPS
40.0000 mg | ORAL_CAPSULE | Freq: Every day | ORAL | Status: DC
  Administered 2020-09-06 – 2020-09-17 (×9): 40 mg via ORAL
  Filled 2020-09-06 (×10): qty 2

## 2020-09-06 NOTE — ED Notes (Signed)
Date and time results received: 09/06/20 1511 (use smartphrase ".now" to insert current time)  Test: Glucose Critical Value: 591  Name of Provider Notified: Tripplet PA  Orders Received? Or Actions Taken?: Orders Received - See Orders for details

## 2020-09-06 NOTE — ED Notes (Addendum)
Date and time results received: 09/06/20 now (use smartphrase ".now" to insert current time)  Test: Lactic Critical Value: 2.1  Name of Provider Notified: Tripplett PA  Orders Received? Or Actions Taken?: Orders Received - See Orders for details

## 2020-09-06 NOTE — Progress Notes (Signed)
Pharmacy Antibiotic Note  Michelle Skinner is a 55 y.o. female admitted on 09/06/2020 with wound infection.  Pharmacy has been consulted for vancomycin dosing.  Plan: Vancomycin 1750mg  IV every 24 hours.  Goal trough 15-20 mcg/mL.  Height: 5' 7.5" (171.5 cm) Weight: (!) 158.8 kg (350 lb) IBW/kg (Calculated) : 62.75  Temp (24hrs), Avg:97.5 F (36.4 C), Min:97.5 F (36.4 C), Max:97.5 F (36.4 C)  Recent Labs  Lab 09/06/20 1401  WBC 25.4*  CREATININE 1.94*  LATICACIDVEN 2.1*    Estimated Creatinine Clearance: 53.6 mL/min (A) (by C-G formula based on SCr of 1.94 mg/dL (H)).    Allergies  Allergen Reactions  . Sulfa Antibiotics Rash  . Bactrim [Sulfamethoxazole-Trimethoprim] Itching  . Cherry Other (See Comments)    Unknown  . Gabapentin Other (See Comments)    Unknown  . Invokana [Canagliflozin] Other (See Comments)    Unknown  . Other     Hot peppers    Antimicrobials this admission: 3/2 ceftriaxone >>  3/2 vancomycin >>   Microbiology results: 3/2 BCx: set 3/2 Resp Panel: negative   Thank you for allowing pharmacy to be a part of this patient's care.  Donna Christen Tyquan Carmickle 09/06/2020 4:06 PM

## 2020-09-06 NOTE — ED Notes (Signed)
Pt returned from MRI, confirmed with Dr. Linda Hedges potassium 3.9, potassium ordered per [facility] and has not yet been administered, PT/INR 18.5/1.6. Both Lovenox and potassium to be administered per MAR. Appropriate to go to assigned bed.

## 2020-09-06 NOTE — ED Provider Notes (Signed)
[facility] Provider Note   CSN: [id number] Arrival date & time: 09/06/20  1220     History Chief Complaint  Patient presents with  . Foot Pain    Michelle Skinner is a 55 y.o. female.  HPI      Michelle Skinner is a 55 y.o. female with past medical history of type 2 diabetes, anemia, lymphedema and neuropathy who presents to the Emergency Department complaining of pain, bleeding in drainage of open wounds to bilateral feet.  She states that 3 weeks ago, she was walking outside barefoot to install a TV antenna and developed a "bone bruise" of her right lateral foot.  She noticed gradually increasing pain and redness of her right foot that later spread to her left foot.  She was seen at her PCPs office in [place] earlier today and advised to come to the emergency department for further evaluation.  She describes stinging, throbbing pain to both feet, left greater than right.  States that she delayed seeking medical attention because she has been caring for her husband who had a amputation 3 weeks ago.  She states that she trims some "dead skin" to the bottom of both feet yesterday.  She states that she has been experiencing increase thirst.  Blood sugar at home this morning was 188 per patient.  She denies fever, chills, chest pain or shortness of breath.    Past Medical History:  Diagnosis Date  . Anemia   . Cervical cancer ([place])   . Depression   . Diabetes mellitus, type II ([place])   . GERD (gastroesophageal reflux disease)   . Hyperlipidemia   . Neuropathy   . PTSD (post-traumatic stress disorder)     Patient Active Problem List   Diagnosis Date Noted  . Cellulitis 04/27/2020  . AKI (acute kidney injury) ([place]) 04/27/2020  . Stasis dermatitis 04/27/2020  . Anxiety 03/31/2020  . Depression 03/31/2020  . GERD (gastroesophageal reflux disease) 03/31/2020  . Posttraumatic stress disorder 03/31/2020  . Personal history of noncompliance with medical treatment,  presenting hazards to health 02/09/2018  . Abnormal CT of the abdomen 10/20/2017  . Dilated cbd, acquired 10/20/2017  . Essential hypertension, benign 01/14/2017  . Primary hypothyroidism 05/03/2015  . Mixed hyperlipidemia 05/03/2015  . Uncontrolled type 2 diabetes mellitus with complication, with long-term current use of insulin ([place]) 04/25/2015  . Morbid obesity due to excess calories ([place]) 04/25/2015  . Vitamin D deficiency 04/25/2015  . Cervical cancer ([place]) 10/05/2012    Past Surgical History:  Procedure Laterality Date  . ABDOMINAL HYSTERECTOMY    . COLONOSCOPY WITH PROPOFOL N/A 05/25/2018   Procedure: COLONOSCOPY WITH PROPOFOL;  Surgeon: Daneil Dolin, MD;  Location: [facility];  Service: Endoscopy;  Laterality: N/A;  7:30am  . OTHER SURGICAL HISTORY Right    R Foot- I&D  . POLYPECTOMY  05/25/2018   Procedure: POLYPECTOMY;  Surgeon: Daneil Dolin, MD;  Location: [facility];  Service: Endoscopy;;  colon  . UMBILICAL HERNIA REPAIR  2007     OB History   No obstetric history on file.     Family History  Problem Relation Age of Onset  . Hypertension Mother   . Diabetes Father   . Hyperlipidemia Father   . CAD Father   . Stroke Father   . Osteoporosis Maternal Grandmother   . Cancer Maternal Grandmother   . Hypertension Maternal Grandmother   . Colon cancer Neg Hx     Social History  Tobacco Use  . Smoking status: Never Smoker  . Smokeless tobacco: Never Used  Vaping Use  . Vaping Use: Never used  Substance Use Topics  . Alcohol use: No    Alcohol/week: 0.0 standard drinks  . Drug use: No    Home Medications Prior to Admission medications   Medication Sig Start Date End Date Taking? Authorizing Provider  acetaminophen (TYLENOL) 500 MG tablet Take 1,000 mg by mouth every 6 (six) hours as needed for mild pain, fever or headache.    [provider]  Ascorbic Acid (VITAMIN C PO) Take 1 tablet by mouth daily.    [provider]   atorvastatin (LIPITOR) 40 MG tablet Take 1 tablet (40 mg total) by mouth at bedtime. 07/12/20   Cassandria Anger, MD  Caffeine-Magnesium Salicylate (DIUREX PO) Take 200 mg by mouth daily.    [provider]  carvedilol (COREG) 25 MG tablet Take 1 tablet (25 mg total) by mouth 2 (two) times daily with a meal. 08/28/20   [name], Marella Chimes, MD  Cholecalciferol (VITAMIN D3) 125 MCG (5000 UT) CAPS Take 1 capsule (5,000 Units total) by mouth daily. 05/13/18   Cassandria Anger, MD  Continuous Blood Gluc Receiver (FREESTYLE LIBRE 14 DAY READER) DEVI by Does not apply route 4 (four) times daily.    [provider]  Continuous Blood Gluc Receiver (FREESTYLE LIBRE 2 READER) DEVI As directed 03/06/20   Cassandria Anger, MD  Continuous Blood Gluc Sensor (FREESTYLE LIBRE 2 SENSOR) MISC 1 Piece by Does not apply route every 14 (fourteen) days. 03/06/20   Cassandria Anger, MD  doxycycline (ADOXA) 100 MG tablet Take 100 mg by mouth 2 (two) times daily. 10 day supply    [provider]  FLUoxetine (PROZAC) 40 MG capsule Take 40 mg by mouth daily.    [provider]  hydrALAZINE (APRESOLINE) 25 MG tablet Take 1 tablet (25 mg total) by mouth 2 (two) times daily. 05/04/20 05/04/21  Katsadouros, Vasilios, MD  insulin regular human CONCENTRATED (HUMULIN R U-500 KWIKPEN) 500 UNIT/ML kwikpen Inject 50-60 Units into the skin 3 (three) times daily with meals. 07/12/20   Cassandria Anger, MD  levothyroxine (SYNTHROID) 150 MCG tablet Take 1 tablet (150 mcg total) by mouth daily before breakfast. 07/12/20   [name], Marella Chimes, MD  Multiple Vitamin (MULTIVITAMIN WITH MINERALS) TABS tablet Take 1 tablet by mouth daily.    [provider]  neomycin-bacitracin-polymyxin (NEOSPORIN) ointment Apply 1 application topically every 12 (twelve) hours. To legs    [provider]  omeprazole (PRILOSEC) 20 MG capsule Take 1 capsule (20 mg total) by mouth 2 (two) times  daily before a meal. For 10 days while on Pylera for H.pylori Patient not taking: Reported on 04/27/2020 03/05/18   [name], PA-C  pantoprazole (PROTONIX) 40 MG tablet TAKE ONE (1) [place] Patient taking differently: Take 40 mg by mouth daily.  12/01/18   Cassandria Anger, MD  Thiamine HCl (VITAMIN B-1 PO) Take 1 tablet by mouth daily.    [provider]  VITAMIN A PO Take 1 tablet by mouth daily.    [provider]  Vitamin D, Ergocalciferol, (DRISDOL) 1.25 MG (50000 UNIT) CAPS capsule Take 1 capsule (50,000 Units total) by mouth every 7 (seven) days. Patient not taking: Reported on 04/27/2020 11/15/19   Cassandria Anger, MD  VITAMIN E PO Take 1 tablet by mouth daily.    [provider]  Allergies    Sulfa antibiotics, Bactrim [sulfamethoxazole-trimethoprim], Cherry, Gabapentin, Invokana [canagliflozin], and Other  Review of Systems   Review of Systems  Constitutional: Negative for chills, fatigue and fever.  Eyes: Negative for visual disturbance.  Respiratory: Negative for cough and shortness of breath.   Cardiovascular: Negative for chest pain and palpitations.  Gastrointestinal: Negative for abdominal pain, diarrhea, nausea and vomiting.  Endocrine: Positive for polydipsia.  Genitourinary: Negative for dysuria and flank pain.  Musculoskeletal: Positive for arthralgias (pain, redness bilateral feet). Negative for back pain and myalgias.  Skin: Positive for color change and wound. Negative for rash.       Open, draining sore to both feet including toes  Neurological: Negative for dizziness, syncope, weakness and numbness.  Hematological: Does not bruise/bleed easily.  Psychiatric/Behavioral: Negative for confusion.    Physical Exam Updated Vital Signs BP (!) 129/59   Pulse 97   Temp (!) 97.5 F (36.4 C) (Oral)   Resp (!) 24   Ht 5' 7.5" (1.715 m)   Wt (!) 158.8 kg   SpO2 100%   BMI 54.01 kg/m   Physical  Exam Constitutional:      Appearance: She is obese. She is not toxic-appearing.  HENT:     Head: Normocephalic.     Mouth/Throat:     Mouth: Mucous membranes are dry.  Eyes:     Pupils: Pupils are equal, round, and reactive to light.  Neck:     Thyroid: No thyromegaly.     Meningeal: Kernig's sign absent.  Cardiovascular:     Rate and Rhythm: Normal rate and regular rhythm.  Pulmonary:     Effort: Pulmonary effort is normal.     Breath sounds: Normal breath sounds. No wheezing.  Abdominal:     Palpations: Abdomen is soft.     Tenderness: There is no abdominal tenderness. There is no guarding or rebound.  Musculoskeletal:        General: Tenderness and signs of injury present.     Cervical back: Normal range of motion and neck supple.     Right lower leg: Edema present.     Left lower leg: Edema present.     Comments: Malodorous, wounds of bilateral feet and toes with erythema extending to mid lower leg.  See photos below.    Skin:    General: Skin is warm.     Findings: No rash.  Neurological:     Mental Status: She is alert and oriented to person, place, and time.     Sensory: No sensory deficit.     Motor: No weakness.             Patient gave verbal consent for photos to be stored in medical record.     ED Results / Procedures / Treatments   Labs (all labs ordered are listed, but only abnormal results are displayed) Labs Reviewed  LACTIC ACID, PLASMA - Abnormal; Notable for the following components:      Result Value   Lactic Acid, Venous 2.1 (*)    All other components within normal limits  COMPREHENSIVE METABOLIC PANEL - Abnormal; Notable for the following components:   Sodium 124 (*)    Chloride 88 (*)    CO2 10 (*)    Glucose, Bld 591 (*)    BUN 52 (*)    Creatinine, Ser 1.94 (*)    Calcium 8.8 (*)    Albumin 1.6 (*)    AST 12 (*)    Total Bilirubin  2.4 (*)    GFR, Estimated 30 (*)    Anion gap 26 (*)    All other components within normal  limits  CBC WITH DIFFERENTIAL/PLATELET - Abnormal; Notable for the following components:   WBC 25.4 (*)    RBC 3.47 (*)    Hemoglobin 9.8 (*)    HCT 31.6 (*)    RDW 17.2 (*)    Platelets 402 (*)    Neutro Abs 22.1 (*)    Abs Immature Granulocytes 1.62 (*)    All other components within normal limits  PROTIME-INR - Abnormal; Notable for the following components:   Prothrombin Time 18.5 (*)    INR 1.6 (*)    All other components within normal limits  URINALYSIS, ROUTINE W REFLEX MICROSCOPIC - Abnormal; Notable for the following components:   Glucose, UA >=500 (*)    Hgb urine dipstick SMALL (*)    Ketones, ur 20 (*)    Protein, ur 100 (*)    Bacteria, UA RARE (*)    All other components within normal limits  RESP PANEL BY RT-PCR (FLU A&B, COVID) ARPGX2  CULTURE, BLOOD (ROUTINE X 2)  CULTURE, BLOOD (ROUTINE X 2)  URINE CULTURE  APTT  LACTIC ACID, PLASMA  BETA-HYDROXYBUTYRIC ACID  BETA-HYDROXYBUTYRIC ACID  BLOOD GAS, VENOUS  CBG MONITORING, ED    EKG EKG Interpretation  Date/Time:  Wednesday September 06 2020 14:00:58 EST Ventricular Rate:  113 PR Interval:    QRS Duration: 116 QT Interval:  374 QTC Calculation: 437 R Axis:   -49 Text Interpretation: Sinus tachycardia Multiform ventricular premature complexes Left anterior fascicular block increased ectopy from prior 11/19 Confirmed by Aletta Edouard [phone]) on 09/06/2020 2:03:53 PM   Radiology DG Chest Port 1 View  Result Date: 09/06/2020 CLINICAL DATA:  Ulcerations to bilateral feet, lower extremity swelling, generalized weakness. EXAM: PORTABLE CHEST 1 VIEW COMPARISON:  Chest x-ray dated 11/15/2019 FINDINGS: Borderline cardiomegaly, stable. Lungs are clear. No pleural effusion or pneumothorax is seen. Osseous structures about the chest are unremarkable. IMPRESSION: 1. No active disease. No evidence of pneumonia or pulmonary edema. 2. Stable borderline cardiomegaly. Electronically Signed   By: Franki Cabot M.D.   On:  09/06/2020 15:20   DG Foot Complete Left  Result Date: 09/06/2020 CLINICAL DATA:  Severe ulcerations to bilateral feet. Lower extremity swelling, generalized weakness. EXAM: LEFT FOOT - COMPLETE 3+ VIEW COMPARISON:  None. FINDINGS: Deformities of the fourth and fifth proximal phalanx, most likely chronic based on configuration and superimposed callus formation. No acute appearing fracture line or displaced fracture fragment. No destructive changes elsewhere within the LEFT foot to suggest osteomyelitis. Patchy lucencies within the soft tissues along the volar aspect of the LEFT midfoot, predominantly at the level of the tarsals and metatarsals, suspicious for soft tissue gas, presumably related to overlying soft tissue ulceration. Chronic degenerative spurring within the midfoot and hindfoot, mild to moderate in degree, most prominent at the talar beak. IMPRESSION: 1. Evidence of soft tissue gas underlying the LEFT midfoot, presumably related to overlying soft tissue ulceration, necrotizing fasciitis not excluded. 2. No acute appearing osseous abnormality. No destructive changes to confirm an associated osteomyelitis. 3. Chronic appearing fractures/deformities of the fourth and fifth proximal phalanx. Electronically Signed   By: Franki Cabot M.D.   On: 09/06/2020 15:31   DG Foot Complete Right  Result Date: 09/06/2020 CLINICAL DATA:  Weakness, severe ulcerations to bilateral feet, lower extremity swelling. EXAM: RIGHT FOOT COMPLETE - 3+ VIEW COMPARISON:  None. FINDINGS: Osseous  alignment is normal. No fracture line or displaced fracture fragment. No acute appearing cortical irregularity or osseous lesion. No destructive change is seen to confirm osteomyelitis. Degenerative spurring within the midfoot and hindfoot, mild to moderate in degree. Vascular calcifications. Presumed soft tissue edema. No evidence of soft tissue gas. IMPRESSION: 1. Presumed soft tissue edema. No evidence of soft tissue gas. 2. No  acute appearing osseous abnormality. No evidence of osteomyelitis. Electronically Signed   By: Franki Cabot M.D.   On: 09/06/2020 15:22    Procedures Procedures   Medications Ordered in ED Medications  lactated ringers infusion (has no administration in time range)  lactated ringers bolus 1,000 mL (1,000 mLs Intravenous New Bag/Given 09/06/20 1528)    And  lactated ringers bolus 1,000 mL (1,000 mLs Intravenous New Bag/Given 09/06/20 1454)  cefTRIAXone (ROCEPHIN) 2 g in sodium chloride 0.9 % 100 mL IVPB (2 g Intravenous New Bag/Given 09/06/20 1455)  insulin regular, human (MYXREDLIN) 100 units/ 100 mL infusion (has no administration in time range)  dextrose 5 % in lactated ringers infusion (has no administration in time range)  dextrose 50 % solution 0-50 mL (has no administration in time range)  potassium chloride 10 mEq in 100 mL IVPB (has no administration in time range)    ED Course  I have reviewed the triage vital signs and the nursing notes.  Pertinent labs & imaging results that were available during my care of the patient were reviewed by me and considered in my medical decision making (see chart for details).  Clinical Course as of 09/06/20 1407  Wed Sep 06, 4536  2043 55 year old female here for bilateral foot pain in the setting of a recent contusion.  Sounds like she cut off some of the skin in the area and now they grossly look infected.  Morbidly obese significant peripheral edema and erythema.  Getting labs x-rays.  Will need admission. [MB]    Clinical Course User Index [MB] Hayden Rasmussen, MD    CRITICAL CARE Performed by: Taniesha Glanz Total critical care time: 40  minutes Critical care time was exclusive of separately billable procedures and treating other patients. Critical care was necessary to treat or prevent imminent or life-threatening deterioration. Critical care was time spent personally by me on the following activities: development of treatment plan  with patient and/or surrogate as well as nursing, discussions with consultants, evaluation of patient's response to treatment, examination of patient, obtaining history from patient or surrogate, ordering and performing treatments and interventions, ordering and review of laboratory studies, ordering and review of radiographic studies, pulse oximetry and re-evaluation of patient's condition.  MDM Rules/Calculators/A&P                          Obese , diabetic pt here from home with malodorous sores to both feet that are grossly infected.  On arrival, patient nontoxic-appearing but tachypneic and mildly tachycardic.  No hypotension or fever.  Evolving sepsis likely, sepsis orders initiated, labs, IVF's and IV rocephin and vancomycin.  Cultures obtained.   Patient has significant leukocytosis and blood sugar of 591.  hyperglycemia address with fluids, insulin and K+ replacement.  Likely AKI as well. Imaging of the bilateral feet, soft tissue gas seen on left w/o evidence for osteomyelitis. Right neg for gas or osteomyelitis.  CXR w/o acute finding. Pt will require admission and possible surgical consultation.    Discussed findings with [facility] hospitalist, Dr. Linda Hedges who agrees to admit.  Requesting MRIs of both feet as well.   Final Clinical Impression(s) / ED Diagnoses Final diagnoses:  Sepsis with acute renal failure without septic shock, due to unspecified organism, unspecified acute renal failure type ([place])  Cellulitis of lower extremity, unspecified laterality    Rx / DC Orders ED Discharge Orders    None       Bufford Lope 09/06/20 2023    Hayden Rasmussen, MD 09/07/20 [phone]

## 2020-09-06 NOTE — ED Notes (Signed)
Pt in MRI at this time. Medications paused as reflected in MAR until return to unit.

## 2020-09-06 NOTE — H&P (Signed)
History and Physical    Michelle Skinner QQV:[id number] DOB: 1965/10/09 DOA: 09/06/2020  PCP: Monico Blitz, MD (Confirm with patient/family/NH records and if not entered, this has to be entered at [facility] point of entry) Patient coming from: Home  I have personally briefly reviewed patient's old medical records in [place]  Chief Complaint: wounds both feet  HPI: Michelle Skinner is a 55 y.o. female with medical history significant of morbid obesity, DM2 on insulin w/ peripheral neuropathy, HTN, GERD. She reports that she incurred "bone bruises" to both feet several weeks ago which have progressed to open wounds. She had progressive weakness and marked pain in both feet. She presents to [facility] for evaluation.  ED Course: T 97.5  133/54  HR 108  RR 30  BMI 54.Cmet with Na 124, K 3.9, BUN 52, Cr 1.94 (baseline 1.3), lactic acid 2.1, WBC 25.4 w/ 88/3/3, INR 1.6. Code sepsis initiated 2/2 low temp, tachycardia, AKI, lactic acidosis with source of infection severe bilateral cellulitis involving both feet and possible osteomyelitis: patient received 2L LR, Cefriaxone given  and Vanomycin ordered and given. Patient also with hyperglycemia with serum glucose 591. Glycemic protocol initiated with insulin infusion, K replacement and standard titration orders. TRH called to admit for continue evaluation and treatment.   Review of Systems: As per HPI otherwise 10 point review of systems negative. Mild abdominal pain.    Past Medical History:  Diagnosis Date  . Anemia   . Cervical cancer ([place])   . Depression   . Diabetes mellitus, type II ([place])   . GERD (gastroesophageal reflux disease)   . Hyperlipidemia   . Neuropathy   . PTSD (post-traumatic stress disorder)     Past Surgical History:  Procedure Laterality Date  . ABDOMINAL HYSTERECTOMY    . COLONOSCOPY WITH PROPOFOL N/A 05/25/2018   Procedure: COLONOSCOPY WITH PROPOFOL;  Surgeon: Daneil Dolin, MD;  Location: [facility];  Service:  Endoscopy;  Laterality: N/A;  7:30am  . OTHER SURGICAL HISTORY Right    R Foot- I&D  . POLYPECTOMY  05/25/2018   Procedure: POLYPECTOMY;  Surgeon: Daneil Dolin, MD;  Location: [facility];  Service: Endoscopy;;  colon  . UMBILICAL HERNIA REPAIR  2007    Soc Hx - married. Has 1 son and 3 daughters, 4 grandchildren. She worked in [employer] until she retired on disability. Her husband is diabetic with ESRD-HD and just recently had BKA. They live alone. He has home health RN care twice a week. She helps him get to HD 3/wk.    reports that she has never smoked. She has never used smokeless tobacco. She reports that she does not drink alcohol and does not use drugs.  Allergies  Allergen Reactions  . Sulfa Antibiotics Rash  . Bactrim [Sulfamethoxazole-Trimethoprim] Itching  . Cherry Other (See Comments)    Unknown  . Gabapentin Other (See Comments)    Unknown  . Invokana [Canagliflozin] Other (See Comments)    Unknown  . Other     Hot peppers    Family History  Problem Relation Age of Onset  . Hypertension Mother   . Diabetes Father   . Hyperlipidemia Father   . CAD Father   . Stroke Father   . Osteoporosis Maternal Grandmother   . Cancer Maternal Grandmother   . Hypertension Maternal Grandmother   . Colon cancer Neg Hx      Prior to Admission medications   Medication Sig Start Date End Date  Taking? Authorizing Provider  acetaminophen (TYLENOL) 500 MG tablet Take 1,000 mg by mouth every 6 (six) hours as needed for mild pain, fever or headache.    [provider]  Ascorbic Acid (VITAMIN C PO) Take 1 tablet by mouth daily.    [provider]  atorvastatin (LIPITOR) 40 MG tablet Take 1 tablet (40 mg total) by mouth at bedtime. 07/12/20   Cassandria Anger, MD  Caffeine-Magnesium Salicylate (DIUREX PO) Take 200 mg by mouth daily.    [provider]  carvedilol (COREG) 25 MG tablet Take 1 tablet (25 mg total) by mouth 2 (two) times daily with a  meal. 08/28/20   [name], Marella Chimes, MD  Cholecalciferol (VITAMIN D3) 125 MCG (5000 UT) CAPS Take 1 capsule (5,000 Units total) by mouth daily. 05/13/18   Cassandria Anger, MD  Continuous Blood Gluc Receiver (FREESTYLE LIBRE 14 DAY READER) DEVI by Does not apply route 4 (four) times daily.    [provider]  Continuous Blood Gluc Receiver (FREESTYLE LIBRE 2 READER) DEVI As directed 03/06/20   Cassandria Anger, MD  Continuous Blood Gluc Sensor (FREESTYLE LIBRE 2 SENSOR) MISC 1 Piece by Does not apply route every 14 (fourteen) days. 03/06/20   Cassandria Anger, MD  doxycycline (ADOXA) 100 MG tablet Take 100 mg by mouth 2 (two) times daily. 10 day supply    [provider]  FLUoxetine (PROZAC) 40 MG capsule Take 40 mg by mouth daily.    [provider]  hydrALAZINE (APRESOLINE) 25 MG tablet Take 1 tablet (25 mg total) by mouth 2 (two) times daily. 05/04/20 05/04/21  Katsadouros, Vasilios, MD  insulin regular human CONCENTRATED (HUMULIN R U-500 KWIKPEN) 500 UNIT/ML kwikpen Inject 50-60 Units into the skin 3 (three) times daily with meals. 07/12/20   Cassandria Anger, MD  levothyroxine (SYNTHROID) 150 MCG tablet Take 1 tablet (150 mcg total) by mouth daily before breakfast. 07/12/20   [name], Marella Chimes, MD  Multiple Vitamin (MULTIVITAMIN WITH MINERALS) TABS tablet Take 1 tablet by mouth daily.    [provider]  neomycin-bacitracin-polymyxin (NEOSPORIN) ointment Apply 1 application topically every 12 (twelve) hours. To legs    [provider]  omeprazole (PRILOSEC) 20 MG capsule Take 1 capsule (20 mg total) by mouth 2 (two) times daily before a meal. For 10 days while on Pylera for H.pylori Patient not taking: Reported on 04/27/2020 03/05/18   [name], PA-C  pantoprazole (PROTONIX) 40 MG tablet TAKE ONE (1) [place] Patient taking differently: Take 40 mg by mouth daily.  12/01/18   Cassandria Anger, MD  Thiamine HCl  (VITAMIN B-1 PO) Take 1 tablet by mouth daily.    [provider]  VITAMIN A PO Take 1 tablet by mouth daily.    [provider]  Vitamin D, Ergocalciferol, (DRISDOL) 1.25 MG (50000 UNIT) CAPS capsule Take 1 capsule (50,000 Units total) by mouth every 7 (seven) days. Patient not taking: Reported on 04/27/2020 11/15/19   Cassandria Anger, MD  VITAMIN E PO Take 1 tablet by mouth daily.    [provider]    Physical Exam: Vitals:   09/06/20 1500 09/06/20 1515 09/06/20 1530 09/06/20 1600  BP: 130/76  139/75 (!) 133/54  Pulse:    (!) 108  Resp: (!) 28 (!) 27 (!) 28 (!) 30  Temp:      TempSrc:      SpO2:    98%  Weight:  Height:         Vitals:   09/06/20 1500 09/06/20 1515 09/06/20 1530 09/06/20 1600  BP: 130/76  139/75 (!) 133/54  Pulse:    (!) 108  Resp: (!) 28 (!) 27 (!) 28 (!) 30  Temp:      TempSrc:      SpO2:    98%  Weight:      Height:       General: morbidly obese woman in no acute distress Eyes: PERRL, lids and conjunctivae normal ENMT: Mucous membranes are moist. Posterior pharynx clear of any exudate or lesions. She has partial dentures upper and lower.  Neck: obese - hindering palpation Respiratory: clear to auscultation bilaterally, no wheezing, no crackles. Normal respiratory effort. No accessory muscle use.  Cardiovascular: Regular rate and rhythm, no murmurs / rubs / gallops. 4+ lower extremity edema.  pedal pulses cannot be palpated due to obesity. No carotid bruits.  Abdomen: morbidly obese,  no tenderness.  Obesity hinders palpation. Bowel sounds positive.  Musculoskeletal: no clubbing / cyanosis. No joint deformity upper  extremities. Hammer toe deformities both feet. Massive obesity/lymphedema. Good ROM UE, could not assess LE. no contractures. Poor muscle tone.  Skin: Marked erythema bilateral distal LE and feet. Very warm to touch  Desquamation of dermis plantar aspect both feet, multiple areas of skin breakdown and  purulent drainage involving whole foot. Area is very tender to touch. Neurologic: CN 2-12 grossly intact. Sensation decreased distal LE. Strength 5/5 UE, could not test LE.  Psychiatric: Normal judgment and insight. Alert and oriented x 3. Normal mood.     Labs on Admission: I have personally reviewed following labs and imaging studies  CBC: Recent Labs  Lab 09/06/20 1401  WBC 25.4*  NEUTROABS 22.1*  HGB 9.8*  HCT 31.6*  MCV 91.1  PLT 196*   Basic Metabolic Panel: Recent Labs  Lab 09/06/20 1401  NA 124*  K 3.9  CL 88*  CO2 10*  GLUCOSE 591*  BUN 52*  CREATININE 1.94*  CALCIUM 8.8*   GFR: Estimated Creatinine Clearance: 53.6 mL/min (A) (by C-G formula based on SCr of 1.94 mg/dL (H)). Liver Function Tests: Recent Labs  Lab 09/06/20 1401  AST 12*  ALT 14  ALKPHOS 120  BILITOT 2.4*  PROT 7.0  ALBUMIN 1.6*   No results for input(s): LIPASE, AMYLASE in the last 168 hours. No results for input(s): AMMONIA in the last 168 hours. Coagulation Profile: Recent Labs  Lab 09/06/20 1401  INR 1.6*   Cardiac Enzymes: No results for input(s): CKTOTAL, CKMB, CKMBINDEX, TROPONINI in the last 168 hours. BNP (last 3 results) No results for input(s): PROBNP in the last 8760 hours. HbA1C: No results for input(s): HGBA1C in the last 72 hours. CBG: Recent Labs  Lab 09/06/20 1625  GLUCAP 448*   Lipid Profile: No results for input(s): CHOL, HDL, LDLCALC, TRIG, CHOLHDL, LDLDIRECT in the last 72 hours. Thyroid Function Tests: No results for input(s): TSH, T4TOTAL, FREET4, T3FREE, THYROIDAB in the last 72 hours. Anemia Panel: No results for input(s): VITAMINB12, FOLATE, FERRITIN, TIBC, IRON, RETICCTPCT in the last 72 hours. Urine analysis:    Component Value Date/Time   COLORURINE YELLOW 09/06/2020 1401   APPEARANCEUR CLEAR 09/06/2020 1401   LABSPEC 1.017 09/06/2020 1401   PHURINE 5.0 09/06/2020 1401   GLUCOSEU >=500 (A) 09/06/2020 1401   HGBUR SMALL (A) 09/06/2020  1401   BILIRUBINUR NEGATIVE 09/06/2020 1401   KETONESUR 20 (A) 09/06/2020 1401   PROTEINUR 100 (A)  09/06/2020 1401   UROBILINOGEN 0.2 08/05/2008 1400   NITRITE NEGATIVE 09/06/2020 1401   LEUKOCYTESUR NEGATIVE 09/06/2020 1401    Radiological Exams on Admission: DG Chest Port 1 View  Result Date: 09/06/2020 CLINICAL DATA:  Ulcerations to bilateral feet, lower extremity swelling, generalized weakness. EXAM: PORTABLE CHEST 1 VIEW COMPARISON:  Chest x-ray dated 11/15/2019 FINDINGS: Borderline cardiomegaly, stable. Lungs are clear. No pleural effusion or pneumothorax is seen. Osseous structures about the chest are unremarkable. IMPRESSION: 1. No active disease. No evidence of pneumonia or pulmonary edema. 2. Stable borderline cardiomegaly. Electronically Signed   By: Franki Cabot M.D.   On: 09/06/2020 15:20   DG Foot Complete Left  Result Date: 09/06/2020 CLINICAL DATA:  Severe ulcerations to bilateral feet. Lower extremity swelling, generalized weakness. EXAM: LEFT FOOT - COMPLETE 3+ VIEW COMPARISON:  None. FINDINGS: Deformities of the fourth and fifth proximal phalanx, most likely chronic based on configuration and superimposed callus formation. No acute appearing fracture line or displaced fracture fragment. No destructive changes elsewhere within the LEFT foot to suggest osteomyelitis. Patchy lucencies within the soft tissues along the volar aspect of the LEFT midfoot, predominantly at the level of the tarsals and metatarsals, suspicious for soft tissue gas, presumably related to overlying soft tissue ulceration. Chronic degenerative spurring within the midfoot and hindfoot, mild to moderate in degree, most prominent at the talar beak. IMPRESSION: 1. Evidence of soft tissue gas underlying the LEFT midfoot, presumably related to overlying soft tissue ulceration, necrotizing fasciitis not excluded. 2. No acute appearing osseous abnormality. No destructive changes to confirm an associated osteomyelitis. 3.  Chronic appearing fractures/deformities of the fourth and fifth proximal phalanx. Electronically Signed   By: Franki Cabot M.D.   On: 09/06/2020 15:31   DG Foot Complete Right  Result Date: 09/06/2020 CLINICAL DATA:  Weakness, severe ulcerations to bilateral feet, lower extremity swelling. EXAM: RIGHT FOOT COMPLETE - 3+ VIEW COMPARISON:  None. FINDINGS: Osseous alignment is normal. No fracture line or displaced fracture fragment. No acute appearing cortical irregularity or osseous lesion. No destructive change is seen to confirm osteomyelitis. Degenerative spurring within the midfoot and hindfoot, mild to moderate in degree. Vascular calcifications. Presumed soft tissue edema. No evidence of soft tissue gas. IMPRESSION: 1. Presumed soft tissue edema. No evidence of soft tissue gas. 2. No acute appearing osseous abnormality. No evidence of osteomyelitis. Electronically Signed   By: Franki Cabot M.D.   On: 09/06/2020 15:22    EKG: Independently reviewed. Sinus Tachycardia, multifocal PVCs.   Assessment/Plan Active Problems:   Sepsis ([place])   Uncontrolled type 2 diabetes mellitus with complication, with long-term current use of insulin ([place])   Primary hypothyroidism   Mixed hyperlipidemia   Essential hypertension, benign   Depression   GERD (gastroesophageal reflux disease)   Cellulitis  (please populate well all problems here in Problem List. (For example, if patient is on BP meds at home and you resume or decide to hold them, it is a problem that needs to be her. Same for CAD, COPD, HLD and so on)   1. Severe Sepsis - 2/2 cellulitis involving feet with lactic acidosis, leukocytosis, AKI, tachycardia, tachypnea. Patient has received 2L IVF and abx were started. Plan Stepdown admission  2. Cellulitis - severe cellulitis both feet and possible osteomyelitis. Plan Stepdown admission  Ceftriaxone 2 g q 24,  Vancomycin  MRI both feet to r/o osteomyelitis  Wound care consult  May require  surgical debridement/drainage  - inpatient team to assess and consult surgery  or ortho as appropriate  3. DM - last A1C 07/03/20 10.1%. Sees Dr. Dorris Fetch for endocrinology as outpatient.  Presents with hyperglycemia/DKA 2/2 infection in setting of poor control with large anion gap, metabolic derangement, abdominal pain. Insulin infusion-DKA  protocol initiated by ED team.  Plan Continue protocol until glucose controlled  K replacement given  When DKA resolved will start basal insulin and follow sliding scale  4. HTN- stable - will continue home meds, including ARB as AKI is likely to resolve quickly with resuscitation.   5. HLD - continue statin therapy  6. Hypothyroidism - continue home dose of synthroid.   7. GERD - continue PPI  8. Depression - continue home meds  9. Disposition - TOC consult  DVT prophylaxis: lovenox (Lovenox/Heparin/SCD's/anticoagulated/None (if comfort care) Code Status -  full code(Full/Partial (specify details) Family Communication: Spoke with patients husband. He understands dx and tx plan. If she needs a Psychologist, sport and exercise he prefers Dr. Arnoldo Morale, general surgery.  Disposition Plan: TBD  Consults called: none (with names) Admission status: stepdown/inpatient    Adella Hare MD [facility] Pager [phone]  If 7PM-7AM, please contact night-coverage [email or web address] Password [facility]  09/06/2020, 5:03 PM

## 2020-09-06 NOTE — ED Triage Notes (Signed)
Pt to er, pt states that she has wounds on her feet that are bleeding and leaking fluid, states that she went to go see her doctor and he told her to go to the er for iv abx, pt has redness and swelling to bilateral lower extremities.

## 2020-09-07 ENCOUNTER — Inpatient Hospital Stay (HOSPITAL_COMMUNITY): Payer: Medicare Other

## 2020-09-07 DIAGNOSIS — I890 Lymphedema, not elsewhere classified: Secondary | ICD-10-CM | POA: Diagnosis not present

## 2020-09-07 DIAGNOSIS — L97529 Non-pressure chronic ulcer of other part of left foot with unspecified severity: Secondary | ICD-10-CM | POA: Diagnosis not present

## 2020-09-07 DIAGNOSIS — L03119 Cellulitis of unspecified part of limb: Secondary | ICD-10-CM | POA: Diagnosis not present

## 2020-09-07 DIAGNOSIS — E782 Mixed hyperlipidemia: Secondary | ICD-10-CM

## 2020-09-07 DIAGNOSIS — A419 Sepsis, unspecified organism: Secondary | ICD-10-CM | POA: Diagnosis not present

## 2020-09-07 DIAGNOSIS — F32A Depression, unspecified: Secondary | ICD-10-CM | POA: Diagnosis not present

## 2020-09-07 DIAGNOSIS — L97519 Non-pressure chronic ulcer of other part of right foot with unspecified severity: Secondary | ICD-10-CM

## 2020-09-07 DIAGNOSIS — E039 Hypothyroidism, unspecified: Secondary | ICD-10-CM

## 2020-09-07 DIAGNOSIS — E1110 Type 2 diabetes mellitus with ketoacidosis without coma: Secondary | ICD-10-CM

## 2020-09-07 DIAGNOSIS — K219 Gastro-esophageal reflux disease without esophagitis: Secondary | ICD-10-CM | POA: Diagnosis not present

## 2020-09-07 LAB — CBC
HCT: 27.3 % — ABNORMAL LOW (ref 36.0–46.0)
Hemoglobin: 8.8 g/dL — ABNORMAL LOW (ref 12.0–15.0)
MCH: 28 pg (ref 26.0–34.0)
MCHC: 32.2 g/dL (ref 30.0–36.0)
MCV: 86.9 fL (ref 80.0–100.0)
Platelets: 353 10*3/uL (ref 150–400)
RBC: 3.14 MIL/uL — ABNORMAL LOW (ref 3.87–5.11)
RDW: 16.9 % — ABNORMAL HIGH (ref 11.5–15.5)
WBC: 21.2 10*3/uL — ABNORMAL HIGH (ref 4.0–10.5)
nRBC: 0 % (ref 0.0–0.2)

## 2020-09-07 LAB — GLUCOSE, CAPILLARY
Glucose-Capillary: 211 mg/dL — ABNORMAL HIGH (ref 70–99)
Glucose-Capillary: 191 mg/dL — ABNORMAL HIGH (ref 70–99)
Glucose-Capillary: 176 mg/dL — ABNORMAL HIGH (ref 70–99)
Glucose-Capillary: 176 mg/dL — ABNORMAL HIGH (ref 70–99)
Glucose-Capillary: 197 mg/dL — ABNORMAL HIGH (ref 70–99)
Glucose-Capillary: 200 mg/dL — ABNORMAL HIGH (ref 70–99)
Glucose-Capillary: 216 mg/dL — ABNORMAL HIGH (ref 70–99)
Glucose-Capillary: 191 mg/dL — ABNORMAL HIGH (ref 70–99)
Glucose-Capillary: 193 mg/dL — ABNORMAL HIGH (ref 70–99)
Glucose-Capillary: 219 mg/dL — ABNORMAL HIGH (ref 70–99)
Glucose-Capillary: 186 mg/dL — ABNORMAL HIGH (ref 70–99)
Glucose-Capillary: 188 mg/dL — ABNORMAL HIGH (ref 70–99)
Glucose-Capillary: 208 mg/dL — ABNORMAL HIGH (ref 70–99)
Glucose-Capillary: 228 mg/dL — ABNORMAL HIGH (ref 70–99)
Glucose-Capillary: 250 mg/dL — ABNORMAL HIGH (ref 70–99)

## 2020-09-07 LAB — BASIC METABOLIC PANEL
Anion gap: 11 (ref 5–15)
Anion gap: 12 (ref 5–15)
BUN: 52 mg/dL — ABNORMAL HIGH (ref 6–20)
BUN: 51 mg/dL — ABNORMAL HIGH (ref 6–20)
CO2: 21 mmol/L — ABNORMAL LOW (ref 22–32)
CO2: 22 mmol/L (ref 22–32)
Calcium: 7.7 mg/dL — ABNORMAL LOW (ref 8.9–10.3)
Calcium: 7.8 mg/dL — ABNORMAL LOW (ref 8.9–10.3)
Chloride: 99 mmol/L (ref 98–111)
Chloride: 98 mmol/L (ref 98–111)
Creatinine, Ser: 1.81 mg/dL — ABNORMAL HIGH (ref 0.44–1.00)
Creatinine, Ser: 1.79 mg/dL — ABNORMAL HIGH (ref 0.44–1.00)
GFR, Estimated: 33 mL/min — ABNORMAL LOW (ref >60)
GFR, Estimated: 33 mL/min — ABNORMAL LOW (ref >60)
Glucose, Bld: 221 mg/dL — ABNORMAL HIGH (ref 70–99)
Glucose, Bld: 192 mg/dL — ABNORMAL HIGH (ref 70–99)
Potassium: 3.4 mmol/L — ABNORMAL LOW (ref 3.5–5.1)
Potassium: 3.3 mmol/L — ABNORMAL LOW (ref 3.5–5.1)
Sodium: 131 mmol/L — ABNORMAL LOW (ref 135–145)
Sodium: 132 mmol/L — ABNORMAL LOW (ref 135–145)

## 2020-09-07 LAB — BLOOD CULTURE ID PANEL (REFLEXED) - BCID2

## 2020-09-07 LAB — PROTIME-INR
INR: 1.4 — ABNORMAL HIGH (ref 0.8–1.2)
Prothrombin Time: 16.3 seconds — ABNORMAL HIGH (ref 11.4–15.2)

## 2020-09-07 LAB — BETA-HYDROXYBUTYRIC ACID
Beta-Hydroxybutyric Acid: 4.51 mmol/L — ABNORMAL HIGH (ref 0.05–0.27)
Beta-Hydroxybutyric Acid: 1.06 mmol/L — ABNORMAL HIGH (ref 0.05–0.27)

## 2020-09-07 LAB — MRSA PCR SCREENING: MRSA by PCR: NEGATIVE

## 2020-09-07 LAB — CORTISOL-AM, BLOOD: Cortisol - AM: 24.4 ug/dL — ABNORMAL HIGH (ref 6.7–22.6)

## 2020-09-07 MED ORDER — MORPHINE SULFATE (PF) 2 MG/ML IV SOLN
2.0000 mg | Freq: Once | INTRAVENOUS | Status: AC
  Administered 2020-09-07: 2 mg via INTRAVENOUS
  Filled 2020-09-07: qty 1

## 2020-09-07 MED ORDER — SODIUM CHLORIDE 0.9 % IV SOLN
250.0000 mL | INTRAVENOUS | Status: DC
  Administered 2020-09-09: 250 mL via INTRAVENOUS

## 2020-09-07 MED ORDER — SODIUM CHLORIDE 0.9 % IV SOLN: INTRAVENOUS | Status: AC

## 2020-09-07 MED ORDER — NOREPINEPHRINE 4 MG/250ML-% IV SOLN: 0.0000 ug/min | INTRAVENOUS | Status: DC

## 2020-09-07 MED ORDER — SODIUM CHLORIDE 0.9 % IV SOLN: INTRAVENOUS | Status: DC

## 2020-09-07 MED ORDER — CARVEDILOL 12.5 MG PO TABS
12.5000 mg | ORAL_TABLET | Freq: Two times a day (BID) | ORAL | Status: DC
  Administered 2020-09-08 – 2020-09-11 (×5): 12.5 mg via ORAL
  Filled 2020-09-07 (×6): qty 1

## 2020-09-07 MED ORDER — NOREPINEPHRINE 4 MG/250ML-% IV SOLN: 2.0000 ug/min | INTRAVENOUS | Status: DC

## 2020-09-07 MED ORDER — IOHEXOL 350 MG/ML SOLN
125.0000 mL | Freq: Once | INTRAVENOUS | Status: AC | PRN
  Administered 2020-09-07: 125 mL via INTRAVENOUS

## 2020-09-07 MED ORDER — CHLORHEXIDINE GLUCONATE CLOTH 2 % EX PADS
6.0000 | MEDICATED_PAD | Freq: Once | CUTANEOUS | Status: AC
  Administered 2020-09-07: 6 via TOPICAL

## 2020-09-07 MED ORDER — CHLORHEXIDINE GLUCONATE CLOTH 2 % EX PADS
6.0000 | MEDICATED_PAD | Freq: Every day | CUTANEOUS | Status: DC
  Administered 2020-09-07 – 2020-09-15 (×7): 6 via TOPICAL

## 2020-09-07 MED ORDER — INSULIN ASPART 100 UNIT/ML ~~LOC~~ SOLN
7.0000 [IU] | Freq: Three times a day (TID) | SUBCUTANEOUS | Status: DC
  Administered 2020-09-07 – 2020-09-09 (×4): 7 [IU] via SUBCUTANEOUS

## 2020-09-07 MED ORDER — ENOXAPARIN SODIUM 80 MG/0.8ML ~~LOC~~ SOLN
70.0000 mg | SUBCUTANEOUS | Status: DC
  Administered 2020-09-07 – 2020-09-12 (×6): 70 mg via SUBCUTANEOUS
  Filled 2020-09-07: qty 0.7
  Filled 2020-09-07: qty 0.8
  Filled 2020-09-07: qty 0.7
  Filled 2020-09-07: qty 0.8
  Filled 2020-09-07 (×2): qty 0.7

## 2020-09-07 MED ORDER — SODIUM CHLORIDE 0.9 % IV BOLUS
500.0000 mL | Freq: Once | INTRAVENOUS | Status: AC
  Administered 2020-09-07: 500 mL via INTRAVENOUS

## 2020-09-07 MED ORDER — TRAMADOL HCL 50 MG PO TABS
100.0000 mg | ORAL_TABLET | Freq: Three times a day (TID) | ORAL | Status: DC | PRN
Start: 2020-09-07 — End: 2020-09-10
  Administered 2020-09-07 – 2020-09-09 (×2): 100 mg via ORAL
  Filled 2020-09-07 (×2): qty 2

## 2020-09-07 MED ORDER — CHLORHEXIDINE GLUCONATE CLOTH 2 % EX PADS
6.0000 | MEDICATED_PAD | Freq: Once | CUTANEOUS | Status: AC
  Administered 2020-09-08: 6 via TOPICAL

## 2020-09-07 MED ORDER — INSULIN GLARGINE 100 UNIT/ML ~~LOC~~ SOLN
25.0000 [IU] | Freq: Two times a day (BID) | SUBCUTANEOUS | Status: DC
  Administered 2020-09-07 – 2020-09-10 (×7): 25 [IU] via SUBCUTANEOUS
  Filled 2020-09-07 (×12): qty 0.25

## 2020-09-07 MED ORDER — POTASSIUM CHLORIDE CRYS ER 20 MEQ PO TBCR
40.0000 meq | EXTENDED_RELEASE_TABLET | Freq: Once | ORAL | Status: AC
  Administered 2020-09-07: 40 meq via ORAL
  Filled 2020-09-07: qty 2

## 2020-09-07 NOTE — Progress Notes (Addendum)
Inpatient Diabetes Program Recommendations  AACE/ADA: New Consensus Statement on Inpatient Glycemic Control (2015)  Target Ranges:  Prepandial:   less than 140 mg/dL      Peak postprandial:   less than 180 mg/dL (1-2 hours)      Critically ill patients:  140 - 180 mg/dL   Lab Results  Component Value Date   GLUCAP 219 (H) 09/07/2020   HGBA1C 10.1 (H) 07/03/2020    Review of Glycemic Control Results for LIBBIE, BARTLEY (MRN [id number]) as of 09/07/2020 09:56  Ref. Range 09/07/2020 05:01 09/07/2020 05:59 09/07/2020 06:57 09/07/2020 08:20 09/07/2020 09:40  Glucose-Capillary Latest Ref Range: 70 - 99 mg/dL 200 (H) 216 (H) 191 (H) 193 (H) 219 (H)   Diabetes history: DM 2 Outpatient Diabetes medications:  U500 insulin 50-60 units tid with meals Current orders for Inpatient glycemic control:  Lantus 20 units daily IV insulin- Novolog resistant tid with meals Inpatient Diabetes Program Recommendations:    Patient is on U500 insulin at home however with NPO status do not recommend U500 insulin at this time.   Note patient is on IV insulin and insulin drip rates around 4 units/hr.  Please consider transition using Levemir 25 units bid and Novolog resistant q 4 hours. Once patient is eating, patient may be able to resume U500 insulin?   Thanks  Adah Perl, RN, BC-ADM Inpatient Diabetes Coordinator Pager [phone] (8a-5p)

## 2020-09-07 NOTE — Progress Notes (Signed)
Patient's BP progressively getting softer. Was in the 90s sys now dropping into 80s. MD notified

## 2020-09-07 NOTE — Consult Note (Signed)
[place]   Reason for Consult: Bilateral plantar surface foot ulcers, lymphedema  Referring Physician:  Dr. Dyann Kief   Chief Complaint    Foot Pain      HPI: Michelle Skinner is a 55 y.o. female with lymphedema who says she has been using a lymphedema machine for a few weeks and that this improved her legs some. She says that about 2 weeks ago she stepped on a rock with her right foot barefooted and caused a blister. She came into the hospital with bilateral lower extremity cellulitis and blistering/ ulceration of there plantar surface of her feet. She has been walking on them and she is the only care giver of her husband who is now a bilateral amputate.  She reports having 4 children but does not think they will be helpful with her care.   Past Medical History:  Diagnosis Date  . Anemia   . Cervical cancer ([place])   . Depression   . Diabetes mellitus, type II ([place])   . GERD (gastroesophageal reflux disease)   . Hyperlipidemia   . Neuropathy   . PTSD (post-traumatic stress disorder)     Past Surgical History:  Procedure Laterality Date  . ABDOMINAL HYSTERECTOMY    . COLONOSCOPY WITH PROPOFOL N/A 05/25/2018   Procedure: COLONOSCOPY WITH PROPOFOL;  Surgeon: Daneil Dolin, MD;  [facility];  Service: Endoscopy;  Laterality: N/A;  7:30am  . OTHER SURGICAL HISTORY Right    R Foot- I&D  . POLYPECTOMY  05/25/2018   Procedure: POLYPECTOMY;  Surgeon: Daneil Dolin, MD;  Location: [facility];  Service: Endoscopy;;  colon  . UMBILICAL HERNIA REPAIR  2007    Family History  Problem Relation Age of Onset  . Hypertension Mother   . Diabetes Father   . Hyperlipidemia Father   . CAD Father   . Stroke Father   . Osteoporosis Maternal Grandmother   . Cancer Maternal Grandmother   . Hypertension Maternal Grandmother   . Colon cancer Neg Hx     Social History   Tobacco Use  . Smoking status: Never Smoker  . Smokeless tobacco: Never Used   Vaping Use  . Vaping Use: Never used  Substance Use Topics  . Alcohol use: No    Alcohol/week: 0.0 standard drinks  . Drug use: No    Medications:  I have reviewed the patient's current medications. Prior to Admission:  Medications Prior to Admission  Medication Sig Dispense Refill Last Dose  . acetaminophen (TYLENOL) 500 MG tablet Take 1,000 mg by mouth every 6 (six) hours as needed for mild pain, fever or headache.     . Ascorbic Acid (VITAMIN C PO) Take 1 tablet by mouth daily.     Marland Kitchen atorvastatin (LIPITOR) 40 MG tablet Take 1 tablet (40 mg total) by mouth at bedtime. 90 tablet 1   . Caffeine-Magnesium Salicylate (DIUREX PO) Take 200 mg by mouth daily.     . carvedilol (COREG) 25 MG tablet Take 1 tablet (25 mg total) by mouth 2 (two) times daily with a meal. 180 tablet 1   . Cholecalciferol (VITAMIN D3) 125 MCG (5000 UT) CAPS Take 1 capsule (5,000 Units total) by mouth daily. 90 capsule 0   . Continuous Blood Gluc Receiver (FREESTYLE LIBRE 14 DAY READER) DEVI by Does not apply route 4 (four) times daily.     . Continuous Blood Gluc Receiver (FREESTYLE LIBRE 2 READER) DEVI As directed 1 each 0   .  Continuous Blood Gluc Sensor (FREESTYLE LIBRE 2 SENSOR) MISC 1 Piece by Does not apply route every 14 (fourteen) days. 2 each 3   . doxycycline (ADOXA) 100 MG tablet Take 100 mg by mouth 2 (two) times daily. 10 day supply     . FLUoxetine (PROZAC) 40 MG capsule Take 40 mg by mouth daily.     . hydrALAZINE (APRESOLINE) 25 MG tablet Take 1 tablet (25 mg total) by mouth 2 (two) times daily. 60 tablet 0   . insulin regular human CONCENTRATED (HUMULIN R U-500 KWIKPEN) 500 UNIT/ML kwikpen Inject 50-60 Units into the skin 3 (three) times daily with meals. 12 mL 2   . levothyroxine (SYNTHROID) 150 MCG tablet Take 1 tablet (150 mcg total) by mouth daily before breakfast. 90 tablet 1   . Multiple Vitamin (MULTIVITAMIN WITH MINERALS) TABS tablet Take 1 tablet by mouth daily.     Marland Kitchen  neomycin-bacitracin-polymyxin (NEOSPORIN) ointment Apply 1 application topically every 12 (twelve) hours. To legs     . omeprazole (PRILOSEC) 20 MG capsule Take 1 capsule (20 mg total) by mouth 2 (two) times daily before a meal. For 10 days while on Pylera for H.pylori (Patient not taking: Reported on 04/27/2020) 20 capsule 0   . pantoprazole (PROTONIX) 40 MG tablet TAKE ONE (1) TABLET EACH DAY (Patient taking differently: Take 40 mg by mouth daily. ) 90 tablet 0   . Thiamine HCl (VITAMIN B-1 PO) Take 1 tablet by mouth daily.     Marland Kitchen VITAMIN A PO Take 1 tablet by mouth daily.     . Vitamin D, Ergocalciferol, (DRISDOL) 1.25 MG (50000 UNIT) CAPS capsule Take 1 capsule (50,000 Units total) by mouth every 7 (seven) days. (Patient not taking: Reported on 04/27/2020) 12 capsule 0   . VITAMIN E PO Take 1 tablet by mouth daily.      Scheduled: . atorvastatin  40 mg Oral QHS  . [START ON 09/08/2020] carvedilol  12.5 mg Oral BID WC  . Chlorhexidine Gluconate Cloth  6 each Topical Daily  . Chlorhexidine Gluconate Cloth  6 each Topical Once   And  . Chlorhexidine Gluconate Cloth  6 each Topical Once  . cholecalciferol  5,000 Units Oral Daily  . enoxaparin (LOVENOX) injection  70 mg Subcutaneous Q24H  . FLUoxetine  40 mg Oral Daily  . insulin aspart  0-20 Units Subcutaneous TID WC  . insulin aspart  7 Units Subcutaneous TID WC  . insulin glargine  25 Units Subcutaneous BID  . levothyroxine  150 mcg Oral QAC breakfast  . magnesium oxide  200 mg Oral Daily  . pantoprazole  40 mg Oral Daily  . senna  1 tablet Oral BID   Continuous: . sodium chloride Stopped (09/07/20 0710)  . sodium chloride 75 mL/hr at 09/07/20 1534  . cefTRIAXone (ROCEPHIN)  IV 2 g (09/07/20 1422)  . vancomycin 1,750 mg (09/07/20 1623)   RSW:[id number], dextrose, traMADol, traZODone  Allergies  Allergen Reactions  . Sulfa Antibiotics Rash  . Bactrim [Sulfamethoxazole-Trimethoprim] Itching  . Cherry Other (See Comments)     Unknown  . Gabapentin Other (See Comments)    Unknown  . Invokana [Canagliflozin] Other (See Comments)    Unknown  . Other     Hot peppers     ROS:  A comprehensive review of systems was negative except for: Musculoskeletal: positive for lypmhedema, cellulitis and ulcer of bilateral lower extremity  Blood pressure (!) 110/51, pulse (!) 26, temperature 97.7 F (36.5 C), temperature source  Oral, resp. rate 17, height 5' 7.5" (1.715 m), weight (!) 148.1 kg, SpO2 97 %. Physical Exam Vitals reviewed.  Constitutional:      Appearance: She is obese.  HENT:     Head: Normocephalic.     Nose: Nose normal.  Eyes:     Extraocular Movements: Extraocular movements intact.  Cardiovascular:     Rate and Rhythm: Normal rate.  Pulmonary:     Effort: Pulmonary effort is normal.  Abdominal:     General: There is no distension.     Palpations: Abdomen is soft.  Musculoskeletal:     Comments: Significant lymphedema bilateral lower extremity, no palpable pulses in the feet, ankles and feet with cellulitis, extensive ulceration/ blistering of the plantar surface of each foot with necrotic tissue, left foot with some drainage laterally   Skin:    General: Skin is warm.  Neurological:     General: No focal deficit present.     Mental Status: She is oriented to person, place, and time.  Psychiatric:        Mood and Affect: Mood normal.        Behavior: Behavior normal.             Results: Results for orders placed or performed during the hospital encounter of 09/06/20 (from the past 48 hour(s))  Resp Panel by RT-PCR (Flu A&B, Covid) Nasopharyngeal Swab     Status: None   Collection Time: 09/06/20  2:01 PM   Specimen: Nasopharyngeal Swab; Nasopharyngeal(NP) swabs in vial transport medium  Result Value Ref Range   SARS Coronavirus 2 by RT PCR NEGATIVE NEGATIVE    Comment: (NOTE) SARS-CoV-2 target nucleic acids are NOT DETECTED.  The SARS-CoV-2 RNA is generally detectable in upper  respiratory specimens during the acute phase of infection. The lowest concentration of SARS-CoV-2 viral copies this assay can detect is 138 copies/mL. A negative result does not preclude SARS-Cov-2 infection and should not be used as the sole basis for treatment or other patient management decisions. A negative result may occur with  improper specimen collection/handling, submission of specimen other than nasopharyngeal swab, presence of viral mutation(s) within the areas targeted by this assay, and inadequate number of viral copies(<138 copies/mL). A negative result must be combined with clinical observations, patient history, and epidemiological information. The expected result is Negative.  Fact Sheet for Patients:  EntrepreneurPulse.com.au  Fact Sheet for Healthcare Providers:  IncredibleEmployment.be  This test is no t yet approved or cleared by the [place] FDA and  has been authorized for detection and/or diagnosis of SARS-CoV-2 by FDA under an Emergency Use Authorization (EUA). This EUA will remain  in effect (meaning this test can be used) for the duration of the COVID-19 declaration under Section 564(b)(1) of the Act, 21 U.S.C.section 360bbb-3(b)(1), unless the authorization is terminated  or revoked sooner.       Influenza A by PCR NEGATIVE NEGATIVE   Influenza B by PCR NEGATIVE NEGATIVE    Comment: (NOTE) The Xpert Xpress SARS-CoV-2/FLU/RSV plus assay is intended as an aid in the diagnosis of influenza from Nasopharyngeal swab specimens and should not be used as a sole basis for treatment. Nasal washings and aspirates are unacceptable for Xpert Xpress SARS-CoV-2/FLU/RSV testing.  Fact Sheet for Patients: EntrepreneurPulse.com.au  Fact Sheet for Healthcare Providers: IncredibleEmployment.be  This test is not yet approved or cleared by the [place] FDA and has been authorized for  detection and/or diagnosis of SARS-CoV-2 by FDA under an Emergency Use Authorization (  EUA). This EUA will remain in effect (meaning this test can be used) for the duration of the COVID-19 declaration under Section 564(b)(1) of the Act, 21 U.S.C. section 360bbb-3(b)(1), unless the authorization is terminated or revoked.  Performed at [facility], [address] [place], [place] [address]   Lactic acid, plasma     Status: Abnormal   Collection Time: 09/06/20  2:01 PM  Result Value Ref Range   Lactic Acid, Venous 2.1 (HH) 0.5 - 1.9 mmol/L    Comment: CRITICAL RESULT CALLED TO, READ BACK BY AND VERIFIED WITH: MCCARTNEY,S ON 09/06/20 AT 1500 BY LOY,C Performed at [facility] [place], [place] [address]   Comprehensive metabolic panel     Status: Abnormal   Collection Time: 09/06/20  2:01 PM  Result Value Ref Range   Sodium 124 (L) 135 - 145 mmol/L   Potassium 3.9 3.5 - 5.1 mmol/L   Chloride 88 (L) 98 - 111 mmol/L   CO2 10 (L) 22 - 32 mmol/L   Glucose, Bld 591 (HH) 70 - 99 mg/dL    Comment: Glucose reference range applies only to samples taken after fasting for at least 8 hours. CRITICAL RESULT CALLED TO, READ BACK BY AND VERIFIED WITH: MCCARTNEY,S ON 09/06/20 AT 1510 BY LOY,C    BUN 52 (H) 6 - 20 mg/dL   Creatinine, Ser 1.94 (H) 0.44 - 1.00 mg/dL   Calcium 8.8 (L) 8.9 - 10.3 mg/dL   Total Protein 7.0 6.5 - 8.1 g/dL   Albumin 1.6 (L) 3.5 - 5.0 g/dL   AST 12 (L) 15 - 41 U/L   ALT 14 0 - 44 U/L   Alkaline Phosphatase 120 38 - 126 U/L   Total Bilirubin 2.4 (H) 0.3 - 1.2 mg/dL   GFR, Estimated 30 (L) >60 mL/min    Comment: (NOTE) Calculated using the CKD-EPI Creatinine Equation (2021)    Anion gap 26 (H) 5 - 15    Comment: REPEATED TO VERIFY Performed at [facility], [address] [place], [place] [address]   CBC WITH DIFFERENTIAL     Status: Abnormal   Collection Time: 09/06/20  2:01 PM  Result Value Ref Range   WBC 25.4 (H) 4.0 - 10.5 K/uL   RBC 3.47 (L) 3.87  - 5.11 MIL/uL   Hemoglobin 9.8 (L) 12.0 - 15.0 g/dL   HCT 31.6 (L) 36.0 - 46.0 %   MCV 91.1 80.0 - 100.0 fL   MCH 28.2 26.0 - 34.0 pg   MCHC 31.0 30.0 - 36.0 g/dL   RDW 17.2 (H) 11.5 - 15.5 %   Platelets 402 (H) 150 - 400 K/uL   nRBC 0.0 0.0 - 0.2 %   Neutrophils Relative % 88 %   Neutro Abs 22.1 (H) 1.7 - 7.7 K/uL   Lymphocytes Relative 3 %   Lymphs Abs 0.9 0.7 - 4.0 K/uL   Monocytes Relative 3 %   Monocytes Absolute 0.8 0.1 - 1.0 K/uL   Eosinophils Relative 0 %   Eosinophils Absolute 0.0 0.0 - 0.5 K/uL   Basophils Relative 0 %   Basophils Absolute 0.0 0.0 - 0.1 K/uL   WBC Morphology MILD LEFT SHIFT (1-5% METAS, OCC MYELO, OCC BANDS)     Comment: TOXIC GRANULATION   Immature Granulocytes 6 %   Abs Immature Granulocytes 1.62 (H) 0.00 - 0.07 K/uL    Comment: Performed at [facility], [address] [place], [place] [address]  Protime-INR     Status: Abnormal  Collection Time: 09/06/20  2:01 PM  Result Value Ref Range   Prothrombin Time 18.5 (H) 11.4 - 15.2 seconds   INR 1.6 (H) 0.8 - 1.2    Comment: (NOTE) INR goal varies based on device and disease states. Performed at [facility], [address] [place], [place] [address]   APTT     Status: None   Collection Time: 09/06/20  2:01 PM  Result Value Ref Range   aPTT 30 24 - 36 seconds    Comment: Performed at [facility], [address], [place], [place] [address]  Urinalysis, Routine w reflex microscopic     Status: Abnormal   Collection Time: 09/06/20  2:01 PM  Result Value Ref Range   Color, Urine YELLOW YELLOW   APPearance CLEAR CLEAR   Specific Gravity, Urine 1.017 1.005 - 1.030   pH 5.0 5.0 - 8.0   Glucose, UA >=500 (A) NEGATIVE mg/dL   Hgb urine dipstick SMALL (A) NEGATIVE   Bilirubin Urine NEGATIVE NEGATIVE   Ketones, ur 20 (A) NEGATIVE mg/dL   Protein, ur 100 (A) NEGATIVE mg/dL   Nitrite NEGATIVE NEGATIVE   Leukocytes,Ua NEGATIVE NEGATIVE   RBC / HPF 0-5 0 - 5 RBC/hpf   WBC, UA 11-20 0 - 5 WBC/hpf    Bacteria, UA RARE (A) NONE SEEN   Squamous Epithelial / LPF 0-5 0 - 5   Mucus PRESENT    Hyaline Casts, UA PRESENT     Comment: Performed at [facility], [address], [place], [place] [address]  Blood Culture (routine x 2)     Status: None (Preliminary result)   Collection Time: 09/06/20  2:14 PM   Specimen: BLOOD  Result Value Ref Range   Specimen Description      BLOOD LEFT ANTECUBITAL Performed at [facility], [address], [place], [place] [address]    Special Requests      BOTTLES DRAWN AEROBIC AND ANAEROBIC Blood Culture adequate volume Performed at [facility], [address] [place], [place] [address]    Culture  Setup Time      GRAM POSITIVE COCCI IN BOTH AEROBIC AND ANAEROBIC BOTTLES Gram Stain Report Called to,Read Back By and Verified With: LARIMORE,A@[email or web address]  BY MATTHEWS, B 3.3.2022 Performed at [place] ID to follow CRITICAL RESULT CALLED TO, READ BACK BY AND VERIFIED WITHMartin Majestic RN [id number] 09/07/20 A BROWNING Performed at [facility], [place] [address], [place], [place] [address]    Culture PENDING    Report Status PENDING   Blood Culture ID Panel (Reflexed)     Status: Abnormal   Collection Time: 09/06/20  2:14 PM  Result Value Ref Range   Enterococcus faecalis NOT DETECTED NOT DETECTED   Enterococcus Faecium NOT DETECTED NOT DETECTED   Listeria monocytogenes NOT DETECTED NOT DETECTED   Staphylococcus species DETECTED (A) NOT DETECTED    Comment: CRITICAL RESULT CALLED TO, READ BACK BY AND VERIFIED WITH: Martin Majestic RN [id number] 09/07/20 A BROWNING    Staphylococcus aureus (BCID) DETECTED (A) NOT DETECTED    Comment: CRITICAL RESULT CALLED TO, READ BACK BY AND VERIFIED WITHMartin Majestic RN [id number] 09/07/20 A BROWNING    Staphylococcus epidermidis NOT DETECTED NOT DETECTED   Staphylococcus lugdunensis NOT DETECTED NOT DETECTED   Streptococcus species NOT DETECTED NOT DETECTED   Streptococcus agalactiae NOT DETECTED NOT DETECTED   Streptococcus  pneumoniae NOT DETECTED NOT DETECTED   Streptococcus pyogenes NOT DETECTED NOT DETECTED   A.calcoaceticus-baumannii NOT DETECTED NOT  DETECTED   Bacteroides fragilis NOT DETECTED NOT DETECTED   Enterobacterales NOT DETECTED NOT DETECTED   Enterobacter cloacae complex NOT DETECTED NOT DETECTED   Escherichia coli NOT DETECTED NOT DETECTED   Klebsiella aerogenes NOT DETECTED NOT DETECTED   Klebsiella oxytoca NOT DETECTED NOT DETECTED   Klebsiella pneumoniae NOT DETECTED NOT DETECTED   Proteus species NOT DETECTED NOT DETECTED   Salmonella species NOT DETECTED NOT DETECTED   Serratia marcescens NOT DETECTED NOT DETECTED   Haemophilus influenzae NOT DETECTED NOT DETECTED   Neisseria meningitidis NOT DETECTED NOT DETECTED   Pseudomonas aeruginosa NOT DETECTED NOT DETECTED   Stenotrophomonas maltophilia NOT DETECTED NOT DETECTED   Candida albicans NOT DETECTED NOT DETECTED   Candida auris NOT DETECTED NOT DETECTED   Candida glabrata NOT DETECTED NOT DETECTED   Candida krusei NOT DETECTED NOT DETECTED   Candida parapsilosis NOT DETECTED NOT DETECTED   Candida tropicalis NOT DETECTED NOT DETECTED   Cryptococcus neoformans/gattii NOT DETECTED NOT DETECTED   Meth resistant mecA/C and MREJ NOT DETECTED NOT DETECTED    Comment: Performed at [facility] [address] [place], [place] [address]  Lactic acid, plasma     Status: Abnormal   Collection Time: 09/06/20  3:59 PM  Result Value Ref Range   Lactic Acid, Venous 2.2 (HH) 0.5 - 1.9 mmol/L    Comment: CRITICAL VALUE NOTED.  VALUE IS CONSISTENT WITH PREVIOUSLY REPORTED AND CALLED VALUE. Performed at [facility], [address], [place], [place] [address]   Blood Culture (routine x 2)     Status: None (Preliminary result)   Collection Time: 09/06/20  3:59 PM   Specimen: BLOOD RIGHT HAND  Result Value Ref Range   Specimen Description BLOOD RIGHT HAND    Special Requests      BOTTLES DRAWN AEROBIC AND ANAEROBIC Blood Culture adequate  volume   Culture      NO GROWTH < 24 HOURS Performed at [facility], [address], [place], [place] [address]    Report Status PENDING   Beta-hydroxybutyric acid     Status: Abnormal   Collection Time: 09/06/20  3:59 PM  Result Value Ref Range   Beta-Hydroxybutyric Acid >8.00 (H) 0.05 - 0.27 mmol/L    Comment: RESULTS CONFIRMED BY MANUAL DILUTION Performed at [facility], [address] [place], [place] [address]   Blood gas, venous     Status: Abnormal   Collection Time: 09/06/20  3:59 PM  Result Value Ref Range   FIO2 21.00    pH, Ven 7.229 (L) 7.250 - 7.430   pCO2, Ven 26.6 (L) 44.0 - 60.0 mmHg   pO2, Ven 36.2 32.0 - 45.0 mmHg   Bicarbonate 12.2 (L) 20.0 - 28.0 mmol/L   Acid-base deficit 15.4 (H) 0.0 - 2.0 mmol/L   O2 Saturation 57.0 %   Patient temperature 36.4     Comment: Performed at [facility], [address], [place], [place] [address]  HIV Antibody (routine testing w rflx)     Status: None   Collection Time: 09/06/20  3:59 PM  Result Value Ref Range   HIV Screen 4th Generation wRfx Non Reactive Non Reactive    Comment: Performed at [facility], [place] [address], [place], [place] [address]  CBG monitoring, ED     Status: Abnormal   Collection Time: 09/06/20  4:25 PM  Result Value Ref Range   Glucose-Capillary 448 (H) 70 - 99 mg/dL    Comment: Glucose reference range applies  only to samples taken after fasting for at least 8 hours.   Comment 1 Notify RN   CBG monitoring, ED     Status: Abnormal   Collection Time: 09/06/20  5:50 PM  Result Value Ref Range   Glucose-Capillary 308 (H) 70 - 99 mg/dL    Comment: Glucose reference range applies only to samples taken after fasting for at least 8 hours.  CBG monitoring, ED     Status: Abnormal   Collection Time: 09/06/20  8:17 PM  Result Value Ref Range   Glucose-Capillary 329 (H) 70 - 99 mg/dL    Comment: Glucose reference range applies only to samples taken after fasting for at least 8 hours.  MRSA PCR  Screening     Status: None   Collection Time: 09/06/20  9:46 PM   Specimen: Nasal Mucosa; Nasopharyngeal  Result Value Ref Range   MRSA by PCR NEGATIVE NEGATIVE    Comment:        The GeneXpert MRSA Assay (FDA approved for NASAL specimens only), is one component of a comprehensive MRSA colonization surveillance program. It is not intended to diagnose MRSA infection nor to guide or monitor treatment for MRSA infections. Performed at [facility], [address], [place], [place] [address]   Glucose, capillary     Status: Abnormal   Collection Time: 09/06/20 10:00 PM  Result Value Ref Range   Glucose-Capillary 341 (H) 70 - 99 mg/dL    Comment: Glucose reference range applies only to samples taken after fasting for at least 8 hours.   Comment 1 Notify RN    Comment 2 Document in Chart   Glucose, capillary     Status: Abnormal   Collection Time: 09/06/20 10:58 PM  Result Value Ref Range   Glucose-Capillary 333 (H) 70 - 99 mg/dL    Comment: Glucose reference range applies only to samples taken after fasting for at least 8 hours.  Beta-hydroxybutyric acid     Status: Abnormal   Collection Time: 09/06/20 11:22 PM  Result Value Ref Range   Beta-Hydroxybutyric Acid 4.51 (H) 0.05 - 0.27 mmol/L    Comment: Performed at [facility], [address], [place], [place] [address]  Glucose, capillary     Status: Abnormal   Collection Time: 09/06/20 11:56 PM  Result Value Ref Range   Glucose-Capillary 271 (H) 70 - 99 mg/dL    Comment: Glucose reference range applies only to samples taken after fasting for at least 8 hours.   Comment 1 Notify RN    Comment 2 Document in Chart   Glucose, capillary     Status: Abnormal   Collection Time: 09/07/20  1:06 AM  Result Value Ref Range   Glucose-Capillary 211 (H) 70 - 99 mg/dL    Comment: Glucose reference range applies only to samples taken after fasting for at least 8 hours.  Glucose, capillary     Status: Abnormal   Collection Time: 09/07/20   1:53 AM  Result Value Ref Range   Glucose-Capillary 191 (H) 70 - 99 mg/dL    Comment: Glucose reference range applies only to samples taken after fasting for at least 8 hours.   Comment 1 Notify RN    Comment 2 Document in Chart   Glucose, capillary     Status: Abnormal   Collection Time: 09/07/20  2:59 AM  Result Value Ref Range   Glucose-Capillary 176 (H) 70 - 99 mg/dL    Comment: Glucose reference range applies only to samples taken after  fasting for at least 8 hours.   Comment 1 Notify RN    Comment 2 Document in Chart   Glucose, capillary     Status: Abnormal   Collection Time: 09/07/20  3:59 AM  Result Value Ref Range   Glucose-Capillary 176 (H) 70 - 99 mg/dL    Comment: Glucose reference range applies only to samples taken after fasting for at least 8 hours.   Comment 1 Notify RN    Comment 2 Document in Chart   Glucose, capillary     Status: Abnormal   Collection Time: 09/07/20  4:14 AM  Result Value Ref Range   Glucose-Capillary 197 (H) 70 - 99 mg/dL    Comment: Glucose reference range applies only to samples taken after fasting for at least 8 hours.  Glucose, capillary     Status: Abnormal   Collection Time: 09/07/20  5:01 AM  Result Value Ref Range   Glucose-Capillary 200 (H) 70 - 99 mg/dL    Comment: Glucose reference range applies only to samples taken after fasting for at least 8 hours.  Glucose, capillary     Status: Abnormal   Collection Time: 09/07/20  5:59 AM  Result Value Ref Range   Glucose-Capillary 216 (H) 70 - 99 mg/dL    Comment: Glucose reference range applies only to samples taken after fasting for at least 8 hours.   Comment 1 Notify RN    Comment 2 Document in Chart   Protime-INR     Status: Abnormal   Collection Time: 09/07/20  6:22 AM  Result Value Ref Range   Prothrombin Time 16.3 (H) 11.4 - 15.2 seconds   INR 1.4 (H) 0.8 - 1.2    Comment: (NOTE) INR goal varies based on device and disease states. Performed at [facility], [address] [place], [place] [address]   Cortisol-am, blood     Status: Abnormal   Collection Time: 09/07/20  6:22 AM  Result Value Ref Range   Cortisol - AM 24.4 (H) 6.7 - 22.6 ug/dL    Comment: Performed at [facility] [address] [place], [place] [address]  Basic metabolic panel     Status: Abnormal   Collection Time: 09/07/20  6:22 AM  Result Value Ref Range   Sodium 131 (L) 135 - 145 mmol/L    Comment: DELTA CHECK NOTED   Potassium 3.4 (L) 3.5 - 5.1 mmol/L   Chloride 99 98 - 111 mmol/L   CO2 21 (L) 22 - 32 mmol/L   Glucose, Bld 221 (H) 70 - 99 mg/dL    Comment: Glucose reference range applies only to samples taken after fasting for at least 8 hours.   BUN 52 (H) 6 - 20 mg/dL   Creatinine, Ser 1.81 (H) 0.44 - 1.00 mg/dL   Calcium 7.7 (L) 8.9 - 10.3 mg/dL   GFR, Estimated 33 (L) >60 mL/min    Comment: (NOTE) Calculated using the CKD-EPI Creatinine Equation (2021)    Anion gap 11 5 - 15    Comment: Performed at [facility], [address], [place], [place] [address]  CBC     Status: Abnormal   Collection Time: 09/07/20  6:22 AM  Result Value Ref Range   WBC 21.2 (H) 4.0 - 10.5 K/uL   RBC 3.14 (L) 3.87 - 5.11 MIL/uL   Hemoglobin 8.8 (L) 12.0 - 15.0 g/dL   HCT 27.3 (L) 36.0 - 46.0 %   MCV 86.9 80.0 - 100.0 fL  MCH 28.0 26.0 - 34.0 pg   MCHC 32.2 30.0 - 36.0 g/dL   RDW 16.9 (H) 11.5 - 15.5 %   Platelets 353 150 - 400 K/uL   nRBC 0.0 0.0 - 0.2 %    Comment: Performed at [facility], [address], [place], [place] [address]  Beta-hydroxybutyric acid     Status: Abnormal   Collection Time: 09/07/20  6:22 AM  Result Value Ref Range   Beta-Hydroxybutyric Acid 1.06 (H) 0.05 - 0.27 mmol/L    Comment: Performed at [facility], [address], [place], [place] [address]  Glucose, capillary     Status: Abnormal   Collection Time: 09/07/20  6:57 AM  Result Value Ref Range   Glucose-Capillary 191 (H) 70 - 99 mg/dL    Comment: Glucose reference range applies only to samples taken  after fasting for at least 8 hours.  Glucose, capillary     Status: Abnormal   Collection Time: 09/07/20  8:20 AM  Result Value Ref Range   Glucose-Capillary 193 (H) 70 - 99 mg/dL    Comment: Glucose reference range applies only to samples taken after fasting for at least 8 hours.  Glucose, capillary     Status: Abnormal   Collection Time: 09/07/20  9:40 AM  Result Value Ref Range   Glucose-Capillary 219 (H) 70 - 99 mg/dL    Comment: Glucose reference range applies only to samples taken after fasting for at least 8 hours.  Glucose, capillary     Status: Abnormal   Collection Time: 09/07/20 10:45 AM  Result Value Ref Range   Glucose-Capillary 188 (H) 70 - 99 mg/dL    Comment: Glucose reference range applies only to samples taken after fasting for at least 8 hours.  Basic metabolic panel     Status: Abnormal   Collection Time: 09/07/20 11:26 AM  Result Value Ref Range   Sodium 132 (L) 135 - 145 mmol/L   Potassium 3.3 (L) 3.5 - 5.1 mmol/L   Chloride 98 98 - 111 mmol/L   CO2 22 22 - 32 mmol/L   Glucose, Bld 192 (H) 70 - 99 mg/dL    Comment: Glucose reference range applies only to samples taken after fasting for at least 8 hours.   BUN 51 (H) 6 - 20 mg/dL   Creatinine, Ser 1.79 (H) 0.44 - 1.00 mg/dL   Calcium 7.8 (L) 8.9 - 10.3 mg/dL   GFR, Estimated 33 (L) >60 mL/min    Comment: (NOTE) Calculated using the CKD-EPI Creatinine Equation (2021)    Anion gap 12 5 - 15    Comment: Performed at [facility], [address] [place], [place] [address]  Glucose, capillary     Status: Abnormal   Collection Time: 09/07/20 11:26 AM  Result Value Ref Range   Glucose-Capillary 186 (H) 70 - 99 mg/dL    Comment: Glucose reference range applies only to samples taken after fasting for at least 8 hours.  Glucose, capillary     Status: Abnormal   Collection Time: 09/07/20  1:29 PM  Result Value Ref Range   Glucose-Capillary 208 (H) 70 - 99 mg/dL    Comment: Glucose reference range applies only to  samples taken after fasting for at least 8 hours.  Glucose, capillary     Status: Abnormal   Collection Time: 09/07/20  4:22 PM  Result Value Ref Range   Glucose-Capillary 228 (H) 70 - 99 mg/dL    Comment: Glucose reference range applies only to samples  taken after fasting for at least 8 hours.    CT ANGIO AO+BIFEM W & OR WO CONTRAST  Result Date: 09/07/2020 CLINICAL DATA:  Bilateral lower extremity edema and foot ulcers EXAM: CT ANGIOGRAPHY OF ABDOMINAL AORTA WITH ILIOFEMORAL RUNOFF TECHNIQUE: Multidetector CT imaging of the abdomen, pelvis and lower extremities was performed using the standard protocol during bolus administration of intravenous contrast. Multiplanar CT image reconstructions and MIPs were obtained to evaluate the vascular anatomy. CONTRAST:  127mL OMNIPAQUE IOHEXOL 350 MG/ML SOLN COMPARISON:  None. FINDINGS: VASCULAR Aorta: Normal caliber aorta without aneurysm, dissection, vasculitis or significant stenosis. Minimal trace atherosclerotic calcifications. Celiac: Patent without evidence of aneurysm, dissection, vasculitis or significant stenosis. SMA: Patent without evidence of aneurysm, dissection, vasculitis or significant stenosis. Renals: Both renal arteries are patent without evidence of aneurysm, dissection, vasculitis, fibromuscular dysplasia or significant stenosis. 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 IMA: Patent without evidence of aneurysm, dissection, vasculitis or significant stenosis. RIGHT Lower Extremity Inflow: Common, internal and external iliac arteries are patent without evidence of aneurysm, dissection, vasculitis or significant stenosis. Outflow: Common, superficial and profunda femoral arteries and the popliteal artery are patent without evidence of aneurysm, dissection, vasculitis or significant stenosis. Runoff: Patent three vessel runoff to the ankle. LEFT Lower Extremity Inflow: Common, internal and external iliac arteries are patent without evidence of  aneurysm, dissection, vasculitis or significant stenosis. Outflow: Common, superficial and profunda femoral arteries and the popliteal artery are patent without evidence of aneurysm, dissection, vasculitis or significant stenosis. Runoff: Patent three vessel runoff to the ankle. Veins: Markedly enlarged bilateral superficial great saphenous veins with multiple collateral branches in the calves. Review of the MIP images confirms the above findings. NON-VASCULAR Lower chest: No acute abnormality. Hepatobiliary: Low attenuation of the hepatic parenchyma with subtle sparing around the gallbladder fossa consistent with steatosis. Pancreas: Unremarkable. No pancreatic ductal dilatation or surrounding inflammatory changes. Spleen: Normal in size without focal abnormality. Adrenals/Urinary Tract: Adrenal glands are unremarkable. Kidneys are normal, without renal calculi, focal lesion, or hydronephrosis. Bladder is unremarkable. Stomach/Bowel: Stomach is within normal limits. Appendix appears normal. No evidence of bowel wall thickening, distention, or inflammatory changes. Lymphatic: No suspicious lymphadenopathy. Mildly enlarged bilateral superficial inguinal lymph nodes likely reactive in nature. Reproductive: Status post hysterectomy. No adnexal masses. Other: No abdominal wall hernia or abnormality. No abdominopelvic ascites. Musculoskeletal: Extensive bilateral lower extremity edema beginning just below the knee on the left, and in the mid calf on the right. A fluid collection is present within internal dystrophic calcification in the medial aspect of the left popliteal fossa consistent with a Baker's cyst. Subcutaneous emphysema present along the plantar aspect of the left foot consistent with reported ulcerations. IMPRESSION: VASCULAR 1. No evidence of hemodynamically significant arterial stenosis or occlusion. 2. Enlarged great saphenous veins bilaterally with multiple collaterals. Findings are highly suggestive of  superficial venous insufficiency. This may be a contributing factor to the patient's lower extremity swelling and ulcerations. 3. Mild atherosclerotic plaque along the abdominal aorta. Aortic Atherosclerosis (ICD10-170.0) NON-VASCULAR 1. Hepatic steatosis. 2. Left-sided Baker's cyst. 3. Left greater than right lower extremity edema. 4. Subcutaneous emphysema along the plantar surface of the left foot consistent with the clinical history of soft tissue ulcerations. Signed, Criselda Peaches, MD, [facility] Radiology Electronically Signed   By: Jacqulynn Cadet M.D.   On: 09/07/2020 16:18   MR FOOT RIGHT WO CONTRAST  Result Date: 09/06/2020 CLINICAL DATA:  Wounds on her the that are leaking EXAM:  MRI OF THE RIGHT FOREFOOT WITHOUT CONTRAST TECHNIQUE: Multiplanar, multisequence MR imaging of the right was performed. No intravenous contrast was administered. COMPARISON:  None. 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 There is also partially visualized osteoarthritis seen at the calcaneal cuboid joint with joint space loss and subchondral cystic changes. No areas of cortical destruction or periosteal reaction are seen. No large joint effusion is noted. There is a small amount of joint fluid seen between the first and second metatarsal heads. Ligaments The Lisfranc and collateral ligaments are intact. Muscles and Tendons Increased signal with diffuse fatty atrophy of the muscles are noted. The flexor and extensor tendons are intact. A Soft tissues Scattered areas of superficial ulceration seen on the plantar and medial aspect of the forefoot. There is diffuse underlying subcutaneous edema and small amount of subcutaneous emphysema. On the plantar surface beneath the first metatarsal head there is an area of ulceration with question of a small possible sinus tract, series 22, image  20. IMPRESSION: Several areas of superficial ulceration with edema seen along the medial and plantar aspect of the forefoot underneath the first and second metatarsals. There is 1 area with suggestion of a possible sinus tract/early abscess beneath the first metatarsal head. No definite evidence of osteomyelitis. Electronically Signed   By: Prudencio Pair M.D.   On: 09/06/2020 20:55   MR FOOT LEFT WO CONTRAST  Result Date: 09/06/2020 CLINICAL DATA:  Bleeding and leaking wounds on the plantar surface EXAM: MRI OF THE LEFT FOOT WITHOUT CONTRAST TECHNIQUE: Multiplanar, multisequence MR imaging of the left was performed. No intravenous contrast was administered. COMPARISON:  None. FINDINGS: The study is limited due to motion and technique and there is limited evaluation of the distal phalanges. Bones/Joint/Cartilage Within the visualized portions of the forefoot no areas of cortical destruction or periosteal reaction. There is mildly increased signal seen within the cuboid and navicular is well is the distal first phalanx without associated T1 hypointensity. Small joint effusion seen at the first MTP joint. Ligaments The Lisfranc and collateral ligaments are intact. Muscles and Tendons There is fatty atrophy with increased signal seen diffusely within the forefoot. The visualized portions of the flexor extensor tendons appear to be grossly intact. Soft tissues On the plantar surface beneath the third through fifth digits there areas of superficial ulceration and skin thickening. There is diffuse subcutaneous edema seen on the plantar surface with subcutaneous emphysema. No loculated fluid collections are seen. There is also diffuse skin thickening and dorsal subcutaneous edema. IMPRESSION: Limited evaluation of the distal phalanges. Areas of superficial ulceration seen on the plantar surface of the forefoot with diffuse subcutaneous edema and findings of cellulitis. No definite loculated fluid collections or  osteomyelitis. Electronically Signed   By: Prudencio Pair M.D.   On: 09/06/2020 20:22   DG Chest Port 1 View  Result Date: 09/06/2020 CLINICAL DATA:  Ulcerations to bilateral feet, lower extremity swelling, generalized weakness. EXAM: PORTABLE CHEST 1 VIEW COMPARISON:  Chest x-ray dated 11/15/2019 FINDINGS: Borderline cardiomegaly, stable. Lungs are clear. No pleural effusion or pneumothorax is seen. Osseous structures about the chest are unremarkable. IMPRESSION: 1. No active disease. No evidence of pneumonia or pulmonary edema. 2. Stable borderline cardiomegaly. Electronically Signed   By: Franki Cabot M.D.   On: 09/06/2020 15:20   DG Foot Complete Left  Result Date: 09/06/2020 CLINICAL DATA:  Severe ulcerations to bilateral feet. Lower extremity swelling, generalized weakness. EXAM: LEFT FOOT - COMPLETE  3+ VIEW COMPARISON:  None. FINDINGS: Deformities of the fourth and fifth proximal phalanx, most likely chronic based on configuration and superimposed callus formation. No acute appearing fracture line or displaced fracture fragment. No destructive changes elsewhere within the LEFT foot to suggest osteomyelitis. Patchy lucencies within the soft tissues along the volar aspect of the LEFT midfoot, predominantly at the level of the tarsals and metatarsals, suspicious for soft tissue gas, presumably related to overlying soft tissue ulceration. Chronic degenerative spurring within the midfoot and hindfoot, mild to moderate in degree, most prominent at the talar beak. IMPRESSION: 1. Evidence of soft tissue gas underlying the LEFT midfoot, presumably related to overlying soft tissue ulceration, necrotizing fasciitis not excluded. 2. No acute appearing osseous abnormality. No destructive changes to confirm an associated osteomyelitis. 3. Chronic appearing fractures/deformities of the fourth and fifth proximal phalanx. Electronically Signed   By: Franki Cabot M.D.   On: 09/06/2020 15:31   DG Foot Complete  Right  Result Date: 09/06/2020 CLINICAL DATA:  Weakness, severe ulcerations to bilateral feet, lower extremity swelling. EXAM: RIGHT FOOT COMPLETE - 3+ VIEW COMPARISON:  None. FINDINGS: Osseous alignment is normal. No fracture line or displaced fracture fragment. No acute appearing cortical irregularity or osseous lesion. No destructive change is seen to confirm osteomyelitis. Degenerative spurring within the midfoot and hindfoot, mild to moderate in degree. Vascular calcifications. Presumed soft tissue edema. No evidence of soft tissue gas. IMPRESSION: 1. Presumed soft tissue edema. No evidence of soft tissue gas. 2. No acute appearing osseous abnormality. No evidence of osteomyelitis. Electronically Signed   By: Franki Cabot M.D.   On: 09/06/2020 15:22     Assessment & Plan:  JANEQUA KIPNIS is a 55 y.o. 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 I obtained a CTA earlier today and this demonstrates no significant stenosis.  MRI does not show any osteo in the feet.   -Discussed debridement with the patient and risk of bleeding, worsening infection, inability to heal the wounds, issues with ambulation and needing to not walk on the wounds, and possible need for further procedures or amputation. She would not likely be a great candidate for amputation given the lymphedema and would need further evaluation by clinicians who do more work with lymphedema and wounds before going that route.    All questions were answered to the satisfaction of the patient.   Virl Cagey 09/07/2020, 7:07 PM

## 2020-09-07 NOTE — Consult Note (Addendum)
WOC Nurse Consult Note: Patient receiving care in [place].  Reason for Consult: bilateral foot cellulitis Wound type: extensive necrotic wounds on bilateral feet.  See images and imaging study reports. Topical care of this patient's feet with her underlying co-morbidities and this MRI result in particular for the right foot "There is 1 area with suggestion of a possible sinus tract/early abscess beneath the first metatarsal head. No definite evidence of osteomyelitis",  exceeds the scope of practice of the [facility] nurse.  I have reached out via Secure Chat to Dr. Reino Bellis to ask if Orthopedic specialists will be contacted for the patient. Awaiting a response. Pressure Injury POA: Yes/No/NA Measurement: Wound bed: Drainage (amount, consistency, odor)  Periwound: Dressing procedure/placement/frequency: I attempted to order Silvadene cream 1%, but got a practice alert for allergy.  Therefore, I have entered the following order: Wash bilateral foot wounds with soap and water. Pat dry. Apply betadine to all wounds, cover with dry gauze, secure with kerlex. This topical approach will not resolve the underlying medical conditions contributing to the extensive foot wounds present at this time.  Speciality services such as orthopedic, vascular, and podiatry may provide essential insights into management of this patient situation.  [facility] nurse will not follow at this time.  Please re-consult the [place] team if needed.  Val Riles, RN, MSN, CWOCN, CNS-BC, pager [phone]

## 2020-09-07 NOTE — H&P (View-Only) (Signed)
[place]   Reason for Consult: Bilateral plantar surface foot ulcers, lymphedema  Referring Physician:  Dr. Dyann Kief   Chief Complaint    Foot Pain      HPI: Michelle Skinner is a 55 y.o. female with lymphedema who says she has been using a lymphedema machine for a few weeks and that this improved her legs some. She says that about 2 weeks ago she stepped on a rock with her right foot barefooted and caused a blister. She came into the hospital with bilateral lower extremity cellulitis and blistering/ ulceration of there plantar surface of her feet. She has been walking on them and she is the only care giver of her husband who is now a bilateral amputate.  She reports having 4 children but does not think they will be helpful with her care.   Past Medical History:  Diagnosis Date  . Anemia   . Cervical cancer ([place])   . Depression   . Diabetes mellitus, type II ([place])   . GERD (gastroesophageal reflux disease)   . Hyperlipidemia   . Neuropathy   . PTSD (post-traumatic stress disorder)     Past Surgical History:  Procedure Laterality Date  . ABDOMINAL HYSTERECTOMY    . COLONOSCOPY WITH PROPOFOL N/A 05/25/2018   Procedure: COLONOSCOPY WITH PROPOFOL;  Surgeon: Daneil Dolin, MD;  [facility];  Service: Endoscopy;  Laterality: N/A;  7:30am  . OTHER SURGICAL HISTORY Right    R Foot- I&D  . POLYPECTOMY  05/25/2018   Procedure: POLYPECTOMY;  Surgeon: Daneil Dolin, MD;  Location: [facility];  Service: Endoscopy;;  colon  . UMBILICAL HERNIA REPAIR  2007    Family History  Problem Relation Age of Onset  . Hypertension Mother   . Diabetes Father   . Hyperlipidemia Father   . CAD Father   . Stroke Father   . Osteoporosis Maternal Grandmother   . Cancer Maternal Grandmother   . Hypertension Maternal Grandmother   . Colon cancer Neg Hx     Social History   Tobacco Use  . Smoking status: Never Smoker  . Smokeless tobacco: Never Used   Vaping Use  . Vaping Use: Never used  Substance Use Topics  . Alcohol use: No    Alcohol/week: 0.0 standard drinks  . Drug use: No    Medications:  I have reviewed the patient's current medications. Prior to Admission:  Medications Prior to Admission  Medication Sig Dispense Refill Last Dose  . acetaminophen (TYLENOL) 500 MG tablet Take 1,000 mg by mouth every 6 (six) hours as needed for mild pain, fever or headache.     . Ascorbic Acid (VITAMIN C PO) Take 1 tablet by mouth daily.     Marland Kitchen atorvastatin (LIPITOR) 40 MG tablet Take 1 tablet (40 mg total) by mouth at bedtime. 90 tablet 1   . Caffeine-Magnesium Salicylate (DIUREX PO) Take 200 mg by mouth daily.     . carvedilol (COREG) 25 MG tablet Take 1 tablet (25 mg total) by mouth 2 (two) times daily with a meal. 180 tablet 1   . Cholecalciferol (VITAMIN D3) 125 MCG (5000 UT) CAPS Take 1 capsule (5,000 Units total) by mouth daily. 90 capsule 0   . Continuous Blood Gluc Receiver (FREESTYLE LIBRE 14 DAY READER) DEVI by Does not apply route 4 (four) times daily.     . Continuous Blood Gluc Receiver (FREESTYLE LIBRE 2 READER) DEVI As directed 1 each 0   .  Continuous Blood Gluc Sensor (FREESTYLE LIBRE 2 SENSOR) MISC 1 Piece by Does not apply route every 14 (fourteen) days. 2 each 3   . doxycycline (ADOXA) 100 MG tablet Take 100 mg by mouth 2 (two) times daily. 10 day supply     . FLUoxetine (PROZAC) 40 MG capsule Take 40 mg by mouth daily.     . hydrALAZINE (APRESOLINE) 25 MG tablet Take 1 tablet (25 mg total) by mouth 2 (two) times daily. 60 tablet 0   . insulin regular human CONCENTRATED (HUMULIN R U-500 KWIKPEN) 500 UNIT/ML kwikpen Inject 50-60 Units into the skin 3 (three) times daily with meals. 12 mL 2   . levothyroxine (SYNTHROID) 150 MCG tablet Take 1 tablet (150 mcg total) by mouth daily before breakfast. 90 tablet 1   . Multiple Vitamin (MULTIVITAMIN WITH MINERALS) TABS tablet Take 1 tablet by mouth daily.     Marland Kitchen  neomycin-bacitracin-polymyxin (NEOSPORIN) ointment Apply 1 application topically every 12 (twelve) hours. To legs     . omeprazole (PRILOSEC) 20 MG capsule Take 1 capsule (20 mg total) by mouth 2 (two) times daily before a meal. For 10 days while on Pylera for H.pylori (Patient not taking: Reported on 04/27/2020) 20 capsule 0   . pantoprazole (PROTONIX) 40 MG tablet TAKE ONE (1) TABLET EACH DAY (Patient taking differently: Take 40 mg by mouth daily. ) 90 tablet 0   . Thiamine HCl (VITAMIN B-1 PO) Take 1 tablet by mouth daily.     Marland Kitchen VITAMIN A PO Take 1 tablet by mouth daily.     . Vitamin D, Ergocalciferol, (DRISDOL) 1.25 MG (50000 UNIT) CAPS capsule Take 1 capsule (50,000 Units total) by mouth every 7 (seven) days. (Patient not taking: Reported on 04/27/2020) 12 capsule 0   . VITAMIN E PO Take 1 tablet by mouth daily.      Scheduled: . atorvastatin  40 mg Oral QHS  . [START ON 09/08/2020] carvedilol  12.5 mg Oral BID WC  . Chlorhexidine Gluconate Cloth  6 each Topical Daily  . Chlorhexidine Gluconate Cloth  6 each Topical Once   And  . Chlorhexidine Gluconate Cloth  6 each Topical Once  . cholecalciferol  5,000 Units Oral Daily  . enoxaparin (LOVENOX) injection  70 mg Subcutaneous Q24H  . FLUoxetine  40 mg Oral Daily  . insulin aspart  0-20 Units Subcutaneous TID WC  . insulin aspart  7 Units Subcutaneous TID WC  . insulin glargine  25 Units Subcutaneous BID  . levothyroxine  150 mcg Oral QAC breakfast  . magnesium oxide  200 mg Oral Daily  . pantoprazole  40 mg Oral Daily  . senna  1 tablet Oral BID   Continuous: . sodium chloride Stopped (09/07/20 0710)  . sodium chloride 75 mL/hr at 09/07/20 1534  . cefTRIAXone (ROCEPHIN)  IV 2 g (09/07/20 1422)  . vancomycin 1,750 mg (09/07/20 1623)   JHE:RDEYCXKGYJEHU, dextrose, traMADol, traZODone  Allergies  Allergen Reactions  . Sulfa Antibiotics Rash  . Bactrim [Sulfamethoxazole-Trimethoprim] Itching  . Cherry Other (See Comments)     Unknown  . Gabapentin Other (See Comments)    Unknown  . Invokana [Canagliflozin] Other (See Comments)    Unknown  . Other     Hot peppers     ROS:  A comprehensive review of systems was negative except for: Musculoskeletal: positive for lypmhedema, cellulitis and ulcer of bilateral lower extremity  Blood pressure (!) 110/51, pulse (!) 26, temperature 97.7 F (36.5 C), temperature source  Oral, resp. rate 17, height 5' 7.5" (1.715 m), weight (!) 148.1 kg, SpO2 97 %. Physical Exam Vitals reviewed.  Constitutional:      Appearance: She is obese.  HENT:     Head: Normocephalic.     Nose: Nose normal.  Eyes:     Extraocular Movements: Extraocular movements intact.  Cardiovascular:     Rate and Rhythm: Normal rate.  Pulmonary:     Effort: Pulmonary effort is normal.  Abdominal:     General: There is no distension.     Palpations: Abdomen is soft.  Musculoskeletal:     Comments: Significant lymphedema bilateral lower extremity, no palpable pulses in the feet, ankles and feet with cellulitis, extensive ulceration/ blistering of the plantar surface of each foot with necrotic tissue, left foot with some drainage laterally   Skin:    General: Skin is warm.  Neurological:     General: No focal deficit present.     Mental Status: She is oriented to person, place, and time.  Psychiatric:        Mood and Affect: Mood normal.        Behavior: Behavior normal.             Results: Results for orders placed or performed during the hospital encounter of 09/06/20 (from the past 48 hour(s))  Resp Panel by RT-PCR (Flu A&B, Covid) Nasopharyngeal Swab     Status: None   Collection Time: 09/06/20  2:01 PM   Specimen: Nasopharyngeal Swab; Nasopharyngeal(NP) swabs in vial transport medium  Result Value Ref Range   SARS Coronavirus 2 by RT PCR NEGATIVE NEGATIVE    Comment: (NOTE) SARS-CoV-2 target nucleic acids are NOT DETECTED.  The SARS-CoV-2 RNA is generally detectable in upper  respiratory specimens during the acute phase of infection. The lowest concentration of SARS-CoV-2 viral copies this assay can detect is 138 copies/mL. A negative result does not preclude SARS-Cov-2 infection and should not be used as the sole basis for treatment or other patient management decisions. A negative result may occur with  improper specimen collection/handling, submission of specimen other than nasopharyngeal swab, presence of viral mutation(s) within the areas targeted by this assay, and inadequate number of viral copies(<138 copies/mL). A negative result must be combined with clinical observations, patient history, and epidemiological information. The expected result is Negative.  Fact Sheet for Patients:  EntrepreneurPulse.com.au  Fact Sheet for Healthcare Providers:  IncredibleEmployment.be  This test is no t yet approved or cleared by the [place] FDA and  has been authorized for detection and/or diagnosis of SARS-CoV-2 by FDA under an Emergency Use Authorization (EUA). This EUA will remain  in effect (meaning this test can be used) for the duration of the COVID-19 declaration under Section 564(b)(1) of the Act, 21 U.S.C.section 360bbb-3(b)(1), unless the authorization is terminated  or revoked sooner.       Influenza A by PCR NEGATIVE NEGATIVE   Influenza B by PCR NEGATIVE NEGATIVE    Comment: (NOTE) The Xpert Xpress SARS-CoV-2/FLU/RSV plus assay is intended as an aid in the diagnosis of influenza from Nasopharyngeal swab specimens and should not be used as a sole basis for treatment. Nasal washings and aspirates are unacceptable for Xpert Xpress SARS-CoV-2/FLU/RSV testing.  Fact Sheet for Patients: EntrepreneurPulse.com.au  Fact Sheet for Healthcare Providers: IncredibleEmployment.be  This test is not yet approved or cleared by the [place] FDA and has been authorized for  detection and/or diagnosis of SARS-CoV-2 by FDA under an Emergency Use Authorization (  EUA). This EUA will remain in effect (meaning this test can be used) for the duration of the COVID-19 declaration under Section 564(b)(1) of the Act, 21 U.S.C. section 360bbb-3(b)(1), unless the authorization is terminated or revoked.  Performed at [facility], [address] [place], [place] [address]   Lactic acid, plasma     Status: Abnormal   Collection Time: 09/06/20  2:01 PM  Result Value Ref Range   Lactic Acid, Venous 2.1 (HH) 0.5 - 1.9 mmol/L    Comment: CRITICAL RESULT CALLED TO, READ BACK BY AND VERIFIED WITH: MCCARTNEY,S ON 09/06/20 AT 1500 BY LOY,C Performed at [facility] [place], [place] [address]   Comprehensive metabolic panel     Status: Abnormal   Collection Time: 09/06/20  2:01 PM  Result Value Ref Range   Sodium 124 (L) 135 - 145 mmol/L   Potassium 3.9 3.5 - 5.1 mmol/L   Chloride 88 (L) 98 - 111 mmol/L   CO2 10 (L) 22 - 32 mmol/L   Glucose, Bld 591 (HH) 70 - 99 mg/dL    Comment: Glucose reference range applies only to samples taken after fasting for at least 8 hours. CRITICAL RESULT CALLED TO, READ BACK BY AND VERIFIED WITH: MCCARTNEY,S ON 09/06/20 AT 1510 BY LOY,C    BUN 52 (H) 6 - 20 mg/dL   Creatinine, Ser 1.94 (H) 0.44 - 1.00 mg/dL   Calcium 8.8 (L) 8.9 - 10.3 mg/dL   Total Protein 7.0 6.5 - 8.1 g/dL   Albumin 1.6 (L) 3.5 - 5.0 g/dL   AST 12 (L) 15 - 41 U/L   ALT 14 0 - 44 U/L   Alkaline Phosphatase 120 38 - 126 U/L   Total Bilirubin 2.4 (H) 0.3 - 1.2 mg/dL   GFR, Estimated 30 (L) >60 mL/min    Comment: (NOTE) Calculated using the CKD-EPI Creatinine Equation (2021)    Anion gap 26 (H) 5 - 15    Comment: REPEATED TO VERIFY Performed at [facility], [address], [place], [place] [address]   CBC WITH DIFFERENTIAL     Status: Abnormal   Collection Time: 09/06/20  2:01 PM  Result Value Ref Range   WBC 25.4 (H) 4.0 - 10.5 K/uL   RBC 3.47 (L) 3.87  - 5.11 MIL/uL   Hemoglobin 9.8 (L) 12.0 - 15.0 g/dL   HCT 31.6 (L) 36.0 - 46.0 %   MCV 91.1 80.0 - 100.0 fL   MCH 28.2 26.0 - 34.0 pg   MCHC 31.0 30.0 - 36.0 g/dL   RDW 17.2 (H) 11.5 - 15.5 %   Platelets 402 (H) 150 - 400 K/uL   nRBC 0.0 0.0 - 0.2 %   Neutrophils Relative % 88 %   Neutro Abs 22.1 (H) 1.7 - 7.7 K/uL   Lymphocytes Relative 3 %   Lymphs Abs 0.9 0.7 - 4.0 K/uL   Monocytes Relative 3 %   Monocytes Absolute 0.8 0.1 - 1.0 K/uL   Eosinophils Relative 0 %   Eosinophils Absolute 0.0 0.0 - 0.5 K/uL   Basophils Relative 0 %   Basophils Absolute 0.0 0.0 - 0.1 K/uL   WBC Morphology MILD LEFT SHIFT (1-5% METAS, OCC MYELO, OCC BANDS)     Comment: TOXIC GRANULATION   Immature Granulocytes 6 %   Abs Immature Granulocytes 1.62 (H) 0.00 - 0.07 K/uL    Comment: Performed at [facility], [address] [place], [place] [address]  Protime-INR     Status: Abnormal  Collection Time: 09/06/20  2:01 PM  Result Value Ref Range   Prothrombin Time 18.5 (H) 11.4 - 15.2 seconds   INR 1.6 (H) 0.8 - 1.2    Comment: (NOTE) INR goal varies based on device and disease states. Performed at [facility], [address] [place], [place] [address]   APTT     Status: None   Collection Time: 09/06/20  2:01 PM  Result Value Ref Range   aPTT 30 24 - 36 seconds    Comment: Performed at [facility], [address] [place], [place] [address]  Urinalysis, Routine w reflex microscopic     Status: Abnormal   Collection Time: 09/06/20  2:01 PM  Result Value Ref Range   Color, Urine YELLOW YELLOW   APPearance CLEAR CLEAR   Specific Gravity, Urine 1.017 1.005 - 1.030   pH 5.0 5.0 - 8.0   Glucose, UA >=500 (A) NEGATIVE mg/dL   Hgb urine dipstick SMALL (A) NEGATIVE   Bilirubin Urine NEGATIVE NEGATIVE   Ketones, ur 20 (A) NEGATIVE mg/dL   Protein, ur 100 (A) NEGATIVE mg/dL   Nitrite NEGATIVE NEGATIVE   Leukocytes,Ua NEGATIVE NEGATIVE   RBC / HPF 0-5 0 - 5 RBC/hpf   WBC, UA 11-20 0 - 5 WBC/hpf    Bacteria, UA RARE (A) NONE SEEN   Squamous Epithelial / LPF 0-5 0 - 5   Mucus PRESENT    Hyaline Casts, UA PRESENT     Comment: Performed at [facility], [address] [place], [place] [address]  Blood Culture (routine x 2)     Status: None (Preliminary result)   Collection Time: 09/06/20  2:14 PM   Specimen: BLOOD  Result Value Ref Range   Specimen Description      BLOOD LEFT ANTECUBITAL Performed at [facility], [address] [place], [place] [address]    Special Requests      BOTTLES DRAWN AEROBIC AND ANAEROBIC Blood Culture adequate volume Performed at [facility], [address], [place], [place] [address]    Culture  Setup Time      GRAM POSITIVE COCCI IN BOTH AEROBIC AND ANAEROBIC BOTTLES Gram Stain Report Called to,Read Back By and Verified With: LARIMORE,[email or web address]  BY MATTHEWS, B 3.3.2022 Performed at [facility] ID to follow CRITICAL RESULT CALLED TO, READ BACK BY AND VERIFIED WITHMartin Majestic RN [id number] 09/07/20 A BROWNING Performed at [facility], [place] [address] [place], [place] [address]    Culture PENDING    Report Status PENDING   Blood Culture ID Panel (Reflexed)     Status: Abnormal   Collection Time: 09/06/20  2:14 PM  Result Value Ref Range   Enterococcus faecalis NOT DETECTED NOT DETECTED   Enterococcus Faecium NOT DETECTED NOT DETECTED   Listeria monocytogenes NOT DETECTED NOT DETECTED   Staphylococcus species DETECTED (A) NOT DETECTED    Comment: CRITICAL RESULT CALLED TO, READ BACK BY AND VERIFIED WITH: Martin Majestic RN [id number] 09/07/20 A BROWNING    Staphylococcus aureus (BCID) DETECTED (A) NOT DETECTED    Comment: CRITICAL RESULT CALLED TO, READ BACK BY AND VERIFIED WITHMartin Majestic RN [id number] 09/07/20 A BROWNING    Staphylococcus epidermidis NOT DETECTED NOT DETECTED   Staphylococcus lugdunensis NOT DETECTED NOT DETECTED   Streptococcus species NOT DETECTED NOT DETECTED   Streptococcus agalactiae NOT DETECTED NOT DETECTED   Streptococcus  pneumoniae NOT DETECTED NOT DETECTED   Streptococcus pyogenes NOT DETECTED NOT DETECTED   A.calcoaceticus-baumannii NOT DETECTED NOT  DETECTED   Bacteroides fragilis NOT DETECTED NOT DETECTED   Enterobacterales NOT DETECTED NOT DETECTED   Enterobacter cloacae complex NOT DETECTED NOT DETECTED   Escherichia coli NOT DETECTED NOT DETECTED   Klebsiella aerogenes NOT DETECTED NOT DETECTED   Klebsiella oxytoca NOT DETECTED NOT DETECTED   Klebsiella pneumoniae NOT DETECTED NOT DETECTED   Proteus species NOT DETECTED NOT DETECTED   Salmonella species NOT DETECTED NOT DETECTED   Serratia marcescens NOT DETECTED NOT DETECTED   Haemophilus influenzae NOT DETECTED NOT DETECTED   Neisseria meningitidis NOT DETECTED NOT DETECTED   Pseudomonas aeruginosa NOT DETECTED NOT DETECTED   Stenotrophomonas maltophilia NOT DETECTED NOT DETECTED   Candida albicans NOT DETECTED NOT DETECTED   Candida auris NOT DETECTED NOT DETECTED   Candida glabrata NOT DETECTED NOT DETECTED   Candida krusei NOT DETECTED NOT DETECTED   Candida parapsilosis NOT DETECTED NOT DETECTED   Candida tropicalis NOT DETECTED NOT DETECTED   Cryptococcus neoformans/gattii NOT DETECTED NOT DETECTED   Meth resistant mecA/C and MREJ NOT DETECTED NOT DETECTED    Comment: Performed at [facility] [address], [place], [place] [address]  Lactic acid, plasma     Status: Abnormal   Collection Time: 09/06/20  3:59 PM  Result Value Ref Range   Lactic Acid, Venous 2.2 (HH) 0.5 - 1.9 mmol/L    Comment: CRITICAL VALUE NOTED.  VALUE IS CONSISTENT WITH PREVIOUSLY REPORTED AND CALLED VALUE. Performed at [facility], [address], [place], [place] [address]   Blood Culture (routine x 2)     Status: None (Preliminary result)   Collection Time: 09/06/20  3:59 PM   Specimen: BLOOD RIGHT HAND  Result Value Ref Range   Specimen Description BLOOD RIGHT HAND    Special Requests      BOTTLES DRAWN AEROBIC AND ANAEROBIC Blood Culture adequate  volume   Culture      NO GROWTH < 24 HOURS Performed at [facility], [address], [place], [place] [address]    Report Status PENDING   Beta-hydroxybutyric acid     Status: Abnormal   Collection Time: 09/06/20  3:59 PM  Result Value Ref Range   Beta-Hydroxybutyric Acid >8.00 (H) 0.05 - 0.27 mmol/L    Comment: RESULTS CONFIRMED BY MANUAL DILUTION Performed at [facility], [address] [place], [place] [address]   Blood gas, venous     Status: Abnormal   Collection Time: 09/06/20  3:59 PM  Result Value Ref Range   FIO2 21.00    pH, Ven 7.229 (L) 7.250 - 7.430   pCO2, Ven 26.6 (L) 44.0 - 60.0 mmHg   pO2, Ven 36.2 32.0 - 45.0 mmHg   Bicarbonate 12.2 (L) 20.0 - 28.0 mmol/L   Acid-base deficit 15.4 (H) 0.0 - 2.0 mmol/L   O2 Saturation 57.0 %   Patient temperature 36.4     Comment: Performed at [facility], [address], [place], [place] [address]  HIV Antibody (routine testing w rflx)     Status: None   Collection Time: 09/06/20  3:59 PM  Result Value Ref Range   HIV Screen 4th Generation wRfx Non Reactive Non Reactive    Comment: Performed at [facility], [place] [address] [place], [place] [address]  CBG monitoring, ED     Status: Abnormal   Collection Time: 09/06/20  4:25 PM  Result Value Ref Range   Glucose-Capillary 448 (H) 70 - 99 mg/dL    Comment: Glucose reference range applies  only to samples taken after fasting for at least 8 hours.   Comment 1 Notify RN   CBG monitoring, ED     Status: Abnormal   Collection Time: 09/06/20  5:50 PM  Result Value Ref Range   Glucose-Capillary 308 (H) 70 - 99 mg/dL    Comment: Glucose reference range applies only to samples taken after fasting for at least 8 hours.  CBG monitoring, ED     Status: Abnormal   Collection Time: 09/06/20  8:17 PM  Result Value Ref Range   Glucose-Capillary 329 (H) 70 - 99 mg/dL    Comment: Glucose reference range applies only to samples taken after fasting for at least 8 hours.  MRSA PCR  Screening     Status: None   Collection Time: 09/06/20  9:46 PM   Specimen: Nasal Mucosa; Nasopharyngeal  Result Value Ref Range   MRSA by PCR NEGATIVE NEGATIVE    Comment:        The GeneXpert MRSA Assay (FDA approved for NASAL specimens only), is one component of a comprehensive MRSA colonization surveillance program. It is not intended to diagnose MRSA infection nor to guide or monitor treatment for MRSA infections. Performed at [facility], [address], [place], [place] [address]   Glucose, capillary     Status: Abnormal   Collection Time: 09/06/20 10:00 PM  Result Value Ref Range   Glucose-Capillary 341 (H) 70 - 99 mg/dL    Comment: Glucose reference range applies only to samples taken after fasting for at least 8 hours.   Comment 1 Notify RN    Comment 2 Document in Chart   Glucose, capillary     Status: Abnormal   Collection Time: 09/06/20 10:58 PM  Result Value Ref Range   Glucose-Capillary 333 (H) 70 - 99 mg/dL    Comment: Glucose reference range applies only to samples taken after fasting for at least 8 hours.  Beta-hydroxybutyric acid     Status: Abnormal   Collection Time: 09/06/20 11:22 PM  Result Value Ref Range   Beta-Hydroxybutyric Acid 4.51 (H) 0.05 - 0.27 mmol/L    Comment: Performed at [facility], [address], [place], [place] [address]  Glucose, capillary     Status: Abnormal   Collection Time: 09/06/20 11:56 PM  Result Value Ref Range   Glucose-Capillary 271 (H) 70 - 99 mg/dL    Comment: Glucose reference range applies only to samples taken after fasting for at least 8 hours.   Comment 1 Notify RN    Comment 2 Document in Chart   Glucose, capillary     Status: Abnormal   Collection Time: 09/07/20  1:06 AM  Result Value Ref Range   Glucose-Capillary 211 (H) 70 - 99 mg/dL    Comment: Glucose reference range applies only to samples taken after fasting for at least 8 hours.  Glucose, capillary     Status: Abnormal   Collection Time: 09/07/20   1:53 AM  Result Value Ref Range   Glucose-Capillary 191 (H) 70 - 99 mg/dL    Comment: Glucose reference range applies only to samples taken after fasting for at least 8 hours.   Comment 1 Notify RN    Comment 2 Document in Chart   Glucose, capillary     Status: Abnormal   Collection Time: 09/07/20  2:59 AM  Result Value Ref Range   Glucose-Capillary 176 (H) 70 - 99 mg/dL    Comment: Glucose reference range applies only to samples taken after  fasting for at least 8 hours.   Comment 1 Notify RN    Comment 2 Document in Chart   Glucose, capillary     Status: Abnormal   Collection Time: 09/07/20  3:59 AM  Result Value Ref Range   Glucose-Capillary 176 (H) 70 - 99 mg/dL    Comment: Glucose reference range applies only to samples taken after fasting for at least 8 hours.   Comment 1 Notify RN    Comment 2 Document in Chart   Glucose, capillary     Status: Abnormal   Collection Time: 09/07/20  4:14 AM  Result Value Ref Range   Glucose-Capillary 197 (H) 70 - 99 mg/dL    Comment: Glucose reference range applies only to samples taken after fasting for at least 8 hours.  Glucose, capillary     Status: Abnormal   Collection Time: 09/07/20  5:01 AM  Result Value Ref Range   Glucose-Capillary 200 (H) 70 - 99 mg/dL    Comment: Glucose reference range applies only to samples taken after fasting for at least 8 hours.  Glucose, capillary     Status: Abnormal   Collection Time: 09/07/20  5:59 AM  Result Value Ref Range   Glucose-Capillary 216 (H) 70 - 99 mg/dL    Comment: Glucose reference range applies only to samples taken after fasting for at least 8 hours.   Comment 1 Notify RN    Comment 2 Document in Chart   Protime-INR     Status: Abnormal   Collection Time: 09/07/20  6:22 AM  Result Value Ref Range   Prothrombin Time 16.3 (H) 11.4 - 15.2 seconds   INR 1.4 (H) 0.8 - 1.2    Comment: (NOTE) INR goal varies based on device and disease states. Performed at [facility], [address], [place], [place] [address]   Cortisol-am, blood     Status: Abnormal   Collection Time: 09/07/20  6:22 AM  Result Value Ref Range   Cortisol - AM 24.4 (H) 6.7 - 22.6 ug/dL    Comment: Performed at [facility] [address], [place], [place] [address]  Basic metabolic panel     Status: Abnormal   Collection Time: 09/07/20  6:22 AM  Result Value Ref Range   Sodium 131 (L) 135 - 145 mmol/L    Comment: DELTA CHECK NOTED   Potassium 3.4 (L) 3.5 - 5.1 mmol/L   Chloride 99 98 - 111 mmol/L   CO2 21 (L) 22 - 32 mmol/L   Glucose, Bld 221 (H) 70 - 99 mg/dL    Comment: Glucose reference range applies only to samples taken after fasting for at least 8 hours.   BUN 52 (H) 6 - 20 mg/dL   Creatinine, Ser 1.81 (H) 0.44 - 1.00 mg/dL   Calcium 7.7 (L) 8.9 - 10.3 mg/dL   GFR, Estimated 33 (L) >60 mL/min    Comment: (NOTE) Calculated using the CKD-EPI Creatinine Equation (2021)    Anion gap 11 5 - 15    Comment: Performed at [facility], [address] [place], [place] [address]  CBC     Status: Abnormal   Collection Time: 09/07/20  6:22 AM  Result Value Ref Range   WBC 21.2 (H) 4.0 - 10.5 K/uL   RBC 3.14 (L) 3.87 - 5.11 MIL/uL   Hemoglobin 8.8 (L) 12.0 - 15.0 g/dL   HCT 27.3 (L) 36.0 - 46.0 %   MCV 86.9 80.0 - 100.0 fL  MCH 28.0 26.0 - 34.0 pg   MCHC 32.2 30.0 - 36.0 g/dL   RDW 16.9 (H) 11.5 - 15.5 %   Platelets 353 150 - 400 K/uL   nRBC 0.0 0.0 - 0.2 %    Comment: Performed at [facility], [address] [place], [place] [address]  Beta-hydroxybutyric acid     Status: Abnormal   Collection Time: 09/07/20  6:22 AM  Result Value Ref Range   Beta-Hydroxybutyric Acid 1.06 (H) 0.05 - 0.27 mmol/L    Comment: Performed at [facility] [address], [place], [place] [address]  Glucose, capillary     Status: Abnormal   Collection Time: 09/07/20  6:57 AM  Result Value Ref Range   Glucose-Capillary 191 (H) 70 - 99 mg/dL    Comment: Glucose reference range applies only to samples taken  after fasting for at least 8 hours.  Glucose, capillary     Status: Abnormal   Collection Time: 09/07/20  8:20 AM  Result Value Ref Range   Glucose-Capillary 193 (H) 70 - 99 mg/dL    Comment: Glucose reference range applies only to samples taken after fasting for at least 8 hours.  Glucose, capillary     Status: Abnormal   Collection Time: 09/07/20  9:40 AM  Result Value Ref Range   Glucose-Capillary 219 (H) 70 - 99 mg/dL    Comment: Glucose reference range applies only to samples taken after fasting for at least 8 hours.  Glucose, capillary     Status: Abnormal   Collection Time: 09/07/20 10:45 AM  Result Value Ref Range   Glucose-Capillary 188 (H) 70 - 99 mg/dL    Comment: Glucose reference range applies only to samples taken after fasting for at least 8 hours.  Basic metabolic panel     Status: Abnormal   Collection Time: 09/07/20 11:26 AM  Result Value Ref Range   Sodium 132 (L) 135 - 145 mmol/L   Potassium 3.3 (L) 3.5 - 5.1 mmol/L   Chloride 98 98 - 111 mmol/L   CO2 22 22 - 32 mmol/L   Glucose, Bld 192 (H) 70 - 99 mg/dL    Comment: Glucose reference range applies only to samples taken after fasting for at least 8 hours.   BUN 51 (H) 6 - 20 mg/dL   Creatinine, Ser 1.79 (H) 0.44 - 1.00 mg/dL   Calcium 7.8 (L) 8.9 - 10.3 mg/dL   GFR, Estimated 33 (L) >60 mL/min    Comment: (NOTE) Calculated using the CKD-EPI Creatinine Equation (2021)    Anion gap 12 5 - 15    Comment: Performed at [facility], [address], [place],  [address]  Glucose, capillary     Status: Abnormal   Collection Time: 09/07/20 11:26 AM  Result Value Ref Range   Glucose-Capillary 186 (H) 70 - 99 mg/dL    Comment: Glucose reference range applies only to samples taken after fasting for at least 8 hours.  Glucose, capillary     Status: Abnormal   Collection Time: 09/07/20  1:29 PM  Result Value Ref Range   Glucose-Capillary 208 (H) 70 - 99 mg/dL    Comment: Glucose reference range applies only to  samples taken after fasting for at least 8 hours.  Glucose, capillary     Status: Abnormal   Collection Time: 09/07/20  4:22 PM  Result Value Ref Range   Glucose-Capillary 228 (H) 70 - 99 mg/dL    Comment: Glucose reference range applies only to samples  taken after fasting for at least 8 hours.    CT ANGIO AO+BIFEM W & OR WO CONTRAST  Result Date: 09/07/2020 CLINICAL DATA:  Bilateral lower extremity edema and foot ulcers EXAM: CT ANGIOGRAPHY OF ABDOMINAL AORTA WITH ILIOFEMORAL RUNOFF TECHNIQUE: Multidetector CT imaging of the abdomen, pelvis and lower extremities was performed using the standard protocol during bolus administration of intravenous contrast. Multiplanar CT image reconstructions and MIPs were obtained to evaluate the vascular anatomy. CONTRAST:  148mL OMNIPAQUE IOHEXOL 350 MG/ML SOLN COMPARISON:  None. FINDINGS: VASCULAR Aorta: Normal caliber aorta without aneurysm, dissection, vasculitis or significant stenosis. Minimal trace atherosclerotic calcifications. Celiac: Patent without evidence of aneurysm, dissection, vasculitis or significant stenosis. SMA: Patent without evidence of aneurysm, dissection, vasculitis or significant stenosis. Renals: Both renal arteries are patent without evidence of aneurysm, dissection, vasculitis, fibromuscular dysplasia or significant stenosis. 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 IMA: Patent without evidence of aneurysm, dissection, vasculitis or significant stenosis. RIGHT Lower Extremity Inflow: Common, internal and external iliac arteries are patent without evidence of aneurysm, dissection, vasculitis or significant stenosis. Outflow: Common, superficial and profunda femoral arteries and the popliteal artery are patent without evidence of aneurysm, dissection, vasculitis or significant stenosis. Runoff: Patent three vessel runoff to the ankle. LEFT Lower Extremity Inflow: Common, internal and external iliac arteries are patent without evidence of  aneurysm, dissection, vasculitis or significant stenosis. Outflow: Common, superficial and profunda femoral arteries and the popliteal artery are patent without evidence of aneurysm, dissection, vasculitis or significant stenosis. Runoff: Patent three vessel runoff to the ankle. Veins: Markedly enlarged bilateral superficial great saphenous veins with multiple collateral branches in the calves. Review of the MIP images confirms the above findings. NON-VASCULAR Lower chest: No acute abnormality. Hepatobiliary: Low attenuation of the hepatic parenchyma with subtle sparing around the gallbladder fossa consistent with steatosis. Pancreas: Unremarkable. No pancreatic ductal dilatation or surrounding inflammatory changes. Spleen: Normal in size without focal abnormality. Adrenals/Urinary Tract: Adrenal glands are unremarkable. Kidneys are normal, without renal calculi, focal lesion, or hydronephrosis. Bladder is unremarkable. Stomach/Bowel: Stomach is within normal limits. Appendix appears normal. No evidence of bowel wall thickening, distention, or inflammatory changes. Lymphatic: No suspicious lymphadenopathy. Mildly enlarged bilateral superficial inguinal lymph nodes likely reactive in nature. Reproductive: Status post hysterectomy. No adnexal masses. Other: No abdominal wall hernia or abnormality. No abdominopelvic ascites. Musculoskeletal: Extensive bilateral lower extremity edema beginning just below the knee on the left, and in the mid calf on the right. A fluid collection is present within internal dystrophic calcification in the medial aspect of the left popliteal fossa consistent with a Baker's cyst. Subcutaneous emphysema present along the plantar aspect of the left foot consistent with reported ulcerations. IMPRESSION: VASCULAR 1. No evidence of hemodynamically significant arterial stenosis or occlusion. 2. Enlarged great saphenous veins bilaterally with multiple collaterals. Findings are highly suggestive of  superficial venous insufficiency. This may be a contributing factor to the patient's lower extremity swelling and ulcerations. 3. Mild atherosclerotic plaque along the abdominal aorta. Aortic Atherosclerosis (ICD10-170.0) NON-VASCULAR 1. Hepatic steatosis. 2. Left-sided Baker's cyst. 3. Left greater than right lower extremity edema. 4. Subcutaneous emphysema along the plantar surface of the left foot consistent with the clinical history of soft tissue ulcerations. Signed, Criselda Peaches, MD, [facility] Electronically Signed   By: Jacqulynn Cadet M.D.   On: 09/07/2020 16:18   MR FOOT RIGHT WO CONTRAST  Result Date: 09/06/2020 CLINICAL DATA:  Wounds on her the that are leaking EXAM:  MRI OF THE RIGHT FOREFOOT WITHOUT CONTRAST TECHNIQUE: Multiplanar, multisequence MR imaging of the right was performed. No intravenous contrast was administered. COMPARISON:  None. 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 There is also partially visualized osteoarthritis seen at the calcaneal cuboid joint with joint space loss and subchondral cystic changes. No areas of cortical destruction or periosteal reaction are seen. No large joint effusion is noted. There is a small amount of joint fluid seen between the first and second metatarsal heads. Ligaments The Lisfranc and collateral ligaments are intact. Muscles and Tendons Increased signal with diffuse fatty atrophy of the muscles are noted. The flexor and extensor tendons are intact. A Soft tissues Scattered areas of superficial ulceration seen on the plantar and medial aspect of the forefoot. There is diffuse underlying subcutaneous edema and small amount of subcutaneous emphysema. On the plantar surface beneath the first metatarsal head there is an area of ulceration with question of a small possible sinus tract, series 22, image  20. IMPRESSION: Several areas of superficial ulceration with edema seen along the medial and plantar aspect of the forefoot underneath the first and second metatarsals. There is 1 area with suggestion of a possible sinus tract/early abscess beneath the first metatarsal head. No definite evidence of osteomyelitis. Electronically Signed   By: Prudencio Pair M.D.   On: 09/06/2020 20:55   MR FOOT LEFT WO CONTRAST  Result Date: 09/06/2020 CLINICAL DATA:  Bleeding and leaking wounds on the plantar surface EXAM: MRI OF THE LEFT FOOT WITHOUT CONTRAST TECHNIQUE: Multiplanar, multisequence MR imaging of the left was performed. No intravenous contrast was administered. COMPARISON:  None. FINDINGS: The study is limited due to motion and technique and there is limited evaluation of the distal phalanges. Bones/Joint/Cartilage Within the visualized portions of the forefoot no areas of cortical destruction or periosteal reaction. There is mildly increased signal seen within the cuboid and navicular is well is the distal first phalanx without associated T1 hypointensity. Small joint effusion seen at the first MTP joint. Ligaments The Lisfranc and collateral ligaments are intact. Muscles and Tendons There is fatty atrophy with increased signal seen diffusely within the forefoot. The visualized portions of the flexor extensor tendons appear to be grossly intact. Soft tissues On the plantar surface beneath the third through fifth digits there areas of superficial ulceration and skin thickening. There is diffuse subcutaneous edema seen on the plantar surface with subcutaneous emphysema. No loculated fluid collections are seen. There is also diffuse skin thickening and dorsal subcutaneous edema. IMPRESSION: Limited evaluation of the distal phalanges. Areas of superficial ulceration seen on the plantar surface of the forefoot with diffuse subcutaneous edema and findings of cellulitis. No definite loculated fluid collections or  osteomyelitis. Electronically Signed   By: Prudencio Pair M.D.   On: 09/06/2020 20:22   DG Chest Port 1 View  Result Date: 09/06/2020 CLINICAL DATA:  Ulcerations to bilateral feet, lower extremity swelling, generalized weakness. EXAM: PORTABLE CHEST 1 VIEW COMPARISON:  Chest x-ray dated 11/15/2019 FINDINGS: Borderline cardiomegaly, stable. Lungs are clear. No pleural effusion or pneumothorax is seen. Osseous structures about the chest are unremarkable. IMPRESSION: 1. No active disease. No evidence of pneumonia or pulmonary edema. 2. Stable borderline cardiomegaly. Electronically Signed   By: Franki Cabot M.D.   On: 09/06/2020 15:20   DG Foot Complete Left  Result Date: 09/06/2020 CLINICAL DATA:  Severe ulcerations to bilateral feet. Lower extremity swelling, generalized weakness. EXAM: LEFT FOOT - COMPLETE  3+ VIEW COMPARISON:  None. FINDINGS: Deformities of the fourth and fifth proximal phalanx, most likely chronic based on configuration and superimposed callus formation. No acute appearing fracture line or displaced fracture fragment. No destructive changes elsewhere within the LEFT foot to suggest osteomyelitis. Patchy lucencies within the soft tissues along the volar aspect of the LEFT midfoot, predominantly at the level of the tarsals and metatarsals, suspicious for soft tissue gas, presumably related to overlying soft tissue ulceration. Chronic degenerative spurring within the midfoot and hindfoot, mild to moderate in degree, most prominent at the talar beak. IMPRESSION: 1. Evidence of soft tissue gas underlying the LEFT midfoot, presumably related to overlying soft tissue ulceration, necrotizing fasciitis not excluded. 2. No acute appearing osseous abnormality. No destructive changes to confirm an associated osteomyelitis. 3. Chronic appearing fractures/deformities of the fourth and fifth proximal phalanx. Electronically Signed   By: Franki Cabot M.D.   On: 09/06/2020 15:31   DG Foot Complete  Right  Result Date: 09/06/2020 CLINICAL DATA:  Weakness, severe ulcerations to bilateral feet, lower extremity swelling. EXAM: RIGHT FOOT COMPLETE - 3+ VIEW COMPARISON:  None. FINDINGS: Osseous alignment is normal. No fracture line or displaced fracture fragment. No acute appearing cortical irregularity or osseous lesion. No destructive change is seen to confirm osteomyelitis. Degenerative spurring within the midfoot and hindfoot, mild to moderate in degree. Vascular calcifications. Presumed soft tissue edema. No evidence of soft tissue gas. IMPRESSION: 1. Presumed soft tissue edema. No evidence of soft tissue gas. 2. No acute appearing osseous abnormality. No evidence of osteomyelitis. Electronically Signed   By: Franki Cabot M.D.   On: 09/06/2020 15:22     Assessment & Plan:  TYIANNA MENEFEE is a 55 y.o. 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 I obtained a CTA earlier today and this demonstrates no significant stenosis.  MRI does not show any osteo in the feet.   -Discussed debridement with the patient and risk of bleeding, worsening infection, inability to heal the wounds, issues with ambulation and needing to not walk on the wounds, and possible need for further procedures or amputation. She would not likely be a great candidate for amputation given the lymphedema and would need further evaluation by clinicians who do more work with lymphedema and wounds before going that route.    All questions were answered to the satisfaction of the patient.   Michelle Skinner 09/07/2020, 7:07 PM

## 2020-09-07 NOTE — Progress Notes (Signed)
PROGRESS NOTE    Michelle Skinner  VOZ:[id number] DOB: 09-Jul-1965 DOA: 09/06/2020 PCP: Monico Blitz, MD   Chief Complaint  Patient presents with  . Foot Pain    Brief admission narrative:  As per H&P written by Dr. Linda Hedges on 09/06/2020 Michelle Skinner is a 55 y.o. female with medical history significant of morbid obesity, DM2 on insulin w/ peripheral neuropathy, HTN, GERD. She reports that she incurred "bone bruises" to both feet several weeks ago which have progressed to open wounds. She had progressive weakness and marked pain in both feet. She presents to [facility] for evaluation.  ED Course: T 97.5  133/54  HR 108  RR 30  BMI 54.Cmet with Na 124, K 3.9, BUN 52, Cr 1.94 (baseline 1.3), lactic acid 2.1, WBC 25.4 w/ 88/3/3, INR 1.6. Code sepsis initiated 2/2 low BP, tachycardia, AKI, lactic acidosis with source of infection severe bilateral cellulitis involving both feet and possible osteomyelitis: patient received 2L LR, Cefriaxone given  and Vanomycin ordered and given. Patient also with hyperglycemia with serum glucose 591. Glycemic protocol initiated with insulin infusion, K replacement and standard titration orders.   Assessment & Plan: 1-Uncontrolled type 2 diabetes mellitus with complication, with long-term current use of insulin ([place]): Presented with DKA -IV fluids, close CBGs/treatment monitoring and insulin drip provided -Follow results of transition of insulin drip when appropriate. -Sips of water and ice chips while n.p.o. is accepted. -Follow electrolytes and replete as needed.  2-sepsis secondary to bilateral feet cellulitis -Continue current antibiotic therapy -Abdominal clinical response and cultures results -General surgery has been consulted for potential need of surgical debridement: Intervention.  3-Primary hypothyroidism -Continue Synthroid  4-Mixed hyperlipidemia -Continue statins  5-Essential hypertension, benign -Soft blood pressure: -Will hold  hydralazine -Carvedilol dose cut in half with intention to resume on 09/08/2020 -Close monitoring of patient vital signs.  6-acute on chronic renal failure; stage IIIa at baseline -Continue to minimize nephrotoxic agents, hypotension and the use of contrast -Follow-up renal function trend -Continue IV fluids until adequate hydration  7-moderate obesity -Body mass index is 50.38 kg/m. -Low calorie diet and portion control discussed with patient.  8-depression -No suicidal ideation hallucination -Continue Prozac and trazodone.  9-gastroesophageal flux disease -Continue PPI.   DVT prophylaxis: Lovenox Code Status: Full code Family Communication: No family at bedside. Disposition:   Status is: Inpatient  Dispo: The patient is from: Home              Anticipated d/c is to: To be determined              Patient currently not medically stable for discharge; receiving treatment for DKA, sepsis and ongoing bilateral cellulitic process and feet wounds requiring IV antibiotics and most likely surgical debridement..   Difficult to place patient no       Consultants:   General surgery    Procedures:  See below for x-ray reports   Antimicrobials:  Rocephin and vancomycin   Subjective: Afebrile, complaining of bilateral feet discomfort; no chest pain, no nausea, no vomiting.  Expressed feeling hungry.  During my evaluation is still receiving insulin drip and treatment for DKA.  Objective: Vitals:   09/07/20 1300 09/07/20 1400 09/07/20 1500 09/07/20 1600  BP: (!) 120/53 (!) 117/59 (!) 122/57 (!) 100/38  Pulse: (!) 57 (!) 58 (!) 59 60  Resp: 16 13 10 20   Temp:      TempSrc:      SpO2: 96% 98% 98% 93%  Weight:  Height:        Intake/Output Summary (Last 24 hours) at 09/07/2020 1612 Last data filed at 09/07/2020 0831 Gross per 24 hour  Intake 1098.62 ml  Output 1150 ml  Net -51.38 ml   Filed Weights   09/06/20 1249 09/06/20 2200  Weight: (!) 158.8 kg (!) 148.1  kg    Examination:  General exam: Afebrile, normal WBC; denying chest pain, nausea, vomiting or focal deficits.  Patient expressed bilateral pain in her feet. Respiratory system: Clear to auscultation. Respiratory effort normal.  No using accessory muscle. Cardiovascular system: S1 & S2 heard, RRR. No JVD, murmurs, rubs, gallops or clicks. No pedal edema. Gastrointestinal system: Abdomen is obese, nondistended, soft and nontender. No organomegaly or masses felt. Normal bowel sounds heard. Central nervous system: Alert and oriented. No focal neurological deficits. Extremities/skin: With chronic lymphedema changes, chronic stasis dermatitis and ascending erythematous changes bilaterally.  Please refer to media section in epic for images of bilateral feet to demonstrate ongoing wounds. Psychiatry: Judgement and insight appear normal. Mood & affect appropriate.     Data Reviewed: I have personally reviewed following labs and imaging studies  CBC: Recent Labs  Lab 09/06/20 1401 09/07/20 0622  WBC 25.4* 21.2*  NEUTROABS 22.1*  --   HGB 9.8* 8.8*  HCT 31.6* 27.3*  MCV 91.1 86.9  PLT 402* 762    Basic Metabolic Panel: Recent Labs  Lab 09/06/20 1401 09/07/20 0622 09/07/20 1126  NA 124* 131* 132*  K 3.9 3.4* 3.3*  CL 88* 99 98  CO2 10* 21* 22  GLUCOSE 591* 221* 192*  BUN 52* 52* 51*  CREATININE 1.94* 1.81* 1.79*  CALCIUM 8.8* 7.7* 7.8*    GFR: Estimated Creatinine Clearance: 55.6 mL/min (A) (by C-G formula based on SCr of 1.79 mg/dL (H)).  Liver Function Tests: Recent Labs  Lab 09/06/20 1401  AST 12*  ALT 14  ALKPHOS 120  BILITOT 2.4*  PROT 7.0  ALBUMIN 1.6*    CBG: Recent Labs  Lab 09/07/20 0820 09/07/20 0940 09/07/20 1045 09/07/20 1126 09/07/20 1329  GLUCAP 193* 219* 188* 186* 208*     Recent Results (from the past 240 hour(s))  Resp Panel by RT-PCR (Flu A&B, Covid) Nasopharyngeal Swab     Status: None   Collection Time: 09/06/20  2:01 PM    Specimen: Nasopharyngeal Swab; Nasopharyngeal(NP) swabs in vial transport medium  Result Value Ref Range Status   SARS Coronavirus 2 by RT PCR NEGATIVE NEGATIVE Final    Comment: (NOTE) SARS-CoV-2 target nucleic acids are NOT DETECTED.  The SARS-CoV-2 RNA is generally detectable in upper respiratory specimens during the acute phase of infection. The lowest concentration of SARS-CoV-2 viral copies this assay can detect is 138 copies/mL. A negative result does not preclude SARS-Cov-2 infection and should not be used as the sole basis for treatment or other patient management decisions. A negative result may occur with  improper specimen collection/handling, submission of specimen other than nasopharyngeal swab, presence of viral mutation(s) within the areas targeted by this assay, and inadequate number of viral copies(<138 copies/mL). A negative result must be combined with clinical observations, patient history, and epidemiological information. The expected result is Negative.  Fact Sheet for Patients:  EntrepreneurPulse.com.au  Fact Sheet for Healthcare Providers:  IncredibleEmployment.be  This test is no t yet approved or cleared by the [place] FDA and  has been authorized for detection and/or diagnosis of SARS-CoV-2 by FDA under an Emergency Use Authorization (EUA). This EUA will remain  in effect (meaning this test can be used) for the duration of the COVID-19 declaration under Section 564(b)(1) of the Act, 21 U.S.C.section 360bbb-3(b)(1), unless the authorization is terminated  or revoked sooner.       Influenza A by PCR NEGATIVE NEGATIVE Final   Influenza B by PCR NEGATIVE NEGATIVE Final    Comment: (NOTE) The Xpert Xpress SARS-CoV-2/FLU/RSV plus assay is intended as an aid in the diagnosis of influenza from Nasopharyngeal swab specimens and should not be used as a sole basis for treatment. Nasal washings and aspirates are  unacceptable for Xpert Xpress SARS-CoV-2/FLU/RSV testing.  Fact Sheet for Patients: EntrepreneurPulse.com.au  Fact Sheet for Healthcare Providers: IncredibleEmployment.be  This test is not yet approved or cleared by the [place] FDA and has been authorized for detection and/or diagnosis of SARS-CoV-2 by FDA under an Emergency Use Authorization (EUA). This EUA will remain in effect (meaning this test can be used) for the duration of the COVID-19 declaration under Section 564(b)(1) of the Act, 21 U.S.C. section 360bbb-3(b)(1), unless the authorization is terminated or revoked.  Performed at [facility], [address] [place], [place] [address]   Blood Culture (routine x 2)     Status: None (Preliminary result)   Collection Time: 09/06/20  2:14 PM   Specimen: BLOOD  Result Value Ref Range Status   Specimen Description   Final    BLOOD LEFT ANTECUBITAL Performed at [facility], [address] [place], [place] [address]    Special Requests   Final    BOTTLES DRAWN AEROBIC AND ANAEROBIC Blood Culture adequate volume Performed at [facility], [address] [place], [place] [address]    Culture  Setup Time   Final    GRAM POSITIVE COCCI IN BOTH AEROBIC AND ANAEROBIC BOTTLES Gram Stain Report Called to,Read Back By and Verified With: LARIMORE,[email or web address]  BY MATTHEWS, B 3.3.2022 Performed at [place] ID to follow Performed at [facility], [place] [address] [place], [place] [address]    Culture PENDING  Incomplete   Report Status PENDING  Incomplete  Blood Culture (routine x 2)     Status: None (Preliminary result)   Collection Time: 09/06/20  3:59 PM   Specimen: BLOOD RIGHT HAND  Result Value Ref Range Status   Specimen Description BLOOD RIGHT HAND  Final   Special Requests   Final    BOTTLES DRAWN AEROBIC AND ANAEROBIC Blood Culture adequate volume   Culture   Final    NO GROWTH < 24 HOURS Performed at [facility], [address], [place], [place] [address]    Report Status PENDING  Incomplete  MRSA PCR Screening     Status: None   Collection Time: 09/06/20  9:46 PM   Specimen: Nasal Mucosa; Nasopharyngeal  Result Value Ref Range Status   MRSA by PCR NEGATIVE NEGATIVE Final    Comment:        The GeneXpert MRSA Assay (FDA approved for NASAL specimens only), is one component of a comprehensive MRSA colonization surveillance program. It is not intended to diagnose MRSA infection nor to guide or monitor treatment for MRSA infections. Performed at [facility], [address], [place], [place] [address]      Radiology Studies: MR FOOT RIGHT WO CONTRAST  Result Date: 09/06/2020 CLINICAL DATA:  Wounds on her the that are leaking EXAM: MRI OF THE RIGHT FOREFOOT WITHOUT CONTRAST TECHNIQUE: Multiplanar, multisequence MR imaging of the right was performed. No intravenous contrast was administered. COMPARISON:  None. 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 There is also partially visualized osteoarthritis seen at the calcaneal cuboid joint with joint space loss and subchondral cystic changes. No areas of cortical destruction or periosteal reaction are seen. No large joint effusion is noted. There is a small amount of joint fluid seen between the first and second metatarsal heads. Ligaments The Lisfranc and collateral ligaments are intact. Muscles and Tendons Increased signal with diffuse fatty atrophy of the muscles are noted. The flexor and extensor tendons are intact. A Soft tissues Scattered areas of superficial ulceration seen on the plantar and medial aspect of the forefoot. There is diffuse underlying subcutaneous edema and small amount of subcutaneous emphysema. On the plantar surface beneath the first metatarsal head there is an area of ulceration with question of a small possible sinus tract, series 22, image 20.  IMPRESSION: Several areas of superficial ulceration with edema seen along the medial and plantar aspect of the forefoot underneath the first and second metatarsals. There is 1 area with suggestion of a possible sinus tract/early abscess beneath the first metatarsal head. No definite evidence of osteomyelitis. Electronically Signed   By: Prudencio Pair M.D.   On: 09/06/2020 20:55   MR FOOT LEFT WO CONTRAST  Result Date: 09/06/2020 CLINICAL DATA:  Bleeding and leaking wounds on the plantar surface EXAM: MRI OF THE LEFT FOOT WITHOUT CONTRAST TECHNIQUE: Multiplanar, multisequence MR imaging of the left was performed. No intravenous contrast was administered. COMPARISON:  None. FINDINGS: The study is limited due to motion and technique and there is limited evaluation of the distal phalanges. Bones/Joint/Cartilage Within the visualized portions of the forefoot no areas of cortical destruction or periosteal reaction. There is mildly increased signal seen within the cuboid and navicular is well is the distal first phalanx without associated T1 hypointensity. Small joint effusion seen at the first MTP joint. Ligaments The Lisfranc and collateral ligaments are intact. Muscles and Tendons There is fatty atrophy with increased signal seen diffusely within the forefoot. The visualized portions of the flexor extensor tendons appear to be grossly intact. Soft tissues On the plantar surface beneath the third through fifth digits there areas of superficial ulceration and skin thickening. There is diffuse subcutaneous edema seen on the plantar surface with subcutaneous emphysema. No loculated fluid collections are seen. There is also diffuse skin thickening and dorsal subcutaneous edema. IMPRESSION: Limited evaluation of the distal phalanges. Areas of superficial ulceration seen on the plantar surface of the forefoot with diffuse subcutaneous edema and findings of cellulitis. No definite loculated fluid collections or osteomyelitis.  Electronically Signed   By: Prudencio Pair M.D.   On: 09/06/2020 20:22   DG Chest Port 1 View  Result Date: 09/06/2020 CLINICAL DATA:  Ulcerations to bilateral feet, lower extremity swelling, generalized weakness. EXAM: PORTABLE CHEST 1 VIEW COMPARISON:  Chest x-ray dated 11/15/2019 FINDINGS: Borderline cardiomegaly, stable. Lungs are clear. No pleural effusion or pneumothorax is seen. Osseous structures about the chest are unremarkable. IMPRESSION: 1. No active disease. No evidence of pneumonia or pulmonary edema. 2. Stable borderline cardiomegaly. Electronically Signed   By: Franki Cabot M.D.   On: 09/06/2020 15:20   DG Foot Complete Left  Result Date: 09/06/2020 CLINICAL DATA:  Severe ulcerations to bilateral feet. Lower extremity swelling, generalized weakness. EXAM: LEFT FOOT - COMPLETE 3+ VIEW COMPARISON:  None. FINDINGS: Deformities of the fourth and fifth proximal phalanx, most likely chronic based on configuration and superimposed callus formation.  No acute appearing fracture line or displaced fracture fragment. No destructive changes elsewhere within the LEFT foot to suggest osteomyelitis. Patchy lucencies within the soft tissues along the volar aspect of the LEFT midfoot, predominantly at the level of the tarsals and metatarsals, suspicious for soft tissue gas, presumably related to overlying soft tissue ulceration. Chronic degenerative spurring within the midfoot and hindfoot, mild to moderate in degree, most prominent at the talar beak. IMPRESSION: 1. Evidence of soft tissue gas underlying the LEFT midfoot, presumably related to overlying soft tissue ulceration, necrotizing fasciitis not excluded. 2. No acute appearing osseous abnormality. No destructive changes to confirm an associated osteomyelitis. 3. Chronic appearing fractures/deformities of the fourth and fifth proximal phalanx. Electronically Signed   By: Franki Cabot M.D.   On: 09/06/2020 15:31   DG Foot Complete Right  Result Date:  09/06/2020 CLINICAL DATA:  Weakness, severe ulcerations to bilateral feet, lower extremity swelling. EXAM: RIGHT FOOT COMPLETE - 3+ VIEW COMPARISON:  None. FINDINGS: Osseous alignment is normal. No fracture line or displaced fracture fragment. No acute appearing cortical irregularity or osseous lesion. No destructive change is seen to confirm osteomyelitis. Degenerative spurring within the midfoot and hindfoot, mild to moderate in degree. Vascular calcifications. Presumed soft tissue edema. No evidence of soft tissue gas. IMPRESSION: 1. Presumed soft tissue edema. No evidence of soft tissue gas. 2. No acute appearing osseous abnormality. No evidence of osteomyelitis. Electronically Signed   By: Franki Cabot M.D.   On: 09/06/2020 15:22    Scheduled Meds: . atorvastatin  40 mg Oral QHS  . carvedilol  25 mg Oral BID WC  . Chlorhexidine Gluconate Cloth  6 each Topical Daily  . cholecalciferol  5,000 Units Oral Daily  . enoxaparin (LOVENOX) injection  70 mg Subcutaneous Q24H  . FLUoxetine  40 mg Oral Daily  . hydrALAZINE  25 mg Oral BID  . insulin aspart  0-20 Units Subcutaneous TID WC  . insulin aspart  7 Units Subcutaneous TID WC  . insulin glargine  25 Units Subcutaneous BID  . levothyroxine  150 mcg Oral QAC breakfast  . magnesium oxide  200 mg Oral Daily  . pantoprazole  40 mg Oral Daily  . senna  1 tablet Oral BID   Continuous Infusions: . sodium chloride Stopped (09/07/20 0710)  . sodium chloride 75 mL/hr at 09/07/20 1534  . cefTRIAXone (ROCEPHIN)  IV 2 g (09/07/20 1422)  . vancomycin       LOS: 1 day    Time spent: 35 minutes  Barton Dubois, MD [facility]   To contact the attending provider between 7A-7P or the covering provider during after hours 7P-7A, please log into the web site [email or web address] and access using universal [place] password for that web site. If you do not have the password, please call the hospital operator.  09/07/2020, 4:12 PM

## 2020-09-07 NOTE — Progress Notes (Signed)
Patient agreeable to Dr. Constance Haw completing debridement procedure tomorrow 3/4. Consent signed and placed in chart.

## 2020-09-08 ENCOUNTER — Inpatient Hospital Stay (HOSPITAL_COMMUNITY): Payer: Medicare Other | Admitting: Anesthesiology

## 2020-09-08 ENCOUNTER — Encounter (HOSPITAL_COMMUNITY): Payer: Self-pay | Admitting: Internal Medicine

## 2020-09-08 ENCOUNTER — Other Ambulatory Visit: Payer: Self-pay

## 2020-09-08 ENCOUNTER — Encounter (HOSPITAL_COMMUNITY)
Admission: EM | Disposition: A | Discharge status: Skilled Nursing Facility | Payer: Self-pay | Source: Home / Self Care | Attending: Family Medicine

## 2020-09-08 DIAGNOSIS — K219 Gastro-esophageal reflux disease without esophagitis: Secondary | ICD-10-CM | POA: Diagnosis not present

## 2020-09-08 DIAGNOSIS — A419 Sepsis, unspecified organism: Secondary | ICD-10-CM | POA: Diagnosis not present

## 2020-09-08 DIAGNOSIS — L97522 Non-pressure chronic ulcer of other part of left foot with fat layer exposed: Secondary | ICD-10-CM | POA: Diagnosis not present

## 2020-09-08 DIAGNOSIS — L03119 Cellulitis of unspecified part of limb: Secondary | ICD-10-CM | POA: Diagnosis not present

## 2020-09-08 DIAGNOSIS — L97512 Non-pressure chronic ulcer of other part of right foot with fat layer exposed: Secondary | ICD-10-CM | POA: Diagnosis not present

## 2020-09-08 DIAGNOSIS — F32A Depression, unspecified: Secondary | ICD-10-CM | POA: Diagnosis not present

## 2020-09-08 HISTORY — PX: IRRIGATION AND DEBRIDEMENT FOOT: SHX6602

## 2020-09-08 LAB — GLUCOSE, CAPILLARY
Glucose-Capillary: 292 mg/dL — ABNORMAL HIGH (ref 70–99)
Glucose-Capillary: 296 mg/dL — ABNORMAL HIGH (ref 70–99)
Glucose-Capillary: 275 mg/dL — ABNORMAL HIGH (ref 70–99)
Glucose-Capillary: 302 mg/dL — ABNORMAL HIGH (ref 70–99)
Glucose-Capillary: 296 mg/dL — ABNORMAL HIGH (ref 70–99)

## 2020-09-08 SURGERY — IRRIGATION AND DEBRIDEMENT FOOT
Anesthesia: General | Laterality: Bilateral

## 2020-09-08 MED ORDER — LACTATED RINGERS IV SOLN: INTRAVENOUS | Status: DC

## 2020-09-08 MED ORDER — PROPOFOL 10 MG/ML IV BOLUS
INTRAVENOUS | Status: DC | PRN
  Administered 2020-09-08: 120 mg via INTRAVENOUS
  Administered 2020-09-08: 40 mg via INTRAVENOUS

## 2020-09-08 MED ORDER — FENTANYL CITRATE (PF) 100 MCG/2ML IJ SOLN
INTRAMUSCULAR | Status: DC | PRN
  Administered 2020-09-08: 100 ug via INTRAVENOUS
  Administered 2020-09-08 (×2): 50 ug via INTRAVENOUS

## 2020-09-08 MED ORDER — 0.9 % SODIUM CHLORIDE (POUR BTL) OPTIME
TOPICAL | Status: DC | PRN
  Administered 2020-09-08 (×2): 1000 mL

## 2020-09-08 MED ORDER — LACTATED RINGERS IV SOLN: INTRAVENOUS | Status: DC | PRN

## 2020-09-08 MED ORDER — CEFAZOLIN SODIUM-DEXTROSE 2-4 GM/100ML-% IV SOLN
2.0000 g | Freq: Three times a day (TID) | INTRAVENOUS | Status: DC
  Administered 2020-09-08 – 2020-09-10 (×6): 2 g via INTRAVENOUS
  Filled 2020-09-08 (×9): qty 100

## 2020-09-08 MED ORDER — METOCLOPRAMIDE HCL 5 MG/ML IJ SOLN
INTRAMUSCULAR | Status: DC | PRN
  Administered 2020-09-08: 10 mg via INTRAVENOUS

## 2020-09-08 MED ORDER — GLYCOPYRROLATE PF 0.2 MG/ML IJ SOSY
PREFILLED_SYRINGE | INTRAMUSCULAR | Status: AC
  Filled 2020-09-08: qty 3

## 2020-09-08 MED ORDER — PROMETHAZINE HCL 25 MG/ML IJ SOLN: 6.2500 mg | INTRAMUSCULAR | Status: DC | PRN

## 2020-09-08 MED ORDER — ONDANSETRON HCL 4 MG/2ML IJ SOLN
INTRAMUSCULAR | Status: AC
  Filled 2020-09-08: qty 2

## 2020-09-08 MED ORDER — DAKINS (1/4 STRENGTH) 0.125 % EX SOLN
Freq: Two times a day (BID) | CUTANEOUS | Status: AC
  Filled 2020-09-08 (×3): qty 473

## 2020-09-08 MED ORDER — SUCCINYLCHOLINE CHLORIDE 20 MG/ML IJ SOLN
INTRAMUSCULAR | Status: DC | PRN
  Administered 2020-09-08: 200 mg via INTRAVENOUS

## 2020-09-08 MED ORDER — ROCURONIUM BROMIDE 10 MG/ML (PF) SYRINGE
PREFILLED_SYRINGE | INTRAVENOUS | Status: AC
  Filled 2020-09-08: qty 10

## 2020-09-08 MED ORDER — HYDROMORPHONE HCL 1 MG/ML IJ SOLN: 0.2500 mg | INTRAMUSCULAR | Status: DC | PRN

## 2020-09-08 MED ORDER — METOCLOPRAMIDE HCL 5 MG/ML IJ SOLN
INTRAMUSCULAR | Status: AC
  Filled 2020-09-08: qty 2

## 2020-09-08 MED ORDER — ROCURONIUM BROMIDE 100 MG/10ML IV SOLN
INTRAVENOUS | Status: DC | PRN
  Administered 2020-09-08: 30 mg via INTRAVENOUS

## 2020-09-08 MED ORDER — CHLORHEXIDINE GLUCONATE 0.12 % MT SOLN
15.0000 mL | Freq: Once | OROMUCOSAL | Status: AC
  Administered 2020-09-08: 15 mL via OROMUCOSAL

## 2020-09-08 MED ORDER — PROPOFOL 10 MG/ML IV BOLUS
INTRAVENOUS | Status: AC
  Filled 2020-09-08: qty 20

## 2020-09-08 MED ORDER — LIDOCAINE HCL (PF) 2 % IJ SOLN
INTRAMUSCULAR | Status: AC
  Filled 2020-09-08: qty 5

## 2020-09-08 MED ORDER — PHENYLEPHRINE HCL (PRESSORS) 10 MG/ML IV SOLN
INTRAVENOUS | Status: DC | PRN
  Administered 2020-09-08: 40 ug via INTRAVENOUS
  Administered 2020-09-08: 80 ug via INTRAVENOUS

## 2020-09-08 MED ORDER — ORAL CARE MOUTH RINSE: 15.0000 mL | Freq: Once | OROMUCOSAL | Status: AC

## 2020-09-08 MED ORDER — MORPHINE SULFATE (PF) 2 MG/ML IV SOLN
2.0000 mg | INTRAVENOUS | Status: DC | PRN
  Administered 2020-09-08 – 2020-09-09 (×3): 2 mg via INTRAVENOUS
  Filled 2020-09-08 (×3): qty 1

## 2020-09-08 MED ORDER — SODIUM CHLORIDE (PF) 0.9 % IJ SOLN
INTRAMUSCULAR | Status: AC
  Filled 2020-09-08: qty 10

## 2020-09-08 MED ORDER — FENTANYL CITRATE (PF) 100 MCG/2ML IJ SOLN
INTRAMUSCULAR | Status: AC
  Filled 2020-09-08: qty 2

## 2020-09-08 MED ORDER — DEXAMETHASONE SODIUM PHOSPHATE 10 MG/ML IJ SOLN
INTRAMUSCULAR | Status: AC
  Filled 2020-09-08: qty 1

## 2020-09-08 MED ORDER — MEPERIDINE HCL 50 MG/ML IJ SOLN: 6.2500 mg | INTRAMUSCULAR | Status: DC | PRN

## 2020-09-08 MED ORDER — LIDOCAINE HCL (CARDIAC) PF 50 MG/5ML IV SOSY
PREFILLED_SYRINGE | INTRAVENOUS | Status: DC | PRN
  Administered 2020-09-08: 100 mg via INTRAVENOUS

## 2020-09-08 MED ORDER — SUGAMMADEX SODIUM 500 MG/5ML IV SOLN
INTRAVENOUS | Status: DC | PRN
  Administered 2020-09-08: 200 mg via INTRAVENOUS

## 2020-09-08 MED ORDER — ONDANSETRON HCL 4 MG/2ML IJ SOLN
INTRAMUSCULAR | Status: DC | PRN
  Administered 2020-09-08: 4 mg via INTRAVENOUS

## 2020-09-08 SURGICAL SUPPLY — 23 items
BNDG GAUZE ELAST 4 BULKY (GAUZE/BANDAGES/DRESSINGS) ×2 IMPLANT
CLOTH BEACON ORANGE TIMEOUT ST (SAFETY) ×2 IMPLANT
COVER LIGHT HANDLE STERIS (MISCELLANEOUS) ×4 IMPLANT
COVER WAND RF STERILE (DRAPES) ×2 IMPLANT
DRAPE HALF SHEET 40X57 (DRAPES) ×2 IMPLANT
DRSG ADAPTIC 3X8 NADH LF (GAUZE/BANDAGES/DRESSINGS) ×2 IMPLANT
ELECT REM PT RETURN 9FT ADLT (ELECTROSURGICAL) ×2
ELECTRODE REM PT RTRN 9FT ADLT (ELECTROSURGICAL) ×1 IMPLANT
GAUZE KERLIX 2X3 DERM STRL LF (GAUZE/BANDAGES/DRESSINGS) ×2 IMPLANT
GAUZE SPONGE 4X4 12PLY STRL (GAUZE/BANDAGES/DRESSINGS) ×6 IMPLANT
GLOVE SURG ENC MOIS LTX SZ6.5 (GLOVE) ×3 IMPLANT
GLOVE SURG UNDER POLY LF SZ6.5 (GLOVE) ×3 IMPLANT
GLOVE SURG UNDER POLY LF SZ7 (GLOVE) ×3 IMPLANT
GOWN STRL REUS W/TWL LRG LVL3 (GOWN DISPOSABLE) ×4 IMPLANT
KIT TURNOVER KIT A (KITS) ×2 IMPLANT
MANIFOLD NEPTUNE II (INSTRUMENTS) ×2 IMPLANT
NS IRRIG 1000ML POUR BTL (IV SOLUTION) ×2 IMPLANT
PACK BASIC LIMB (CUSTOM PROCEDURE TRAY) ×2 IMPLANT
PAD ABD 5X9 TENDERSORB (GAUZE/BANDAGES/DRESSINGS) ×4 IMPLANT
PAD ARMBOARD 7.5X6 YLW CONV (MISCELLANEOUS) ×2 IMPLANT
SET BASIN LINEN APH (SET/KITS/TRAYS/PACK) ×2 IMPLANT
SWAB CULTURE ESWAB REG 1ML (MISCELLANEOUS) ×2 IMPLANT
SWAB CULTURE LIQ STUART DBL (MISCELLANEOUS) ×4 IMPLANT

## 2020-09-08 NOTE — Anesthesia Procedure Notes (Signed)
Procedure Name: Intubation Date/Time: 09/08/2020 9:46 AM Performed by: Vista Deck, CRNA Pre-anesthesia Checklist: Patient identified, Emergency Drugs available, Suction available, Patient being monitored and Timeout performed Patient Re-evaluated:Patient Re-evaluated prior to induction Oxygen Delivery Method: Circle system utilized Preoxygenation: Pre-oxygenation with 100% oxygen Induction Type: IV induction Laryngoscope Size: Glidescope and 4 Grade View: Grade I Tube type: Oral Tube size: 7.0 mm Number of attempts: 1 Airway Equipment and Method: Video-laryngoscopy and Stylet Placement Confirmation: ETT inserted through vocal cords under direct vision,  positive ETCO2 and breath sounds checked- equal and bilateral Secured at: 22 cm Tube secured with: Tape Dental Injury: Teeth and Oropharynx as per pre-operative assessment

## 2020-09-08 NOTE — Transfer of Care (Signed)
Immediate Anesthesia Transfer of Care Note  Patient: Michelle Skinner  Procedure(s) Performed: IRRIGATION AND DEBRIDEMENT FOOT (Bilateral )  Patient Location: PACU  Anesthesia Type:General  Level of Consciousness: awake and patient cooperative  Airway & Oxygen Therapy: Patient Spontanous Breathing and non-rebreather face mask  Post-op Assessment: Report given to RN and Post -op Vital signs reviewed and stable  Post vital signs: Reviewed and stable  Last Vitals:  Vitals Value Taken Time  BP 144/120 09/08/20 1141  Temp 98   Pulse 70 09/08/20 1145  Resp 23 09/08/20 1145  SpO2 100 % 09/08/20 1145  Vitals shown include unvalidated device data.  Last Pain:  Vitals:   09/08/20 0831  TempSrc: Oral  PainSc: 0-No pain      Patients Stated Pain Goal: 5 (84/13/24 4010)  Complications: No complications documented.

## 2020-09-08 NOTE — Interval H&P Note (Signed)
History and Physical Interval Note:  09/08/2020 8:58 AM  Michelle Skinner  has presented today for surgery, with the diagnosis of plantar foot ulcer and infection.  The various methods of treatment have been discussed with the patient and family. After consideration of risks, benefits and other options for treatment, the patient has consented to  Procedure(s): IRRIGATION AND DEBRIDEMENT FOOT (Bilateral) as a surgical intervention.  The patient's history has been reviewed, patient examined, no change in status, stable for surgery.  I have reviewed the patient's chart and labs.  Questions were answered to the patient's satisfaction.    Marked both feet.   Michelle Skinner

## 2020-09-08 NOTE — TOC Initial Note (Signed)
Transition of Care [facility]) - Initial/Assessment Note    Patient Details  Name: Michelle Skinner MRN: [id number] Date of Birth: Aug 08, 1965  [facility]) CM/SW Contact:    Ihor Gully, LCSW Phone Number: 09/08/2020, 5:23 PM  Clinical Narrative:                 Patient from home with spouse. May need SNF for wound care. Ambulates independently. Has learner's permit and drives due to spouse having recent amputation. Discussed SNF for wound care. Patient is unsure as to whether she wants to go to SNF for wound care. Spouse states that it is patient's decision.  TOC will follow up regarding discharge plan.   Expected Discharge Plan: Skilled Nursing Facility Barriers to Discharge: Continued Medical Work up   Patient Goals and CMS Choice Patient states their goals for this hospitalization and ongoing recovery are:: unsure currently      Expected Discharge Plan and Services Expected Discharge Plan: [place]       Living arrangements for the past 2 months: Apartment                                      Prior Living Arrangements/Services Living arrangements for the past 2 months: Apartment Lives with:: Spouse Patient language and need for interpreter reviewed:: Yes Do you feel safe going back to the place where you live?: Yes      Need for Family Participation in Patient Care: Yes (Comment) Care giver support system in place?: Yes (comment)   Criminal Activity/Legal Involvement Pertinent to Current Situation/Hospitalization: No - Comment as needed  Activities of Daily Living Home Assistive Devices/Equipment: CBG Meter ADL Screening (condition at time of admission) Patient's cognitive ability adequate to safely complete daily activities?: Yes Is the patient deaf or have difficulty hearing?: No Does the patient have difficulty seeing, even when wearing glasses/contacts?: No Does the patient have difficulty concentrating, remembering, or making  decisions?: No Patient able to express need for assistance with ADLs?: Yes Does the patient have difficulty dressing or bathing?: No Independently performs ADLs?: Yes (appropriate for developmental age) Does the patient have difficulty walking or climbing stairs?: Yes Weakness of Legs: Both Weakness of Arms/Hands: None  Permission Sought/Granted Permission sought to share information with : Family Supports    Share Information with NAME: Mr. Chui, spouse           Emotional Assessment Appearance:: Appears stated age   Affect (typically observed): Appropriate Orientation: : Oriented to Self,Oriented to Place,Oriented to  Time,Oriented to Situation Alcohol / Substance Use: Not Applicable Psych Involvement: No (comment)  Admission diagnosis:  Weakness [R53.1] Sepsis ([place]) [A41.9] Cellulitis of lower extremity, unspecified laterality [L03.119] Sepsis with acute renal failure without septic shock, due to unspecified organism, unspecified acute renal failure type ([place]) [A41.9, R65.20, N17.9] Patient Active Problem List   Diagnosis Date Noted  . Plantar ulcer of right foot ([place])   . Plantar ulcer of left foot ([place])   . Lymphedema   . Sepsis ([place]) 09/06/2020  . Cellulitis 04/27/2020  . AKI (acute kidney injury) ([place]) 04/27/2020  . Stasis dermatitis 04/27/2020  . Anxiety 03/31/2020  . Depression 03/31/2020  . GERD (gastroesophageal reflux disease) 03/31/2020  . Posttraumatic stress disorder 03/31/2020  . Personal history of noncompliance with medical treatment, presenting hazards to health 02/09/2018  . Abnormal CT of the abdomen 10/20/2017  . Dilated cbd,  acquired 10/20/2017  . Essential hypertension, benign 01/14/2017  . Primary hypothyroidism 05/03/2015  . Mixed hyperlipidemia 05/03/2015  . Uncontrolled type 2 diabetes mellitus with complication, with long-term current use of insulin ([place]) 04/25/2015  . Morbid obesity due to excess calories ([place]) 04/25/2015  . Vitamin D  deficiency 04/25/2015  . Cervical cancer ([place]) 10/05/2012   PCP:  Monico Blitz, MD Pharmacy:   [facility], [address] [place] [address] Phone: [phone] Fax: [place], [address], [address] [place], [address] [place] [address] Phone: [phone] Fax: [phone]  [facility] [place], [address] [place] [address] Phone: [phone] Fax: [phone]     Social Determinants of Health (SDOH) Interventions    Readmission Risk Interventions No flowsheet data found.

## 2020-09-08 NOTE — Op Note (Signed)
[facility] Operative Note  09/08/20  Preoperative Diagnosis:  Bilateral plantar foot ulcers with eschar, lymphedema and cellulitis of the lower extremities   Postoperative Diagnosis: Bilateral plantar foot ulcers down to the plantar fascia, lymphedema, cellulitis of the lower extremities   Procedure(s) Performed: Bilateral foot plantar surface excisional sharp debridement of necrotic skin, subcutaneous tissue, adipose tissue down to the plantar fascia ( 219 sq cmt; L foot 11X10X1cm on the plantar surface; R foot 11.5X6X1cm on the plantar surface connecting with another area 10X4X1cm extending onto the right medial plantar surface of the foot); ? ischemic changes to the left 2nd toe with     Surgeon: Michelle Matar. Constance Haw, MD   Assistants: None    Anesthesia: General endotracheal   Anesthesiologist: Denese Killings, MD    Specimens: Cultures left and right foot    Estimated Blood Loss: Minimal   Blood Replacement: None    Complications: None   Wound Class: Dirty infected    Operative Indications: Michelle Skinner is a 55 yo with extensive necrotic blister with eschar on the plantar surface of her bilateral feet in the setting of lymphedema with cellulitis, abscess, but no osteomyelitis on MRI.  CTA demonstrated no major stenosis or occlusive disease. I discussed excisional debridement with her to control the infection and get down to healthy tissue and discussed the issues with healing, ambulating, possibility of additional surgeries and procedures in the future and worsening wounds and infections and risk of bleeding. She opted to proceed.   Findings:Extensive necrotic tissue on the plantar surface with necrotic eschar overlying necrotic tissue down to the plantar fascia with pockets of purulent drainage    Procedure: The patient was taken to the operating room and placed supine. General endotracheal anesthesia was induced. Intravenous antibiotics were not administered  as she has been on antibiotics and cultures were being obtained.  Both feet were was prepared and draped in the usual sterile fashion.   There was extensive necrotic eschar with un-stageable wounds on both the plantar surfaces with the right foot extending onto the medial side of the foot.  Using sharp debridement with a scalpel and scissors, I opened up the eschar and debridement back to healthy skin that was bleeding was performed and down to the plantar fascia as most of the underlying adipose tissue and subcutaneous tissue was liquefied and necrotic with purulence expressed. Sharp excisional debridement was done around the entirety of the wound beds and down to the plantar fascia on both feet. The superficial epidermis that was peeling was scrubbed off the remaining plantar surfaces and dorsum of the feet.  The tissue at the base was viable but was not healthy.  A total of 219 sq cm debridement was done; L foot 11X10X1cm on the plantar surface; R foot 11.5X6X1cm on the plantar surface connecting with another area 10X4X1cm extending onto the right medial plantar surface of the foot.  Final inspection revealed acceptable hemostasis.   Kerlix dampened with diluted dakins was placed in the wound beds. The left second toe was painted with betadine as it appeared to have some ischemic changes at the tip.  No drainage was expressed. Dry gauze between the toes and ABD and Kerlix around the feet.   All counts were correct at the end of the case. The patient was awakened from anesthesia and extubated without complication.  The patient went to the PACU in stable condition. I discussed the case with Dr. Sharol Given who agreed to see the patient for further  care at [facility] given the extent of her disease.    Curlene Labrum, MD [facility] [address] [place], [place] [address] [phone] (office)

## 2020-09-08 NOTE — Progress Notes (Signed)
PROGRESS NOTE    Michelle Skinner  EVO:[id number] DOB: 1965/07/22 DOA: 09/06/2020 PCP: Monico Blitz, MD   Chief Complaint  Patient presents with  . Foot Pain    Brief admission narrative:  As per H&P written by Dr. Linda Hedges on 09/06/2020 Michelle Skinner is a 55 y.o. female with medical history significant of morbid obesity, DM2 on insulin w/ peripheral neuropathy, HTN, GERD. She reports that she incurred "bone bruises" to both feet several weeks ago which have progressed to open wounds. She had progressive weakness and marked pain in both feet. She presents to [facility] for [facility].  ED Course: T 97.5  133/54  HR 108  RR 30  BMI 54.Cmet with Na 124, K 3.9, BUN 52, Cr 1.94 (baseline 1.3), lactic acid 2.1, WBC 25.4 w/ 88/3/3, INR 1.6. Code sepsis initiated 2/2 low BP, tachycardia, AKI, lactic acidosis with source of infection severe bilateral cellulitis involving both feet and possible osteomyelitis: patient received 2L LR, Cefriaxone given  and Vanomycin ordered and given. Patient also with hyperglycemia with serum glucose 591. Glycemic protocol initiated with insulin infusion, K replacement and standard titration orders.   Assessment & Plan: 1-Uncontrolled type 2 diabetes mellitus with complication, with long-term current use of insulin ([place]): Presented with DKA -resolved DKA after IV insulin therapy -continue lantus and SSI -continue to follow CBG's trend -maintain adequate hydration -follow modified carb diet.  2-sepsis secondary to bilateral MSSA feet cellulitis/wounds infection. -Continue current antibiotic therapy, using Ancef base on cultures demonstrating MSSA. -General surgery and orthopedic service consulted (Dr. Sharol Given); will follow recommendations and further inputs as part of wound care/debridement.   3-Primary hypothyroidism -Continue Synthroid  4-Mixed hyperlipidemia -Continue statins  5-Essential hypertension, benign -Soft blood pressure: -Will continue holding  hydralazine -continue adjusted dose of carvedilol -Close monitoring of patient vital signs.  6-acute on chronic renal failure; stage IIIa at baseline -Continue to minimize nephrotoxic agents, hypotension and the use of contrast -Follow-up renal function trend -Continue IV fluids until adequate hydration by mouth achieved -Cr trending down appropriately and towards baseline.   7-moderate obesity -Body mass index is 50.38 kg/m. -Low calorie diet and portion control discussed with patient.  8-depression -No suicidal ideation hallucination -Continue Prozac and trazodone.  9-gastroesophageal flux disease -Continue PPI.   DVT prophylaxis: Lovenox Code Status: Full code Family Communication: No family at bedside. Disposition:   Status is: Inpatient  Dispo: The patient is from: Home              Anticipated d/c is to: To be determined              Patient currently not medically stable for discharge; receiving treatment for DKA, sepsis and ongoing bilateral cellulitic process and feet wounds requiring IV antibiotics and surgical debridement. Patient will be transfer to [facility] for orthopedic evaluation and further wound care.    Difficult to place patient no     Consultants:   General surgery   Orthopedic service (Dr. Sharol Given)   Procedures:  See below for x-ray reports Incision, drainage and surgical debridement of bilateral feet wounds. 09/08/20   Antimicrobials:  Rocephin and vancomycin   Subjective: Off insulin drip and with stable CBG's. Anxious for anticipated surgical debridement today. Morbidly obese, afebrile and in not major distress currently. Continue to complaint of bilateral feet pain.  Objective: Vitals:   09/08/20 0800 09/08/20 0831 09/08/20 1141 09/08/20 1200  BP: 139/64 131/63  (!) 103/47  Pulse: 73 69  73  Resp: 13 (!) 22  (!)  24  Temp: (!) 97.5 F (36.4 C) 97.7 F (36.5 C) 98 F (36.7 C)   TempSrc: Oral Oral    SpO2: 95% 97%  100%  Weight:       Height:        Intake/Output Summary (Last 24 hours) at 09/08/2020 1224 Last data filed at 09/08/2020 1123 Gross per 24 hour  Intake 2475.28 ml  Output 400 ml  Net 2075.28 ml   Filed Weights   09/06/20 1249 09/06/20 2200  Weight: (!) 158.8 kg (!) 148.1 kg    Examination: General exam: Alert, awake, oriented x 3; reports intermittent feet pain. No fever, no CP, no nausea, no vomiting. Off insulin drip and with stable CBG's currently. Respiratory system: Clear to auscultation. Respiratory effort normal. No using accessory muscles. Cardiovascular system:RRR. No murmurs, rubs, gallops. Unable to assess JVD with body habitus.  Gastrointestinal system: Abdomen is obese, nondistended, soft and nontender. No organomegaly or masses felt. Normal bowel sounds heard. Central nervous system: Alert and oriented. No focal neurological deficits. Extremities/skin: chronic lymphedema bilaterally, also with bilateral chronic stasis dermatitis, and ascending erythematous changes. Both feet with ongoing desquamating necrotic changes and serosanguineous drainage. Psychiatry: Judgement and insight appear normal. Mood & affect appropriate.    Data Reviewed: I have personally reviewed following labs and imaging studies  CBC: Recent Labs  Lab 09/06/20 1401 09/07/20 0622  WBC 25.4* 21.2*  NEUTROABS 22.1*  --   HGB 9.8* 8.8*  HCT 31.6* 27.3*  MCV 91.1 86.9  PLT 402* 893    Basic Metabolic Panel: Recent Labs  Lab 09/06/20 1401 09/07/20 0622 09/07/20 1126  NA 124* 131* 132*  K 3.9 3.4* 3.3*  CL 88* 99 98  CO2 10* 21* 22  GLUCOSE 591* 221* 192*  BUN 52* 52* 51*  CREATININE 1.94* 1.81* 1.79*  CALCIUM 8.8* 7.7* 7.8*    GFR: Estimated Creatinine Clearance: 55.6 mL/min (A) (by C-G formula based on SCr of 1.79 mg/dL (H)).  Liver Function Tests: Recent Labs  Lab 09/06/20 1401  AST 12*  ALT 14  ALKPHOS 120  BILITOT 2.4*  PROT 7.0  ALBUMIN 1.6*    CBG: Recent Labs  Lab 09/07/20 1622  09/07/20 2116 09/08/20 0800 09/08/20 0822 09/08/20 1204  GLUCAP 228* 250* 296* 292* 275*    Recent Results (from the past 240 hour(s))  Resp Panel by RT-PCR (Flu A&B, Covid) Nasopharyngeal Swab     Status: None   Collection Time: 09/06/20  2:01 PM   Specimen: Nasopharyngeal Swab; Nasopharyngeal(NP) swabs in vial transport medium  Result Value Ref Range Status   SARS Coronavirus 2 by RT PCR NEGATIVE NEGATIVE Final    Comment: (NOTE) SARS-CoV-2 target nucleic acids are NOT DETECTED.  The SARS-CoV-2 RNA is generally detectable in upper respiratory specimens during the acute phase of infection. The lowest concentration of SARS-CoV-2 viral copies this assay can detect is 138 copies/mL. A negative result does not preclude SARS-Cov-2 infection and should not be used as the sole basis for treatment or other patient management decisions. A negative result may occur with  improper specimen collection/handling, submission of specimen other than nasopharyngeal swab, presence of viral mutation(s) within the areas targeted by this assay, and inadequate number of viral copies(<138 copies/mL). A negative result must be combined with clinical observations, patient history, and epidemiological information. The expected result is Negative.  Fact Sheet for Patients:  EntrepreneurPulse.com.au  Fact Sheet for Healthcare Providers:  IncredibleEmployment.be  This test is no t yet approved or cleared  by the [place] and  has been authorized for detection and/or diagnosis of SARS-CoV-2 by FDA under an Emergency Use Authorization (EUA). This EUA will remain  in effect (meaning this test can be used) for the duration of the COVID-19 declaration under Section 564(b)(1) of the Act, 21 U.S.C.section 360bbb-3(b)(1), unless the authorization is terminated  or revoked sooner.       Influenza A by PCR NEGATIVE NEGATIVE Final   Influenza B by PCR NEGATIVE  NEGATIVE Final    Comment: (NOTE) The Xpert Xpress SARS-CoV-2/FLU/RSV plus assay is intended as an aid in the diagnosis of influenza from Nasopharyngeal swab specimens and should not be used as a sole basis for treatment. Nasal washings and aspirates are unacceptable for Xpert Xpress SARS-CoV-2/FLU/RSV testing.  Fact Sheet for Patients: EntrepreneurPulse.com.au  Fact Sheet for Healthcare Providers: IncredibleEmployment.be  This test is not yet approved or cleared by the [place] FDA and has been authorized for detection and/or diagnosis of SARS-CoV-2 by FDA under an Emergency Use Authorization (EUA). This EUA will remain in effect (meaning this test can be used) for the duration of the COVID-19 declaration under Section 564(b)(1) of the Act, 21 U.S.C. section 360bbb-3(b)(1), unless the authorization is terminated or revoked.  Performed at [facility], [address] [place], [place] [address]   Urine culture     Status: Abnormal (Preliminary result)   Collection Time: 09/06/20  2:01 PM   Specimen: In/Out Cath Urine  Result Value Ref Range Status   Specimen Description   Final    IN/OUT CATH URINE Performed at [facility], [address], [place], [place] [address]    Special Requests   Final    NONE Performed at [facility], [address], [place], [place] [address]    Culture (A)  Final    1,000 COLONIES/mL PROTEUS MIRABILIS 3,000 COLONIES/mL GRAM NEGATIVE RODS SUSCEPTIBILITIES TO FOLLOW Performed at [facility], [place] [address] [place], [place] [address]    Report Status PENDING  Incomplete  Blood Culture (routine x 2)     Status: Abnormal (Preliminary result)   Collection Time: 09/06/20  2:14 PM   Specimen: BLOOD  Result Value Ref Range Status   Specimen Description   Final    BLOOD LEFT ANTECUBITAL Performed at [facility], [address], [place], [place] [address]    Special Requests   Final    BOTTLES DRAWN  AEROBIC AND ANAEROBIC Blood Culture adequate volume Performed at [facility], [address], [place], [place] [address]    Culture  Setup Time   Final    GRAM POSITIVE COCCI IN BOTH AEROBIC AND ANAEROBIC BOTTLES Gram Stain Report Called to,Read Back By and Verified With: LARIMORE,A@0725  BY MATTHEWS, B 3.3.2022 Performed at [facility] ID to follow CRITICAL RESULT CALLED TO, READ BACK BY AND VERIFIED WITH: Martin Majestic RN [id number] 09/07/20 A BROWNING    Culture (A)  Final    STAPHYLOCOCCUS AUREUS SUSCEPTIBILITIES TO FOLLOW Performed at [facility], [place] [address] [place] [address], [place],  [address]    Report Status PENDING  Incomplete  Blood Culture ID Panel (Reflexed)     Status: Abnormal   Collection Time: 09/06/20  2:14 PM  Result Value Ref Range Status   Enterococcus faecalis NOT DETECTED NOT DETECTED Final   Enterococcus Faecium NOT DETECTED NOT DETECTED Final   Listeria monocytogenes NOT DETECTED NOT DETECTED Final   Staphylococcus species DETECTED (A) NOT DETECTED Final    Comment:  CRITICAL RESULT CALLED TO, READ BACK BY AND VERIFIED WITH: Martin Majestic RN 1610 09/07/20 A BROWNING    Staphylococcus aureus (BCID) DETECTED (A) NOT DETECTED Final    Comment: CRITICAL RESULT CALLED TO, READ BACK BY AND VERIFIED WITH: Martin Majestic RN [id number] 09/07/20 A BROWNING    Staphylococcus epidermidis NOT DETECTED NOT DETECTED Final   Staphylococcus lugdunensis NOT DETECTED NOT DETECTED Final   Streptococcus species NOT DETECTED NOT DETECTED Final   Streptococcus agalactiae NOT DETECTED NOT DETECTED Final   Streptococcus pneumoniae NOT DETECTED NOT DETECTED Final   Streptococcus pyogenes NOT DETECTED NOT DETECTED Final   A.calcoaceticus-baumannii NOT DETECTED NOT DETECTED Final   Bacteroides fragilis NOT DETECTED NOT DETECTED Final   Enterobacterales NOT DETECTED NOT DETECTED Final   Enterobacter cloacae complex NOT DETECTED NOT DETECTED Final   Escherichia coli NOT DETECTED NOT DETECTED Final    Klebsiella aerogenes NOT DETECTED NOT DETECTED Final   Klebsiella oxytoca NOT DETECTED NOT DETECTED Final   Klebsiella pneumoniae NOT DETECTED NOT DETECTED Final   Proteus species NOT DETECTED NOT DETECTED Final   Salmonella species NOT DETECTED NOT DETECTED Final   Serratia marcescens NOT DETECTED NOT DETECTED Final   Haemophilus influenzae NOT DETECTED NOT DETECTED Final   Neisseria meningitidis NOT DETECTED NOT DETECTED Final   Pseudomonas aeruginosa NOT DETECTED NOT DETECTED Final   Stenotrophomonas maltophilia NOT DETECTED NOT DETECTED Final   Candida albicans NOT DETECTED NOT DETECTED Final   Candida auris NOT DETECTED NOT DETECTED Final   Candida glabrata NOT DETECTED NOT DETECTED Final   Candida krusei NOT DETECTED NOT DETECTED Final   Candida parapsilosis NOT DETECTED NOT DETECTED Final   Candida tropicalis NOT DETECTED NOT DETECTED Final   Cryptococcus neoformans/gattii NOT DETECTED NOT DETECTED Final   Meth resistant mecA/C and MREJ NOT DETECTED NOT DETECTED Final    Comment: Performed at [facility], [address], [place], [place] [address]  Blood Culture (routine x 2)     Status: None (Preliminary result)   Collection Time: 09/06/20  3:59 PM   Specimen: BLOOD RIGHT HAND  Result Value Ref Range Status   Specimen Description BLOOD RIGHT HAND  Final   Special Requests   Final    BOTTLES DRAWN AEROBIC AND ANAEROBIC Blood Culture adequate volume   Culture   Final    NO GROWTH < 24 HOURS Performed at [facility], [address] [place], [place] [address]    Report Status PENDING  Incomplete  MRSA PCR Screening     Status: None   Collection Time: 09/06/20  9:46 PM   Specimen: Nasal Mucosa; Nasopharyngeal  Result Value Ref Range Status   MRSA by PCR NEGATIVE NEGATIVE Final    Comment:        The GeneXpert MRSA Assay (FDA approved for NASAL specimens only), is one component of a comprehensive MRSA colonization surveillance program. It is not intended to  diagnose MRSA infection nor to guide or monitor treatment for MRSA infections. Performed at [facility], [address] [place], [place] [address]      Radiology Studies: CT ANGIO AO+BIFEM W & OR WO CONTRAST  Result Date: 09/07/2020 CLINICAL DATA:  Bilateral lower extremity edema and foot ulcers EXAM: CT ANGIOGRAPHY OF ABDOMINAL AORTA WITH ILIOFEMORAL RUNOFF TECHNIQUE: Multidetector CT imaging of the abdomen, pelvis and lower extremities was performed using the standard protocol during bolus administration of intravenous contrast. Multiplanar CT image reconstructions and MIPs were obtained to evaluate the vascular anatomy. CONTRAST:  146mL OMNIPAQUE IOHEXOL 350 MG/ML SOLN COMPARISON:  None. FINDINGS: VASCULAR Aorta: Normal caliber aorta without aneurysm, dissection, vasculitis or significant stenosis. Minimal trace atherosclerotic calcifications. Celiac: Patent without evidence of aneurysm, dissection, vasculitis or significant stenosis. SMA: Patent without evidence of aneurysm, dissection, vasculitis or significant stenosis. Renals: Both renal arteries are patent without evidence of aneurysm, dissection, vasculitis, fibromuscular dysplasia or significant stenosis. 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 IMA: Patent without evidence of aneurysm, dissection, vasculitis or significant stenosis. RIGHT Lower Extremity Inflow: Common, internal and external iliac arteries are patent without evidence of aneurysm, dissection, vasculitis or significant stenosis. Outflow: Common, superficial and profunda femoral arteries and the popliteal artery are patent without evidence of aneurysm, dissection, vasculitis or significant stenosis. Runoff: Patent three vessel runoff to the ankle. LEFT Lower Extremity Inflow: Common, internal and external iliac arteries are patent without evidence of aneurysm, dissection, vasculitis or significant stenosis. Outflow: Common, superficial and profunda femoral arteries and the  popliteal artery are patent without evidence of aneurysm, dissection, vasculitis or significant stenosis. Runoff: Patent three vessel runoff to the ankle. Veins: Markedly enlarged bilateral superficial great saphenous veins with multiple collateral branches in the calves. Review of the MIP images confirms the above findings. NON-VASCULAR Lower chest: No acute abnormality. Hepatobiliary: Low attenuation of the hepatic parenchyma with subtle sparing around the gallbladder fossa consistent with steatosis. Pancreas: Unremarkable. No pancreatic ductal dilatation or surrounding inflammatory changes. Spleen: Normal in size without focal abnormality. Adrenals/Urinary Tract: Adrenal glands are unremarkable. Kidneys are normal, without renal calculi, focal lesion, or hydronephrosis. Bladder is unremarkable. Stomach/Bowel: Stomach is within normal limits. Appendix appears normal. No evidence of bowel wall thickening, distention, or inflammatory changes. Lymphatic: No suspicious lymphadenopathy. Mildly enlarged bilateral superficial inguinal lymph nodes likely reactive in nature. Reproductive: Status post hysterectomy. No adnexal masses. Other: No abdominal wall hernia or abnormality. No abdominopelvic ascites. Musculoskeletal: Extensive bilateral lower extremity edema beginning just below the knee on the left, and in the mid calf on the right. A fluid collection is present within internal dystrophic calcification in the medial aspect of the left popliteal fossa consistent with a Baker's cyst. Subcutaneous emphysema present along the plantar aspect of the left foot consistent with reported ulcerations. IMPRESSION: VASCULAR 1. No evidence of hemodynamically significant arterial stenosis or occlusion. 2. Enlarged great saphenous veins bilaterally with multiple collaterals. Findings are highly suggestive of superficial venous insufficiency. This may be a contributing factor to the patient's lower extremity swelling and  ulcerations. 3. Mild atherosclerotic plaque along the abdominal aorta. Aortic Atherosclerosis (ICD10-170.0) NON-VASCULAR 1. Hepatic steatosis. 2. Left-sided Baker's cyst. 3. Left greater than right lower extremity edema. 4. Subcutaneous emphysema along the plantar surface of the left foot consistent with the clinical history of soft tissue ulcerations. Signed, Criselda Peaches, MD, [facility] Radiology Electronically Signed   By: Jacqulynn Cadet M.D.   On: 09/07/2020 16:18   MR FOOT RIGHT WO CONTRAST  Result Date: 09/06/2020 CLINICAL DATA:  Wounds on her the that are leaking EXAM: MRI OF THE RIGHT FOREFOOT WITHOUT CONTRAST TECHNIQUE: Multiplanar, multisequence MR imaging of the right was performed. No intravenous contrast was administered. COMPARISON:  None. 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 There is also partially visualized osteoarthritis seen at the calcaneal cuboid joint with joint space loss and subchondral cystic changes. No areas of cortical destruction or periosteal reaction are seen. No large joint effusion is noted. There is a small  amount of joint fluid seen between the first and second metatarsal heads. Ligaments The Lisfranc and collateral ligaments are intact. Muscles and Tendons Increased signal with diffuse fatty atrophy of the muscles are noted. The flexor and extensor tendons are intact. A Soft tissues Scattered areas of superficial ulceration seen on the plantar and medial aspect of the forefoot. There is diffuse underlying subcutaneous edema and small amount of subcutaneous emphysema. On the plantar surface beneath the first metatarsal head there is an area of ulceration with question of a small possible sinus tract, series 22, image 20. IMPRESSION: Several areas of superficial ulceration with edema seen along the medial and plantar aspect of  the forefoot underneath the first and second metatarsals. There is 1 area with suggestion of a possible sinus tract/early abscess beneath the first metatarsal head. No definite evidence of osteomyelitis. Electronically Signed   By: Prudencio Pair M.D.   On: 09/06/2020 20:55   MR FOOT LEFT WO CONTRAST  Result Date: 09/06/2020 CLINICAL DATA:  Bleeding and leaking wounds on the plantar surface EXAM: MRI OF THE LEFT FOOT WITHOUT CONTRAST TECHNIQUE: Multiplanar, multisequence MR imaging of the left was performed. No intravenous contrast was administered. COMPARISON:  None. FINDINGS: The study is limited due to motion and technique and there is limited evaluation of the distal phalanges. Bones/Joint/Cartilage Within the visualized portions of the forefoot no areas of cortical destruction or periosteal reaction. There is mildly increased signal seen within the cuboid and navicular is well is the distal first phalanx without associated T1 hypointensity. Small joint effusion seen at the first MTP joint. Ligaments The Lisfranc and collateral ligaments are intact. Muscles and Tendons There is fatty atrophy with increased signal seen diffusely within the forefoot. The visualized portions of the flexor extensor tendons appear to be grossly intact. Soft tissues On the plantar surface beneath the third through fifth digits there areas of superficial ulceration and skin thickening. There is diffuse subcutaneous edema seen on the plantar surface with subcutaneous emphysema. No loculated fluid collections are seen. There is also diffuse skin thickening and dorsal subcutaneous edema. IMPRESSION: Limited evaluation of the distal phalanges. Areas of superficial ulceration seen on the plantar surface of the forefoot with diffuse subcutaneous edema and findings of cellulitis. No definite loculated fluid collections or osteomyelitis. Electronically Signed   By: Prudencio Pair M.D.   On: 09/06/2020 20:22   DG Chest Port 1 View  Result  Date: 09/06/2020 CLINICAL DATA:  Ulcerations to bilateral feet, lower extremity swelling, generalized weakness. EXAM: PORTABLE CHEST 1 VIEW COMPARISON:  Chest x-ray dated 11/15/2019 FINDINGS: Borderline cardiomegaly, stable. Lungs are clear. No pleural effusion or pneumothorax is seen. Osseous structures about the chest are unremarkable. IMPRESSION: 1. No active disease. No evidence of pneumonia or pulmonary edema. 2. Stable borderline cardiomegaly. Electronically Signed   By: Franki Cabot M.D.   On: 09/06/2020 15:20   DG Foot Complete Left  Result Date: 09/06/2020 CLINICAL DATA:  Severe ulcerations to bilateral feet. Lower extremity swelling, generalized weakness. EXAM: LEFT FOOT - COMPLETE 3+ VIEW COMPARISON:  None. FINDINGS: Deformities of the fourth and fifth proximal phalanx, most likely chronic based on configuration and superimposed callus formation. No acute appearing fracture line or displaced fracture fragment. No destructive changes elsewhere within the LEFT foot to suggest osteomyelitis. Patchy lucencies within the soft tissues along the volar aspect of the LEFT midfoot, predominantly at the level of the tarsals and metatarsals, suspicious for soft tissue gas, presumably related to overlying soft tissue ulceration. Chronic  degenerative spurring within the midfoot and hindfoot, mild to moderate in degree, most prominent at the talar beak. IMPRESSION: 1. Evidence of soft tissue gas underlying the LEFT midfoot, presumably related to overlying soft tissue ulceration, necrotizing fasciitis not excluded. 2. No acute appearing osseous abnormality. No destructive changes to confirm an associated osteomyelitis. 3. Chronic appearing fractures/deformities of the fourth and fifth proximal phalanx. Electronically Signed   By: Franki Cabot M.D.   On: 09/06/2020 15:31   DG Foot Complete Right  Result Date: 09/06/2020 CLINICAL DATA:  Weakness, severe ulcerations to bilateral feet, lower extremity swelling. EXAM:  RIGHT FOOT COMPLETE - 3+ VIEW COMPARISON:  None. FINDINGS: Osseous alignment is normal. No fracture line or displaced fracture fragment. No acute appearing cortical irregularity or osseous lesion. No destructive change is seen to confirm osteomyelitis. Degenerative spurring within the midfoot and hindfoot, mild to moderate in degree. Vascular calcifications. Presumed soft tissue edema. No evidence of soft tissue gas. IMPRESSION: 1. Presumed soft tissue edema. No evidence of soft tissue gas. 2. No acute appearing osseous abnormality. No evidence of osteomyelitis. Electronically Signed   By: Franki Cabot M.D.   On: 09/06/2020 15:22    Scheduled Meds: . [MAR Hold] atorvastatin  40 mg Oral QHS  . [MAR Hold] carvedilol  12.5 mg Oral BID WC  . chlorhexidine  15 mL Mouth/Throat Once   Or  . mouth rinse  15 mL Mouth Rinse Once  . [MAR Hold] Chlorhexidine Gluconate Cloth  6 each Topical Daily  . [MAR Hold] cholecalciferol  5,000 Units Oral Daily  . [MAR Hold] enoxaparin (LOVENOX) injection  70 mg Subcutaneous Q24H  . [MAR Hold] FLUoxetine  40 mg Oral Daily  . [MAR Hold] insulin aspart  0-20 Units Subcutaneous TID WC  . [MAR Hold] insulin aspart  7 Units Subcutaneous TID WC  . [MAR Hold] insulin glargine  25 Units Subcutaneous BID  . [MAR Hold] levothyroxine  150 mcg Oral QAC breakfast  . [MAR Hold] magnesium oxide  200 mg Oral Daily  . [MAR Hold] pantoprazole  40 mg Oral Daily  . [MAR Hold] senna  1 tablet Oral BID   Continuous Infusions: . sodium chloride Stopped (09/07/20 0710)  . sodium chloride 75 mL/hr at 09/08/20 0740  .  ceFAZolin (ANCEF) IV    . lactated ringers       LOS: 2 days    Time spent: 35 minutes  Barton Dubois, MD [facility]   To contact the attending provider between 7A-7P or the covering provider during after hours 7P-7A, please log into the web site [email or web address] and access using universal [place] password for that web site. If you do not have the  password, please call the hospital operator.  09/08/2020, 12:24 PM

## 2020-09-08 NOTE — Progress Notes (Signed)
Pt transferred back to ICU 04 from PACU. Pt alert and oriented X 4. No complaints of pain or discomfort at this time.

## 2020-09-08 NOTE — Progress Notes (Signed)
[facility]  Discussed case with Dr. Sharol Given and Dr. Dyann Kief and given the extent of disease we will get her transferred for further care. Debridement done and on antibiotics. Dakins dressing placed and changes ordered until care taken over by Dr. Sharol Given.  Curlene Labrum, MD [facility] [address] [place], [place] [address] [phone] (office)

## 2020-09-08 NOTE — Anesthesia Postprocedure Evaluation (Signed)
Anesthesia Post Note  Patient: Michelle Skinner  Procedure(s) Performed: IRRIGATION AND DEBRIDEMENT FOOT (Bilateral )  Patient location during evaluation: PACU Anesthesia Type: General Level of consciousness: awake and alert and oriented Pain management: pain level controlled Vital Signs Assessment: post-procedure vital signs reviewed and stable Respiratory status: spontaneous breathing and respiratory function stable Cardiovascular status: blood pressure returned to baseline Postop Assessment: no apparent nausea or vomiting Anesthetic complications: no   No complications documented.   Last Vitals:  Vitals:   09/08/20 1200 09/08/20 1215  BP: (!) 103/47 (!) 107/56  Pulse: 73 73  Resp: (!) 24 (!) 21  Temp:    SpO2: 100% 98%    Last Pain:  Vitals:   09/08/20 1215  TempSrc:   PainSc: Asleep                 Rajamani C Battula

## 2020-09-08 NOTE — Consult Note (Signed)
New MSSA bacteremia from leg source.  Patient being transferred to [facility] for further care.  Will follow up with patient once in [place] and presently on appropriate therapy.   Thayer Headings, MD

## 2020-09-08 NOTE — Anesthesia Preprocedure Evaluation (Signed)
Anesthesia Evaluation  Patient identified by MRN, date of birth, ID band Patient awake    Reviewed: Allergy & Precautions, NPO status , Patient's Chart, lab work & pertinent test results  History of Anesthesia Complications Negative for: history of anesthetic complications  Airway Mallampati: III  TM Distance: >3 FB Neck ROM: Full    Dental  (+) Dental Advisory Given, Missing, Poor Dentition, Chipped   Pulmonary neg pulmonary ROS,           Cardiovascular Exercise Tolerance: Poor hypertension, Pt. on medications  Rhythm:Regular Rate:Normal     Neuro/Psych PSYCHIATRIC DISORDERS Anxiety Depression negative neurological ROS     GI/Hepatic Neg liver ROS, GERD  Medicated and Controlled,  Endo/Other  diabetes, Poorly Controlled, Type 2, Insulin DependentHypothyroidism   Renal/GU Renal InsufficiencyRenal disease (AKI)     Musculoskeletal negative musculoskeletal ROS (+)   Abdominal   Peds  Hematology  (+) anemia ,   Anesthesia Other Findings   Reproductive/Obstetrics negative OB ROS                            Anesthesia Physical Anesthesia Plan  ASA: IV  Anesthesia Plan: General   Post-op Pain Management:    Induction: Intravenous  PONV Risk Score and Plan: 4 or greater and Ondansetron and Metaclopromide  Airway Management Planned: Oral ETT  Additional Equipment:   Intra-op Plan:   Post-operative Plan:   Informed Consent: I have reviewed the patients History and Physical, chart, labs and discussed the procedure including the risks, benefits and alternatives for the proposed anesthesia with the patient or authorized representative who has indicated his/her understanding and acceptance.     Dental advisory given  Plan Discussed with: CRNA and Surgeon  Anesthesia Plan Comments:         Anesthesia Quick Evaluation

## 2020-09-09 DIAGNOSIS — L97523 Non-pressure chronic ulcer of other part of left foot with necrosis of muscle: Secondary | ICD-10-CM | POA: Diagnosis not present

## 2020-09-09 DIAGNOSIS — L03119 Cellulitis of unspecified part of limb: Secondary | ICD-10-CM

## 2020-09-09 DIAGNOSIS — I890 Lymphedema, not elsewhere classified: Secondary | ICD-10-CM | POA: Diagnosis not present

## 2020-09-09 DIAGNOSIS — E1165 Type 2 diabetes mellitus with hyperglycemia: Secondary | ICD-10-CM

## 2020-09-09 DIAGNOSIS — B9561 Methicillin susceptible Staphylococcus aureus infection as the cause of diseases classified elsewhere: Secondary | ICD-10-CM

## 2020-09-09 DIAGNOSIS — E118 Type 2 diabetes mellitus with unspecified complications: Secondary | ICD-10-CM | POA: Diagnosis not present

## 2020-09-09 DIAGNOSIS — L03116 Cellulitis of left lower limb: Secondary | ICD-10-CM

## 2020-09-09 DIAGNOSIS — B952 Enterococcus as the cause of diseases classified elsewhere: Secondary | ICD-10-CM

## 2020-09-09 DIAGNOSIS — L03115 Cellulitis of right lower limb: Secondary | ICD-10-CM | POA: Diagnosis not present

## 2020-09-09 DIAGNOSIS — I10 Essential (primary) hypertension: Secondary | ICD-10-CM

## 2020-09-09 DIAGNOSIS — L97513 Non-pressure chronic ulcer of other part of right foot with necrosis of muscle: Secondary | ICD-10-CM | POA: Diagnosis not present

## 2020-09-09 DIAGNOSIS — Z6841 Body Mass Index (BMI) 40.0 and over, adult: Secondary | ICD-10-CM

## 2020-09-09 DIAGNOSIS — E11621 Type 2 diabetes mellitus with foot ulcer: Secondary | ICD-10-CM | POA: Diagnosis not present

## 2020-09-09 DIAGNOSIS — E43 Unspecified severe protein-calorie malnutrition: Secondary | ICD-10-CM

## 2020-09-09 DIAGNOSIS — Z794 Long term (current) use of insulin: Secondary | ICD-10-CM

## 2020-09-09 DIAGNOSIS — A4101 Sepsis due to Methicillin susceptible Staphylococcus aureus: Principal | ICD-10-CM

## 2020-09-09 LAB — URINE CULTURE: Culture: 1000 — AB

## 2020-09-09 LAB — CULTURE, BLOOD (ROUTINE X 2): Special Requests: ADEQUATE

## 2020-09-09 LAB — GLUCOSE, CAPILLARY
Glucose-Capillary: 158 mg/dL — ABNORMAL HIGH (ref 70–99)
Glucose-Capillary: 189 mg/dL — ABNORMAL HIGH (ref 70–99)
Glucose-Capillary: 228 mg/dL — ABNORMAL HIGH (ref 70–99)
Glucose-Capillary: 250 mg/dL — ABNORMAL HIGH (ref 70–99)
Glucose-Capillary: 263 mg/dL — ABNORMAL HIGH (ref 70–99)

## 2020-09-09 MED ORDER — INSULIN ASPART 100 UNIT/ML ~~LOC~~ SOLN
12.0000 [IU] | Freq: Three times a day (TID) | SUBCUTANEOUS | Status: DC
  Administered 2020-09-09: 6 [IU] via SUBCUTANEOUS

## 2020-09-09 MED ORDER — SILVER SULFADIAZINE 1 % EX CREA
TOPICAL_CREAM | Freq: Every day | CUTANEOUS | Status: DC
  Filled 2020-09-09: qty 85

## 2020-09-09 NOTE — Progress Notes (Signed)
Patient's Spouse Michelle Skinner notified of transfer to [facility] 5N bed 29. Patient's rings were left behind after transportation. Rings sent down with security. Spouse notified about belongings.

## 2020-09-09 NOTE — Progress Notes (Signed)
PROGRESS NOTE    Michelle Skinner  HLK:[id number] DOB: October 18, 1965 DOA: 09/06/2020 PCP: Monico Blitz, MD   Chief Complaint  Patient presents with  . Foot Pain    Brief admission narrative:  55 y.o. female with medical history significant of morbid obesity, DM2 on insulin w/ peripheral neuropathy, HTN, GERD. She reports that she incurred "bone bruises" to both feet several weeks ago which have progressed to open wounds. She had progressive weakness and marked pain in both feet. She presented to [facility].  Pt had initial debridement by Dr. Constance Haw at [facility] and subsequently transferred to [facility] for consult with Dr. Sharol Given.    Assessment & Plan: 1-Uncontrolled type 2 diabetes mellitus with complication, with long-term current use of insulin: Presented with DKA -DKA resolved after IV insulin infusion -continue lantus and SSI, increase prandial coverage to 12 units TID with meals -continue to follow CBG's trend -maintain adequate hydration -follow modified carb diet. CBG (last 3)  Recent Labs    09/09/20 0452 09/09/20 0823 09/09/20 1127  GLUCAP 228* 263* 250*    2-sepsis secondary to bilateral MSSA Bacteremia, cellulitis of both feet and legs -Appreciate ID consult, order TTE, continue IV cefazolin.   -General surgery and orthopedic service consulted (Dr. Sharol Given); will follow recommendations and further inputs as part of wound care/debridement.  Pt not amenable to amputation at this time.  Dr. Sharol Given planning to take to OR for further debridement 3/6.    3-Primary hypothyroidism -Continue Synthroid  4-Mixed hyperlipidemia -Continue statins  5-Essential hypertension, benign -Intermittent soft blood pressures, stable, asymptomatic.  -Continue holding hydralazine -continue adjusted dose of carvedilol -Close monitoring of patient vital signs.  6-acute on chronic renal failure; stage IIIa at baseline -Continue to minimize nephrotoxic agents, hypotension and the use of  contrast -Follow-up renal function trend -Continue IV fluids until adequate hydration by mouth achieved -Cr trending down appropriately and towards baseline.   7-moderate obesity -Body mass index is 50.38 kg/m. -Low calorie diet and portion control discussed with patient.  8-depression -No suicidal ideation hallucination -Continue Prozac and trazodone.  9-gastroesophageal flux disease -Continue PPI.   DVT prophylaxis: Lovenox Code Status: Full code Family Communication: attempted to t/c husband 3/5 Disposition:  TBD  Status is: Inpatient  Dispo: The patient is from: Home              Anticipated d/c is to: To be determined              Patient currently not medically stable for discharge; receiving treatment for DKA, sepsis and ongoing bilateral cellulitic process and feet wounds requiring IV antibiotics and surgical debridement. Patient will be transfer to [facility] for orthopedic evaluation and further wound care.    Difficult to place patient no   Consultants:   General surgery   Orthopedic service (Dr. Sharol Given)   Procedures:  See below for x-ray reports Incision, drainage and surgical debridement of bilateral feet wounds. 09/08/20   Antimicrobials:  Rocephin and vancomycin   Subjective: Pt reports pain in feet and legs.    Objective: Vitals:   09/08/20 1900 09/08/20 2000 09/09/20 0448 09/09/20 0810  BP:  116/65 (!) 105/56 (!) 95/44  Pulse: 74 70 67 75  Resp: (!) 23 (!) 24 16 17   Temp:   98.1 F (36.7 C) 97.7 F (36.5 C)  TempSrc:   Oral Oral  SpO2: 100% 100% 98% 100%  Weight:      Height:        Intake/Output Summary (Last  24 hours) at 09/09/2020 1332 Last data filed at 09/08/2020 1935 Gross per 24 hour  Intake 591.96 ml  Output --  Net 591.96 ml   Filed Weights   09/06/20 1249 09/06/20 2200  Weight: (!) 158.8 kg (!) 148.1 kg    Examination: General exam: awake, alert, NAD, cooperative.  Respiratory system: no increased work of breathing.   Cardiovascular system:normal s1, s2 sounds distant.   Gastrointestinal system: Abdomen is obese, nondistended, soft and nontender. No organomegaly or masses felt. Normal bowel sounds heard. Central nervous system: Alert and oriented. No focal neurological deficits. Extremities/skin: chronic lymphedema bilaterally, also with bilateral chronic stasis dermatitis, and ascending erythematous changes. Severe ulceration of both feet see photos     Psychiatry: Judgement and insight appear poor. Mood & affect flat.     Data Reviewed: I have personally reviewed following labs and imaging studies  CBC: Recent Labs  Lab 09/06/20 1401 09/07/20 0622  WBC 25.4* 21.2*  NEUTROABS 22.1*  --   HGB 9.8* 8.8*  HCT 31.6* 27.3*  MCV 91.1 86.9  PLT 402* 355    Basic Metabolic Panel: Recent Labs  Lab 09/06/20 1401 09/07/20 0622 09/07/20 1126  NA 124* 131* 132*  K 3.9 3.4* 3.3*  CL 88* 99 98  CO2 10* 21* 22  GLUCOSE 591* 221* 192*  BUN 52* 52* 51*  CREATININE 1.94* 1.81* 1.79*  CALCIUM 8.8* 7.7* 7.8*    GFR: Estimated Creatinine Clearance: 55.6 mL/min (A) (by C-G formula based on SCr of 1.79 mg/dL (H)).  Liver Function Tests: Recent Labs  Lab 09/06/20 1401  AST 12*  ALT 14  ALKPHOS 120  BILITOT 2.4*  PROT 7.0  ALBUMIN 1.6*    CBG: Recent Labs  Lab 09/08/20 1640 09/08/20 2324 09/09/20 0452 09/09/20 0823 09/09/20 1127  GLUCAP 302* 296* 228* 263* 250*    Recent Results (from the past 240 hour(s))  Resp Panel by RT-PCR (Flu A&B, Covid) Nasopharyngeal Swab     Status: None   Collection Time: 09/06/20  2:01 PM   Specimen: Nasopharyngeal Swab; Nasopharyngeal(NP) swabs in vial transport medium  Result Value Ref Range Status   SARS Coronavirus 2 by RT PCR NEGATIVE NEGATIVE Final    Comment: (NOTE) SARS-CoV-2 target nucleic acids are NOT DETECTED.  The SARS-CoV-2 RNA is generally detectable in upper respiratory specimens during the acute phase of infection. The  lowest concentration of SARS-CoV-2 viral copies this assay can detect is 138 copies/mL. A negative result does not preclude SARS-Cov-2 infection and should not be used as the sole basis for treatment or other patient management decisions. A negative result may occur with  improper specimen collection/handling, submission of specimen other than nasopharyngeal swab, presence of viral mutation(s) within the areas targeted by this assay, and inadequate number of viral copies(<138 copies/mL). A negative result must be combined with clinical observations, patient history, and epidemiological information. The expected result is Negative.  Fact Sheet for Patients:  EntrepreneurPulse.com.au  Fact Sheet for Healthcare Providers:  IncredibleEmployment.be  This test is no t yet approved or cleared by the [place] FDA and  has been authorized for detection and/or diagnosis of SARS-CoV-2 by FDA under an Emergency Use Authorization (EUA). This EUA will remain  in effect (meaning this test can be used) for the duration of the COVID-19 declaration under Section 564(b)(1) of the Act, 21 U.S.C.section 360bbb-3(b)(1), unless the authorization is terminated  or revoked sooner.       Influenza A by PCR NEGATIVE NEGATIVE Final  Influenza B by PCR NEGATIVE NEGATIVE Final    Comment: (NOTE) The Xpert Xpress SARS-CoV-2/FLU/RSV plus assay is intended as an aid in the diagnosis of influenza from Nasopharyngeal swab specimens and should not be used as a sole basis for treatment. Nasal washings and aspirates are unacceptable for Xpert Xpress SARS-CoV-2/FLU/RSV testing.  Fact Sheet for Patients: EntrepreneurPulse.com.au  Fact Sheet for Healthcare Providers: IncredibleEmployment.be  This test is not yet approved or cleared by the [place] FDA and has been authorized for detection and/or diagnosis of SARS-CoV-2 by FDA under  an Emergency Use Authorization (EUA). This EUA will remain in effect (meaning this test can be used) for the duration of the COVID-19 declaration under Section 564(b)(1) of the Act, 21 U.S.C. section 360bbb-3(b)(1), unless the authorization is terminated or revoked.  Performed at [facility], [address] [place], [place] [address]   Urine culture     Status: Abnormal (Preliminary result)   Collection Time: 09/06/20  2:01 PM   Specimen: In/Out Cath Urine  Result Value Ref Range Status   Specimen Description   Final    IN/OUT CATH URINE Performed at [facility], [address], [place], [place] [address]    Special Requests   Final    NONE Performed at [facility], [address], [place], [place] [address]    Culture (A)  Final    1,000 COLONIES/mL PROTEUS MIRABILIS 3,000 COLONIES/mL GRAM NEGATIVE RODS SUSCEPTIBILITIES TO FOLLOW Performed at [facility], [place] [address], [place], [place] [address]    Report Status PENDING  Incomplete  Blood Culture (routine x 2)     Status: Abnormal   Collection Time: 09/06/20  2:14 PM   Specimen: BLOOD  Result Value Ref Range Status   Specimen Description   Final    BLOOD LEFT ANTECUBITAL Performed at [facility], [address], [place], [place] [address]    Special Requests   Final    BOTTLES DRAWN AEROBIC AND ANAEROBIC Blood Culture adequate volume Performed at [facility], [address], [place], [place] [address]    Culture  Setup Time   Final    GRAM POSITIVE COCCI IN BOTH AEROBIC AND ANAEROBIC BOTTLES Gram Stain Report Called to,Read Back By and Verified With: LARIMORE,A@0725  BY MATTHEWS, B 3.3.2022 Performed at [place] ID to follow CRITICAL RESULT CALLED TO, READ BACK BY AND VERIFIED WITHMartin Majestic RN [id number] 09/07/20 A BROWNING Performed at [facility], [place] [address], [place], [place] [address]    Culture STAPHYLOCOCCUS AUREUS (A)  Final   Report Status 09/09/2020 FINAL  Final   Organism  ID, Bacteria STAPHYLOCOCCUS AUREUS  Final      Susceptibility   Staphylococcus aureus - MIC*    CIPROFLOXACIN <=0.5 SENSITIVE Sensitive     ERYTHROMYCIN <=0.25 SENSITIVE Sensitive     GENTAMICIN <=0.5 SENSITIVE Sensitive     OXACILLIN <=0.25 SENSITIVE Sensitive     TETRACYCLINE <=1 SENSITIVE Sensitive     VANCOMYCIN <=0.5 SENSITIVE Sensitive     TRIMETH/SULFA <=10 SENSITIVE Sensitive     CLINDAMYCIN <=0.25 SENSITIVE Sensitive     RIFAMPIN <=0.5 SENSITIVE Sensitive     Inducible Clindamycin NEGATIVE Sensitive     * STAPHYLOCOCCUS AUREUS  Blood Culture ID Panel (Reflexed)     Status: Abnormal   Collection Time: 09/06/20  2:14 PM  Result Value Ref Range Status   Enterococcus faecalis NOT DETECTED NOT DETECTED Final   Enterococcus Faecium NOT DETECTED NOT DETECTED Final  Listeria monocytogenes NOT DETECTED NOT DETECTED Final   Staphylococcus species DETECTED (A) NOT DETECTED Final    Comment: CRITICAL RESULT CALLED TO, READ BACK BY AND VERIFIED WITH: Martin Majestic RN [id number] 09/07/20 A BROWNING    Staphylococcus aureus (BCID) DETECTED (A) NOT DETECTED Final    Comment: CRITICAL RESULT CALLED TO, READ BACK BY AND VERIFIED WITH: Martin Majestic RN [id number] 09/07/20 A BROWNING    Staphylococcus epidermidis NOT DETECTED NOT DETECTED Final   Staphylococcus lugdunensis NOT DETECTED NOT DETECTED Final   Streptococcus species NOT DETECTED NOT DETECTED Final   Streptococcus agalactiae NOT DETECTED NOT DETECTED Final   Streptococcus pneumoniae NOT DETECTED NOT DETECTED Final   Streptococcus pyogenes NOT DETECTED NOT DETECTED Final   A.calcoaceticus-baumannii NOT DETECTED NOT DETECTED Final   Bacteroides fragilis NOT DETECTED NOT DETECTED Final   Enterobacterales NOT DETECTED NOT DETECTED Final   Enterobacter cloacae complex NOT DETECTED NOT DETECTED Final   Escherichia coli NOT DETECTED NOT DETECTED Final   Klebsiella aerogenes NOT DETECTED NOT DETECTED Final   Klebsiella oxytoca NOT DETECTED NOT DETECTED Final    Klebsiella pneumoniae NOT DETECTED NOT DETECTED Final   Proteus species NOT DETECTED NOT DETECTED Final   Salmonella species NOT DETECTED NOT DETECTED Final   Serratia marcescens NOT DETECTED NOT DETECTED Final   Haemophilus influenzae NOT DETECTED NOT DETECTED Final   Neisseria meningitidis NOT DETECTED NOT DETECTED Final   Pseudomonas aeruginosa NOT DETECTED NOT DETECTED Final   Stenotrophomonas maltophilia NOT DETECTED NOT DETECTED Final   Candida albicans NOT DETECTED NOT DETECTED Final   Candida auris NOT DETECTED NOT DETECTED Final   Candida glabrata NOT DETECTED NOT DETECTED Final   Candida krusei NOT DETECTED NOT DETECTED Final   Candida parapsilosis NOT DETECTED NOT DETECTED Final   Candida tropicalis NOT DETECTED NOT DETECTED Final   Cryptococcus neoformans/gattii NOT DETECTED NOT DETECTED Final   Meth resistant mecA/C and MREJ NOT DETECTED NOT DETECTED Final    Comment: Performed at [facility], [address] [place], [place] [address]  Blood Culture (routine x 2)     Status: None (Preliminary result)   Collection Time: 09/06/20  3:59 PM   Specimen: BLOOD RIGHT HAND  Result Value Ref Range Status   Specimen Description BLOOD RIGHT HAND  Final   Special Requests   Final    BOTTLES DRAWN AEROBIC AND ANAEROBIC Blood Culture adequate volume   Culture   Final    NO GROWTH 3 DAYS Performed at [facility], [address], [place], [place] [address]    Report Status PENDING  Incomplete  MRSA PCR Screening     Status: None   Collection Time: 09/06/20  9:46 PM   Specimen: Nasal Mucosa; Nasopharyngeal  Result Value Ref Range Status   MRSA by PCR NEGATIVE NEGATIVE Final    Comment:        The GeneXpert MRSA Assay (FDA approved for NASAL specimens only), is one component of a comprehensive MRSA colonization surveillance program. It is not intended to diagnose MRSA infection nor to guide or monitor treatment for MRSA infections. Performed at [facility],  [address] [place], [place] [address]   Aerobic/Anaerobic Culture w Gram Stain (surgical/deep wound)     Status: None (Preliminary result)   Collection Time: 09/08/20 11:04 AM   Specimen: PATH Other; Tissue  Result Value Ref Range Status   Specimen Description   Final    FOOT RIGHT Performed at [facility], [address], [place],  [place] [address]    Special Requests   Final    NONE Performed at [facility], [address], [place], [place] [address]    Gram Stain   Final    RARE WBC PRESENT, PREDOMINANTLY PMN MODERATE GRAM POSITIVE COCCI IN PAIRS IN CLUSTERS    Culture   Final    ABUNDANT STAPHYLOCOCCUS AUREUS CULTURE REINCUBATED FOR BETTER GROWTH Performed at [facility], [address], [place], [place] [address]    Report Status PENDING  Incomplete  Aerobic/Anaerobic Culture w Gram Stain (surgical/deep wound)     Status: None (Preliminary result)   Collection Time: 09/08/20 11:45 AM   Specimen: PATH Other; Tissue  Result Value Ref Range Status   Specimen Description   Final    FOOT RIGHT Performed at [facility], [address], [place], [place] [address]    Special Requests   Final    NONE Performed at [facility], [address], [place], [place] [address]    Gram Stain PENDING  Incomplete   Culture   Final    CULTURE REINCUBATED FOR BETTER GROWTH Performed at [facility], [place] [address], [place], [place] [address]    Report Status PENDING  Incomplete  Aerobic/Anaerobic Culture w Gram Stain (surgical/deep wound)     Status: None (Preliminary result)   Collection Time: 09/08/20 11:46 AM   Specimen: PATH Other; Tissue  Result Value Ref Range Status   Specimen Description   Final    FOOT LEFT Performed at [facility], [address] [place], [place] [address]    Special Requests   Final    NONE Performed at [facility], [address] [place], [place] [address]    Gram Stain   Final    RARE WBC PRESENT, PREDOMINANTLY PMN ABUNDANT GRAM POSITIVE  COCCI IN CLUSTERS    Culture   Final    CULTURE REINCUBATED FOR BETTER GROWTH Performed at [facility], [address] [place], [place] [address]    Report Status PENDING  Incomplete     Radiology Studies: CT ANGIO AO+BIFEM W & OR WO CONTRAST  Result Date: 09/07/2020 CLINICAL DATA:  Bilateral lower extremity edema and foot ulcers EXAM: CT ANGIOGRAPHY OF ABDOMINAL AORTA WITH ILIOFEMORAL RUNOFF TECHNIQUE: Multidetector CT imaging of the abdomen, pelvis and lower extremities was performed using the standard protocol during bolus administration of intravenous contrast. Multiplanar CT image reconstructions and MIPs were obtained to evaluate the vascular anatomy. CONTRAST:  146mL OMNIPAQUE IOHEXOL 350 MG/ML SOLN COMPARISON:  None. FINDINGS: VASCULAR Aorta: Normal caliber aorta without aneurysm, dissection, vasculitis or significant stenosis. Minimal trace atherosclerotic calcifications. Celiac: Patent without evidence of aneurysm, dissection, vasculitis or significant stenosis. SMA: Patent without evidence of aneurysm, dissection, vasculitis or significant stenosis. Renals: Both renal arteries are patent without evidence of aneurysm, dissection, vasculitis, fibromuscular dysplasia or significant stenosis. 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 IMA: Patent without evidence of aneurysm, dissection, vasculitis or significant stenosis. RIGHT Lower Extremity Inflow: Common, internal and external iliac arteries are patent without evidence of aneurysm, dissection, vasculitis or significant stenosis. Outflow: Common, superficial and profunda femoral arteries and the popliteal artery are patent without evidence of aneurysm, dissection, vasculitis or significant stenosis. Runoff: Patent three vessel runoff to the ankle. LEFT Lower Extremity Inflow: Common, internal and external iliac arteries are patent without evidence of aneurysm, dissection, vasculitis or significant stenosis. Outflow: Common,  superficial and profunda femoral arteries and the popliteal artery are patent without evidence of aneurysm,  dissection, vasculitis or significant stenosis. Runoff: Patent three vessel runoff to the ankle. Veins: Markedly enlarged bilateral superficial great saphenous veins with multiple collateral branches in the calves. Review of the MIP images confirms the above findings. NON-VASCULAR Lower chest: No acute abnormality. Hepatobiliary: Low attenuation of the hepatic parenchyma with subtle sparing around the gallbladder fossa consistent with steatosis. Pancreas: Unremarkable. No pancreatic ductal dilatation or surrounding inflammatory changes. Spleen: Normal in size without focal abnormality. Adrenals/Urinary Tract: Adrenal glands are unremarkable. Kidneys are normal, without renal calculi, focal lesion, or hydronephrosis. Bladder is unremarkable. Stomach/Bowel: Stomach is within normal limits. Appendix appears normal. No evidence of bowel wall thickening, distention, or inflammatory changes. Lymphatic: No suspicious lymphadenopathy. Mildly enlarged bilateral superficial inguinal lymph nodes likely reactive in nature. Reproductive: Status post hysterectomy. No adnexal masses. Other: No abdominal wall hernia or abnormality. No abdominopelvic ascites. Musculoskeletal: Extensive bilateral lower extremity edema beginning just below the knee on the left, and in the mid calf on the right. A fluid collection is present within internal dystrophic calcification in the medial aspect of the left popliteal fossa consistent with a Baker's cyst. Subcutaneous emphysema present along the plantar aspect of the left foot consistent with reported ulcerations. IMPRESSION: VASCULAR 1. No evidence of hemodynamically significant arterial stenosis or occlusion. 2. Enlarged great saphenous veins bilaterally with multiple collaterals. Findings are highly suggestive of superficial venous insufficiency. This may be a contributing factor to the  patient's lower extremity swelling and ulcerations. 3. Mild atherosclerotic plaque along the abdominal aorta. Aortic Atherosclerosis (ICD10-170.0) NON-VASCULAR 1. Hepatic steatosis. 2. Left-sided Baker's cyst. 3. Left greater than right lower extremity edema. 4. Subcutaneous emphysema along the plantar surface of the left foot consistent with the clinical history of soft tissue ulcerations. Signed, Criselda Peaches, MD, [facility] Electronically Signed   By: Jacqulynn Cadet M.D.   On: 09/07/2020 16:18    Scheduled Meds: . atorvastatin  40 mg Oral QHS  . carvedilol  12.5 mg Oral BID WC  . Chlorhexidine Gluconate Cloth  6 each Topical Daily  . cholecalciferol  5,000 Units Oral Daily  . enoxaparin (LOVENOX) injection  70 mg Subcutaneous Q24H  . FLUoxetine  40 mg Oral Daily  . insulin aspart  0-20 Units Subcutaneous TID WC  . insulin aspart  7 Units Subcutaneous TID WC  . insulin glargine  25 Units Subcutaneous BID  . levothyroxine  150 mcg Oral QAC breakfast  . magnesium oxide  200 mg Oral Daily  . pantoprazole  40 mg Oral Daily  . senna  1 tablet Oral BID  . sodium hypochlorite   Irrigation BID   Continuous Infusions: . sodium chloride Stopped (09/07/20 0710)  . sodium chloride 75 mL/hr at 09/09/20 0657  .  ceFAZolin (ANCEF) IV 2 g (09/09/20 0740)     LOS: 3 days   Time spent: 35 minutes  Akacia Boltz Wynetta Emery, MD How to contact the [facility] Attending or Consulting provider [place] or covering provider during after hours [place], for this patient?  1. Check the care team in [facility] and look for a) attending/consulting [facility] provider listed and b) the [facility] team listed 2. Log into [email or web address] and use [place]'s universal password to access. If you do not have the password, please contact the hospital operator. 3. Locate the [facility] provider you are looking for under [facility] and page to a number that you can be directly  reached. 4. If you still have difficulty reaching the  provider, please page the [facility] (Director on Call) for the Hospitalists listed on amion for assistance.   09/09/2020, 1:32 PM

## 2020-09-09 NOTE — H&P (View-Only) (Signed)
ORTHOPAEDIC CONSULTATION  REQUESTING PHYSICIAN: Murlean Iba, MD  Chief Complaint: Ulceration bilateral feet.  HPI: Michelle Skinner is a 55 y.o. female who presents with uncontrolled type 2 diabetes venous and lymphatic insufficiency with massive swelling of both legs with massive necrotic ulcers involving the entire plantar aspect of both feet.  Patient underwent initial surgical debridement of the plantar aspect of both feet and is referred to [facility] for further treatment.  Past Medical History:  Diagnosis Date  . Anemia   . Cervical cancer ([place])   . Depression   . Diabetes mellitus, type II ([place])   . GERD (gastroesophageal reflux disease)   . Hyperlipidemia   . Neuropathy   . PTSD (post-traumatic stress disorder)    Past Surgical History:  Procedure Laterality Date  . ABDOMINAL HYSTERECTOMY    . COLONOSCOPY WITH PROPOFOL N/A 05/25/2018   Procedure: COLONOSCOPY WITH PROPOFOL;  Surgeon: Daneil Dolin, MD;  Location[facility];  Service: Endoscopy;  Laterality: N/A;  7:30am  . OTHER SURGICAL HISTORY Right    R Foot- I&D  . POLYPECTOMY  05/25/2018   Procedure: POLYPECTOMY;  Surgeon: Daneil Dolin, MD;  Location: [facility];  Service: Endoscopy;;  colon  . UMBILICAL HERNIA REPAIR  2007   Social History   Socioeconomic History  . Marital status: Married    Spouse name: Not on file  . Number of children: Not on file  . Years of education: Not on file  . Highest education level: Not on file  Occupational History  . Not on file  Tobacco Use  . Smoking status: Never Smoker  . Smokeless tobacco: Never Used  Vaping Use  . Vaping Use: Never used  Substance and Sexual Activity  . Alcohol use: No    Alcohol/week: 0.0 standard drinks  . Drug use: No  . Sexual activity: Yes    Birth control/protection: Surgical  Other Topics Concern  . Not on file  Social History Narrative  . Not on file   Social Determinants of Health   Financial Resource  Strain: Not on file  Food Insecurity: Not on file  Transportation Needs: Not on file  Physical Activity: Not on file  Stress: Not on file  Social Connections: Not on file   Family History  Problem Relation Age of Onset  . Hypertension Mother   . Diabetes Father   . Hyperlipidemia Father   . CAD Father   . Stroke Father   . Osteoporosis Maternal Grandmother   . Cancer Maternal Grandmother   . Hypertension Maternal Grandmother   . Colon cancer Neg Hx    - negative except otherwise stated in the family history section Allergies  Allergen Reactions  . Sulfa Antibiotics Rash  . Bactrim [Sulfamethoxazole-Trimethoprim] Itching  . Cherry Other (See Comments)    Unknown  . Gabapentin Other (See Comments)    Unknown  . Invokana [Canagliflozin] Other (See Comments)    Unknown  . Other     Hot peppers   Prior to Admission medications   Medication Sig Start Date End Date Taking? Authorizing Provider  acetaminophen (TYLENOL) 500 MG tablet Take 1,000 mg by mouth every 6 (six) hours as needed for mild pain, fever or headache.    [provider]  Ascorbic Acid (VITAMIN C PO) Take 1 tablet by mouth daily.    [provider]  atorvastatin (LIPITOR) 40 MG tablet Take 1 tablet (40 mg total) by mouth at bedtime. 07/12/20  Cassandria Anger, MD  Caffeine-Magnesium Salicylate (DIUREX PO) Take 200 mg by mouth daily.    [provider]  carvedilol (COREG) 25 MG tablet Take 1 tablet (25 mg total) by mouth 2 (two) times daily with a meal. 08/28/20   [name], Marella Chimes, MD  Cholecalciferol (VITAMIN D3) 125 MCG (5000 UT) CAPS Take 1 capsule (5,000 Units total) by mouth daily. 05/13/18   Cassandria Anger, MD  Continuous Blood Gluc Receiver (FREESTYLE LIBRE 14 DAY READER) DEVI by Does not apply route 4 (four) times daily.    [provider]  Continuous Blood Gluc Receiver (FREESTYLE LIBRE 2 READER) DEVI As directed 03/06/20   Cassandria Anger, MD   Continuous Blood Gluc Sensor (FREESTYLE LIBRE 2 SENSOR) MISC 1 Piece by Does not apply route every 14 (fourteen) days. 03/06/20   Cassandria Anger, MD  doxycycline (ADOXA) 100 MG tablet Take 100 mg by mouth 2 (two) times daily. 10 day supply    [provider]  FLUoxetine (PROZAC) 40 MG capsule Take 40 mg by mouth daily.    [provider]  hydrALAZINE (APRESOLINE) 25 MG tablet Take 1 tablet (25 mg total) by mouth 2 (two) times daily. 05/04/20 05/04/21  Katsadouros, Vasilios, MD  insulin regular human CONCENTRATED (HUMULIN R U-500 KWIKPEN) 500 UNIT/ML kwikpen Inject 50-60 Units into the skin 3 (three) times daily with meals. 07/12/20   Cassandria Anger, MD  levothyroxine (SYNTHROID) 150 MCG tablet Take 1 tablet (150 mcg total) by mouth daily before breakfast. 07/12/20   [name], Marella Chimes, MD  Multiple Vitamin (MULTIVITAMIN WITH MINERALS) TABS tablet Take 1 tablet by mouth daily.    [provider]  neomycin-bacitracin-polymyxin (NEOSPORIN) ointment Apply 1 application topically every 12 (twelve) hours. To legs    [provider]  omeprazole (PRILOSEC) 20 MG capsule Take 1 capsule (20 mg total) by mouth 2 (two) times daily before a meal. For 10 days while on Pylera for H.pylori Patient not taking: Reported on 04/27/2020 03/05/18   [name], PA-C  pantoprazole (PROTONIX) 40 MG tablet TAKE ONE (1) [place] Patient taking differently: Take 40 mg by mouth daily.  12/01/18   Cassandria Anger, MD  Thiamine HCl (VITAMIN B-1 PO) Take 1 tablet by mouth daily.    [provider]  VITAMIN A PO Take 1 tablet by mouth daily.    [provider]  Vitamin D, Ergocalciferol, (DRISDOL) 1.25 MG (50000 UNIT) CAPS capsule Take 1 capsule (50,000 Units total) by mouth every 7 (seven) days. Patient not taking: Reported on 04/27/2020 11/15/19   Cassandria Anger, MD  VITAMIN E PO Take 1 tablet by mouth daily.    [provider]    CT ANGIO AO+BIFEM W & OR WO CONTRAST  Result Date: 09/07/2020 CLINICAL DATA:  Bilateral lower extremity edema and foot ulcers EXAM: CT ANGIOGRAPHY OF ABDOMINAL AORTA WITH ILIOFEMORAL RUNOFF TECHNIQUE: Multidetector CT imaging of the abdomen, pelvis and lower extremities was performed using the standard protocol during bolus administration of intravenous contrast. Multiplanar CT image reconstructions and MIPs were obtained to evaluate the vascular anatomy. CONTRAST:  158mL OMNIPAQUE IOHEXOL 350 MG/ML SOLN COMPARISON:  None. FINDINGS: VASCULAR Aorta: Normal caliber aorta without aneurysm, dissection, vasculitis or significant stenosis. Minimal trace atherosclerotic calcifications. Celiac: Patent without evidence of aneurysm, dissection, vasculitis or significant stenosis. SMA: Patent without evidence of aneurysm, dissection, vasculitis or significant stenosis. Renals: Both renal arteries are patent without evidence of aneurysm, dissection, vasculitis, fibromuscular dysplasia  or significant stenosis. 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 IMA: Patent without evidence of aneurysm, dissection, vasculitis or significant stenosis. RIGHT Lower Extremity Inflow: Common, internal and external iliac arteries are patent without evidence of aneurysm, dissection, vasculitis or significant stenosis. Outflow: Common, superficial and profunda femoral arteries and the popliteal artery are patent without evidence of aneurysm, dissection, vasculitis or significant stenosis. Runoff: Patent three vessel runoff to the ankle. LEFT Lower Extremity Inflow: Common, internal and external iliac arteries are patent without evidence of aneurysm, dissection, vasculitis or significant stenosis. Outflow: Common, superficial and profunda femoral arteries and the popliteal artery are patent without evidence of aneurysm, dissection, vasculitis or significant stenosis. Runoff: Patent three vessel runoff to the ankle. Veins: Markedly enlarged  bilateral superficial great saphenous veins with multiple collateral branches in the calves. Review of the MIP images confirms the above findings. NON-VASCULAR Lower chest: No acute abnormality. Hepatobiliary: Low attenuation of the hepatic parenchyma with subtle sparing around the gallbladder fossa consistent with steatosis. Pancreas: Unremarkable. No pancreatic ductal dilatation or surrounding inflammatory changes. Spleen: Normal in size without focal abnormality. Adrenals/Urinary Tract: Adrenal glands are unremarkable. Kidneys are normal, without renal calculi, focal lesion, or hydronephrosis. Bladder is unremarkable. Stomach/Bowel: Stomach is within normal limits. Appendix appears normal. No evidence of bowel wall thickening, distention, or inflammatory changes. Lymphatic: No suspicious lymphadenopathy. Mildly enlarged bilateral superficial inguinal lymph nodes likely reactive in nature. Reproductive: Status post hysterectomy. No adnexal masses. Other: No abdominal wall hernia or abnormality. No abdominopelvic ascites. Musculoskeletal: Extensive bilateral lower extremity edema beginning just below the knee on the left, and in the mid calf on the right. A fluid collection is present within internal dystrophic calcification in the medial aspect of the left popliteal fossa consistent with a Baker's cyst. Subcutaneous emphysema present along the plantar aspect of the left foot consistent with reported ulcerations. IMPRESSION: VASCULAR 1. No evidence of hemodynamically significant arterial stenosis or occlusion. 2. Enlarged great saphenous veins bilaterally with multiple collaterals. Findings are highly suggestive of superficial venous insufficiency. This may be a contributing factor to the patient's lower extremity swelling and ulcerations. 3. Mild atherosclerotic plaque along the abdominal aorta. Aortic Atherosclerosis (ICD10-170.0) NON-VASCULAR 1. Hepatic steatosis. 2. Left-sided Baker's cyst. 3. Left greater than  right lower extremity edema. 4. Subcutaneous emphysema along the plantar surface of the left foot consistent with the clinical history of soft tissue ulcerations. Signed, Criselda Peaches, MD, [facility] Electronically Signed   By: Jacqulynn Cadet M.D.   On: 09/07/2020 16:18   - pertinent xrays, CT, MRI studies were reviewed and independently interpreted  Positive ROS: All other systems have been reviewed and were otherwise negative with the exception of those mentioned in the HPI and as above.  Physical Exam: General: Alert, no acute distress Psychiatric: Patient is competent for consent with normal mood and affect Lymphatic: No axillary or cervical lymphadenopathy Cardiovascular: No pedal edema Respiratory: No cyanosis, no use of accessory musculature GI: No organomegaly, abdomen is soft and non-tender    Images:  @ENCIMAGES @  Labs:  Lab Results  Component Value Date   HGBA1C 10.1 (H) 07/03/2020   HGBA1C 10.0 (A) 03/06/2020   HGBA1C 10.7 (A) 11/15/2019   REPTSTATUS PENDING 09/08/2020   GRAMSTAIN  09/08/2020    RARE WBC PRESENT, PREDOMINANTLY PMN ABUNDANT GRAM POSITIVE COCCI IN CLUSTERS Performed at [facility], [place] [address] [place], [place] [address]    CULT PENDING 09/08/2020    Lab Results  Component Value Date   ALBUMIN 1.6 (L) 09/06/2020   ALBUMIN 2.6 (L) 04/27/2020   ALBUMIN 3.3 (L) 01/07/2017    Neurologic: Patient does not have protective sensation bilateral lower extremities.   MUSCULOSKELETAL:   Skin: Examination patient has massive ulcers on the plantar aspect of both feet this goes down to tendon fascia and muscle the wound bed still has necrotic nonviable tissue which involves the plantar aspect of both feet.    Patient has a palpable dorsalis pedis pulse bilaterally.  She has massive lymphatic insufficiency of both legs with a circumference of her calf greater than 100  cm.  Patient has uncontrolled type 2 diabetes with a hemoglobin A1c consistently greater than 10 and severe protein caloric malnutrition with an albumin of 1.6.  MRI scan shows massive ulceration on the plantar aspect of both feet but no definite osteomyelitis.  Assessment: Assessment: Massive ulceration bilateral plantar aspect of both feet with uncontrolled type 2 diabetes, severe protein caloric malnutrition and massive swelling of both legs with morbid obesity.  Plan: Plan: Discussed with the patient that limb salvage will be extremely difficult.  We will plan for repeat surgical debridement both feet tomorrow, placement of an installation wound VAC, with follow-up surgery on Wednesday.  Patient ultimately may require bilateral below the knee amputations which will also be extremely hard to heal due to the lymphatic insufficiency and swelling of both legs.  Thank you for the consult and the opportunity to see Ms. Clemetine Marker, MD [place] [phone] 9:07 AM

## 2020-09-09 NOTE — Plan of Care (Addendum)
Pt arrived stable on unit. Patient has skin irritation to mid buttock, sacral pad applied. Patient lower extremities very edematous. Wound on left lateral calf, foam dressing. Patient belongings at bedside. Will continue to monitor.    Problem: Education: Goal: Knowledge of General Education information will improve Description: Including pain rating scale, medication(s)/side effects and non-pharmacologic comfort measures Outcome: Progressing   Problem: Activity: Goal: Risk for activity intolerance will decrease Outcome: Progressing   Problem: Pain Managment: Goal: General experience of comfort will improve Outcome: Progressing   Problem: Safety: Goal: Ability to remain free from injury will improve Outcome: Progressing   Problem: Skin Integrity: Goal: Risk for impaired skin integrity will decrease Outcome: Progressing

## 2020-09-09 NOTE — TOC Initial Note (Addendum)
Transition of Care [facility]) - Initial/Assessment Note    Patient Details  Name: Michelle Skinner MRN: [id number] Date of Birth: 12/29/1965  Transition of Care [facility]) CM/SW Contact:    [address], [place] Phone Number: 09/09/2020, 1:41 PM  Clinical Narrative:                 CSW met with pt at bedside to discuss recommendation of SNF placement. Pt states she agrees she needs SNF placement but is scheduled to have three more surgeries before dc (verified with RN). Pt prefers SNF in the [place] area. CSW will hold off on sending offers since pt is not medically ready for dc. Pt states she has received both her vaccines and her booster shot. Pt states it is ok to discuss her care with her spouse, Shanon Brow. Pt inquired about where her jewelry was as the RN advised it was locked up. CSW asked RN where the jewelry was located and RN stated it was at another hospital (waiting on husband to pick it up). RN stated she would update the pt.   Expected Discharge Plan: Skilled Nursing Facility Barriers to Discharge: Continued Medical Work up   Patient Goals and CMS Choice Patient states their goals for this hospitalization and ongoing recovery are:: unsure currently      Expected Discharge Plan and Services Expected Discharge Plan: [place]       Living arrangements for the past 2 months: Apartment                                      Prior Living Arrangements/Services Living arrangements for the past 2 months: Apartment Lives with:: Spouse Patient language and need for interpreter reviewed:: Yes Do you feel safe going back to the place where you live?: Yes      Need for Family Participation in Patient Care: Yes (Comment) Care giver support system in place?: Yes (comment)   Criminal Activity/Legal Involvement Pertinent to Current Situation/Hospitalization: No - Comment as needed  Activities of Daily Living Home Assistive Devices/Equipment: CBG Meter ADL  Screening (condition at time of admission) Patient's cognitive ability adequate to safely complete daily activities?: Yes Is the patient deaf or have difficulty hearing?: No Does the patient have difficulty seeing, even when wearing glasses/contacts?: No Does the patient have difficulty concentrating, remembering, or making decisions?: No Patient able to express need for assistance with ADLs?: Yes Does the patient have difficulty dressing or bathing?: No Independently performs ADLs?: Yes (appropriate for developmental age) Does the patient have difficulty walking or climbing stairs?: Yes Weakness of Legs: Both Weakness of Arms/Hands: None  Permission Sought/Granted Permission sought to share information with : Family Supports    Share Information with NAME: Mr. Hoel, spouse           Emotional Assessment Appearance:: Appears stated age   Affect (typically observed): Appropriate Orientation: : Oriented to Self,Oriented to Place,Oriented to  Time,Oriented to Situation Alcohol / Substance Use: Not Applicable Psych Involvement: No (comment)  Admission diagnosis:  Weakness [R53.1] Sepsis ([place]) [A41.9] Cellulitis of lower extremity, unspecified laterality [L03.119] Sepsis with acute renal failure without septic shock, due to unspecified organism, unspecified acute renal failure type ([place]) [A41.9, R65.20, N17.9] Patient Active Problem List   Diagnosis Date Noted  . Severe protein-calorie malnutrition ([place])   . BMI 50.0-59.9, adult ([place])   . Plantar ulcer of right foot ([place])   .  Plantar ulcer of left foot ([place])   . Lymphedema   . Sepsis ([place]) 09/06/2020  . Cellulitis 04/27/2020  . AKI (acute kidney injury) ([place]) 04/27/2020  . Stasis dermatitis 04/27/2020  . Anxiety 03/31/2020  . Depression 03/31/2020  . GERD (gastroesophageal reflux disease) 03/31/2020  . Posttraumatic stress disorder 03/31/2020  . Personal history of noncompliance with medical treatment, presenting hazards to  health 02/09/2018  . Abnormal CT of the abdomen 10/20/2017  . Dilated cbd, acquired 10/20/2017  . Essential hypertension, benign 01/14/2017  . Primary hypothyroidism 05/03/2015  . Mixed hyperlipidemia 05/03/2015  . Uncontrolled type 2 diabetes mellitus with complication, with long-term current use of insulin ([place]) 04/25/2015  . Morbid (severe) obesity due to excess calories ([place]) 04/25/2015  . Vitamin D deficiency 04/25/2015  . Cervical cancer ([place]) 10/05/2012   PCP:  Monico Blitz, MD Pharmacy:   [facility], [address] [place] [address] Phone: [phone] Fax: [facility], [address], [address] [place], [address] [place] [address] Phone: [phone] Fax: [phone]  [facility] [place], [address] [place] [address] Phone: [phone] Fax: [phone]     Social Determinants of Health (SDOH) Interventions    Readmission Risk Interventions No flowsheet data found.

## 2020-09-09 NOTE — Consult Note (Addendum)
[facility]       Reason for Consult: MSSA bacteremia    Referring Physician: CHAMP autoconsult  Principal Problem:   Plantar ulcer of right foot ([place]) Active Problems:   Uncontrolled type 2 diabetes mellitus with complication, with long-term current use of insulin (HCC)   Morbid (severe) obesity due to excess calories ([place])   Primary hypothyroidism   Mixed hyperlipidemia   Essential hypertension, benign   Depression   GERD (gastroesophageal reflux disease)   Cellulitis   Sepsis ([place])   Plantar ulcer of left foot (HCC)   Lymphedema   Severe protein-calorie malnutrition (HCC)   BMI 50.0-59.9, adult (HCC)    atorvastatin  40 mg Oral QHS   carvedilol  12.5 mg Oral BID WC   Chlorhexidine Gluconate Cloth  6 each Topical Daily   cholecalciferol  5,000 Units Oral Daily   enoxaparin (LOVENOX) injection  70 mg Subcutaneous Q24H   FLUoxetine  40 mg Oral Daily   insulin aspart  0-20 Units Subcutaneous TID WC   insulin aspart  7 Units Subcutaneous TID WC   insulin glargine  25 Units Subcutaneous BID   levothyroxine  150 mcg Oral QAC breakfast   magnesium oxide  200 mg Oral Daily   pantoprazole  40 mg Oral Daily   senna  1 tablet Oral BID   sodium hypochlorite   Irrigation BID    Recommendations: Continue cefazolin Orthopedic evaluation (already done) TTE  Assessment: She has MSSA bacteremia with a likely source of her bilateral legs with cellulitis with necrotic ulcers of both feet of the entire plantar aspect.  Also in the setting of lymphatic insufficiency.    Antibiotics: Cefazolin Total antibiotics day 4  HPI: Michelle Skinner is a 55 y.o. female with morbid obesity and poorly-controlled DM with a Hgb A1c of 10.1 who presented with bilateral foot pain.  Photos reviewed of her feet from today and 3/2 and significant necrosis and was debrided by Dr. Constance Haw at [facility].  Sent to [facility] for further orthopedic intervention and seen by Dr.  Sharol Given who has recommended bilateral amputation, which the patient has refused.  She also has poor protein caloric malnutrition with an albumin of 1.6.   Blood cultures as above now with MSSA in 1 set of blood cultures.     Review of Systems:  Constitutional: negative for fevers and chills Gastrointestinal: negative for diarrhea All other systems reviewed and are negative    Past Medical History:  Diagnosis Date   Anemia    Cervical cancer (HCC)    Depression    Diabetes mellitus, type II (HCC)    GERD (gastroesophageal reflux disease)    Hyperlipidemia    Neuropathy    PTSD (post-traumatic stress disorder)     Social History   Tobacco Use   Smoking status: Never Smoker   Smokeless tobacco: Never Used  Vaping Use   Vaping Use: Never used  Substance Use Topics   Alcohol use: No    Alcohol/week: 0.0 standard drinks   Drug use: No    Family History  Problem Relation Age of Onset   Hypertension Mother    Diabetes Father    Hyperlipidemia Father    CAD Father    Stroke Father    Osteoporosis Maternal Grandmother    Cancer Maternal Grandmother    Hypertension Maternal Grandmother    Colon cancer Neg Hx     Allergies  Allergen Reactions   Sulfa Antibiotics Rash  Bactrim [Sulfamethoxazole-Trimethoprim] Itching   Cherry Other (See Comments)    Unknown   Gabapentin Other (See Comments)    Unknown   Invokana [Canagliflozin] Other (See Comments)    Unknown   Other     Hot peppers    Physical Exam: Constitutional: in no apparent distress  Vitals:   09/09/20 0448 09/09/20 0810  BP: (!) 105/56 (!) 95/44  Pulse: 67 75  Resp: 16 17  Temp: 98.1 F (36.7 C) 97.7 F (36.5 C)  SpO2: 98% 100%   EYES: anicteric Cardiovascular: Cor RRR Respiratory: clear; GI: morbidly obese Musculoskeletal: feet open, unwrapped; tendons and tissue noted.  No active drainage noted.   Lab Results  Component Value Date   WBC 21.2 (H) 09/07/2020   HGB  8.8 (L) 09/07/2020   HCT 27.3 (L) 09/07/2020   MCV 86.9 09/07/2020   PLT 353 09/07/2020    Lab Results  Component Value Date   CREATININE 1.79 (H) 09/07/2020   BUN 51 (H) 09/07/2020   NA 132 (L) 09/07/2020   K 3.3 (L) 09/07/2020   CL 98 09/07/2020   CO2 22 09/07/2020    Lab Results  Component Value Date   ALT 14 09/06/2020   AST 12 (L) 09/06/2020   ALKPHOS 120 09/06/2020     Microbiology: Recent Results (from the past 240 hour(s))  Resp Panel by RT-PCR (Flu A&B, Covid) Nasopharyngeal Swab     Status: None   Collection Time: 09/06/20  2:01 PM   Specimen: Nasopharyngeal Swab; Nasopharyngeal(NP) swabs in vial transport medium  Result Value Ref Range Status   SARS Coronavirus 2 by RT PCR NEGATIVE NEGATIVE Final    Comment: (NOTE) SARS-CoV-2 target nucleic acids are NOT DETECTED.  The SARS-CoV-2 RNA is generally detectable in upper respiratory specimens during the acute phase of infection. The lowest concentration of SARS-CoV-2 viral copies this assay can detect is 138 copies/mL. A negative result does not preclude SARS-Cov-2 infection and should not be used as the sole basis for treatment or other patient management decisions. A negative result may occur with  improper specimen collection/handling, submission of specimen other than nasopharyngeal swab, presence of viral mutation(s) within the areas targeted by this assay, and inadequate number of viral copies(<138 copies/mL). A negative result must be combined with clinical observations, patient history, and epidemiological information. The expected result is Negative.  Fact Sheet for Patients:  EntrepreneurPulse.com.au  Fact Sheet for Healthcare Providers:  IncredibleEmployment.be  This test is no t yet approved or cleared by the [place] FDA and  has been authorized for detection and/or diagnosis of SARS-CoV-2 by FDA under an Emergency Use Authorization (EUA). This EUA will  remain  in effect (meaning this test can be used) for the duration of the COVID-19 declaration under Section 564(b)(1) of the Act, 21 U.S.C.section 360bbb-3(b)(1), unless the authorization is terminated  or revoked sooner.       Influenza A by PCR NEGATIVE NEGATIVE Final   Influenza B by PCR NEGATIVE NEGATIVE Final    Comment: (NOTE) The Xpert Xpress SARS-CoV-2/FLU/RSV plus assay is intended as an aid in the diagnosis of influenza from Nasopharyngeal swab specimens and should not be used as a sole basis for treatment. Nasal washings and aspirates are unacceptable for Xpert Xpress SARS-CoV-2/FLU/RSV testing.  Fact Sheet for Patients: EntrepreneurPulse.com.au  Fact Sheet for Healthcare Providers: IncredibleEmployment.be  This test is not yet approved or cleared by the [place] FDA and has been authorized for detection and/or diagnosis of SARS-CoV-2 by FDA  under an Emergency Use Authorization (EUA). This EUA will remain in effect (meaning this test can be used) for the duration of the COVID-19 declaration under Section 564(b)(1) of the Act, 21 U.S.C. section 360bbb-3(b)(1), unless the authorization is terminated or revoked.  Performed at [facility], [address], [place], [place] [address]   Urine culture     Status: Abnormal (Preliminary result)   Collection Time: 09/06/20  2:01 PM   Specimen: In/Out Cath Urine  Result Value Ref Range Status   Specimen Description   Final    IN/OUT CATH URINE Performed at [facility], [address] [place], [place] [address]    Special Requests   Final    NONE Performed at [facility], [address], [place], [place] [address]    Culture (A)  Final    1,000 COLONIES/mL PROTEUS MIRABILIS 3,000 COLONIES/mL GRAM NEGATIVE RODS SUSCEPTIBILITIES TO FOLLOW Performed at [facility], [place] [address] [place], [place] [address]    Report Status PENDING  Incomplete  Blood Culture (routine x 2)      Status: Abnormal   Collection Time: 09/06/20  2:14 PM   Specimen: BLOOD  Result Value Ref Range Status   Specimen Description   Final    BLOOD LEFT ANTECUBITAL Performed at [facility], [address] [place], [place] [address]    Special Requests   Final    BOTTLES DRAWN AEROBIC AND ANAEROBIC Blood Culture adequate volume Performed at [facility], [address], [place], [place] [address]    Culture  Setup Time   Final    GRAM POSITIVE COCCI IN BOTH AEROBIC AND ANAEROBIC BOTTLES Gram Stain Report Called to,Read Back By and Verified With: LARIMORE,[email or web address]  BY MATTHEWS, B 3.3.2022 Performed at [facility] ID to follow CRITICAL RESULT CALLED TO, READ BACK BY AND VERIFIED WITHMartin Majestic RN [id number] 09/07/20 A BROWNING Performed at [facility], [place] [address], [place], [place] [address]    Culture STAPHYLOCOCCUS AUREUS (A)  Final   Report Status 09/09/2020 FINAL  Final   Organism ID, Bacteria STAPHYLOCOCCUS AUREUS  Final      Susceptibility   Staphylococcus aureus - MIC*    CIPROFLOXACIN <=0.5 SENSITIVE Sensitive     ERYTHROMYCIN <=0.25 SENSITIVE Sensitive     GENTAMICIN <=0.5 SENSITIVE Sensitive     OXACILLIN <=0.25 SENSITIVE Sensitive     TETRACYCLINE <=1 SENSITIVE Sensitive     VANCOMYCIN <=0.5 SENSITIVE Sensitive     TRIMETH/SULFA <=10 SENSITIVE Sensitive     CLINDAMYCIN <=0.25 SENSITIVE Sensitive     RIFAMPIN <=0.5 SENSITIVE Sensitive     Inducible Clindamycin NEGATIVE Sensitive     * STAPHYLOCOCCUS AUREUS  Blood Culture ID Panel (Reflexed)     Status: Abnormal   Collection Time: 09/06/20  2:14 PM  Result Value Ref Range Status   Enterococcus faecalis NOT DETECTED NOT DETECTED Final   Enterococcus Faecium NOT DETECTED NOT DETECTED Final   Listeria monocytogenes NOT DETECTED NOT DETECTED Final   Staphylococcus species DETECTED (A) NOT DETECTED Final    Comment: CRITICAL RESULT CALLED TO, READ BACK BY AND VERIFIED WITHMartin Majestic RN [id number] 09/07/20 A  BROWNING    Staphylococcus aureus (BCID) DETECTED (A) NOT DETECTED Final    Comment: CRITICAL RESULT CALLED TO, READ BACK BY AND VERIFIED WITHMartin Majestic RN [id number] 09/07/20 A BROWNING    Staphylococcus epidermidis NOT DETECTED NOT DETECTED Final   Staphylococcus lugdunensis NOT DETECTED NOT DETECTED Final   Streptococcus  species NOT DETECTED NOT DETECTED Final   Streptococcus agalactiae NOT DETECTED NOT DETECTED Final   Streptococcus pneumoniae NOT DETECTED NOT DETECTED Final   Streptococcus pyogenes NOT DETECTED NOT DETECTED Final   A.calcoaceticus-baumannii NOT DETECTED NOT DETECTED Final   Bacteroides fragilis NOT DETECTED NOT DETECTED Final   Enterobacterales NOT DETECTED NOT DETECTED Final   Enterobacter cloacae complex NOT DETECTED NOT DETECTED Final   Escherichia coli NOT DETECTED NOT DETECTED Final   Klebsiella aerogenes NOT DETECTED NOT DETECTED Final   Klebsiella oxytoca NOT DETECTED NOT DETECTED Final   Klebsiella pneumoniae NOT DETECTED NOT DETECTED Final   Proteus species NOT DETECTED NOT DETECTED Final   Salmonella species NOT DETECTED NOT DETECTED Final   Serratia marcescens NOT DETECTED NOT DETECTED Final   Haemophilus influenzae NOT DETECTED NOT DETECTED Final   Neisseria meningitidis NOT DETECTED NOT DETECTED Final   Pseudomonas aeruginosa NOT DETECTED NOT DETECTED Final   Stenotrophomonas maltophilia NOT DETECTED NOT DETECTED Final   Candida albicans NOT DETECTED NOT DETECTED Final   Candida auris NOT DETECTED NOT DETECTED Final   Candida glabrata NOT DETECTED NOT DETECTED Final   Candida krusei NOT DETECTED NOT DETECTED Final   Candida parapsilosis NOT DETECTED NOT DETECTED Final   Candida tropicalis NOT DETECTED NOT DETECTED Final   Cryptococcus neoformans/gattii NOT DETECTED NOT DETECTED Final   Meth resistant mecA/C and MREJ NOT DETECTED NOT DETECTED Final    Comment: Performed at [facility], [address] [place], [place] [address]  Blood Culture  (routine x 2)     Status: None (Preliminary result)   Collection Time: 09/06/20  3:59 PM   Specimen: BLOOD RIGHT HAND  Result Value Ref Range Status   Specimen Description BLOOD RIGHT HAND  Final   Special Requests   Final    BOTTLES DRAWN AEROBIC AND ANAEROBIC Blood Culture adequate volume   Culture   Final    NO GROWTH 3 DAYS Performed at [facility], [address] [place], [place] [address]    Report Status PENDING  Incomplete  MRSA PCR Screening     Status: None   Collection Time: 09/06/20  9:46 PM   Specimen: Nasal Mucosa; Nasopharyngeal  Result Value Ref Range Status   MRSA by PCR NEGATIVE NEGATIVE Final    Comment:        The GeneXpert MRSA Assay (FDA approved for NASAL specimens only), is one component of a comprehensive MRSA colonization surveillance program. It is not intended to diagnose MRSA infection nor to guide or monitor treatment for MRSA infections. Performed at [facility], [address], [place], [place] [address]   Aerobic/Anaerobic Culture w Gram Stain (surgical/deep wound)     Status: None (Preliminary result)   Collection Time: 09/08/20 11:04 AM   Specimen: PATH Other; Tissue  Result Value Ref Range Status   Specimen Description   Final    FOOT RIGHT Performed at [facility], [address] [place], [place] [address]    Special Requests   Final    NONE Performed at [facility], [address] [place],  [address]    Gram Stain   Final    RARE WBC PRESENT, PREDOMINANTLY PMN MODERATE GRAM POSITIVE COCCI IN PAIRS IN CLUSTERS Performed at [facility], [place] [address], [place],  [address]    Culture PENDING  Incomplete   Report Status PENDING  Incomplete  Aerobic/Anaerobic Culture w Gram Stain (surgical/deep wound)     Status: None (Preliminary result)  Collection Time: 09/08/20 11:46 AM   Specimen: PATH Other; Tissue  Result Value Ref Range Status   Specimen Description   Final    FOOT LEFT Performed at [facility], [address] [place], [place] [address]    Special Requests   Final    NONE Performed at [facility], [address], [place], [place] [address]    Gram Stain   Final    RARE WBC PRESENT, PREDOMINANTLY PMN ABUNDANT GRAM POSITIVE COCCI IN CLUSTERS Performed at [facility], [place] [address] [place], [place] [address]    Culture PENDING  Incomplete   Report Status PENDING  Incomplete    Thayer Headings, [facility] [email or web address] 09/09/2020, 11:09 AM

## 2020-09-09 NOTE — Plan of Care (Signed)

## 2020-09-09 NOTE — Consult Note (Signed)
ORTHOPAEDIC CONSULTATION  REQUESTING PHYSICIAN: Murlean Iba, MD  Chief Complaint: Ulceration bilateral feet.  HPI: Michelle Skinner is a 55 y.o. female who presents with uncontrolled type 2 diabetes venous and lymphatic insufficiency with massive swelling of both legs with massive necrotic ulcers involving the entire plantar aspect of both feet.  Patient underwent initial surgical debridement of the plantar aspect of both feet and is referred to [facility] for further treatment.  Past Medical History:  Diagnosis Date  . Anemia   . Cervical cancer ([place])   . Depression   . Diabetes mellitus, type II ([place])   . GERD (gastroesophageal reflux disease)   . Hyperlipidemia   . Neuropathy   . PTSD (post-traumatic stress disorder)    Past Surgical History:  Procedure Laterality Date  . ABDOMINAL HYSTERECTOMY    . COLONOSCOPY WITH PROPOFOL N/A 05/25/2018   Procedure: COLONOSCOPY WITH PROPOFOL;  Surgeon: Daneil Dolin, MD;  Location: [facility];  Service: Endoscopy;  Laterality: N/A;  7:30am  . OTHER SURGICAL HISTORY Right    R Foot- I&D  . POLYPECTOMY  05/25/2018   Procedure: POLYPECTOMY;  Surgeon: Daneil Dolin, MD;  Location: [facility];  Service: Endoscopy;;  colon  . UMBILICAL HERNIA REPAIR  2007   Social History   Socioeconomic History  . Marital status: Married    Spouse name: Not on file  . Number of children: Not on file  . Years of education: Not on file  . Highest education level: Not on file  Occupational History  . Not on file  Tobacco Use  . Smoking status: Never Smoker  . Smokeless tobacco: Never Used  Vaping Use  . Vaping Use: Never used  Substance and Sexual Activity  . Alcohol use: No    Alcohol/week: 0.0 standard drinks  . Drug use: No  . Sexual activity: Yes    Birth control/protection: Surgical  Other Topics Concern  . Not on file  Social History Narrative  . Not on file   Social Determinants of Health   Financial Resource  Strain: Not on file  Food Insecurity: Not on file  Transportation Needs: Not on file  Physical Activity: Not on file  Stress: Not on file  Social Connections: Not on file   Family History  Problem Relation Age of Onset  . Hypertension Mother   . Diabetes Father   . Hyperlipidemia Father   . CAD Father   . Stroke Father   . Osteoporosis Maternal Grandmother   . Cancer Maternal Grandmother   . Hypertension Maternal Grandmother   . Colon cancer Neg Hx    - negative except otherwise stated in the family history section Allergies  Allergen Reactions  . Sulfa Antibiotics Rash  . Bactrim [Sulfamethoxazole-Trimethoprim] Itching  . Cherry Other (See Comments)    Unknown  . Gabapentin Other (See Comments)    Unknown  . Invokana [Canagliflozin] Other (See Comments)    Unknown  . Other     Hot peppers   Prior to Admission medications   Medication Sig Start Date End Date Taking? Authorizing Provider  acetaminophen (TYLENOL) 500 MG tablet Take 1,000 mg by mouth every 6 (six) hours as needed for mild pain, fever or headache.    [provider]  Ascorbic Acid (VITAMIN C PO) Take 1 tablet by mouth daily.    [provider]  atorvastatin (LIPITOR) 40 MG tablet Take 1 tablet (40 mg total) by mouth at bedtime. 07/12/20  Cassandria Anger, MD  Caffeine-Magnesium Salicylate (DIUREX PO) Take 200 mg by mouth daily.    [provider]  carvedilol (COREG) 25 MG tablet Take 1 tablet (25 mg total) by mouth 2 (two) times daily with a meal. 08/28/20   [name], Marella Chimes, MD  Cholecalciferol (VITAMIN D3) 125 MCG (5000 UT) CAPS Take 1 capsule (5,000 Units total) by mouth daily. 05/13/18   Cassandria Anger, MD  Continuous Blood Gluc Receiver (FREESTYLE LIBRE 14 DAY READER) DEVI by Does not apply route 4 (four) times daily.    [provider]  Continuous Blood Gluc Receiver (FREESTYLE LIBRE 2 READER) DEVI As directed 03/06/20   Cassandria Anger, MD   Continuous Blood Gluc Sensor (FREESTYLE LIBRE 2 SENSOR) MISC 1 Piece by Does not apply route every 14 (fourteen) days. 03/06/20   Cassandria Anger, MD  doxycycline (ADOXA) 100 MG tablet Take 100 mg by mouth 2 (two) times daily. 10 day supply    [provider]  FLUoxetine (PROZAC) 40 MG capsule Take 40 mg by mouth daily.    [provider]  hydrALAZINE (APRESOLINE) 25 MG tablet Take 1 tablet (25 mg total) by mouth 2 (two) times daily. 05/04/20 05/04/21  Katsadouros, Vasilios, MD  insulin regular human CONCENTRATED (HUMULIN R U-500 KWIKPEN) 500 UNIT/ML kwikpen Inject 50-60 Units into the skin 3 (three) times daily with meals. 07/12/20   Cassandria Anger, MD  levothyroxine (SYNTHROID) 150 MCG tablet Take 1 tablet (150 mcg total) by mouth daily before breakfast. 07/12/20   [name], Marella Chimes, MD  Multiple Vitamin (MULTIVITAMIN WITH MINERALS) TABS tablet Take 1 tablet by mouth daily.    [provider]  neomycin-bacitracin-polymyxin (NEOSPORIN) ointment Apply 1 application topically every 12 (twelve) hours. To legs    [provider]  omeprazole (PRILOSEC) 20 MG capsule Take 1 capsule (20 mg total) by mouth 2 (two) times daily before a meal. For 10 days while on Pylera for H.pylori Patient not taking: Reported on 04/27/2020 03/05/18   [name], PA-C  pantoprazole (PROTONIX) 40 MG tablet TAKE ONE (1) [place] Patient taking differently: Take 40 mg by mouth daily.  12/01/18   Cassandria Anger, MD  Thiamine HCl (VITAMIN B-1 PO) Take 1 tablet by mouth daily.    [provider]  VITAMIN A PO Take 1 tablet by mouth daily.    [provider]  Vitamin D, Ergocalciferol, (DRISDOL) 1.25 MG (50000 UNIT) CAPS capsule Take 1 capsule (50,000 Units total) by mouth every 7 (seven) days. Patient not taking: Reported on 04/27/2020 11/15/19   Cassandria Anger, MD  VITAMIN E PO Take 1 tablet by mouth daily.    [provider]    CT ANGIO AO+BIFEM W & OR WO CONTRAST  Result Date: 09/07/2020 CLINICAL DATA:  Bilateral lower extremity edema and foot ulcers EXAM: CT ANGIOGRAPHY OF ABDOMINAL AORTA WITH ILIOFEMORAL RUNOFF TECHNIQUE: Multidetector CT imaging of the abdomen, pelvis and lower extremities was performed using the standard protocol during bolus administration of intravenous contrast. Multiplanar CT image reconstructions and MIPs were obtained to evaluate the vascular anatomy. CONTRAST:  150mL OMNIPAQUE IOHEXOL 350 MG/ML SOLN COMPARISON:  None. FINDINGS: VASCULAR Aorta: Normal caliber aorta without aneurysm, dissection, vasculitis or significant stenosis. Minimal trace atherosclerotic calcifications. Celiac: Patent without evidence of aneurysm, dissection, vasculitis or significant stenosis. SMA: Patent without evidence of aneurysm, dissection, vasculitis or significant stenosis. Renals: Both renal arteries are patent without evidence of aneurysm, dissection, vasculitis, fibromuscular dysplasia  or significant stenosis. 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 IMA: Patent without evidence of aneurysm, dissection, vasculitis or significant stenosis. RIGHT Lower Extremity Inflow: Common, internal and external iliac arteries are patent without evidence of aneurysm, dissection, vasculitis or significant stenosis. Outflow: Common, superficial and profunda femoral arteries and the popliteal artery are patent without evidence of aneurysm, dissection, vasculitis or significant stenosis. Runoff: Patent three vessel runoff to the ankle. LEFT Lower Extremity Inflow: Common, internal and external iliac arteries are patent without evidence of aneurysm, dissection, vasculitis or significant stenosis. Outflow: Common, superficial and profunda femoral arteries and the popliteal artery are patent without evidence of aneurysm, dissection, vasculitis or significant stenosis. Runoff: Patent three vessel runoff to the ankle. Veins: Markedly enlarged  bilateral superficial great saphenous veins with multiple collateral branches in the calves. Review of the MIP images confirms the above findings. NON-VASCULAR Lower chest: No acute abnormality. Hepatobiliary: Low attenuation of the hepatic parenchyma with subtle sparing around the gallbladder fossa consistent with steatosis. Pancreas: Unremarkable. No pancreatic ductal dilatation or surrounding inflammatory changes. Spleen: Normal in size without focal abnormality. Adrenals/Urinary Tract: Adrenal glands are unremarkable. Kidneys are normal, without renal calculi, focal lesion, or hydronephrosis. Bladder is unremarkable. Stomach/Bowel: Stomach is within normal limits. Appendix appears normal. No evidence of bowel wall thickening, distention, or inflammatory changes. Lymphatic: No suspicious lymphadenopathy. Mildly enlarged bilateral superficial inguinal lymph nodes likely reactive in nature. Reproductive: Status post hysterectomy. No adnexal masses. Other: No abdominal wall hernia or abnormality. No abdominopelvic ascites. Musculoskeletal: Extensive bilateral lower extremity edema beginning just below the knee on the left, and in the mid calf on the right. A fluid collection is present within internal dystrophic calcification in the medial aspect of the left popliteal fossa consistent with a Baker's cyst. Subcutaneous emphysema present along the plantar aspect of the left foot consistent with reported ulcerations. IMPRESSION: VASCULAR 1. No evidence of hemodynamically significant arterial stenosis or occlusion. 2. Enlarged great saphenous veins bilaterally with multiple collaterals. Findings are highly suggestive of superficial venous insufficiency. This may be a contributing factor to the patient's lower extremity swelling and ulcerations. 3. Mild atherosclerotic plaque along the abdominal aorta. Aortic Atherosclerosis (ICD10-170.0) NON-VASCULAR 1. Hepatic steatosis. 2. Left-sided Baker's cyst. 3. Left greater than  right lower extremity edema. 4. Subcutaneous emphysema along the plantar surface of the left foot consistent with the clinical history of soft tissue ulcerations. Signed, Criselda Peaches, MD, [facility] Electronically Signed   By: Jacqulynn Cadet M.D.   On: 09/07/2020 16:18   - pertinent xrays, CT, MRI studies were reviewed and independently interpreted  Positive ROS: All other systems have been reviewed and were otherwise negative with the exception of those mentioned in the HPI and as above.  Physical Exam: General: Alert, no acute distress Psychiatric: Patient is competent for consent with normal mood and affect Lymphatic: No axillary or cervical lymphadenopathy Cardiovascular: No pedal edema Respiratory: No cyanosis, no use of accessory musculature GI: No organomegaly, abdomen is soft and non-tender    Images:  @ENCIMAGES @  Labs:  Lab Results  Component Value Date   HGBA1C 10.1 (H) 07/03/2020   HGBA1C 10.0 (A) 03/06/2020   HGBA1C 10.7 (A) 11/15/2019   REPTSTATUS PENDING 09/08/2020   GRAMSTAIN  09/08/2020    RARE WBC PRESENT, PREDOMINANTLY PMN ABUNDANT GRAM POSITIVE COCCI IN CLUSTERS Performed at [facility], [place] [address], [place], [place] [address]    CULT PENDING 09/08/2020    Lab Results  Component Value Date   ALBUMIN 1.6 (L) 09/06/2020   ALBUMIN 2.6 (L) 04/27/2020   ALBUMIN 3.3 (L) 01/07/2017    Neurologic: Patient does not have protective sensation bilateral lower extremities.   MUSCULOSKELETAL:   Skin: Examination patient has massive ulcers on the plantar aspect of both feet this goes down to tendon fascia and muscle the wound bed still has necrotic nonviable tissue which involves the plantar aspect of both feet.    Patient has a palpable dorsalis pedis pulse bilaterally.  She has massive lymphatic insufficiency of both legs with a circumference of her calf greater than 100  cm.  Patient has uncontrolled type 2 diabetes with a hemoglobin A1c consistently greater than 10 and severe protein caloric malnutrition with an albumin of 1.6.  MRI scan shows massive ulceration on the plantar aspect of both feet but no definite osteomyelitis.  Assessment: Assessment: Massive ulceration bilateral plantar aspect of both feet with uncontrolled type 2 diabetes, severe protein caloric malnutrition and massive swelling of both legs with morbid obesity.  Plan: Plan: Discussed with the patient that limb salvage will be extremely difficult.  We will plan for repeat surgical debridement both feet tomorrow, placement of an installation wound VAC, with follow-up surgery on Wednesday.  Patient ultimately may require bilateral below the knee amputations which will also be extremely hard to heal due to the lymphatic insufficiency and swelling of both legs.  Thank you for the consult and the opportunity to see Ms. Clemetine Marker, MD [place] [phone] 9:07 AM

## 2020-09-10 ENCOUNTER — Inpatient Hospital Stay (HOSPITAL_COMMUNITY): Payer: Medicare Other | Admitting: Registered Nurse

## 2020-09-10 ENCOUNTER — Encounter (HOSPITAL_COMMUNITY)
Admission: EM | Disposition: A | Discharge status: Skilled Nursing Facility | Payer: Self-pay | Source: Home / Self Care | Attending: Family Medicine

## 2020-09-10 ENCOUNTER — Inpatient Hospital Stay (HOSPITAL_COMMUNITY): Payer: Medicare Other

## 2020-09-10 DIAGNOSIS — L97523 Non-pressure chronic ulcer of other part of left foot with necrosis of muscle: Secondary | ICD-10-CM | POA: Diagnosis not present

## 2020-09-10 DIAGNOSIS — N179 Acute kidney failure, unspecified: Secondary | ICD-10-CM

## 2020-09-10 DIAGNOSIS — L02415 Cutaneous abscess of right lower limb: Secondary | ICD-10-CM | POA: Diagnosis not present

## 2020-09-10 DIAGNOSIS — L02416 Cutaneous abscess of left lower limb: Secondary | ICD-10-CM | POA: Diagnosis not present

## 2020-09-10 DIAGNOSIS — Z6841 Body Mass Index (BMI) 40.0 and over, adult: Secondary | ICD-10-CM | POA: Diagnosis not present

## 2020-09-10 DIAGNOSIS — L97513 Non-pressure chronic ulcer of other part of right foot with necrosis of muscle: Secondary | ICD-10-CM | POA: Diagnosis not present

## 2020-09-10 DIAGNOSIS — L03119 Cellulitis of unspecified part of limb: Secondary | ICD-10-CM | POA: Diagnosis not present

## 2020-09-10 DIAGNOSIS — I10 Essential (primary) hypertension: Secondary | ICD-10-CM | POA: Diagnosis not present

## 2020-09-10 DIAGNOSIS — R7881 Bacteremia: Secondary | ICD-10-CM

## 2020-09-10 DIAGNOSIS — R6520 Severe sepsis without septic shock: Secondary | ICD-10-CM

## 2020-09-10 DIAGNOSIS — B9561 Methicillin susceptible Staphylococcus aureus infection as the cause of diseases classified elsewhere: Secondary | ICD-10-CM | POA: Diagnosis not present

## 2020-09-10 HISTORY — PX: I & D EXTREMITY: SHX5045

## 2020-09-10 LAB — GLUCOSE, CAPILLARY
Glucose-Capillary: 271 mg/dL — ABNORMAL HIGH (ref 70–99)
Glucose-Capillary: 235 mg/dL — ABNORMAL HIGH (ref 70–99)
Glucose-Capillary: 165 mg/dL — ABNORMAL HIGH (ref 70–99)
Glucose-Capillary: 122 mg/dL — ABNORMAL HIGH (ref 70–99)
Glucose-Capillary: 99 mg/dL (ref 70–99)

## 2020-09-10 LAB — ECHOCARDIOGRAM COMPLETE
Area-P 1/2: 2.83 cm2
Height: 67.5 in
S' Lateral: 3.1 cm
Weight: 5224.02 oz

## 2020-09-10 SURGERY — IRRIGATION AND DEBRIDEMENT EXTREMITY
Anesthesia: General | Site: Foot | Laterality: Bilateral

## 2020-09-10 MED ORDER — PROMETHAZINE HCL 25 MG/ML IJ SOLN
6.2500 mg | INTRAMUSCULAR | Status: DC | PRN
Start: 2020-09-10 — End: 2020-09-10

## 2020-09-10 MED ORDER — INSULIN ASPART 100 UNIT/ML ~~LOC~~ SOLN
15.0000 [IU] | Freq: Three times a day (TID) | SUBCUTANEOUS | Status: DC
  Administered 2020-09-11 – 2020-09-17 (×12): 15 [IU] via SUBCUTANEOUS

## 2020-09-10 MED ORDER — MIDAZOLAM HCL 2 MG/2ML IJ SOLN: 0.5000 mg | Freq: Once | INTRAMUSCULAR | Status: DC | PRN

## 2020-09-10 MED ORDER — POVIDONE-IODINE 10 % EX SWAB: 2.0000 "application " | Freq: Once | CUTANEOUS | Status: DC

## 2020-09-10 MED ORDER — MEPERIDINE HCL 25 MG/ML IJ SOLN: 6.2500 mg | INTRAMUSCULAR | Status: DC | PRN

## 2020-09-10 MED ORDER — POLYETHYLENE GLYCOL 3350 17 G PO PACK: 17.0000 g | PACK | Freq: Every day | ORAL | Status: DC | PRN

## 2020-09-10 MED ORDER — DOCUSATE SODIUM 100 MG PO CAPS
100.0000 mg | ORAL_CAPSULE | Freq: Two times a day (BID) | ORAL | Status: DC
  Administered 2020-09-10 – 2020-09-15 (×9): 100 mg via ORAL
  Filled 2020-09-10 (×9): qty 1

## 2020-09-10 MED ORDER — ONDANSETRON HCL 4 MG/2ML IJ SOLN
INTRAMUSCULAR | Status: DC | PRN
  Administered 2020-09-10: 4 mg via INTRAVENOUS

## 2020-09-10 MED ORDER — MAGNESIUM CITRATE PO SOLN: 1.0000 | Freq: Once | ORAL | Status: DC | PRN

## 2020-09-10 MED ORDER — INSULIN GLARGINE 100 UNIT/ML ~~LOC~~ SOLN
28.0000 [IU] | Freq: Two times a day (BID) | SUBCUTANEOUS | Status: DC
  Administered 2020-09-10 – 2020-09-17 (×13): 28 [IU] via SUBCUTANEOUS
  Filled 2020-09-10 (×15): qty 0.28

## 2020-09-10 MED ORDER — METOCLOPRAMIDE HCL 5 MG/ML IJ SOLN: 5.0000 mg | Freq: Three times a day (TID) | INTRAMUSCULAR | Status: DC | PRN

## 2020-09-10 MED ORDER — METHOCARBAMOL 500 MG PO TABS
500.0000 mg | ORAL_TABLET | Freq: Four times a day (QID) | ORAL | Status: DC | PRN
  Administered 2020-09-17: 500 mg via ORAL
  Filled 2020-09-10: qty 1

## 2020-09-10 MED ORDER — MIDAZOLAM HCL 2 MG/2ML IJ SOLN
INTRAMUSCULAR | Status: AC
  Filled 2020-09-10: qty 2

## 2020-09-10 MED ORDER — CEFAZOLIN SODIUM-DEXTROSE 2-4 GM/100ML-% IV SOLN
INTRAVENOUS | Status: AC
  Filled 2020-09-10: qty 100

## 2020-09-10 MED ORDER — ENSURE PRE-SURGERY PO LIQD
296.0000 mL | Freq: Once | ORAL | Status: AC
  Administered 2020-09-10: 296 mL via ORAL
  Filled 2020-09-10: qty 296

## 2020-09-10 MED ORDER — CEFAZOLIN SODIUM-DEXTROSE 1-4 GM/50ML-% IV SOLN
INTRAVENOUS | Status: AC
  Filled 2020-09-10: qty 50

## 2020-09-10 MED ORDER — SODIUM CHLORIDE 0.9 % IV SOLN: INTRAVENOUS | Status: DC

## 2020-09-10 MED ORDER — OXYCODONE HCL 5 MG PO TABS
5.0000 mg | ORAL_TABLET | ORAL | Status: DC | PRN
Start: 2020-09-10 — End: 2020-09-14
  Administered 2020-09-10: 5 mg via ORAL
  Administered 2020-09-10: 10 mg via ORAL
  Administered 2020-09-10: 5 mg via ORAL
  Administered 2020-09-12: 10 mg via ORAL
  Filled 2020-09-10: qty 2
  Filled 2020-09-10 (×2): qty 1
  Filled 2020-09-10: qty 2

## 2020-09-10 MED ORDER — OXYCODONE HCL 5 MG PO TABS: 5.0000 mg | ORAL_TABLET | Freq: Once | ORAL | Status: DC | PRN

## 2020-09-10 MED ORDER — LACTATED RINGERS IV SOLN: INTRAVENOUS | Status: DC

## 2020-09-10 MED ORDER — SUCCINYLCHOLINE CHLORIDE 200 MG/10ML IV SOSY
PREFILLED_SYRINGE | INTRAVENOUS | Status: AC
  Filled 2020-09-10: qty 10

## 2020-09-10 MED ORDER — PROPOFOL 10 MG/ML IV BOLUS
INTRAVENOUS | Status: AC
  Filled 2020-09-10: qty 40

## 2020-09-10 MED ORDER — SODIUM CHLORIDE 0.9 % IV SOLN
3.0000 g | Freq: Four times a day (QID) | INTRAVENOUS | Status: DC
  Administered 2020-09-10 – 2020-09-13 (×12): 3 g via INTRAVENOUS
  Filled 2020-09-10 (×2): qty 3
  Filled 2020-09-10 (×9): qty 8
  Filled 2020-09-10: qty 3
  Filled 2020-09-10 (×2): qty 8

## 2020-09-10 MED ORDER — FENTANYL CITRATE (PF) 250 MCG/5ML IJ SOLN
INTRAMUSCULAR | Status: DC | PRN
  Administered 2020-09-10: 25 ug via INTRAVENOUS
  Administered 2020-09-10: 50 ug via INTRAVENOUS

## 2020-09-10 MED ORDER — METHOCARBAMOL 1000 MG/10ML IJ SOLN
500.0000 mg | Freq: Four times a day (QID) | INTRAVENOUS | Status: DC | PRN
  Filled 2020-09-10: qty 5

## 2020-09-10 MED ORDER — ONDANSETRON HCL 4 MG PO TABS: 4.0000 mg | ORAL_TABLET | Freq: Four times a day (QID) | ORAL | Status: DC | PRN

## 2020-09-10 MED ORDER — METOCLOPRAMIDE HCL 5 MG PO TABS: 5.0000 mg | ORAL_TABLET | Freq: Three times a day (TID) | ORAL | Status: DC | PRN

## 2020-09-10 MED ORDER — HYDROMORPHONE HCL 1 MG/ML IJ SOLN: 0.2500 mg | INTRAMUSCULAR | Status: DC | PRN

## 2020-09-10 MED ORDER — DEXMEDETOMIDINE (PRECEDEX) IN NS 20 MCG/5ML (4 MCG/ML) IV SYRINGE
PREFILLED_SYRINGE | INTRAVENOUS | Status: DC | PRN
  Administered 2020-09-10 (×2): 4 ug via INTRAVENOUS

## 2020-09-10 MED ORDER — BISACODYL 10 MG RE SUPP: 10.0000 mg | Freq: Every day | RECTAL | Status: DC | PRN

## 2020-09-10 MED ORDER — LIDOCAINE 2% (20 MG/ML) 5 ML SYRINGE
INTRAMUSCULAR | Status: DC | PRN
  Administered 2020-09-10: 100 mg via INTRAVENOUS

## 2020-09-10 MED ORDER — PROPOFOL 10 MG/ML IV BOLUS
INTRAVENOUS | Status: DC | PRN
  Administered 2020-09-10: 100 mg via INTRAVENOUS

## 2020-09-10 MED ORDER — 0.9 % SODIUM CHLORIDE (POUR BTL) OPTIME
TOPICAL | Status: DC | PRN
  Administered 2020-09-10: 1000 mL

## 2020-09-10 MED ORDER — DEXAMETHASONE SODIUM PHOSPHATE 10 MG/ML IJ SOLN
INTRAMUSCULAR | Status: AC
  Filled 2020-09-10: qty 1

## 2020-09-10 MED ORDER — ACETAMINOPHEN 325 MG PO TABS: 325.0000 mg | ORAL_TABLET | Freq: Four times a day (QID) | ORAL | Status: DC | PRN

## 2020-09-10 MED ORDER — OXYCODONE HCL 5 MG PO TABS
10.0000 mg | ORAL_TABLET | ORAL | Status: DC | PRN
  Administered 2020-09-11 – 2020-09-14 (×6): 15 mg via ORAL
  Administered 2020-09-14 – 2020-09-17 (×3): 10 mg via ORAL
  Filled 2020-09-10: qty 3
  Filled 2020-09-10: qty 2
  Filled 2020-09-10: qty 3
  Filled 2020-09-10: qty 2
  Filled 2020-09-10 (×2): qty 3
  Filled 2020-09-10: qty 2
  Filled 2020-09-10 (×2): qty 3

## 2020-09-10 MED ORDER — CEFAZOLIN SODIUM-DEXTROSE 1-4 GM/50ML-% IV SOLN
INTRAVENOUS | Status: DC | PRN
  Administered 2020-09-10: 1 g via INTRAVENOUS

## 2020-09-10 MED ORDER — LIDOCAINE 2% (20 MG/ML) 5 ML SYRINGE
INTRAMUSCULAR | Status: AC
  Filled 2020-09-10: qty 5

## 2020-09-10 MED ORDER — INSULIN ASPART 100 UNIT/ML ~~LOC~~ SOLN
SUBCUTANEOUS | Status: AC
  Filled 2020-09-10: qty 1

## 2020-09-10 MED ORDER — ONDANSETRON HCL 4 MG/2ML IJ SOLN: 4.0000 mg | Freq: Four times a day (QID) | INTRAMUSCULAR | Status: DC | PRN

## 2020-09-10 MED ORDER — DEXTROSE 5 % IV SOLN
3.0000 g | INTRAVENOUS | Status: DC
  Filled 2020-09-10: qty 3000

## 2020-09-10 MED ORDER — CHLORHEXIDINE GLUCONATE 0.12 % MT SOLN
OROMUCOSAL | Status: AC
  Administered 2020-09-10: 15 mL via OROMUCOSAL
  Filled 2020-09-10: qty 15

## 2020-09-10 MED ORDER — OXYCODONE HCL 5 MG/5ML PO SOLN
5.0000 mg | Freq: Once | ORAL | Status: DC | PRN
Start: 2020-09-10 — End: 2020-09-10

## 2020-09-10 MED ORDER — MIDAZOLAM HCL 2 MG/2ML IJ SOLN
INTRAMUSCULAR | Status: DC | PRN
  Administered 2020-09-10: 2 mg via INTRAVENOUS

## 2020-09-10 MED ORDER — HYDROMORPHONE HCL 1 MG/ML IJ SOLN
0.5000 mg | INTRAMUSCULAR | Status: DC | PRN
  Administered 2020-09-13 – 2020-09-15 (×2): 1 mg via INTRAVENOUS
  Filled 2020-09-10 (×2): qty 1

## 2020-09-10 MED ORDER — CHLORHEXIDINE GLUCONATE 0.12 % MT SOLN: 15.0000 mL | Freq: Once | OROMUCOSAL | Status: AC

## 2020-09-10 MED ORDER — ONDANSETRON HCL 4 MG/2ML IJ SOLN
INTRAMUSCULAR | Status: AC
  Filled 2020-09-10: qty 2

## 2020-09-10 MED ORDER — FENTANYL CITRATE (PF) 250 MCG/5ML IJ SOLN
INTRAMUSCULAR | Status: AC
  Filled 2020-09-10: qty 5

## 2020-09-10 MED ORDER — CHLORHEXIDINE GLUCONATE 4 % EX LIQD: 60.0000 mL | Freq: Once | CUTANEOUS | Status: DC

## 2020-09-10 SURGICAL SUPPLY — 34 items
BLADE SURG 21 STRL SS (BLADE) ×2 IMPLANT
BNDG COHESIVE 6X5 TAN STRL LF (GAUZE/BANDAGES/DRESSINGS) ×2 IMPLANT
BNDG GAUZE ELAST 4 BULKY (GAUZE/BANDAGES/DRESSINGS) ×4 IMPLANT
CANISTER WOUND CARE 500ML ATS (WOUND CARE) IMPLANT
CASSETTE VERAFLO VERALINK (MISCELLANEOUS) IMPLANT
CNTNR URN SCR LID CUP LEK RST (MISCELLANEOUS) IMPLANT
CONT SPEC 4OZ STRL OR WHT (MISCELLANEOUS) ×2
COVER SURGICAL LIGHT HANDLE (MISCELLANEOUS) ×4 IMPLANT
DRAPE BILATERAL LIMB T (DRAPES) ×1 IMPLANT
DRAPE U-SHAPE 47X51 STRL (DRAPES) ×3 IMPLANT
DRSG CLEANSE VERAFLOW (GAUZE/BANDAGES/DRESSINGS) IMPLANT
DRSG PAD ABDOMINAL 8X10 ST (GAUZE/BANDAGES/DRESSINGS) ×2 IMPLANT
DURAPREP 26ML APPLICATOR (WOUND CARE) ×3 IMPLANT
ELECT REM PT RETURN 9FT ADLT (ELECTROSURGICAL)
ELECTRODE REM PT RTRN 9FT ADLT (ELECTROSURGICAL) IMPLANT
GAUZE SPONGE 4X4 12PLY STRL (GAUZE/BANDAGES/DRESSINGS) ×2 IMPLANT
GLOVE BIOGEL PI IND STRL 9 (GLOVE) ×1 IMPLANT
GLOVE BIOGEL PI INDICATOR 9 (GLOVE) ×1
GLOVE SS PI 9.0 STRL (GLOVE) ×2 IMPLANT
GLOVE SURG POLYISO LF SZ6.5 (GLOVE) ×2 IMPLANT
GLOVE SURG SS PI 7.5 STRL IVOR (GLOVE) ×1 IMPLANT
GOWN STRL REUS W/ TWL XL LVL3 (GOWN DISPOSABLE) ×2 IMPLANT
GOWN STRL REUS W/TWL XL LVL3 (GOWN DISPOSABLE) ×6
IV SODIUM CHL 0.9% 500ML (IV SOLUTION) IMPLANT
KIT BASIN OR (CUSTOM PROCEDURE TRAY) ×2 IMPLANT
KIT TURNOVER KIT B (KITS) ×2 IMPLANT
MANIFOLD NEPTUNE II (INSTRUMENTS) ×2 IMPLANT
NS IRRIG 1000ML POUR BTL (IV SOLUTION) ×2 IMPLANT
PACK ORTHO EXTREMITY (CUSTOM PROCEDURE TRAY) ×2 IMPLANT
PAD ARMBOARD 7.5X6 YLW CONV (MISCELLANEOUS) ×2 IMPLANT
STOCKINETTE IMPERVIOUS 9X36 MD (GAUZE/BANDAGES/DRESSINGS) IMPLANT
TOWEL GREEN STERILE (TOWEL DISPOSABLE) ×2 IMPLANT
TUBE CONNECTING 12X1/4 (SUCTIONS) ×2 IMPLANT
YANKAUER SUCT BULB TIP NO VENT (SUCTIONS) ×2 IMPLANT

## 2020-09-10 NOTE — Progress Notes (Signed)
0930 Received pt from PACU, A&O x4. Bil feet with coban dressing dry and intact. Pain meds given as needed. 1800 Foley cath is leaking, baloon checked and re-inflated. Will endorsed.

## 2020-09-10 NOTE — Evaluation (Signed)
Physical Therapy Evaluation Patient Details Name: Michelle Skinner MRN: [id number] DOB: 07/19/1965 Today's Date: 09/10/2020   History of Present Illness  Pt is a 55 y.o. female who presented to the ED 09/06/20 with open wounds at bil feet. Per chart, 3 weeks PTA she sustained a "bone bruise" of her R lateral foot that progressed to increased redness and pain and later spread to her L foot. Pt found to be septic 2/2 cellulitis involving feet with lactic acidosis, leukocytosism, AKI, tachycardia, and tachypnea. S/p sharp debridement of wounds 09/08/20 and irrigation with debridement 09/10/20. Possible need for amputations. PMH: morbid obesity, DM2 on insulin w/ peripheral neuropathy, HTN, GERD, and cervical cancer.  Clinical Impression  Pt with condition mentioned above and deficits mentioned below, see PT Problem List. PTA, she was independent with all functional mobility, intermittently utilizing her SPC. She assists her husband as he recently had a BKA. Currently, pt requiring modA for all bed mobility due to her pain limiting her strength, coordination, and mobility of her legs along with activity tolerance. Pt is NWB on bil lower extremities, per ortho procedure note. Pt is at high risk for falls and pressure wounds. Will continue to follow acutely. Recommend follow-up at SNF to maximize her independence and safety with all functional mobility at this time. However, if pt does end up having amputations she would greatly benefit from intensive therapies in the CIR setting as she is young and needs to care for her husband and was mod I PTA.    Follow Up Recommendations SNF (CIR if she has amputations)    Equipment Recommendations  Rolling walker with 5" wheels;3in1 (PT);Wheelchair (measurements PT);Wheelchair cushion (measurements PT)    Recommendations for Other Services OT consult     Precautions / Restrictions Precautions Precautions: Fall Precaution Comments: NWB bil legs Restrictions Weight  Bearing Restrictions: Yes Other Position/Activity Restrictions: NWB Bil lower extremities      Mobility  Bed Mobility Overal bed mobility: Needs Assistance Bed Mobility: Rolling;Supine to Sit;Sit to Supine Rolling: Mod assist   Supine to sit: Mod assist Sit to supine: Mod assist   General bed mobility comments: Utilized paper pads under each lower leg to assist in lifting each leg with all bed mobility to prevent pain, success. Pt needing extensive periods of time (>5 min) to perform either supine > sit or sit > supine with modA to manage legs and modA for pt to pull up on PT's hand to ascend trunk to long-sit in bed. Use of rails to roll, modA to complete.    Transfers                 General transfer comment: NWB bil legs, deferred  Ambulation/Gait             General Gait Details: NWB bil legs, deferred  Stairs            Wheelchair Mobility    Modified Rankin (Stroke Patients Only)       Balance Overall balance assessment: Needs assistance Sitting-balance support: Bilateral upper extremity supported;Feet unsupported Sitting balance-Leahy Scale: Poor Sitting balance - Comments: Bil UEs with unsquared hips sitting EOB statically, >10 min. Supervision.       Standing balance comment: NWB bil legs, deferred                             Pertinent Vitals/Pain Pain Assessment: 0-10 Pain Score: 9  Pain Location: bil feet Pain Descriptors /  Indicators: Burning;Sharp;Stabbing;Grimacing;Guarding Pain Intervention(s): Monitored during session;Limited activity within patient's tolerance;Repositioned;Patient requesting pain meds-RN notified    Home Living Family/patient expects to be discharged to:: Private residence Living Arrangements: Spouse/significant other Available Help at Discharge: Family;Available PRN/intermittently (unknown amount of assistance available from son; pt helps her husband as he just had a BKA himself) Type of Home:  Apartment Home Access: Ramped entrance     Home Layout: One level Home Equipment: Cane - single point;Walker - 2 wheels;Wheelchair - manual;Shower seat (husband uses RW and w/c) Additional Comments: Pt sleeps on love seat, says she does not have a bed.    Prior Function Level of Independence: Independent with assistive device(s)         Comments: Pt independent with intermittent use of SPC. Pt cares for her husband who just had a BKA. Pt drives.     Hand Dominance   Dominant Hand: Right    Extremity/Trunk Assessment   Upper Extremity Assessment Upper Extremity Assessment: Overall WFL for tasks assessed (slightly limited shoulder flexion strength in bil UEs due to neck pain; denies numbness/tingling; intact coordination)    Lower Extremity Assessment Lower Extremity Assessment: Generalized weakness (limited assessment due to pain)    Cervical / Trunk Assessment Cervical / Trunk Assessment: Kyphotic  Communication   Communication: No difficulties  Cognition Arousal/Alertness: Awake/alert Behavior During Therapy: WFL for tasks assessed/performed Overall Cognitive Status: Within Functional Limits for tasks assessed                                        General Comments General comments (skin integrity, edema, etc.): Edema and erythema noted bil lower legs    Exercises     Assessment/Plan    PT Assessment Patient needs continued PT services  PT Problem List Decreased strength;Decreased range of motion;Decreased activity tolerance;Decreased balance;Decreased mobility;Decreased coordination;Pain;Obesity       PT Treatment Interventions DME instruction;Gait training;Functional mobility training;Therapeutic activities;Balance training;Therapeutic exercise;Neuromuscular re-education;Patient/family education;Wheelchair mobility training    PT Goals (Current goals can be found in the Care Plan section)  Acute Rehab PT Goals Patient Stated Goal: to get  better PT Goal Formulation: With patient Time For Goal Achievement: 09/24/20 Potential to Achieve Goals: Fair    Frequency Min 2X/week   Barriers to discharge Decreased caregiver support      Co-evaluation               AM-PAC PT "6 Clicks" Mobility  Outcome Measure Help needed turning from your back to your side while in a flat bed without using bedrails?: A Lot Help needed moving from lying on your back to sitting on the side of a flat bed without using bedrails?: A Lot Help needed moving to and from a bed to a chair (including a wheelchair)?: Total Help needed standing up from a chair using your arms (e.g., wheelchair or bedside chair)?: Total Help needed to walk in hospital room?: Total Help needed climbing 3-5 steps with a railing? : Total 6 Click Score: 8    End of Session   Activity Tolerance: Patient limited by pain Patient left: in bed;with nursing/sitter in room Nurse Communication: Mobility status;Patient requests pain meds PT Visit Diagnosis: Muscle weakness (generalized) (M62.81);Difficulty in walking, not elsewhere classified (R26.2);Pain Pain - Right/Left:  (bil) Pain - part of body: Leg    Time: 1720-1801 PT Time Calculation (min) (ACUTE ONLY): 41 min   Charges:  PT Evaluation $PT Eval Moderate Complexity: 1 Mod PT Treatments $Therapeutic Activity: 23-37 mins        Moishe Spice, PT, DPT Acute Rehabilitation Services  Pager: [phone] Office: [place] 09/10/2020, 6:23 PM

## 2020-09-10 NOTE — NC FL2 (Signed)
[place] LEVEL OF CARE SCREENING TOOL     IDENTIFICATION  Patient Name: Michelle Skinner Birthdate: [birth year] Sex: female Admission Date (Current Location): 09/06/2020  [facility] and [place] Number:  Herbalist and Address:  [facility], [place] [address] [place], [place] [address]      Provider Number: [id number]  Attending Physician Name and Address:  Murlean Iba, MD  Relative Name and Phone Number:       Current Level of Care:   Recommended Level of Care: [place] Prior Approval Number:    Date Approved/Denied:   PASRR Number: pending  Discharge Plan: SNF    Current Diagnoses: Patient Active Problem List   Diagnosis Date Noted  . Severe protein-calorie malnutrition ([place])   . BMI 50.0-59.9, adult ([place])   . Plantar ulcer of right foot ([place])   . Plantar ulcer of left foot ([place])   . Lymphedema   . Sepsis ([place]) 09/06/2020  . Cellulitis 04/27/2020  . AKI (acute kidney injury) ([place]) 04/27/2020  . Stasis dermatitis 04/27/2020  . Anxiety 03/31/2020  . Depression 03/31/2020  . GERD (gastroesophageal reflux disease) 03/31/2020  . Posttraumatic stress disorder 03/31/2020  . Personal history of noncompliance with medical treatment, presenting hazards to health 02/09/2018  . Abnormal CT of the abdomen 10/20/2017  . Dilated cbd, acquired 10/20/2017  . Essential hypertension, benign 01/14/2017  . Primary hypothyroidism 05/03/2015  . Mixed hyperlipidemia 05/03/2015  . Uncontrolled type 2 diabetes mellitus with complication, with long-term current use of insulin ([place]) 04/25/2015  . Morbid (severe) obesity due to excess calories ([place]) 04/25/2015  . Vitamin D deficiency 04/25/2015  . Cervical cancer ([place]) 10/05/2012    Orientation RESPIRATION BLADDER Height & Skinner     Self,Situation,Time,Place  Normal Continent Skinner: (!) 326 lb 8 oz (148.1 kg) Height:  5' 7.5" (171.5 cm)  BEHAVIORAL SYMPTOMS/MOOD  NEUROLOGICAL BOWEL NUTRITION STATUS      Continent Diet (See discharge summary)  AMBULATORY STATUS COMMUNICATION OF NEEDS Skin   Extensive Assist Verbally Normal                       Personal Care Assistance Level of Assistance  Bathing,Feeding,Dressing Bathing Assistance: Maximum assistance Feeding assistance: Limited assistance Dressing Assistance: Maximum assistance     Functional Limitations Info  Sight,Speech,Hearing Sight Info: Adequate Hearing Info: Adequate Speech Info: Adequate    SPECIAL CARE FACTORS FREQUENCY  PT (By licensed PT),OT (By licensed OT)     PT Frequency: 5x a week OT Frequency: 5x a week            Contractures Contractures Info: Not present    Additional Factors Info  Code Status,Allergies,Insulin Sliding Scale Code Status Info: full Allergies Info: Sulfa Antibiotics   Clindamycin/lincomycin   Invokana (Canagliflozin)   Latex   Tape   Bactrim (Sulfamethoxazole-trimethoprim)   Cherry   Gabapentin   Other   Insulin Sliding Scale Info: Novolog 0-20 units 3x a day, Novolog 70units daily, Lantus 28units 2x a day       Current Medications (09/10/2020):  This is the current hospital active medication list Current Facility-Administered Medications  Medication Dose Route Frequency Provider Last Rate Last Admin  . 0.9 %  sodium chloride infusion  250 mL Intravenous Continuous [name], MD 10 mL/hr at 09/09/20 2256 250 mL at 09/09/20 2256  . 0.9 %  sodium chloride infusion   Intravenous Continuous Kule Gascoigne, Clanford L, MD 75  mL/hr at 09/09/20 0657 New Bag at 09/09/20 0657  . 0.9 %  sodium chloride infusion   Intravenous Continuous [name], MD      . Derrill Memo ON 09/11/2020] acetaminophen (TYLENOL) tablet 325-650 mg  325-650 mg Oral Q6H PRN [name], MD      . atorvastatin (LIPITOR) tablet 40 mg  40 mg Oral QHS [name], MD   40 mg at 09/09/20 2258  . bisacodyl (DULCOLAX) suppository 10 mg  10 mg Rectal Daily PRN [name], MD       . carvedilol (COREG) tablet 12.5 mg  12.5 mg Oral BID WC [name], MD   12.5 mg at 09/10/20 0938  . ceFAZolin (ANCEF) IVPB 2g/100 mL premix  2 g Intravenous Q8H [name], MD 200 mL/hr at 09/10/20 0551 2 g at 09/10/20 0551  . Chlorhexidine Gluconate Cloth 2 % PADS 6 each  6 each Topical Daily [name], MD   6 each at 09/10/20 0544  . cholecalciferol (VITAMIN D3) tablet 5,000 Units  5,000 Units Oral Daily [name], MD   5,000 Units at 09/10/20 1105  . dextrose 50 % solution 0-50 mL  0-50 mL Intravenous PRN [name], MD      . docusate sodium (COLACE) capsule 100 mg  100 mg Oral BID [name], MD      . enoxaparin (LOVENOX) injection 70 mg  70 mg Subcutaneous Q24H [name], MD   70 mg at 09/09/20 2013  . FLUoxetine (PROZAC) capsule 40 mg  40 mg Oral Daily [name], MD   40 mg at 09/10/20 1102  . HYDROmorphone (DILAUDID) injection 0.5-1 mg  0.5-1 mg Intravenous Q4H PRN [name], MD      . insulin aspart (novoLOG) injection 0-20 Units  0-20 Units Subcutaneous TID WC [name], MD   7 Units at 09/10/20 [phone]  . insulin aspart (novoLOG) injection 15 Units  15 Units Subcutaneous TID WC Dinnis Rog, Clanford L, MD      . insulin glargine (LANTUS) injection 28 Units  28 Units Subcutaneous BID Ja Ohman, Clanford L, MD      . levothyroxine (SYNTHROID) tablet 150 mcg  150 mcg Oral QAC breakfast [name], MD   150 mcg at 09/10/20 0550  . magnesium citrate solution 1 Bottle  1 Bottle Oral Once PRN [name], MD      . magnesium oxide (MAG-OX) tablet 200 mg  200 mg Oral Daily [name], MD   200 mg at 09/10/20 1059  . methocarbamol (ROBAXIN) tablet 500 mg  500 mg Oral Q6H PRN [name], MD       Or  . methocarbamol (ROBAXIN) 500 mg in dextrose 5 % 50 mL IVPB  500 mg Intravenous Q6H PRN [name], MD      . metoCLOPramide (REGLAN) tablet 5-10 mg  5-10 mg Oral Q8H PRN [name], MD       Or  . metoCLOPramide (REGLAN) injection  5-10 mg  5-10 mg Intravenous Q8H PRN [name], MD      . ondansetron [facility]) tablet 4 mg  4 mg Oral Q6H PRN [name], MD       Or  . ondansetron [facility]) injection 4 mg  4 mg Intravenous Q6H PRN [name], MD      . oxyCODONE (Oxy IR/ROXICODONE) immediate release tablet 10-15 mg  10-15 mg  Oral Q4H PRN [name], MD      . oxyCODONE (Oxy IR/ROXICODONE) immediate release tablet 5-10 mg  5-10 mg Oral Q4H PRN [name], MD   5 mg at 09/10/20 1100  . pantoprazole (PROTONIX) EC tablet 40 mg  40 mg Oral Daily [name], MD   40 mg at 09/10/20 1101  . polyethylene glycol (MIRALAX / GLYCOLAX) packet 17 g  17 g Oral Daily PRN [name], MD      . senna [facility]) tablet 8.6 mg  1 tablet Oral BID [name], MD   8.6 mg at 09/10/20 1103  . sodium hypochlorite (DAKIN'S 1/4 STRENGTH) topical solution   Irrigation BID [name], MD   Given at 09/09/20 2347  . traZODone (DESYREL) tablet 25 mg  25 mg Oral QHS PRN [name], MD   25 mg at 09/08/20 2059     Discharge Medications: Please see discharge summary for a list of discharge medications.  Relevant Imaging Results:  Relevant Lab Results:   Additional Information SSN [place], [place]

## 2020-09-10 NOTE — Anesthesia Preprocedure Evaluation (Addendum)
Anesthesia Evaluation  Patient identified by MRN, date of birth, ID band Patient awake    Reviewed: Allergy & Precautions, NPO status , Patient's Chart, lab work & pertinent test results, reviewed documented beta blocker date and time   History of Anesthesia Complications Negative for: history of anesthetic complications  Airway Mallampati: II  TM Distance: >3 FB Neck ROM: Full    Dental  (+) Poor Dentition, Missing, Dental Advisory Given   Pulmonary neg pulmonary ROS,  09/06/2020 SARS coronavirus NEG   breath sounds clear to auscultation       Cardiovascular hypertension, Pt. on medications and Pt. on home beta blockers (-) angina Rhythm:Regular Rate:Normal     Neuro/Psych PSYCHIATRIC DISORDERS (PTSD) Anxiety Depression negative neurological ROS     GI/Hepatic Neg liver ROS, GERD  Medicated and Controlled,  Endo/Other  diabetes (glu 271), Insulin DependentHypothyroidism Morbid obesity  Renal/GU Renal InsufficiencyRenal disease   H/o cervical cancer    Musculoskeletal   Abdominal (+) + obese,   Peds  Hematology  (+) Blood dyscrasia (Hb 8.8, INR 1.4), anemia ,   Anesthesia Other Findings   Reproductive/Obstetrics                            Anesthesia Physical Anesthesia Plan  ASA: III  Anesthesia Plan: General   Post-op Pain Management:    Induction: Intravenous  PONV Risk Score and Plan: 3 and Ondansetron, Dexamethasone and Treatment may vary due to age or medical condition  Airway Management Planned: LMA  Additional Equipment: None  Intra-op Plan:   Post-operative Plan:   Informed Consent: I have reviewed the patients History and Physical, chart, labs and discussed the procedure including the risks, benefits and alternatives for the proposed anesthesia with the patient or authorized representative who has indicated his/her understanding and acceptance.     Dental advisory  given  Plan Discussed with: CRNA and Surgeon  Anesthesia Plan Comments:        Anesthesia Quick Evaluation

## 2020-09-10 NOTE — Anesthesia Postprocedure Evaluation (Signed)
Anesthesia Post Note  Patient: Michelle Skinner  Procedure(s) Performed: IRRIGATION AND DEBRIDEMENT FEET; (Bilateral Foot)     Patient location during evaluation: PACU Anesthesia Type: General Level of consciousness: awake and alert, patient cooperative and oriented Pain management: pain level controlled Vital Signs Assessment: post-procedure vital signs reviewed and stable Respiratory status: spontaneous breathing, nonlabored ventilation and respiratory function stable Cardiovascular status: blood pressure returned to baseline and stable Postop Assessment: no apparent nausea or vomiting Anesthetic complications: no   No complications documented.  Last Vitals:  Vitals:   09/10/20 0910 09/10/20 0931  BP:  136/63  Pulse:  69  Resp: 16 17  Temp: (!) 36.1 C 36.5 C  SpO2:      Last Pain:  Vitals:   09/10/20 0931  TempSrc: Oral  PainSc: 0-No pain                 JACKSON,E. CARSWELL

## 2020-09-10 NOTE — Social Work (Signed)
To Whom It May Concern:  Please be advised that the above-named patient will require a short-term nursing home stay - anticipated 30 days or less for rehabilitation and strengthening.  The plan is for return home.  

## 2020-09-10 NOTE — Anesthesia Procedure Notes (Signed)
Procedure Name: LMA Insertion Date/Time: 09/10/2020 8:01 AM Performed by: Rande Brunt, CRNA Pre-anesthesia Checklist: Patient identified, Emergency Drugs available, Suction available and Patient being monitored Patient Re-evaluated:Patient Re-evaluated prior to induction Oxygen Delivery Method: Circle System Utilized Preoxygenation: Pre-oxygenation with 100% oxygen Induction Type: IV induction Ventilation: Mask ventilation without difficulty LMA: LMA inserted LMA Size: 5.0 Number of attempts: 1 Placement Confirmation: positive ETCO2 Tube secured with: Tape Dental Injury: Teeth and Oropharynx as per pre-operative assessment

## 2020-09-10 NOTE — Transfer of Care (Signed)
Immediate Anesthesia Transfer of Care Note  Patient: [name]  Procedure(s) Performed: IRRIGATION AND DEBRIDEMENT FEET; (Bilateral Foot)  Patient Location: PACU  Anesthesia Type:General  Level of Consciousness: awake, alert  and oriented  Airway & Oxygen Therapy: Patient Spontanous Breathing  Post-op Assessment: Report given to RN, Post -op Vital signs reviewed and stable and Patient moving all extremities  Post vital signs: Reviewed and stable  Last Vitals:  Vitals Value Taken Time  BP 117/62 09/10/20 0841  Temp    Pulse 70 09/10/20 0843  Resp 24 09/10/20 0843  SpO2 96 % 09/10/20 0843  Vitals shown include unvalidated device data.  Last Pain:  Vitals:   09/10/20 0447  TempSrc: Oral  PainSc:       Patients Stated Pain Goal: 3 (03/50/09 3818)  Complications: No complications documented.

## 2020-09-10 NOTE — Progress Notes (Signed)
[facility]   Reason for visit: Follow up on MSSA bacteremia  Interval History: growth from operative culture on 3/4 from AP growing STaph aureus, Enterococcus and mixed organisms; s/p operative debridement today by Dr. Sharol Given and she has extensive infections of both legs that will require amputation.    day 5 total antibiotics Day 3 cefazolin  Physical Exam: Constitutional:  Vitals:   09/10/20 1044 09/10/20 1140  BP: (!) 147/60 (!) 125/57  Pulse: 71 66  Resp: 18   Temp: 98.3 F (36.8 C) 98.2 F (36.8 C)  SpO2: 100% 98%   patient appears in NAD Respiratory: Normal respiratory effort; CTA B Cardiovascular: RRR GI: soft, nt, nd  Review of Systems: Constitutional: negative for fevers and chills Gastrointestinal: negative for diarrhea  Lab Results  Component Value Date   WBC 21.2 (H) 09/07/2020   HGB 8.8 (L) 09/07/2020   HCT 27.3 (L) 09/07/2020   MCV 86.9 09/07/2020   PLT 353 09/07/2020    Lab Results  Component Value Date   CREATININE 1.79 (H) 09/07/2020   BUN 51 (H) 09/07/2020   NA 132 (L) 09/07/2020   K 3.3 (L) 09/07/2020   CL 98 09/07/2020   CO2 22 09/07/2020    Lab Results  Component Value Date   ALT 14 09/06/2020   AST 12 (L) 09/06/2020   ALKPHOS 120 09/06/2020     Microbiology: Recent Results (from the past 240 hour(s))  Resp Panel by RT-PCR (Flu A&B, Covid) Nasopharyngeal Swab     Status: None   Collection Time: 09/06/20  2:01 PM   Specimen: Nasopharyngeal Swab; Nasopharyngeal(NP) swabs in vial transport medium  Result Value Ref Range Status   SARS Coronavirus 2 by RT PCR NEGATIVE NEGATIVE Final    Comment: (NOTE) SARS-CoV-2 target nucleic acids are NOT DETECTED.  The SARS-CoV-2 RNA is generally detectable in upper respiratory specimens during the acute phase of infection. The lowest concentration of SARS-CoV-2 viral copies this assay can detect is 138 copies/mL. A negative result does not preclude  SARS-Cov-2 infection and should not be used as the sole basis for treatment or other patient management decisions. A negative result may occur with  improper specimen collection/handling, submission of specimen other than nasopharyngeal swab, presence of viral mutation(s) within the areas targeted by this assay, and inadequate number of viral copies(<138 copies/mL). A negative result must be combined with clinical observations, patient history, and epidemiological information. The expected result is Negative.  Fact Sheet for Patients:  EntrepreneurPulse.com.au  Fact Sheet for Healthcare Providers:  IncredibleEmployment.be  This test is no t yet approved or cleared by the [place] FDA and  has been authorized for detection and/or diagnosis of SARS-CoV-2 by FDA under an Emergency Use Authorization (EUA). This EUA will remain  in effect (meaning this test can be used) for the duration of the COVID-19 declaration under Section 564(b)(1) of the Act, 21 U.S.C.section 360bbb-3(b)(1), unless the authorization is terminated  or revoked sooner.       Influenza A by PCR NEGATIVE NEGATIVE Final   Influenza B by PCR NEGATIVE NEGATIVE Final    Comment: (NOTE) The Xpert Xpress SARS-CoV-2/FLU/RSV plus assay is intended as an aid in the diagnosis of influenza from Nasopharyngeal swab specimens and should not be used as a sole basis for treatment. Nasal washings and aspirates are unacceptable for Xpert Xpress SARS-CoV-2/FLU/RSV testing.  Fact Sheet for Patients: EntrepreneurPulse.com.au  Fact Sheet for Healthcare Providers: IncredibleEmployment.be  This test is not yet  approved or cleared by the [place] and has been authorized for detection and/or diagnosis of SARS-CoV-2 by FDA under an Emergency Use Authorization (EUA). This EUA will remain in effect (meaning this test can be used) for the duration of  the COVID-19 declaration under Section 564(b)(1) of the Act, 21 U.S.C. section 360bbb-3(b)(1), unless the authorization is terminated or revoked.  Performed at [facility], [address] [place], [place] [address]   Urine culture     Status: Abnormal   Collection Time: 09/06/20  2:01 PM   Specimen: In/Out Cath Urine  Result Value Ref Range Status   Specimen Description   Final    IN/OUT CATH URINE Performed at [facility], [address] [place], [place] [address]    Special Requests   Final    NONE Performed at [facility], [address] [place], [place] [address]    Culture (A)  Final    1,000 COLONIES/mL PROTEUS MIRABILIS 3,000 COLONIES/mL KLEBSIELLA PNEUMONIAE    Report Status 09/09/2020 FINAL  Final   Organism ID, Bacteria PROTEUS MIRABILIS (A)  Final   Organism ID, Bacteria KLEBSIELLA PNEUMONIAE (A)  Final      Susceptibility   Klebsiella pneumoniae - MIC*    AMPICILLIN RESISTANT Resistant     CEFAZOLIN <=4 SENSITIVE Sensitive     CEFEPIME <=0.12 SENSITIVE Sensitive     CEFTRIAXONE <=0.25 SENSITIVE Sensitive     CIPROFLOXACIN <=0.25 SENSITIVE Sensitive     GENTAMICIN <=1 SENSITIVE Sensitive     IMIPENEM <=0.25 SENSITIVE Sensitive     NITROFURANTOIN 32 SENSITIVE Sensitive     TRIMETH/SULFA <=20 SENSITIVE Sensitive     AMPICILLIN/SULBACTAM <=2 SENSITIVE Sensitive     PIP/TAZO <=4 SENSITIVE Sensitive     * 3,000 COLONIES/mL KLEBSIELLA PNEUMONIAE   Proteus mirabilis - MIC*    AMPICILLIN <=2 SENSITIVE Sensitive     CEFAZOLIN 8 SENSITIVE Sensitive     CEFEPIME <=0.12 SENSITIVE Sensitive     CEFTRIAXONE <=0.25 SENSITIVE Sensitive     CIPROFLOXACIN >=4 RESISTANT Resistant     GENTAMICIN <=1 SENSITIVE Sensitive     IMIPENEM 2 SENSITIVE Sensitive     NITROFURANTOIN 128 RESISTANT Resistant     TRIMETH/SULFA >=320 RESISTANT Resistant     AMPICILLIN/SULBACTAM <=2 SENSITIVE Sensitive     PIP/TAZO <=4 SENSITIVE Sensitive     * 1,000 COLONIES/mL PROTEUS MIRABILIS   Blood Culture (routine x 2)     Status: Abnormal   Collection Time: 09/06/20  2:14 PM   Specimen: BLOOD  Result Value Ref Range Status   Specimen Description   Final    BLOOD LEFT ANTECUBITAL Performed at [facility], [address] [place], [place] [address]    Special Requests   Final    BOTTLES DRAWN AEROBIC AND ANAEROBIC Blood Culture adequate volume Performed at [facility], [address] [place], [place] [address]    Culture  Setup Time   Final    GRAM POSITIVE COCCI IN BOTH AEROBIC AND ANAEROBIC BOTTLES Gram Stain Report Called to,Read Back By and Verified With: LARIMORE,[email or web address]  BY MATTHEWS, B 3.3.2022 Performed at [facility] to follow CRITICAL RESULT CALLED TO, READ BACK BY AND VERIFIED WITHMartin Majestic RN [id number] 09/07/20 A BROWNING Performed at [facility], [address], [place], [place] [address]    Culture STAPHYLOCOCCUS AUREUS (A)  Final   Report Status 09/09/2020 FINAL  Final   Organism ID, Bacteria STAPHYLOCOCCUS AUREUS  Final  Susceptibility   Staphylococcus aureus - MIC*    CIPROFLOXACIN <=0.5 SENSITIVE Sensitive     ERYTHROMYCIN <=0.25 SENSITIVE Sensitive     GENTAMICIN <=0.5 SENSITIVE Sensitive     OXACILLIN <=0.25 SENSITIVE Sensitive     TETRACYCLINE <=1 SENSITIVE Sensitive     VANCOMYCIN <=0.5 SENSITIVE Sensitive     TRIMETH/SULFA <=10 SENSITIVE Sensitive     CLINDAMYCIN <=0.25 SENSITIVE Sensitive     RIFAMPIN <=0.5 SENSITIVE Sensitive     Inducible Clindamycin NEGATIVE Sensitive     * STAPHYLOCOCCUS AUREUS  Blood Culture ID Panel (Reflexed)     Status: Abnormal   Collection Time: 09/06/20  2:14 PM  Result Value Ref Range Status   Enterococcus faecalis NOT DETECTED NOT DETECTED Final   Enterococcus Faecium NOT DETECTED NOT DETECTED Final   Listeria monocytogenes NOT DETECTED NOT DETECTED Final   Staphylococcus species DETECTED (A) NOT DETECTED Final    Comment: CRITICAL RESULT CALLED TO, READ BACK BY AND VERIFIED  WITH: Martin Majestic RN [id number] 09/07/20 A BROWNING    Staphylococcus aureus (BCID) DETECTED (A) NOT DETECTED Final    Comment: CRITICAL RESULT CALLED TO, READ BACK BY AND VERIFIED WITH: Martin Majestic RN [phone] 09/07/20 A BROWNING    Staphylococcus epidermidis NOT DETECTED NOT DETECTED Final   Staphylococcus lugdunensis NOT DETECTED NOT DETECTED Final   Streptococcus species NOT DETECTED NOT DETECTED Final   Streptococcus agalactiae NOT DETECTED NOT DETECTED Final   Streptococcus pneumoniae NOT DETECTED NOT DETECTED Final   Streptococcus pyogenes NOT DETECTED NOT DETECTED Final   A.calcoaceticus-baumannii NOT DETECTED NOT DETECTED Final   Bacteroides fragilis NOT DETECTED NOT DETECTED Final   Enterobacterales NOT DETECTED NOT DETECTED Final   Enterobacter cloacae complex NOT DETECTED NOT DETECTED Final   Escherichia coli NOT DETECTED NOT DETECTED Final   Klebsiella aerogenes NOT DETECTED NOT DETECTED Final   Klebsiella oxytoca NOT DETECTED NOT DETECTED Final   Klebsiella pneumoniae NOT DETECTED NOT DETECTED Final   Proteus species NOT DETECTED NOT DETECTED Final   Salmonella species NOT DETECTED NOT DETECTED Final   Serratia marcescens NOT DETECTED NOT DETECTED Final   Haemophilus influenzae NOT DETECTED NOT DETECTED Final   Neisseria meningitidis NOT DETECTED NOT DETECTED Final   Pseudomonas aeruginosa NOT DETECTED NOT DETECTED Final   Stenotrophomonas maltophilia NOT DETECTED NOT DETECTED Final   Candida albicans NOT DETECTED NOT DETECTED Final   Candida auris NOT DETECTED NOT DETECTED Final   Candida glabrata NOT DETECTED NOT DETECTED Final   Candida krusei NOT DETECTED NOT DETECTED Final   Candida parapsilosis NOT DETECTED NOT DETECTED Final   Candida tropicalis NOT DETECTED NOT DETECTED Final   Cryptococcus neoformans/gattii NOT DETECTED NOT DETECTED Final   Meth resistant mecA/C and MREJ NOT DETECTED NOT DETECTED Final    Comment: Performed at [facility], [address]  [place], [place] [address]  Blood Culture (routine x 2)     Status: None (Preliminary result)   Collection Time: 09/06/20  3:59 PM   Specimen: BLOOD RIGHT HAND  Result Value Ref Range Status   Specimen Description BLOOD RIGHT HAND  Final   Special Requests   Final    BOTTLES DRAWN AEROBIC AND ANAEROBIC Blood Culture adequate volume   Culture   Final    NO GROWTH 4 DAYS Performed at [facility], [address], [place], [place] [address]    Report Status PENDING  Incomplete  MRSA PCR Screening     Status: None   Collection Time: 09/06/20  9:46 PM   Specimen: Nasal Mucosa; Nasopharyngeal  Result Value Ref Range Status   MRSA by PCR NEGATIVE NEGATIVE Final    Comment:        The GeneXpert MRSA Assay (FDA approved for NASAL specimens only), is one component of a comprehensive MRSA colonization surveillance program. It is not intended to diagnose MRSA infection nor to guide or monitor treatment for MRSA infections. Performed at [facility], [address], [place], [place] [address]   Aerobic/Anaerobic Culture w Gram Stain (surgical/deep wound)     Status: None (Preliminary result)   Collection Time: 09/08/20 11:04 AM   Specimen: PATH Other; Tissue  Result Value Ref Range Status   Specimen Description   Final    FOOT RIGHT Performed at [facility], [address], [place], [place] [address]    Special Requests   Final    NONE Performed at [facility], [address], [place], [place] [address]    Gram Stain   Final    RARE WBC PRESENT, PREDOMINANTLY PMN MODERATE GRAM POSITIVE COCCI IN PAIRS IN CLUSTERS    Culture   Final    ABUNDANT STAPHYLOCOCCUS AUREUS SUSCEPTIBILITIES TO FOLLOW Performed at [facility], [place] [address], [place], [place] [address]    Report Status PENDING  Incomplete  Aerobic/Anaerobic Culture w Gram Stain (surgical/deep wound)     Status: None (Preliminary result)   Collection Time: 09/08/20 11:45 AM   Specimen: PATH Other; Tissue  Result Value  Ref Range Status   Specimen Description   Final    FOOT RIGHT Performed at [facility], [address] [place], [place] [address]    Special Requests   Final    NONE Performed at [facility], [address] [place], [place] [address]    Gram Stain   Final    FEW WBC PRESENT, PREDOMINANTLY PMN MODERATE GRAM POSITIVE COCCI IN PAIRS IN CLUSTERS RARE GRAM NEGATIVE RODS Performed at [facility], [place] [address], [place], [place] [address]    Culture MODERATE STAPHYLOCOCCUS AUREUS  Final   Report Status PENDING  Incomplete  Aerobic/Anaerobic Culture w Gram Stain (surgical/deep wound)     Status: None (Preliminary result)   Collection Time: 09/08/20 11:46 AM   Specimen: PATH Other; Tissue  Result Value Ref Range Status   Specimen Description   Final    FOOT LEFT Performed at [facility], [address] [place], [place] [address]    Special Requests   Final    NONE Performed at [facility], [address] [place], [place] [address]    Gram Stain   Final    RARE WBC PRESENT, PREDOMINANTLY PMN ABUNDANT GRAM POSITIVE COCCI IN CLUSTERS    Culture   Final    ABUNDANT ENTEROCOCCUS FAECALIS MODERATE STAPHYLOCOCCUS AUREUS SUSCEPTIBILITIES TO FOLLOW WITHIN MIXED ORGANISMS Performed at [facility], [place] [address], [place], [place] [address]    Report Status PENDING  Incomplete    Impression/Plan:  1. MSSA bacteremia - 1 set of blood cultures positive and source from her legs.  Positive cultures noted.  TTE ordered.  Will send repeat blood cultures.    2. Bilateral leg abscess - extensive infection with necrosis and no options for salvage.  Also with multiple organisms so will change antibiotics to ampicillin/sulbactam to cover what has grown.    3.  DM - poorly controlled with morbid obesity.  She will need much improved efforts at blood sugar control.  Dr. Tommy Medal on tomorrow.

## 2020-09-10 NOTE — Op Note (Signed)
09/10/2020  8:39 AM  PATIENT:  Michelle Skinner    PRE-OPERATIVE DIAGNOSIS:  ULCERS  BILAT FEET  POST-OPERATIVE DIAGNOSIS:  Same  PROCEDURE:  IRRIGATION AND DEBRIDEMENT BILATERAL FEET; Tissue cultures sent.  SURGEON:  [name], MD  PHYSICIAN ASSISTANT:None ANESTHESIA:   General  PREOPERATIVE INDICATIONS:  Michelle Skinner is a  55 y.o. female with a diagnosis of [place] who failed conservative measures and elected for surgical management.    The risks benefits and alternatives were discussed with the patient preoperatively including but not limited to the risks of infection, bleeding, nerve injury, cardiopulmonary complications, the need for revision surgery, among others, and the patient was willing to proceed.  OPERATIVE IMPLANTS: None  @ENCIMAGES @  OPERATIVE FINDINGS: Patient had extensive abscess that extended dorsally to her feet.  Patient does not have foot salvage intervention options.  OPERATIVE PROCEDURE: Patient was brought to the operating room and underwent a general anesthetic.  After adequate levels anesthesia were obtained both lower extremities were prepped using DuraPrep draped into a sterile field a timeout was called.  Attention was first focused on the right foot.  Examination patient had extensive necrotic tissue involving the entire plantar aspect of the right foot.  A 21 blade knife was used to debride the skin and soft tissue back to bleeding viable granulation tissue.  Patient had abscess that extended from the dorsum of the foot to the plantar aspect of the foot.  The wound was 11 x 19 cm.  Due to the extensive nature of the wound and abscess extending dorsally to the foot foot salvage would not be an option for the right foot and the wound was packed open with Kerlix dry dressing and Coban.  Attention was then focused to the left foot.  Patient also had a large necrotic ulcer involving the entire plantar aspect of the left foot.  This extended down to  tendon and bone and muscle.  After excision of the necrotic tissue with a 21 blade knife had an abscess that extended to the entire dorsum of the foot with purulent drainage.  These wounds were irrigated and packed open with Kerlix.  The wound was also 11 x 19 cm.  Patient does not have foot salvage intervention options for the left foot.  Installation wound VAC was not used due to the extensive nature of the abscess tissue.  Debridement type: Excisional Debridement  Side: bilaterally  Body Location: Both feet  Tools used for debridement: scalpel and rongeur  Pre-debridement Wound size (cm):   Length: 10        Width: 10     Depth: 1   Post-debridement Wound size (cm):   Length: 19        Width: 11     Depth: 3   Debridement depth beyond dead/damaged tissue down to healthy viable tissue: no  Tissue layer involved: skin, subcutaneous tissue, muscle / fascia, bone  Nature of tissue removed: Slough, Necrotic, Devitalized Tissue, Non-viable tissue and Purulence  Irrigation volume: 1 liter     Irrigation fluid type: Normal Saline        DISCHARGE PLANNING:  Antibiotic duration: Continue IV antibiotics adjust according to the tissue cultures.  Weightbearing: Nonweightbearing bilateral lower extremities  Pain medication: Opioid pathway  Dressing care/ Wound VAC: Reinforce dressing as needed  Ambulatory devices: Not applicable  Discharge to: Anticipate discharge to skilled nursing.  I will discuss with the patient the need to proceed with bilateral transtibial  amputations.  Follow-up: In the office 1 week post operative.

## 2020-09-10 NOTE — Interval H&P Note (Signed)
History and Physical Interval Note:  09/10/2020 7:41 AM  Michelle Skinner  has presented today for surgery, with the diagnosis of ULCERS  BILAT FEET.  The various methods of treatment have been discussed with the patient and family. After consideration of risks, benefits and other options for treatment, the patient has consented to  Procedure(s) with comments: IRRIGATION AND DEBRIDEMENT FEET; APPLICATION OF WOUND VAC BOTH FEET. (Bilateral) - WOUND VAC X2 USED. as a surgical intervention.  The patient's history has been reviewed, patient examined, no change in status, stable for surgery.  I have reviewed the patient's chart and labs.  Questions were answered to the patient's satisfaction.     [name]

## 2020-09-10 NOTE — Progress Notes (Signed)
  Echocardiogram 2D Echocardiogram has been performed.  Michelle Skinner 09/10/2020, 3:43 PM

## 2020-09-10 NOTE — Progress Notes (Signed)
PROGRESS NOTE    Michelle Skinner  NFA:[id number] DOB: 1966/05/26 DOA: 09/06/2020 PCP: Monico Blitz, MD   Chief Complaint  Patient presents with  . Foot Pain    Brief admission narrative:  55 y.o. female with medical history significant of morbid obesity, DM2 on insulin w/ peripheral neuropathy, HTN, GERD. She reports that she incurred "bone bruises" to both feet several weeks ago which have progressed to open wounds. She had progressive weakness and marked pain in both feet. She presented to [facility].  Pt had initial debridement by Dr. Constance Haw at [facility] and subsequently transferred to [facility] for consult with Dr. Sharol Given.    Assessment & Plan: 1-Uncontrolled type 2 diabetes mellitus with complication, with long-term current use of insulin: Presented with DKA -DKA resolved after IV insulin infusion -increasing lantus to 28 units BID plus SSI, increase prandial coverage to 15 units TID with meals -continue to follow CBG's trend -maintain adequate hydration -follow modified carb diet. CBG (last 3)  Recent Labs    09/09/20 2346 09/10/20 0626 09/10/20 0846  GLUCAP 189* 271* 235*    2-sepsis secondary to bilateral MSSA Bacteremia, cellulitis of both feet and legs -Appreciate ID consult, order TTE, continue IV cefazolin.   -General surgery and orthopedic service consulted (Dr. Sharol Given); will follow recommendations and further inputs as part of wound care/debridement.   Dr. Sharol Given planning took to OR for further debridement 3/6, reports patient likely will require bilateral transtibial amputation and SNF placement.  Consult to TOC.     3-Primary hypothyroidism -Continue Synthroid  4-Mixed hyperlipidemia -Continue statins  5-Essential hypertension, benign -Intermittent soft blood pressures, stable, asymptomatic.  -Continue holding hydralazine -continue adjusted dose of carvedilol -Close monitoring of patient vital signs.  6-acute on chronic renal failure; stage IIIa at  baseline -Continue to minimize nephrotoxic agents, hypotension and the use of contrast -Follow-up renal function trend -Continue IV fluids until adequate hydration by mouth achieved -Cr trending down appropriately and towards baseline.   7-moderate obesity -Body mass index is 50.38 kg/m. -Low calorie diet and portion control discussed with patient.  8-depression -No suicidal ideation hallucination -Continue Prozac and trazodone.  9-gastroesophageal flux disease -Continue PPI.   DVT prophylaxis: Lovenox Code Status: Full code Family Communication: attempted to t/c husband 3/5 Disposition:  Anticipating SNF placement  Status is: Inpatient  Dispo: The patient is from: Home              Anticipated d/c is to: To be determined              Patient currently not medically stable for discharge; receiving treatment for DKA, sepsis and ongoing bilateral cellulitic process and feet wounds requiring IV antibiotics and surgical debridement. Patient will be transfer to [facility] for orthopedic evaluation and further wound care.    Difficult to place patient no   Consultants:   General surgery   Orthopedic service (Dr. Sharol Given)   Procedures:  See below for x-ray reports Incision, drainage and surgical debridement of bilateral feet wounds. 09/08/20   Antimicrobials:  Rocephin and vancomycin  Subjective: Pt tolerated    Objective: Vitals:   09/10/20 0910 09/10/20 0931 09/10/20 1042 09/10/20 1044  BP:  136/63 (!) 147/60 (!) 147/60  Pulse:  69 71 71  Resp: 16 17 18 18   Temp: (!) 97 F (36.1 C) 97.7 F (36.5 C) 98.3 F (36.8 C) 98.3 F (36.8 C)  TempSrc:  Oral Oral Oral  SpO2:   100% 100%  Weight:  Height:        Intake/Output Summary (Last 24 hours) at 09/10/2020 1102 Last data filed at 09/10/2020 0843 Gross per 24 hour  Intake 50 ml  Output 420 ml  Net -370 ml   Filed Weights   09/06/20 1249 09/06/20 2200  Weight: (!) 158.8 kg (!) 148.1 kg    Examination: General  exam: awake, alert, NAD, cooperative.  Respiratory system: no increased work of breathing.  Cardiovascular system:normal s1, s2 sounds distant.   Gastrointestinal system: Abdomen is obese, nondistended, soft and nontender. No organomegaly or masses felt. Normal bowel sounds heard. Central nervous system: Alert and oriented. No focal neurological deficits. Extremities/skin: chronic lymphedema bilateral lower extremities, also with bilateral chronic stasis dermatitis, and ascending erythematous changes. Both feet in bandages clean, dry, intact.  Psychiatry: Judgement and insight appear poor. Mood & affect flat.    Data Reviewed: I have personally reviewed following labs and imaging studies  CBC: Recent Labs  Lab 09/06/20 1401 09/07/20 0622  WBC 25.4* 21.2*  NEUTROABS 22.1*  --   HGB 9.8* 8.8*  HCT 31.6* 27.3*  MCV 91.1 86.9  PLT 402* 710    Basic Metabolic Panel: Recent Labs  Lab 09/06/20 1401 09/07/20 0622 09/07/20 1126  NA 124* 131* 132*  K 3.9 3.4* 3.3*  CL 88* 99 98  CO2 10* 21* 22  GLUCOSE 591* 221* 192*  BUN 52* 52* 51*  CREATININE 1.94* 1.81* 1.79*  CALCIUM 8.8* 7.7* 7.8*    GFR: Estimated Creatinine Clearance: 55.6 mL/min (A) (by C-G formula based on SCr of 1.79 mg/dL (H)).  Liver Function Tests: Recent Labs  Lab 09/06/20 1401  AST 12*  ALT 14  ALKPHOS 120  BILITOT 2.4*  PROT 7.0  ALBUMIN 1.6*    CBG: Recent Labs  Lab 09/09/20 1127 09/09/20 1629 09/09/20 2346 09/10/20 0626 09/10/20 0846  GLUCAP 250* 158* 189* 271* 235*    Recent Results (from the past 240 hour(s))  Resp Panel by RT-PCR (Flu A&B, Covid) Nasopharyngeal Swab     Status: None   Collection Time: 09/06/20  2:01 PM   Specimen: Nasopharyngeal Swab; Nasopharyngeal(NP) swabs in vial transport medium  Result Value Ref Range Status   SARS Coronavirus 2 by RT PCR NEGATIVE NEGATIVE Final    Comment: (NOTE) SARS-CoV-2 target nucleic acids are NOT DETECTED.  The SARS-CoV-2 RNA is  generally detectable in upper respiratory specimens during the acute phase of infection. The lowest concentration of SARS-CoV-2 viral copies this assay can detect is 138 copies/mL. A negative result does not preclude SARS-Cov-2 infection and should not be used as the sole basis for treatment or other patient management decisions. A negative result may occur with  improper specimen collection/handling, submission of specimen other than nasopharyngeal swab, presence of viral mutation(s) within the areas targeted by this assay, and inadequate number of viral copies(<138 copies/mL). A negative result must be combined with clinical observations, patient history, and epidemiological information. The expected result is Negative.  Fact Sheet for Patients:  EntrepreneurPulse.com.au  Fact Sheet for Healthcare Providers:  IncredibleEmployment.be  This test is no t yet approved or cleared by the [place] FDA and  has been authorized for detection and/or diagnosis of SARS-CoV-2 by FDA under an Emergency Use Authorization (EUA). This EUA will remain  in effect (meaning this test can be used) for the duration of the COVID-19 declaration under Section 564(b)(1) of the Act, 21 U.S.C.section 360bbb-3(b)(1), unless the authorization is terminated  or revoked sooner.  Influenza A by PCR NEGATIVE NEGATIVE Final   Influenza B by PCR NEGATIVE NEGATIVE Final    Comment: (NOTE) The Xpert Xpress SARS-CoV-2/FLU/RSV plus assay is intended as an aid in the diagnosis of influenza from Nasopharyngeal swab specimens and should not be used as a sole basis for treatment. Nasal washings and aspirates are unacceptable for Xpert Xpress SARS-CoV-2/FLU/RSV testing.  Fact Sheet for Patients: EntrepreneurPulse.com.au  Fact Sheet for Healthcare Providers: IncredibleEmployment.be  This test is not yet approved or cleared by the [place] FDA and has been authorized for detection and/or diagnosis of SARS-CoV-2 by FDA under an Emergency Use Authorization (EUA). This EUA will remain in effect (meaning this test can be used) for the duration of the COVID-19 declaration under Section 564(b)(1) of the Act, 21 U.S.C. section 360bbb-3(b)(1), unless the authorization is terminated or revoked.  Performed at [facility], [address], [place], [place] [address]   Urine culture     Status: Abnormal   Collection Time: 09/06/20  2:01 PM   Specimen: In/Out Cath Urine  Result Value Ref Range Status   Specimen Description   Final    IN/OUT CATH URINE Performed at [facility], [address] [place], [place] [address]    Special Requests   Final    NONE Performed at [facility], [address], [place], [place] [address]    Culture (A)  Final    1,000 COLONIES/mL PROTEUS MIRABILIS 3,000 COLONIES/mL KLEBSIELLA PNEUMONIAE    Report Status 09/09/2020 FINAL  Final   Organism ID, Bacteria PROTEUS MIRABILIS (A)  Final   Organism ID, Bacteria KLEBSIELLA PNEUMONIAE (A)  Final      Susceptibility   Klebsiella pneumoniae - MIC*    AMPICILLIN RESISTANT Resistant     CEFAZOLIN <=4 SENSITIVE Sensitive     CEFEPIME <=0.12 SENSITIVE Sensitive     CEFTRIAXONE <=0.25 SENSITIVE Sensitive     CIPROFLOXACIN <=0.25 SENSITIVE Sensitive     GENTAMICIN <=1 SENSITIVE Sensitive     IMIPENEM <=0.25 SENSITIVE Sensitive     NITROFURANTOIN 32 SENSITIVE Sensitive     TRIMETH/SULFA <=20 SENSITIVE Sensitive     AMPICILLIN/SULBACTAM <=2 SENSITIVE Sensitive     PIP/TAZO <=4 SENSITIVE Sensitive     * 3,000 COLONIES/mL KLEBSIELLA PNEUMONIAE   Proteus mirabilis - MIC*    AMPICILLIN <=2 SENSITIVE Sensitive     CEFAZOLIN 8 SENSITIVE Sensitive     CEFEPIME <=0.12 SENSITIVE Sensitive     CEFTRIAXONE <=0.25 SENSITIVE Sensitive     CIPROFLOXACIN >=4 RESISTANT Resistant     GENTAMICIN <=1 SENSITIVE Sensitive     IMIPENEM 2 SENSITIVE Sensitive      NITROFURANTOIN 128 RESISTANT Resistant     TRIMETH/SULFA >=320 RESISTANT Resistant     AMPICILLIN/SULBACTAM <=2 SENSITIVE Sensitive     PIP/TAZO <=4 SENSITIVE Sensitive     * 1,000 COLONIES/mL PROTEUS MIRABILIS  Blood Culture (routine x 2)     Status: Abnormal   Collection Time: 09/06/20  2:14 PM   Specimen: BLOOD  Result Value Ref Range Status   Specimen Description   Final    BLOOD LEFT ANTECUBITAL Performed at [facility], [address] [place], [place] [address]    Special Requests   Final    BOTTLES DRAWN AEROBIC AND ANAEROBIC Blood Culture adequate volume Performed at [facility], [address] [place], [place] [address]    Culture  Setup Time   Final    GRAM POSITIVE COCCI IN BOTH AEROBIC AND ANAEROBIC BOTTLES Gram Stain  Report Called to,Read Back By and Verified With: LARIMORE,A@[email or web address]  BY MATTHEWS, B 3.3.2022 Performed at [facility] ID to follow CRITICAL RESULT CALLED TO, READ BACK BY AND VERIFIED WITHMartin Majestic RN [id number] 09/07/20 A BROWNING Performed at [facility], [place] [address] [place], [place] [address]    Culture STAPHYLOCOCCUS AUREUS (A)  Final   Report Status 09/09/2020 FINAL  Final   Organism ID, Bacteria STAPHYLOCOCCUS AUREUS  Final      Susceptibility   Staphylococcus aureus - MIC*    CIPROFLOXACIN <=0.5 SENSITIVE Sensitive     ERYTHROMYCIN <=0.25 SENSITIVE Sensitive     GENTAMICIN <=0.5 SENSITIVE Sensitive     OXACILLIN <=0.25 SENSITIVE Sensitive     TETRACYCLINE <=1 SENSITIVE Sensitive     VANCOMYCIN <=0.5 SENSITIVE Sensitive     TRIMETH/SULFA <=10 SENSITIVE Sensitive     CLINDAMYCIN <=0.25 SENSITIVE Sensitive     RIFAMPIN <=0.5 SENSITIVE Sensitive     Inducible Clindamycin NEGATIVE Sensitive     * STAPHYLOCOCCUS AUREUS  Blood Culture ID Panel (Reflexed)     Status: Abnormal   Collection Time: 09/06/20  2:14 PM  Result Value Ref Range Status   Enterococcus faecalis NOT DETECTED NOT DETECTED Final   Enterococcus Faecium NOT  DETECTED NOT DETECTED Final   Listeria monocytogenes NOT DETECTED NOT DETECTED Final   Staphylococcus species DETECTED (A) NOT DETECTED Final    Comment: CRITICAL RESULT CALLED TO, READ BACK BY AND VERIFIED WITH: Martin Majestic RN [id number] 09/07/20 A BROWNING    Staphylococcus aureus (BCID) DETECTED (A) NOT DETECTED Final    Comment: CRITICAL RESULT CALLED TO, READ BACK BY AND VERIFIED WITH: Martin Majestic RN [phone] 09/07/20 A BROWNING    Staphylococcus epidermidis NOT DETECTED NOT DETECTED Final   Staphylococcus lugdunensis NOT DETECTED NOT DETECTED Final   Streptococcus species NOT DETECTED NOT DETECTED Final   Streptococcus agalactiae NOT DETECTED NOT DETECTED Final   Streptococcus pneumoniae NOT DETECTED NOT DETECTED Final   Streptococcus pyogenes NOT DETECTED NOT DETECTED Final   A.calcoaceticus-baumannii NOT DETECTED NOT DETECTED Final   Bacteroides fragilis NOT DETECTED NOT DETECTED Final   Enterobacterales NOT DETECTED NOT DETECTED Final   Enterobacter cloacae complex NOT DETECTED NOT DETECTED Final   Escherichia coli NOT DETECTED NOT DETECTED Final   Klebsiella aerogenes NOT DETECTED NOT DETECTED Final   Klebsiella oxytoca NOT DETECTED NOT DETECTED Final   Klebsiella pneumoniae NOT DETECTED NOT DETECTED Final   Proteus species NOT DETECTED NOT DETECTED Final   Salmonella species NOT DETECTED NOT DETECTED Final   Serratia marcescens NOT DETECTED NOT DETECTED Final   Haemophilus influenzae NOT DETECTED NOT DETECTED Final   Neisseria meningitidis NOT DETECTED NOT DETECTED Final   Pseudomonas aeruginosa NOT DETECTED NOT DETECTED Final   Stenotrophomonas maltophilia NOT DETECTED NOT DETECTED Final   Candida albicans NOT DETECTED NOT DETECTED Final   Candida auris NOT DETECTED NOT DETECTED Final   Candida glabrata NOT DETECTED NOT DETECTED Final   Candida krusei NOT DETECTED NOT DETECTED Final   Candida parapsilosis NOT DETECTED NOT DETECTED Final   Candida tropicalis NOT DETECTED NOT DETECTED Final    Cryptococcus neoformans/gattii NOT DETECTED NOT DETECTED Final   Meth resistant mecA/C and MREJ NOT DETECTED NOT DETECTED Final    Comment: Performed at [facility], [address] [place], [place] [address]  Blood Culture (routine x 2)     Status: None (Preliminary result)   Collection Time: 09/06/20  3:59 PM   Specimen: BLOOD  RIGHT HAND  Result Value Ref Range Status   Specimen Description BLOOD RIGHT HAND  Final   Special Requests   Final    BOTTLES DRAWN AEROBIC AND ANAEROBIC Blood Culture adequate volume   Culture   Final    NO GROWTH 4 DAYS Performed at [facility], [address], [place], [place] [address]    Report Status PENDING  Incomplete  MRSA PCR Screening     Status: None   Collection Time: 09/06/20  9:46 PM   Specimen: Nasal Mucosa; Nasopharyngeal  Result Value Ref Range Status   MRSA by PCR NEGATIVE NEGATIVE Final    Comment:        The GeneXpert MRSA Assay (FDA approved for NASAL specimens only), is one component of a comprehensive MRSA colonization surveillance program. It is not intended to diagnose MRSA infection nor to guide or monitor treatment for MRSA infections. Performed at [facility], [address] [place], [place] [address]   Aerobic/Anaerobic Culture w Gram Stain (surgical/deep wound)     Status: None (Preliminary result)   Collection Time: 09/08/20 11:04 AM   Specimen: PATH Other; Tissue  Result Value Ref Range Status   Specimen Description   Final    FOOT RIGHT Performed at [facility], [address], [place], [place] [address]    Special Requests   Final    NONE Performed at [facility], [address], [place], [place] [address]    Gram Stain   Final    RARE WBC PRESENT, PREDOMINANTLY PMN MODERATE GRAM POSITIVE COCCI IN PAIRS IN CLUSTERS    Culture   Final    ABUNDANT STAPHYLOCOCCUS AUREUS CULTURE REINCUBATED FOR BETTER GROWTH Performed at [facility], [place] [address] [place], [place] [address]    Report  Status PENDING  Incomplete  Aerobic/Anaerobic Culture w Gram Stain (surgical/deep wound)     Status: None (Preliminary result)   Collection Time: 09/08/20 11:45 AM   Specimen: PATH Other; Tissue  Result Value Ref Range Status   Specimen Description   Final    FOOT RIGHT Performed at [facility], [address] [place], [place] [address]    Special Requests   Final    NONE Performed at [facility], [address], [place], [place] [address]    Gram Stain   Final    FEW WBC PRESENT, PREDOMINANTLY PMN MODERATE GRAM POSITIVE COCCI IN PAIRS IN CLUSTERS RARE GRAM NEGATIVE RODS    Culture   Final    CULTURE REINCUBATED FOR BETTER GROWTH Performed at [facility], [place] [address] [place], [place] [address]    Report Status PENDING  Incomplete  Aerobic/Anaerobic Culture w Gram Stain (surgical/deep wound)     Status: None (Preliminary result)   Collection Time: 09/08/20 11:46 AM   Specimen: PATH Other; Tissue  Result Value Ref Range Status   Specimen Description   Final    FOOT LEFT Performed at [facility], [address] [place] [address], [place], [place] [address]    Special Requests   Final    NONE Performed at [facility], [address] [place], [place] [address]    Gram Stain   Final    RARE WBC PRESENT, PREDOMINANTLY PMN ABUNDANT GRAM POSITIVE COCCI IN CLUSTERS Performed at [facility], [place] [address] [place] [address] [place], [place] [address]    Culture ABUNDANT ENTEROCOCCUS FAECALIS  Final   Report Status PENDING  Incomplete     Radiology Studies: No results found.  Scheduled Meds: .  atorvastatin  40 mg Oral QHS  . carvedilol  12.5 mg Oral BID WC  . Chlorhexidine Gluconate Cloth  6 each Topical Daily  . cholecalciferol  5,000 Units Oral Daily  . docusate sodium  100 mg Oral BID  . enoxaparin (LOVENOX) injection  70 mg Subcutaneous Q24H  . FLUoxetine  40 mg Oral Daily  . insulin aspart  0-20 Units Subcutaneous TID WC  . insulin aspart  15 Units Subcutaneous TID WC  . insulin  glargine  28 Units Subcutaneous BID  . levothyroxine  150 mcg Oral QAC breakfast  . magnesium oxide  200 mg Oral Daily  . pantoprazole  40 mg Oral Daily  . senna  1 tablet Oral BID  . sodium hypochlorite   Irrigation BID   Continuous Infusions: . sodium chloride 250 mL (09/09/20 2256)  . sodium chloride 75 mL/hr at 09/09/20 0657  . sodium chloride    .  ceFAZolin (ANCEF) IV 2 g (09/10/20 0551)  . methocarbamol (ROBAXIN) IV       LOS: 4 days   Time spent: 35 minutes  Clanford Wynetta Emery, MD How to contact the [facility] Attending or Consulting provider [place] or covering provider during after hours [place], for this patient?  1. Check the care team in [facility] and look for a) attending/consulting [facility] provider listed and b) the [facility] team listed 2. Log into [email or web address] and use [place]'s universal password to access. If you do not have the password, please contact the hospital operator. 3. Locate the [facility] provider you are looking for under [facility] and page to a number that you can be directly reached. 4. If you still have difficulty reaching the provider, please page the [facility] (Director on Call) for the Hospitalists listed on amion for assistance.   09/10/2020, 11:02 AM

## 2020-09-11 ENCOUNTER — Encounter (HOSPITAL_COMMUNITY): Payer: Self-pay | Admitting: Orthopedic Surgery

## 2020-09-11 ENCOUNTER — Other Ambulatory Visit: Payer: Self-pay | Admitting: Physician Assistant

## 2020-09-11 DIAGNOSIS — M86072 Acute hematogenous osteomyelitis, left ankle and foot: Secondary | ICD-10-CM

## 2020-09-11 DIAGNOSIS — B9561 Methicillin susceptible Staphylococcus aureus infection as the cause of diseases classified elsewhere: Secondary | ICD-10-CM

## 2020-09-11 DIAGNOSIS — M86071 Acute hematogenous osteomyelitis, right ankle and foot: Secondary | ICD-10-CM

## 2020-09-11 DIAGNOSIS — Z6841 Body Mass Index (BMI) 40.0 and over, adult: Secondary | ICD-10-CM | POA: Diagnosis not present

## 2020-09-11 DIAGNOSIS — L97523 Non-pressure chronic ulcer of other part of left foot with necrosis of muscle: Secondary | ICD-10-CM | POA: Diagnosis not present

## 2020-09-11 DIAGNOSIS — L97513 Non-pressure chronic ulcer of other part of right foot with necrosis of muscle: Secondary | ICD-10-CM | POA: Diagnosis not present

## 2020-09-11 DIAGNOSIS — I10 Essential (primary) hypertension: Secondary | ICD-10-CM | POA: Diagnosis not present

## 2020-09-11 DIAGNOSIS — R7881 Bacteremia: Secondary | ICD-10-CM

## 2020-09-11 DIAGNOSIS — L03119 Cellulitis of unspecified part of limb: Secondary | ICD-10-CM | POA: Diagnosis not present

## 2020-09-11 LAB — COMPREHENSIVE METABOLIC PANEL
ALT: 5 U/L (ref 0–44)
AST: 12 U/L — ABNORMAL LOW (ref 15–41)
Albumin: <1 g/dL — ABNORMAL LOW (ref 3.5–5.0)
Alkaline Phosphatase: 99 U/L (ref 38–126)
Anion gap: 11 (ref 5–15)
BUN: 36 mg/dL — ABNORMAL HIGH (ref 6–20)
CO2: 18 mmol/L — ABNORMAL LOW (ref 22–32)
Calcium: 7.5 mg/dL — ABNORMAL LOW (ref 8.9–10.3)
Chloride: 103 mmol/L (ref 98–111)
Creatinine, Ser: 1.47 mg/dL — ABNORMAL HIGH (ref 0.44–1.00)
GFR, Estimated: 42 mL/min — ABNORMAL LOW (ref >60)
Glucose, Bld: 129 mg/dL — ABNORMAL HIGH (ref 70–99)
Potassium: 3.5 mmol/L (ref 3.5–5.1)
Sodium: 132 mmol/L — ABNORMAL LOW (ref 135–145)
Total Bilirubin: 0.7 mg/dL (ref 0.3–1.2)
Total Protein: 5.8 g/dL — ABNORMAL LOW (ref 6.5–8.1)

## 2020-09-11 LAB — CBC WITH DIFFERENTIAL/PLATELET
Abs Immature Granulocytes: 0.77 10*3/uL — ABNORMAL HIGH (ref 0.00–0.07)
Basophils Absolute: 0.1 10*3/uL (ref 0.0–0.1)
Basophils Relative: 0 %
Eosinophils Absolute: 0.1 10*3/uL (ref 0.0–0.5)
Eosinophils Relative: 0 %
HCT: 23.7 % — ABNORMAL LOW (ref 36.0–46.0)
Hemoglobin: 7.5 g/dL — ABNORMAL LOW (ref 12.0–15.0)
Immature Granulocytes: 5 %
Lymphocytes Relative: 9 %
Lymphs Abs: 1.4 10*3/uL (ref 0.7–4.0)
MCH: 27.7 pg (ref 26.0–34.0)
MCHC: 31.6 g/dL (ref 30.0–36.0)
MCV: 87.5 fL (ref 80.0–100.0)
Monocytes Absolute: 0.8 10*3/uL (ref 0.1–1.0)
Monocytes Relative: 5 %
Neutro Abs: 12.9 10*3/uL — ABNORMAL HIGH (ref 1.7–7.7)
Neutrophils Relative %: 81 %
Platelets: 289 10*3/uL (ref 150–400)
RBC: 2.71 MIL/uL — ABNORMAL LOW (ref 3.87–5.11)
RDW: 17.4 % — ABNORMAL HIGH (ref 11.5–15.5)
WBC: 15.9 10*3/uL — ABNORMAL HIGH (ref 4.0–10.5)
nRBC: 0.1 % (ref 0.0–0.2)

## 2020-09-11 LAB — CULTURE, BLOOD (ROUTINE X 2)
Culture: NO GROWTH
Special Requests: ADEQUATE

## 2020-09-11 LAB — GLUCOSE, CAPILLARY
Glucose-Capillary: 179 mg/dL — ABNORMAL HIGH (ref 70–99)
Glucose-Capillary: 121 mg/dL — ABNORMAL HIGH (ref 70–99)
Glucose-Capillary: 129 mg/dL — ABNORMAL HIGH (ref 70–99)
Glucose-Capillary: 117 mg/dL — ABNORMAL HIGH (ref 70–99)
Glucose-Capillary: 95 mg/dL (ref 70–99)
Glucose-Capillary: 196 mg/dL — ABNORMAL HIGH (ref 70–99)

## 2020-09-11 LAB — MAGNESIUM: Magnesium: 2 mg/dL (ref 1.7–2.4)

## 2020-09-11 MED ORDER — METOPROLOL SUCCINATE ER 50 MG PO TB24
100.0000 mg | ORAL_TABLET | Freq: Every day | ORAL | Status: DC
  Administered 2020-09-11 – 2020-09-14 (×4): 100 mg via ORAL
  Filled 2020-09-11 (×4): qty 2

## 2020-09-11 NOTE — Progress Notes (Signed)
Patient came from [facility]. Patient had rings on prior to going to OR at [facility], but had to be removed. The rings were locked up with security. Nurse Secretary from [facility] ICU called Shanon Brow, patient's husband to notify him of the rings with security. Shanon Brow stated he will pick 4 rings up from [employer] on 09/12/20. Security is aware.

## 2020-09-11 NOTE — Progress Notes (Signed)
[facility] for Infectious Disease  Date of Admission:  09/06/2020     Total days of antibiotics 6         ASSESSMENT:  Michelle Skinner is POD #1 from irrigation and debridement of bilateral feet in the setting of MSSA bacteremia. TTE without evidence of endocarditis with blood cultures from today pending. Surgical specimens growing MSSA, Enterococcus faecalis, and 1 with gram negative rods. No foot salvage interventions remain with recommendation for bilateral below knee amputations. Continue current dose of ampicillin/sulbactam. Wound care per orthopedics and diabetes management per primary team. Will need antibiotics post-amputation given MSSA bacteremia.   PLAN:  1. Continue ampicillin/sulbactam.  2. Monitor culture results and narrow adjust antibiotics as indicated.  3. Scheduled for bilateral below knee amputations on Wednesday. 4. Wound care per orthopedics. 5. Diabetes management per primary team.   Principal Problem:   Plantar ulcer of right foot ([place]) Active Problems:   Uncontrolled type 2 diabetes mellitus with complication, with long-term current use of insulin (HCC)   Morbid (severe) obesity due to excess calories ([place])   Primary hypothyroidism   Mixed hyperlipidemia   Essential hypertension, benign   Depression   GERD (gastroesophageal reflux disease)   Cellulitis   Sepsis ([place])   Plantar ulcer of left foot ([place])   Lymphedema   Severe protein-calorie malnutrition (HCC)   BMI 50.0-59.9, adult ([place])   . atorvastatin  40 mg Oral QHS  . Chlorhexidine Gluconate Cloth  6 each Topical Daily  . cholecalciferol  5,000 Units Oral Daily  . docusate sodium  100 mg Oral BID  . enoxaparin (LOVENOX) injection  70 mg Subcutaneous Q24H  . FLUoxetine  40 mg Oral Daily  . insulin aspart  0-20 Units Subcutaneous TID WC  . insulin aspart  15 Units Subcutaneous TID WC  . insulin glargine  28 Units Subcutaneous BID  . levothyroxine  150 mcg Oral QAC breakfast  . magnesium oxide   200 mg Oral Daily  . metoprolol succinate  100 mg Oral QHS  . pantoprazole  40 mg Oral Daily  . senna  1 tablet Oral BID  . sodium hypochlorite   Irrigation BID    SUBJECTIVE:  Afebrile overnight with no acute events. Concerned about need for additional surgery and recommended amputations.   Allergies  Allergen Reactions  . Sulfa Antibiotics Diarrhea, Nausea And Vomiting and Rash  . Clindamycin/Lincomycin Diarrhea and Nausea And Vomiting  . Invokana [Canagliflozin] Diarrhea and Nausea And Vomiting  . Latex     Rash, itching, over a long period of time  . Tape Itching and Swelling    Reaction after a few days use/ please use paper tape  . Bactrim [Sulfamethoxazole-Trimethoprim] Diarrhea, Nausea And Vomiting and Rash  . Cherry Itching, Swelling and Rash    Throat swelling  . Gabapentin Diarrhea, Nausea And Vomiting and Rash  . Other Itching, Swelling and Rash    Reaction to Hot peppers (throat swelling)     Review of Systems: Review of Systems  Constitutional: Negative for chills, fever and weight loss.  Respiratory: Negative for cough, shortness of breath and wheezing.   Cardiovascular: Negative for chest pain and leg swelling.  Gastrointestinal: Negative for abdominal pain, constipation, diarrhea, nausea and vomiting.  Skin: Negative for rash.      OBJECTIVE: Vitals:   09/10/20 1925 09/11/20 0025 09/11/20 0500 09/11/20 0726  BP: 139/66 136/63 132/74 (!) 148/67  Pulse: 71 68 74 69  Resp: 19 17 19 17   Temp:  98.9 F (37.2 C) 98 F (36.7 C) 98.2 F (36.8 C) (!) 97.4 F (36.3 C)  TempSrc: Oral Oral Oral Oral  SpO2: 100% 97% 98% 100%  Weight:      Height:       Body mass index is 50.38 kg/m.  Physical Exam Constitutional:      General: She is not in acute distress.    Appearance: She is well-developed and well-nourished.  Cardiovascular:     Rate and Rhythm: Normal rate and regular rhythm.     Pulses: Intact distal pulses.     Heart sounds: Normal heart  sounds.  Pulmonary:     Effort: Pulmonary effort is normal.     Breath sounds: Normal breath sounds.  Musculoskeletal:     Right lower leg: Edema present.     Left lower leg: Edema present.     Comments: Surgical dressings in place.   Skin:    General: Skin is warm and dry.  Neurological:     Mental Status: She is alert.  Psychiatric:        Mood and Affect: Mood and affect normal.     Lab Results Lab Results  Component Value Date   WBC 15.9 (H) 09/11/2020   HGB 7.5 (L) 09/11/2020   HCT 23.7 (L) 09/11/2020   MCV 87.5 09/11/2020   PLT 289 09/11/2020    Lab Results  Component Value Date   CREATININE 1.47 (H) 09/11/2020   BUN 36 (H) 09/11/2020   NA 132 (L) 09/11/2020   K 3.5 09/11/2020   CL 103 09/11/2020   CO2 18 (L) 09/11/2020    Lab Results  Component Value Date   ALT 5 09/11/2020   AST 12 (L) 09/11/2020   ALKPHOS 99 09/11/2020   BILITOT 0.7 09/11/2020     Microbiology: Recent Results (from the past 240 hour(s))  Resp Panel by RT-PCR (Flu A&B, Covid) Nasopharyngeal Swab     Status: None   Collection Time: 09/06/20  2:01 PM   Specimen: Nasopharyngeal Swab; Nasopharyngeal(NP) swabs in vial transport medium  Result Value Ref Range Status   SARS Coronavirus 2 by RT PCR NEGATIVE NEGATIVE Final    Comment: (NOTE) SARS-CoV-2 target nucleic acids are NOT DETECTED.  The SARS-CoV-2 RNA is generally detectable in upper respiratory specimens during the acute phase of infection. The lowest concentration of SARS-CoV-2 viral copies this assay can detect is 138 copies/mL. A negative result does not preclude SARS-Cov-2 infection and should not be used as the sole basis for treatment or other patient management decisions. A negative result may occur with  improper specimen collection/handling, submission of specimen other than nasopharyngeal swab, presence of viral mutation(s) within the areas targeted by this assay, and inadequate number of viral copies(<138 copies/mL). A  negative result must be combined with clinical observations, patient history, and epidemiological information. The expected result is Negative.  Fact Sheet for Patients:  EntrepreneurPulse.com.au  Fact Sheet for Healthcare Providers:  IncredibleEmployment.be  This test is no t yet approved or cleared by the [place] FDA and  has been authorized for detection and/or diagnosis of SARS-CoV-2 by FDA under an Emergency Use Authorization (EUA). This EUA will remain  in effect (meaning this test can be used) for the duration of the COVID-19 declaration under Section 564(b)(1) of the Act, 21 U.S.C.section 360bbb-3(b)(1), unless the authorization is terminated  or revoked sooner.       Influenza A by PCR NEGATIVE NEGATIVE Final   Influenza B by PCR NEGATIVE  NEGATIVE Final    Comment: (NOTE) The Xpert Xpress SARS-CoV-2/FLU/RSV plus assay is intended as an aid in the diagnosis of influenza from Nasopharyngeal swab specimens and should not be used as a sole basis for treatment. Nasal washings and aspirates are unacceptable for Xpert Xpress SARS-CoV-2/FLU/RSV testing.  Fact Sheet for Patients: EntrepreneurPulse.com.au  Fact Sheet for Healthcare Providers: IncredibleEmployment.be  This test is not yet approved or cleared by the [place] FDA and has been authorized for detection and/or diagnosis of SARS-CoV-2 by FDA under an Emergency Use Authorization (EUA). This EUA will remain in effect (meaning this test can be used) for the duration of the COVID-19 declaration under Section 564(b)(1) of the Act, 21 U.S.C. section 360bbb-3(b)(1), unless the authorization is terminated or revoked.  Performed at [facility], [address] [place], [place] [address]   Urine culture     Status: Abnormal   Collection Time: 09/06/20  2:01 PM   Specimen: In/Out Cath Urine  Result Value Ref Range Status   Specimen  Description   Final    IN/OUT CATH URINE Performed at [facility], [address] [place], [place] [address]    Special Requests   Final    NONE Performed at [facility], [address] [place], [place] [address]    Culture (A)  Final    1,000 COLONIES/mL PROTEUS MIRABILIS 3,000 COLONIES/mL KLEBSIELLA PNEUMONIAE    Report Status 09/09/2020 FINAL  Final   Organism ID, Bacteria PROTEUS MIRABILIS (A)  Final   Organism ID, Bacteria KLEBSIELLA PNEUMONIAE (A)  Final      Susceptibility   Klebsiella pneumoniae - MIC*    AMPICILLIN RESISTANT Resistant     CEFAZOLIN <=4 SENSITIVE Sensitive     CEFEPIME <=0.12 SENSITIVE Sensitive     CEFTRIAXONE <=0.25 SENSITIVE Sensitive     CIPROFLOXACIN <=0.25 SENSITIVE Sensitive     GENTAMICIN <=1 SENSITIVE Sensitive     IMIPENEM <=0.25 SENSITIVE Sensitive     NITROFURANTOIN 32 SENSITIVE Sensitive     TRIMETH/SULFA <=20 SENSITIVE Sensitive     AMPICILLIN/SULBACTAM <=2 SENSITIVE Sensitive     PIP/TAZO <=4 SENSITIVE Sensitive     * 3,000 COLONIES/mL KLEBSIELLA PNEUMONIAE   Proteus mirabilis - MIC*    AMPICILLIN <=2 SENSITIVE Sensitive     CEFAZOLIN 8 SENSITIVE Sensitive     CEFEPIME <=0.12 SENSITIVE Sensitive     CEFTRIAXONE <=0.25 SENSITIVE Sensitive     CIPROFLOXACIN >=4 RESISTANT Resistant     GENTAMICIN <=1 SENSITIVE Sensitive     IMIPENEM 2 SENSITIVE Sensitive     NITROFURANTOIN 128 RESISTANT Resistant     TRIMETH/SULFA >=320 RESISTANT Resistant     AMPICILLIN/SULBACTAM <=2 SENSITIVE Sensitive     PIP/TAZO <=4 SENSITIVE Sensitive     * 1,000 COLONIES/mL PROTEUS MIRABILIS  Blood Culture (routine x 2)     Status: Abnormal   Collection Time: 09/06/20  2:14 PM   Specimen: BLOOD  Result Value Ref Range Status   Specimen Description   Final    BLOOD LEFT ANTECUBITAL Performed at [facility], [address] [place], [place] [address]    Special Requests   Final    BOTTLES DRAWN AEROBIC AND ANAEROBIC Blood Culture adequate volume Performed  at [facility], [address] [place], [place] [address]    Culture  Setup Time   Final    GRAM POSITIVE COCCI IN BOTH AEROBIC AND ANAEROBIC BOTTLES Gram Stain Report Called to,Read Back By and Verified With: LARIMORE,[email or web address]  BY MATTHEWS, B 3.3.2022 Performed  at [place] ID to follow CRITICAL RESULT CALLED TO, READ BACK BY AND VERIFIED WITH: Martin Majestic RN [id number] 09/07/20 A BROWNING Performed at [facility], [place] [address] [place], [place] [address]    Culture STAPHYLOCOCCUS AUREUS (A)  Final   Report Status 09/09/2020 FINAL  Final   Organism ID, Bacteria STAPHYLOCOCCUS AUREUS  Final      Susceptibility   Staphylococcus aureus - MIC*    CIPROFLOXACIN <=0.5 SENSITIVE Sensitive     ERYTHROMYCIN <=0.25 SENSITIVE Sensitive     GENTAMICIN <=0.5 SENSITIVE Sensitive     OXACILLIN <=0.25 SENSITIVE Sensitive     TETRACYCLINE <=1 SENSITIVE Sensitive     VANCOMYCIN <=0.5 SENSITIVE Sensitive     TRIMETH/SULFA <=10 SENSITIVE Sensitive     CLINDAMYCIN <=0.25 SENSITIVE Sensitive     RIFAMPIN <=0.5 SENSITIVE Sensitive     Inducible Clindamycin NEGATIVE Sensitive     * STAPHYLOCOCCUS AUREUS  Blood Culture ID Panel (Reflexed)     Status: Abnormal   Collection Time: 09/06/20  2:14 PM  Result Value Ref Range Status   Enterococcus faecalis NOT DETECTED NOT DETECTED Final   Enterococcus Faecium NOT DETECTED NOT DETECTED Final   Listeria monocytogenes NOT DETECTED NOT DETECTED Final   Staphylococcus species DETECTED (A) NOT DETECTED Final    Comment: CRITICAL RESULT CALLED TO, READ BACK BY AND VERIFIED WITH: Martin Majestic RN [id number] 09/07/20 A BROWNING    Staphylococcus aureus (BCID) DETECTED (A) NOT DETECTED Final    Comment: CRITICAL RESULT CALLED TO, READ BACK BY AND VERIFIED WITH: Martin Majestic RN [phone] 09/07/20 A BROWNING    Staphylococcus epidermidis NOT DETECTED NOT DETECTED Final   Staphylococcus lugdunensis NOT DETECTED NOT DETECTED Final   Streptococcus species NOT DETECTED NOT  DETECTED Final   Streptococcus agalactiae NOT DETECTED NOT DETECTED Final   Streptococcus pneumoniae NOT DETECTED NOT DETECTED Final   Streptococcus pyogenes NOT DETECTED NOT DETECTED Final   A.calcoaceticus-baumannii NOT DETECTED NOT DETECTED Final   Bacteroides fragilis NOT DETECTED NOT DETECTED Final   Enterobacterales NOT DETECTED NOT DETECTED Final   Enterobacter cloacae complex NOT DETECTED NOT DETECTED Final   Escherichia coli NOT DETECTED NOT DETECTED Final   Klebsiella aerogenes NOT DETECTED NOT DETECTED Final   Klebsiella oxytoca NOT DETECTED NOT DETECTED Final   Klebsiella pneumoniae NOT DETECTED NOT DETECTED Final   Proteus species NOT DETECTED NOT DETECTED Final   Salmonella species NOT DETECTED NOT DETECTED Final   Serratia marcescens NOT DETECTED NOT DETECTED Final   Haemophilus influenzae NOT DETECTED NOT DETECTED Final   Neisseria meningitidis NOT DETECTED NOT DETECTED Final   Pseudomonas aeruginosa NOT DETECTED NOT DETECTED Final   Stenotrophomonas maltophilia NOT DETECTED NOT DETECTED Final   Candida albicans NOT DETECTED NOT DETECTED Final   Candida auris NOT DETECTED NOT DETECTED Final   Candida glabrata NOT DETECTED NOT DETECTED Final   Candida krusei NOT DETECTED NOT DETECTED Final   Candida parapsilosis NOT DETECTED NOT DETECTED Final   Candida tropicalis NOT DETECTED NOT DETECTED Final   Cryptococcus neoformans/gattii NOT DETECTED NOT DETECTED Final   Meth resistant mecA/C and MREJ NOT DETECTED NOT DETECTED Final    Comment: Performed at [facility], [address], [place], [place] [address]  Blood Culture (routine x 2)     Status: None   Collection Time: 09/06/20  3:59 PM   Specimen: BLOOD RIGHT HAND  Result Value Ref Range Status   Specimen Description BLOOD RIGHT HAND  Final  Special Requests   Final    BOTTLES DRAWN AEROBIC AND ANAEROBIC Blood Culture adequate volume   Culture   Final    NO GROWTH 5 DAYS Performed at [facility], [address], [place], [place] [address]    Report Status 09/11/2020 FINAL  Final  MRSA PCR Screening     Status: None   Collection Time: 09/06/20  9:46 PM   Specimen: Nasal Mucosa; Nasopharyngeal  Result Value Ref Range Status   MRSA by PCR NEGATIVE NEGATIVE Final    Comment:        The GeneXpert MRSA Assay (FDA approved for NASAL specimens only), is one component of a comprehensive MRSA colonization surveillance program. It is not intended to diagnose MRSA infection nor to guide or monitor treatment for MRSA infections. Performed at [facility], [address], [place], [place] [address]   Aerobic/Anaerobic Culture w Gram Stain (surgical/deep wound)     Status: None (Preliminary result)   Collection Time: 09/08/20 11:04 AM   Specimen: PATH Other; Tissue  Result Value Ref Range Status   Specimen Description   Final    FOOT RIGHT Performed at [facility], [address], [place], [place] [address]    Special Requests   Final    NONE Performed at [facility], [address], [place], [place] [address]    Gram Stain   Final    RARE WBC PRESENT, PREDOMINANTLY PMN MODERATE GRAM POSITIVE COCCI IN PAIRS IN CLUSTERS Performed at [facility], [place] [address] [place], [place] [address]    Culture   Final    ABUNDANT STAPHYLOCOCCUS AUREUS MODERATE ENTEROCOCCUS FAECALIS NO ANAEROBES ISOLATED; CULTURE IN PROGRESS FOR 5 DAYS    Report Status PENDING  Incomplete   Organism ID, Bacteria STAPHYLOCOCCUS AUREUS  Final   Organism ID, Bacteria ENTEROCOCCUS FAECALIS  Final      Susceptibility   Enterococcus faecalis - MIC*    AMPICILLIN <=2 SENSITIVE Sensitive     VANCOMYCIN 1 SENSITIVE Sensitive     GENTAMICIN SYNERGY SENSITIVE Sensitive     * MODERATE ENTEROCOCCUS FAECALIS   Staphylococcus aureus - MIC*    CIPROFLOXACIN <=0.5 SENSITIVE Sensitive     ERYTHROMYCIN <=0.25 SENSITIVE Sensitive     GENTAMICIN <=0.5 SENSITIVE Sensitive     OXACILLIN 0.5 SENSITIVE Sensitive     TETRACYCLINE  <=1 SENSITIVE Sensitive     VANCOMYCIN <=0.5 SENSITIVE Sensitive     TRIMETH/SULFA <=10 SENSITIVE Sensitive     CLINDAMYCIN <=0.25 SENSITIVE Sensitive     RIFAMPIN <=0.5 SENSITIVE Sensitive     Inducible Clindamycin NEGATIVE Sensitive     * ABUNDANT STAPHYLOCOCCUS AUREUS  Aerobic/Anaerobic Culture w Gram Stain (surgical/deep wound)     Status: None (Preliminary result)   Collection Time: 09/08/20 11:45 AM   Specimen: PATH Other; Tissue  Result Value Ref Range Status   Specimen Description FOOT RIGHT  Final   Special Requests NONE  Final   Gram Stain   Final    FEW WBC PRESENT, PREDOMINANTLY PMN MODERATE GRAM POSITIVE COCCI IN PAIRS IN CLUSTERS RARE GRAM NEGATIVE RODS    Culture   Final    MODERATE STAPHYLOCOCCUS AUREUS NO ANAEROBES ISOLATED; CULTURE IN PROGRESS FOR 5 DAYS    Report Status PENDING  Incomplete   Organism ID, Bacteria STAPHYLOCOCCUS AUREUS  Final      Susceptibility   Staphylococcus aureus - MIC*    CIPROFLOXACIN <=0.5 SENSITIVE Sensitive     ERYTHROMYCIN <=0.25 SENSITIVE Sensitive  GENTAMICIN <=0.5 SENSITIVE Sensitive     OXACILLIN <=0.25 SENSITIVE Sensitive     TETRACYCLINE <=1 SENSITIVE Sensitive     VANCOMYCIN <=0.5 SENSITIVE Sensitive     TRIMETH/SULFA <=10 SENSITIVE Sensitive     CLINDAMYCIN <=0.25 SENSITIVE Sensitive     RIFAMPIN <=0.5 SENSITIVE Sensitive     Inducible Clindamycin Value in next row Sensitive      NEGATIVEPerformed at [facility] [address], [place], [place] [address]    * MODERATE STAPHYLOCOCCUS AUREUS  Aerobic/Anaerobic Culture w Gram Stain (surgical/deep wound)     Status: None (Preliminary result)   Collection Time: 09/08/20 11:46 AM   Specimen: PATH Other; Tissue  Result Value Ref Range Status   Specimen Description FOOT LEFT  Final   Special Requests NONE  Final   Gram Stain   Final    RARE WBC PRESENT, PREDOMINANTLY PMN ABUNDANT GRAM POSITIVE COCCI IN CLUSTERS    Culture   Final    ABUNDANT ENTEROCOCCUS  FAECALIS MODERATE STAPHYLOCOCCUS AUREUS SUSCEPTIBILITIES TO FOLLOW WITHIN MIXED ORGANISMS    Report Status PENDING  Incomplete   Organism ID, Bacteria STAPHYLOCOCCUS AUREUS  Final      Susceptibility   Staphylococcus aureus - MIC*    CIPROFLOXACIN <=0.5 SENSITIVE Sensitive     ERYTHROMYCIN <=0.25 SENSITIVE Sensitive     GENTAMICIN <=0.5 SENSITIVE Sensitive     OXACILLIN 0.5 SENSITIVE Sensitive     TETRACYCLINE <=1 SENSITIVE Sensitive     VANCOMYCIN <=0.5 SENSITIVE Sensitive     TRIMETH/SULFA <=10 SENSITIVE Sensitive     CLINDAMYCIN <=0.25 SENSITIVE Sensitive     RIFAMPIN <=0.5 SENSITIVE Sensitive     Inducible Clindamycin Value in next row Sensitive      NEGATIVEPerformed at [facility] [address], [place], [place] [address]    * MODERATE STAPHYLOCOCCUS AUREUS  Aerobic/Anaerobic Culture w Gram Stain (surgical/deep wound)     Status: None (Preliminary result)   Collection Time: 09/10/20  8:25 AM   Specimen: Foot, Left; Tissue  Result Value Ref Range Status   Specimen Description TISSUE LEFT FOOT ULCER SPEC A  Final   Special Requests LEFT FOOT ULCER  Final   Gram Stain   Final    RARE WBC PRESENT, PREDOMINANTLY PMN ABUNDANT GRAM POSITIVE COCCI RARE GRAM NEGATIVE RODS    Culture   Final    ABUNDANT STAPHYLOCOCCUS AUREUS FEW GRAM NEGATIVE RODS CULTURE REINCUBATED FOR BETTER GROWTH Performed at [facility], [place] [address] [place], [place] [address]    Report Status PENDING  Incomplete  Culture, blood (routine x 2)     Status: None (Preliminary result)   Collection Time: 09/11/20  4:28 AM   Specimen: BLOOD LEFT HAND  Result Value Ref Range Status   Specimen Description BLOOD LEFT HAND  Final   Special Requests   Final    BOTTLES DRAWN AEROBIC ONLY Blood Culture results may not be optimal due to an inadequate volume of blood received in culture bottles   Culture   Final    NO GROWTH < 12 HOURS Performed at [facility], [place] [address],  [place], [place] [address]    Report Status PENDING  Incomplete  Culture, blood (routine x 2)     Status: None (Preliminary result)   Collection Time: 09/11/20  4:37 AM   Specimen: BLOOD  Result Value Ref Range Status   Specimen Description BLOOD LEFT ANTECUBITAL  Final   Special Requests   Final  BOTTLES DRAWN AEROBIC ONLY Blood Culture results may not be optimal due to an inadequate volume of blood received in culture bottles   Culture   Final    NO GROWTH < 12 HOURS Performed at [facility] [address], [place], [place] [address]    Report Status PENDING  Incomplete     Terri Piedra, [place] for Infectious Disease [facility]  09/11/2020  1:20 PM

## 2020-09-11 NOTE — Progress Notes (Signed)
Patient ID: Michelle Skinner, female   DOB: [birth year], 55 y.o.   MRN: [id number] Patient is postoperative day 1 debridement bilateral foot abscesses.  Patient had circumferential abscess to both the and does not have foot salvage intervention options.  I have recommended proceeding with bilateral transtibial amputations.  Plan for surgery on Wednesday.  Patient states she understands, she states her husband is also an amputee.

## 2020-09-11 NOTE — Evaluation (Signed)
Occupational Therapy Evaluation Patient Details Name: Michelle Skinner MRN: [id number] DOB: 12/25/1965 Today's Date: 09/11/2020    History of Present Illness Pt is a 55 y.o. female who presented to the ED 09/06/20 with open wounds at bil feet. Per chart, 3 weeks PTA she sustained a "bone bruise" of her R lateral foot that progressed to increased redness and pain and later spread to her L foot. Pt found to be septic 2/2 cellulitis involving feet with lactic acidosis, leukocytosism, AKI, tachycardia, and tachypnea. S/p sharp debridement of wounds 09/08/20 and irrigation with debridement 09/10/20. Possible need for amputations. PMH: morbid obesity, DM2 on insulin w/ peripheral neuropathy, HTN, GERD, and cervical cancer.   Clinical Impression   Pt presents with decline in function and safety with ADLs and ADL mobility with impaired strength, balance and endurance. Pt scheduled for B transtibial amputations 09/12/20. PTA, pt  independent with ADLs/selfcare, home mgt, with intermittent use of SPC, pt drives. Pt cares for her husband who just had a BKA. Pt currently requires mod  +2 for rolling, min guard A to sit EOB, max A - total A with LB ADLs and is NWB B LEs at this time. Pt would benefit from acute OT services to address impairments to maximize level of function and safety    Follow Up Recommendations  CIR (is CIR possible with amuputations? SNF if not)    Equipment Recommendations  Wheelchair (measurements OT);Wheelchair cushion (measurements OT)    Recommendations for Other Services       Precautions / Restrictions Precautions Precautions: Fall Precaution Comments: NWB bil legs Restrictions Weight Bearing Restrictions: Yes Other Position/Activity Restrictions: NWB Bil lower extremities      Mobility Bed Mobility Overal bed mobility: Needs Assistance Bed Mobility: Rolling;Supine to Sit Rolling: Mod assist;+2 for physical assistance   Supine to sit: Min guard     General bed mobility  comments: mod A +2 rolling in bed with assist from OT and RN for hygiene, pt sat EOB no physical assist    Transfers                 General transfer comment: NWB bil legs, deferred. bilateral transtibial amputations scheduled for 09/12/20    Balance Overall balance assessment: Needs assistance Sitting-balance support: Bilateral upper extremity supported;Feet unsupported Sitting balance-Leahy Scale: Fair         Standing balance comment: NWB bil legs, deferred                           ADL either performed or assessed with clinical judgement   ADL Overall ADL's : Needs assistance/impaired Eating/Feeding: Set up;Independent;Sitting   Grooming: Wash/dry hands;Wash/dry face;Min guard;Sitting   Upper Body Bathing: Min guard;Sitting   Lower Body Bathing: Maximal assistance   Upper Body Dressing : Min guard;Sitting   Lower Body Dressing: Total assistance     Toilet Transfer Details (indicate cue type and reason): NWB B LEs, deferred Toileting- Clothing Manipulation and Hygiene: Total assistance;Bed level               Vision Baseline Vision/History: Wears glasses Patient Visual Report: No change from baseline       Perception     Praxis      Pertinent Vitals/Pain Pain Assessment: Faces Faces Pain Scale: Hurts whole lot Pain Location: bil feet Pain Descriptors / Indicators: Burning;Sharp;Stabbing;Grimacing;Guarding Pain Intervention(s): Limited activity within patient's tolerance;Monitored during session;Premedicated before session;Repositioned     Hand Dominance Right  Extremity/Trunk Assessment Upper Extremity Assessment Upper Extremity Assessment: Generalized weakness   Lower Extremity Assessment Lower Extremity Assessment: Defer to PT evaluation   Cervical / Trunk Assessment Cervical / Trunk Assessment: Kyphotic   Communication Communication Communication: No difficulties   Cognition Arousal/Alertness: Awake/alert Behavior  During Therapy: WFL for tasks assessed/performed Overall Cognitive Status: Within Functional Limits for tasks assessed                                     General Comments       Exercises     Shoulder Instructions      Home Living Family/patient expects to be discharged to:: Private residence Living Arrangements: Spouse/significant other Available Help at Discharge: Family;Available PRN/intermittently Type of Home: Apartment Home Access: Ramped entrance     Home Layout: One level     Bathroom Shower/Tub: Teacher, early years/pre: Standard     Home Equipment: Cane - single point;Walker - 2 wheels;Wheelchair - manual;Shower seat   Additional Comments: Pt sleeps on love seat, says she does not have a bed.      Prior Functioning/Environment Level of Independence: Independent with assistive device(s)        Comments: Pt independent with intermittent use of SPC. Pt cares for her husband who just had a BKA. Pt drives.        OT Problem List: Decreased strength;Impaired balance (sitting and/or standing);Pain;Decreased activity tolerance;Decreased coordination;Decreased knowledge of use of DME or AE      OT Treatment/Interventions: Self-care/ADL training;Therapeutic exercise;Patient/family education;Therapeutic activities;Balance training;DME and/or AE instruction    OT Goals(Current goals can be found in the care plan section) Acute Rehab OT Goals Patient Stated Goal: to get better OT Goal Formulation: With patient Time For Goal Achievement: 09/25/20 Potential to Achieve Goals: Good ADL Goals Pt Will Perform Grooming: with supervision;with set-up;sitting Pt Will Perform Upper Body Bathing: with supervision;with set-up;sitting Pt Will Perform Lower Body Bathing: with mod assist;sitting/lateral leans Pt Will Perform Upper Body Dressing: with supervision;with set-up;sitting Pt Will Transfer to Toilet: with transfer board;anterior/posterior  transfer;bedside commode  OT Frequency: Min 2X/week   Barriers to D/C:            Co-evaluation              AM-PAC OT "6 Clicks" Daily Activity     Outcome Measure Help from another person eating meals?: None Help from another person taking care of personal grooming?: A Little Help from another person toileting, which includes using toliet, bedpan, or urinal?: Total Help from another person bathing (including washing, rinsing, drying)?: A Lot Help from another person to put on and taking off regular upper body clothing?: A Little Help from another person to put on and taking off regular lower body clothing?: Total 6 Click Score: 14   End of Session    Activity Tolerance: Patient limited by pain Patient left: in bed;with call bell/phone within reach  OT Visit Diagnosis: Other abnormalities of gait and mobility (R26.89);Muscle weakness (generalized) (M62.81);Pain Pain - part of body: Leg (B LEs)                Time: 9518-8416 OT Time Calculation (min): 28 min Charges:  OT General Charges $OT Visit: 1 Visit OT Evaluation $OT Eval Moderate Complexity: 1 Mod OT Treatments $Self Care/Home Management : 8-22 mins    Britt Bottom 09/11/2020, 4:27 PM

## 2020-09-11 NOTE — Social Work (Addendum)
Pts Passr doc have been uploaded.   Pts passr number is [id number] [place], [place], [place] Social Worker [phone]

## 2020-09-11 NOTE — Plan of Care (Signed)
  Problem: Activity: Goal: Risk for activity intolerance will decrease Outcome: Progressing   Problem: Nutrition: Goal: Adequate nutrition will be maintained Outcome: Progressing   Problem: Coping: Goal: Level of anxiety will decrease Outcome: Progressing   Problem: Safety: Goal: Ability to remain free from injury will improve Outcome: Progressing   Problem: Skin Integrity: Goal: Risk for impaired skin integrity will decrease Outcome: Progressing   

## 2020-09-11 NOTE — Progress Notes (Signed)
PROGRESS NOTE    Michelle Skinner  YJE:[id number] DOB: 1965-08-31 DOA: 09/06/2020 PCP: Monico Blitz, MD   Chief Complaint  Patient presents with  . Foot Pain    Brief admission narrative:  55 y.o. female with medical history significant of morbid obesity, DM2 on insulin w/ peripheral neuropathy, HTN, GERD. She reports that she incurred "bone bruises" to both feet several weeks ago which have progressed to open wounds. She had progressive weakness and marked pain in both feet. She presented to [facility] for evaluation.  Pt had initial debridement by Dr. Constance Haw at [facility] and subsequently transferred to [facility] for consult with Dr. Sharol Given.    Assessment & Plan: 1-Uncontrolled type 2 diabetes mellitus with complication, with long-term current use of insulin: Presented with DKA -DKA resolved after IV insulin infusion -increased lantus to 28 units BID plus SSI, increase prandial coverage to 15 units TID with meals -continue to follow CBG's trend -maintain adequate hydration -follow modified carb diet. CBG (last 3)  Recent Labs    09/11/20 0320 09/11/20 0722 09/11/20 1115  GLUCAP 121* 129* 117*    2-Sepsis secondary to bilateral MSSA Bacteremia, cellulitis of both feet and legs - RESOLVED -Appreciate ID consult, order TTE, continue IV cefazolin.   -General surgery and orthopedic service consulted (Dr. Sharol Given); will follow recommendations and further inputs as part of wound care/debridement.   Dr. Sharol Given reports patient likely will require bilateral transtibial amputation and CIR placement.  Consult to TOC.     3-Primary hypothyroidism -Continue Synthroid  4-Mixed hyperlipidemia -Continue statins  5-Essential hypertension, benign -Intermittent soft blood pressures, stable, asymptomatic.  -Continue holding hydralazine -continue adjusted dose of carvedilol -Close monitoring of patient vital signs.  6-acute on chronic renal failure; stage IIIa at baseline -Continue to minimize nephrotoxic agents,  hypotension and the use of contrast -Follow-up renal function trend -Cr trending down appropriately and towards baseline.   7-moderate obesity -Body mass index is 50.38 kg/m. -Low calorie diet and portion control discussed with patient.  8-depression -No suicidal ideation hallucination -Continue Prozac and trazodone.  9-gastroesophageal flux disease -Continue PPI.  Leukocytosis -WBC trending down.    DVT prophylaxis: Lovenox Code Status: Full code Family Communication: husband t/c 3/7  Disposition:  Anticipating CIR placement  Status is: Inpatient  Dispo: The patient is from: Home              Anticipated d/c is to: To be determined              Patient currently not medically stable for discharge; receiving treatment for DKA, sepsis and ongoing bilateral cellulitic process and feet wounds requiring IV antibiotics and surgical debridement. Patient will be transfer to [facility] for orthopedic evaluation and further wound care.    Difficult to place patient no   Consultants:   General surgery   Orthopedic service (Dr. Sharol Given)   Procedures:  See below for x-ray reports Incision, drainage and surgical debridement of bilateral feet wounds. 09/08/20   Antimicrobials:  Rocephin and vancomycin  Subjective: Pt upset that she may need an amputation.   Objective: Vitals:   09/10/20 1925 09/11/20 0025 09/11/20 0500 09/11/20 0726  BP: 139/66 136/63 132/74 (!) 148/67  Pulse: 71 68 74 69  Resp: 19 17 19 17   Temp: 98.9 F (37.2 C) 98 F (36.7 C) 98.2 F (36.8 C) (!) 97.4 F (36.3 C)  TempSrc: Oral Oral Oral Oral  SpO2: 100% 97% 98% 100%  Weight:      Height:  Intake/Output Summary (Last 24 hours) at 09/11/2020 1145 Last data filed at 09/11/2020 0500 Gross per 24 hour  Intake 398.64 ml  Output 300 ml  Net 98.64 ml   Filed Weights   09/06/20 1249 09/06/20 2200  Weight: (!) 158.8 kg (!) 148.1 kg    Examination: General exam: awake, alert, NAD, cooperative.   Respiratory system: no increased work of breathing.  Cardiovascular system:normal s1, s2 sounds distant.   Gastrointestinal system: Abdomen is obese, nondistended, soft and nontender. No organomegaly or masses felt. Normal bowel sounds heard. Central nervous system: Alert and oriented. No focal neurological deficits. Extremities/skin: chronic lymphedema bilateral lower extremities, also with bilateral chronic stasis dermatitis, and ascending erythematous changes. Both feet in bandages clean, dry, intact.  Psychiatry: Judgement and insight appear poor. Mood & affect flat.    Data Reviewed: I have personally reviewed following labs and imaging studies  CBC: Recent Labs  Lab 09/06/20 1401 09/07/20 0622 09/11/20 0428  WBC 25.4* 21.2* 15.9*  NEUTROABS 22.1*  --  12.9*  HGB 9.8* 8.8* 7.5*  HCT 31.6* 27.3* 23.7*  MCV 91.1 86.9 87.5  PLT 402* 353 903    Basic Metabolic Panel: Recent Labs  Lab 09/06/20 1401 09/07/20 0622 09/07/20 1126 09/11/20 0428  NA 124* 131* 132* 132*  K 3.9 3.4* 3.3* 3.5  CL 88* 99 98 103  CO2 10* 21* 22 18*  GLUCOSE 591* 221* 192* 129*  BUN 52* 52* 51* 36*  CREATININE 1.94* 1.81* 1.79* 1.47*  CALCIUM 8.8* 7.7* 7.8* 7.5*  MG  --   --   --  2.0    GFR: Estimated Creatinine Clearance: 67.7 mL/min (A) (by C-G formula based on SCr of 1.47 mg/dL (H)).  Liver Function Tests: Recent Labs  Lab 09/06/20 1401 09/11/20 0428  AST 12* 12*  ALT 14 5  ALKPHOS 120 99  BILITOT 2.4* 0.7  PROT 7.0 5.8*  ALBUMIN 1.6* <1.0*    CBG: Recent Labs  Lab 09/10/20 1626 09/10/20 1924 09/11/20 0320 09/11/20 0722 09/11/20 1115  GLUCAP 122* 99 121* 129* 117*    Recent Results (from the past 240 hour(s))  Resp Panel by RT-PCR (Flu A&B, Covid) Nasopharyngeal Swab     Status: None   Collection Time: 09/06/20  2:01 PM   Specimen: Nasopharyngeal Swab; Nasopharyngeal(NP) swabs in vial transport medium  Result Value Ref Range Status   SARS Coronavirus 2 by RT PCR  NEGATIVE NEGATIVE Final    Comment: (NOTE) SARS-CoV-2 target nucleic acids are NOT DETECTED.  The SARS-CoV-2 RNA is generally detectable in upper respiratory specimens during the acute phase of infection. The lowest concentration of SARS-CoV-2 viral copies this assay can detect is 138 copies/mL. A negative result does not preclude SARS-Cov-2 infection and should not be used as the sole basis for treatment or other patient management decisions. A negative result may occur with  improper specimen collection/handling, submission of specimen other than nasopharyngeal swab, presence of viral mutation(s) within the areas targeted by this assay, and inadequate number of viral copies(<138 copies/mL). A negative result must be combined with clinical observations, patient history, and epidemiological information. The expected result is Negative.  Fact Sheet for Patients:  EntrepreneurPulse.com.au  Fact Sheet for Healthcare Providers:  IncredibleEmployment.be  This test is no t yet approved or cleared by the [place] FDA and  has been authorized for detection and/or diagnosis of SARS-CoV-2 by FDA under an Emergency Use Authorization (EUA). This EUA will remain  in effect (meaning this test can be  used) for the duration of the COVID-19 declaration under Section 564(b)(1) of the Act, 21 U.S.C.section 360bbb-3(b)(1), unless the authorization is terminated  or revoked sooner.       Influenza A by PCR NEGATIVE NEGATIVE Final   Influenza B by PCR NEGATIVE NEGATIVE Final    Comment: (NOTE) The Xpert Xpress SARS-CoV-2/FLU/RSV plus assay is intended as an aid in the diagnosis of influenza from Nasopharyngeal swab specimens and should not be used as a sole basis for treatment. Nasal washings and aspirates are unacceptable for Xpert Xpress SARS-CoV-2/FLU/RSV testing.  Fact Sheet for Patients: EntrepreneurPulse.com.au  Fact Sheet for  Healthcare Providers: IncredibleEmployment.be  This test is not yet approved or cleared by the [place] FDA and has been authorized for detection and/or diagnosis of SARS-CoV-2 by FDA under an Emergency Use Authorization (EUA). This EUA will remain in effect (meaning this test can be used) for the duration of the COVID-19 declaration under Section 564(b)(1) of the Act, 21 U.S.C. section 360bbb-3(b)(1), unless the authorization is terminated or revoked.  Performed at [facility], [address] [place], [place] [address]   Urine culture     Status: Abnormal   Collection Time: 09/06/20  2:01 PM   Specimen: In/Out Cath Urine  Result Value Ref Range Status   Specimen Description   Final    IN/OUT CATH URINE Performed at [facility], [address] [place], [place] [address]    Special Requests   Final    NONE Performed at [facility], [address] [place], [place] [address]    Culture (A)  Final    1,000 COLONIES/mL PROTEUS MIRABILIS 3,000 COLONIES/mL KLEBSIELLA PNEUMONIAE    Report Status 09/09/2020 FINAL  Final   Organism ID, Bacteria PROTEUS MIRABILIS (A)  Final   Organism ID, Bacteria KLEBSIELLA PNEUMONIAE (A)  Final      Susceptibility   Klebsiella pneumoniae - MIC*    AMPICILLIN RESISTANT Resistant     CEFAZOLIN <=4 SENSITIVE Sensitive     CEFEPIME <=0.12 SENSITIVE Sensitive     CEFTRIAXONE <=0.25 SENSITIVE Sensitive     CIPROFLOXACIN <=0.25 SENSITIVE Sensitive     GENTAMICIN <=1 SENSITIVE Sensitive     IMIPENEM <=0.25 SENSITIVE Sensitive     NITROFURANTOIN 32 SENSITIVE Sensitive     TRIMETH/SULFA <=20 SENSITIVE Sensitive     AMPICILLIN/SULBACTAM <=2 SENSITIVE Sensitive     PIP/TAZO <=4 SENSITIVE Sensitive     * 3,000 COLONIES/mL KLEBSIELLA PNEUMONIAE   Proteus mirabilis - MIC*    AMPICILLIN <=2 SENSITIVE Sensitive     CEFAZOLIN 8 SENSITIVE Sensitive     CEFEPIME <=0.12 SENSITIVE Sensitive     CEFTRIAXONE <=0.25 SENSITIVE Sensitive      CIPROFLOXACIN >=4 RESISTANT Resistant     GENTAMICIN <=1 SENSITIVE Sensitive     IMIPENEM 2 SENSITIVE Sensitive     NITROFURANTOIN 128 RESISTANT Resistant     TRIMETH/SULFA >=320 RESISTANT Resistant     AMPICILLIN/SULBACTAM <=2 SENSITIVE Sensitive     PIP/TAZO <=4 SENSITIVE Sensitive     * 1,000 COLONIES/mL PROTEUS MIRABILIS  Blood Culture (routine x 2)     Status: Abnormal   Collection Time: 09/06/20  2:14 PM   Specimen: BLOOD  Result Value Ref Range Status   Specimen Description   Final    BLOOD LEFT ANTECUBITAL Performed at [facility], [address] [place] [address] [place], [place] [address]    Special Requests   Final    BOTTLES DRAWN AEROBIC AND ANAEROBIC Blood Culture adequate volume Performed at [facility], [address] [place], [place] [address]    Culture  Setup Time   Final    GRAM POSITIVE COCCI IN BOTH AEROBIC AND ANAEROBIC BOTTLES Gram Stain Report Called to,Read Back By and Verified With: LARIMORE,A@0725  BY MATTHEWS, B 3.3.2022 Performed at [place] ID to follow CRITICAL RESULT CALLED TO, READ BACK BY AND VERIFIED WITHMartin Majestic RN [id number] 09/07/20 A BROWNING Performed at [facility], [place] [address] [place], [place] [address]    Culture STAPHYLOCOCCUS AUREUS (A)  Final   Report Status 09/09/2020 FINAL  Final   Organism ID, Bacteria STAPHYLOCOCCUS AUREUS  Final      Susceptibility   Staphylococcus aureus - MIC*    CIPROFLOXACIN <=0.5 SENSITIVE Sensitive     ERYTHROMYCIN <=0.25 SENSITIVE Sensitive     GENTAMICIN <=0.5 SENSITIVE Sensitive     OXACILLIN <=0.25 SENSITIVE Sensitive     TETRACYCLINE <=1 SENSITIVE Sensitive     VANCOMYCIN <=0.5 SENSITIVE Sensitive     TRIMETH/SULFA <=10 SENSITIVE Sensitive     CLINDAMYCIN <=0.25 SENSITIVE Sensitive     RIFAMPIN <=0.5 SENSITIVE Sensitive     Inducible Clindamycin NEGATIVE Sensitive     * STAPHYLOCOCCUS AUREUS  Blood Culture ID Panel (Reflexed)     Status: Abnormal   Collection Time: 09/06/20  2:14 PM   Result Value Ref Range Status   Enterococcus faecalis NOT DETECTED NOT DETECTED Final   Enterococcus Faecium NOT DETECTED NOT DETECTED Final   Listeria monocytogenes NOT DETECTED NOT DETECTED Final   Staphylococcus species DETECTED (A) NOT DETECTED Final    Comment: CRITICAL RESULT CALLED TO, READ BACK BY AND VERIFIED WITH: Martin Majestic RN [phone] 09/07/20 A BROWNING    Staphylococcus aureus (BCID) DETECTED (A) NOT DETECTED Final    Comment: CRITICAL RESULT CALLED TO, READ BACK BY AND VERIFIED WITH: Martin Majestic RN [phone] 09/07/20 A BROWNING    Staphylococcus epidermidis NOT DETECTED NOT DETECTED Final   Staphylococcus lugdunensis NOT DETECTED NOT DETECTED Final   Streptococcus species NOT DETECTED NOT DETECTED Final   Streptococcus agalactiae NOT DETECTED NOT DETECTED Final   Streptococcus pneumoniae NOT DETECTED NOT DETECTED Final   Streptococcus pyogenes NOT DETECTED NOT DETECTED Final   A.calcoaceticus-baumannii NOT DETECTED NOT DETECTED Final   Bacteroides fragilis NOT DETECTED NOT DETECTED Final   Enterobacterales NOT DETECTED NOT DETECTED Final   Enterobacter cloacae complex NOT DETECTED NOT DETECTED Final   Escherichia coli NOT DETECTED NOT DETECTED Final   Klebsiella aerogenes NOT DETECTED NOT DETECTED Final   Klebsiella oxytoca NOT DETECTED NOT DETECTED Final   Klebsiella pneumoniae NOT DETECTED NOT DETECTED Final   Proteus species NOT DETECTED NOT DETECTED Final   Salmonella species NOT DETECTED NOT DETECTED Final   Serratia marcescens NOT DETECTED NOT DETECTED Final   Haemophilus influenzae NOT DETECTED NOT DETECTED Final   Neisseria meningitidis NOT DETECTED NOT DETECTED Final   Pseudomonas aeruginosa NOT DETECTED NOT DETECTED Final   Stenotrophomonas maltophilia NOT DETECTED NOT DETECTED Final   Candida albicans NOT DETECTED NOT DETECTED Final   Candida auris NOT DETECTED NOT DETECTED Final   Candida glabrata NOT DETECTED NOT DETECTED Final   Candida krusei NOT DETECTED NOT  DETECTED Final   Candida parapsilosis NOT DETECTED NOT DETECTED Final   Candida tropicalis NOT DETECTED NOT DETECTED Final   Cryptococcus neoformans/gattii NOT DETECTED NOT DETECTED Final   Meth resistant mecA/C and MREJ NOT DETECTED NOT DETECTED Final    Comment: Performed at [facility], [address], [place], [place] [address]  Blood Culture (routine x 2)     Status: None   Collection Time: 09/06/20  3:59 PM   Specimen: BLOOD RIGHT HAND  Result Value Ref Range Status   Specimen Description BLOOD RIGHT HAND  Final   Special Requests   Final    BOTTLES DRAWN AEROBIC AND ANAEROBIC Blood Culture adequate volume   Culture   Final    NO GROWTH 5 DAYS Performed at [facility], [address], [place], [place] [address]    Report Status 09/11/2020 FINAL  Final  MRSA PCR Screening     Status: None   Collection Time: 09/06/20  9:46 PM   Specimen: Nasal Mucosa; Nasopharyngeal  Result Value Ref Range Status   MRSA by PCR NEGATIVE NEGATIVE Final    Comment:        The GeneXpert MRSA Assay (FDA approved for NASAL specimens only), is one component of a comprehensive MRSA colonization surveillance program. It is not intended to diagnose MRSA infection nor to guide or monitor treatment for MRSA infections. Performed at [facility], [address], [place], [place] [address]   Aerobic/Anaerobic Culture w Gram Stain (surgical/deep wound)     Status: None (Preliminary result)   Collection Time: 09/08/20 11:04 AM   Specimen: PATH Other; Tissue  Result Value Ref Range Status   Specimen Description   Final    FOOT RIGHT Performed at [facility], [address] [place], [place] [address]    Special Requests   Final    NONE Performed at [facility], [address], [place], [place] [address]    Gram Stain   Final    RARE WBC PRESENT, PREDOMINANTLY PMN MODERATE GRAM POSITIVE COCCI IN PAIRS IN CLUSTERS Performed at [facility], [place] [address] [place], [place] [address]     Culture   Final    ABUNDANT STAPHYLOCOCCUS AUREUS MODERATE ENTEROCOCCUS FAECALIS SUSCEPTIBILITIES TO FOLLOW NO ANAEROBES ISOLATED; CULTURE IN PROGRESS FOR 5 DAYS    Report Status PENDING  Incomplete  Aerobic/Anaerobic Culture w Gram Stain (surgical/deep wound)     Status: None (Preliminary result)   Collection Time: 09/08/20 11:45 AM   Specimen: PATH Other; Tissue  Result Value Ref Range Status   Specimen Description   Final    FOOT RIGHT Performed at [facility], [address] [place], [place] [address]    Special Requests   Final    NONE Performed at [facility], [address], [place], [place] [address]    Gram Stain   Final    FEW WBC PRESENT, PREDOMINANTLY PMN MODERATE GRAM POSITIVE COCCI IN PAIRS IN CLUSTERS RARE GRAM NEGATIVE RODS Performed at [facility], [place] [address] [place], [place] [address]    Culture   Final    MODERATE STAPHYLOCOCCUS AUREUS NO ANAEROBES ISOLATED; CULTURE IN PROGRESS FOR 5 DAYS    Report Status PENDING  Incomplete  Aerobic/Anaerobic Culture w Gram Stain (surgical/deep wound)     Status: None (Preliminary result)   Collection Time: 09/08/20 11:46 AM   Specimen: PATH Other; Tissue  Result Value Ref Range Status   Specimen Description   Final    FOOT LEFT Performed at [facility], [address] [place], [place] [address]    Special Requests   Final    NONE Performed at [facility], [address] [place], [place] [address]    Gram Stain   Final    RARE WBC PRESENT, PREDOMINANTLY  PMN ABUNDANT GRAM POSITIVE COCCI IN CLUSTERS    Culture   Final    ABUNDANT ENTEROCOCCUS FAECALIS MODERATE STAPHYLOCOCCUS AUREUS SUSCEPTIBILITIES TO FOLLOW WITHIN MIXED ORGANISMS Performed at [facility], [place] [address], [place], [place] [address]    Report Status PENDING  Incomplete  Aerobic/Anaerobic Culture w Gram Stain (surgical/deep wound)     Status: None (Preliminary result)   Collection Time: 09/10/20  8:25 AM   Specimen:  Foot, Left; Tissue  Result Value Ref Range Status   Specimen Description TISSUE LEFT FOOT ULCER SPEC A  Final   Special Requests LEFT FOOT ULCER  Final   Gram Stain   Final    RARE WBC PRESENT, PREDOMINANTLY PMN ABUNDANT GRAM POSITIVE COCCI RARE GRAM NEGATIVE RODS    Culture   Final    ABUNDANT STAPHYLOCOCCUS AUREUS FEW GRAM NEGATIVE RODS CULTURE REINCUBATED FOR BETTER GROWTH Performed at [facility], [place] [address], [place], [place] [address]    Report Status PENDING  Incomplete     Radiology Studies: ECHOCARDIOGRAM COMPLETE  Result Date: 09/10/2020    ECHOCARDIOGRAM REPORT   Patient Name:   Michelle Skinner Date of Exam: 09/10/2020 Medical Rec #:  [id number]      Height:       67.5 in Accession #:    [id number]     Weight:       326.5 lb Date of Birth:  1965/11/05     BSA:          2.504 m Patient Age:    62 years       BP:           128/57 mmHg Patient Gender: F              HR:           73 bpm. Exam Location:  Inpatient Procedure: 2D Echo, Cardiac Doppler and Color Doppler Indications:    Bacteremia R78.81  History:        Patient has no prior history of Echocardiogram examinations.                 Risk Factors:Diabetes and Dyslipidemia. Cancer. GERD.  Sonographer:    Jonelle Sidle Dance Referring Phys: Sam Rayburn  1. Left ventricular ejection fraction, by estimation, is 55 to 60%. The left ventricle has normal function. The left ventricle has no regional wall motion abnormalities. There is mild concentric left ventricular hypertrophy. Left ventricular diastolic parameters were normal.  2. Right ventricular systolic function is normal. The right ventricular size is normal. Tricuspid regurgitation signal is inadequate for assessing PA pressure.  3. The mitral valve is normal in structure. Trivial mitral valve regurgitation. No evidence of mitral stenosis.  4. The aortic valve is grossly normal. There is mild calcification of the aortic valve. Aortic valve regurgitation is not  visualized. No aortic stenosis is present.  5. The inferior vena cava is dilated in size with >50% respiratory variability, suggesting right atrial pressure of 8 mmHg. Comparison(s): No prior Echocardiogram. Conclusion(s)/Recommendation(s): Normal biventricular function without evidence of hemodynamically significant valvular heart disease. No evidence of valvular vegetations on this transthoracic echocardiogram. Would recommend a transesophageal echocardiogram to exclude infective endocarditis if clinically indicated. FINDINGS  Left Ventricle: Left ventricular ejection fraction, by estimation, is 55 to 60%. The left ventricle has normal function. The left ventricle has no regional wall motion abnormalities. The left ventricular internal cavity size was normal in size. There is  mild concentric  left ventricular hypertrophy. Left ventricular diastolic parameters were normal. Right Ventricle: The right ventricular size is normal. No increase in right ventricular wall thickness. Right ventricular systolic function is normal. Tricuspid regurgitation signal is inadequate for assessing PA pressure. Left Atrium: Left atrial size was normal in size. Right Atrium: Right atrial size was normal in size. Pericardium: Trivial pericardial effusion is present. Mitral Valve: The mitral valve is normal in structure. Mild to moderate mitral annular calcification. Trivial mitral valve regurgitation. No evidence of mitral valve stenosis. Tricuspid Valve: The tricuspid valve is normal in structure. Tricuspid valve regurgitation is trivial. No evidence of tricuspid stenosis. Aortic Valve: The aortic valve is grossly normal. There is mild calcification of the aortic valve. Aortic valve regurgitation is not visualized. No aortic stenosis is present. Pulmonic Valve: The pulmonic valve was not well visualized. Pulmonic valve regurgitation is not visualized. Aorta: The aortic root, ascending aorta, aortic arch and descending aorta are all  structurally normal, with no evidence of dilitation or obstruction. Venous: The inferior vena cava is dilated in size with greater than 50% respiratory variability, suggesting right atrial pressure of 8 mmHg. IAS/Shunts: The atrial septum is grossly normal.  LEFT VENTRICLE PLAX 2D LVIDd:         4.90 cm  Diastology LVIDs:         3.10 cm  LV e' medial:    6.64 cm/s LV PW:         1.20 cm  LV E/e' medial:  17.0 LV IVS:        1.25 cm  LV e' lateral:   11.20 cm/s LVOT diam:     1.70 cm  LV E/e' lateral: 10.1 LV SV:         61 LV SV Index:   24 LVOT Area:     2.27 cm  RIGHT VENTRICLE             IVC RV Basal diam:  2.90 cm     IVC diam: 2.90 cm RV S prime:     12.70 cm/s TAPSE (M-mode): 2.3 cm LEFT ATRIUM             Index       RIGHT ATRIUM           Index LA diam:        4.10 cm 1.64 cm/m  RA Area:     15.20 cm LA Vol (A2C):   72.9 ml 29.11 ml/m RA Volume:   38.30 ml  15.29 ml/m LA Vol (A4C):   64.3 ml 25.68 ml/m LA Biplane Vol: 70.8 ml 28.27 ml/m  AORTIC VALVE LVOT Vmax:   122.00 cm/s LVOT Vmean:  83.900 cm/s LVOT VTI:    0.268 m  AORTA Ao Root diam: 3.00 cm Ao Asc diam:  3.50 cm MITRAL VALVE MV Area (PHT): 2.83 cm     SHUNTS MV Decel Time: 268 msec     Systemic VTI:  0.27 m MV E velocity: 113.00 cm/s  Systemic Diam: 1.70 cm MV A velocity: 85.70 cm/s MV E/A ratio:  1.32 Buford Dresser MD Electronically signed by Buford Dresser MD Signature Date/Time: 09/10/2020/4:13:43 PM    Final     Scheduled Meds: . atorvastatin  40 mg Oral QHS  . carvedilol  12.5 mg Oral BID WC  . Chlorhexidine Gluconate Cloth  6 each Topical Daily  . cholecalciferol  5,000 Units Oral Daily  . docusate sodium  100 mg Oral BID  . enoxaparin (LOVENOX) injection  70  mg Subcutaneous Q24H  . FLUoxetine  40 mg Oral Daily  . insulin aspart  0-20 Units Subcutaneous TID WC  . insulin aspart  15 Units Subcutaneous TID WC  . insulin glargine  28 Units Subcutaneous BID  . levothyroxine  150 mcg Oral QAC breakfast  .  magnesium oxide  200 mg Oral Daily  . pantoprazole  40 mg Oral Daily  . senna  1 tablet Oral BID  . sodium hypochlorite   Irrigation BID   Continuous Infusions: . sodium chloride 250 mL (09/09/20 2256)  . sodium chloride    . ampicillin-sulbactam (UNASYN) IV 3 g (09/11/20 1107)  . methocarbamol (ROBAXIN) IV       LOS: 5 days   Time spent: 35 minutes  Jyren Cerasoli Wynetta Emery, MD How to contact the [facility] Attending or Consulting provider [place] or covering provider during after hours [place], for this patient?  1. Check the care team in [facility] and look for a) attending/consulting [facility] provider listed and b) the [facility] team listed 2. Log into [email or web address] and use [facility] universal password to access. If you do not have the password, please contact the hospital operator. 3. Locate the [facility] provider you are looking for under [facility] and page to a number that you can be directly reached. 4. If you still have difficulty reaching the provider, please page the [facility] (Director on Call) for the Hospitalists listed on amion for assistance.   09/11/2020, 11:45 AM

## 2020-09-11 NOTE — Progress Notes (Signed)
Ortho MD made aware that pt's husband would like to get an update from him regarding surgery.

## 2020-09-12 DIAGNOSIS — Z6841 Body Mass Index (BMI) 40.0 and over, adult: Secondary | ICD-10-CM | POA: Diagnosis not present

## 2020-09-12 DIAGNOSIS — B9689 Other specified bacterial agents as the cause of diseases classified elsewhere: Secondary | ICD-10-CM

## 2020-09-12 DIAGNOSIS — L03119 Cellulitis of unspecified part of limb: Secondary | ICD-10-CM | POA: Diagnosis not present

## 2020-09-12 DIAGNOSIS — L97513 Non-pressure chronic ulcer of other part of right foot with necrosis of muscle: Secondary | ICD-10-CM | POA: Diagnosis not present

## 2020-09-12 DIAGNOSIS — I10 Essential (primary) hypertension: Secondary | ICD-10-CM | POA: Diagnosis not present

## 2020-09-12 DIAGNOSIS — I890 Lymphedema, not elsewhere classified: Secondary | ICD-10-CM

## 2020-09-12 DIAGNOSIS — M86071 Acute hematogenous osteomyelitis, right ankle and foot: Secondary | ICD-10-CM | POA: Diagnosis not present

## 2020-09-12 LAB — SURGICAL PCR SCREEN
MRSA, PCR: NEGATIVE
Staphylococcus aureus: POSITIVE — AB

## 2020-09-12 LAB — COMPREHENSIVE METABOLIC PANEL
ALT: 6 U/L (ref 0–44)
AST: 12 U/L — ABNORMAL LOW (ref 15–41)
Albumin: <1 g/dL — ABNORMAL LOW (ref 3.5–5.0)
Alkaline Phosphatase: 95 U/L (ref 38–126)
Anion gap: 8 (ref 5–15)
BUN: 30 mg/dL — ABNORMAL HIGH (ref 6–20)
CO2: 22 mmol/L (ref 22–32)
Calcium: 7.5 mg/dL — ABNORMAL LOW (ref 8.9–10.3)
Chloride: 106 mmol/L (ref 98–111)
Creatinine, Ser: 1.41 mg/dL — ABNORMAL HIGH (ref 0.44–1.00)
GFR, Estimated: 45 mL/min — ABNORMAL LOW (ref >60)
Glucose, Bld: 186 mg/dL — ABNORMAL HIGH (ref 70–99)
Potassium: 3.6 mmol/L (ref 3.5–5.1)
Sodium: 136 mmol/L (ref 135–145)
Total Bilirubin: 0.6 mg/dL (ref 0.3–1.2)
Total Protein: 5.9 g/dL — ABNORMAL LOW (ref 6.5–8.1)

## 2020-09-12 LAB — GLUCOSE, CAPILLARY
Glucose-Capillary: 201 mg/dL — ABNORMAL HIGH (ref 70–99)
Glucose-Capillary: 221 mg/dL — ABNORMAL HIGH (ref 70–99)
Glucose-Capillary: 90 mg/dL (ref 70–99)
Glucose-Capillary: 145 mg/dL — ABNORMAL HIGH (ref 70–99)

## 2020-09-12 LAB — CBC WITH DIFFERENTIAL/PLATELET
Abs Immature Granulocytes: 0.65 10*3/uL — ABNORMAL HIGH (ref 0.00–0.07)
Basophils Absolute: 0 10*3/uL (ref 0.0–0.1)
Basophils Relative: 0 %
Eosinophils Absolute: 0.1 10*3/uL (ref 0.0–0.5)
Eosinophils Relative: 1 %
HCT: 23.7 % — ABNORMAL LOW (ref 36.0–46.0)
Hemoglobin: 7.6 g/dL — ABNORMAL LOW (ref 12.0–15.0)
Immature Granulocytes: 5 %
Lymphocytes Relative: 9 %
Lymphs Abs: 1.1 10*3/uL (ref 0.7–4.0)
MCH: 28.4 pg (ref 26.0–34.0)
MCHC: 32.1 g/dL (ref 30.0–36.0)
MCV: 88.4 fL (ref 80.0–100.0)
Monocytes Absolute: 0.9 10*3/uL (ref 0.1–1.0)
Monocytes Relative: 7 %
Neutro Abs: 9.7 10*3/uL — ABNORMAL HIGH (ref 1.7–7.7)
Neutrophils Relative %: 78 %
Platelets: 296 10*3/uL (ref 150–400)
RBC: 2.68 MIL/uL — ABNORMAL LOW (ref 3.87–5.11)
RDW: 17.5 % — ABNORMAL HIGH (ref 11.5–15.5)
WBC: 12.5 10*3/uL — ABNORMAL HIGH (ref 4.0–10.5)
nRBC: 0.2 % (ref 0.0–0.2)

## 2020-09-12 LAB — MAGNESIUM: Magnesium: 2 mg/dL (ref 1.7–2.4)

## 2020-09-12 MED ORDER — DEXTROSE 5 % IV SOLN
3.0000 g | INTRAVENOUS | Status: DC
  Filled 2020-09-12: qty 3000

## 2020-09-12 MED ORDER — CHLORHEXIDINE GLUCONATE CLOTH 2 % EX PADS
6.0000 | MEDICATED_PAD | Freq: Every day | CUTANEOUS | Status: DC
  Administered 2020-09-13 – 2020-09-17 (×3): 6 via TOPICAL

## 2020-09-12 MED ORDER — MUPIROCIN 2 % EX OINT: 1.0000 "application " | TOPICAL_OINTMENT | Freq: Two times a day (BID) | CUTANEOUS | Status: DC

## 2020-09-12 MED ORDER — MUPIROCIN 2 % EX OINT
1.0000 "application " | TOPICAL_OINTMENT | Freq: Two times a day (BID) | CUTANEOUS | Status: DC
  Administered 2020-09-12 – 2020-09-17 (×9): 1 via NASAL
  Filled 2020-09-12 (×4): qty 22

## 2020-09-12 MED ORDER — CHLORHEXIDINE GLUCONATE CLOTH 2 % EX PADS: 6.0000 | MEDICATED_PAD | Freq: Every day | CUTANEOUS | Status: DC

## 2020-09-12 NOTE — Progress Notes (Signed)
PROGRESS NOTE    Michelle Skinner  LPF:[id number] DOB: [birth year] DOA: 09/06/2020 PCP: Monico Blitz, MD   Chief Complaint  Patient presents with  . Foot Pain    Brief admission narrative:  55 y.o. female with medical history significant of morbid obesity, DM2 on insulin w/ peripheral neuropathy, HTN, GERD. She reports that she incurred "bone bruises" to both feet several weeks ago which have progressed to open wounds. She had progressive weakness and marked pain in both feet. She presented to [facility] for evaluation.  Pt had initial debridement by Dr. Constance Haw at [facility] and subsequently transferred to [facility] for consult with Dr. Sharol Given.    Assessment & Plan: 1-Uncontrolled type 2 diabetes mellitus with complication, with long-term current use of insulin: Presented with DKA -DKA resolved after IV insulin infusion -increased lantus to 28 units BID plus SSI, increase prandial coverage to 15 units TID with meals -continue to follow CBG's trend -maintain adequate hydration -follow modified carb diet. CBG (last 3)  Recent Labs    09/11/20 1951 09/12/20 0641 09/12/20 1130  GLUCAP 196* 201* 221*    2-Sepsis secondary to bilateral MSSA Bacteremia, cellulitis of both feet and legs - RESOLVED -Appreciate ID consult, order TTE, continue IV cefazolin.   -General surgery and orthopedic service consulted (Dr. Sharol Given); will follow recommendations and further inputs as part of wound care/debridement.   Dr. Sharol Given reports patient likely will require bilateral transtibial amputation and CIR placement.  Consult to TOC.     3-Primary hypothyroidism -Continue Synthroid  4-Mixed hyperlipidemia -Continue statins  5-Essential hypertension, benign -Intermittent soft blood pressures, stable, asymptomatic.  -Continue holding hydralazine -continue adjusted dose of carvedilol -Close monitoring of patient vital signs.  6-acute on chronic renal failure; stage IIIa at baseline -Continue to minimize nephrotoxic agents,  hypotension and the use of contrast -Follow-up renal function trend -Cr trending down appropriately and towards baseline.   7-moderate obesity -Body mass index is 50.38 kg/m. -Low calorie diet and portion control discussed with patient.  8-depression -No suicidal ideation hallucination -Continue Prozac and trazodone.  9-gastroesophageal flux disease -Continue PPI.  Leukocytosis -WBC trending down.    DVT prophylaxis: Lovenox Code Status: Full code Family Communication: husband t/c 3/7  Disposition:  Anticipating CIR placement  Status is: Inpatient  Dispo: The patient is from: Home              Anticipated d/c is to: To be determined              Patient currently not medically stable for discharge; receiving treatment for DKA, sepsis and ongoing bilateral cellulitic process and feet wounds requiring IV antibiotics and surgical debridement. Patient will be transfer to [facility] for orthopedic evaluation and further wound care.    Difficult to place patient no   Consultants:   General surgery   Orthopedic service (Dr. Sharol Given)   Procedures:  See below for x-ray reports Incision, drainage and surgical debridement of bilateral feet wounds. 09/08/20   Antimicrobials:  Rocephin and vancomycin  Subjective: Pt seems to be more accepting about having to have amputation tomorrow.  No other complaints.    Objective: Vitals:   09/11/20 1525 09/11/20 1952 09/12/20 0434 09/12/20 0729  BP: 99/80 127/63 (!) 141/54 137/62  Pulse: 63 77 71 72  Resp: 14 17 18 17   Temp: 98.2 F (36.8 C) 98.1 F (36.7 C) 98.1 F (36.7 C) 98.1 F (36.7 C)  TempSrc: Oral   Oral  SpO2: 100% 100% 99% 98%  Weight:  Height:        Intake/Output Summary (Last 24 hours) at 09/12/2020 1318 Last data filed at 09/12/2020 0431 Gross per 24 hour  Intake 407.09 ml  Output 1300 ml  Net -892.91 ml   Filed Weights   09/06/20 1249 09/06/20 2200  Weight: (!) 158.8 kg (!) 148.1 kg    Examination: General  exam: awake, alert, NAD, cooperative.  Respiratory system: no increased work of breathing.  Cardiovascular system:normal s1, s2 sounds distant.   Gastrointestinal system: Abdomen is obese, nondistended, soft and nontender. No organomegaly or masses felt. Normal bowel sounds heard. Central nervous system: Alert and oriented. No focal neurological deficits. Extremities/skin: chronic lymphedema bilateral lower extremities, also with bilateral chronic stasis dermatitis, and ascending erythematous changes. Both feet in bandages clean, dry, intact.  Psychiatry: Judgement and insight appear poor. Mood & affect flat.    Data Reviewed: I have personally reviewed following labs and imaging studies  CBC: Recent Labs  Lab 09/06/20 1401 09/07/20 0622 09/11/20 0428 09/12/20 0239  WBC 25.4* 21.2* 15.9* 12.5*  NEUTROABS 22.1*  --  12.9* 9.7*  HGB 9.8* 8.8* 7.5* 7.6*  HCT 31.6* 27.3* 23.7* 23.7*  MCV 91.1 86.9 87.5 88.4  PLT 402* 353 289 161    Basic Metabolic Panel: Recent Labs  Lab 09/06/20 1401 09/07/20 0622 09/07/20 1126 09/11/20 0428 09/12/20 0239  NA 124* 131* 132* 132* 136  K 3.9 3.4* 3.3* 3.5 3.6  CL 88* 99 98 103 106  CO2 10* 21* 22 18* 22  GLUCOSE 591* 221* 192* 129* 186*  BUN 52* 52* 51* 36* 30*  CREATININE 1.94* 1.81* 1.79* 1.47* 1.41*  CALCIUM 8.8* 7.7* 7.8* 7.5* 7.5*  MG  --   --   --  2.0 2.0    GFR: Estimated Creatinine Clearance: 70.6 mL/min (A) (by C-G formula based on SCr of 1.41 mg/dL (H)).  Liver Function Tests: Recent Labs  Lab 09/06/20 1401 09/11/20 0428 09/12/20 0239  AST 12* 12* 12*  ALT 14 5 6   ALKPHOS 120 99 95  BILITOT 2.4* 0.7 0.6  PROT 7.0 5.8* 5.9*  ALBUMIN 1.6* <1.0* <1.0*    CBG: Recent Labs  Lab 09/11/20 1115 09/11/20 1650 09/11/20 1951 09/12/20 0641 09/12/20 1130  GLUCAP 117* 95 196* 201* 221*    Recent Results (from the past 240 hour(s))  Resp Panel by RT-PCR (Flu A&B, Covid) Nasopharyngeal Swab     Status: None   Collection  Time: 09/06/20  2:01 PM   Specimen: Nasopharyngeal Swab; Nasopharyngeal(NP) swabs in vial transport medium  Result Value Ref Range Status   SARS Coronavirus 2 by RT PCR NEGATIVE NEGATIVE Final    Comment: (NOTE) SARS-CoV-2 target nucleic acids are NOT DETECTED.  The SARS-CoV-2 RNA is generally detectable in upper respiratory specimens during the acute phase of infection. The lowest concentration of SARS-CoV-2 viral copies this assay can detect is 138 copies/mL. A negative result does not preclude SARS-Cov-2 infection and should not be used as the sole basis for treatment or other patient management decisions. A negative result may occur with  improper specimen collection/handling, submission of specimen other than nasopharyngeal swab, presence of viral mutation(s) within the areas targeted by this assay, and inadequate number of viral copies(<138 copies/mL). A negative result must be combined with clinical observations, patient history, and epidemiological information. The expected result is Negative.  Fact Sheet for Patients:  EntrepreneurPulse.com.au  Fact Sheet for Healthcare Providers:  IncredibleEmployment.be  This test is no t yet approved or cleared by  the [employer] and  has been authorized for detection and/or diagnosis of SARS-CoV-2 by FDA under an Emergency Use Authorization (EUA). This EUA will remain  in effect (meaning this test can be used) for the duration of the COVID-19 declaration under Section 564(b)(1) of the Act, 21 U.S.C.section 360bbb-3(b)(1), unless the authorization is terminated  or revoked sooner.       Influenza A by PCR NEGATIVE NEGATIVE Final   Influenza B by PCR NEGATIVE NEGATIVE Final    Comment: (NOTE) The Xpert Xpress SARS-CoV-2/FLU/RSV plus assay is intended as an aid in the diagnosis of influenza from Nasopharyngeal swab specimens and should not be used as a sole basis for treatment. Nasal washings  and aspirates are unacceptable for Xpert Xpress SARS-CoV-2/FLU/RSV testing.  Fact Sheet for Patients: EntrepreneurPulse.com.au  Fact Sheet for Healthcare Providers: IncredibleEmployment.be  This test is not yet approved or cleared by the [place] FDA and has been authorized for detection and/or diagnosis of SARS-CoV-2 by FDA under an Emergency Use Authorization (EUA). This EUA will remain in effect (meaning this test can be used) for the duration of the COVID-19 declaration under Section 564(b)(1) of the Act, 21 U.S.C. section 360bbb-3(b)(1), unless the authorization is terminated or revoked.  Performed at [facility], [address], [place], [place] [address]   Urine culture     Status: Abnormal   Collection Time: 09/06/20  2:01 PM   Specimen: In/Out Cath Urine  Result Value Ref Range Status   Specimen Description   Final    IN/OUT CATH URINE Performed at [facility], [address] [place], [place] [address]    Special Requests   Final    NONE Performed at [facility], [address] [place], [place] [address]    Culture (A)  Final    1,000 COLONIES/mL PROTEUS MIRABILIS 3,000 COLONIES/mL KLEBSIELLA PNEUMONIAE    Report Status 09/09/2020 FINAL  Final   Organism ID, Bacteria PROTEUS MIRABILIS (A)  Final   Organism ID, Bacteria KLEBSIELLA PNEUMONIAE (A)  Final      Susceptibility   Klebsiella pneumoniae - MIC*    AMPICILLIN RESISTANT Resistant     CEFAZOLIN <=4 SENSITIVE Sensitive     CEFEPIME <=0.12 SENSITIVE Sensitive     CEFTRIAXONE <=0.25 SENSITIVE Sensitive     CIPROFLOXACIN <=0.25 SENSITIVE Sensitive     GENTAMICIN <=1 SENSITIVE Sensitive     IMIPENEM <=0.25 SENSITIVE Sensitive     NITROFURANTOIN 32 SENSITIVE Sensitive     TRIMETH/SULFA <=20 SENSITIVE Sensitive     AMPICILLIN/SULBACTAM <=2 SENSITIVE Sensitive     PIP/TAZO <=4 SENSITIVE Sensitive     * 3,000 COLONIES/mL KLEBSIELLA PNEUMONIAE   Proteus mirabilis - MIC*     AMPICILLIN <=2 SENSITIVE Sensitive     CEFAZOLIN 8 SENSITIVE Sensitive     CEFEPIME <=0.12 SENSITIVE Sensitive     CEFTRIAXONE <=0.25 SENSITIVE Sensitive     CIPROFLOXACIN >=4 RESISTANT Resistant     GENTAMICIN <=1 SENSITIVE Sensitive     IMIPENEM 2 SENSITIVE Sensitive     NITROFURANTOIN 128 RESISTANT Resistant     TRIMETH/SULFA >=320 RESISTANT Resistant     AMPICILLIN/SULBACTAM <=2 SENSITIVE Sensitive     PIP/TAZO <=4 SENSITIVE Sensitive     * 1,000 COLONIES/mL PROTEUS MIRABILIS  Blood Culture (routine x 2)     Status: Abnormal   Collection Time: 09/06/20  2:14 PM   Specimen: BLOOD  Result Value Ref Range Status   Specimen Description   Final    BLOOD LEFT  ANTECUBITAL Performed at [facility], [address], [place], [place] [address]    Special Requests   Final    BOTTLES DRAWN AEROBIC AND ANAEROBIC Blood Culture adequate volume Performed at [facility], [address] [place], [place] [address]    Culture  Setup Time   Final    GRAM POSITIVE COCCI IN BOTH AEROBIC AND ANAEROBIC BOTTLES Gram Stain Report Called to,Read Back By and Verified With: LARIMORE,[email or web address]  BY MATTHEWS, B 3.3.2022 Performed at [facility] ID to follow CRITICAL RESULT CALLED TO, READ BACK BY AND VERIFIED WITH: [place] 09/07/20 A BROWNING Performed at [facility], [place] [address], [place], [place] [address]    Culture STAPHYLOCOCCUS AUREUS (A)  Final   Report Status 09/09/2020 FINAL  Final   Organism ID, Bacteria STAPHYLOCOCCUS AUREUS  Final      Susceptibility   Staphylococcus aureus - MIC*    CIPROFLOXACIN <=0.5 SENSITIVE Sensitive     ERYTHROMYCIN <=0.25 SENSITIVE Sensitive     GENTAMICIN <=0.5 SENSITIVE Sensitive     OXACILLIN <=0.25 SENSITIVE Sensitive     TETRACYCLINE <=1 SENSITIVE Sensitive     VANCOMYCIN <=0.5 SENSITIVE Sensitive     TRIMETH/SULFA <=10 SENSITIVE Sensitive     CLINDAMYCIN <=0.25 SENSITIVE Sensitive     RIFAMPIN <=0.5 SENSITIVE Sensitive      Inducible Clindamycin NEGATIVE Sensitive     * STAPHYLOCOCCUS AUREUS  Blood Culture ID Panel (Reflexed)     Status: Abnormal   Collection Time: 09/06/20  2:14 PM  Result Value Ref Range Status   Enterococcus faecalis NOT DETECTED NOT DETECTED Final   Enterococcus Faecium NOT DETECTED NOT DETECTED Final   Listeria monocytogenes NOT DETECTED NOT DETECTED Final   Staphylococcus species DETECTED (A) NOT DETECTED Final    Comment: CRITICAL RESULT CALLED TO, READ BACK BY AND VERIFIED WITH: [place] Majestic RN [id number] 09/07/20 A BROWNING    Staphylococcus aureus (BCID) DETECTED (A) NOT DETECTED Final    Comment: CRITICAL RESULT CALLED TO, READ BACK BY AND VERIFIED WITH: [place] Majestic RN [phone] 09/07/20 A BROWNING    Staphylococcus epidermidis NOT DETECTED NOT DETECTED Final   Staphylococcus lugdunensis NOT DETECTED NOT DETECTED Final   Streptococcus species NOT DETECTED NOT DETECTED Final   Streptococcus agalactiae NOT DETECTED NOT DETECTED Final   Streptococcus pneumoniae NOT DETECTED NOT DETECTED Final   Streptococcus pyogenes NOT DETECTED NOT DETECTED Final   A.calcoaceticus-baumannii NOT DETECTED NOT DETECTED Final   Bacteroides fragilis NOT DETECTED NOT DETECTED Final   Enterobacterales NOT DETECTED NOT DETECTED Final   Enterobacter cloacae complex NOT DETECTED NOT DETECTED Final   Escherichia coli NOT DETECTED NOT DETECTED Final   Klebsiella aerogenes NOT DETECTED NOT DETECTED Final   Klebsiella oxytoca NOT DETECTED NOT DETECTED Final   Klebsiella pneumoniae NOT DETECTED NOT DETECTED Final   Proteus species NOT DETECTED NOT DETECTED Final   Salmonella species NOT DETECTED NOT DETECTED Final   Serratia marcescens NOT DETECTED NOT DETECTED Final   Haemophilus influenzae NOT DETECTED NOT DETECTED Final   Neisseria meningitidis NOT DETECTED NOT DETECTED Final   Pseudomonas aeruginosa NOT DETECTED NOT DETECTED Final   Stenotrophomonas maltophilia NOT DETECTED NOT DETECTED Final   Candida albicans NOT  DETECTED NOT DETECTED Final   Candida auris NOT DETECTED NOT DETECTED Final   Candida glabrata NOT DETECTED NOT DETECTED Final   Candida krusei NOT DETECTED NOT DETECTED Final   Candida parapsilosis NOT DETECTED NOT DETECTED Final   Candida tropicalis NOT DETECTED  NOT DETECTED Final   Cryptococcus neoformans/gattii NOT DETECTED NOT DETECTED Final   Meth resistant mecA/C and MREJ NOT DETECTED NOT DETECTED Final    Comment: Performed at [facility], [place] [address], [place], [place] [address]  Blood Culture (routine x 2)     Status: None   Collection Time: 09/06/20  3:59 PM   Specimen: BLOOD RIGHT HAND  Result Value Ref Range Status   Specimen Description BLOOD RIGHT HAND  Final   Special Requests   Final    BOTTLES DRAWN AEROBIC AND ANAEROBIC Blood Culture adequate volume   Culture   Final    NO GROWTH 5 DAYS Performed at [facility], [address], [place], [place] [address]    Report Status 09/11/2020 FINAL  Final  MRSA PCR Screening     Status: None   Collection Time: 09/06/20  9:46 PM   Specimen: Nasal Mucosa; Nasopharyngeal  Result Value Ref Range Status   MRSA by PCR NEGATIVE NEGATIVE Final    Comment:        The GeneXpert MRSA Assay (FDA approved for NASAL specimens only), is one component of a comprehensive MRSA colonization surveillance program. It is not intended to diagnose MRSA infection nor to guide or monitor treatment for MRSA infections. Performed at [facility], [address] [place], [place] [address]   Aerobic/Anaerobic Culture w Gram Stain (surgical/deep wound)     Status: None (Preliminary result)   Collection Time: 09/08/20 11:04 AM   Specimen: PATH Other; Tissue  Result Value Ref Range Status   Specimen Description   Final    FOOT RIGHT Performed at [facility], [address], [place], [place] [address]    Special Requests   Final    NONE Performed at [facility], [address], [place], [place] [address]    Gram Stain   Final     RARE WBC PRESENT, PREDOMINANTLY PMN MODERATE GRAM POSITIVE COCCI IN PAIRS IN CLUSTERS Performed at [facility], [place] [address] [place], [place] [address]    Culture   Final    ABUNDANT STAPHYLOCOCCUS AUREUS MODERATE ENTEROCOCCUS FAECALIS NO ANAEROBES ISOLATED; CULTURE IN PROGRESS FOR 5 DAYS    Report Status PENDING  Incomplete   Organism ID, Bacteria STAPHYLOCOCCUS AUREUS  Final   Organism ID, Bacteria ENTEROCOCCUS FAECALIS  Final      Susceptibility   Enterococcus faecalis - MIC*    AMPICILLIN <=2 SENSITIVE Sensitive     VANCOMYCIN 1 SENSITIVE Sensitive     GENTAMICIN SYNERGY SENSITIVE Sensitive     * MODERATE ENTEROCOCCUS FAECALIS   Staphylococcus aureus - MIC*    CIPROFLOXACIN <=0.5 SENSITIVE Sensitive     ERYTHROMYCIN <=0.25 SENSITIVE Sensitive     GENTAMICIN <=0.5 SENSITIVE Sensitive     OXACILLIN 0.5 SENSITIVE Sensitive     TETRACYCLINE <=1 SENSITIVE Sensitive     VANCOMYCIN <=0.5 SENSITIVE Sensitive     TRIMETH/SULFA <=10 SENSITIVE Sensitive     CLINDAMYCIN <=0.25 SENSITIVE Sensitive     RIFAMPIN <=0.5 SENSITIVE Sensitive     Inducible Clindamycin NEGATIVE Sensitive     * ABUNDANT STAPHYLOCOCCUS AUREUS  Aerobic/Anaerobic Culture w Gram Stain (surgical/deep wound)     Status: None (Preliminary result)   Collection Time: 09/08/20 11:45 AM   Specimen: PATH Other; Tissue  Result Value Ref Range Status   Specimen Description   Final    FOOT RIGHT Performed at [facility], [address], [place],  [address]    Special  Requests   Final    NONE Performed at [facility], [address] [place], [place] [address]    Gram Stain   Final    FEW WBC PRESENT, PREDOMINANTLY PMN MODERATE GRAM POSITIVE COCCI IN PAIRS IN CLUSTERS RARE GRAM NEGATIVE RODS Performed at [facility], [place] [address], [place], [place] [address]    Culture   Final    MODERATE STAPHYLOCOCCUS AUREUS RARE ENTEROCOCCUS FAECALIS RARE ENTEROBACTER CLOACAE NO ANAEROBES ISOLATED;  CULTURE IN PROGRESS FOR 5 DAYS    Report Status PENDING  Incomplete   Organism ID, Bacteria STAPHYLOCOCCUS AUREUS  Final   Organism ID, Bacteria ENTEROCOCCUS FAECALIS  Final   Organism ID, Bacteria ENTEROBACTER CLOACAE  Final      Susceptibility   Enterobacter cloacae - MIC*    CEFAZOLIN >=64 RESISTANT Resistant     CEFEPIME <=0.12 SENSITIVE Sensitive     CEFTAZIDIME <=1 SENSITIVE Sensitive     CIPROFLOXACIN 1 SENSITIVE Sensitive     GENTAMICIN <=1 SENSITIVE Sensitive     IMIPENEM 0.5 SENSITIVE Sensitive     TRIMETH/SULFA <=20 SENSITIVE Sensitive     PIP/TAZO <=4 SENSITIVE Sensitive     * RARE ENTEROBACTER CLOACAE   Enterococcus faecalis - MIC*    AMPICILLIN <=2 SENSITIVE Sensitive     VANCOMYCIN 1 SENSITIVE Sensitive     GENTAMICIN SYNERGY SENSITIVE Sensitive     * RARE ENTEROCOCCUS FAECALIS   Staphylococcus aureus - MIC*    CIPROFLOXACIN <=0.5 SENSITIVE Sensitive     ERYTHROMYCIN <=0.25 SENSITIVE Sensitive     GENTAMICIN <=0.5 SENSITIVE Sensitive     OXACILLIN <=0.25 SENSITIVE Sensitive     TETRACYCLINE <=1 SENSITIVE Sensitive     VANCOMYCIN <=0.5 SENSITIVE Sensitive     TRIMETH/SULFA <=10 SENSITIVE Sensitive     CLINDAMYCIN <=0.25 SENSITIVE Sensitive     RIFAMPIN <=0.5 SENSITIVE Sensitive     Inducible Clindamycin NEGATIVE Sensitive     * MODERATE STAPHYLOCOCCUS AUREUS  Aerobic/Anaerobic Culture w Gram Stain (surgical/deep wound)     Status: None (Preliminary result)   Collection Time: 09/08/20 11:46 AM   Specimen: PATH Other; Tissue  Result Value Ref Range Status   Specimen Description FOOT LEFT  Final   Special Requests NONE  Final   Gram Stain   Final    RARE WBC PRESENT, PREDOMINANTLY PMN ABUNDANT GRAM POSITIVE COCCI IN CLUSTERS    Culture   Final    ABUNDANT ENTEROCOCCUS FAECALIS MODERATE STAPHYLOCOCCUS AUREUS SUSCEPTIBILITIES TO FOLLOW WITHIN MIXED ORGANISMS    Report Status PENDING  Incomplete   Organism ID, Bacteria STAPHYLOCOCCUS AUREUS  Final       Susceptibility   Staphylococcus aureus - MIC*    CIPROFLOXACIN <=0.5 SENSITIVE Sensitive     ERYTHROMYCIN <=0.25 SENSITIVE Sensitive     GENTAMICIN <=0.5 SENSITIVE Sensitive     OXACILLIN 0.5 SENSITIVE Sensitive     TETRACYCLINE <=1 SENSITIVE Sensitive     VANCOMYCIN <=0.5 SENSITIVE Sensitive     TRIMETH/SULFA <=10 SENSITIVE Sensitive     CLINDAMYCIN <=0.25 SENSITIVE Sensitive     RIFAMPIN <=0.5 SENSITIVE Sensitive     Inducible Clindamycin Value in next row Sensitive      NEGATIVEPerformed at [facility], [address], [place], [place] [address]    * MODERATE STAPHYLOCOCCUS AUREUS  Aerobic/Anaerobic Culture w Gram Stain (surgical/deep wound)     Status: None (Preliminary result)   Collection Time: 09/10/20  8:25 AM   Specimen: Foot, Left; Tissue  Result Value  Ref Range Status   Specimen Description TISSUE LEFT FOOT ULCER SPEC A  Final   Special Requests LEFT FOOT ULCER  Final   Gram Stain   Final    RARE WBC PRESENT, PREDOMINANTLY PMN ABUNDANT GRAM POSITIVE COCCI RARE GRAM NEGATIVE RODS    Culture   Final    ABUNDANT STAPHYLOCOCCUS AUREUS FEW STENOTROPHOMONAS MALTOPHILIA CULTURE REINCUBATED FOR BETTER GROWTH    Report Status PENDING  Incomplete   Organism ID, Bacteria STAPHYLOCOCCUS AUREUS  Final   Organism ID, Bacteria STENOTROPHOMONAS MALTOPHILIA  Final      Susceptibility   Staphylococcus aureus - MIC*    CIPROFLOXACIN <=0.5 SENSITIVE Sensitive     ERYTHROMYCIN <=0.25 SENSITIVE Sensitive     GENTAMICIN <=0.5 SENSITIVE Sensitive     OXACILLIN 0.5 SENSITIVE Sensitive     TETRACYCLINE <=1 SENSITIVE Sensitive     VANCOMYCIN <=0.5 SENSITIVE Sensitive     TRIMETH/SULFA <=10 SENSITIVE Sensitive     CLINDAMYCIN <=0.25 SENSITIVE Sensitive     RIFAMPIN <=0.5 SENSITIVE Sensitive     Inducible Clindamycin Value in next row Sensitive      NEGATIVEPerformed at [facility] [address], [place], [place] [address]    * ABUNDANT STAPHYLOCOCCUS AUREUS    Stenotrophomonas maltophilia - MIC*    LEVOFLOXACIN Value in next row Sensitive      NEGATIVEPerformed at [facility] [address], [place], [place] [address]    TRIMETH/SULFA Value in next row Sensitive      NEGATIVEPerformed at [place] [address], [place], [place] [address]    * FEW STENOTROPHOMONAS MALTOPHILIA  Culture, blood (routine x 2)     Status: None (Preliminary result)   Collection Time: 09/11/20  4:28 AM   Specimen: BLOOD LEFT HAND  Result Value Ref Range Status   Specimen Description BLOOD LEFT HAND  Final   Special Requests   Final    BOTTLES DRAWN AEROBIC ONLY Blood Culture results may not be optimal due to an inadequate volume of blood received in culture bottles   Culture   Final    NO GROWTH < 12 HOURS Performed at [facility], [place] [address] [place], [place] [address]    Report Status PENDING  Incomplete  Culture, blood (routine x 2)     Status: None (Preliminary result)   Collection Time: 09/11/20  4:37 AM   Specimen: BLOOD  Result Value Ref Range Status   Specimen Description BLOOD LEFT ANTECUBITAL  Final   Special Requests   Final    BOTTLES DRAWN AEROBIC ONLY Blood Culture results may not be optimal due to an inadequate volume of blood received in culture bottles   Culture   Final    NO GROWTH < 12 HOURS Performed at [facility], [place] [address], [place], [place] [address]    Report Status PENDING  Incomplete     Radiology Studies: ECHOCARDIOGRAM COMPLETE  Result Date: 09/10/2020    ECHOCARDIOGRAM REPORT   Patient Name:   Michelle Skinner Date of Exam: 09/10/2020 Medical Rec #:  [id number]      Height:       67.5 in Accession #:    [id number]     Weight:       326.5 lb Date of Birth:  May 11, 1966     BSA:          2.504 m Patient Age:    6 years  BP:           128/57 mmHg Patient Gender: F              HR:           73 bpm. Exam Location:  Inpatient Procedure: 2D Echo, Cardiac Doppler and Color Doppler Indications:     Bacteremia R78.81  History:        Patient has no prior history of Echocardiogram examinations.                 Risk Factors:Diabetes and Dyslipidemia. Cancer. GERD.  Sonographer:    Jonelle Sidle Dance Referring Phys: [place]  1. Left ventricular ejection fraction, by estimation, is 55 to 60%. The left ventricle has normal function. The left ventricle has no regional wall motion abnormalities. There is mild concentric left ventricular hypertrophy. Left ventricular diastolic parameters were normal.  2. Right ventricular systolic function is normal. The right ventricular size is normal. Tricuspid regurgitation signal is inadequate for assessing PA pressure.  3. The mitral valve is normal in structure. Trivial mitral valve regurgitation. No evidence of mitral stenosis.  4. The aortic valve is grossly normal. There is mild calcification of the aortic valve. Aortic valve regurgitation is not visualized. No aortic stenosis is present.  5. The inferior vena cava is dilated in size with >50% respiratory variability, suggesting right atrial pressure of 8 mmHg. Comparison(s): No prior Echocardiogram. Conclusion(s)/Recommendation(s): Normal biventricular function without evidence of hemodynamically significant valvular heart disease. No evidence of valvular vegetations on this transthoracic echocardiogram. Would recommend a transesophageal echocardiogram to exclude infective endocarditis if clinically indicated. FINDINGS  Left Ventricle: Left ventricular ejection fraction, by estimation, is 55 to 60%. The left ventricle has normal function. The left ventricle has no regional wall motion abnormalities. The left ventricular internal cavity size was normal in size. There is  mild concentric left ventricular hypertrophy. Left ventricular diastolic parameters were normal. Right Ventricle: The right ventricular size is normal. No increase in right ventricular wall thickness. Right ventricular systolic function is  normal. Tricuspid regurgitation signal is inadequate for assessing PA pressure. Left Atrium: Left atrial size was normal in size. Right Atrium: Right atrial size was normal in size. Pericardium: Trivial pericardial effusion is present. Mitral Valve: The mitral valve is normal in structure. Mild to moderate mitral annular calcification. Trivial mitral valve regurgitation. No evidence of mitral valve stenosis. Tricuspid Valve: The tricuspid valve is normal in structure. Tricuspid valve regurgitation is trivial. No evidence of tricuspid stenosis. Aortic Valve: The aortic valve is grossly normal. There is mild calcification of the aortic valve. Aortic valve regurgitation is not visualized. No aortic stenosis is present. Pulmonic Valve: The pulmonic valve was not well visualized. Pulmonic valve regurgitation is not visualized. Aorta: The aortic root, ascending aorta, aortic arch and descending aorta are all structurally normal, with no evidence of dilitation or obstruction. Venous: The inferior vena cava is dilated in size with greater than 50% respiratory variability, suggesting right atrial pressure of 8 mmHg. IAS/Shunts: The atrial septum is grossly normal.  LEFT VENTRICLE PLAX 2D LVIDd:         4.90 cm  Diastology LVIDs:         3.10 cm  LV e' medial:    6.64 cm/s LV PW:         1.20 cm  LV E/e' medial:  17.0 LV IVS:        1.25 cm  LV e' lateral:   11.20 cm/s  LVOT diam:     1.70 cm  LV E/e' lateral: 10.1 LV SV:         61 LV SV Index:   24 LVOT Area:     2.27 cm  RIGHT VENTRICLE             IVC RV Basal diam:  2.90 cm     IVC diam: 2.90 cm RV S prime:     12.70 cm/s TAPSE (M-mode): 2.3 cm LEFT ATRIUM             Index       RIGHT ATRIUM           Index LA diam:        4.10 cm 1.64 cm/m  RA Area:     15.20 cm LA Vol (A2C):   72.9 ml 29.11 ml/m RA Volume:   38.30 ml  15.29 ml/m LA Vol (A4C):   64.3 ml 25.68 ml/m LA Biplane Vol: 70.8 ml 28.27 ml/m  AORTIC VALVE LVOT Vmax:   122.00 cm/s LVOT Vmean:  83.900 cm/s  LVOT VTI:    0.268 m  AORTA Ao Root diam: 3.00 cm Ao Asc diam:  3.50 cm MITRAL VALVE MV Area (PHT): 2.83 cm     SHUNTS MV Decel Time: 268 msec     Systemic VTI:  0.27 m MV E velocity: 113.00 cm/s  Systemic Diam: 1.70 cm MV A velocity: 85.70 cm/s MV E/A ratio:  1.32 Buford Dresser MD Electronically signed by Buford Dresser MD Signature Date/Time: 09/10/2020/4:13:43 PM    Final     Scheduled Meds: . atorvastatin  40 mg Oral QHS  . Chlorhexidine Gluconate Cloth  6 each Topical Daily  . cholecalciferol  5,000 Units Oral Daily  . docusate sodium  100 mg Oral BID  . enoxaparin (LOVENOX) injection  70 mg Subcutaneous Q24H  . FLUoxetine  40 mg Oral Daily  . insulin aspart  0-20 Units Subcutaneous TID WC  . insulin aspart  15 Units Subcutaneous TID WC  . insulin glargine  28 Units Subcutaneous BID  . levothyroxine  150 mcg Oral QAC breakfast  . magnesium oxide  200 mg Oral Daily  . metoprolol succinate  100 mg Oral QHS  . pantoprazole  40 mg Oral Daily  . senna  1 tablet Oral BID   Continuous Infusions: . sodium chloride 250 mL (09/09/20 2256)  . sodium chloride    . ampicillin-sulbactam (UNASYN) IV 3 g (09/12/20 1018)  . [START ON 09/13/2020]  ceFAZolin (ANCEF) IV    . methocarbamol (ROBAXIN) IV       LOS: 6 days   Time spent: 35 minutes  Merridith Dershem Wynetta Emery, MD How to contact the [facility] Attending or Consulting provider [place] or covering provider during after hours [place], for this patient?  1. Check the care team in [facility] and look for a) attending/consulting [facility] provider listed and b) the [facility] team listed 2. Log into [email or web address] and use [place]'s universal password to access. If you do not have the password, please contact the hospital operator. 3. Locate the [facility] provider you are looking for under [facility] and page to a number that you can be directly reached. 4. If you still have difficulty reaching the provider, please page the [facility] (Director on Call) for the  Hospitalists listed on amion for assistance.   09/12/2020, 1:18 PM

## 2020-09-12 NOTE — Progress Notes (Signed)
Physical Therapy Treatment Patient Details Name: Michelle Skinner MRN: [id number] DOB: 1965/10/22 Today's Date: 09/12/2020    History of Present Illness Pt is a 55 y.o. female who presented to the ED 09/06/20 with open wounds at bil feet. Per chart, 3 weeks PTA she sustained a "bone bruise" of her R lateral foot that progressed to increased redness and pain and later spread to her L foot. Pt found to be septic 2/2 cellulitis involving feet with lactic acidosis, leukocytosism, AKI, tachycardia, and tachypnea. S/p sharp debridement of wounds 09/08/20 and irrigation with debridement 09/10/20. Possible need for amputations. PMH: morbid obesity, DM2 on insulin w/ peripheral neuropathy, HTN, GERD, and cervical cancer.    PT Comments    Pt was seen for BLE ROM with assist on side of bed after gently assisting her legs off the bed.  Her legs were protected with pillows to avoid pressure, and pt is having drainage with pain on BLE's.  Even so, she is motivated and working to increase independence with bed mob and working toward lateral scoot transfers as her pain allows.  Will await the decisions and results of surgery schedule tomorrow and may upgrade her dc recommendation to CIR if appropriate.   Follow Up Recommendations  Other (comment) (CIR upgrade if amputations are done)     Equipment Recommendations  Rolling walker with 5" wheels;3in1 (PT);Wheelchair (measurements PT);Wheelchair cushion (measurements PT)    Recommendations for Other Services       Precautions / Restrictions Precautions Precautions: Fall Precaution Comments: NWB bil legs Restrictions Weight Bearing Restrictions: Yes Other Position/Activity Restrictions: NWB Bil lower extremities    Mobility  Bed Mobility Overal bed mobility: Needs Assistance Bed Mobility: Supine to Sit Rolling: Min assist   Supine to sit: Min assist     General bed mobility comments: min assist to support legs on nonpainful locations    Transfers                  General transfer comment: deferred  Ambulation/Gait                 Stairs             Wheelchair Mobility    Modified Rankin (Stroke Patients Only)       Balance Overall balance assessment: Needs assistance Sitting-balance support: Bilateral upper extremity supported;Feet unsupported Sitting balance-Leahy Scale: Fair Sitting balance - Comments: Bil UEs with unsquared hips sitting EOB statically, >10 min. Supervision. Postural control: Posterior lean                                  Cognition Arousal/Alertness: Awake/alert Behavior During Therapy: WFL for tasks assessed/performed Overall Cognitive Status: Within Functional Limits for tasks assessed                                        Exercises      General Comments General comments (skin integrity, edema, etc.): pt is on side of bed with feet on pillows, mild drainage on feet from her wounds.  Pt is in less pain with pillows protecting feet, but is sore.      Pertinent Vitals/Pain Pain Assessment: Faces Faces Pain Scale: Hurts little more Pain Location: bil feet Pain Descriptors / Indicators: Guarding;Grimacing Pain Intervention(s): Limited activity within patient's tolerance;Monitored during session;Premedicated before session;Repositioned  Home Living                      Prior Function            PT Goals (current goals can now be found in the care plan section) Acute Rehab PT Goals Patient Stated Goal: to get home    Frequency    Min 2X/week      PT Plan Current plan remains appropriate    Co-evaluation              AM-PAC PT "6 Clicks" Mobility   Outcome Measure  Help needed turning from your back to your side while in a flat bed without using bedrails?: A Lot Help needed moving from lying on your back to sitting on the side of a flat bed without using bedrails?: A Lot Help needed moving to and from a bed to a  chair (including a wheelchair)?: A Lot Help needed standing up from a chair using your arms (e.g., wheelchair or bedside chair)?: Total Help needed to walk in hospital room?: Total Help needed climbing 3-5 steps with a railing? : Total 6 Click Score: 9    End of Session Equipment Utilized During Treatment: Gait belt Activity Tolerance: Patient limited by pain;Treatment limited secondary to medical complications (Comment) Patient left: in bed;with call bell/phone within reach Nurse Communication: Mobility status PT Visit Diagnosis: Muscle weakness (generalized) (M62.81);Difficulty in walking, not elsewhere classified (R26.2);Pain     Time: 2395-3202 PT Time Calculation (min) (ACUTE ONLY): 31 min  Charges:  $Therapeutic Exercise: 8-22 mins $Therapeutic Activity: 8-22 mins                   Ramond Dial 09/12/2020, 2:06 PM  Mee Hives, PT MS Acute Rehab Dept. Number: [place]

## 2020-09-12 NOTE — Progress Notes (Addendum)
[facility]  Date of Admission:  09/06/2020     Total days of antibiotics 7         ASSESSMENT:  Ms. Alia surgical specimens are now growing stent of Stenotrophomonas maltophilia in addition to previously identified MSSA. Scheduled for bilateral below knee amputations tomorrow. Continue current dose of cefazolin. Will hold off on treating Stenotrophomonas as given surgical interventions tomorrow. Will need extended course of Cefazolin secondary to previous bacteremia.   PLAN:  1. Continue Cefazolin.  2. Planned bilateral BKA tomorrow.  3. Hold Stenotrophomonas coverage given upcoming surgery.  4. Wound care per orthopedics.   Principal Problem:   Plantar ulcer of right foot ([place]) Active Problems:   Uncontrolled type 2 diabetes mellitus with complication, with long-term current use of insulin (HCC)   Morbid (severe) obesity due to excess calories ([place])   Primary hypothyroidism   Mixed hyperlipidemia   Essential hypertension, benign   Depression   GERD (gastroesophageal reflux disease)   Cellulitis   Sepsis ([place])   Plantar ulcer of left foot (HCC)   Lymphedema   Severe protein-calorie malnutrition (HCC)   BMI 50.0-59.9, adult ([place])   MSSA bacteremia   Acute hematogenous osteomyelitis of both feet ([place])   . atorvastatin  40 mg Oral QHS  . Chlorhexidine Gluconate Cloth  6 each Topical Daily  . cholecalciferol  5,000 Units Oral Daily  . docusate sodium  100 mg Oral BID  . enoxaparin (LOVENOX) injection  70 mg Subcutaneous Q24H  . FLUoxetine  40 mg Oral Daily  . insulin aspart  0-20 Units Subcutaneous TID WC  . insulin aspart  15 Units Subcutaneous TID WC  . insulin glargine  28 Units Subcutaneous BID  . levothyroxine  150 mcg Oral QAC breakfast  . magnesium oxide  200 mg Oral Daily  . metoprolol succinate  100 mg Oral QHS  . pantoprazole  40 mg Oral Daily  . senna  1 tablet Oral BID    SUBJECTIVE:  Afebrile overnight with no acute  events. No new concerns/complaints.   Allergies  Allergen Reactions  . Sulfa Antibiotics Diarrhea, Nausea And Vomiting and Rash  . Clindamycin/Lincomycin Diarrhea and Nausea And Vomiting  . Invokana [Canagliflozin] Diarrhea and Nausea And Vomiting  . Latex     Rash, itching, over a long period of time  . Tape Itching and Swelling    Reaction after a few days use/ please use paper tape  . Bactrim [Sulfamethoxazole-Trimethoprim] Diarrhea, Nausea And Vomiting and Rash  . Cherry Itching, Swelling and Rash    Throat swelling  . Gabapentin Diarrhea, Nausea And Vomiting and Rash  . Other Itching, Swelling and Rash    Reaction to Hot peppers (throat swelling)     Review of Systems: Review of Systems  Constitutional: Negative for chills, fever and weight loss.  Respiratory: Negative for cough, shortness of breath and wheezing.   Cardiovascular: Positive for leg swelling. Negative for chest pain.  Gastrointestinal: Negative for abdominal pain, constipation, diarrhea, nausea and vomiting.  Skin: Negative for rash.      OBJECTIVE: Vitals:   09/11/20 1525 09/11/20 1952 09/12/20 0434 09/12/20 0729  BP: 99/80 127/63 (!) 141/54 137/62  Pulse: 63 77 71 72  Resp: 14 17 18 17   Temp: 98.2 F (36.8 C) 98.1 F (36.7 C) 98.1 F (36.7 C) 98.1 F (36.7 C)  TempSrc: Oral   Oral  SpO2: 100% 100% 99% 98%  Weight:      Height:  Body mass index is 50.38 kg/m.  Physical Exam Constitutional:      General: She is not in acute distress.    Appearance: She is well-developed and well-nourished.     Comments: Lying in bed with head of bed elevated; pleasant.   Cardiovascular:     Rate and Rhythm: Normal rate and regular rhythm.     Pulses: Intact distal pulses.     Heart sounds: Normal heart sounds.  Pulmonary:     Effort: Pulmonary effort is normal.     Breath sounds: Normal breath sounds.  Skin:    General: Skin is warm and dry.  Neurological:     Mental Status: She is alert and  oriented to person, place, and time.  Psychiatric:        Mood and Affect: Mood and affect normal.     Lab Results Lab Results  Component Value Date   WBC 12.5 (H) 09/12/2020   HGB 7.6 (L) 09/12/2020   HCT 23.7 (L) 09/12/2020   MCV 88.4 09/12/2020   PLT 296 09/12/2020    Lab Results  Component Value Date   CREATININE 1.41 (H) 09/12/2020   BUN 30 (H) 09/12/2020   NA 136 09/12/2020   K 3.6 09/12/2020   CL 106 09/12/2020   CO2 22 09/12/2020    Lab Results  Component Value Date   ALT 6 09/12/2020   AST 12 (L) 09/12/2020   ALKPHOS 95 09/12/2020   BILITOT 0.6 09/12/2020     Microbiology: Recent Results (from the past 240 hour(s))  Resp Panel by RT-PCR (Flu A&B, Covid) Nasopharyngeal Swab     Status: None   Collection Time: 09/06/20  2:01 PM   Specimen: Nasopharyngeal Swab; Nasopharyngeal(NP) swabs in vial transport medium  Result Value Ref Range Status   SARS Coronavirus 2 by RT PCR NEGATIVE NEGATIVE Final    Comment: (NOTE) SARS-CoV-2 target nucleic acids are NOT DETECTED.  The SARS-CoV-2 RNA is generally detectable in upper respiratory specimens during the acute phase of infection. The lowest concentration of SARS-CoV-2 viral copies this assay can detect is 138 copies/mL. A negative result does not preclude SARS-Cov-2 infection and should not be used as the sole basis for treatment or other patient management decisions. A negative result may occur with  improper specimen collection/handling, submission of specimen other than nasopharyngeal swab, presence of viral mutation(s) within the areas targeted by this assay, and inadequate number of viral copies(<138 copies/mL). A negative result must be combined with clinical observations, patient history, and epidemiological information. The expected result is Negative.  Fact Sheet for Patients:  EntrepreneurPulse.com.au  Fact Sheet for Healthcare Providers:   IncredibleEmployment.be  This test is no t yet approved or cleared by the [place] FDA and  has been authorized for detection and/or diagnosis of SARS-CoV-2 by FDA under an Emergency Use Authorization (EUA). This EUA will remain  in effect (meaning this test can be used) for the duration of the COVID-19 declaration under Section 564(b)(1) of the Act, 21 U.S.C.section 360bbb-3(b)(1), unless the authorization is terminated  or revoked sooner.       Influenza A by PCR NEGATIVE NEGATIVE Final   Influenza B by PCR NEGATIVE NEGATIVE Final    Comment: (NOTE) The Xpert Xpress SARS-CoV-2/FLU/RSV plus assay is intended as an aid in the diagnosis of influenza from Nasopharyngeal swab specimens and should not be used as a sole basis for treatment. Nasal washings and aspirates are unacceptable for Xpert Xpress SARS-CoV-2/FLU/RSV testing.  Fact Sheet for  Patients: EntrepreneurPulse.com.au  Fact Sheet for Healthcare Providers: IncredibleEmployment.be  This test is not yet approved or cleared by the [place] FDA and has been authorized for detection and/or diagnosis of SARS-CoV-2 by FDA under an Emergency Use Authorization (EUA). This EUA will remain in effect (meaning this test can be used) for the duration of the COVID-19 declaration under Section 564(b)(1) of the Act, 21 U.S.C. section 360bbb-3(b)(1), unless the authorization is terminated or revoked.  Performed at [facility], [address] [place], [place] [address]   Urine culture     Status: Abnormal   Collection Time: 09/06/20  2:01 PM   Specimen: In/Out Cath Urine  Result Value Ref Range Status   Specimen Description   Final    IN/OUT CATH URINE Performed at [facility], [address] [place], [place] [address]    Special Requests   Final    NONE Performed at [facility], [address], [place], [place] [address]    Culture (A)  Final    1,000 COLONIES/mL  PROTEUS MIRABILIS 3,000 COLONIES/mL KLEBSIELLA PNEUMONIAE    Report Status 09/09/2020 FINAL  Final   Organism ID, Bacteria PROTEUS MIRABILIS (A)  Final   Organism ID, Bacteria KLEBSIELLA PNEUMONIAE (A)  Final      Susceptibility   Klebsiella pneumoniae - MIC*    AMPICILLIN RESISTANT Resistant     CEFAZOLIN <=4 SENSITIVE Sensitive     CEFEPIME <=0.12 SENSITIVE Sensitive     CEFTRIAXONE <=0.25 SENSITIVE Sensitive     CIPROFLOXACIN <=0.25 SENSITIVE Sensitive     GENTAMICIN <=1 SENSITIVE Sensitive     IMIPENEM <=0.25 SENSITIVE Sensitive     NITROFURANTOIN 32 SENSITIVE Sensitive     TRIMETH/SULFA <=20 SENSITIVE Sensitive     AMPICILLIN/SULBACTAM <=2 SENSITIVE Sensitive     PIP/TAZO <=4 SENSITIVE Sensitive     * 3,000 COLONIES/mL KLEBSIELLA PNEUMONIAE   Proteus mirabilis - MIC*    AMPICILLIN <=2 SENSITIVE Sensitive     CEFAZOLIN 8 SENSITIVE Sensitive     CEFEPIME <=0.12 SENSITIVE Sensitive     CEFTRIAXONE <=0.25 SENSITIVE Sensitive     CIPROFLOXACIN >=4 RESISTANT Resistant     GENTAMICIN <=1 SENSITIVE Sensitive     IMIPENEM 2 SENSITIVE Sensitive     NITROFURANTOIN 128 RESISTANT Resistant     TRIMETH/SULFA >=320 RESISTANT Resistant     AMPICILLIN/SULBACTAM <=2 SENSITIVE Sensitive     PIP/TAZO <=4 SENSITIVE Sensitive     * 1,000 COLONIES/mL PROTEUS MIRABILIS  Blood Culture (routine x 2)     Status: Abnormal   Collection Time: 09/06/20  2:14 PM   Specimen: BLOOD  Result Value Ref Range Status   Specimen Description   Final    BLOOD LEFT ANTECUBITAL Performed at [facility], [address] [place], [place] [address]    Special Requests   Final    BOTTLES DRAWN AEROBIC AND ANAEROBIC Blood Culture adequate volume Performed at [facility], [address] [place], [place] [address]    Culture  Setup Time   Final    GRAM POSITIVE COCCI IN BOTH AEROBIC AND ANAEROBIC BOTTLES Gram Stain Report Called to,Read Back By and Verified With: LARIMORE,A@[email or web address]  BY MATTHEWS, B  3.3.2022 Performed at [place] ID to follow CRITICAL RESULT CALLED TO, READ BACK BY AND VERIFIED WITHMartin Majestic RN [id number] 09/07/20 A BROWNING Performed at [facility], [address] [place], [place] [address]    Culture STAPHYLOCOCCUS AUREUS (A)  Final   Report Status 09/09/2020 FINAL  Final   Organism ID, Bacteria STAPHYLOCOCCUS AUREUS  Final      Susceptibility   Staphylococcus aureus - MIC*    CIPROFLOXACIN <=0.5 SENSITIVE Sensitive     ERYTHROMYCIN <=0.25 SENSITIVE Sensitive     GENTAMICIN <=0.5 SENSITIVE Sensitive     OXACILLIN <=0.25 SENSITIVE Sensitive     TETRACYCLINE <=1 SENSITIVE Sensitive     VANCOMYCIN <=0.5 SENSITIVE Sensitive     TRIMETH/SULFA <=10 SENSITIVE Sensitive     CLINDAMYCIN <=0.25 SENSITIVE Sensitive     RIFAMPIN <=0.5 SENSITIVE Sensitive     Inducible Clindamycin NEGATIVE Sensitive     * STAPHYLOCOCCUS AUREUS  Blood Culture ID Panel (Reflexed)     Status: Abnormal   Collection Time: 09/06/20  2:14 PM  Result Value Ref Range Status   Enterococcus faecalis NOT DETECTED NOT DETECTED Final   Enterococcus Faecium NOT DETECTED NOT DETECTED Final   Listeria monocytogenes NOT DETECTED NOT DETECTED Final   Staphylococcus species DETECTED (A) NOT DETECTED Final    Comment: CRITICAL RESULT CALLED TO, READ BACK BY AND VERIFIED WITH: Martin Majestic RN [id number] 09/07/20 A BROWNING    Staphylococcus aureus (BCID) DETECTED (A) NOT DETECTED Final    Comment: CRITICAL RESULT CALLED TO, READ BACK BY AND VERIFIED WITH: Martin Majestic RN [phone] 09/07/20 A BROWNING    Staphylococcus epidermidis NOT DETECTED NOT DETECTED Final   Staphylococcus lugdunensis NOT DETECTED NOT DETECTED Final   Streptococcus species NOT DETECTED NOT DETECTED Final   Streptococcus agalactiae NOT DETECTED NOT DETECTED Final   Streptococcus pneumoniae NOT DETECTED NOT DETECTED Final   Streptococcus pyogenes NOT DETECTED NOT DETECTED Final   A.calcoaceticus-baumannii NOT DETECTED NOT DETECTED  Final   Bacteroides fragilis NOT DETECTED NOT DETECTED Final   Enterobacterales NOT DETECTED NOT DETECTED Final   Enterobacter cloacae complex NOT DETECTED NOT DETECTED Final   Escherichia coli NOT DETECTED NOT DETECTED Final   Klebsiella aerogenes NOT DETECTED NOT DETECTED Final   Klebsiella oxytoca NOT DETECTED NOT DETECTED Final   Klebsiella pneumoniae NOT DETECTED NOT DETECTED Final   Proteus species NOT DETECTED NOT DETECTED Final   Salmonella species NOT DETECTED NOT DETECTED Final   Serratia marcescens NOT DETECTED NOT DETECTED Final   Haemophilus influenzae NOT DETECTED NOT DETECTED Final   Neisseria meningitidis NOT DETECTED NOT DETECTED Final   Pseudomonas aeruginosa NOT DETECTED NOT DETECTED Final   Stenotrophomonas maltophilia NOT DETECTED NOT DETECTED Final   Candida albicans NOT DETECTED NOT DETECTED Final   Candida auris NOT DETECTED NOT DETECTED Final   Candida glabrata NOT DETECTED NOT DETECTED Final   Candida krusei NOT DETECTED NOT DETECTED Final   Candida parapsilosis NOT DETECTED NOT DETECTED Final   Candida tropicalis NOT DETECTED NOT DETECTED Final   Cryptococcus neoformans/gattii NOT DETECTED NOT DETECTED Final   Meth resistant mecA/C and MREJ NOT DETECTED NOT DETECTED Final    Comment: Performed at [facility], [address], [place], [place] [address]  Blood Culture (routine x 2)     Status: None   Collection Time: 09/06/20  3:59 PM   Specimen: BLOOD RIGHT HAND  Result Value Ref Range Status   Specimen Description BLOOD RIGHT HAND  Final   Special Requests   Final    BOTTLES DRAWN AEROBIC AND ANAEROBIC Blood Culture adequate volume   Culture   Final    NO GROWTH 5 DAYS Performed at [facility], [address], [place], [place] [address]    Report Status 09/11/2020 FINAL  Final  MRSA  PCR Screening     Status: None   Collection Time: 09/06/20  9:46 PM   Specimen: Nasal Mucosa; Nasopharyngeal  Result Value Ref Range Status   MRSA by PCR NEGATIVE  NEGATIVE Final    Comment:        The GeneXpert MRSA Assay (FDA approved for NASAL specimens only), is one component of a comprehensive MRSA colonization surveillance program. It is not intended to diagnose MRSA infection nor to guide or monitor treatment for MRSA infections. Performed at [facility], [address], [place], [place] [address]   Aerobic/Anaerobic Culture w Gram Stain (surgical/deep wound)     Status: None (Preliminary result)   Collection Time: 09/08/20 11:04 AM   Specimen: PATH Other; Tissue  Result Value Ref Range Status   Specimen Description   Final    FOOT RIGHT Performed at [facility], [address], [place], [place] [address]    Special Requests   Final    NONE Performed at [facility], [address] [place] [address] [place], [place] [address]    Gram Stain   Final    RARE WBC PRESENT, PREDOMINANTLY PMN MODERATE GRAM POSITIVE COCCI IN PAIRS IN CLUSTERS Performed at [facility], [place] [address] [place], [place] [address]    Culture   Final    ABUNDANT STAPHYLOCOCCUS AUREUS MODERATE ENTEROCOCCUS FAECALIS NO ANAEROBES ISOLATED; CULTURE IN PROGRESS FOR 5 DAYS    Report Status PENDING  Incomplete   Organism ID, Bacteria STAPHYLOCOCCUS AUREUS  Final   Organism ID, Bacteria ENTEROCOCCUS FAECALIS  Final      Susceptibility   Enterococcus faecalis - MIC*    AMPICILLIN <=2 SENSITIVE Sensitive     VANCOMYCIN 1 SENSITIVE Sensitive     GENTAMICIN SYNERGY SENSITIVE Sensitive     * MODERATE ENTEROCOCCUS FAECALIS   Staphylococcus aureus - MIC*    CIPROFLOXACIN <=0.5 SENSITIVE Sensitive     ERYTHROMYCIN <=0.25 SENSITIVE Sensitive     GENTAMICIN <=0.5 SENSITIVE Sensitive     OXACILLIN 0.5 SENSITIVE Sensitive     TETRACYCLINE <=1 SENSITIVE Sensitive     VANCOMYCIN <=0.5 SENSITIVE Sensitive     TRIMETH/SULFA <=10 SENSITIVE Sensitive     CLINDAMYCIN <=0.25 SENSITIVE Sensitive     RIFAMPIN <=0.5 SENSITIVE Sensitive     Inducible Clindamycin NEGATIVE Sensitive     *  ABUNDANT STAPHYLOCOCCUS AUREUS  Aerobic/Anaerobic Culture w Gram Stain (surgical/deep wound)     Status: None (Preliminary result)   Collection Time: 09/08/20 11:45 AM   Specimen: PATH Other; Tissue  Result Value Ref Range Status   Specimen Description   Final    FOOT RIGHT Performed at [facility], [address] [place], [place] [address]    Special Requests   Final    NONE Performed at [facility], [address], [place], [place] [address]    Gram Stain   Final    FEW WBC PRESENT, PREDOMINANTLY PMN MODERATE GRAM POSITIVE COCCI IN PAIRS IN CLUSTERS RARE GRAM NEGATIVE RODS Performed at [facility], [place] [address], [place], [place] [address]    Culture   Final    MODERATE STAPHYLOCOCCUS AUREUS RARE ENTEROCOCCUS FAECALIS RARE ENTEROBACTER CLOACAE NO ANAEROBES ISOLATED; CULTURE IN PROGRESS FOR 5 DAYS    Report Status PENDING  Incomplete   Organism ID, Bacteria STAPHYLOCOCCUS AUREUS  Final   Organism ID, Bacteria ENTEROCOCCUS FAECALIS  Final   Organism ID, Bacteria ENTEROBACTER CLOACAE  Final      Susceptibility   Enterobacter cloacae - MIC*  CEFAZOLIN >=64 RESISTANT Resistant     CEFEPIME <=0.12 SENSITIVE Sensitive     CEFTAZIDIME <=1 SENSITIVE Sensitive     CIPROFLOXACIN 1 SENSITIVE Sensitive     GENTAMICIN <=1 SENSITIVE Sensitive     IMIPENEM 0.5 SENSITIVE Sensitive     TRIMETH/SULFA <=20 SENSITIVE Sensitive     PIP/TAZO <=4 SENSITIVE Sensitive     * RARE ENTEROBACTER CLOACAE   Enterococcus faecalis - MIC*    AMPICILLIN <=2 SENSITIVE Sensitive     VANCOMYCIN 1 SENSITIVE Sensitive     GENTAMICIN SYNERGY SENSITIVE Sensitive     * RARE ENTEROCOCCUS FAECALIS   Staphylococcus aureus - MIC*    CIPROFLOXACIN <=0.5 SENSITIVE Sensitive     ERYTHROMYCIN <=0.25 SENSITIVE Sensitive     GENTAMICIN <=0.5 SENSITIVE Sensitive     OXACILLIN <=0.25 SENSITIVE Sensitive     TETRACYCLINE <=1 SENSITIVE Sensitive     VANCOMYCIN <=0.5 SENSITIVE Sensitive     TRIMETH/SULFA <=10  SENSITIVE Sensitive     CLINDAMYCIN <=0.25 SENSITIVE Sensitive     RIFAMPIN <=0.5 SENSITIVE Sensitive     Inducible Clindamycin NEGATIVE Sensitive     * MODERATE STAPHYLOCOCCUS AUREUS  Aerobic/Anaerobic Culture w Gram Stain (surgical/deep wound)     Status: None (Preliminary result)   Collection Time: 09/08/20 11:46 AM   Specimen: PATH Other; Tissue  Result Value Ref Range Status   Specimen Description FOOT LEFT  Final   Special Requests NONE  Final   Gram Stain   Final    RARE WBC PRESENT, PREDOMINANTLY PMN ABUNDANT GRAM POSITIVE COCCI IN CLUSTERS    Culture   Final    ABUNDANT ENTEROCOCCUS FAECALIS MODERATE STAPHYLOCOCCUS AUREUS SUSCEPTIBILITIES TO FOLLOW WITHIN MIXED ORGANISMS    Report Status PENDING  Incomplete   Organism ID, Bacteria STAPHYLOCOCCUS AUREUS  Final      Susceptibility   Staphylococcus aureus - MIC*    CIPROFLOXACIN <=0.5 SENSITIVE Sensitive     ERYTHROMYCIN <=0.25 SENSITIVE Sensitive     GENTAMICIN <=0.5 SENSITIVE Sensitive     OXACILLIN 0.5 SENSITIVE Sensitive     TETRACYCLINE <=1 SENSITIVE Sensitive     VANCOMYCIN <=0.5 SENSITIVE Sensitive     TRIMETH/SULFA <=10 SENSITIVE Sensitive     CLINDAMYCIN <=0.25 SENSITIVE Sensitive     RIFAMPIN <=0.5 SENSITIVE Sensitive     Inducible Clindamycin Value in next row Sensitive      NEGATIVEPerformed at [facility], [address], [place], [place] [address]    * MODERATE STAPHYLOCOCCUS AUREUS  Aerobic/Anaerobic Culture w Gram Stain (surgical/deep wound)     Status: None (Preliminary result)   Collection Time: 09/10/20  8:25 AM   Specimen: Foot, Left; Tissue  Result Value Ref Range Status   Specimen Description TISSUE LEFT FOOT ULCER SPEC A  Final   Special Requests LEFT FOOT ULCER  Final   Gram Stain   Final    RARE WBC PRESENT, PREDOMINANTLY PMN ABUNDANT GRAM POSITIVE COCCI RARE GRAM NEGATIVE RODS    Culture   Final    ABUNDANT STAPHYLOCOCCUS AUREUS FEW STENOTROPHOMONAS MALTOPHILIA CULTURE  REINCUBATED FOR BETTER GROWTH    Report Status PENDING  Incomplete   Organism ID, Bacteria STAPHYLOCOCCUS AUREUS  Final   Organism ID, Bacteria STENOTROPHOMONAS MALTOPHILIA  Final      Susceptibility   Staphylococcus aureus - MIC*    CIPROFLOXACIN <=0.5 SENSITIVE Sensitive     ERYTHROMYCIN <=0.25 SENSITIVE Sensitive     GENTAMICIN <=0.5 SENSITIVE Sensitive     OXACILLIN 0.5 SENSITIVE Sensitive  TETRACYCLINE <=1 SENSITIVE Sensitive     VANCOMYCIN <=0.5 SENSITIVE Sensitive     TRIMETH/SULFA <=10 SENSITIVE Sensitive     CLINDAMYCIN <=0.25 SENSITIVE Sensitive     RIFAMPIN <=0.5 SENSITIVE Sensitive     Inducible Clindamycin Value in next row Sensitive      NEGATIVEPerformed at [place] [address] [place], [place] [address]    * ABUNDANT STAPHYLOCOCCUS AUREUS   Stenotrophomonas maltophilia - MIC*    LEVOFLOXACIN Value in next row Sensitive      NEGATIVEPerformed at [facility] [address] [place], [place] [address]    TRIMETH/SULFA Value in next row Sensitive      NEGATIVEPerformed at [facility] [address] [place], [place] [address]    * FEW STENOTROPHOMONAS MALTOPHILIA  Culture, blood (routine x 2)     Status: None (Preliminary result)   Collection Time: 09/11/20  4:28 AM   Specimen: BLOOD LEFT HAND  Result Value Ref Range Status   Specimen Description BLOOD LEFT HAND  Final   Special Requests   Final    BOTTLES DRAWN AEROBIC ONLY Blood Culture results may not be optimal due to an inadequate volume of blood received in culture bottles   Culture   Final    NO GROWTH < 12 HOURS Performed at [facility], [place] [address] [place], [place] [address]    Report Status PENDING  Incomplete  Culture, blood (routine x 2)     Status: None (Preliminary result)   Collection Time: 09/11/20  4:37 AM   Specimen: BLOOD  Result Value Ref Range Status   Specimen Description BLOOD LEFT ANTECUBITAL  Final   Special Requests   Final    BOTTLES DRAWN  AEROBIC ONLY Blood Culture results may not be optimal due to an inadequate volume of blood received in culture bottles   Culture   Final    NO GROWTH < 12 HOURS Performed at [facility], [place] [address], [place],  [address]    Report Status PENDING  Incomplete     Terri Piedra, NP [facility]  09/12/2020    INFECTIOUS DISEASE ATTENDING ADDENDUM:    This patient has been seen and discussed with the Terri Piedra FNP. I have reviewed the pertinent laboratory and radiographic findings and I have independently examined the patient.  Please see the FNP's note for complete details. I concur with his findings and agree with the plan as outlined in his note with the following additions/corrections:   Patient going for BKA bilaterally tomorrow. Unless rule out deep infection including endocarditis will need 4 weeks of IV antibiotics. 1:14 PM

## 2020-09-12 NOTE — Progress Notes (Signed)
Call received from [place] office that pt's husband picked up 4 rings

## 2020-09-12 NOTE — Progress Notes (Addendum)
Inpatient Rehab Admissions Coordinator:   I met with Pt. To discuss potential CIR admit, reports bilateral transtibial amputations scheduled for this week. She is not currently a candidate for CIR, but I will follow and re-assess once therapies have seen her post-op. Note that with current payor trends,  [facility] Medicare is unlikely to approve CIR for amputations.   Clemens Catholic, [place], [place] Admissions Coordinator  [phone] ([place]) [phone] (office)

## 2020-09-12 NOTE — H&P (View-Only) (Signed)
Patient is status post debridement bilateral lower extremities.  She is up sitting in bed.  No complaints. Vital signs stable alert pleasant.  Patient understands that she does not have any options for limb salvage given the extent of her infection.  Dr. Sharol Given has discussed with her going forward with bilateral amputations tomorrow.  She is understanding and in agreement with this plan.

## 2020-09-12 NOTE — Progress Notes (Signed)
Patient is status post debridement bilateral lower extremities.  She is up sitting in bed.  No complaints. Vital signs stable alert pleasant.  Patient understands that she does not have any options for limb salvage given the extent of her infection.  Dr. Sharol Given has discussed with her going forward with bilateral amputations tomorrow.  She is understanding and in agreement with this plan.

## 2020-09-13 ENCOUNTER — Encounter (HOSPITAL_COMMUNITY): Payer: Self-pay | Admitting: Internal Medicine

## 2020-09-13 ENCOUNTER — Encounter (HOSPITAL_COMMUNITY)
Admission: EM | Disposition: A | Discharge status: Skilled Nursing Facility | Payer: Self-pay | Source: Home / Self Care | Attending: Family Medicine

## 2020-09-13 ENCOUNTER — Inpatient Hospital Stay (HOSPITAL_COMMUNITY): Payer: Medicare Other | Admitting: Certified Registered Nurse Anesthetist

## 2020-09-13 DIAGNOSIS — R7881 Bacteremia: Secondary | ICD-10-CM | POA: Diagnosis not present

## 2020-09-13 DIAGNOSIS — L97523 Non-pressure chronic ulcer of other part of left foot with necrosis of muscle: Secondary | ICD-10-CM | POA: Diagnosis not present

## 2020-09-13 DIAGNOSIS — M86071 Acute hematogenous osteomyelitis, right ankle and foot: Secondary | ICD-10-CM | POA: Diagnosis not present

## 2020-09-13 DIAGNOSIS — L97513 Non-pressure chronic ulcer of other part of right foot with necrosis of muscle: Secondary | ICD-10-CM | POA: Diagnosis not present

## 2020-09-13 DIAGNOSIS — I890 Lymphedema, not elsewhere classified: Secondary | ICD-10-CM | POA: Diagnosis not present

## 2020-09-13 DIAGNOSIS — M86072 Acute hematogenous osteomyelitis, left ankle and foot: Secondary | ICD-10-CM | POA: Diagnosis not present

## 2020-09-13 DIAGNOSIS — L03119 Cellulitis of unspecified part of limb: Secondary | ICD-10-CM | POA: Diagnosis not present

## 2020-09-13 HISTORY — PX: AMPUTATION: SHX166

## 2020-09-13 LAB — PREPARE RBC (CROSSMATCH)

## 2020-09-13 LAB — CBC WITH DIFFERENTIAL/PLATELET
Abs Immature Granulocytes: 0.49 10*3/uL — ABNORMAL HIGH (ref 0.00–0.07)
Basophils Absolute: 0 10*3/uL (ref 0.0–0.1)
Basophils Relative: 0 %
Eosinophils Absolute: 0.1 10*3/uL (ref 0.0–0.5)
Eosinophils Relative: 1 %
HCT: 22.2 % — ABNORMAL LOW (ref 36.0–46.0)
Hemoglobin: 7 g/dL — ABNORMAL LOW (ref 12.0–15.0)
Immature Granulocytes: 5 %
Lymphocytes Relative: 12 %
Lymphs Abs: 1.3 10*3/uL (ref 0.7–4.0)
MCH: 28.2 pg (ref 26.0–34.0)
MCHC: 31.5 g/dL (ref 30.0–36.0)
MCV: 89.5 fL (ref 80.0–100.0)
Monocytes Absolute: 0.8 10*3/uL (ref 0.1–1.0)
Monocytes Relative: 7 %
Neutro Abs: 8 10*3/uL — ABNORMAL HIGH (ref 1.7–7.7)
Neutrophils Relative %: 75 %
Platelets: 301 10*3/uL (ref 150–400)
RBC: 2.48 MIL/uL — ABNORMAL LOW (ref 3.87–5.11)
RDW: 17.6 % — ABNORMAL HIGH (ref 11.5–15.5)
WBC: 10.6 10*3/uL — ABNORMAL HIGH (ref 4.0–10.5)
nRBC: 0.2 % (ref 0.0–0.2)

## 2020-09-13 LAB — COMPREHENSIVE METABOLIC PANEL
ALT: 6 U/L (ref 0–44)
AST: 12 U/L — ABNORMAL LOW (ref 15–41)
Albumin: <1 g/dL — ABNORMAL LOW (ref 3.5–5.0)
Alkaline Phosphatase: 106 U/L (ref 38–126)
Anion gap: 7 (ref 5–15)
BUN: 26 mg/dL — ABNORMAL HIGH (ref 6–20)
CO2: 23 mmol/L (ref 22–32)
Calcium: 7.5 mg/dL — ABNORMAL LOW (ref 8.9–10.3)
Chloride: 104 mmol/L (ref 98–111)
Creatinine, Ser: 1.31 mg/dL — ABNORMAL HIGH (ref 0.44–1.00)
GFR, Estimated: 49 mL/min — ABNORMAL LOW (ref >60)
Glucose, Bld: 212 mg/dL — ABNORMAL HIGH (ref 70–99)
Potassium: 4.1 mmol/L (ref 3.5–5.1)
Sodium: 134 mmol/L — ABNORMAL LOW (ref 135–145)
Total Bilirubin: 0.4 mg/dL (ref 0.3–1.2)
Total Protein: 6 g/dL — ABNORMAL LOW (ref 6.5–8.1)

## 2020-09-13 LAB — GLUCOSE, CAPILLARY
Glucose-Capillary: 201 mg/dL — ABNORMAL HIGH (ref 70–99)
Glucose-Capillary: 168 mg/dL — ABNORMAL HIGH (ref 70–99)
Glucose-Capillary: 133 mg/dL — ABNORMAL HIGH (ref 70–99)
Glucose-Capillary: 131 mg/dL — ABNORMAL HIGH (ref 70–99)
Glucose-Capillary: 172 mg/dL — ABNORMAL HIGH (ref 70–99)
Glucose-Capillary: 218 mg/dL — ABNORMAL HIGH (ref 70–99)

## 2020-09-13 LAB — MAGNESIUM: Magnesium: 1.9 mg/dL (ref 1.7–2.4)

## 2020-09-13 LAB — HEMOGLOBIN AND HEMATOCRIT, BLOOD
HCT: 28.7 % — ABNORMAL LOW (ref 36.0–46.0)
Hemoglobin: 9.6 g/dL — ABNORMAL LOW (ref 12.0–15.0)

## 2020-09-13 LAB — ABO/RH: ABO/RH(D): A NEG

## 2020-09-13 SURGERY — AMPUTATION BELOW KNEE
Anesthesia: General | Site: Knee | Laterality: Bilateral

## 2020-09-13 MED ORDER — PROPOFOL 10 MG/ML IV BOLUS
INTRAVENOUS | Status: DC | PRN
  Administered 2020-09-13: 100 mg via INTRAVENOUS
  Administered 2020-09-13 (×2): 50 mg via INTRAVENOUS

## 2020-09-13 MED ORDER — FENTANYL CITRATE (PF) 250 MCG/5ML IJ SOLN
INTRAMUSCULAR | Status: AC
  Filled 2020-09-13: qty 5

## 2020-09-13 MED ORDER — CEFAZOLIN SODIUM-DEXTROSE 2-4 GM/100ML-% IV SOLN
2.0000 g | Freq: Three times a day (TID) | INTRAVENOUS | Status: DC
  Administered 2020-09-13: 2 g via INTRAVENOUS
  Administered 2020-09-13: 3 g via INTRAVENOUS
  Administered 2020-09-14 – 2020-09-17 (×11): 2 g via INTRAVENOUS
  Filled 2020-09-13 (×13): qty 100

## 2020-09-13 MED ORDER — LACTATED RINGERS IV SOLN: INTRAVENOUS | Status: DC | PRN

## 2020-09-13 MED ORDER — OXYCODONE HCL 5 MG PO TABS: 5.0000 mg | ORAL_TABLET | Freq: Once | ORAL | Status: DC | PRN

## 2020-09-13 MED ORDER — SODIUM CHLORIDE 0.9% FLUSH
10.0000 mL | INTRAVENOUS | Status: DC | PRN
  Administered 2020-09-13: 10 mL

## 2020-09-13 MED ORDER — 0.9 % SODIUM CHLORIDE (POUR BTL) OPTIME
TOPICAL | Status: DC | PRN
  Administered 2020-09-13: 1000 mL

## 2020-09-13 MED ORDER — FENTANYL CITRATE (PF) 100 MCG/2ML IJ SOLN
INTRAMUSCULAR | Status: DC | PRN
  Administered 2020-09-13 (×5): 50 ug via INTRAVENOUS

## 2020-09-13 MED ORDER — ENOXAPARIN SODIUM 80 MG/0.8ML ~~LOC~~ SOLN
70.0000 mg | SUBCUTANEOUS | Status: DC
  Administered 2020-09-14 – 2020-09-17 (×4): 70 mg via SUBCUTANEOUS
  Filled 2020-09-13 (×5): qty 0.7

## 2020-09-13 MED ORDER — ACETAMINOPHEN 10 MG/ML IV SOLN
INTRAVENOUS | Status: AC
  Filled 2020-09-13: qty 100

## 2020-09-13 MED ORDER — SODIUM CHLORIDE 0.9% IV SOLUTION: Freq: Once | INTRAVENOUS | Status: DC

## 2020-09-13 MED ORDER — ROCURONIUM BROMIDE 10 MG/ML (PF) SYRINGE
PREFILLED_SYRINGE | INTRAVENOUS | Status: DC | PRN
  Administered 2020-09-13: 60 mg via INTRAVENOUS
  Administered 2020-09-13 (×2): 20 mg via INTRAVENOUS

## 2020-09-13 MED ORDER — OXYCODONE HCL 5 MG/5ML PO SOLN: 5.0000 mg | Freq: Once | ORAL | Status: DC | PRN

## 2020-09-13 MED ORDER — MIDAZOLAM HCL 2 MG/2ML IJ SOLN
INTRAMUSCULAR | Status: DC | PRN
  Administered 2020-09-13: 2 mg via INTRAVENOUS

## 2020-09-13 MED ORDER — FENTANYL CITRATE (PF) 100 MCG/2ML IJ SOLN
INTRAMUSCULAR | Status: AC
  Filled 2020-09-13: qty 2

## 2020-09-13 MED ORDER — FENTANYL CITRATE (PF) 100 MCG/2ML IJ SOLN
25.0000 ug | INTRAMUSCULAR | Status: DC | PRN
  Administered 2020-09-13: 25 ug via INTRAVENOUS
  Administered 2020-09-13: 50 ug via INTRAVENOUS

## 2020-09-13 MED ORDER — PROPOFOL 10 MG/ML IV BOLUS
INTRAVENOUS | Status: AC
  Filled 2020-09-13: qty 20

## 2020-09-13 MED ORDER — PROPOFOL 10 MG/ML IV BOLUS
INTRAVENOUS | Status: AC
  Filled 2020-09-13: qty 40

## 2020-09-13 MED ORDER — SODIUM CHLORIDE 0.9 % IV SOLN: INTRAVENOUS | Status: DC

## 2020-09-13 MED ORDER — ACETAMINOPHEN 160 MG/5ML PO SOLN: 1000.0000 mg | Freq: Once | ORAL | Status: DC | PRN

## 2020-09-13 MED ORDER — ACETAMINOPHEN 500 MG PO TABS: 1000.0000 mg | ORAL_TABLET | Freq: Once | ORAL | Status: DC | PRN

## 2020-09-13 MED ORDER — MIDAZOLAM HCL 2 MG/2ML IJ SOLN
INTRAMUSCULAR | Status: AC
  Filled 2020-09-13: qty 2

## 2020-09-13 MED ORDER — ONDANSETRON HCL 4 MG/2ML IJ SOLN
INTRAMUSCULAR | Status: DC | PRN
  Administered 2020-09-13: 4 mg via INTRAVENOUS

## 2020-09-13 MED ORDER — SUGAMMADEX SODIUM 200 MG/2ML IV SOLN
INTRAVENOUS | Status: DC | PRN
  Administered 2020-09-13: 300 mg via INTRAVENOUS

## 2020-09-13 MED ORDER — SODIUM CHLORIDE 0.9% FLUSH
10.0000 mL | Freq: Two times a day (BID) | INTRAVENOUS | Status: DC
  Administered 2020-09-13 – 2020-09-17 (×7): 10 mL
  Administered 2020-09-17: 20 mL

## 2020-09-13 MED ORDER — ACETAMINOPHEN 10 MG/ML IV SOLN
1000.0000 mg | Freq: Once | INTRAVENOUS | Status: DC | PRN
  Administered 2020-09-13: 1000 mg via INTRAVENOUS

## 2020-09-13 MED ORDER — DEXAMETHASONE SODIUM PHOSPHATE 10 MG/ML IJ SOLN
INTRAMUSCULAR | Status: DC | PRN
  Administered 2020-09-13: 5 mg via INTRAVENOUS

## 2020-09-13 SURGICAL SUPPLY — 40 items
BLADE SAW RECIP 87.9 MT (BLADE) ×2 IMPLANT
BLADE SURG 21 STRL SS (BLADE) ×2 IMPLANT
BNDG COHESIVE 6X5 TAN NS LF (GAUZE/BANDAGES/DRESSINGS) ×2 IMPLANT
BNDG COHESIVE 6X5 TAN STRL LF (GAUZE/BANDAGES/DRESSINGS) IMPLANT
CANISTER WOUND CARE 500ML ATS (WOUND CARE) ×3 IMPLANT
COVER SURGICAL LIGHT HANDLE (MISCELLANEOUS) ×2 IMPLANT
COVER WAND RF STERILE (DRAPES) IMPLANT
CUFF TOURN SGL QUICK 34 (TOURNIQUET CUFF)
CUFF TRNQT CYL 34X4.125X (TOURNIQUET CUFF) ×1 IMPLANT
DRAPE INCISE IOBAN 66X45 STRL (DRAPES) ×2 IMPLANT
DRAPE U-SHAPE 47X51 STRL (DRAPES) ×2 IMPLANT
DRESSING PREVENA PLUS CUSTOM (GAUZE/BANDAGES/DRESSINGS) ×1 IMPLANT
DRSG PREVENA PLUS CUSTOM (GAUZE/BANDAGES/DRESSINGS) ×4
DURAPREP 26ML APPLICATOR (WOUND CARE) ×3 IMPLANT
ELECT PENCIL ROCKER SW 15FT (MISCELLANEOUS) ×1 IMPLANT
ELECT REM PT RETURN 9FT ADLT (ELECTROSURGICAL) ×4
ELECTRODE REM PT RTRN 9FT ADLT (ELECTROSURGICAL) ×1 IMPLANT
GLOVE BIOGEL PI IND STRL 9 (GLOVE) ×1 IMPLANT
GLOVE BIOGEL PI INDICATOR 9 (GLOVE) ×1
GLOVE SURG ORTHO 9.0 STRL STRW (GLOVE) ×2 IMPLANT
GOWN STRL REUS W/ TWL XL LVL3 (GOWN DISPOSABLE) ×2 IMPLANT
GOWN STRL REUS W/TWL XL LVL3 (GOWN DISPOSABLE) ×4
KIT BASIN OR (CUSTOM PROCEDURE TRAY) ×2 IMPLANT
KIT TURNOVER KIT B (KITS) ×2 IMPLANT
MANIFOLD NEPTUNE II (INSTRUMENTS) ×2 IMPLANT
NS IRRIG 1000ML POUR BTL (IV SOLUTION) ×2 IMPLANT
PACK ORTHO EXTREMITY (CUSTOM PROCEDURE TRAY) ×2 IMPLANT
PAD ARMBOARD 7.5X6 YLW CONV (MISCELLANEOUS) ×2 IMPLANT
PREVENA RESTOR ARTHOFORM 33X30 (CANNISTER) ×2 IMPLANT
PREVENA RESTOR ARTHOFORM 46X30 (CANNISTER) ×3 IMPLANT
SPONGE LAP 18X18 RF (DISPOSABLE) ×1 IMPLANT
STAPLER VISISTAT 35W (STAPLE) ×4 IMPLANT
STOCKINETTE IMPERVIOUS LG (DRAPES) ×2 IMPLANT
SUT ETHILON 2 0 PSLX (SUTURE) ×8 IMPLANT
SUT SILK 2 0 (SUTURE) ×2
SUT SILK 2-0 18XBRD TIE 12 (SUTURE) ×1 IMPLANT
SUT VIC AB 1 CTX 27 (SUTURE) ×4 IMPLANT
TOWEL GREEN STERILE (TOWEL DISPOSABLE) ×2 IMPLANT
TUBE CONNECTING 12X1/4 (SUCTIONS) ×2 IMPLANT
YANKAUER SUCT BULB TIP NO VENT (SUCTIONS) ×2 IMPLANT

## 2020-09-13 NOTE — Anesthesia Postprocedure Evaluation (Signed)
Anesthesia Post Note  Patient: ADRIENNA KARIS  Procedure(s) Performed: BILATERAL BELOW KNEE AMPUTATIONS (Bilateral Knee)     Patient location during evaluation: PACU Anesthesia Type: General Level of consciousness: awake and alert Pain management: pain level controlled Vital Signs Assessment: post-procedure vital signs reviewed and stable Respiratory status: spontaneous breathing, nonlabored ventilation, respiratory function stable and patient connected to nasal cannula oxygen Cardiovascular status: blood pressure returned to baseline and stable Postop Assessment: no apparent nausea or vomiting Anesthetic complications: no   No complications documented.  Last Vitals:  Vitals:   09/13/20 1805 09/13/20 2028  BP: (!) 168/70 (!) 157/64  Pulse: 64 61  Resp: 16   Temp: (!) 36.3 C (!) 36.3 C  SpO2: 100% 100%    Last Pain:  Vitals:   09/13/20 2028  TempSrc: Oral  PainSc:                  March Rummage Matheu Ploeger

## 2020-09-13 NOTE — Interval H&P Note (Signed)
History and Physical Interval Note:  09/13/2020 6:39 AM  Michelle Skinner  has presented today for surgery, with the diagnosis of osteomyelitis and abscess bilateral feet.  The various methods of treatment have been discussed with the patient and family. After consideration of risks, benefits and other options for treatment, the patient has consented to  Procedure(s): BILATERAL BELOW KNEE AMPUTATIONS (Bilateral) as a surgical intervention.  The patient's history has been reviewed, patient examined, no change in status, stable for surgery.  I have reviewed the patient's chart and labs.  Questions were answered to the patient's satisfaction.     [name]

## 2020-09-13 NOTE — Anesthesia Procedure Notes (Signed)
Central Venous Catheter Insertion Performed by: Oleta Mouse, MD, anesthesiologist Start/End3/03/2021 12:14 PM, 09/13/2020 12:22 PM Patient location: Pre-op. Preanesthetic checklist: patient identified, IV checked, site marked, risks and benefits discussed, surgical consent, monitors and equipment checked, pre-op evaluation, timeout performed and anesthesia consent Lidocaine 1% used for infiltration and patient sedated Hand hygiene performed  and maximum sterile barriers used  Catheter size: 8 Fr Total catheter length 16. Central line was placed.Double lumen Procedure performed using ultrasound guided technique. Ultrasound Notes:image(s) printed for medical record Attempts: 1 Following insertion, dressing applied, line sutured and Biopatch. Post procedure assessment: blood return through all ports, free fluid flow and no air  Patient tolerated the procedure well with no immediate complications.

## 2020-09-13 NOTE — Anesthesia Preprocedure Evaluation (Addendum)
Anesthesia Evaluation  Patient identified by MRN, date of birth, ID band  Reviewed: Allergy & Precautions, NPO status , Patient's Chart, lab work & pertinent test results, reviewed documented beta blocker date and time   History of Anesthesia Complications Negative for: history of anesthetic complications  Airway Mallampati: II  TM Distance: >3 FB Neck ROM: Full    Dental  (+) Poor Dentition, Missing, Dental Advisory Given   Pulmonary neg pulmonary ROS,  09/06/2020 SARS coronavirus NEG   breath sounds clear to auscultation       Cardiovascular hypertension, Pt. on medications and Pt. on home beta blockers (-) angina Rhythm:Regular Rate:Normal  1. Left ventricular ejection fraction, by estimation, is 55 to 60%. The  left ventricle has normal function. The left ventricle has no regional  wall motion abnormalities. There is mild concentric left ventricular  hypertrophy. Left ventricular diastolic  parameters were normal.  2. Right ventricular systolic function is normal. The right ventricular  size is normal. Tricuspid regurgitation signal is inadequate for assessing  PA pressure.  3. The mitral valve is normal in structure. Trivial mitral valve  regurgitation. No evidence of mitral stenosis.  4. The aortic valve is grossly normal. There is mild calcification of the  aortic valve. Aortic valve regurgitation is not visualized. No aortic  stenosis is present.  5. The inferior vena cava is dilated in size with >50% respiratory  variability, suggesting right atrial pressure of 8 mmHg.    Neuro/Psych PSYCHIATRIC DISORDERS (PTSD) Anxiety Depression negative neurological ROS     GI/Hepatic Neg liver ROS, GERD  Medicated and Controlled,  Endo/Other  diabetes, Insulin DependentHypothyroidism Morbid obesity  Renal/GU Renal InsufficiencyRenal disease   H/o cervical cancer    Musculoskeletal   Abdominal (+) + obese,   Peds   Hematology  (+) Blood dyscrasia (Hb 8.8, INR 1.4), anemia ,   Anesthesia Other Findings   Reproductive/Obstetrics                             Anesthesia Physical Anesthesia Plan  ASA: IV  Anesthesia Plan: General   Post-op Pain Management:    Induction: Intravenous  PONV Risk Score and Plan: 3 and Ondansetron and Treatment may vary due to age or medical condition  Airway Management Planned: Oral ETT  Additional Equipment: None  Intra-op Plan:   Post-operative Plan: Extubation in OR  Informed Consent: I have reviewed the patients History and Physical, chart, labs and discussed the procedure including the risks, benefits and alternatives for the proposed anesthesia with the patient or authorized representative who has indicated his/her understanding and acceptance.     Dental advisory given  Plan Discussed with: CRNA and Surgeon  Anesthesia Plan Comments:         Anesthesia Quick Evaluation

## 2020-09-13 NOTE — Progress Notes (Signed)
Patient arrived to unit at 1800, report received from PACU RN. Patient VSS. Patient in bed with call light in reach.

## 2020-09-13 NOTE — Anesthesia Procedure Notes (Signed)
Procedure Name: Intubation Date/Time: 09/13/2020 3:18 PM Performed by: Reece Agar, CRNA Pre-anesthesia Checklist: Patient identified, Emergency Drugs available, Suction available and Patient being monitored Patient Re-evaluated:Patient Re-evaluated prior to induction Oxygen Delivery Method: Circle System Utilized Preoxygenation: Pre-oxygenation with 100% oxygen Induction Type: IV induction Ventilation: Mask ventilation without difficulty Laryngoscope Size: Mac and 3 Grade View: Grade I Tube type: Oral Tube size: 7.0 mm Number of attempts: 1 Airway Equipment and Method: Patient positioned with wedge pillow and Stylet Placement Confirmation: ETT inserted through vocal cords under direct vision,  positive ETCO2 and breath sounds checked- equal and bilateral Secured at: 22 cm Tube secured with: Tape Dental Injury: Teeth and Oropharynx as per pre-operative assessment

## 2020-09-13 NOTE — Progress Notes (Signed)
Report given to OR nurse [id number]. Patient transported to OR, alert and oriented x4. Patient NPO after midnight. VSS

## 2020-09-13 NOTE — Op Note (Signed)
   Date of Surgery: 09/13/2020  INDICATIONS: Michelle Skinner is a 55 y.o.-year-old female who had circumferential abscesses of both feet.  Patient underwent serial debridements of both feet for attempted foot salvage intervention.  Despite aggressive foot salvage intervention patient had abscess circumferential around her feet osteomyelitis with insufficient soft tissue envelope and presents at this time for bilateral transtibial amputations.Marland Kitchen  PREOPERATIVE DIAGNOSIS: Abscess and osteomyelitis bilateral feet.  POSTOPERATIVE DIAGNOSIS: Same.  PROCEDURE: Transtibial amputation bilateral. Application of Prevena wound VAC bilateral lower extremities  SURGEON: Sharol Given, M.D.  ANESTHESIA:  general  IV FLUIDS AND URINE: See anesthesia records.  Patient received 1 unit of packed red blood cells preoperatively and 2 units of packed red blood cells intraoperatively.  Estimated blood loss 1200 cc.  ESTIMATED BLOOD LOSS: See anesthesia records.  COMPLICATIONS: None.  DESCRIPTION OF PROCEDURE: The patient was brought to the operating room after undergoing regional anesthetic. After adequate levels of anesthesia were obtained patient's bilateral lower extremity were prepped using DuraPrep draped into a sterile field. A timeout was called.  Both feet were draped out of the sterile field with impervious stockinette.  Attention was first focused on the left lower extremity  A transverse incision was made 11 cm distal to the tibial tubercle. This curved proximally and a large posterior flap was created. The tibia was transected 1 cm proximal to the skin incision. The fibula was transected just proximal to the tibial incision. The tibia was beveled anteriorly. A large posterior flap was created. The sciatic nerve was pulled cut and allowed to retract. The vascular bundles were suture ligated with 2-0 silk. The deep and superficial fascial layers were closed using #1 Vicryl. The skin was closed using staples and 2-0  nylon. The wound was covered with a Prevena customizable and arthroform wound VAC.   Attention was then focused on the right lower extremity.   A transverse incision was made 11 cm distal to the tibial tubercle. This curved proximally and a large posterior flap was created. The tibia was transected 1 cm proximal to the skin incision. The fibula was transected just proximal to the tibial incision. The tibia was beveled anteriorly. A large posterior flap was created. The sciatic nerve was pulled cut and allowed to retract. The vascular bundles were suture ligated with 2-0 silk. The deep and superficial fascial layers were closed using #1 Vicryl. The skin was closed using staples and 2-0 nylon. The wound was covered with a Prevena customizable and arthroform wound VAC.   The dressings were sealed with dermatac there was a good suction fit.. Patient was taken to the PACU in stable condition.   DISCHARGE PLANNING:  Antibiotic duration: 24 hours  Weightbearing: Nonweightbearing on both lower extremity  Pain medication: Opioid pathway  Dressing care/ Wound VAC: Continue wound VAC for 1 week after discharge  Discharge to: Anticipate discharge to skilled nursing.  Follow-up: In the office 1 week post operative.  Meridee Score, MD [place] 4:49 PM

## 2020-09-13 NOTE — Transfer of Care (Signed)
Immediate Anesthesia Transfer of Care Note  Patient: Michelle Skinner  Procedure(s) Performed: BILATERAL BELOW KNEE AMPUTATIONS (Bilateral Knee)  Patient Location: PACU  Anesthesia Type:General  Level of Consciousness: awake and alert   Airway & Oxygen Therapy: Patient Spontanous Breathing and Patient connected to face mask oxygen  Post-op Assessment: Report given to RN and Post -op Vital signs reviewed and stable  Post vital signs: Reviewed and stable  Last Vitals:  Vitals Value Taken Time  BP 190/76 09/13/20 1654  Temp    Pulse 64 09/13/20 1653  Resp 20 09/13/20 1653  SpO2 97 % 09/13/20 1653  Vitals shown include unvalidated device data.  Last Pain:  Vitals:   09/13/20 1400  TempSrc: Oral  PainSc:       Patients Stated Pain Goal: 3 (15/40/08 6761)  Complications: No complications documented.

## 2020-09-13 NOTE — Progress Notes (Addendum)
PROGRESS NOTE    Michelle Skinner   DGL:[id number]  DOB: March 26, 1966  PCP: Monico Blitz, MD    DOA: 09/06/2020 LOS: 7   Brief Narrative   55 y.o.femalewith medical history significant ofmorbid obesity, DM2 on insulin w/ peripheral neuropathy, HTN, GERD. She reports that she incurred "bone bruises" to both feetseveral weeks agowhich have progressed to open wounds. She had progressive weakness andmarked pain in both feet. She presented to [facility] for evaluation.  Pt had initial debridement by Dr. Constance Haw at [facility] and subsequently transferred to [facility] for consult with Dr. Sharol Given.  Being treated with IV antibiotics for MSSA bacteremia. 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  Assessment & Plan   Principal Problem:   Plantar ulcer of right foot ([place]) Active Problems:   Uncontrolled type 2 diabetes mellitus with complication, with long-term current use of insulin (HCC)   Morbid (severe) obesity due to excess calories ([place])   Primary hypothyroidism   Mixed hyperlipidemia   Essential hypertension, benign   Depression   GERD (gastroesophageal reflux disease)   Cellulitis   Sepsis ([place])   Plantar ulcer of left foot (HCC)   Lymphedema   Severe protein-calorie malnutrition (HCC)   BMI 50.0-59.9, adult ([place])   MSSA bacteremia   Acute hematogenous osteomyelitis of both feet (HCC)   Sepsis secondary to MSSA bacteremia, bilateral lower extremity osteomyelitis, cellulitis -due to nonsalvageable chronic lymphedema wounds.   --Consults: Infectious disease, general surgery, orthopedic surgery --Had bilateral BKA's today with Dr. Due to --Pain control as needed, bowel regimen --Bilateral wound vacs in place --Start working with PT tomorrow --Continue cefazolin  --Per ID: Anticipate 4 weeks IV antibiotics unless deep infection including endocarditis is ruled out, per ID --TTE on 3/6 -negative for vegetation --Consider TEE for definitive rule out of endocarditis  AKI superimposed on CKD stage IIIa  -AKI improved and near baseline.  Continue to monitor renal function.  Avoid nephrotoxins and hypotension  Acute on chronic anemia -hemoglobin 7.0 this morning.  Appears was transfused 1 unit RBCs with surgery this afternoon. --Check postop H&H --Transfuse as needed if hemoglobin less than 7.0  Leukocytosis -due to infection, improving.  Monitor CBC  Primary hypothyroidism -continue Synthroid  Hyperlipidemia -continue statin  Essential hypertension -with intermittently soft BPs, stable.  Hold hydralazine.  Continue adjusted dose Coreg.  Monitor BP closely.  Depression -continue home Prozac, trazodone  GERD -continue PPI   Morbid obesity: Body mass index is 50.35 kg/m.  Complicates overall care and prognosis.  DVT prophylaxis: SCDs Start: 09/10/20 0932   Diet:  Diet Orders (From admission, onward)    Start     Ordered   09/13/20 0001  Diet NPO time specified  Diet effective midnight       Comments: NPO except meds with sip of water.   09/12/20 1813            Code Status: Full Code    Subjective 09/13/20    Patient seen in PACU after surgery today.  She is awake but still drowsy.  She nods her head that yes having pain.  No other acute complaints.  Disposition Plan & Communication   Status is: Inpatient  Remains inpatient appropriate because:Inpatient level of care appropriate due to severity of illness   Dispo: The patient is from: Home              Anticipated d/c is to: CIR vs SNF              Patient currently  is not medically stable to d/c.   Difficult to place patient No   Family Communication: none present on rounds, will attempt to call    Consults, Procedures, Significant Events   Consultants:   Orthopedic surgery  Infectious disease  General surgery  Procedures:   3/9 -bilateral below-knee amputations  Antimicrobials:  Anti-infectives (From admission, onward)   Start     Dose/Rate Route Frequency Ordered Stop   09/13/20 2200   ceFAZolin (ANCEF) IVPB 2g/100 mL premix        2 g 200 mL/hr over 30 Minutes Intravenous Every 8 hours 09/13/20 1338     09/13/20 0600  ceFAZolin (ANCEF) 3 g in dextrose 5 % 50 mL IVPB        3 g 100 mL/hr over 30 Minutes Intravenous On call to O.R. 09/12/20 1252 09/14/20 0559   09/10/20 1600  Ampicillin-Sulbactam (UNASYN) 3 g in sodium chloride 0.9 % 100 mL IVPB  Status:  Discontinued        3 g 200 mL/hr over 30 Minutes Intravenous Every 6 hours 09/10/20 1437 09/13/20 1338   09/10/20 0730  ceFAZolin (ANCEF) 3 g in dextrose 5 % 50 mL IVPB  Status:  Discontinued        3 g 100 mL/hr over 30 Minutes Intravenous On call to O.R. 09/10/20 7824 09/10/20 0928   09/10/20 0646  ceFAZolin (ANCEF) 2-4 GM/100ML-% IVPB  Status:  Discontinued       Note to Pharmacy: Ladoris Gene   : cabinet override      09/10/20 0646 09/10/20 0709   09/10/20 0646  ceFAZolin (ANCEF) 1-4 GM/50ML-% IVPB       Note to Pharmacy: Ladoris Gene   : cabinet override      09/10/20 0646 09/10/20 0812   09/08/20 1400  ceFAZolin (ANCEF) IVPB 2g/100 mL premix  Status:  Discontinued        2 g 200 mL/hr over 30 Minutes Intravenous Every 8 hours 09/08/20 0947 09/10/20 1432   09/07/20 1700  vancomycin (VANCOREADY) IVPB 1750 mg/350 mL  Status:  Discontinued        1,750 mg 175 mL/hr over 120 Minutes Intravenous Every 24 hours 09/06/20 1606 09/08/20 0947   09/07/20 1600  vancomycin (VANCOREADY) IVPB 1750 mg/350 mL  Status:  Discontinued        1,750 mg 175 mL/hr over 120 Minutes Intravenous Every 24 hours 09/06/20 1605 09/06/20 1606   09/06/20 1615  vancomycin (VANCOREADY) IVPB 2000 mg/400 mL        2,000 mg 200 mL/hr over 120 Minutes Intravenous  Once 09/06/20 1605 09/06/20 1856   09/06/20 1415  cefTRIAXone (ROCEPHIN) 2 g in sodium chloride 0.9 % 100 mL IVPB  Status:  Discontinued        2 g 200 mL/hr over 30 Minutes Intravenous Every 24 hours 09/06/20 1404 09/08/20 0947        Micro    Objective   Vitals:   09/13/20  1345 09/13/20 1350 09/13/20 1400 09/13/20 1650  BP: (!) 168/64 (!) 168/64 (!) 160/66 (!) 190/76  Pulse:  63 62 64  Resp: 18 18 19 18   Temp: 98.1 F (36.7 C) 98.1 F (36.7 C) 97.9 F (36.6 C) 98 F (36.7 C)  TempSrc: Oral Oral Oral   SpO2: 100%  100% 100%  Weight:      Height:        Intake/Output Summary (Last 24 hours) at 09/13/2020 1711 Last data filed at 09/13/2020 1645 Gross per  24 hour  Intake 2795 ml  Output 3700 ml  Net -905 ml   Filed Weights   09/06/20 1249 09/06/20 2200 09/13/20 1141  Weight: (!) 158.8 kg (!) 148.1 kg (!) 148.1 kg    Physical Exam:  General exam: awake, alert, no acute distress, morbidly obese HEENT: clear conjunctiva, anicteric sclera, hearing grossly normal  Respiratory system: CTAB anteriorly, no wheezes, rales or rhonchi, normal respiratory effort. Cardiovascular system: normal S1/S2, RRR, Gastrointestinal system: soft, NT, ND,  Central nervous system: no gross focal neurologic deficits, normal speech Extremities: Wound vacs on bilateral BKA stumps, no edema, normal tone Psychiatry: normal mood, flat affect  Labs   Data Reviewed: I have personally reviewed following labs and imaging studies  CBC: Recent Labs  Lab 09/07/20 0622 09/11/20 0428 09/12/20 0239 09/13/20 0514  WBC 21.2* 15.9* 12.5* 10.6*  NEUTROABS  --  12.9* 9.7* 8.0*  HGB 8.8* 7.5* 7.6* 7.0*  HCT 27.3* 23.7* 23.7* 22.2*  MCV 86.9 87.5 88.4 89.5  PLT 353 289 296 518   Basic Metabolic Panel: Recent Labs  Lab 09/07/20 0622 09/07/20 1126 09/11/20 0428 09/12/20 0239 09/13/20 0514  NA 131* 132* 132* 136 134*  K 3.4* 3.3* 3.5 3.6 4.1  CL 99 98 103 106 104  CO2 21* 22 18* 22 23  GLUCOSE 221* 192* 129* 186* 212*  BUN 52* 51* 36* 30* 26*  CREATININE 1.81* 1.79* 1.47* 1.41* 1.31*  CALCIUM 7.7* 7.8* 7.5* 7.5* 7.5*  MG  --   --  2.0 2.0 1.9   GFR: Estimated Creatinine Clearance: 76 mL/min (A) (by C-G formula based on SCr of 1.31 mg/dL (H)). Liver Function  Tests: Recent Labs  Lab 09/11/20 0428 09/12/20 0239 09/13/20 0514  AST 12* 12* 12*  ALT 5 6 6   ALKPHOS 99 95 106  BILITOT 0.7 0.6 0.4  PROT 5.8* 5.9* 6.0*  ALBUMIN <1.0* <1.0* <1.0*   No results for input(s): LIPASE, AMYLASE in the last 168 hours. No results for input(s): AMMONIA in the last 168 hours. Coagulation Profile: Recent Labs  Lab 09/07/20 0622  INR 1.4*   Cardiac Enzymes: No results for input(s): CKTOTAL, CKMB, CKMBINDEX, TROPONINI in the last 168 hours. BNP (last 3 results) No results for input(s): PROBNP in the last 8760 hours. HbA1C: No results for input(s): HGBA1C in the last 72 hours. CBG: Recent Labs  Lab 09/12/20 2013 09/13/20 0647 09/13/20 1025 09/13/20 1411 09/13/20 1649  GLUCAP 145* 201* 168* 133* 131*   Lipid Profile: No results for input(s): CHOL, HDL, LDLCALC, TRIG, CHOLHDL, LDLDIRECT in the last 72 hours. Thyroid Function Tests: No results for input(s): TSH, T4TOTAL, FREET4, T3FREE, THYROIDAB in the last 72 hours. Anemia Panel: No results for input(s): VITAMINB12, FOLATE, FERRITIN, TIBC, IRON, RETICCTPCT in the last 72 hours. Sepsis Labs: No results for input(s): PROCALCITON, LATICACIDVEN in the last 168 hours.  Recent Results (from the past 240 hour(s))  Resp Panel by RT-PCR (Flu A&B, Covid) Nasopharyngeal Swab     Status: None   Collection Time: 09/06/20  2:01 PM   Specimen: Nasopharyngeal Swab; Nasopharyngeal(NP) swabs in vial transport medium  Result Value Ref Range Status   SARS Coronavirus 2 by RT PCR NEGATIVE NEGATIVE Final    Comment: (NOTE) SARS-CoV-2 target nucleic acids are NOT DETECTED.  The SARS-CoV-2 RNA is generally detectable in upper respiratory specimens during the acute phase of infection. The lowest concentration of SARS-CoV-2 viral copies this assay can detect is 138 copies/mL. A negative result does not preclude SARS-Cov-2  infection and should not be used as the sole basis for treatment or other patient  management decisions. A negative result may occur with  improper specimen collection/handling, submission of specimen other than nasopharyngeal swab, presence of viral mutation(s) within the areas targeted by this assay, and inadequate number of viral copies(<138 copies/mL). A negative result must be combined with clinical observations, patient history, and epidemiological information. The expected result is Negative.  Fact Sheet for Patients:  EntrepreneurPulse.com.au  Fact Sheet for Healthcare Providers:  IncredibleEmployment.be  This test is no t yet approved or cleared by the [place] FDA and  has been authorized for detection and/or diagnosis of SARS-CoV-2 by FDA under an Emergency Use Authorization (EUA). This EUA will remain  in effect (meaning this test can be used) for the duration of the COVID-19 declaration under Section 564(b)(1) of the Act, 21 U.S.C.section 360bbb-3(b)(1), unless the authorization is terminated  or revoked sooner.       Influenza A by PCR NEGATIVE NEGATIVE Final   Influenza B by PCR NEGATIVE NEGATIVE Final    Comment: (NOTE) The Xpert Xpress SARS-CoV-2/FLU/RSV plus assay is intended as an aid in the diagnosis of influenza from Nasopharyngeal swab specimens and should not be used as a sole basis for treatment. Nasal washings and aspirates are unacceptable for Xpert Xpress SARS-CoV-2/FLU/RSV testing.  Fact Sheet for Patients: EntrepreneurPulse.com.au  Fact Sheet for Healthcare Providers: IncredibleEmployment.be  This test is not yet approved or cleared by the [place] FDA and has been authorized for detection and/or diagnosis of SARS-CoV-2 by FDA under an Emergency Use Authorization (EUA). This EUA will remain in effect (meaning this test can be used) for the duration of the COVID-19 declaration under Section 564(b)(1) of the Act, 21 U.S.C. section 360bbb-3(b)(1),  unless the authorization is terminated or revoked.  Performed at [facility], [address] [place], [place] [address]   Urine culture     Status: Abnormal   Collection Time: 09/06/20  2:01 PM   Specimen: In/Out Cath Urine  Result Value Ref Range Status   Specimen Description   Final    IN/OUT CATH URINE Performed at [facility], [address], [place], [place] [address]    Special Requests   Final    NONE Performed at [facility], [address], [place], [place] [address]    Culture (A)  Final    1,000 COLONIES/mL PROTEUS MIRABILIS 3,000 COLONIES/mL KLEBSIELLA PNEUMONIAE    Report Status 09/09/2020 FINAL  Final   Organism ID, Bacteria PROTEUS MIRABILIS (A)  Final   Organism ID, Bacteria KLEBSIELLA PNEUMONIAE (A)  Final      Susceptibility   Klebsiella pneumoniae - MIC*    AMPICILLIN RESISTANT Resistant     CEFAZOLIN <=4 SENSITIVE Sensitive     CEFEPIME <=0.12 SENSITIVE Sensitive     CEFTRIAXONE <=0.25 SENSITIVE Sensitive     CIPROFLOXACIN <=0.25 SENSITIVE Sensitive     GENTAMICIN <=1 SENSITIVE Sensitive     IMIPENEM <=0.25 SENSITIVE Sensitive     NITROFURANTOIN 32 SENSITIVE Sensitive     TRIMETH/SULFA <=20 SENSITIVE Sensitive     AMPICILLIN/SULBACTAM <=2 SENSITIVE Sensitive     PIP/TAZO <=4 SENSITIVE Sensitive     * 3,000 COLONIES/mL KLEBSIELLA PNEUMONIAE   Proteus mirabilis - MIC*    AMPICILLIN <=2 SENSITIVE Sensitive     CEFAZOLIN 8 SENSITIVE Sensitive     CEFEPIME <=0.12 SENSITIVE Sensitive     CEFTRIAXONE <=0.25 SENSITIVE Sensitive     CIPROFLOXACIN >=4 RESISTANT Resistant  GENTAMICIN <=1 SENSITIVE Sensitive     IMIPENEM 2 SENSITIVE Sensitive     NITROFURANTOIN 128 RESISTANT Resistant     TRIMETH/SULFA >=320 RESISTANT Resistant     AMPICILLIN/SULBACTAM <=2 SENSITIVE Sensitive     PIP/TAZO <=4 SENSITIVE Sensitive     * 1,000 COLONIES/mL PROTEUS MIRABILIS  Blood Culture (routine x 2)     Status: Abnormal   Collection Time: 09/06/20  2:14 PM   Specimen:  BLOOD  Result Value Ref Range Status   Specimen Description   Final    BLOOD LEFT ANTECUBITAL Performed at [facility], [address], [place], [place] [address]    Special Requests   Final    BOTTLES DRAWN AEROBIC AND ANAEROBIC Blood Culture adequate volume Performed at [facility], [address], [place], [place] [address]    Culture  Setup Time   Final    GRAM POSITIVE COCCI IN BOTH AEROBIC AND ANAEROBIC BOTTLES Gram Stain Report Called to,Read Back By and Verified With: LARIMORE,A@0725  BY MATTHEWS, B 3.3.2022 Performed at [facility] ID to follow CRITICAL RESULT CALLED TO, READ BACK BY AND VERIFIED WITHMartin Majestic RN [id number] 09/07/20 A BROWNING Performed at [facility], [place] [address] [place],  [address]    Culture STAPHYLOCOCCUS AUREUS (A)  Final   Report Status 09/09/2020 FINAL  Final   Organism ID, Bacteria STAPHYLOCOCCUS AUREUS  Final      Susceptibility   Staphylococcus aureus - MIC*    CIPROFLOXACIN <=0.5 SENSITIVE Sensitive     ERYTHROMYCIN <=0.25 SENSITIVE Sensitive     GENTAMICIN <=0.5 SENSITIVE Sensitive     OXACILLIN <=0.25 SENSITIVE Sensitive     TETRACYCLINE <=1 SENSITIVE Sensitive     VANCOMYCIN <=0.5 SENSITIVE Sensitive     TRIMETH/SULFA <=10 SENSITIVE Sensitive     CLINDAMYCIN <=0.25 SENSITIVE Sensitive     RIFAMPIN <=0.5 SENSITIVE Sensitive     Inducible Clindamycin NEGATIVE Sensitive     * STAPHYLOCOCCUS AUREUS  Blood Culture ID Panel (Reflexed)     Status: Abnormal   Collection Time: 09/06/20  2:14 PM  Result Value Ref Range Status   Enterococcus faecalis NOT DETECTED NOT DETECTED Final   Enterococcus Faecium NOT DETECTED NOT DETECTED Final   Listeria monocytogenes NOT DETECTED NOT DETECTED Final   Staphylococcus species DETECTED (A) NOT DETECTED Final    Comment: CRITICAL RESULT CALLED TO, READ BACK BY AND VERIFIED WITH: Martin Majestic RN 1025 09/07/20 A BROWNING    Staphylococcus aureus (BCID) DETECTED (A) NOT DETECTED Final     Comment: CRITICAL RESULT CALLED TO, READ BACK BY AND VERIFIED WITH: Martin Majestic RN [id number] 09/07/20 A BROWNING    Staphylococcus epidermidis NOT DETECTED NOT DETECTED Final   Staphylococcus lugdunensis NOT DETECTED NOT DETECTED Final   Streptococcus species NOT DETECTED NOT DETECTED Final   Streptococcus agalactiae NOT DETECTED NOT DETECTED Final   Streptococcus pneumoniae NOT DETECTED NOT DETECTED Final   Streptococcus pyogenes NOT DETECTED NOT DETECTED Final   A.calcoaceticus-baumannii NOT DETECTED NOT DETECTED Final   Bacteroides fragilis NOT DETECTED NOT DETECTED Final   Enterobacterales NOT DETECTED NOT DETECTED Final   Enterobacter cloacae complex NOT DETECTED NOT DETECTED Final   Escherichia coli NOT DETECTED NOT DETECTED Final   Klebsiella aerogenes NOT DETECTED NOT DETECTED Final   Klebsiella oxytoca NOT DETECTED NOT DETECTED Final   Klebsiella pneumoniae NOT DETECTED NOT DETECTED Final   Proteus species NOT DETECTED NOT DETECTED Final   Salmonella species NOT DETECTED NOT DETECTED Final  Serratia marcescens NOT DETECTED NOT DETECTED Final   Haemophilus influenzae NOT DETECTED NOT DETECTED Final   Neisseria meningitidis NOT DETECTED NOT DETECTED Final   Pseudomonas aeruginosa NOT DETECTED NOT DETECTED Final   Stenotrophomonas maltophilia NOT DETECTED NOT DETECTED Final   Candida albicans NOT DETECTED NOT DETECTED Final   Candida auris NOT DETECTED NOT DETECTED Final   Candida glabrata NOT DETECTED NOT DETECTED Final   Candida krusei NOT DETECTED NOT DETECTED Final   Candida parapsilosis NOT DETECTED NOT DETECTED Final   Candida tropicalis NOT DETECTED NOT DETECTED Final   Cryptococcus neoformans/gattii NOT DETECTED NOT DETECTED Final   Meth resistant mecA/C and MREJ NOT DETECTED NOT DETECTED Final    Comment: Performed at [facility], [place] [address] [place], [place] [address]  Blood Culture (routine x 2)     Status: None   Collection Time: 09/06/20  3:59 PM    Specimen: BLOOD RIGHT HAND  Result Value Ref Range Status   Specimen Description BLOOD RIGHT HAND  Final   Special Requests   Final    BOTTLES DRAWN AEROBIC AND ANAEROBIC Blood Culture adequate volume   Culture   Final    NO GROWTH 5 DAYS Performed at [facility], [address], [place], [place] [address]    Report Status 09/11/2020 FINAL  Final  MRSA PCR Screening     Status: None   Collection Time: 09/06/20  9:46 PM   Specimen: Nasal Mucosa; Nasopharyngeal  Result Value Ref Range Status   MRSA by PCR NEGATIVE NEGATIVE Final    Comment:        The GeneXpert MRSA Assay (FDA approved for NASAL specimens only), is one component of a comprehensive MRSA colonization surveillance program. It is not intended to diagnose MRSA infection nor to guide or monitor treatment for MRSA infections. Performed at [facility], [address], [place], [place] [address]   Aerobic/Anaerobic Culture w Gram Stain (surgical/deep wound)     Status: None (Preliminary result)   Collection Time: 09/08/20 11:04 AM   Specimen: PATH Other; Tissue  Result Value Ref Range Status   Specimen Description   Final    FOOT RIGHT Performed at [facility], [address] [place], [place] [address]    Special Requests   Final    NONE Performed at [facility], [address], [place], [place] [address]    Gram Stain   Final    RARE WBC PRESENT, PREDOMINANTLY PMN MODERATE GRAM POSITIVE COCCI IN PAIRS IN CLUSTERS Performed at [facility], [place] [address], [place],  [address]    Culture   Final    ABUNDANT STAPHYLOCOCCUS AUREUS MODERATE ENTEROCOCCUS FAECALIS NO ANAEROBES ISOLATED; CULTURE IN PROGRESS FOR 5 DAYS    Report Status PENDING  Incomplete   Organism ID, Bacteria STAPHYLOCOCCUS AUREUS  Final   Organism ID, Bacteria ENTEROCOCCUS FAECALIS  Final      Susceptibility   Enterococcus faecalis - MIC*    AMPICILLIN <=2 SENSITIVE Sensitive     VANCOMYCIN 1 SENSITIVE Sensitive     GENTAMICIN  SYNERGY SENSITIVE Sensitive     * MODERATE ENTEROCOCCUS FAECALIS   Staphylococcus aureus - MIC*    CIPROFLOXACIN <=0.5 SENSITIVE Sensitive     ERYTHROMYCIN <=0.25 SENSITIVE Sensitive     GENTAMICIN <=0.5 SENSITIVE Sensitive     OXACILLIN 0.5 SENSITIVE Sensitive     TETRACYCLINE <=1 SENSITIVE Sensitive     VANCOMYCIN <=0.5 SENSITIVE Sensitive     TRIMETH/SULFA <=10 SENSITIVE  Sensitive     CLINDAMYCIN <=0.25 SENSITIVE Sensitive     RIFAMPIN <=0.5 SENSITIVE Sensitive     Inducible Clindamycin NEGATIVE Sensitive     * ABUNDANT STAPHYLOCOCCUS AUREUS  Aerobic/Anaerobic Culture w Gram Stain (surgical/deep wound)     Status: None (Preliminary result)   Collection Time: 09/08/20 11:45 AM   Specimen: PATH Other; Tissue  Result Value Ref Range Status   Specimen Description   Final    FOOT RIGHT Performed at [facility], [address] [place], [place] [address]    Special Requests   Final    NONE Performed at [facility], [address] [place], [place] [address]    Gram Stain   Final    FEW WBC PRESENT, PREDOMINANTLY PMN MODERATE GRAM POSITIVE COCCI IN PAIRS IN CLUSTERS RARE GRAM NEGATIVE RODS Performed at [facility], [place] [address] [place], [place] [address]    Culture   Final    MODERATE STAPHYLOCOCCUS AUREUS RARE ENTEROCOCCUS FAECALIS RARE ENTEROBACTER CLOACAE NO ANAEROBES ISOLATED; CULTURE IN PROGRESS FOR 5 DAYS    Report Status PENDING  Incomplete   Organism ID, Bacteria STAPHYLOCOCCUS AUREUS  Final   Organism ID, Bacteria ENTEROCOCCUS FAECALIS  Final   Organism ID, Bacteria ENTEROBACTER CLOACAE  Final      Susceptibility   Enterobacter cloacae - MIC*    CEFAZOLIN >=64 RESISTANT Resistant     CEFEPIME <=0.12 SENSITIVE Sensitive     CEFTAZIDIME <=1 SENSITIVE Sensitive     CIPROFLOXACIN 1 SENSITIVE Sensitive     GENTAMICIN <=1 SENSITIVE Sensitive     IMIPENEM 0.5 SENSITIVE Sensitive     TRIMETH/SULFA <=20 SENSITIVE Sensitive     PIP/TAZO <=4 SENSITIVE Sensitive      * RARE ENTEROBACTER CLOACAE   Enterococcus faecalis - MIC*    AMPICILLIN <=2 SENSITIVE Sensitive     VANCOMYCIN 1 SENSITIVE Sensitive     GENTAMICIN SYNERGY SENSITIVE Sensitive     * RARE ENTEROCOCCUS FAECALIS   Staphylococcus aureus - MIC*    CIPROFLOXACIN <=0.5 SENSITIVE Sensitive     ERYTHROMYCIN <=0.25 SENSITIVE Sensitive     GENTAMICIN <=0.5 SENSITIVE Sensitive     OXACILLIN <=0.25 SENSITIVE Sensitive     TETRACYCLINE <=1 SENSITIVE Sensitive     VANCOMYCIN <=0.5 SENSITIVE Sensitive     TRIMETH/SULFA <=10 SENSITIVE Sensitive     CLINDAMYCIN <=0.25 SENSITIVE Sensitive     RIFAMPIN <=0.5 SENSITIVE Sensitive     Inducible Clindamycin NEGATIVE Sensitive     * MODERATE STAPHYLOCOCCUS AUREUS  Aerobic/Anaerobic Culture w Gram Stain (surgical/deep wound)     Status: None (Preliminary result)   Collection Time: 09/08/20 11:46 AM   Specimen: PATH Other; Tissue  Result Value Ref Range Status   Specimen Description   Final    FOOT LEFT Performed at [facility], [address], [place], [place] [address]    Special Requests   Final    NONE Performed at [facility], [address], [place], [place] [address]    Gram Stain   Final    RARE WBC PRESENT, PREDOMINANTLY PMN ABUNDANT GRAM POSITIVE COCCI IN CLUSTERS Performed at [facility], [address], [place], [place] [address]    Culture   Final    ABUNDANT ENTEROCOCCUS FAECALIS MODERATE STAPHYLOCOCCUS AUREUS WITHIN MIXED ORGANISMS NO ANAEROBES ISOLATED; CULTURE IN PROGRESS FOR 5 DAYS    Report Status PENDING  Incomplete   Organism ID, Bacteria STAPHYLOCOCCUS AUREUS  Final   Organism ID, Bacteria ENTEROCOCCUS FAECALIS  Final      Susceptibility   Enterococcus faecalis - MIC*    AMPICILLIN <=2 SENSITIVE Sensitive     VANCOMYCIN 1 SENSITIVE Sensitive     GENTAMICIN SYNERGY SENSITIVE Sensitive     * ABUNDANT ENTEROCOCCUS FAECALIS   Staphylococcus aureus - MIC*    CIPROFLOXACIN <=0.5 SENSITIVE Sensitive      ERYTHROMYCIN <=0.25 SENSITIVE Sensitive     GENTAMICIN <=0.5 SENSITIVE Sensitive     OXACILLIN 0.5 SENSITIVE Sensitive     TETRACYCLINE <=1 SENSITIVE Sensitive     VANCOMYCIN <=0.5 SENSITIVE Sensitive     TRIMETH/SULFA <=10 SENSITIVE Sensitive     CLINDAMYCIN <=0.25 SENSITIVE Sensitive     RIFAMPIN <=0.5 SENSITIVE Sensitive     Inducible Clindamycin NEGATIVE Sensitive     * MODERATE STAPHYLOCOCCUS AUREUS  Aerobic/Anaerobic Culture w Gram Stain (surgical/deep wound)     Status: None (Preliminary result)   Collection Time: 09/10/20  8:25 AM   Specimen: Foot, Left; Tissue  Result Value Ref Range Status   Specimen Description TISSUE LEFT FOOT ULCER SPEC A  Final   Special Requests LEFT FOOT ULCER  Final   Gram Stain   Final    RARE WBC PRESENT, PREDOMINANTLY PMN ABUNDANT GRAM POSITIVE COCCI RARE GRAM NEGATIVE RODS Performed at [facility], [address] [place], [place] [address]    Culture   Final    ABUNDANT STAPHYLOCOCCUS AUREUS FEW STENOTROPHOMONAS MALTOPHILIA RARE ENTEROBACTER CLOACAE NO ANAEROBES ISOLATED; CULTURE IN PROGRESS FOR 5 DAYS    Report Status PENDING  Incomplete   Organism ID, Bacteria STAPHYLOCOCCUS AUREUS  Final   Organism ID, Bacteria STENOTROPHOMONAS MALTOPHILIA  Final   Organism ID, Bacteria ENTEROBACTER CLOACAE  Final      Susceptibility   Enterobacter cloacae - MIC*    CEFAZOLIN >=64 RESISTANT Resistant     CEFEPIME <=0.12 SENSITIVE Sensitive     CEFTAZIDIME <=1 SENSITIVE Sensitive     CIPROFLOXACIN 1 SENSITIVE Sensitive     GENTAMICIN <=1 SENSITIVE Sensitive     IMIPENEM 0.5 SENSITIVE Sensitive     TRIMETH/SULFA <=20 SENSITIVE Sensitive     PIP/TAZO <=4 SENSITIVE Sensitive     * RARE ENTEROBACTER CLOACAE   Staphylococcus aureus - MIC*    CIPROFLOXACIN <=0.5 SENSITIVE Sensitive     ERYTHROMYCIN <=0.25 SENSITIVE Sensitive     GENTAMICIN <=0.5 SENSITIVE Sensitive     OXACILLIN 0.5 SENSITIVE Sensitive     TETRACYCLINE <=1 SENSITIVE Sensitive      VANCOMYCIN <=0.5 SENSITIVE Sensitive     TRIMETH/SULFA <=10 SENSITIVE Sensitive     CLINDAMYCIN <=0.25 SENSITIVE Sensitive     RIFAMPIN <=0.5 SENSITIVE Sensitive     Inducible Clindamycin NEGATIVE Sensitive     * ABUNDANT STAPHYLOCOCCUS AUREUS   Stenotrophomonas maltophilia - MIC*    LEVOFLOXACIN 0.5 SENSITIVE Sensitive     TRIMETH/SULFA <=20 SENSITIVE Sensitive     * FEW STENOTROPHOMONAS MALTOPHILIA  Culture, blood (routine x 2)     Status: None (Preliminary result)   Collection Time: 09/11/20  4:28 AM   Specimen: BLOOD LEFT HAND  Result Value Ref Range Status   Specimen Description BLOOD LEFT HAND  Final   Special Requests   Final    BOTTLES DRAWN AEROBIC ONLY Blood Culture results may not be optimal due to an inadequate volume of blood received in culture bottles   Culture   Final    NO GROWTH 2 DAYS Performed at [facility], [address] [place], [place]  [address]    Report Status PENDING  Incomplete  Culture, blood (routine x 2)     Status: None (Preliminary result)   Collection Time: 09/11/20  4:37 AM   Specimen: BLOOD  Result Value Ref Range Status   Specimen Description BLOOD LEFT ANTECUBITAL  Final   Special Requests   Final    BOTTLES DRAWN AEROBIC ONLY Blood Culture results may not be optimal due to an inadequate volume of blood received in culture bottles   Culture   Final    NO GROWTH 2 DAYS Performed at [facility], [place] [address] [place], [place] [address]    Report Status PENDING  Incomplete  Surgical pcr screen     Status: Abnormal   Collection Time: 09/12/20 12:44 PM   Specimen: Nasal Mucosa; Nasal Swab  Result Value Ref Range Status   MRSA, PCR NEGATIVE NEGATIVE Final   Staphylococcus aureus POSITIVE (A) NEGATIVE Final    Comment: (NOTE) The Xpert SA Assay (FDA approved for NASAL specimens in patients 16 years of age and older), is one component of a comprehensive surveillance program. It is not intended to diagnose infection nor  to guide or monitor treatment. Performed at [facility], [place] [address] [place], [place] [address]       Imaging Studies   No results found.   Medications   Scheduled Meds: . sodium chloride   Intravenous Once  . sodium chloride   Intravenous Once  . [MAR Hold] atorvastatin  40 mg Oral QHS  . [MAR Hold] Chlorhexidine Gluconate Cloth  6 each Topical Daily  . [MAR Hold] Chlorhexidine Gluconate Cloth  6 each Topical Q0600  . [MAR Hold] cholecalciferol  5,000 Units Oral Daily  . [MAR Hold] docusate sodium  100 mg Oral BID  . [MAR Hold] enoxaparin (LOVENOX) injection  70 mg Subcutaneous Q24H  . fentaNYL      . [MAR Hold] FLUoxetine  40 mg Oral Daily  . [MAR Hold] insulin aspart  0-20 Units Subcutaneous TID WC  . [MAR Hold] insulin aspart  15 Units Subcutaneous TID WC  . [MAR Hold] insulin glargine  28 Units Subcutaneous BID  . [MAR Hold] levothyroxine  150 mcg Oral QAC breakfast  . [MAR Hold] magnesium oxide  200 mg Oral Daily  . [MAR Hold] metoprolol succinate  100 mg Oral QHS  . [MAR Hold] mupirocin ointment  1 application Nasal BID  . [MAR Hold] pantoprazole  40 mg Oral Daily  . [MAR Hold] senna  1 tablet Oral BID   Continuous Infusions: . sodium chloride Stopped (09/13/20 1637)  . sodium chloride    . acetaminophen    . acetaminophen 1,000 mg (09/13/20 1706)  .  ceFAZolin (ANCEF) IV    .  ceFAZolin (ANCEF) IV    . [MAR Hold] methocarbamol (ROBAXIN) IV         LOS: 7 days    Time spent: 30 minutes    Ezekiel Slocumb, DO [facility]  09/13/2020, 5:11 PM      If 7PM-7AM, please contact night-coverage. How to contact the [facility] Attending or Consulting provider [place] or covering provider during after hours [place], for this patient?    1. Check the care team in [facility] and look for a) attending/consulting [facility] provider listed and b) the [facility] team listed 2. Log into [email or web address] and use [facility] [place]'s universal password to access. If you do not have  the password, please contact the hospital operator.  3. Locate the [facility] provider you are looking for under [facility] and page to a number that you can be directly reached. 4. If you still have difficulty reaching the provider, please page the [facility] (Director on Call) for the Hospitalists listed on amion for assistance.

## 2020-09-14 ENCOUNTER — Encounter (HOSPITAL_COMMUNITY): Payer: Self-pay | Admitting: Orthopedic Surgery

## 2020-09-14 DIAGNOSIS — L97513 Non-pressure chronic ulcer of other part of right foot with necrosis of muscle: Secondary | ICD-10-CM | POA: Diagnosis not present

## 2020-09-14 DIAGNOSIS — I890 Lymphedema, not elsewhere classified: Secondary | ICD-10-CM | POA: Diagnosis not present

## 2020-09-14 DIAGNOSIS — L03119 Cellulitis of unspecified part of limb: Secondary | ICD-10-CM | POA: Diagnosis not present

## 2020-09-14 DIAGNOSIS — E118 Type 2 diabetes mellitus with unspecified complications: Secondary | ICD-10-CM

## 2020-09-14 DIAGNOSIS — M86071 Acute hematogenous osteomyelitis, right ankle and foot: Secondary | ICD-10-CM | POA: Diagnosis not present

## 2020-09-14 DIAGNOSIS — R7881 Bacteremia: Secondary | ICD-10-CM | POA: Diagnosis not present

## 2020-09-14 LAB — COMPREHENSIVE METABOLIC PANEL
ALT: 6 U/L (ref 0–44)
AST: 19 U/L (ref 15–41)
Albumin: <1 g/dL — ABNORMAL LOW (ref 3.5–5.0)
Alkaline Phosphatase: 76 U/L (ref 38–126)
Anion gap: 5 (ref 5–15)
BUN: 31 mg/dL — ABNORMAL HIGH (ref 6–20)
CO2: 26 mmol/L (ref 22–32)
Calcium: 7.4 mg/dL — ABNORMAL LOW (ref 8.9–10.3)
Chloride: 103 mmol/L (ref 98–111)
Creatinine, Ser: 1.4 mg/dL — ABNORMAL HIGH (ref 0.44–1.00)
GFR, Estimated: 45 mL/min — ABNORMAL LOW (ref >60)
Glucose, Bld: 437 mg/dL — ABNORMAL HIGH (ref 70–99)
Potassium: 5.7 mmol/L — ABNORMAL HIGH (ref 3.5–5.1)
Sodium: 134 mmol/L — ABNORMAL LOW (ref 135–145)
Total Bilirubin: 0.1 mg/dL — ABNORMAL LOW (ref 0.3–1.2)
Total Protein: 5.6 g/dL — ABNORMAL LOW (ref 6.5–8.1)

## 2020-09-14 LAB — AEROBIC/ANAEROBIC CULTURE W GRAM STAIN (SURGICAL/DEEP WOUND)

## 2020-09-14 LAB — CBC WITH DIFFERENTIAL/PLATELET
Abs Immature Granulocytes: 0.28 10*3/uL — ABNORMAL HIGH (ref 0.00–0.07)
Basophils Absolute: 0 10*3/uL (ref 0.0–0.1)
Basophils Relative: 0 %
Eosinophils Absolute: 0 10*3/uL (ref 0.0–0.5)
Eosinophils Relative: 0 %
HCT: 25 % — ABNORMAL LOW (ref 36.0–46.0)
Hemoglobin: 7.7 g/dL — ABNORMAL LOW (ref 12.0–15.0)
Immature Granulocytes: 2 %
Lymphocytes Relative: 6 %
Lymphs Abs: 0.8 10*3/uL (ref 0.7–4.0)
MCH: 28.2 pg (ref 26.0–34.0)
MCHC: 30.8 g/dL (ref 30.0–36.0)
MCV: 91.6 fL (ref 80.0–100.0)
Monocytes Absolute: 0.6 10*3/uL (ref 0.1–1.0)
Monocytes Relative: 5 %
Neutro Abs: 11.1 10*3/uL — ABNORMAL HIGH (ref 1.7–7.7)
Neutrophils Relative %: 87 %
Platelets: 334 10*3/uL (ref 150–400)
RBC: 2.73 MIL/uL — ABNORMAL LOW (ref 3.87–5.11)
RDW: 16 % — ABNORMAL HIGH (ref 11.5–15.5)
WBC: 12.8 10*3/uL — ABNORMAL HIGH (ref 4.0–10.5)
nRBC: 0 % (ref 0.0–0.2)

## 2020-09-14 LAB — GLUCOSE, CAPILLARY
Glucose-Capillary: 404 mg/dL — ABNORMAL HIGH (ref 70–99)
Glucose-Capillary: 384 mg/dL — ABNORMAL HIGH (ref 70–99)
Glucose-Capillary: 395 mg/dL — ABNORMAL HIGH (ref 70–99)
Glucose-Capillary: 400 mg/dL — ABNORMAL HIGH (ref 70–99)
Glucose-Capillary: 291 mg/dL — ABNORMAL HIGH (ref 70–99)
Glucose-Capillary: 150 mg/dL — ABNORMAL HIGH (ref 70–99)
Glucose-Capillary: 80 mg/dL (ref 70–99)

## 2020-09-14 LAB — MAGNESIUM: Magnesium: 1.9 mg/dL (ref 1.7–2.4)

## 2020-09-14 MED ORDER — INSULIN ASPART 100 UNIT/ML ~~LOC~~ SOLN: 25.0000 [IU] | Freq: Once | SUBCUTANEOUS | Status: DC

## 2020-09-14 MED ORDER — SODIUM ZIRCONIUM CYCLOSILICATE 10 G PO PACK
10.0000 g | PACK | Freq: Once | ORAL | Status: AC
  Administered 2020-09-14: 10 g via ORAL
  Filled 2020-09-14: qty 1

## 2020-09-14 MED ORDER — OXYCODONE HCL 5 MG PO TABS: 5.0000 mg | ORAL_TABLET | ORAL | Status: DC | PRN

## 2020-09-14 NOTE — Progress Notes (Signed)
PHARMACY CONSULT NOTE FOR:  OUTPATIENT  PARENTERAL ANTIBIOTIC THERAPY (OPAT)  Indication: MSSA Bacteremia  Regimen: Cefazolin 2g IV q8h End date: 10/09/2020  IV antibiotic discharge orders are pended. To discharging provider:  please sign these orders via discharge navigator,  Select New Orders & click on the button choice - Manage This Unsigned Work.     Thank you for allowing pharmacy to be a part of this patient's care.  Phillis Haggis 09/14/2020, 9:34 AM

## 2020-09-14 NOTE — Progress Notes (Signed)
MD notified of contradicting foley orders at 1059 and asked for clarification of discontinuation of foley. Keep foley in per Nicole Kindred, DO.

## 2020-09-14 NOTE — Progress Notes (Addendum)
PROGRESS NOTE    Michelle Skinner   WUJ:[id number]  DOB: 1966/05/05  PCP: Monico Blitz, MD    DOA: 09/06/2020 LOS: 8   Brief Narrative   55 y.o.femalewith medical history significant ofmorbid obesity, DM2 on insulin w/ peripheral neuropathy, HTN, GERD. She reports that she incurred "bone bruises" to both feetseveral weeks agowhich have progressed to open wounds. She had progressive weakness andmarked pain in both feet. She presented to [facility] for evaluation.  Pt had initial debridement by Dr. Constance Haw at [facility] and subsequently transferred to [facility] for consult with Dr. Sharol Given.  Being treated with IV antibiotics for MSSA bacteremia. 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 Sharol Given.  Assessment & Plan   Principal Problem:   Plantar ulcer of right foot ([place]) Active Problems:   Uncontrolled type 2 diabetes mellitus with complication, with long-term current use of insulin (HCC)   Morbid (severe) obesity due to excess calories ([place])   Primary hypothyroidism   Mixed hyperlipidemia   Essential hypertension, benign   Depression   GERD (gastroesophageal reflux disease)   Cellulitis   Sepsis ([place])   Plantar ulcer of left foot (HCC)   Lymphedema   Severe protein-calorie malnutrition (HCC)   BMI 50.0-59.9, adult ([place])   MSSA bacteremia   Acute hematogenous osteomyelitis of both feet (HCC)   Sepsis secondary to MSSA bacteremia, bilateral lower extremity osteomyelitis, cellulitis -due to nonsalvageable chronic lymphedema wounds.   --Consults: Infectious disease (signed off), general surgery, orthopedic surgery --Continue cefazolin  --Per ID: Anticipate at least 4 weeks IV antibiotics from date of negative cultures --TTE on 3/6 -negative for vegetation --TEE not needed per ID --Pain control as needed, bowel regimen --Bilateral wound vacs in place --PT/OT  AKI superimposed on CKD stage IIIa -AKI improved and near baseline.  Continue to monitor renal function.  Avoid nephrotoxins and  hypotension.  Acute on chronic anemia -hemoglobin 7.7 this morning.  Transfused 3 unit RBCs with surgery on 3/9. --Monitor H&H --Transfuse as needed if hemoglobin less than 7.0 or active bleeding  Acute urinary retention - pt has Foley since before surgery, unclear if it was POA by chart review, pt says it was but unable to find documentation of it.   --Maintain Foley for now, consider removal and voiding trial, urology consult if retaining.  Leukocytosis -due to infection, improving.  Monitor CBC  Primary hypothyroidism -continue Synthroid  Hyperlipidemia -continue statin  Essential hypertension -with intermittently soft BPs, stable.  Hold hydralazine.  Continue adjusted dose Coreg.  Monitor BP closely.  Depression -continue home Prozac, trazodone  GERD -continue PPI   Morbid obesity: Body mass index is 50.35 kg/m.  Complicates overall care and prognosis.  DVT prophylaxis: SCDs Start: 09/10/20 0932   Diet:  Diet Orders (From admission, onward)    Start     Ordered   09/13/20 1819  Diet Carb Modified Fluid consistency: Thin; Room service appropriate? Yes  Diet effective now       Question Answer Comment  Calorie Level Medium 1600-2000   Fluid consistency: Thin   Room service appropriate? Yes      09/13/20 1818            Code Status: Full Code    Subjective 09/14/20    Patient sitting up in bed when seen today.  Reports 8 out of 10 pain in both legs.  Requesting more water to drink, feels thirsty.  No fever/chills, n/v/d, cough, sob, cp or other complaints.   Disposition Plan & Communication  Status is: Inpatient  Remains inpatient appropriate because:Inpatient level of care appropriate due to severity of illness   Dispo: The patient is from: Home              Anticipated d/c is to: CIR vs SNF              Patient currently is not medically stable to d/c.   Difficult to place patient No   Family Communication: none present on rounds, will attempt to call     Consults, Procedures, Significant Events   Consultants:   Orthopedic surgery  Infectious disease  General surgery  Procedures:   3/9 -bilateral below-knee amputations  Antimicrobials:  Anti-infectives (From admission, onward)   Start     Dose/Rate Route Frequency Ordered Stop   09/13/20 2200  ceFAZolin (ANCEF) IVPB 2g/100 mL premix        2 g 200 mL/hr over 30 Minutes Intravenous Every 8 hours 09/13/20 1338     09/13/20 0600  ceFAZolin (ANCEF) 3 g in dextrose 5 % 50 mL IVPB  Status:  Discontinued        3 g 100 mL/hr over 30 Minutes Intravenous On call to O.R. 09/12/20 1252 09/13/20 1818   09/10/20 1600  Ampicillin-Sulbactam (UNASYN) 3 g in sodium chloride 0.9 % 100 mL IVPB  Status:  Discontinued        3 g 200 mL/hr over 30 Minutes Intravenous Every 6 hours 09/10/20 1437 09/13/20 1338   09/10/20 0730  ceFAZolin (ANCEF) 3 g in dextrose 5 % 50 mL IVPB  Status:  Discontinued        3 g 100 mL/hr over 30 Minutes Intravenous On call to O.R. 09/10/20 0160 09/10/20 0928   09/10/20 0646  ceFAZolin (ANCEF) 2-4 GM/100ML-% IVPB  Status:  Discontinued       Note to Pharmacy: Michelle Skinner   : cabinet override      09/10/20 0646 09/10/20 0709   09/10/20 0646  ceFAZolin (ANCEF) 1-4 GM/50ML-% IVPB       Note to Pharmacy: Michelle Skinner   : cabinet override      09/10/20 0646 09/10/20 0812   09/08/20 1400  ceFAZolin (ANCEF) IVPB 2g/100 mL premix  Status:  Discontinued        2 g 200 mL/hr over 30 Minutes Intravenous Every 8 hours 09/08/20 0947 09/10/20 1432   09/07/20 1700  vancomycin (VANCOREADY) IVPB 1750 mg/350 mL  Status:  Discontinued        1,750 mg 175 mL/hr over 120 Minutes Intravenous Every 24 hours 09/06/20 1606 09/08/20 0947   09/07/20 1600  vancomycin (VANCOREADY) IVPB 1750 mg/350 mL  Status:  Discontinued        1,750 mg 175 mL/hr over 120 Minutes Intravenous Every 24 hours 09/06/20 1605 09/06/20 1606   09/06/20 1615  vancomycin (VANCOREADY) IVPB 2000 mg/400 mL         2,000 mg 200 mL/hr over 120 Minutes Intravenous  Once 09/06/20 1605 09/06/20 1856   09/06/20 1415  cefTRIAXone (ROCEPHIN) 2 g in sodium chloride 0.9 % 100 mL IVPB  Status:  Discontinued        2 g 200 mL/hr over 30 Minutes Intravenous Every 24 hours 09/06/20 1404 09/08/20 0947        Micro    Objective   Vitals:   09/13/20 2352 09/14/20 0015 09/14/20 0320 09/14/20 0805  BP: 140/74 (!) 120/59 (!) 146/58 129/85  Pulse: 60 (!) 55 (!) 55 65  Resp: 16  17 18 17   Temp: (!) 97.4 F (36.3 C) (!) 97.3 F (36.3 C) 97.7 F (36.5 C) 98.4 F (36.9 C)  TempSrc: Oral Oral Oral Oral  SpO2: 100% 100% 100% 100%  Weight:      Height:        Intake/Output Summary (Last 24 hours) at 09/14/2020 1439 Last data filed at 09/13/2020 2100 Gross per 24 hour  Intake 1780 ml  Output 2100 ml  Net -320 ml   Filed Weights   09/06/20 1249 09/06/20 2200 09/13/20 1141  Weight: (!) 158.8 kg (!) 148.1 kg (!) 148.1 kg    Physical Exam:  General exam: awake, alert, no acute distress, morbidly obese Respiratory system: CTAB, on room air, no wheezes, rales or rhonchi, normal respiratory effort. Cardiovascular system: normal S1/S2, RRR Gastrointestinal system: soft, NT, ND  Central nervous system: no gross focal neurologic deficits, normal speech Extremities: Wound vacs on bilateral BKA stumps, no edema, normal tone Psychiatry: normal mood, flat affect  Labs   Data Reviewed: I have personally reviewed following labs and imaging studies  CBC: Recent Labs  Lab 09/11/20 0428 09/12/20 0239 09/13/20 0514 09/13/20 1854 09/14/20 0347  WBC 15.9* 12.5* 10.6*  --  12.8*  NEUTROABS 12.9* 9.7* 8.0*  --  11.1*  HGB 7.5* 7.6* 7.0* 9.6* 7.7*  HCT 23.7* 23.7* 22.2* 28.7* 25.0*  MCV 87.5 88.4 89.5  --  91.6  PLT 289 296 301  --  025   Basic Metabolic Panel: Recent Labs  Lab 09/11/20 0428 09/12/20 0239 09/13/20 0514 09/14/20 0347  NA 132* 136 134* 134*  K 3.5 3.6 4.1 5.7*  CL 103 106 104 103  CO2 18*  22 23 26   GLUCOSE 129* 186* 212* 437*  BUN 36* 30* 26* 31*  CREATININE 1.47* 1.41* 1.31* 1.40*  CALCIUM 7.5* 7.5* 7.5* 7.4*  MG 2.0 2.0 1.9 1.9   GFR: Estimated Creatinine Clearance: 71.1 mL/min (A) (by C-G formula based on SCr of 1.4 mg/dL (H)). Liver Function Tests: Recent Labs  Lab 09/11/20 0428 09/12/20 0239 09/13/20 0514 09/14/20 0347  AST 12* 12* 12* 19  ALT 5 6 6 6   ALKPHOS 99 95 106 76  BILITOT 0.7 0.6 0.4 0.1*  PROT 5.8* 5.9* 6.0* 5.6*  ALBUMIN <1.0* <1.0* <1.0* <1.0*   No results for input(s): LIPASE, AMYLASE in the last 168 hours. No results for input(s): AMMONIA in the last 168 hours. Coagulation Profile: No results for input(s): INR, PROTIME in the last 168 hours. Cardiac Enzymes: No results for input(s): CKTOTAL, CKMB, CKMBINDEX, TROPONINI in the last 168 hours. BNP (last 3 results) No results for input(s): PROBNP in the last 8760 hours. HbA1C: No results for input(s): HGBA1C in the last 72 hours. CBG: Recent Labs  Lab 09/14/20 0634 09/14/20 0807 09/14/20 0945 09/14/20 1055 09/14/20 1256  GLUCAP 404* 384* 395* 400* 291*   Lipid Profile: No results for input(s): CHOL, HDL, LDLCALC, TRIG, CHOLHDL, LDLDIRECT in the last 72 hours. Thyroid Function Tests: No results for input(s): TSH, T4TOTAL, FREET4, T3FREE, THYROIDAB in the last 72 hours. Anemia Panel: No results for input(s): VITAMINB12, FOLATE, FERRITIN, TIBC, IRON, RETICCTPCT in the last 72 hours. Sepsis Labs: No results for input(s): PROCALCITON, LATICACIDVEN in the last 168 hours.  Recent Results (from the past 240 hour(s))  Resp Panel by RT-PCR (Flu A&B, Covid) Nasopharyngeal Swab     Status: None   Collection Time: 09/06/20  2:01 PM   Specimen: Nasopharyngeal Swab; Nasopharyngeal(NP) swabs in vial transport medium  Result Value Ref Range Status   SARS Coronavirus 2 by RT PCR NEGATIVE NEGATIVE Final    Comment: (NOTE) SARS-CoV-2 target nucleic acids are NOT DETECTED.  The SARS-CoV-2 RNA is  generally detectable in upper respiratory specimens during the acute phase of infection. The lowest concentration of SARS-CoV-2 viral copies this assay can detect is 138 copies/mL. A negative result does not preclude SARS-Cov-2 infection and should not be used as the sole basis for treatment or other patient management decisions. A negative result may occur with  improper specimen collection/handling, submission of specimen other than nasopharyngeal swab, presence of viral mutation(s) within the areas targeted by this assay, and inadequate number of viral copies(<138 copies/mL). A negative result must be combined with clinical observations, patient history, and epidemiological information. The expected result is Negative.  Fact Sheet for Patients:  EntrepreneurPulse.com.au  Fact Sheet for Healthcare Providers:  IncredibleEmployment.be  This test is no t yet approved or cleared by the [place] FDA and  has been authorized for detection and/or diagnosis of SARS-CoV-2 by FDA under an Emergency Use Authorization (EUA). This EUA will remain  in effect (meaning this test can be used) for the duration of the COVID-19 declaration under Section 564(b)(1) of the Act, 21 U.S.C.section 360bbb-3(b)(1), unless the authorization is terminated  or revoked sooner.       Influenza A by PCR NEGATIVE NEGATIVE Final   Influenza B by PCR NEGATIVE NEGATIVE Final    Comment: (NOTE) The Xpert Xpress SARS-CoV-2/FLU/RSV plus assay is intended as an aid in the diagnosis of influenza from Nasopharyngeal swab specimens and should not be used as a sole basis for treatment. Nasal washings and aspirates are unacceptable for Xpert Xpress SARS-CoV-2/FLU/RSV testing.  Fact Sheet for Patients: EntrepreneurPulse.com.au  Fact Sheet for Healthcare Providers: IncredibleEmployment.be  This test is not yet approved or cleared by the [place] FDA and has been authorized for detection and/or diagnosis of SARS-CoV-2 by FDA under an Emergency Use Authorization (EUA). This EUA will remain in effect (meaning this test can be used) for the duration of the COVID-19 declaration under Section 564(b)(1) of the Act, 21 U.S.C. section 360bbb-3(b)(1), unless the authorization is terminated or revoked.  Performed at [facility], [address], [place], [place] [address]   Urine culture     Status: Abnormal   Collection Time: 09/06/20  2:01 PM   Specimen: In/Out Cath Urine  Result Value Ref Range Status   Specimen Description   Final    IN/OUT CATH URINE Performed at [facility], [address] [place] [address], [place], [place] [address]    Special Requests   Final    NONE Performed at [facility], [address] [place], [place] [address]    Culture (A)  Final    1,000 COLONIES/mL PROTEUS MIRABILIS 3,000 COLONIES/mL KLEBSIELLA PNEUMONIAE    Report Status 09/09/2020 FINAL  Final   Organism ID, Bacteria PROTEUS MIRABILIS (A)  Final   Organism ID, Bacteria KLEBSIELLA PNEUMONIAE (A)  Final      Susceptibility   Klebsiella pneumoniae - MIC*    AMPICILLIN RESISTANT Resistant     CEFAZOLIN <=4 SENSITIVE Sensitive     CEFEPIME <=0.12 SENSITIVE Sensitive     CEFTRIAXONE <=0.25 SENSITIVE Sensitive     CIPROFLOXACIN <=0.25 SENSITIVE Sensitive     GENTAMICIN <=1 SENSITIVE Sensitive     IMIPENEM <=0.25 SENSITIVE Sensitive     NITROFURANTOIN 32 SENSITIVE Sensitive     TRIMETH/SULFA <=20 SENSITIVE Sensitive     AMPICILLIN/SULBACTAM <=2  SENSITIVE Sensitive     PIP/TAZO <=4 SENSITIVE Sensitive     * 3,000 COLONIES/mL KLEBSIELLA PNEUMONIAE   Proteus mirabilis - MIC*    AMPICILLIN <=2 SENSITIVE Sensitive     CEFAZOLIN 8 SENSITIVE Sensitive     CEFEPIME <=0.12 SENSITIVE Sensitive     CEFTRIAXONE <=0.25 SENSITIVE Sensitive     CIPROFLOXACIN >=4 RESISTANT Resistant     GENTAMICIN <=1 SENSITIVE Sensitive     IMIPENEM 2 SENSITIVE Sensitive      NITROFURANTOIN 128 RESISTANT Resistant     TRIMETH/SULFA >=320 RESISTANT Resistant     AMPICILLIN/SULBACTAM <=2 SENSITIVE Sensitive     PIP/TAZO <=4 SENSITIVE Sensitive     * 1,000 COLONIES/mL PROTEUS MIRABILIS  Blood Culture (routine x 2)     Status: Abnormal   Collection Time: 09/06/20  2:14 PM   Specimen: BLOOD  Result Value Ref Range Status   Specimen Description   Final    BLOOD LEFT ANTECUBITAL Performed at [facility], [address] [place], [place] [address]    Special Requests   Final    BOTTLES DRAWN AEROBIC AND ANAEROBIC Blood Culture adequate volume Performed at [facility], [address], [place], [place] [address]    Culture  Setup Time   Final    GRAM POSITIVE COCCI IN BOTH AEROBIC AND ANAEROBIC BOTTLES Gram Stain Report Called to,Read Back By and Verified With: LARIMORE,[email or web address]  BY MATTHEWS, B 3.3.2022 Performed at [facility] ID to follow CRITICAL RESULT CALLED TO, READ BACK BY AND VERIFIED WITHMartin Majestic RN [id number] 09/07/20 A BROWNING Performed at [facility], [place] [address] [place], [place] [address]    Culture STAPHYLOCOCCUS AUREUS (A)  Final   Report Status 09/09/2020 FINAL  Final   Organism ID, Bacteria STAPHYLOCOCCUS AUREUS  Final      Susceptibility   Staphylococcus aureus - MIC*    CIPROFLOXACIN <=0.5 SENSITIVE Sensitive     ERYTHROMYCIN <=0.25 SENSITIVE Sensitive     GENTAMICIN <=0.5 SENSITIVE Sensitive     OXACILLIN <=0.25 SENSITIVE Sensitive     TETRACYCLINE <=1 SENSITIVE Sensitive     VANCOMYCIN <=0.5 SENSITIVE Sensitive     TRIMETH/SULFA <=10 SENSITIVE Sensitive     CLINDAMYCIN <=0.25 SENSITIVE Sensitive     RIFAMPIN <=0.5 SENSITIVE Sensitive     Inducible Clindamycin NEGATIVE Sensitive     * STAPHYLOCOCCUS AUREUS  Blood Culture ID Panel (Reflexed)     Status: Abnormal   Collection Time: 09/06/20  2:14 PM  Result Value Ref Range Status   Enterococcus faecalis NOT DETECTED NOT DETECTED Final   Enterococcus Faecium NOT  DETECTED NOT DETECTED Final   Listeria monocytogenes NOT DETECTED NOT DETECTED Final   Staphylococcus species DETECTED (A) NOT DETECTED Final    Comment: CRITICAL RESULT CALLED TO, READ BACK BY AND VERIFIED WITH: Martin Majestic RN [id number] 09/07/20 A BROWNING    Staphylococcus aureus (BCID) DETECTED (A) NOT DETECTED Final    Comment: CRITICAL RESULT CALLED TO, READ BACK BY AND VERIFIED WITH: Martin Majestic RN [id number] 09/07/20 A BROWNING    Staphylococcus epidermidis NOT DETECTED NOT DETECTED Final   Staphylococcus lugdunensis NOT DETECTED NOT DETECTED Final   Streptococcus species NOT DETECTED NOT DETECTED Final   Streptococcus agalactiae NOT DETECTED NOT DETECTED Final   Streptococcus pneumoniae NOT DETECTED NOT DETECTED Final   Streptococcus pyogenes NOT DETECTED NOT DETECTED Final   A.calcoaceticus-baumannii NOT DETECTED NOT DETECTED Final   Bacteroides fragilis NOT DETECTED NOT DETECTED Final   Enterobacterales NOT DETECTED  NOT DETECTED Final   Enterobacter cloacae complex NOT DETECTED NOT DETECTED Final   Escherichia coli NOT DETECTED NOT DETECTED Final   Klebsiella aerogenes NOT DETECTED NOT DETECTED Final   Klebsiella oxytoca NOT DETECTED NOT DETECTED Final   Klebsiella pneumoniae NOT DETECTED NOT DETECTED Final   Proteus species NOT DETECTED NOT DETECTED Final   Salmonella species NOT DETECTED NOT DETECTED Final   Serratia marcescens NOT DETECTED NOT DETECTED Final   Haemophilus influenzae NOT DETECTED NOT DETECTED Final   Neisseria meningitidis NOT DETECTED NOT DETECTED Final   Pseudomonas aeruginosa NOT DETECTED NOT DETECTED Final   Stenotrophomonas maltophilia NOT DETECTED NOT DETECTED Final   Candida albicans NOT DETECTED NOT DETECTED Final   Candida auris NOT DETECTED NOT DETECTED Final   Candida glabrata NOT DETECTED NOT DETECTED Final   Candida krusei NOT DETECTED NOT DETECTED Final   Candida parapsilosis NOT DETECTED NOT DETECTED Final   Candida tropicalis NOT DETECTED NOT DETECTED Final    Cryptococcus neoformans/gattii NOT DETECTED NOT DETECTED Final   Meth resistant mecA/C and MREJ NOT DETECTED NOT DETECTED Final    Comment: Performed at [facility], [place] [address] [place], [place] [address]  Blood Culture (routine x 2)     Status: None   Collection Time: 09/06/20  3:59 PM   Specimen: BLOOD RIGHT HAND  Result Value Ref Range Status   Specimen Description BLOOD RIGHT HAND  Final   Special Requests   Final    BOTTLES DRAWN AEROBIC AND ANAEROBIC Blood Culture adequate volume   Culture   Final    NO GROWTH 5 DAYS Performed at [facility], [address], [place], [place] [address]    Report Status 09/11/2020 FINAL  Final  MRSA PCR Screening     Status: None   Collection Time: 09/06/20  9:46 PM   Specimen: Nasal Mucosa; Nasopharyngeal  Result Value Ref Range Status   MRSA by PCR NEGATIVE NEGATIVE Final    Comment:        The GeneXpert MRSA Assay (FDA approved for NASAL specimens only), is one component of a comprehensive MRSA colonization surveillance program. It is not intended to diagnose MRSA infection nor to guide or monitor treatment for MRSA infections. Performed at [facility], [address], [place], [place] [address]   Aerobic/Anaerobic Culture w Gram Stain (surgical/deep wound)     Status: None   Collection Time: 09/08/20 11:04 AM   Specimen: PATH Other; Tissue  Result Value Ref Range Status   Specimen Description   Final    FOOT RIGHT Performed at [facility], [address], [place], [place] [address]    Special Requests   Final    NONE Performed at [facility], [address], [place], [place] [address]    Gram Stain   Final    RARE WBC PRESENT, PREDOMINANTLY PMN MODERATE GRAM POSITIVE COCCI IN PAIRS IN CLUSTERS    Culture   Final    ABUNDANT STAPHYLOCOCCUS AUREUS MODERATE ENTEROCOCCUS FAECALIS NO ANAEROBES ISOLATED Performed at [facility], [place] [address] [place], [place] [address]    Report Status 09/14/2020 FINAL   Final   Organism ID, Bacteria STAPHYLOCOCCUS AUREUS  Final   Organism ID, Bacteria ENTEROCOCCUS FAECALIS  Final      Susceptibility   Enterococcus faecalis - MIC*    AMPICILLIN <=2 SENSITIVE Sensitive     VANCOMYCIN 1 SENSITIVE Sensitive     GENTAMICIN SYNERGY SENSITIVE Sensitive     * MODERATE  ENTEROCOCCUS FAECALIS   Staphylococcus aureus - MIC*    CIPROFLOXACIN <=0.5 SENSITIVE Sensitive     ERYTHROMYCIN <=0.25 SENSITIVE Sensitive     GENTAMICIN <=0.5 SENSITIVE Sensitive     OXACILLIN 0.5 SENSITIVE Sensitive     TETRACYCLINE <=1 SENSITIVE Sensitive     VANCOMYCIN <=0.5 SENSITIVE Sensitive     TRIMETH/SULFA <=10 SENSITIVE Sensitive     CLINDAMYCIN <=0.25 SENSITIVE Sensitive     RIFAMPIN <=0.5 SENSITIVE Sensitive     Inducible Clindamycin NEGATIVE Sensitive     * ABUNDANT STAPHYLOCOCCUS AUREUS  Aerobic/Anaerobic Culture w Gram Stain (surgical/deep wound)     Status: None   Collection Time: 09/08/20 11:45 AM   Specimen: PATH Other; Tissue  Result Value Ref Range Status   Specimen Description   Final    FOOT RIGHT Performed at [facility], [address] [place], [place] [address]    Special Requests   Final    NONE Performed at [facility], [address] [place], [place] [address]    Gram Stain   Final    FEW WBC PRESENT, PREDOMINANTLY PMN MODERATE GRAM POSITIVE COCCI IN PAIRS IN CLUSTERS RARE GRAM NEGATIVE RODS    Culture   Final    MODERATE STAPHYLOCOCCUS AUREUS RARE ENTEROCOCCUS FAECALIS RARE ENTEROBACTER CLOACAE NO ANAEROBES ISOLATED Performed at [facility], [address] [place], [place] [address]    Report Status 09/14/2020 FINAL  Final   Organism ID, Bacteria STAPHYLOCOCCUS AUREUS  Final   Organism ID, Bacteria ENTEROCOCCUS FAECALIS  Final   Organism ID, Bacteria ENTEROBACTER CLOACAE  Final      Susceptibility   Enterobacter cloacae - MIC*    CEFAZOLIN >=64 RESISTANT Resistant     CEFEPIME <=0.12 SENSITIVE Sensitive     CEFTAZIDIME <=1 SENSITIVE  Sensitive     CIPROFLOXACIN 1 SENSITIVE Sensitive     GENTAMICIN <=1 SENSITIVE Sensitive     IMIPENEM 0.5 SENSITIVE Sensitive     TRIMETH/SULFA <=20 SENSITIVE Sensitive     PIP/TAZO <=4 SENSITIVE Sensitive     * RARE ENTEROBACTER CLOACAE   Enterococcus faecalis - MIC*    AMPICILLIN <=2 SENSITIVE Sensitive     VANCOMYCIN 1 SENSITIVE Sensitive     GENTAMICIN SYNERGY SENSITIVE Sensitive     * RARE ENTEROCOCCUS FAECALIS   Staphylococcus aureus - MIC*    CIPROFLOXACIN <=0.5 SENSITIVE Sensitive     ERYTHROMYCIN <=0.25 SENSITIVE Sensitive     GENTAMICIN <=0.5 SENSITIVE Sensitive     OXACILLIN <=0.25 SENSITIVE Sensitive     TETRACYCLINE <=1 SENSITIVE Sensitive     VANCOMYCIN <=0.5 SENSITIVE Sensitive     TRIMETH/SULFA <=10 SENSITIVE Sensitive     CLINDAMYCIN <=0.25 SENSITIVE Sensitive     RIFAMPIN <=0.5 SENSITIVE Sensitive     Inducible Clindamycin NEGATIVE Sensitive     * MODERATE STAPHYLOCOCCUS AUREUS  Aerobic/Anaerobic Culture w Gram Stain (surgical/deep wound)     Status: None   Collection Time: 09/08/20 11:46 AM   Specimen: PATH Other; Tissue  Result Value Ref Range Status   Specimen Description   Final    FOOT LEFT Performed at [facility], [address], [place], [place] [address]    Special Requests   Final    NONE Performed at [facility], [address], [place], [place] [address]    Gram Stain   Final    RARE WBC PRESENT, PREDOMINANTLY PMN ABUNDANT GRAM POSITIVE COCCI IN CLUSTERS    Culture   Final    ABUNDANT ENTEROCOCCUS  FAECALIS MODERATE STAPHYLOCOCCUS AUREUS WITHIN MIXED ORGANISMS NO ANAEROBES ISOLATED Performed at [facility], [place] [address], [place], [place] [address]    Report Status 09/14/2020 FINAL  Final   Organism ID, Bacteria STAPHYLOCOCCUS AUREUS  Final   Organism ID, Bacteria ENTEROCOCCUS FAECALIS  Final      Susceptibility   Enterococcus faecalis - MIC*    AMPICILLIN <=2 SENSITIVE Sensitive     VANCOMYCIN 1 SENSITIVE Sensitive      GENTAMICIN SYNERGY SENSITIVE Sensitive     * ABUNDANT ENTEROCOCCUS FAECALIS   Staphylococcus aureus - MIC*    CIPROFLOXACIN <=0.5 SENSITIVE Sensitive     ERYTHROMYCIN <=0.25 SENSITIVE Sensitive     GENTAMICIN <=0.5 SENSITIVE Sensitive     OXACILLIN 0.5 SENSITIVE Sensitive     TETRACYCLINE <=1 SENSITIVE Sensitive     VANCOMYCIN <=0.5 SENSITIVE Sensitive     TRIMETH/SULFA <=10 SENSITIVE Sensitive     CLINDAMYCIN <=0.25 SENSITIVE Sensitive     RIFAMPIN <=0.5 SENSITIVE Sensitive     Inducible Clindamycin NEGATIVE Sensitive     * MODERATE STAPHYLOCOCCUS AUREUS  Aerobic/Anaerobic Culture w Gram Stain (surgical/deep wound)     Status: None (Preliminary result)   Collection Time: 09/10/20  8:25 AM   Specimen: Foot, Left; Tissue  Result Value Ref Range Status   Specimen Description TISSUE LEFT FOOT ULCER SPEC A  Final   Special Requests LEFT FOOT ULCER  Final   Gram Stain   Final    RARE WBC PRESENT, PREDOMINANTLY PMN ABUNDANT GRAM POSITIVE COCCI RARE GRAM NEGATIVE RODS Performed at [facility], [address], [place], [place] [address]    Culture   Final    ABUNDANT STAPHYLOCOCCUS AUREUS FEW STENOTROPHOMONAS MALTOPHILIA RARE ENTEROBACTER CLOACAE NO ANAEROBES ISOLATED; CULTURE IN PROGRESS FOR 5 DAYS    Report Status PENDING  Incomplete   Organism ID, Bacteria STAPHYLOCOCCUS AUREUS  Final   Organism ID, Bacteria STENOTROPHOMONAS MALTOPHILIA  Final   Organism ID, Bacteria ENTEROBACTER CLOACAE  Final      Susceptibility   Enterobacter cloacae - MIC*    CEFAZOLIN >=64 RESISTANT Resistant     CEFEPIME <=0.12 SENSITIVE Sensitive     CEFTAZIDIME <=1 SENSITIVE Sensitive     CIPROFLOXACIN 1 SENSITIVE Sensitive     GENTAMICIN <=1 SENSITIVE Sensitive     IMIPENEM 0.5 SENSITIVE Sensitive     TRIMETH/SULFA <=20 SENSITIVE Sensitive     PIP/TAZO <=4 SENSITIVE Sensitive     * RARE ENTEROBACTER CLOACAE   Staphylococcus aureus - MIC*    CIPROFLOXACIN <=0.5 SENSITIVE Sensitive      ERYTHROMYCIN <=0.25 SENSITIVE Sensitive     GENTAMICIN <=0.5 SENSITIVE Sensitive     OXACILLIN 0.5 SENSITIVE Sensitive     TETRACYCLINE <=1 SENSITIVE Sensitive     VANCOMYCIN <=0.5 SENSITIVE Sensitive     TRIMETH/SULFA <=10 SENSITIVE Sensitive     CLINDAMYCIN <=0.25 SENSITIVE Sensitive     RIFAMPIN <=0.5 SENSITIVE Sensitive     Inducible Clindamycin NEGATIVE Sensitive     * ABUNDANT STAPHYLOCOCCUS AUREUS   Stenotrophomonas maltophilia - MIC*    LEVOFLOXACIN 0.5 SENSITIVE Sensitive     TRIMETH/SULFA <=20 SENSITIVE Sensitive     * FEW STENOTROPHOMONAS MALTOPHILIA  Culture, blood (routine x 2)     Status: None (Preliminary result)   Collection Time: 09/11/20  4:28 AM   Specimen: BLOOD LEFT HAND  Result Value Ref Range Status   Specimen Description BLOOD LEFT HAND  Final   Special Requests   Final  BOTTLES DRAWN AEROBIC ONLY Blood Culture results may not be optimal due to an inadequate volume of blood received in culture bottles   Culture   Final    NO GROWTH 2 DAYS Performed at [facility], [place] [address], [place], [place] [address]    Report Status PENDING  Incomplete  Culture, blood (routine x 2)     Status: None (Preliminary result)   Collection Time: 09/11/20  4:37 AM   Specimen: BLOOD  Result Value Ref Range Status   Specimen Description BLOOD LEFT ANTECUBITAL  Final   Special Requests   Final    BOTTLES DRAWN AEROBIC ONLY Blood Culture results may not be optimal due to an inadequate volume of blood received in culture bottles   Culture   Final    NO GROWTH 2 DAYS Performed at [facility], [place] [address], [place], [place] [address]    Report Status PENDING  Incomplete  Surgical pcr screen     Status: Abnormal   Collection Time: 09/12/20 12:44 PM   Specimen: Nasal Mucosa; Nasal Swab  Result Value Ref Range Status   MRSA, PCR NEGATIVE NEGATIVE Final   Staphylococcus aureus POSITIVE (A) NEGATIVE Final    Comment: (NOTE) The Xpert SA Assay (FDA approved for  NASAL specimens in patients 57 years of age and older), is one component of a comprehensive surveillance program. It is not intended to diagnose infection nor to guide or monitor treatment. Performed at [facility], [place] [address] [place], [place] [address]       Imaging Studies   No results found.   Medications   Scheduled Meds: . sodium chloride   Intravenous Once  . sodium chloride   Intravenous Once  . atorvastatin  40 mg Oral QHS  . Chlorhexidine Gluconate Cloth  6 each Topical Daily  . Chlorhexidine Gluconate Cloth  6 each Topical Q0600  . cholecalciferol  5,000 Units Oral Daily  . docusate sodium  100 mg Oral BID  . enoxaparin (LOVENOX) injection  70 mg Subcutaneous Q24H  . FLUoxetine  40 mg Oral Daily  . insulin aspart  0-20 Units Subcutaneous TID WC  . insulin aspart  15 Units Subcutaneous TID WC  . insulin glargine  28 Units Subcutaneous BID  . levothyroxine  150 mcg Oral QAC breakfast  . magnesium oxide  200 mg Oral Daily  . metoprolol succinate  100 mg Oral QHS  . mupirocin ointment  1 application Nasal BID  . pantoprazole  40 mg Oral Daily  . senna  1 tablet Oral BID  . sodium chloride flush  10-40 mL Intracatheter Q12H  . sodium zirconium cyclosilicate  10 g Oral Once   Continuous Infusions: . sodium chloride Stopped (09/13/20 1637)  . sodium chloride    . sodium chloride    .  ceFAZolin (ANCEF) IV 2 g (09/14/20 0556)  . methocarbamol (ROBAXIN) IV         LOS: 8 days    Time spent: 30 minutes    Ezekiel Slocumb, DO [facility]  09/14/2020, 2:39 PM      If 7PM-7AM, please contact night-coverage. How to contact the [facility] Attending or Consulting provider [place] or covering provider during after hours [place], for this patient?    1. Check the care team in [facility] and look for a) attending/consulting [facility] provider listed and b) the [facility] team listed 2. Log into [email or web address] and use [place]'s universal password to  access. If you  do not have the password, please contact the hospital operator. 3. Locate the [facility] provider you are looking for under [facility] and page to a number that you can be directly reached. 4. If you still have difficulty reaching the provider, please page the [facility] (Director on Call) for the Hospitalists listed on amion for assistance.

## 2020-09-14 NOTE — Progress Notes (Signed)
[facility]ious [facility]  Date of Admission:  09/06/2020     Total days of antibiotics: 9         ASSESSMENT:  [name] is POD #1 from bilateral below knee amputation in the setting of bilateral massive ulceration of bilateral feet complicated by MSSA bacteremia. Source control now achieved with amputations. Blood cultures from 3/7 have remained without growth to date and okay for PICC line if cultures remain negative. TTE without evidence of endocarditis. Will plan for 4 weeks of Cefazolin through 4/4. Discharge likely to skilled nursing. Continue wound care per orthopedics. Will arrange follow up in ID office.  PLAN:  1. Continue cefazolin through 10/09/20.  2. OPAT and Home Health orders below - with possible DC to skilled nursing. 3. Wound care per orthopedics.  4. Follow up in the ID office.  Diagnosis: Bilateral ulcerations s/p bilateral BKA complicated by MSSA bacteremia   Culture Result: MSSA  Allergies  Allergen Reactions  . Sulfa Antibiotics Diarrhea, Nausea And Vomiting and Rash  . Clindamycin/Lincomycin Diarrhea and Nausea And Vomiting  . Invokana [Canagliflozin] Diarrhea and Nausea And Vomiting  . Latex     Rash, itching, over a long period of time  . Tape Itching and Swelling    Reaction after a few days use/ please use paper tape  . Bactrim [Sulfamethoxazole-Trimethoprim] Diarrhea, Nausea And Vomiting and Rash  . Cherry Itching, Swelling and Rash    Throat swelling  . Gabapentin Diarrhea, Nausea And Vomiting and Rash  . Other Itching, Swelling and Rash    Reaction to Hot peppers (throat swelling)    OPAT Orders Discharge antibiotics to be given via PICC line Discharge antibiotics: Cefazolin  Per pharmacy protocol  Aim for Vancomycin trough 15-20 or AUC 400-550 (unless otherwise indicated)  Duration: 4 weeks   End Date: 10/09/20  [facility] Care Per Protocol:  Home health RN for IV administration and teaching; PICC line care and labs.     Labs weekly while on IV antibiotics: _X_ CBC with differential _X_ BMP __ CMP _X_ CRP _X_ ESR __ Vancomycin trough __ CK  __ Please pull PIC at completion of IV antibiotics _X_ Please leave PIC in place until doctor has seen patient or been notified  Fax weekly labs to [phone]  Clinic Follow Up Appt:  10/04/20 at 3pm with Michelle Piedra, NP   Principal Problem:   Plantar ulcer of right foot ([place]) Active Problems:   Uncontrolled type 2 diabetes mellitus with complication, with long-term current use of insulin ([place])   Morbid (severe) obesity due to excess calories ([place])   Primary hypothyroidism   Mixed hyperlipidemia   Essential hypertension, benign   Depression   GERD (gastroesophageal reflux disease)   Cellulitis   Sepsis ([place])   Plantar ulcer of left foot ([place])   Lymphedema   Severe protein-calorie malnutrition ([place])   BMI 50.0-59.9, adult ([place])   MSSA bacteremia   Acute hematogenous osteomyelitis of both feet ([place])   . sodium chloride   Intravenous Once  . sodium chloride   Intravenous Once  . atorvastatin  40 mg Oral QHS  . Chlorhexidine Gluconate Cloth  6 each Topical Daily  . Chlorhexidine Gluconate Cloth  6 each Topical Q0600  . cholecalciferol  5,000 Units Oral Daily  . docusate sodium  100 mg Oral BID  . enoxaparin (LOVENOX) injection  70 mg Subcutaneous Q24H  . FLUoxetine  40 mg Oral Daily  . insulin aspart  0-20 Units  Subcutaneous TID WC  . insulin aspart  15 Units Subcutaneous TID WC  . insulin aspart  25 Units Subcutaneous Once  . insulin glargine  28 Units Subcutaneous BID  . levothyroxine  150 mcg Oral QAC breakfast  . magnesium oxide  200 mg Oral Daily  . metoprolol succinate  100 mg Oral QHS  . mupirocin ointment  1 application Nasal BID  . pantoprazole  40 mg Oral Daily  . senna  1 tablet Oral BID  . sodium chloride flush  10-40 mL Intracatheter Q12H  . sodium zirconium cyclosilicate  10 g Oral Once    SUBJECTIVE:  Afebrile  overnight and POD 1 from bilateral BKA. Received 3 units PRBC. Feeling tired.    Allergies  Allergen Reactions  . Sulfa Antibiotics Diarrhea, Nausea And Vomiting and Rash  . Clindamycin/Lincomycin Diarrhea and Nausea And Vomiting  . Invokana [Canagliflozin] Diarrhea and Nausea And Vomiting  . Latex     Rash, itching, over a long period of time  . Tape Itching and Swelling    Reaction after a few days use/ please use paper tape  . Bactrim [Sulfamethoxazole-Trimethoprim] Diarrhea, Nausea And Vomiting and Rash  . Cherry Itching, Swelling and Rash    Throat swelling  . Gabapentin Diarrhea, Nausea And Vomiting and Rash  . Other Itching, Swelling and Rash    Reaction to Hot peppers (throat swelling)     Review of Systems: Review of Systems  Constitutional: Negative for chills, fever and weight loss.  Respiratory: Negative for cough, shortness of breath and wheezing.   Cardiovascular: Negative for chest pain and leg swelling.  Gastrointestinal: Negative for abdominal pain, constipation, diarrhea, nausea and vomiting.  Musculoskeletal:       Positive for surgical site pain bilaterally  Skin: Negative for rash.      OBJECTIVE: Vitals:   09/13/20 2352 09/14/20 0015 09/14/20 0320 09/14/20 0805  BP: 140/74 (!) 120/59 (!) 146/58 129/85  Pulse: 60 (!) 55 (!) 55 65  Resp: '16 17 18 17  ' Temp: (!) 97.4 F (36.3 C) (!) 97.3 F (36.3 C) 97.7 F (36.5 C) 98.4 F (36.9 C)  TempSrc: Oral Oral Oral Oral  SpO2: 100% 100% 100% 100%  Weight:      Height:       Body mass index is 50.35 kg/m.  Physical Exam Constitutional:      General: She is not in acute distress.    Appearance: She is well-developed. She is obese.     Comments: Lying in bed with head of bed elevated.   Cardiovascular:     Rate and Rhythm: Normal rate and regular rhythm.     Heart sounds: Normal heart sounds.  Pulmonary:     Effort: Pulmonary effort is normal.     Breath sounds: Normal breath sounds.   Musculoskeletal:     Comments: Bilateral Wound Vac's with scant drainage bilaterally.   Skin:    General: Skin is warm and dry.  Neurological:     Mental Status: She is alert and oriented to person, place, and time.     Comments: Groggy/lethargic.   Psychiatric:        Behavior: Behavior normal.        Thought Content: Thought content normal.        Judgment: Judgment normal.     Lab Results Lab Results  Component Value Date   WBC 12.8 (H) 09/14/2020   HGB 7.7 (L) 09/14/2020   HCT 25.0 (L) 09/14/2020   MCV  91.6 09/14/2020   PLT 334 09/14/2020    Lab Results  Component Value Date   CREATININE 1.40 (H) 09/14/2020   BUN 31 (H) 09/14/2020   NA 134 (L) 09/14/2020   K 5.7 (H) 09/14/2020   CL 103 09/14/2020   CO2 26 09/14/2020    Lab Results  Component Value Date   ALT 6 09/14/2020   AST 19 09/14/2020   ALKPHOS 76 09/14/2020   BILITOT 0.1 (L) 09/14/2020     Microbiology: Recent Results (from the past 240 hour(s))  Resp Panel by RT-PCR (Flu A&B, Covid) Nasopharyngeal Swab     Status: None   Collection Time: 09/06/20  2:01 PM   Specimen: Nasopharyngeal Swab; Nasopharyngeal(NP) swabs in vial transport medium  Result Value Ref Range Status   SARS Coronavirus 2 by RT PCR NEGATIVE NEGATIVE Final    Comment: (NOTE) SARS-CoV-2 target nucleic acids are NOT DETECTED.  The SARS-CoV-2 RNA is generally detectable in upper respiratory specimens during the acute phase of infection. The lowest concentration of SARS-CoV-2 viral copies this assay can detect is 138 copies/mL. A negative result does not preclude SARS-Cov-2 infection and should not be used as the sole basis for treatment or other patient management decisions. A negative result may occur with  improper specimen collection/handling, submission of specimen other than nasopharyngeal swab, presence of viral mutation(s) within the areas targeted by this assay, and inadequate number of viral copies(<138 copies/mL). A  negative result must be combined with clinical observations, patient history, and epidemiological information. The expected result is Negative.  Fact Sheet for Patients:  EntrepreneurPulse.com.au  Fact Sheet for Healthcare Providers:  IncredibleEmployment.be  This test is no t yet approved or cleared by the [place] FDA and  has been authorized for detection and/or diagnosis of SARS-CoV-2 by FDA under an Emergency Use Authorization (EUA). This EUA will remain  in effect (meaning this test can be used) for the duration of the COVID-19 declaration under Section 564(b)(1) of the Act, 21 U.S.C.section 360bbb-3(b)(1), unless the authorization is terminated  or revoked sooner.       Influenza A by PCR NEGATIVE NEGATIVE Final   Influenza B by PCR NEGATIVE NEGATIVE Final    Comment: (NOTE) The Xpert Xpress SARS-CoV-2/FLU/RSV plus assay is intended as an aid in the diagnosis of influenza from Nasopharyngeal swab specimens and should not be used as a sole basis for treatment. Nasal washings and aspirates are unacceptable for Xpert Xpress SARS-CoV-2/FLU/RSV testing.  Fact Sheet for Patients: EntrepreneurPulse.com.au  Fact Sheet for Healthcare Providers: IncredibleEmployment.be  This test is not yet approved or cleared by the [place] FDA and has been authorized for detection and/or diagnosis of SARS-CoV-2 by FDA under an Emergency Use Authorization (EUA). This EUA will remain in effect (meaning this test can be used) for the duration of the COVID-19 declaration under Section 564(b)(1) of the Act, 21 U.S.C. section 360bbb-3(b)(1), unless the authorization is terminated or revoked.  Performed at [facility], [address] [place], [place] [address]   Urine culture     Status: Abnormal   Collection Time: 09/06/20  2:01 PM   Specimen: In/Out Cath Urine  Result Value Ref Range Status   Specimen  Description   Final    IN/OUT CATH URINE Performed at [facility], [address], [place], [place] [address]    Special Requests   Final    NONE Performed at [facility], [address] [place], [place] [address]    Culture (A)  Final  1,000 COLONIES/mL PROTEUS MIRABILIS 3,000 COLONIES/mL KLEBSIELLA PNEUMONIAE    Report Status 09/09/2020 FINAL  Final   Organism ID, Bacteria PROTEUS MIRABILIS (A)  Final   Organism ID, Bacteria KLEBSIELLA PNEUMONIAE (A)  Final      Susceptibility   Klebsiella pneumoniae - MIC*    AMPICILLIN RESISTANT Resistant     CEFAZOLIN <=4 SENSITIVE Sensitive     CEFEPIME <=0.12 SENSITIVE Sensitive     CEFTRIAXONE <=0.25 SENSITIVE Sensitive     CIPROFLOXACIN <=0.25 SENSITIVE Sensitive     GENTAMICIN <=1 SENSITIVE Sensitive     IMIPENEM <=0.25 SENSITIVE Sensitive     NITROFURANTOIN 32 SENSITIVE Sensitive     TRIMETH/SULFA <=20 SENSITIVE Sensitive     AMPICILLIN/SULBACTAM <=2 SENSITIVE Sensitive     PIP/TAZO <=4 SENSITIVE Sensitive     * 3,000 COLONIES/mL KLEBSIELLA PNEUMONIAE   Proteus mirabilis - MIC*    AMPICILLIN <=2 SENSITIVE Sensitive     CEFAZOLIN 8 SENSITIVE Sensitive     CEFEPIME <=0.12 SENSITIVE Sensitive     CEFTRIAXONE <=0.25 SENSITIVE Sensitive     CIPROFLOXACIN >=4 RESISTANT Resistant     GENTAMICIN <=1 SENSITIVE Sensitive     IMIPENEM 2 SENSITIVE Sensitive     NITROFURANTOIN 128 RESISTANT Resistant     TRIMETH/SULFA >=320 RESISTANT Resistant     AMPICILLIN/SULBACTAM <=2 SENSITIVE Sensitive     PIP/TAZO <=4 SENSITIVE Sensitive     * 1,000 COLONIES/mL PROTEUS MIRABILIS  Blood Culture (routine x 2)     Status: Abnormal   Collection Time: 09/06/20  2:14 PM   Specimen: BLOOD  Result Value Ref Range Status   Specimen Description   Final    BLOOD LEFT ANTECUBITAL Performed at [facility], [address], [place], [place] [address]    Special Requests   Final    BOTTLES DRAWN AEROBIC AND ANAEROBIC Blood Culture adequate volume Performed  at [facility], [address] [place], [place] [address]    Culture  Setup Time   Final    GRAM POSITIVE COCCI IN BOTH AEROBIC AND ANAEROBIC BOTTLES Gram Stain Report Called to,Read Back By and Verified With: LARIMORE,A'@0725'  BY MATTHEWS, B 3.3.2022 Performed at [facility] ID to follow CRITICAL RESULT CALLED TO, READ BACK BY AND VERIFIED WITHMartin Majestic RN [id number] 09/07/20 A BROWNING Performed at [facility], [address] [place], [place] [address]    Culture STAPHYLOCOCCUS AUREUS (A)  Final   Report Status 09/09/2020 FINAL  Final   Organism ID, Bacteria STAPHYLOCOCCUS AUREUS  Final      Susceptibility   Staphylococcus aureus - MIC*    CIPROFLOXACIN <=0.5 SENSITIVE Sensitive     ERYTHROMYCIN <=0.25 SENSITIVE Sensitive     GENTAMICIN <=0.5 SENSITIVE Sensitive     OXACILLIN <=0.25 SENSITIVE Sensitive     TETRACYCLINE <=1 SENSITIVE Sensitive     VANCOMYCIN <=0.5 SENSITIVE Sensitive     TRIMETH/SULFA <=10 SENSITIVE Sensitive     CLINDAMYCIN <=0.25 SENSITIVE Sensitive     RIFAMPIN <=0.5 SENSITIVE Sensitive     Inducible Clindamycin NEGATIVE Sensitive     * STAPHYLOCOCCUS AUREUS  Blood Culture ID Panel (Reflexed)     Status: Abnormal   Collection Time: 09/06/20  2:14 PM  Result Value Ref Range Status   Enterococcus faecalis NOT DETECTED NOT DETECTED Final   Enterococcus Faecium NOT DETECTED NOT DETECTED Final   Listeria monocytogenes NOT DETECTED NOT DETECTED Final   Staphylococcus species DETECTED (A) NOT DETECTED Final    Comment: CRITICAL RESULT  CALLED TO, READ BACK BY AND VERIFIED WITH: Martin Majestic RN [id number] 09/07/20 A BROWNING    Staphylococcus aureus (BCID) DETECTED (A) NOT DETECTED Final    Comment: CRITICAL RESULT CALLED TO, READ BACK BY AND VERIFIED WITH: Martin Majestic RN [id number] 09/07/20 A BROWNING    Staphylococcus epidermidis NOT DETECTED NOT DETECTED Final   Staphylococcus lugdunensis NOT DETECTED NOT DETECTED Final   Streptococcus species NOT DETECTED NOT  DETECTED Final   Streptococcus agalactiae NOT DETECTED NOT DETECTED Final   Streptococcus pneumoniae NOT DETECTED NOT DETECTED Final   Streptococcus pyogenes NOT DETECTED NOT DETECTED Final   A.calcoaceticus-baumannii NOT DETECTED NOT DETECTED Final   Bacteroides fragilis NOT DETECTED NOT DETECTED Final   Enterobacterales NOT DETECTED NOT DETECTED Final   Enterobacter cloacae complex NOT DETECTED NOT DETECTED Final   Escherichia coli NOT DETECTED NOT DETECTED Final   Klebsiella aerogenes NOT DETECTED NOT DETECTED Final   Klebsiella oxytoca NOT DETECTED NOT DETECTED Final   Klebsiella pneumoniae NOT DETECTED NOT DETECTED Final   Proteus species NOT DETECTED NOT DETECTED Final   Salmonella species NOT DETECTED NOT DETECTED Final   Serratia marcescens NOT DETECTED NOT DETECTED Final   Haemophilus influenzae NOT DETECTED NOT DETECTED Final   Neisseria meningitidis NOT DETECTED NOT DETECTED Final   Pseudomonas aeruginosa NOT DETECTED NOT DETECTED Final   Stenotrophomonas maltophilia NOT DETECTED NOT DETECTED Final   Candida albicans NOT DETECTED NOT DETECTED Final   Candida auris NOT DETECTED NOT DETECTED Final   Candida glabrata NOT DETECTED NOT DETECTED Final   Candida krusei NOT DETECTED NOT DETECTED Final   Candida parapsilosis NOT DETECTED NOT DETECTED Final   Candida tropicalis NOT DETECTED NOT DETECTED Final   Cryptococcus neoformans/gattii NOT DETECTED NOT DETECTED Final   Meth resistant mecA/C and MREJ NOT DETECTED NOT DETECTED Final    Comment: Performed at [facility], [address] [place], [place] [address]  Blood Culture (routine x 2)     Status: None   Collection Time: 09/06/20  3:59 PM   Specimen: BLOOD RIGHT HAND  Result Value Ref Range Status   Specimen Description BLOOD RIGHT HAND  Final   Special Requests   Final    BOTTLES DRAWN AEROBIC AND ANAEROBIC Blood Culture adequate volume   Culture   Final    NO GROWTH 5 DAYS Performed at [facility], [address], [place], [place] [address]    Report Status 09/11/2020 FINAL  Final  MRSA PCR Screening     Status: None   Collection Time: 09/06/20  9:46 PM   Specimen: Nasal Mucosa; Nasopharyngeal  Result Value Ref Range Status   MRSA by PCR NEGATIVE NEGATIVE Final    Comment:        The GeneXpert MRSA Assay (FDA approved for NASAL specimens only), is one component of a comprehensive MRSA colonization surveillance program. It is not intended to diagnose MRSA infection nor to guide or monitor treatment for MRSA infections. Performed at [facility], [address] [place], [place] [address]   Aerobic/Anaerobic Culture w Gram Stain (surgical/deep wound)     Status: None (Preliminary result)   Collection Time: 09/08/20 11:04 AM   Specimen: PATH Other; Tissue  Result Value Ref Range Status   Specimen Description   Final    FOOT RIGHT Performed at [facility], [address], [place],  [address]    Special Requests   Final    NONE Performed at [facility], [address] [place],  [place] [address]    Gram Stain   Final    RARE WBC PRESENT, PREDOMINANTLY PMN MODERATE GRAM POSITIVE COCCI IN PAIRS IN CLUSTERS Performed at [facility], [place] [address] [place], [place] [address]    Culture   Final    ABUNDANT STAPHYLOCOCCUS AUREUS MODERATE ENTEROCOCCUS FAECALIS NO ANAEROBES ISOLATED; CULTURE IN PROGRESS FOR 5 DAYS    Report Status PENDING  Incomplete   Organism ID, Bacteria STAPHYLOCOCCUS AUREUS  Final   Organism ID, Bacteria ENTEROCOCCUS FAECALIS  Final      Susceptibility   Enterococcus faecalis - MIC*    AMPICILLIN <=2 SENSITIVE Sensitive     VANCOMYCIN 1 SENSITIVE Sensitive     GENTAMICIN SYNERGY SENSITIVE Sensitive     * MODERATE ENTEROCOCCUS FAECALIS   Staphylococcus aureus - MIC*    CIPROFLOXACIN <=0.5 SENSITIVE Sensitive     ERYTHROMYCIN <=0.25 SENSITIVE Sensitive     GENTAMICIN <=0.5 SENSITIVE Sensitive     OXACILLIN 0.5 SENSITIVE Sensitive     TETRACYCLINE  <=1 SENSITIVE Sensitive     VANCOMYCIN <=0.5 SENSITIVE Sensitive     TRIMETH/SULFA <=10 SENSITIVE Sensitive     CLINDAMYCIN <=0.25 SENSITIVE Sensitive     RIFAMPIN <=0.5 SENSITIVE Sensitive     Inducible Clindamycin NEGATIVE Sensitive     * ABUNDANT STAPHYLOCOCCUS AUREUS  Aerobic/Anaerobic Culture w Gram Stain (surgical/deep wound)     Status: None (Preliminary result)   Collection Time: 09/08/20 11:45 AM   Specimen: PATH Other; Tissue  Result Value Ref Range Status   Specimen Description   Final    FOOT RIGHT Performed at [facility], [address] [place], [place] [address]    Special Requests   Final    NONE Performed at [facility], [address], [place], [place] [address]    Gram Stain   Final    FEW WBC PRESENT, PREDOMINANTLY PMN MODERATE GRAM POSITIVE COCCI IN PAIRS IN CLUSTERS RARE GRAM NEGATIVE RODS Performed at [facility], [place] [address] [place], [place] [address]    Culture   Final    MODERATE STAPHYLOCOCCUS AUREUS RARE ENTEROCOCCUS FAECALIS RARE ENTEROBACTER CLOACAE NO ANAEROBES ISOLATED; CULTURE IN PROGRESS FOR 5 DAYS    Report Status PENDING  Incomplete   Organism ID, Bacteria STAPHYLOCOCCUS AUREUS  Final   Organism ID, Bacteria ENTEROCOCCUS FAECALIS  Final   Organism ID, Bacteria ENTEROBACTER CLOACAE  Final      Susceptibility   Enterobacter cloacae - MIC*    CEFAZOLIN >=64 RESISTANT Resistant     CEFEPIME <=0.12 SENSITIVE Sensitive     CEFTAZIDIME <=1 SENSITIVE Sensitive     CIPROFLOXACIN 1 SENSITIVE Sensitive     GENTAMICIN <=1 SENSITIVE Sensitive     IMIPENEM 0.5 SENSITIVE Sensitive     TRIMETH/SULFA <=20 SENSITIVE Sensitive     PIP/TAZO <=4 SENSITIVE Sensitive     * RARE ENTEROBACTER CLOACAE   Enterococcus faecalis - MIC*    AMPICILLIN <=2 SENSITIVE Sensitive     VANCOMYCIN 1 SENSITIVE Sensitive     GENTAMICIN SYNERGY SENSITIVE Sensitive     * RARE ENTEROCOCCUS FAECALIS   Staphylococcus aureus - MIC*    CIPROFLOXACIN <=0.5 SENSITIVE  Sensitive     ERYTHROMYCIN <=0.25 SENSITIVE Sensitive     GENTAMICIN <=0.5 SENSITIVE Sensitive     OXACILLIN <=0.25 SENSITIVE Sensitive     TETRACYCLINE <=1 SENSITIVE Sensitive     VANCOMYCIN <=0.5 SENSITIVE Sensitive     TRIMETH/SULFA <=10 SENSITIVE Sensitive     CLINDAMYCIN <=0.25  SENSITIVE Sensitive     RIFAMPIN <=0.5 SENSITIVE Sensitive     Inducible Clindamycin NEGATIVE Sensitive     * MODERATE STAPHYLOCOCCUS AUREUS  Aerobic/Anaerobic Culture w Gram Stain (surgical/deep wound)     Status: None (Preliminary result)   Collection Time: 09/08/20 11:46 AM   Specimen: PATH Other; Tissue  Result Value Ref Range Status   Specimen Description   Final    FOOT LEFT Performed at [facility], [address], [place], [place] [address]    Special Requests   Final    NONE Performed at [facility], [address], [place], [place] [address]    Gram Stain   Final    RARE WBC PRESENT, PREDOMINANTLY PMN ABUNDANT GRAM POSITIVE COCCI IN CLUSTERS Performed at [facility], [place] [address] [place], [place] [address]    Culture   Final    ABUNDANT ENTEROCOCCUS FAECALIS MODERATE STAPHYLOCOCCUS AUREUS WITHIN MIXED ORGANISMS NO ANAEROBES ISOLATED; CULTURE IN PROGRESS FOR 5 DAYS    Report Status PENDING  Incomplete   Organism ID, Bacteria STAPHYLOCOCCUS AUREUS  Final   Organism ID, Bacteria ENTEROCOCCUS FAECALIS  Final      Susceptibility   Enterococcus faecalis - MIC*    AMPICILLIN <=2 SENSITIVE Sensitive     VANCOMYCIN 1 SENSITIVE Sensitive     GENTAMICIN SYNERGY SENSITIVE Sensitive     * ABUNDANT ENTEROCOCCUS FAECALIS   Staphylococcus aureus - MIC*    CIPROFLOXACIN <=0.5 SENSITIVE Sensitive     ERYTHROMYCIN <=0.25 SENSITIVE Sensitive     GENTAMICIN <=0.5 SENSITIVE Sensitive     OXACILLIN 0.5 SENSITIVE Sensitive     TETRACYCLINE <=1 SENSITIVE Sensitive     VANCOMYCIN <=0.5 SENSITIVE Sensitive     TRIMETH/SULFA <=10 SENSITIVE Sensitive     CLINDAMYCIN <=0.25 SENSITIVE Sensitive      RIFAMPIN <=0.5 SENSITIVE Sensitive     Inducible Clindamycin NEGATIVE Sensitive     * MODERATE STAPHYLOCOCCUS AUREUS  Aerobic/Anaerobic Culture w Gram Stain (surgical/deep wound)     Status: None (Preliminary result)   Collection Time: 09/10/20  8:25 AM   Specimen: Foot, Left; Tissue  Result Value Ref Range Status   Specimen Description TISSUE LEFT FOOT ULCER SPEC A  Final   Special Requests LEFT FOOT ULCER  Final   Gram Stain   Final    RARE WBC PRESENT, PREDOMINANTLY PMN ABUNDANT GRAM POSITIVE COCCI RARE GRAM NEGATIVE RODS Performed at [facility], [address], [place], [place] [address]    Culture   Final    ABUNDANT STAPHYLOCOCCUS AUREUS FEW STENOTROPHOMONAS MALTOPHILIA RARE ENTEROBACTER CLOACAE NO ANAEROBES ISOLATED; CULTURE IN PROGRESS FOR 5 DAYS    Report Status PENDING  Incomplete   Organism ID, Bacteria STAPHYLOCOCCUS AUREUS  Final   Organism ID, Bacteria STENOTROPHOMONAS MALTOPHILIA  Final   Organism ID, Bacteria ENTEROBACTER CLOACAE  Final      Susceptibility   Enterobacter cloacae - MIC*    CEFAZOLIN >=64 RESISTANT Resistant     CEFEPIME <=0.12 SENSITIVE Sensitive     CEFTAZIDIME <=1 SENSITIVE Sensitive     CIPROFLOXACIN 1 SENSITIVE Sensitive     GENTAMICIN <=1 SENSITIVE Sensitive     IMIPENEM 0.5 SENSITIVE Sensitive     TRIMETH/SULFA <=20 SENSITIVE Sensitive     PIP/TAZO <=4 SENSITIVE Sensitive     * RARE ENTEROBACTER CLOACAE   Staphylococcus aureus - MIC*    CIPROFLOXACIN <=0.5 SENSITIVE Sensitive     ERYTHROMYCIN <=0.25 SENSITIVE Sensitive     GENTAMICIN <=0.5 SENSITIVE  Sensitive     OXACILLIN 0.5 SENSITIVE Sensitive     TETRACYCLINE <=1 SENSITIVE Sensitive     VANCOMYCIN <=0.5 SENSITIVE Sensitive     TRIMETH/SULFA <=10 SENSITIVE Sensitive     CLINDAMYCIN <=0.25 SENSITIVE Sensitive     RIFAMPIN <=0.5 SENSITIVE Sensitive     Inducible Clindamycin NEGATIVE Sensitive     * ABUNDANT STAPHYLOCOCCUS AUREUS   Stenotrophomonas maltophilia - MIC*     LEVOFLOXACIN 0.5 SENSITIVE Sensitive     TRIMETH/SULFA <=20 SENSITIVE Sensitive     * FEW STENOTROPHOMONAS MALTOPHILIA  Culture, blood (routine x 2)     Status: None (Preliminary result)   Collection Time: 09/11/20  4:28 AM   Specimen: BLOOD LEFT HAND  Result Value Ref Range Status   Specimen Description BLOOD LEFT HAND  Final   Special Requests   Final    BOTTLES DRAWN AEROBIC ONLY Blood Culture results may not be optimal due to an inadequate volume of blood received in culture bottles   Culture   Final    NO GROWTH 2 DAYS Performed at [facility] [address] [place], [place] [address]    Report Status PENDING  Incomplete  Culture, blood (routine x 2)     Status: None (Preliminary result)   Collection Time: 09/11/20  4:37 AM   Specimen: BLOOD  Result Value Ref Range Status   Specimen Description BLOOD LEFT ANTECUBITAL  Final   Special Requests   Final    BOTTLES DRAWN AEROBIC ONLY Blood Culture results may not be optimal due to an inadequate volume of blood received in culture bottles   Culture   Final    NO GROWTH 2 DAYS Performed at [facility], [place] [address] [place], [place] [address]    Report Status PENDING  Incomplete  Surgical pcr screen     Status: Abnormal   Collection Time: 09/12/20 12:44 PM   Specimen: Nasal Mucosa; Nasal Swab  Result Value Ref Range Status   MRSA, PCR NEGATIVE NEGATIVE Final   Staphylococcus aureus POSITIVE (A) NEGATIVE Final    Comment: (NOTE) The Xpert SA Assay (FDA approved for NASAL specimens in patients 59 years of age and older), is one component of a comprehensive surveillance program. It is not intended to diagnose infection nor to guide or monitor treatment. Performed at [facility], [place] [address], [place], [place] [address]      Michelle Skinner, [facility]  09/14/2020  11:10 AM

## 2020-09-14 NOTE — Progress Notes (Signed)
Patient ID: Michelle Skinner, female   DOB: [birth year], 55 y.o.   MRN: [id number] Patient is postoperative day 1 bilateral transtibial amputations.  Patient is much more alert this morning than she was preoperatively.  Patient did receive 3 units of packed red blood cells yesterday her hemoglobin is 7.7.  There is minimal drainage in the wound VAC canister on the left right canister has no drainage.  Plan for discharge to skilled nursing facility.  Patient's husband is also an amputee and would not be able to care for her.  Patient's blood glucose is elevated this morning plan to resume her long-acting insulin.

## 2020-09-14 NOTE — Progress Notes (Signed)
IP rehab admissions - I met with patient.  She is in agreement with Dr. Sharol Given that she will need SNF placement since husband is also an amputee and cannot give much assistance.  Please see Dr. Jess Barters progress note from 3/10.  Call for questions.  [phone]

## 2020-09-14 NOTE — Progress Notes (Signed)
Inpatient Diabetes Program Recommendations  AACE/ADA: New Consensus Statement on Inpatient Glycemic Control (2015)  Target Ranges:  Prepandial:   less than 140 mg/dL      Peak postprandial:   less than 180 mg/dL (1-2 hours)      Critically ill patients:  140 - 180 mg/dL   Lab Results  Component Value Date   GLUCAP 150 (H) 09/14/2020   HGBA1C 10.1 (H) 07/03/2020    Review of Glycemic Control  Diabetes history: DM2 Outpatient Diabetes medications: Tresiba 100 units QHS, Novolog 10 units TID with meals, previously on U-500 50-60 units TID with meals Current orders for Inpatient glycemic control: Lantus 28 units BID, Novolog 0-20 units TID with meals + 15 units TID with meals  Inpatient Diabetes Program Recommendations:     Increase Lantus to 34 units BID - Titrate if FBS > 180 mg/dL.  Pt is POD #1 bilateral transtibial amputations. Received Decadron 5 mg in surgery on 3/09.   If post-prandials > 180 mg/dL on 3/11, increase Novolog to 20 units TID with meals if eating > 50%.  Continue to follow.  Thank you. Lorenda Peck, RD, LDN, CDE Inpatient Diabetes Coordinator [phone]

## 2020-09-14 NOTE — Progress Notes (Signed)
Patient CBG 404 at [phone], MD Sharol Given notified face to face at 0730 and asked writer to give scheduled doses of insulin and reassess CBG. Pt CBG 395 at 0945, Nicole Kindred notified at 1045 and placed verbal order for 25 units of insulin. Writer spoke with Arbutus Ped at 1120 face to face for clarification about which insulin dose to give, one time 25 or scheduled noon insulin. Arbutus Ped confirmed to give the scheduled insulin for a total of 35 units at 1126. CBG rechecked at 1256 for reading of 291, Griffith notified of reading and asked writer to hold off on giving more insulin and reassess with next scheduled CBG check. Griffith placed verbal order to discontinue one time dose of 25 units of insulin.

## 2020-09-14 NOTE — Progress Notes (Signed)
Physical Therapy Evaluation Patient Details Name: Michelle Skinner MRN: [id number] DOB: 1965/12/20 Today's Date: 09/14/2020   History of Present Illness  Pt is a 55 y.o. female who presented to the ED 09/06/20 with open wounds at bil feet. Per chart, 3 weeks PTA she sustained a "bone bruise" of her R lateral foot that progressed to increased redness and pain and later spread to her L foot. Pt found to be septic 2/2 cellulitis involving feet with lactic acidosis, leukocytosism, AKI, tachycardia, and tachypnea. S/p sharp debridement of wounds 09/08/20 and irrigation with debridement 09/10/20. BL BK amputations were done on 09/13/20, re-referred to PT.  PMH: morbid obesity, DM2 on insulin w/ peripheral neuropathy, HTN, GERD, and cervical cancer.  Clinical Impression  Pt was seen for re eval after B BK amputations, and despite her pain is moving with better control than with BLE's before surgery.  Has most of her pain on LLE and with better pain control should be able to work on transfers to chair and control of sitting balance.  Pt is motivated to get home to her husband who also has been a recent surgery pt with an amputation, and will need his wife to be as independent as possible.  Follow up with CIR recommendation, and if this does not get approved, default to SNF care.  See for goals of PT below.    Follow Up Recommendations CIR    Equipment Recommendations  Rolling walker with 5" wheels;3in1 (PT);Wheelchair (measurements PT);Wheelchair cushion (measurements PT)    Recommendations for Other Services Rehab consult     Precautions / Restrictions Precautions Precautions: Fall Precaution Comments: NWB bil legs Restrictions Weight Bearing Restrictions: Yes Other Position/Activity Restrictions: NWB Bil lower extremities      Mobility  Bed Mobility Overal bed mobility: Needs Assistance Bed Mobility: Supine to Sit;Sit to Supine Rolling: Mod assist   Supine to sit: Mod assist Sit to supine: Mod  assist   General bed mobility comments: attempted sittin gon side of bed but pain on LLE was too severe    Transfers Overall transfer level: Needs assistance               General transfer comment: deferred  Ambulation/Gait                Stairs            Wheelchair Mobility    Modified Rankin (Stroke Patients Only)       Balance Overall balance assessment: Needs assistance Sitting-balance support: Bilateral upper extremity supported;Feet unsupported Sitting balance-Leahy Scale: Fair                                       Pertinent Vitals/Pain Pain Assessment: 0-10 Pain Score: 5  Pain Location: B surgery sites esp LLE Pain Descriptors / Indicators: Guarding;Operative site guarding Pain Intervention(s): Limited activity within patient's tolerance;Monitored during session;Premedicated before session;Repositioned    Home Living Family/patient expects to be discharged to:: Private residence Living Arrangements: Spouse/significant other Available Help at Discharge: Family;Available PRN/intermittently Type of Home: Apartment Home Access: Ramped entrance     Home Layout: One level Home Equipment: Cane - single point;Walker - 2 wheels;Wheelchair - manual;Shower seat      Prior Function Level of Independence: Independent with assistive device(s)         Comments: wasdriving and on SPC     Hand Dominance   Dominant Hand: Right  Extremity/Trunk Assessment   Upper Extremity Assessment Upper Extremity Assessment: Defer to OT evaluation    Lower Extremity Assessment Lower Extremity Assessment: Generalized weakness    Cervical / Trunk Assessment Cervical / Trunk Assessment: Kyphotic  Communication   Communication: No difficulties  Cognition Arousal/Alertness: Lethargic Behavior During Therapy: Flat affect Overall Cognitive Status: Within Functional Limits for tasks assessed                                  General Comments: able to talk with PT despite lethargy      General Comments General comments (skin integrity, edema, etc.): Pt was up to side of bed wiht help but could not get legs fully over side due to severe LLE pain    Exercises     Assessment/Plan    PT Assessment Patient needs continued PT services  PT Problem List Decreased strength;Decreased range of motion;Decreased activity tolerance;Decreased balance;Decreased mobility;Decreased coordination;Pain;Obesity       PT Treatment Interventions DME instruction;Functional mobility training;Therapeutic activities;Therapeutic exercise;Balance training;Neuromuscular re-education;Patient/family education    PT Goals (Current goals can be found in the Care Plan section)  Acute Rehab PT Goals Patient Stated Goal: to get home    Frequency Min 3X/week   Barriers to discharge Inaccessible home environment;Decreased caregiver support husband had amputation has no help    Co-evaluation               AM-PAC PT "6 Clicks" Mobility  Outcome Measure Help needed turning from your back to your side while in a flat bed without using bedrails?: A Little Help needed moving from lying on your back to sitting on the side of a flat bed without using bedrails?: A Lot Help needed moving to and from a bed to a chair (including a wheelchair)?: A Lot Help needed standing up from a chair using your arms (e.g., wheelchair or bedside chair)?: Total Help needed to walk in hospital room?: Total Help needed climbing 3-5 steps with a railing? : Total 6 Click Score: 10    End of Session   Activity Tolerance: Patient limited by pain;Treatment limited secondary to medical complications (Comment) Patient left: in bed;with call bell/phone within reach Nurse Communication: Mobility status PT Visit Diagnosis: Muscle weakness (generalized) (M62.81);Difficulty in walking, not elsewhere classified (R26.2);Pain Pain - Right/Left: Left Pain - part of  body: Leg    Time: 3300-7622 PT Time Calculation (min) (ACUTE ONLY): 31 min   Charges:   PT Evaluation $PT Eval Moderate Complexity: 1 Mod         Ramond Dial 09/14/2020, 8:22 PM Mee Hives, PT MS Acute Rehab Dept. Number: [place] and [place]

## 2020-09-14 NOTE — Progress Notes (Signed)
Went into pt's room to remove foley. Pt stated she would like to keep foley in. The foley was placed before her surgery for retention. Paged on call to notify them and she said to follow up with attending about pt keeping foley in place.

## 2020-09-14 NOTE — Plan of Care (Signed)

## 2020-09-14 NOTE — Evaluation (Addendum)
Occupational Therapy Re Evaluation Patient Details Name: Michelle Skinner MRN: [id number] DOB: 30-Aug-1965 Today's Date: 09/14/2020    History of Present Illness Pt is a 55 y.o. female who presented to the ED 09/06/20 with open wounds at bil feet. Per chart, 3 weeks PTA she sustained a "bone bruise" of her R lateral foot that progressed to increased redness and pain and later spread to her L foot. Pt found to be septic 2/2 cellulitis involving feet with lactic acidosis, leukocytosism, AKI, tachycardia, and tachypnea. S/p sharp debridement of wounds 09/08/20 and irrigation with debridement 09/10/20. BL BK amputations were done on 09/13/20, re-referred to PT.  PMH: morbid obesity, DM2 on insulin w/ peripheral neuropathy, HTN, GERD, and cervical cancer.   Clinical Impression   Pt presents with decline in function and safety with ADLs and ADL mobility with impaired strength, balance and endurance. Pt with B transtibial amputations 09/12/20. PTA, pt  independent with ADLs/selfcare, home mgt, with intermittent use of SPC, pt drives. Pt cares for her husband who just had a BKA. Pt currently requires mod  +2 for rolling, min guard A to sit EOB, max A - total A with LB ADLs and is NWB B LEs at this time. Pt would benefit from acute OT services to address impairments to maximize level of function and safety    Follow Up Recommendations  SNF    Equipment Recommendations  Wheelchair (measurements OT);Wheelchair cushion (measurements OT);3 in 1 bedside commode (bariatric BSC)    Recommendations for Other Services       Precautions / Restrictions Precautions Precautions: Fall Precaution Comments: NWB bil legs Restrictions Weight Bearing Restrictions: Yes Other Position/Activity Restrictions: NWB Bil lower extremities      Mobility Bed Mobility Overal bed mobility: Needs Assistance             General bed mobility comments: min assist to support legs on nonpainful locations    Transfers                  General transfer comment: deferred    Balance       Sitting balance - Comments: unable to sit EOB due to pain, was able to long sit in bed using rails       Standing balance comment: NWB bil legs, deferred                           ADL either performed or assessed with clinical judgement   ADL Overall ADL's : Needs assistance/impaired                           Toilet Transfer Details (indicate cue type and reason): NWB B LEs, pain, deferred Toileting- Clothing Manipulation and Hygiene: Total assistance;Bed level               Vision Baseline Vision/History: Wears glasses Patient Visual Report: No change from baseline       Perception     Praxis      Pertinent Vitals/Pain Pain Assessment: 0-10 Pain Score: 5  Pain Location: B surgery sites esp LLE Pain Descriptors / Indicators: Guarding;Grimacing Pain Intervention(s): Limited activity within patient's tolerance;Monitored during session;Premedicated before session;Repositioned     Hand Dominance     Extremity/Trunk Assessment             Communication     Cognition Arousal/Alertness: Lethargic Behavior During Therapy: Flat affect Overall Cognitive Status: Within Functional  Limits for tasks assessed                                 General Comments: pt is lethargic but demosntrating understanding of her work and instructions   General Comments       Exercises     Shoulder Instructions      Home Living                                          Prior Functioning/Environment                   OT Problem List:        OT Treatment/Interventions:      OT Goals(Current goals can be found in the care plan section) Acute Rehab OT Goals Patient Stated Goal: to get home OT Goal Formulation: With patient Time For Goal Achievement: 09/28/20 Potential to Achieve Goals: Good  OT Frequency: Min 2X/week   Barriers to D/C:             Co-evaluation              AM-PAC OT "6 Clicks" Daily Activity     Outcome Measure Help from another person eating meals?: None Help from another person taking care of personal grooming?: A Little Help from another person toileting, which includes using toliet, bedpan, or urinal?: Total Help from another person bathing (including washing, rinsing, drying)?: A Lot Help from another person to put on and taking off regular upper body clothing?: A Little Help from another person to put on and taking off regular lower body clothing?: Total 6 Click Score: 14   End of Session    Activity Tolerance: Patient limited by pain Patient left: in bed;with call bell/phone within reach  OT Visit Diagnosis: Other abnormalities of gait and mobility (R26.89);Muscle weakness (generalized) (M62.81);Pain Pain - Right/Left:  (bilaterally, more with l LE) Pain - part of body: Leg                Time: 3976-7341 OT Time Calculation (min): 23 min Charges:  OT Evaluation $OT Re-eval: 1 Re-eval    Britt Bottom 09/14/2020, 3:27 PM

## 2020-09-15 DIAGNOSIS — Z6841 Body Mass Index (BMI) 40.0 and over, adult: Secondary | ICD-10-CM | POA: Diagnosis not present

## 2020-09-15 DIAGNOSIS — R5381 Other malaise: Secondary | ICD-10-CM

## 2020-09-15 DIAGNOSIS — M86071 Acute hematogenous osteomyelitis, right ankle and foot: Secondary | ICD-10-CM | POA: Diagnosis not present

## 2020-09-15 DIAGNOSIS — L97513 Non-pressure chronic ulcer of other part of right foot with necrosis of muscle: Secondary | ICD-10-CM | POA: Diagnosis not present

## 2020-09-15 DIAGNOSIS — D62 Acute posthemorrhagic anemia: Secondary | ICD-10-CM

## 2020-09-15 DIAGNOSIS — A4101 Sepsis due to Methicillin susceptible Staphylococcus aureus: Secondary | ICD-10-CM | POA: Diagnosis not present

## 2020-09-15 LAB — MAGNESIUM: Magnesium: 1.9 mg/dL (ref 1.7–2.4)

## 2020-09-15 LAB — COMPREHENSIVE METABOLIC PANEL
ALT: 5 U/L (ref 0–44)
AST: 18 U/L (ref 15–41)
Albumin: <1 g/dL — ABNORMAL LOW (ref 3.5–5.0)
Alkaline Phosphatase: 74 U/L (ref 38–126)
Anion gap: 4 — ABNORMAL LOW (ref 5–15)
BUN: 35 mg/dL — ABNORMAL HIGH (ref 6–20)
CO2: 26 mmol/L (ref 22–32)
Calcium: 7.4 mg/dL — ABNORMAL LOW (ref 8.9–10.3)
Chloride: 106 mmol/L (ref 98–111)
Creatinine, Ser: 1.43 mg/dL — ABNORMAL HIGH (ref 0.44–1.00)
GFR, Estimated: 44 mL/min — ABNORMAL LOW (ref >60)
Glucose, Bld: 123 mg/dL — ABNORMAL HIGH (ref 70–99)
Potassium: 4.4 mmol/L (ref 3.5–5.1)
Sodium: 136 mmol/L (ref 135–145)
Total Bilirubin: 0.1 mg/dL — ABNORMAL LOW (ref 0.3–1.2)
Total Protein: 5.3 g/dL — ABNORMAL LOW (ref 6.5–8.1)

## 2020-09-15 LAB — CBC WITH DIFFERENTIAL/PLATELET
Abs Immature Granulocytes: 0.37 10*3/uL — ABNORMAL HIGH (ref 0.00–0.07)
Basophils Absolute: 0 10*3/uL (ref 0.0–0.1)
Basophils Relative: 0 %
Eosinophils Absolute: 0.1 10*3/uL (ref 0.0–0.5)
Eosinophils Relative: 1 %
HCT: 21.6 % — ABNORMAL LOW (ref 36.0–46.0)
Hemoglobin: 6.6 g/dL — CL (ref 12.0–15.0)
Immature Granulocytes: 3 %
Lymphocytes Relative: 19 %
Lymphs Abs: 2.2 10*3/uL (ref 0.7–4.0)
MCH: 28.1 pg (ref 26.0–34.0)
MCHC: 30.6 g/dL (ref 30.0–36.0)
MCV: 91.9 fL (ref 80.0–100.0)
Monocytes Absolute: 0.7 10*3/uL (ref 0.1–1.0)
Monocytes Relative: 6 %
Neutro Abs: 8.1 10*3/uL — ABNORMAL HIGH (ref 1.7–7.7)
Neutrophils Relative %: 71 %
Platelets: 348 10*3/uL (ref 150–400)
RBC: 2.35 MIL/uL — ABNORMAL LOW (ref 3.87–5.11)
RDW: 16 % — ABNORMAL HIGH (ref 11.5–15.5)
WBC: 11.4 10*3/uL — ABNORMAL HIGH (ref 4.0–10.5)
nRBC: 0.4 % — ABNORMAL HIGH (ref 0.0–0.2)

## 2020-09-15 LAB — GLUCOSE, CAPILLARY
Glucose-Capillary: 161 mg/dL — ABNORMAL HIGH (ref 70–99)
Glucose-Capillary: 133 mg/dL — ABNORMAL HIGH (ref 70–99)
Glucose-Capillary: 73 mg/dL (ref 70–99)
Glucose-Capillary: 117 mg/dL — ABNORMAL HIGH (ref 70–99)
Glucose-Capillary: 146 mg/dL — ABNORMAL HIGH (ref 70–99)

## 2020-09-15 LAB — HEMOGLOBIN AND HEMATOCRIT, BLOOD
HCT: 31 % — ABNORMAL LOW (ref 36.0–46.0)
Hemoglobin: 9.9 g/dL — ABNORMAL LOW (ref 12.0–15.0)

## 2020-09-15 LAB — PREPARE RBC (CROSSMATCH)

## 2020-09-15 MED ORDER — POLYETHYLENE GLYCOL 3350 17 G PO PACK
17.0000 g | PACK | Freq: Two times a day (BID) | ORAL | Status: DC | PRN
  Administered 2020-09-16: 17 g via ORAL
  Filled 2020-09-15: qty 1

## 2020-09-15 MED ORDER — SODIUM CHLORIDE 0.9% IV SOLUTION: Freq: Once | INTRAVENOUS | Status: AC

## 2020-09-15 MED ORDER — JUVEN PO PACK
1.0000 | PACK | Freq: Two times a day (BID) | ORAL | Status: DC
  Administered 2020-09-16 – 2020-09-17 (×3): 1 via ORAL
  Filled 2020-09-15 (×3): qty 1

## 2020-09-15 MED ORDER — ADULT MULTIVITAMIN W/MINERALS CH
1.0000 | ORAL_TABLET | Freq: Every day | ORAL | Status: DC
  Administered 2020-09-15 – 2020-09-17 (×3): 1 via ORAL
  Filled 2020-09-15 (×3): qty 1

## 2020-09-15 MED ORDER — SENNOSIDES-DOCUSATE SODIUM 8.6-50 MG PO TABS
1.0000 | ORAL_TABLET | Freq: Two times a day (BID) | ORAL | Status: DC | PRN
  Administered 2020-09-16: 1 via ORAL
  Filled 2020-09-15: qty 1

## 2020-09-15 MED ORDER — METOPROLOL SUCCINATE ER 50 MG PO TB24
50.0000 mg | ORAL_TABLET | Freq: Every day | ORAL | Status: DC
  Administered 2020-09-15 – 2020-09-17 (×2): 50 mg via ORAL
  Filled 2020-09-15 (×2): qty 1

## 2020-09-15 MED ORDER — ENSURE MAX PROTEIN PO LIQD
11.0000 [oz_av] | Freq: Every day | ORAL | Status: DC
  Administered 2020-09-15 – 2020-09-17 (×2): 11 [oz_av] via ORAL

## 2020-09-15 MED ORDER — SODIUM CHLORIDE 0.9% IV SOLUTION: Freq: Once | INTRAVENOUS | Status: DC

## 2020-09-15 MED ORDER — MAGNESIUM CITRATE PO SOLN
1.0000 | Freq: Every day | ORAL | Status: DC | PRN
  Administered 2020-09-15 – 2020-09-16 (×2): 1 via ORAL
  Filled 2020-09-15 (×2): qty 296

## 2020-09-15 NOTE — TOC Progression Note (Addendum)
Transition of Care [facility]) - Progression Note    Patient Details  Name: Michelle Skinner MRN: [id number] Date of Birth: 05/15/1966  Transition of Care [facility]) CM/SW Contact  Sharin Mons, RN Phone Number: 09/15/2020, 11:09 AM  Clinical Narrative:    Pt accepted SNF bed offer from [facility]. Insurance authorization initiated by [employer].  TOC team will continue to monitor and assist with needs....  09/14/2020 @ 3:53PM  [facility]  Ref.# [id number] x 5 days , start date 3/11-3/15,  Lanier Ensign CM , fax # [phone].  Expected Discharge Plan: Skilled Nursing Facility Barriers to Discharge: Continued Medical Work up,Insurance Authorization  Expected Discharge Plan and Services Expected Discharge Plan: [place] arrangements for the past 2 months: Apartment                                       Social Determinants of Health (SDOH) Interventions    Readmission Risk Interventions No flowsheet data found.

## 2020-09-15 NOTE — Plan of Care (Signed)
  Problem: Health Behavior/Discharge Planning: Goal: Ability to manage health-related needs will improve Outcome: Not Progressing   Problem: Activity: Goal: Risk for activity intolerance will decrease Outcome: Progressing   Problem: Skin Integrity: Goal: Risk for impaired skin integrity will decrease Outcome: Not Progressing

## 2020-09-15 NOTE — Progress Notes (Signed)
PROGRESS NOTE  Michelle Skinner:[id number] DOB: [birth year]   PCP: Monico Blitz, MD  Patient is from: Home  DOA: 09/06/2020 LOS: 9  Chief complaints: Pain in feet and generalized weakness  Brief Narrative / Interim history: 55 year old F with PMH of IDDM-2 with peripheral neuropathy, morbid obesity, HTN, hypothyroidism, CKD-3A, GERD and depression presented to [facility] with bilateral feet pain, progressive weakness.  She had initial debridement bilateral feet ulcer by Dr. Constance Haw at [facility], and subsequently transferred to [facility] for further surgical care.  Tissue culture with staph aureus, Enterococcus faecalis and Enterobacter closae  She underwent I&D of bilateral feet by Dr. Sharol Given on 09/10/2020 and noted to have circumferential abscess with osteomyelitis and cellulitis.  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  She also have MSSA bacteremia, and ABLA requiring blood transfusions.  See individual problem list below for more on hospital course.  Subjective: Seen and examined earlier this morning.  No major events overnight of this morning.  Continues to endorse 9/10 pain in both legs.  She also says she has not a bowel movement.  Denies chest pain or dyspnea.  She had drop in hemoglobin overnight.  A unit of pRBC ordered and transfusing.  Objective: Vitals:   09/15/20 1000 09/15/20 1018 09/15/20 1034 09/15/20 1252  BP: (!) 127/49 (!) 127/49 (!) 117/47 (!) 126/49  Pulse: (!) 59 (!) 59 (!) 59 61  Resp: 17 17 17 16   Temp: 98.2 F (36.8 C) 98.2 F (36.8 C) 98.1 F (36.7 C) 98 F (36.7 C)  TempSrc: Oral Oral Oral Oral  SpO2: 100% 99% 98% 98%  Weight:      Height:        Intake/Output Summary (Last 24 hours) at 09/15/2020 1426 Last data filed at 09/15/2020 1252 Gross per 24 hour  Intake 2554.5 ml  Output 1010 ml  Net 1544.5 ml   Filed Weights   09/06/20 1249 09/06/20 2200 09/13/20 1141  Weight: (!) 158.8 kg (!) 148.1 kg (!) 148.1 kg     Examination:  GENERAL: No apparent distress.  Nontoxic. HEENT: MMM.  Vision and hearing grossly intact.  NECK: Supple.  No apparent JVD.  RESP:  No IWOB.  Fair aeration bilaterally. CVS:  RRR. Heart sounds normal.  ABD/GI/GU: BS+. Abd soft, NTND.  MSK/EXT:  Moves extremities.  Bilateral BKA.  Wound VAC in place.  No significant drainage in canister SKIN: no apparent skin lesion or wound NEURO: Awake, alert and oriented appropriately.  No apparent focal neuro deficit. PSYCH: Calm. Normal affect.   Procedures:  3/4-incision and drainage of bilateral feet 3/7-incision and drainage of bilateral feet 3/9-bilateral BKA with wound VAC placement  Microbiology summarized: 3/2-COVID-19 and influenza PCR nonreactive. 3/2-blood culture with staph aureus 3/2-urine culture with 1000 colonies of Proteus mirabilis and 3000 colonies of Klebsiella pneumonia 3/4-tissue culture with Staph aureus and Enterococcus faecalis 3/6-surgical tissue culture with staph aureus, stenotrophomonas maltophilia and Enterobacter cloacae 3/7-blood cultures NGTD  Assessment & Plan: Sepsis due to MSSA bacteremia/BLE osteomyelitis/cellulitis in patient with chronic lymphedema/wound -S/p bilateral BKA with wound VAC placement on 09/13/2020 -TTE on 3/6 - for vegetation.  ID did not feel TEE is needed -ID recommended 4 weeks of IV antibiotics (IV Ancef) from date of negative blood cultures (3/7) -Pain control with bowel regimen -Continue PT/OT  AKI on CKD-3A: Relatively stable. Recent Labs    04/28/20 0226 04/30/20 0150 05/04/20 0135 07/03/20 1001 09/06/20 1401 09/07/20 0622 09/07/20 1126 09/11/20 0428 09/12/20 0239 09/13/20 8119  09/14/20 0347 09/15/20 0325  BUN 27*  --   --  13 52* 52* 51* 36* 30* 26* 31* 35*  CREATININE 1.33*   < > 1.37* 1.07* 1.94* 1.81* 1.79* 1.47* 1.41* 1.31* 1.40* 1.43*   < > = values in this interval not displayed.  -Avoid nephrotoxic meds -Continue monitoring  Acute postop  blood loss anemia superimposed on anemia of renal disease: Received 4 units of blood so far. Recent Labs    04/28/20 0226 04/29/20 0807 09/06/20 1401 09/07/20 0622 09/11/20 0428 09/12/20 0239 09/13/20 0514 09/13/20 1854 09/14/20 0347 09/15/20 0325  HGB 10.2* 10.0* 9.8* 8.8* 7.5* 7.6* 7.0* 9.6* 7.7* 6.6*  -Check Hemoccult -Transfuse for Hgb less than 7.0 -Check anemia panel-although this would be after 4 units of blood now  Acute urinary retention with bladder over distention: She had about a liter of UOP after Foley irrigation this morning -Foley was present on admission per patient but no documentation. -Continue indwelling Foley at least for a week for bladder decompression  Uncontrolled IDDM-2 with hyperglycemia, hyperlipidemia, neuropathy and CKD-3A: On high-dose Tresiba and short acting insulin at home. Recent Labs  Lab 09/14/20 1256 09/14/20 1657 09/14/20 2124 09/15/20 0637 09/15/20 1125  GLUCAP 291* 150* 80 161* 133*  -Check hemoglobin A1c -Continue Lantus 28 units twice daily, NovoLog AC 15 units and SSI-resistant -Continue statin  Essential hypertension: DBP in 40's, and HR in 50's -Decrease metoprolol to 50 mg daily  Hypothyroidism -Continue home Synthroid  Hyperlipidemia -Continue statin  Anxiety and depression -Continue home meds  GERD -Continue Protonix  Debility/physical deconditioning -PT/OT  Constipation -As needed bowel regimens based on severity  Morbid obesity Body mass index is 50.35 kg/m.  -Could benefit from GLP-1 inhibitors in the setting of diabetes       DVT prophylaxis:  SCDs Start: 09/10/20 0932  On subcu Loveno  Code Status: Full code Family Communication: Patient and/or RN. Available if any question.  Level of care: Telemetry Medical Status is: Inpatient  Remains inpatient appropriate because:Unsafe d/c plan, IV treatments appropriate due to intensity of illness or inability to take PO and Inpatient level of care  appropriate due to severity of illness   Dispo: The patient is from: Home              Anticipated d/c is to: SNF              Patient currently is not medically stable to d/c.   Difficult to place patient No       Consultants:  General surgery Orthopedic surgery Infectious disease   Sch Meds:  Scheduled Meds:  sodium chloride   Intravenous Once   sodium chloride   Intravenous Once   sodium chloride   Intravenous Once   atorvastatin  40 mg Oral QHS   Chlorhexidine Gluconate Cloth  6 each Topical Daily   Chlorhexidine Gluconate Cloth  6 each Topical Q0600   cholecalciferol  5,000 Units Oral Daily   docusate sodium  100 mg Oral BID   enoxaparin (LOVENOX) injection  70 mg Subcutaneous Q24H   FLUoxetine  40 mg Oral Daily   insulin aspart  0-20 Units Subcutaneous TID WC   insulin aspart  15 Units Subcutaneous TID WC   insulin glargine  28 Units Subcutaneous BID   levothyroxine  150 mcg Oral QAC breakfast   magnesium oxide  200 mg Oral Daily   metoprolol succinate  50 mg Oral QHS   mupirocin ointment  1 application Nasal BID  pantoprazole  40 mg Oral Daily   senna  1 tablet Oral BID   sodium chloride flush  10-40 mL Intracatheter Q12H   Continuous Infusions:  sodium chloride Stopped (09/13/20 1637)   sodium chloride      ceFAZolin (ANCEF) IV 2 g (09/15/20 0549)   methocarbamol (ROBAXIN) IV     PRN Meds:.acetaminophen, bisacodyl, dextrose, HYDROmorphone (DILAUDID) injection, magnesium citrate, methocarbamol **OR** methocarbamol (ROBAXIN) IV, metoCLOPramide **OR** metoCLOPramide (REGLAN) injection, ondansetron **OR** ondansetron (ZOFRAN) IV, oxyCODONE, oxyCODONE, polyethylene glycol, sodium chloride flush, traZODone  Antimicrobials: Anti-infectives (From admission, onward)   Start     Dose/Rate Route Frequency Ordered Stop   09/13/20 2200  ceFAZolin (ANCEF) IVPB 2g/100 mL premix        2 g 200 mL/hr over 30 Minutes Intravenous Every 8 hours  09/13/20 1338     09/13/20 0600  ceFAZolin (ANCEF) 3 g in dextrose 5 % 50 mL IVPB  Status:  Discontinued        3 g 100 mL/hr over 30 Minutes Intravenous On call to O.R. 09/12/20 1252 09/13/20 1818   09/10/20 1600  Ampicillin-Sulbactam (UNASYN) 3 g in sodium chloride 0.9 % 100 mL IVPB  Status:  Discontinued        3 g 200 mL/hr over 30 Minutes Intravenous Every 6 hours 09/10/20 1437 09/13/20 1338   09/10/20 0730  ceFAZolin (ANCEF) 3 g in dextrose 5 % 50 mL IVPB  Status:  Discontinued        3 g 100 mL/hr over 30 Minutes Intravenous On call to O.R. 09/10/20 5643 09/10/20 0928   09/10/20 0646  ceFAZolin (ANCEF) 2-4 GM/100ML-% IVPB  Status:  Discontinued       Note to Pharmacy: Ladoris Gene   : cabinet override      09/10/20 0646 09/10/20 0709   09/10/20 0646  ceFAZolin (ANCEF) 1-4 GM/50ML-% IVPB       Note to Pharmacy: Ladoris Gene   : cabinet override      09/10/20 0646 09/10/20 0812   09/08/20 1400  ceFAZolin (ANCEF) IVPB 2g/100 mL premix  Status:  Discontinued        2 g 200 mL/hr over 30 Minutes Intravenous Every 8 hours 09/08/20 0947 09/10/20 1432   09/07/20 1700  vancomycin (VANCOREADY) IVPB 1750 mg/350 mL  Status:  Discontinued        1,750 mg 175 mL/hr over 120 Minutes Intravenous Every 24 hours 09/06/20 1606 09/08/20 0947   09/07/20 1600  vancomycin (VANCOREADY) IVPB 1750 mg/350 mL  Status:  Discontinued        1,750 mg 175 mL/hr over 120 Minutes Intravenous Every 24 hours 09/06/20 1605 09/06/20 1606   09/06/20 1615  vancomycin (VANCOREADY) IVPB 2000 mg/400 mL        2,000 mg 200 mL/hr over 120 Minutes Intravenous  Once 09/06/20 1605 09/06/20 1856   09/06/20 1415  cefTRIAXone (ROCEPHIN) 2 g in sodium chloride 0.9 % 100 mL IVPB  Status:  Discontinued        2 g 200 mL/hr over 30 Minutes Intravenous Every 24 hours 09/06/20 1404 09/08/20 0947       I have personally reviewed the following labs and images: CBC: Recent Labs  Lab 09/11/20 0428 09/12/20 0239 09/13/20 0514  09/13/20 1854 09/14/20 0347 09/15/20 0325  WBC 15.9* 12.5* 10.6*  --  12.8* 11.4*  NEUTROABS 12.9* 9.7* 8.0*  --  11.1* 8.1*  HGB 7.5* 7.6* 7.0* 9.6* 7.7* 6.6*  HCT 23.7* 23.7* 22.2* 28.7* 25.0*  21.6*  MCV 87.5 88.4 89.5  --  91.6 91.9  PLT 289 296 301  --  334 348   BMP &GFR Recent Labs  Lab 09/11/20 0428 09/12/20 0239 09/13/20 0514 09/14/20 0347 09/15/20 0325  NA 132* 136 134* 134* 136  K 3.5 3.6 4.1 5.7* 4.4  CL 103 106 104 103 106  CO2 18* 22 23 26 26   GLUCOSE 129* 186* 212* 437* 123*  BUN 36* 30* 26* 31* 35*  CREATININE 1.47* 1.41* 1.31* 1.40* 1.43*  CALCIUM 7.5* 7.5* 7.5* 7.4* 7.4*  MG 2.0 2.0 1.9 1.9 1.9   Estimated Creatinine Clearance: 69.6 mL/min (A) (by C-G formula based on SCr of 1.43 mg/dL (H)). Liver & Pancreas: Recent Labs  Lab 09/11/20 0428 09/12/20 0239 09/13/20 0514 09/14/20 0347 09/15/20 0325  AST 12* 12* 12* 19 18  ALT 5 6 6 6 5   ALKPHOS 99 95 106 76 74  BILITOT 0.7 0.6 0.4 0.1* 0.1*  PROT 5.8* 5.9* 6.0* 5.6* 5.3*  ALBUMIN <1.0* <1.0* <1.0* <1.0* <1.0*   No results for input(s): LIPASE, AMYLASE in the last 168 hours. No results for input(s): AMMONIA in the last 168 hours. Diabetic: No results for input(s): HGBA1C in the last 72 hours. Recent Labs  Lab 09/14/20 1256 09/14/20 1657 09/14/20 2124 09/15/20 0637 09/15/20 1125  GLUCAP 291* 150* 80 161* 133*   Cardiac Enzymes: No results for input(s): CKTOTAL, CKMB, CKMBINDEX, TROPONINI in the last 168 hours. No results for input(s): PROBNP in the last 8760 hours. Coagulation Profile: No results for input(s): INR, PROTIME in the last 168 hours. Thyroid Function Tests: No results for input(s): TSH, T4TOTAL, FREET4, T3FREE, THYROIDAB in the last 72 hours. Lipid Profile: No results for input(s): CHOL, HDL, LDLCALC, TRIG, CHOLHDL, LDLDIRECT in the last 72 hours. Anemia Panel: No results for input(s): VITAMINB12, FOLATE, FERRITIN, TIBC, IRON, RETICCTPCT in the last 72 hours. Urine  analysis:    Component Value Date/Time   COLORURINE YELLOW 09/06/2020 1401   APPEARANCEUR CLEAR 09/06/2020 1401   LABSPEC 1.017 09/06/2020 1401   PHURINE 5.0 09/06/2020 1401   GLUCOSEU >=500 (A) 09/06/2020 1401   HGBUR SMALL (A) 09/06/2020 1401   BILIRUBINUR NEGATIVE 09/06/2020 1401   KETONESUR 20 (A) 09/06/2020 1401   PROTEINUR 100 (A) 09/06/2020 1401   UROBILINOGEN 0.2 08/05/2008 1400   NITRITE NEGATIVE 09/06/2020 1401   LEUKOCYTESUR NEGATIVE 09/06/2020 1401   Sepsis Labs: Invalid input(s): PROCALCITONIN, [place]  Microbiology: Recent Results (from the past 240 hour(s))  Resp Panel by RT-PCR (Flu A&B, Covid) Nasopharyngeal Swab     Status: None   Collection Time: 09/06/20  2:01 PM   Specimen: Nasopharyngeal Swab; Nasopharyngeal(NP) swabs in vial transport medium  Result Value Ref Range Status   SARS Coronavirus 2 by RT PCR NEGATIVE NEGATIVE Final    Comment: (NOTE) SARS-CoV-2 target nucleic acids are NOT DETECTED.  The SARS-CoV-2 RNA is generally detectable in upper respiratory specimens during the acute phase of infection. The lowest concentration of SARS-CoV-2 viral copies this assay can detect is 138 copies/mL. A negative result does not preclude SARS-Cov-2 infection and should not be used as the sole basis for treatment or other patient management decisions. A negative result may occur with  improper specimen collection/handling, submission of specimen other than nasopharyngeal swab, presence of viral mutation(s) within the areas targeted by this assay, and inadequate number of viral copies(<138 copies/mL). A negative result must be combined with clinical observations, patient history, and epidemiological information. The expected result is Negative.  Fact Sheet for Patients:  EntrepreneurPulse.com.au  Fact Sheet for Healthcare Providers:  IncredibleEmployment.be  This test is no t yet approved or cleared by the [place] FDA and  has been authorized for detection and/or diagnosis of SARS-CoV-2 by FDA under an Emergency Use Authorization (EUA). This EUA will remain  in effect (meaning this test can be used) for the duration of the COVID-19 declaration under Section 564(b)(1) of the Act, 21 U.S.C.section 360bbb-3(b)(1), unless the authorization is terminated  or revoked sooner.       Influenza A by PCR NEGATIVE NEGATIVE Final   Influenza B by PCR NEGATIVE NEGATIVE Final    Comment: (NOTE) The Xpert Xpress SARS-CoV-2/FLU/RSV plus assay is intended as an aid in the diagnosis of influenza from Nasopharyngeal swab specimens and should not be used as a sole basis for treatment. Nasal washings and aspirates are unacceptable for Xpert Xpress SARS-CoV-2/FLU/RSV testing.  Fact Sheet for Patients: EntrepreneurPulse.com.au  Fact Sheet for Healthcare Providers: IncredibleEmployment.be  This test is not yet approved or cleared by the [place] FDA and has been authorized for detection and/or diagnosis of SARS-CoV-2 by FDA under an Emergency Use Authorization (EUA). This EUA will remain in effect (meaning this test can be used) for the duration of the COVID-19 declaration under Section 564(b)(1) of the Act, 21 U.S.C. section 360bbb-3(b)(1), unless the authorization is terminated or revoked.  Performed at [facility], [address], [place], [place] [address]   Urine culture     Status: Abnormal   Collection Time: 09/06/20  2:01 PM   Specimen: In/Out Cath Urine  Result Value Ref Range Status   Specimen Description   Final    IN/OUT CATH URINE Performed at [facility], [address], [place], [place] [address]    Special Requests   Final    NONE Performed at [facility], [address] [place],  [address]    Culture (A)  Final    1,000 COLONIES/mL PROTEUS MIRABILIS 3,000 COLONIES/mL KLEBSIELLA PNEUMONIAE    Report Status 09/09/2020 FINAL  Final    Organism ID, Bacteria PROTEUS MIRABILIS (A)  Final   Organism ID, Bacteria KLEBSIELLA PNEUMONIAE (A)  Final      Susceptibility   Klebsiella pneumoniae - MIC*    AMPICILLIN RESISTANT Resistant     CEFAZOLIN <=4 SENSITIVE Sensitive     CEFEPIME <=0.12 SENSITIVE Sensitive     CEFTRIAXONE <=0.25 SENSITIVE Sensitive     CIPROFLOXACIN <=0.25 SENSITIVE Sensitive     GENTAMICIN <=1 SENSITIVE Sensitive     IMIPENEM <=0.25 SENSITIVE Sensitive     NITROFURANTOIN 32 SENSITIVE Sensitive     TRIMETH/SULFA <=20 SENSITIVE Sensitive     AMPICILLIN/SULBACTAM <=2 SENSITIVE Sensitive     PIP/TAZO <=4 SENSITIVE Sensitive     * 3,000 COLONIES/mL KLEBSIELLA PNEUMONIAE   Proteus mirabilis - MIC*    AMPICILLIN <=2 SENSITIVE Sensitive     CEFAZOLIN 8 SENSITIVE Sensitive     CEFEPIME <=0.12 SENSITIVE Sensitive     CEFTRIAXONE <=0.25 SENSITIVE Sensitive     CIPROFLOXACIN >=4 RESISTANT Resistant     GENTAMICIN <=1 SENSITIVE Sensitive     IMIPENEM 2 SENSITIVE Sensitive     NITROFURANTOIN 128 RESISTANT Resistant     TRIMETH/SULFA >=320 RESISTANT Resistant     AMPICILLIN/SULBACTAM <=2 SENSITIVE Sensitive     PIP/TAZO <=4 SENSITIVE Sensitive     * 1,000 COLONIES/mL PROTEUS MIRABILIS  Blood Culture (routine x 2)     Status: Abnormal   Collection Time: 09/06/20  2:14 PM   Specimen: BLOOD  Result Value Ref Range Status   Specimen Description   Final    BLOOD LEFT ANTECUBITAL Performed at [facility], [address] [place], [place] [address]    Special Requests   Final    BOTTLES DRAWN AEROBIC AND ANAEROBIC Blood Culture adequate volume Performed at [facility], [address] [place], [place] [address]    Culture  Setup Time   Final    GRAM POSITIVE COCCI IN BOTH AEROBIC AND ANAEROBIC BOTTLES Gram Stain Report Called to,Read Back By and Verified With: LARIMORE,[email or web address]  BY MATTHEWS, B 3.3.2022 Performed at [place] ID to follow CRITICAL RESULT CALLED TO, READ BACK BY AND VERIFIED  WITHMartin Majestic RN [id number] 09/07/20 A BROWNING Performed at [facility], [place] [address], [place],  [address]    Culture STAPHYLOCOCCUS AUREUS (A)  Final   Report Status 09/09/2020 FINAL  Final   Organism ID, Bacteria STAPHYLOCOCCUS AUREUS  Final      Susceptibility   Staphylococcus aureus - MIC*    CIPROFLOXACIN <=0.5 SENSITIVE Sensitive     ERYTHROMYCIN <=0.25 SENSITIVE Sensitive     GENTAMICIN <=0.5 SENSITIVE Sensitive     OXACILLIN <=0.25 SENSITIVE Sensitive     TETRACYCLINE <=1 SENSITIVE Sensitive     VANCOMYCIN <=0.5 SENSITIVE Sensitive     TRIMETH/SULFA <=10 SENSITIVE Sensitive     CLINDAMYCIN <=0.25 SENSITIVE Sensitive     RIFAMPIN <=0.5 SENSITIVE Sensitive     Inducible Clindamycin NEGATIVE Sensitive     * STAPHYLOCOCCUS AUREUS  Blood Culture ID Panel (Reflexed)     Status: Abnormal   Collection Time: 09/06/20  2:14 PM  Result Value Ref Range Status   Enterococcus faecalis NOT DETECTED NOT DETECTED Final   Enterococcus Faecium NOT DETECTED NOT DETECTED Final   Listeria monocytogenes NOT DETECTED NOT DETECTED Final   Staphylococcus species DETECTED (A) NOT DETECTED Final    Comment: CRITICAL RESULT CALLED TO, READ BACK BY AND VERIFIED WITH: Martin Majestic RN [id number] 09/07/20 A BROWNING    Staphylococcus aureus (BCID) DETECTED (A) NOT DETECTED Final    Comment: CRITICAL RESULT CALLED TO, READ BACK BY AND VERIFIED WITH: Martin Majestic RN [phone] 09/07/20 A BROWNING    Staphylococcus epidermidis NOT DETECTED NOT DETECTED Final   Staphylococcus lugdunensis NOT DETECTED NOT DETECTED Final   Streptococcus species NOT DETECTED NOT DETECTED Final   Streptococcus agalactiae NOT DETECTED NOT DETECTED Final   Streptococcus pneumoniae NOT DETECTED NOT DETECTED Final   Streptococcus pyogenes NOT DETECTED NOT DETECTED Final   A.calcoaceticus-baumannii NOT DETECTED NOT DETECTED Final   Bacteroides fragilis NOT DETECTED NOT DETECTED Final   Enterobacterales NOT DETECTED NOT DETECTED Final    Enterobacter cloacae complex NOT DETECTED NOT DETECTED Final   Escherichia coli NOT DETECTED NOT DETECTED Final   Klebsiella aerogenes NOT DETECTED NOT DETECTED Final   Klebsiella oxytoca NOT DETECTED NOT DETECTED Final   Klebsiella pneumoniae NOT DETECTED NOT DETECTED Final   Proteus species NOT DETECTED NOT DETECTED Final   Salmonella species NOT DETECTED NOT DETECTED Final   Serratia marcescens NOT DETECTED NOT DETECTED Final   Haemophilus influenzae NOT DETECTED NOT DETECTED Final   Neisseria meningitidis NOT DETECTED NOT DETECTED Final   Pseudomonas aeruginosa NOT DETECTED NOT DETECTED Final   Stenotrophomonas maltophilia NOT DETECTED NOT DETECTED Final   Candida albicans NOT DETECTED NOT DETECTED Final   Candida auris NOT DETECTED NOT DETECTED Final   Candida glabrata NOT DETECTED NOT DETECTED Final  Candida krusei NOT DETECTED NOT DETECTED Final   Candida parapsilosis NOT DETECTED NOT DETECTED Final   Candida tropicalis NOT DETECTED NOT DETECTED Final   Cryptococcus neoformans/gattii NOT DETECTED NOT DETECTED Final   Meth resistant mecA/C and MREJ NOT DETECTED NOT DETECTED Final    Comment: Performed at [facility], [place] [address] [place], [place] [address]  Blood Culture (routine x 2)     Status: None   Collection Time: 09/06/20  3:59 PM   Specimen: BLOOD RIGHT HAND  Result Value Ref Range Status   Specimen Description BLOOD RIGHT HAND  Final   Special Requests   Final    BOTTLES DRAWN AEROBIC AND ANAEROBIC Blood Culture adequate volume   Culture   Final    NO GROWTH 5 DAYS Performed at [facility], [address] [place], [place] [address]    Report Status 09/11/2020 FINAL  Final  MRSA PCR Screening     Status: None   Collection Time: 09/06/20  9:46 PM   Specimen: Nasal Mucosa; Nasopharyngeal  Result Value Ref Range Status   MRSA by PCR NEGATIVE NEGATIVE Final    Comment:        The GeneXpert MRSA Assay (FDA approved for NASAL specimens only), is one  component of a comprehensive MRSA colonization surveillance program. It is not intended to diagnose MRSA infection nor to guide or monitor treatment for MRSA infections. Performed at [facility], [address] [place], [place] [address]   Aerobic/Anaerobic Culture w Gram Stain (surgical/deep wound)     Status: None   Collection Time: 09/08/20 11:04 AM   Specimen: PATH Other; Tissue  Result Value Ref Range Status   Specimen Description   Final    FOOT RIGHT Performed at [facility], [address] [place], [place] [address]    Special Requests   Final    NONE Performed at [facility], [address] [place], [place] [address]    Gram Stain   Final    RARE WBC PRESENT, PREDOMINANTLY PMN MODERATE GRAM POSITIVE COCCI IN PAIRS IN CLUSTERS    Culture   Final    ABUNDANT STAPHYLOCOCCUS AUREUS MODERATE ENTEROCOCCUS FAECALIS NO ANAEROBES ISOLATED Performed at [facility], [place] [address] [place], [place] [address]    Report Status 09/14/2020 FINAL  Final   Organism ID, Bacteria STAPHYLOCOCCUS AUREUS  Final   Organism ID, Bacteria ENTEROCOCCUS FAECALIS  Final      Susceptibility   Enterococcus faecalis - MIC*    AMPICILLIN <=2 SENSITIVE Sensitive     VANCOMYCIN 1 SENSITIVE Sensitive     GENTAMICIN SYNERGY SENSITIVE Sensitive     * MODERATE ENTEROCOCCUS FAECALIS   Staphylococcus aureus - MIC*    CIPROFLOXACIN <=0.5 SENSITIVE Sensitive     ERYTHROMYCIN <=0.25 SENSITIVE Sensitive     GENTAMICIN <=0.5 SENSITIVE Sensitive     OXACILLIN 0.5 SENSITIVE Sensitive     TETRACYCLINE <=1 SENSITIVE Sensitive     VANCOMYCIN <=0.5 SENSITIVE Sensitive     TRIMETH/SULFA <=10 SENSITIVE Sensitive     CLINDAMYCIN <=0.25 SENSITIVE Sensitive     RIFAMPIN <=0.5 SENSITIVE Sensitive     Inducible Clindamycin NEGATIVE Sensitive     * ABUNDANT STAPHYLOCOCCUS AUREUS  Aerobic/Anaerobic Culture w Gram Stain (surgical/deep wound)     Status: None   Collection Time: 09/08/20 11:45 AM    Specimen: PATH Other; Tissue  Result Value Ref Range Status   Specimen Description   Final    FOOT RIGHT Performed  at [facility], [address] [place], [place] [address]    Special Requests   Final    NONE Performed at [facility], [address] [place], [place] [address]    Gram Stain   Final    FEW WBC PRESENT, PREDOMINANTLY PMN MODERATE GRAM POSITIVE COCCI IN PAIRS IN CLUSTERS RARE GRAM NEGATIVE RODS    Culture   Final    MODERATE STAPHYLOCOCCUS AUREUS RARE ENTEROCOCCUS FAECALIS RARE ENTEROBACTER CLOACAE NO ANAEROBES ISOLATED Performed at [facility], [place] [address] [place], [place] [address]    Report Status 09/14/2020 FINAL  Final   Organism ID, Bacteria STAPHYLOCOCCUS AUREUS  Final   Organism ID, Bacteria ENTEROCOCCUS FAECALIS  Final   Organism ID, Bacteria ENTEROBACTER CLOACAE  Final      Susceptibility   Enterobacter cloacae - MIC*    CEFAZOLIN >=64 RESISTANT Resistant     CEFEPIME <=0.12 SENSITIVE Sensitive     CEFTAZIDIME <=1 SENSITIVE Sensitive     CIPROFLOXACIN 1 SENSITIVE Sensitive     GENTAMICIN <=1 SENSITIVE Sensitive     IMIPENEM 0.5 SENSITIVE Sensitive     TRIMETH/SULFA <=20 SENSITIVE Sensitive     PIP/TAZO <=4 SENSITIVE Sensitive     * RARE ENTEROBACTER CLOACAE   Enterococcus faecalis - MIC*    AMPICILLIN <=2 SENSITIVE Sensitive     VANCOMYCIN 1 SENSITIVE Sensitive     GENTAMICIN SYNERGY SENSITIVE Sensitive     * RARE ENTEROCOCCUS FAECALIS   Staphylococcus aureus - MIC*    CIPROFLOXACIN <=0.5 SENSITIVE Sensitive     ERYTHROMYCIN <=0.25 SENSITIVE Sensitive     GENTAMICIN <=0.5 SENSITIVE Sensitive     OXACILLIN <=0.25 SENSITIVE Sensitive     TETRACYCLINE <=1 SENSITIVE Sensitive     VANCOMYCIN <=0.5 SENSITIVE Sensitive     TRIMETH/SULFA <=10 SENSITIVE Sensitive     CLINDAMYCIN <=0.25 SENSITIVE Sensitive     RIFAMPIN <=0.5 SENSITIVE Sensitive     Inducible Clindamycin NEGATIVE Sensitive     * MODERATE STAPHYLOCOCCUS AUREUS   Aerobic/Anaerobic Culture w Gram Stain (surgical/deep wound)     Status: None   Collection Time: 09/08/20 11:46 AM   Specimen: PATH Other; Tissue  Result Value Ref Range Status   Specimen Description   Final    FOOT LEFT Performed at [facility], [address] [place], [place] [address]    Special Requests   Final    NONE Performed at [facility], [address] [place], [place] [address]    Gram Stain   Final    RARE WBC PRESENT, PREDOMINANTLY PMN ABUNDANT GRAM POSITIVE COCCI IN CLUSTERS    Culture   Final    ABUNDANT ENTEROCOCCUS FAECALIS MODERATE STAPHYLOCOCCUS AUREUS WITHIN MIXED ORGANISMS NO ANAEROBES ISOLATED Performed at [facility], [address], [place], [place] [address]    Report Status 09/14/2020 FINAL  Final   Organism ID, Bacteria STAPHYLOCOCCUS AUREUS  Final   Organism ID, Bacteria ENTEROCOCCUS FAECALIS  Final      Susceptibility   Enterococcus faecalis - MIC*    AMPICILLIN <=2 SENSITIVE Sensitive     VANCOMYCIN 1 SENSITIVE Sensitive     GENTAMICIN SYNERGY SENSITIVE Sensitive     * ABUNDANT ENTEROCOCCUS FAECALIS   Staphylococcus aureus - MIC*    CIPROFLOXACIN <=0.5 SENSITIVE Sensitive     ERYTHROMYCIN <=0.25 SENSITIVE Sensitive     GENTAMICIN <=0.5 SENSITIVE Sensitive     OXACILLIN 0.5 SENSITIVE Sensitive     TETRACYCLINE <=1 SENSITIVE Sensitive  VANCOMYCIN <=0.5 SENSITIVE Sensitive     TRIMETH/SULFA <=10 SENSITIVE Sensitive     CLINDAMYCIN <=0.25 SENSITIVE Sensitive     RIFAMPIN <=0.5 SENSITIVE Sensitive     Inducible Clindamycin NEGATIVE Sensitive     * MODERATE STAPHYLOCOCCUS AUREUS  Aerobic/Anaerobic Culture w Gram Stain (surgical/deep wound)     Status: None (Preliminary result)   Collection Time: 09/10/20  8:25 AM   Specimen: Foot, Left; Tissue  Result Value Ref Range Status   Specimen Description TISSUE LEFT FOOT ULCER SPEC A  Final   Special Requests LEFT FOOT ULCER  Final   Gram Stain   Final    RARE WBC PRESENT,  PREDOMINANTLY PMN ABUNDANT GRAM POSITIVE COCCI RARE GRAM NEGATIVE RODS Performed at [place] [address] [place], [place] [address]    Culture   Final    ABUNDANT STAPHYLOCOCCUS AUREUS FEW STENOTROPHOMONAS MALTOPHILIA RARE ENTEROBACTER CLOACAE NO ANAEROBES ISOLATED; CULTURE IN PROGRESS FOR 5 DAYS    Report Status PENDING  Incomplete   Organism ID, Bacteria STAPHYLOCOCCUS AUREUS  Final   Organism ID, Bacteria STENOTROPHOMONAS MALTOPHILIA  Final   Organism ID, Bacteria ENTEROBACTER CLOACAE  Final      Susceptibility   Enterobacter cloacae - MIC*    CEFAZOLIN >=64 RESISTANT Resistant     CEFEPIME <=0.12 SENSITIVE Sensitive     CEFTAZIDIME <=1 SENSITIVE Sensitive     CIPROFLOXACIN 1 SENSITIVE Sensitive     GENTAMICIN <=1 SENSITIVE Sensitive     IMIPENEM 0.5 SENSITIVE Sensitive     TRIMETH/SULFA <=20 SENSITIVE Sensitive     PIP/TAZO <=4 SENSITIVE Sensitive     * RARE ENTEROBACTER CLOACAE   Staphylococcus aureus - MIC*    CIPROFLOXACIN <=0.5 SENSITIVE Sensitive     ERYTHROMYCIN <=0.25 SENSITIVE Sensitive     GENTAMICIN <=0.5 SENSITIVE Sensitive     OXACILLIN 0.5 SENSITIVE Sensitive     TETRACYCLINE <=1 SENSITIVE Sensitive     VANCOMYCIN <=0.5 SENSITIVE Sensitive     TRIMETH/SULFA <=10 SENSITIVE Sensitive     CLINDAMYCIN <=0.25 SENSITIVE Sensitive     RIFAMPIN <=0.5 SENSITIVE Sensitive     Inducible Clindamycin NEGATIVE Sensitive     * ABUNDANT STAPHYLOCOCCUS AUREUS   Stenotrophomonas maltophilia - MIC*    LEVOFLOXACIN 0.5 SENSITIVE Sensitive     TRIMETH/SULFA <=20 SENSITIVE Sensitive     * FEW STENOTROPHOMONAS MALTOPHILIA  Culture, blood (routine x 2)     Status: None (Preliminary result)   Collection Time: 09/11/20  4:28 AM   Specimen: BLOOD LEFT HAND  Result Value Ref Range Status   Specimen Description BLOOD LEFT HAND  Final   Special Requests   Final    BOTTLES DRAWN AEROBIC ONLY Blood Culture results may not be optimal due to an inadequate volume of blood  received in culture bottles   Culture   Final    NO GROWTH 4 DAYS Performed at [facility], [address], [place], [place] [address]    Report Status PENDING  Incomplete  Culture, blood (routine x 2)     Status: None (Preliminary result)   Collection Time: 09/11/20  4:37 AM   Specimen: BLOOD  Result Value Ref Range Status   Specimen Description BLOOD LEFT ANTECUBITAL  Final   Special Requests   Final    BOTTLES DRAWN AEROBIC ONLY Blood Culture results may not be optimal due to an inadequate volume of blood received in culture bottles   Culture   Final    NO GROWTH  4 DAYS Performed at [facility], [place] [address] [place], [place] [address]    Report Status PENDING  Incomplete  Surgical pcr screen     Status: Abnormal   Collection Time: 09/12/20 12:44 PM   Specimen: Nasal Mucosa; Nasal Swab  Result Value Ref Range Status   MRSA, PCR NEGATIVE NEGATIVE Final   Staphylococcus aureus POSITIVE (A) NEGATIVE Final    Comment: (NOTE) The Xpert SA Assay (FDA approved for NASAL specimens in patients 41 years of age and older), is one component of a comprehensive surveillance program. It is not intended to diagnose infection nor to guide or monitor treatment. Performed at [facility], [place] [address], [place], [place] [address]     Radiology Studies: No results found.    Jamaar Howes T. [place]  If 7PM-7AM, please contact night-coverage [email or web address] 09/15/2020, 2:26 PM

## 2020-09-15 NOTE — Progress Notes (Addendum)
Patient is postop day 2 status post bilateral below-knee amputations.  Patient had critical hemoglobin of 6.6 indicating possible strain our receiving blood   Plan for discharge when hemodynamically and medically stable.  Will need 1 week follow-up in our office Bilateral wound vacs.  Right side has no checks but is functioning.  Left side has 1 green check has about 25 cc in it serous pink-colored fluid

## 2020-09-15 NOTE — Progress Notes (Signed)
Inpatient Diabetes Program Recommendations  AACE/ADA: New Consensus Statement on Inpatient Glycemic Control (2015)  Target Ranges:  Prepandial:   less than 140 mg/dL      Peak postprandial:   less than 180 mg/dL (1-2 hours)      Critically ill patients:  140 - 180 mg/dL   Lab Results  Component Value Date   GLUCAP 133 (H) 09/15/2020   HGBA1C 10.1 (H) 07/03/2020    Review of Glycemic Control  Diabetes history: DM 2 Outpatient Diabetes medications: U-500 60 units tid Current orders for Inpatient glycemic control:  Lantus 28 units bid Novolog 0-20 units tid Novolog 15 units tid meal coverage  Inpatient Diabetes Program Recommendations:    Spoke with pt at bedside regarding glucose control at home. Discussed A1c level back in December of 10%. Pt has seen Dr. Dorris Fetch (Endocrinologist) since then in January this year. Pt reports changes were made to her U-500 insulin and was increased to 60 units tid. Encouraged pt to call Dr. Dorris Fetch if glucose trends are above 200 for titration of insulin for better wound healing at home. Pt reports checking glucose 4x/day but trends have not been that good lately.   Pt sleepy and clammy at visit. Spoke with nursing. CBG obtained glucose 73. Regular coke given to pt. RN said dilaudid given to pt she only ate 50% of spaghetti. Will continue to monitor with nursing.  Thanks,  Tama Headings RN, MSN, BC-ADM Inpatient Diabetes Coordinator Team Pager [phone] (8a-5p)

## 2020-09-15 NOTE — Progress Notes (Signed)
Date and time results received: 09/15/20 0435   Test: hemoglobin Critical Value: 6.6  Name of Provider Notified: paged Durward Fortes Orders Received? Or Actions Taken? Awaiting orders. Patient asymptomatic.

## 2020-09-15 NOTE — Progress Notes (Signed)
Initial Nutrition Assessment  DOCUMENTATION CODES:   Morbid obesity  INTERVENTION:   -MVI with minerals daily -1 packet Juven BID, each packet provides 95 calories, 2.5 grams of protein (collagen), and 9.8 grams of carbohydrate (3 grams sugar); also contains 7 grams of L-arginine and L-glutamine, 300 mg vitamin C, 15 mg vitamin E, 1.2 mcg vitamin B-12, 9.5 mg zinc, 200 mg calcium, and 1.5 g  Calcium Beta-hydroxy-Beta-methylbutyrate to support wound healing -Ensure Max po daily, each supplement provides 150 kcal and 30 grams of protein.  -Consider initiation of aggressive bowel regimen as pt has not had a BM since admission; RD communicated with MD via secure chat regarding recommendation  NUTRITION DIAGNOSIS:   Increased nutrient needs related to post-op healing as evidenced by estimated needs.  GOAL:   Patient will meet greater than or equal to 90% of their needs  MONITOR:   PO intake,Supplement acceptance,Labs,Weight trends,Skin,I & O's  REASON FOR ASSESSMENT:   Consult Assessment of nutrition requirement/status  ASSESSMENT:   55 year old F with PMH of IDDM-2 with peripheral neuropathy, morbid obesity, HTN, hypothyroidism, CKD-3A, GERD and depression presented to [facility] with bilateral feet pain, progressive weakness.  She had initial debridement bilateral feet ulcer by Dr. Constance Haw at [facility], and subsequently transferred to [facility] for further surgical care.  Tissue culture with staph aureus, Enterococcus faecalis and Enterobacter closae  Pt admitted with severe sepsis secondary to cellulitis of both feet and possible osteomyelitis.   3/4- s/p Procedure(s) Performed: Bilateral foot plantar surface excisional sharp debridement of necrotic skin, subcutaneous tissue, adipose tissue down to the plantar fascia ( 219 sq cmt; L foot 11X10X1cm on the plantar surface; R foot 11.5X6X1cm on the plantar surface connecting with another area 10X4X1cm extending onto the right medial plantar surface of  the foot); ? ischemic changes to the left 2nd toe with  3/6- s/p PROCEDURE:  IRRIGATION AND DEBRIDEMENT BILATERAL FEET; Tissue cultures sent. 3/7- pt with circumferential abscess to both the and does not have foot salvage intervention options; plan for bilateral BKAs 3/9- s/p PROCEDURE: Transtibial amputation bilateral. Application of Prevena wound VAC bilateral lower extremities  Reviewed I/O's: +1 L x 24 hours and +3.5 L since admission  UOP: 1 L x 24 hours  Drain output: 10 ml x 24 hours  Pt very drowsy at time of visit. Minimal response to voice and touch. She was unable to stay awake long enough to engage in conversation.   Pt with good appetite. Noted meal completion 100%.   Reviewed wt hx; pt has experienced a 13.8% wt loss over the past 6 months, which is significant for time frame, however, suspect some wt loss is related to amputations, uncontrolled DM, and fluid losses.   Pt with increased needs for post-operative healing and would benefit from oral nutritional supplements.   Noted no BM since admission; sent secure chat to MD to suggest bowel regimen.   Medications reviewed and include vitamin D3, colace, and magnesium oxide.   Obesity is a complex, chronic medical condition that is optimally managed by a multidisciplinary care team. Weight loss is not an ideal goal for an acute inpatient hospitalization. However, if further work-up for obesity is warranted, consider outpatient referral to outpatient bariatric service and/or [facility].   Lab Results  Component Value Date   HGBA1C 10.1 (H) 07/03/2020   PTA DM medications are 60 units U-500 TID.   Labs reviewed: CBGS: 73-161 (inpatient orders for glycemic control are 0-20 units insulin aspart  TID with meal,s 15 units insulin aspart TID with meals, and 28 units insulin glargine BID).   NUTRITION - FOCUSED PHYSICAL EXAM:  Flowsheet Row Most Recent Value  Orbital Region No  depletion  Upper Arm Region No depletion  Thoracic and Lumbar Region No depletion  Buccal Region No depletion  Temple Region No depletion  Clavicle Bone Region No depletion  Clavicle and Acromion Bone Region No depletion  Scapular Bone Region No depletion  Dorsal Hand No depletion  Patellar Region No depletion  Anterior Thigh Region No depletion  Posterior Calf Region No depletion  Edema (RD Assessment) Mild  Hair Reviewed  Eyes Reviewed  Mouth Reviewed  Skin Reviewed  Nails Reviewed       Diet Order:   Diet Order            Diet Carb Modified Fluid consistency: Thin; Room service appropriate? Yes  Diet effective now                 EDUCATION NEEDS:   No education needs have been identified at this time  Skin:  Skin Assessment: Skin Integrity Issues: Skin Integrity Issues:: Wound VAC Wound Vac: s/p bilateral BKAs  Last BM:  Unknown  Height:   Ht Readings from Last 1 Encounters:  09/13/20 5' 7.52" (1.715 m)    Weight:   Wt Readings from Last 1 Encounters:  09/13/20 (!) 148.1 kg    Ideal Body Weight:  53.4 kg (adjusted for bilateral BKAs)  BMI:  Body mass index is 50.35 kg/m.  Estimated Nutritional Needs:   Kcal:  1900-2100  Protein:  120-135 grams  Fluid:  > 1.9 L    Loistine Chance, RD, LDN, [place] Registered Dietitian II Certified Diabetes Care and Education Specialist Please refer to [facility] for RD and/or RD on-call/weekend/after hours pager

## 2020-09-16 DIAGNOSIS — L97513 Non-pressure chronic ulcer of other part of right foot with necrosis of muscle: Secondary | ICD-10-CM | POA: Diagnosis not present

## 2020-09-16 DIAGNOSIS — M86071 Acute hematogenous osteomyelitis, right ankle and foot: Secondary | ICD-10-CM | POA: Diagnosis not present

## 2020-09-16 DIAGNOSIS — Z6841 Body Mass Index (BMI) 40.0 and over, adult: Secondary | ICD-10-CM | POA: Diagnosis not present

## 2020-09-16 DIAGNOSIS — A4101 Sepsis due to Methicillin susceptible Staphylococcus aureus: Secondary | ICD-10-CM | POA: Diagnosis not present

## 2020-09-16 LAB — TYPE AND SCREEN
ABO/RH(D): A NEG
Antibody Screen: NEGATIVE
Unit division: 0
Unit division: 0
Unit division: 0
Unit division: 0
Unit division: 0
Unit division: 0
Unit division: 0
Unit division: 0

## 2020-09-16 LAB — CBC
HCT: 26.2 % — ABNORMAL LOW (ref 36.0–46.0)
Hemoglobin: 8.6 g/dL — ABNORMAL LOW (ref 12.0–15.0)
MCH: 29.6 pg (ref 26.0–34.0)
MCHC: 32.8 g/dL (ref 30.0–36.0)
MCV: 90 fL (ref 80.0–100.0)
Platelets: 326 10*3/uL (ref 150–400)
RBC: 2.91 MIL/uL — ABNORMAL LOW (ref 3.87–5.11)
RDW: 15.7 % — ABNORMAL HIGH (ref 11.5–15.5)
WBC: 11 10*3/uL — ABNORMAL HIGH (ref 4.0–10.5)
nRBC: 0.2 % (ref 0.0–0.2)

## 2020-09-16 LAB — RENAL FUNCTION PANEL
Albumin: <1 g/dL — ABNORMAL LOW (ref 3.5–5.0)
Anion gap: 5 (ref 5–15)
BUN: 26 mg/dL — ABNORMAL HIGH (ref 6–20)
CO2: 27 mmol/L (ref 22–32)
Calcium: 7.7 mg/dL — ABNORMAL LOW (ref 8.9–10.3)
Chloride: 105 mmol/L (ref 98–111)
Creatinine, Ser: 1.03 mg/dL — ABNORMAL HIGH (ref 0.44–1.00)
GFR, Estimated: >60 mL/min (ref >60)
Glucose, Bld: 165 mg/dL — ABNORMAL HIGH (ref 70–99)
Phosphorus: 2.5 mg/dL (ref 2.5–4.6)
Potassium: 4.4 mmol/L (ref 3.5–5.1)
Sodium: 137 mmol/L (ref 135–145)

## 2020-09-16 LAB — IRON AND TIBC
Iron: 40 ug/dL (ref 28–170)
Saturation Ratios: 16 % (ref 10.4–31.8)
TIBC: 244 ug/dL — ABNORMAL LOW (ref 250–450)
UIBC: 204 ug/dL

## 2020-09-16 LAB — BPAM RBC
Blood Product Expiration Date: 202204062359
Blood Product Expiration Date: 202204062359
Blood Product Expiration Date: 202204072359
Blood Product Expiration Date: 202204072359
Blood Product Expiration Date: 202204102359
Blood Product Expiration Date: 202204102359
Blood Product Expiration Date: 202204122359
Blood Product Expiration Date: 202204122359
ISSUE DATE / TIME: 202203091226
ISSUE DATE / TIME: 202203091226
ISSUE DATE / TIME: 202203091547
ISSUE DATE / TIME: 202203091547
ISSUE DATE / TIME: 202203100753
ISSUE DATE / TIME: 202203100753
ISSUE DATE / TIME: 202203110553
ISSUE DATE / TIME: 202203111010
Unit Type and Rh: 600
Unit Type and Rh: 600
Unit Type and Rh: 600
Unit Type and Rh: 600
Unit Type and Rh: 600
Unit Type and Rh: 600
Unit Type and Rh: 600
Unit Type and Rh: 600

## 2020-09-16 LAB — CULTURE, BLOOD (ROUTINE X 2)
Culture: NO GROWTH
Culture: NO GROWTH

## 2020-09-16 LAB — GLUCOSE, CAPILLARY
Glucose-Capillary: 189 mg/dL — ABNORMAL HIGH (ref 70–99)
Glucose-Capillary: 187 mg/dL — ABNORMAL HIGH (ref 70–99)
Glucose-Capillary: 105 mg/dL — ABNORMAL HIGH (ref 70–99)
Glucose-Capillary: 87 mg/dL (ref 70–99)

## 2020-09-16 LAB — VITAMIN B12: Vitamin B-12: 399 pg/mL (ref 180–914)

## 2020-09-16 LAB — SARS CORONAVIRUS 2 (TAT 6-24 HRS): SARS Coronavirus 2: NEGATIVE

## 2020-09-16 LAB — RETICULOCYTES
Immature Retic Fract: 36.8 % — ABNORMAL HIGH (ref 2.3–15.9)
RBC.: 2.95 MIL/uL — ABNORMAL LOW (ref 3.87–5.11)
Retic Count, Absolute: 87.9 10*3/uL (ref 19.0–186.0)
Retic Ct Pct: 3 % (ref 0.4–3.1)

## 2020-09-16 LAB — AEROBIC/ANAEROBIC CULTURE W GRAM STAIN (SURGICAL/DEEP WOUND)

## 2020-09-16 LAB — FERRITIN: Ferritin: 123 ng/mL (ref 11–307)

## 2020-09-16 LAB — MAGNESIUM: Magnesium: 1.9 mg/dL (ref 1.7–2.4)

## 2020-09-16 LAB — PREALBUMIN: Prealbumin: 11.1 mg/dL — ABNORMAL LOW (ref 18–38)

## 2020-09-16 LAB — HEMOGLOBIN A1C
Hgb A1c MFr Bld: 8.8 % — ABNORMAL HIGH (ref 4.8–5.6)
Mean Plasma Glucose: 205.86 mg/dL

## 2020-09-16 LAB — FOLATE: Folate: 4.1 ng/mL — ABNORMAL LOW (ref >5.9)

## 2020-09-16 MED ORDER — FLEET ENEMA 7-19 GM/118ML RE ENEM
1.0000 | ENEMA | Freq: Once | RECTAL | Status: AC
  Administered 2020-09-17: 1 via RECTAL
  Filled 2020-09-16: qty 1

## 2020-09-16 MED ORDER — FOLIC ACID 1 MG PO TABS
1.0000 mg | ORAL_TABLET | Freq: Every day | ORAL | Status: DC
  Administered 2020-09-16 – 2020-09-17 (×2): 1 mg via ORAL
  Filled 2020-09-16 (×2): qty 1

## 2020-09-16 NOTE — Progress Notes (Signed)
PROGRESS NOTE  Michelle Skinner VHQ:[id number] DOB: 1966/02/01   PCP: Monico Blitz, MD  Patient is from: Home  DOA: 09/06/2020 LOS: 36  Chief complaints: Pain in feet and generalized weakness  Brief Narrative / Interim history: 55 year old F with PMH of IDDM-2 with peripheral neuropathy, morbid obesity, HTN, hypothyroidism, CKD-3A, GERD and depression presented to [facility] with bilateral feet pain, progressive weakness.  She had initial debridement bilateral feet ulcer by Dr. Constance Haw at [facility], and subsequently transferred to [facility] for further surgical care.  Tissue culture with staph aureus, Enterococcus faecalis and Enterobacter closae  She underwent I&D of bilateral feet by Dr. Sharol Given on 09/10/2020 and noted to have circumferential abscess with osteomyelitis and cellulitis.  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  She also have MSSA bacteremia, and ABLA requiring blood transfusions.  See individual problem list below for more on hospital course.  Subjective: Seen and examined earlier this morning.  No major events overnight of this morning.  No complaints other than constipation.  She denies pain.  She has not a bowel movement yet.  She says she felt nauseous when she tried to drink mag citrate.  She refuses enema but likes to work on [employer] citrate.  Objective: Vitals:   09/15/20 1555 09/15/20 2039 09/16/20 0509 09/16/20 0809  BP: (!) 152/67 (!) 129/54 (!) 128/56 (!) 151/57  Pulse: 71 73 70 63  Resp: 17 16 16 16   Temp:  98.2 F (36.8 C) 98 F (36.7 C) (!) 97.4 F (36.3 C)  TempSrc:  Oral Oral Oral  SpO2: 98% 98% 100% 100%  Weight:      Height:        Intake/Output Summary (Last 24 hours) at 09/16/2020 1258 Last data filed at 09/16/2020 1249 Gross per 24 hour  Intake 340.67 ml  Output 1210 ml  Net -869.33 ml   Filed Weights   09/06/20 1249 09/06/20 2200 09/13/20 1141  Weight: (!) 158.8 kg (!) 148.1 kg (!) 148.1 kg    Examination:  GENERAL: No  apparent distress.  Nontoxic. HEENT: MMM.  Vision and hearing grossly intact.  NECK: Supple.  No apparent JVD.  RESP: On RA.  No IWOB.  Fair aeration bilaterally. CVS:  RRR. Heart sounds normal.  ABD/GI/GU: BS+. Abd soft, NTND.  MSK/EXT: B/L BKA with wound VAC.  Small serosanguineous in the left canister SKIN: no apparent skin lesion or wound NEURO: Awake, alert and oriented appropriately.  No apparent focal neuro deficit. PSYCH: Calm. Normal affect.   Procedures:  3/4-incision and drainage of bilateral feet 3/7-incision and drainage of bilateral feet 3/9-bilateral BKA with wound VAC placement  Microbiology summarized: 3/2-COVID-19 and influenza PCR nonreactive. 3/2-blood culture with staph aureus 3/2-urine culture with 1000 colonies of Proteus mirabilis and 3000 colonies of Klebsiella pneumonia 3/4-tissue culture with Staph aureus and Enterococcus faecalis 3/6-surgical culture with staph aureus, stenotrophomonas maltophilia and Enterobacter cloacae 3/7-blood cultures NGTD  Assessment & Plan: Sepsis due to MSSA bacteremia/BLE osteomyelitis/cellulitis in patient with chronic lymphedema/wound -S/p bilateral BKA with wound VAC placement on 09/13/2020 -TTE on 3/6 - for vegetation.  ID did not feel TEE is needed, and recommended 4 weeks of IV Ancef from date of negative blood cultures (3/7) -Pain control with bowel regimen -Continue PT/OT  AKI on CKD-3A: AKI seems to have resolved. Recent Labs    07/03/20 1001 09/06/20 1401 09/07/20 0622 09/07/20 1126 09/11/20 0428 09/12/20 0239 09/13/20 0514 09/14/20 0347 09/15/20 0325 09/16/20 0333  BUN 13 52* 52* 51*  36* 30* 26* 31* 35* 26*  CREATININE 1.07* 1.94* 1.81* 1.79* 1.47* 1.41* 1.31* 1.40* 1.43* 1.03*  -Avoid nephrotoxic meds -Continue monitoring  Acute postop blood loss anemia superimposed on anemia of renal disease: 4 units of blood so far. Anemia panel with folate deficiency. Recent Labs    09/06/20 1401 09/07/20 0622  09/11/20 0428 09/12/20 0239 09/13/20 0514 09/13/20 1854 09/14/20 0347 09/15/20 0325 09/15/20 1500 09/16/20 0333  HGB 9.8* 8.8* 7.5* 7.6* 7.0* 9.6* 7.7* 6.6* 9.9* 8.6*  -Transfuse for Hgb less than 7.0 -P.o. folic acid  Acute urinary retention with bladder over distention: She had about a liter of UOP after Foley irrigation this morning -Foley was present on admission per patient but no documentation. -Continue indwelling Foley at least for a week from 3/11 for bladder decompression  Uncontrolled IDDM-2 with hyperglycemia, hyperlipidemia, neuropathy and CKD-3A: A1c 8.8%.  On high-dose Tresiba and short acting insulin at home. Recent Labs  Lab 09/15/20 1546 09/15/20 1720 09/15/20 2056 09/16/20 0836 09/16/20 1117  GLUCAP 73 117* 146* 189* 187*  -Continue Lantus 28 units twice daily, NovoLog AC 15 units and SSI-resistant -Continue statin  Essential hypertension: HR and DBP improved after decreasing metoprolol -Continue Toprol-XL 50 mg daily  Hypothyroidism -Continue home Synthroid  Hyperlipidemia -Continue statin  Anxiety and depression -Continue home meds  GERD -Continue Protonix  Debility/physical deconditioning -PT/OT-recommended SNF  Constipation -Encouraged to take her as needed bowel regimens  Morbid obesity Body mass index is 50.35 kg/m. Nutrition Problem: Increased nutrient needs Etiology: post-op healing-Could benefit from GLP-1 inhibitors in the setting of diabetes Signs/Symptoms: estimated needs Interventions: MVI,Premier Protein,Juven   DVT prophylaxis:  SCDs Start: 09/10/20 0932  On subcu Loveno  Code Status: Full code Family Communication: Patient and/or RN. Available if any question.  Level of care: Telemetry Medical Status is: Inpatient  Remains inpatient appropriate because:Unsafe d/c plan, IV treatments appropriate due to intensity of illness or inability to take PO and Inpatient level of care appropriate due to severity of  illness   Dispo: The patient is from: Home              Anticipated d/c is to: SNF              Patient currently is not medically stable to d/c.   Difficult to place patient No       Consultants:  General surgery Orthopedic surgery Infectious disease   Sch Meds:  Scheduled Meds: . atorvastatin  40 mg Oral QHS  . Chlorhexidine Gluconate Cloth  6 each Topical Q0600  . cholecalciferol  5,000 Units Oral Daily  . enoxaparin (LOVENOX) injection  70 mg Subcutaneous Q24H  . FLUoxetine  40 mg Oral Daily  . folic acid  1 mg Oral Daily  . insulin aspart  0-20 Units Subcutaneous TID WC  . insulin aspart  15 Units Subcutaneous TID WC  . insulin glargine  28 Units Subcutaneous BID  . levothyroxine  150 mcg Oral QAC breakfast  . magnesium oxide  200 mg Oral Daily  . metoprolol succinate  50 mg Oral QHS  . multivitamin with minerals  1 tablet Oral Daily  . mupirocin ointment  1 application Nasal BID  . nutrition supplement (JUVEN)  1 packet Oral BID BM  . pantoprazole  40 mg Oral Daily  . Ensure Max Protein  11 oz Oral QHS  . senna  1 tablet Oral BID  . sodium chloride flush  10-40 mL Intracatheter Q12H   Continuous Infusions: . sodium  chloride Stopped (09/13/20 1637)  .  ceFAZolin (ANCEF) IV 2 g (09/16/20 2505)  . methocarbamol (ROBAXIN) IV     PRN Meds:.acetaminophen, dextrose, HYDROmorphone (DILAUDID) injection, magnesium citrate, methocarbamol **OR** methocarbamol (ROBAXIN) IV, metoCLOPramide **OR** metoCLOPramide (REGLAN) injection, ondansetron **OR** ondansetron (ZOFRAN) IV, oxyCODONE, oxyCODONE, polyethylene glycol, senna-docusate, sodium chloride flush, traZODone  Antimicrobials: Anti-infectives (From admission, onward)   Start     Dose/Rate Route Frequency Ordered Stop   09/13/20 2200  ceFAZolin (ANCEF) IVPB 2g/100 mL premix        2 g 200 mL/hr over 30 Minutes Intravenous Every 8 hours 09/13/20 1338     09/13/20 0600  ceFAZolin (ANCEF) 3 g in dextrose 5 % 50 mL IVPB   Status:  Discontinued        3 g 100 mL/hr over 30 Minutes Intravenous On call to O.R. 09/12/20 1252 09/13/20 1818   09/10/20 1600  Ampicillin-Sulbactam (UNASYN) 3 g in sodium chloride 0.9 % 100 mL IVPB  Status:  Discontinued        3 g 200 mL/hr over 30 Minutes Intravenous Every 6 hours 09/10/20 1437 09/13/20 1338   09/10/20 0730  ceFAZolin (ANCEF) 3 g in dextrose 5 % 50 mL IVPB  Status:  Discontinued        3 g 100 mL/hr over 30 Minutes Intravenous On call to O.R. 09/10/20 3976 09/10/20 0928   09/10/20 0646  ceFAZolin (ANCEF) 2-4 GM/100ML-% IVPB  Status:  Discontinued       Note to Pharmacy: Ladoris Gene   : cabinet override      09/10/20 0646 09/10/20 0709   09/10/20 0646  ceFAZolin (ANCEF) 1-4 GM/50ML-% IVPB       Note to Pharmacy: Ladoris Gene   : cabinet override      09/10/20 0646 09/10/20 0812   09/08/20 1400  ceFAZolin (ANCEF) IVPB 2g/100 mL premix  Status:  Discontinued        2 g 200 mL/hr over 30 Minutes Intravenous Every 8 hours 09/08/20 0947 09/10/20 1432   09/07/20 1700  vancomycin (VANCOREADY) IVPB 1750 mg/350 mL  Status:  Discontinued        1,750 mg 175 mL/hr over 120 Minutes Intravenous Every 24 hours 09/06/20 1606 09/08/20 0947   09/07/20 1600  vancomycin (VANCOREADY) IVPB 1750 mg/350 mL  Status:  Discontinued        1,750 mg 175 mL/hr over 120 Minutes Intravenous Every 24 hours 09/06/20 1605 09/06/20 1606   09/06/20 1615  vancomycin (VANCOREADY) IVPB 2000 mg/400 mL        2,000 mg 200 mL/hr over 120 Minutes Intravenous  Once 09/06/20 1605 09/06/20 1856   09/06/20 1415  cefTRIAXone (ROCEPHIN) 2 g in sodium chloride 0.9 % 100 mL IVPB  Status:  Discontinued        2 g 200 mL/hr over 30 Minutes Intravenous Every 24 hours 09/06/20 1404 09/08/20 0947       I have personally reviewed the following labs and images: CBC: Recent Labs  Lab 09/11/20 0428 09/12/20 0239 09/13/20 0514 09/13/20 1854 09/14/20 0347 09/15/20 0325 09/15/20 1500 09/16/20 0333  WBC  15.9* 12.5* 10.6*  --  12.8* 11.4*  --  11.0*  NEUTROABS 12.9* 9.7* 8.0*  --  11.1* 8.1*  --   --   HGB 7.5* 7.6* 7.0* 9.6* 7.7* 6.6* 9.9* 8.6*  HCT 23.7* 23.7* 22.2* 28.7* 25.0* 21.6* 31.0* 26.2*  MCV 87.5 88.4 89.5  --  91.6 91.9  --  90.0  PLT 289  296 301  --  334 348  --  326   BMP &GFR Recent Labs  Lab 09/12/20 0239 09/13/20 0514 09/14/20 0347 09/15/20 0325 09/16/20 0333  NA 136 134* 134* 136 137  K 3.6 4.1 5.7* 4.4 4.4  CL 106 104 103 106 105  CO2 22 23 26 26 27   GLUCOSE 186* 212* 437* 123* 165*  BUN 30* 26* 31* 35* 26*  CREATININE 1.41* 1.31* 1.40* 1.43* 1.03*  CALCIUM 7.5* 7.5* 7.4* 7.4* 7.7*  MG 2.0 1.9 1.9 1.9 1.9  PHOS  --   --   --   --  2.5   Estimated Creatinine Clearance: 96.6 mL/min (A) (by C-G formula based on SCr of 1.03 mg/dL (H)). Liver & Pancreas: Recent Labs  Lab 09/11/20 0428 09/12/20 0239 09/13/20 0514 09/14/20 0347 09/15/20 0325 09/16/20 0333  AST 12* 12* 12* 19 18  --   ALT 5 6 6 6 5   --   ALKPHOS 99 95 106 76 74  --   BILITOT 0.7 0.6 0.4 0.1* 0.1*  --   PROT 5.8* 5.9* 6.0* 5.6* 5.3*  --   ALBUMIN <1.0* <1.0* <1.0* <1.0* <1.0* <1.0*   No results for input(s): LIPASE, AMYLASE in the last 168 hours. No results for input(s): AMMONIA in the last 168 hours. Diabetic: Recent Labs    09/16/20 0333  HGBA1C 8.8*   Recent Labs  Lab 09/15/20 1546 09/15/20 1720 09/15/20 2056 09/16/20 0836 09/16/20 1117  GLUCAP 73 117* 146* 189* 187*   Cardiac Enzymes: No results for input(s): CKTOTAL, CKMB, CKMBINDEX, TROPONINI in the last 168 hours. No results for input(s): PROBNP in the last 8760 hours. Coagulation Profile: No results for input(s): INR, PROTIME in the last 168 hours. Thyroid Function Tests: No results for input(s): TSH, T4TOTAL, FREET4, T3FREE, THYROIDAB in the last 72 hours. Lipid Profile: No results for input(s): CHOL, HDL, LDLCALC, TRIG, CHOLHDL, LDLDIRECT in the last 72 hours. Anemia Panel: Recent Labs    09/16/20 0333   VITAMINB12 399  FOLATE 4.1*  FERRITIN 123  TIBC 244*  IRON 40  RETICCTPCT 3.0   Urine analysis:    Component Value Date/Time   COLORURINE YELLOW 09/06/2020 1401   APPEARANCEUR CLEAR 09/06/2020 1401   LABSPEC 1.017 09/06/2020 1401   PHURINE 5.0 09/06/2020 1401   GLUCOSEU >=500 (A) 09/06/2020 1401   HGBUR SMALL (A) 09/06/2020 1401   BILIRUBINUR NEGATIVE 09/06/2020 1401   KETONESUR 20 (A) 09/06/2020 1401   PROTEINUR 100 (A) 09/06/2020 1401   UROBILINOGEN 0.2 08/05/2008 1400   NITRITE NEGATIVE 09/06/2020 1401   LEUKOCYTESUR NEGATIVE 09/06/2020 1401   Sepsis Labs: Invalid input(s): PROCALCITONIN, [place]  Microbiology: Recent Results (from the past 240 hour(s))  Resp Panel by RT-PCR (Flu A&B, Covid) Nasopharyngeal Swab     Status: None   Collection Time: 09/06/20  2:01 PM   Specimen: Nasopharyngeal Swab; Nasopharyngeal(NP) swabs in vial transport medium  Result Value Ref Range Status   SARS Coronavirus 2 by RT PCR NEGATIVE NEGATIVE Final    Comment: (NOTE) SARS-CoV-2 target nucleic acids are NOT DETECTED.  The SARS-CoV-2 RNA is generally detectable in upper respiratory specimens during the acute phase of infection. The lowest concentration of SARS-CoV-2 viral copies this assay can detect is 138 copies/mL. A negative result does not preclude SARS-Cov-2 infection and should not be used as the sole basis for treatment or other patient management decisions. A negative result may occur with  improper specimen collection/handling, submission of specimen other than nasopharyngeal swab,  presence of viral mutation(s) within the areas targeted by this assay, and inadequate number of viral copies(<138 copies/mL). A negative result must be combined with clinical observations, patient history, and epidemiological information. The expected result is Negative.  Fact Sheet for Patients:  EntrepreneurPulse.com.au  Fact Sheet for Healthcare Providers:   IncredibleEmployment.be  This test is no t yet approved or cleared by the [place] FDA and  has been authorized for detection and/or diagnosis of SARS-CoV-2 by FDA under an Emergency Use Authorization (EUA). This EUA will remain  in effect (meaning this test can be used) for the duration of the COVID-19 declaration under Section 564(b)(1) of the Act, 21 U.S.C.section 360bbb-3(b)(1), unless the authorization is terminated  or revoked sooner.       Influenza A by PCR NEGATIVE NEGATIVE Final   Influenza B by PCR NEGATIVE NEGATIVE Final    Comment: (NOTE) The Xpert Xpress SARS-CoV-2/FLU/RSV plus assay is intended as an aid in the diagnosis of influenza from Nasopharyngeal swab specimens and should not be used as a sole basis for treatment. Nasal washings and aspirates are unacceptable for Xpert Xpress SARS-CoV-2/FLU/RSV testing.  Fact Sheet for Patients: EntrepreneurPulse.com.au  Fact Sheet for Healthcare Providers: IncredibleEmployment.be  This test is not yet approved or cleared by the [place] FDA and has been authorized for detection and/or diagnosis of SARS-CoV-2 by FDA under an Emergency Use Authorization (EUA). This EUA will remain in effect (meaning this test can be used) for the duration of the COVID-19 declaration under Section 564(b)(1) of the Act, 21 U.S.C. section 360bbb-3(b)(1), unless the authorization is terminated or revoked.  Performed at [facility], [address], [place], [place] [address]   Urine culture     Status: Abnormal   Collection Time: 09/06/20  2:01 PM   Specimen: In/Out Cath Urine  Result Value Ref Range Status   Specimen Description   Final    IN/OUT CATH URINE Performed at [facility], [address], [place], [place] [address]    Special Requests   Final    NONE Performed at [facility], [address] [place], [place] [address]    Culture (A)  Final    1,000 COLONIES/mL  PROTEUS MIRABILIS 3,000 COLONIES/mL KLEBSIELLA PNEUMONIAE    Report Status 09/09/2020 FINAL  Final   Organism ID, Bacteria PROTEUS MIRABILIS (A)  Final   Organism ID, Bacteria KLEBSIELLA PNEUMONIAE (A)  Final      Susceptibility   Klebsiella pneumoniae - MIC*    AMPICILLIN RESISTANT Resistant     CEFAZOLIN <=4 SENSITIVE Sensitive     CEFEPIME <=0.12 SENSITIVE Sensitive     CEFTRIAXONE <=0.25 SENSITIVE Sensitive     CIPROFLOXACIN <=0.25 SENSITIVE Sensitive     GENTAMICIN <=1 SENSITIVE Sensitive     IMIPENEM <=0.25 SENSITIVE Sensitive     NITROFURANTOIN 32 SENSITIVE Sensitive     TRIMETH/SULFA <=20 SENSITIVE Sensitive     AMPICILLIN/SULBACTAM <=2 SENSITIVE Sensitive     PIP/TAZO <=4 SENSITIVE Sensitive     * 3,000 COLONIES/mL KLEBSIELLA PNEUMONIAE   Proteus mirabilis - MIC*    AMPICILLIN <=2 SENSITIVE Sensitive     CEFAZOLIN 8 SENSITIVE Sensitive     CEFEPIME <=0.12 SENSITIVE Sensitive     CEFTRIAXONE <=0.25 SENSITIVE Sensitive     CIPROFLOXACIN >=4 RESISTANT Resistant     GENTAMICIN <=1 SENSITIVE Sensitive     IMIPENEM 2 SENSITIVE Sensitive     NITROFURANTOIN 128 RESISTANT Resistant     TRIMETH/SULFA >=320 RESISTANT Resistant  AMPICILLIN/SULBACTAM <=2 SENSITIVE Sensitive     PIP/TAZO <=4 SENSITIVE Sensitive     * 1,000 COLONIES/mL PROTEUS MIRABILIS  Blood Culture (routine x 2)     Status: Abnormal   Collection Time: 09/06/20  2:14 PM   Specimen: BLOOD  Result Value Ref Range Status   Specimen Description   Final    BLOOD LEFT ANTECUBITAL Performed at [facility] [address] [place], [place] [address]    Special Requests   Final    BOTTLES DRAWN AEROBIC AND ANAEROBIC Blood Culture adequate volume Performed at [facility], [address], [place], [place] [address]    Culture  Setup Time   Final    GRAM POSITIVE COCCI IN BOTH AEROBIC AND ANAEROBIC BOTTLES Gram Stain Report Called to,Read Back By and Verified With: LARIMORE,[email or web address]  BY MATTHEWS, B  3.3.2022 Performed at [place] ID to follow CRITICAL RESULT CALLED TO, READ BACK BY AND VERIFIED WITH: [place] 09/07/20 A BROWNING Performed at [facility], [place] [address] [place], [place] [address]    Culture STAPHYLOCOCCUS AUREUS (A)  Final   Report Status 09/09/2020 FINAL  Final   Organism ID, Bacteria STAPHYLOCOCCUS AUREUS  Final      Susceptibility   Staphylococcus aureus - MIC*    CIPROFLOXACIN <=0.5 SENSITIVE Sensitive     ERYTHROMYCIN <=0.25 SENSITIVE Sensitive     GENTAMICIN <=0.5 SENSITIVE Sensitive     OXACILLIN <=0.25 SENSITIVE Sensitive     TETRACYCLINE <=1 SENSITIVE Sensitive     VANCOMYCIN <=0.5 SENSITIVE Sensitive     TRIMETH/SULFA <=10 SENSITIVE Sensitive     CLINDAMYCIN <=0.25 SENSITIVE Sensitive     RIFAMPIN <=0.5 SENSITIVE Sensitive     Inducible Clindamycin NEGATIVE Sensitive     * STAPHYLOCOCCUS AUREUS  Blood Culture ID Panel (Reflexed)     Status: Abnormal   Collection Time: 09/06/20  2:14 PM  Result Value Ref Range Status   Enterococcus faecalis NOT DETECTED NOT DETECTED Final   Enterococcus Faecium NOT DETECTED NOT DETECTED Final   Listeria monocytogenes NOT DETECTED NOT DETECTED Final   Staphylococcus species DETECTED (A) NOT DETECTED Final    Comment: CRITICAL RESULT CALLED TO, READ BACK BY AND VERIFIED WITH: Martin Majestic RN [id number] 09/07/20 A BROWNING    Staphylococcus aureus (BCID) DETECTED (A) NOT DETECTED Final    Comment: CRITICAL RESULT CALLED TO, READ BACK BY AND VERIFIED WITH: Martin Majestic RN [id number] 09/07/20 A BROWNING    Staphylococcus epidermidis NOT DETECTED NOT DETECTED Final   Staphylococcus lugdunensis NOT DETECTED NOT DETECTED Final   Streptococcus species NOT DETECTED NOT DETECTED Final   Streptococcus agalactiae NOT DETECTED NOT DETECTED Final   Streptococcus pneumoniae NOT DETECTED NOT DETECTED Final   Streptococcus pyogenes NOT DETECTED NOT DETECTED Final   A.calcoaceticus-baumannii NOT DETECTED NOT DETECTED  Final   Bacteroides fragilis NOT DETECTED NOT DETECTED Final   Enterobacterales NOT DETECTED NOT DETECTED Final   Enterobacter cloacae complex NOT DETECTED NOT DETECTED Final   Escherichia coli NOT DETECTED NOT DETECTED Final   Klebsiella aerogenes NOT DETECTED NOT DETECTED Final   Klebsiella oxytoca NOT DETECTED NOT DETECTED Final   Klebsiella pneumoniae NOT DETECTED NOT DETECTED Final   Proteus species NOT DETECTED NOT DETECTED Final   Salmonella species NOT DETECTED NOT DETECTED Final   Serratia marcescens NOT DETECTED NOT DETECTED Final   Haemophilus influenzae NOT DETECTED NOT DETECTED Final   Neisseria meningitidis NOT DETECTED NOT DETECTED Final   Pseudomonas aeruginosa NOT DETECTED  NOT DETECTED Final   Stenotrophomonas maltophilia NOT DETECTED NOT DETECTED Final   Candida albicans NOT DETECTED NOT DETECTED Final   Candida auris NOT DETECTED NOT DETECTED Final   Candida glabrata NOT DETECTED NOT DETECTED Final   Candida krusei NOT DETECTED NOT DETECTED Final   Candida parapsilosis NOT DETECTED NOT DETECTED Final   Candida tropicalis NOT DETECTED NOT DETECTED Final   Cryptococcus neoformans/gattii NOT DETECTED NOT DETECTED Final   Meth resistant mecA/C and MREJ NOT DETECTED NOT DETECTED Final    Comment: Performed at [facility], [place] [address], [place], [place] [address]  Blood Culture (routine x 2)     Status: None   Collection Time: 09/06/20  3:59 PM   Specimen: BLOOD RIGHT HAND  Result Value Ref Range Status   Specimen Description BLOOD RIGHT HAND  Final   Special Requests   Final    BOTTLES DRAWN AEROBIC AND ANAEROBIC Blood Culture adequate volume   Culture   Final    NO GROWTH 5 DAYS Performed at [facility], [address], [place], [place] [address]    Report Status 09/11/2020 FINAL  Final  MRSA PCR Screening     Status: None   Collection Time: 09/06/20  9:46 PM   Specimen: Nasal Mucosa; Nasopharyngeal  Result Value Ref Range Status   MRSA by PCR NEGATIVE  NEGATIVE Final    Comment:        The GeneXpert MRSA Assay (FDA approved for NASAL specimens only), is one component of a comprehensive MRSA colonization surveillance program. It is not intended to diagnose MRSA infection nor to guide or monitor treatment for MRSA infections. Performed at [facility], [address] [place], [place] [address]   Aerobic/Anaerobic Culture w Gram Stain (surgical/deep wound)     Status: None   Collection Time: 09/08/20 11:04 AM   Specimen: PATH Other; Tissue  Result Value Ref Range Status   Specimen Description   Final    FOOT RIGHT Performed at [facility], [address], [place], [place] [address]    Special Requests   Final    NONE Performed at [facility], [address] [place], [place] [address]    Gram Stain   Final    RARE WBC PRESENT, PREDOMINANTLY PMN MODERATE GRAM POSITIVE COCCI IN PAIRS IN CLUSTERS    Culture   Final    ABUNDANT STAPHYLOCOCCUS AUREUS MODERATE ENTEROCOCCUS FAECALIS NO ANAEROBES ISOLATED Performed at [facility], [place] [address] [place], [place] [address]    Report Status 09/14/2020 FINAL  Final   Organism ID, Bacteria STAPHYLOCOCCUS AUREUS  Final   Organism ID, Bacteria ENTEROCOCCUS FAECALIS  Final      Susceptibility   Enterococcus faecalis - MIC*    AMPICILLIN <=2 SENSITIVE Sensitive     VANCOMYCIN 1 SENSITIVE Sensitive     GENTAMICIN SYNERGY SENSITIVE Sensitive     * MODERATE ENTEROCOCCUS FAECALIS   Staphylococcus aureus - MIC*    CIPROFLOXACIN <=0.5 SENSITIVE Sensitive     ERYTHROMYCIN <=0.25 SENSITIVE Sensitive     GENTAMICIN <=0.5 SENSITIVE Sensitive     OXACILLIN 0.5 SENSITIVE Sensitive     TETRACYCLINE <=1 SENSITIVE Sensitive     VANCOMYCIN <=0.5 SENSITIVE Sensitive     TRIMETH/SULFA <=10 SENSITIVE Sensitive     CLINDAMYCIN <=0.25 SENSITIVE Sensitive     RIFAMPIN <=0.5 SENSITIVE Sensitive     Inducible Clindamycin NEGATIVE Sensitive     * ABUNDANT STAPHYLOCOCCUS AUREUS   Aerobic/Anaerobic Culture w Gram  Stain (surgical/deep wound)     Status: None   Collection Time: 09/08/20 11:45 AM   Specimen: PATH Other; Tissue  Result Value Ref Range Status   Specimen Description   Final    FOOT RIGHT Performed at [facility], [address], [place], [place] [address]    Special Requests   Final    NONE Performed at [facility], [address], [place], [place] [address]    Gram Stain   Final    FEW WBC PRESENT, PREDOMINANTLY PMN MODERATE GRAM POSITIVE COCCI IN PAIRS IN CLUSTERS RARE GRAM NEGATIVE RODS    Culture   Final    MODERATE STAPHYLOCOCCUS AUREUS RARE ENTEROCOCCUS FAECALIS RARE ENTEROBACTER CLOACAE NO ANAEROBES ISOLATED Performed at [facility], [address] [place], [place] [address]    Report Status 09/14/2020 FINAL  Final   Organism ID, Bacteria STAPHYLOCOCCUS AUREUS  Final   Organism ID, Bacteria ENTEROCOCCUS FAECALIS  Final   Organism ID, Bacteria ENTEROBACTER CLOACAE  Final      Susceptibility   Enterobacter cloacae - MIC*    CEFAZOLIN >=64 RESISTANT Resistant     CEFEPIME <=0.12 SENSITIVE Sensitive     CEFTAZIDIME <=1 SENSITIVE Sensitive     CIPROFLOXACIN 1 SENSITIVE Sensitive     GENTAMICIN <=1 SENSITIVE Sensitive     IMIPENEM 0.5 SENSITIVE Sensitive     TRIMETH/SULFA <=20 SENSITIVE Sensitive     PIP/TAZO <=4 SENSITIVE Sensitive     * RARE ENTEROBACTER CLOACAE   Enterococcus faecalis - MIC*    AMPICILLIN <=2 SENSITIVE Sensitive     VANCOMYCIN 1 SENSITIVE Sensitive     GENTAMICIN SYNERGY SENSITIVE Sensitive     * RARE ENTEROCOCCUS FAECALIS   Staphylococcus aureus - MIC*    CIPROFLOXACIN <=0.5 SENSITIVE Sensitive     ERYTHROMYCIN <=0.25 SENSITIVE Sensitive     GENTAMICIN <=0.5 SENSITIVE Sensitive     OXACILLIN <=0.25 SENSITIVE Sensitive     TETRACYCLINE <=1 SENSITIVE Sensitive     VANCOMYCIN <=0.5 SENSITIVE Sensitive     TRIMETH/SULFA <=10 SENSITIVE Sensitive     CLINDAMYCIN <=0.25 SENSITIVE Sensitive     RIFAMPIN  <=0.5 SENSITIVE Sensitive     Inducible Clindamycin NEGATIVE Sensitive     * MODERATE STAPHYLOCOCCUS AUREUS  Aerobic/Anaerobic Culture w Gram Stain (surgical/deep wound)     Status: None   Collection Time: 09/08/20 11:46 AM   Specimen: PATH Other; Tissue  Result Value Ref Range Status   Specimen Description   Final    FOOT LEFT Performed at [facility], [address] [place], [place] [address]    Special Requests   Final    NONE Performed at [facility], [address] [place], [place] [address]    Gram Stain   Final    RARE WBC PRESENT, PREDOMINANTLY PMN ABUNDANT GRAM POSITIVE COCCI IN CLUSTERS    Culture   Final    ABUNDANT ENTEROCOCCUS FAECALIS MODERATE STAPHYLOCOCCUS AUREUS WITHIN MIXED ORGANISMS NO ANAEROBES ISOLATED Performed at [facility], [address] [place], [place] [address]    Report Status 09/14/2020 FINAL  Final   Organism ID, Bacteria STAPHYLOCOCCUS AUREUS  Final   Organism ID, Bacteria ENTEROCOCCUS FAECALIS  Final      Susceptibility   Enterococcus faecalis - MIC*    AMPICILLIN <=2 SENSITIVE Sensitive     VANCOMYCIN 1 SENSITIVE Sensitive     GENTAMICIN SYNERGY SENSITIVE Sensitive     * ABUNDANT ENTEROCOCCUS FAECALIS   Staphylococcus aureus - MIC*  CIPROFLOXACIN <=0.5 SENSITIVE Sensitive     ERYTHROMYCIN <=0.25 SENSITIVE Sensitive     GENTAMICIN <=0.5 SENSITIVE Sensitive     OXACILLIN 0.5 SENSITIVE Sensitive     TETRACYCLINE <=1 SENSITIVE Sensitive     VANCOMYCIN <=0.5 SENSITIVE Sensitive     TRIMETH/SULFA <=10 SENSITIVE Sensitive     CLINDAMYCIN <=0.25 SENSITIVE Sensitive     RIFAMPIN <=0.5 SENSITIVE Sensitive     Inducible Clindamycin NEGATIVE Sensitive     * MODERATE STAPHYLOCOCCUS AUREUS  Aerobic/Anaerobic Culture w Gram Stain (surgical/deep wound)     Status: None (Preliminary result)   Collection Time: 09/10/20  8:25 AM   Specimen: Foot, Left; Tissue  Result Value Ref Range Status   Specimen Description TISSUE LEFT FOOT ULCER  SPEC A  Final   Special Requests LEFT FOOT ULCER  Final   Gram Stain   Final    RARE WBC PRESENT, PREDOMINANTLY PMN ABUNDANT GRAM POSITIVE COCCI RARE GRAM NEGATIVE RODS Performed at [facility] [address] [place], [place] [address]    Culture   Final    ABUNDANT STAPHYLOCOCCUS AUREUS FEW STENOTROPHOMONAS MALTOPHILIA RARE ENTEROBACTER CLOACAE NO ANAEROBES ISOLATED; CULTURE IN PROGRESS FOR 5 DAYS    Report Status PENDING  Incomplete   Organism ID, Bacteria STAPHYLOCOCCUS AUREUS  Final   Organism ID, Bacteria STENOTROPHOMONAS MALTOPHILIA  Final   Organism ID, Bacteria ENTEROBACTER CLOACAE  Final      Susceptibility   Enterobacter cloacae - MIC*    CEFAZOLIN >=64 RESISTANT Resistant     CEFEPIME <=0.12 SENSITIVE Sensitive     CEFTAZIDIME <=1 SENSITIVE Sensitive     CIPROFLOXACIN 1 SENSITIVE Sensitive     GENTAMICIN <=1 SENSITIVE Sensitive     IMIPENEM 0.5 SENSITIVE Sensitive     TRIMETH/SULFA <=20 SENSITIVE Sensitive     PIP/TAZO <=4 SENSITIVE Sensitive     * RARE ENTEROBACTER CLOACAE   Staphylococcus aureus - MIC*    CIPROFLOXACIN <=0.5 SENSITIVE Sensitive     ERYTHROMYCIN <=0.25 SENSITIVE Sensitive     GENTAMICIN <=0.5 SENSITIVE Sensitive     OXACILLIN 0.5 SENSITIVE Sensitive     TETRACYCLINE <=1 SENSITIVE Sensitive     VANCOMYCIN <=0.5 SENSITIVE Sensitive     TRIMETH/SULFA <=10 SENSITIVE Sensitive     CLINDAMYCIN <=0.25 SENSITIVE Sensitive     RIFAMPIN <=0.5 SENSITIVE Sensitive     Inducible Clindamycin NEGATIVE Sensitive     * ABUNDANT STAPHYLOCOCCUS AUREUS   Stenotrophomonas maltophilia - MIC*    LEVOFLOXACIN 0.5 SENSITIVE Sensitive     TRIMETH/SULFA <=20 SENSITIVE Sensitive     * FEW STENOTROPHOMONAS MALTOPHILIA  Culture, blood (routine x 2)     Status: None (Preliminary result)   Collection Time: 09/11/20  4:28 AM   Specimen: BLOOD LEFT HAND  Result Value Ref Range Status   Specimen Description BLOOD LEFT HAND  Final   Special Requests   Final     BOTTLES DRAWN AEROBIC ONLY Blood Culture results may not be optimal due to an inadequate volume of blood received in culture bottles   Culture   Final    NO GROWTH 4 DAYS Performed at [facility], [address] [place],  [address]    Report Status PENDING  Incomplete  Culture, blood (routine x 2)     Status: None (Preliminary result)   Collection Time: 09/11/20  4:37 AM   Specimen: BLOOD  Result Value Ref Range Status   Specimen Description BLOOD LEFT ANTECUBITAL  Final   Special  Requests   Final    BOTTLES DRAWN AEROBIC ONLY Blood Culture results may not be optimal due to an inadequate volume of blood received in culture bottles   Culture   Final    NO GROWTH 4 DAYS Performed at [facility], [place] [address] [place], [place] [address]    Report Status PENDING  Incomplete  Surgical pcr screen     Status: Abnormal   Collection Time: 09/12/20 12:44 PM   Specimen: Nasal Mucosa; Nasal Swab  Result Value Ref Range Status   MRSA, PCR NEGATIVE NEGATIVE Final   Staphylococcus aureus POSITIVE (A) NEGATIVE Final    Comment: (NOTE) The Xpert SA Assay (FDA approved for NASAL specimens in patients 98 years of age and older), is one component of a comprehensive surveillance program. It is not intended to diagnose infection nor to guide or monitor treatment. Performed at [facility], [place] [address], [place], [place] [address]     Radiology Studies: No results found.    Zellie Jenning T. Alder  If 7PM-7AM, please contact night-coverage [email or web address] 09/16/2020, 12:58 PM

## 2020-09-16 NOTE — TOC Progression Note (Signed)
Transition of Care [facility]) - Progression Note    Patient Details  Name: Michelle Skinner MRN: [id number] Date of Birth: 31-May-1966  Transition of Care [facility]) CM/SW [place], [place] Phone Number: 09/16/2020, 12:47 PM  Clinical Narrative:   CSW noting per chart review that patient is stable for DC, pending COVID test and wound VAC placement. CSW spoke with Admissions at [facility], they can admit tomorrow after COVID test results and VACs are in place. CSW to follow.    Expected Discharge Plan: [place] Barriers to Discharge: Continued Medical Work up  Expected Discharge Plan and Services Expected Discharge Plan: [place] arrangements for the past 2 months: Apartment                                       Social Determinants of Health (SDOH) Interventions    Readmission Risk Interventions No flowsheet data found.

## 2020-09-16 NOTE — Progress Notes (Signed)
Dressing to RLE reinforced. Dressing clean, dry and intact, no error noted on wound vac, target pressure at 114mmHg. Dr. Sharol Given updated.

## 2020-09-16 NOTE — Plan of Care (Signed)
  Problem: Education: Goal: Knowledge of General Education information will improve Description: Including pain rating scale, medication(s)/side effects and non-pharmacologic comfort measures Outcome: Progressing   Problem: Nutrition: Goal: Adequate nutrition will be maintained Outcome: Progressing   Problem: Coping: Goal: Level of anxiety will decrease Outcome: Progressing   

## 2020-09-16 NOTE — Progress Notes (Signed)
Patient ID: Michelle Skinner, female   DOB: 1965-12-28, 55 y.o.   MRN: [id number] Patient is resting comfortable this morning.  There is a leak around the wound VAC on the right, nursing reinforce the top of the dressing and there is a good suction seal at this time.  Patient is stable from orthopedic standpoint for discharge to skilled nursing.  I will place orders for the portable Praveena wound VAC x2 and will place an order for Covid testing.

## 2020-09-17 ENCOUNTER — Inpatient Hospital Stay: Payer: Self-pay

## 2020-09-17 DIAGNOSIS — Z89511 Acquired absence of right leg below knee: Secondary | ICD-10-CM | POA: Diagnosis not present

## 2020-09-17 DIAGNOSIS — E118 Type 2 diabetes mellitus with unspecified complications: Secondary | ICD-10-CM | POA: Diagnosis not present

## 2020-09-17 DIAGNOSIS — Z7401 Bed confinement status: Secondary | ICD-10-CM | POA: Diagnosis not present

## 2020-09-17 DIAGNOSIS — R61 Generalized hyperhidrosis: Secondary | ICD-10-CM | POA: Diagnosis not present

## 2020-09-17 DIAGNOSIS — I10 Essential (primary) hypertension: Secondary | ICD-10-CM | POA: Diagnosis not present

## 2020-09-17 DIAGNOSIS — M86071 Acute hematogenous osteomyelitis, right ankle and foot: Secondary | ICD-10-CM | POA: Diagnosis not present

## 2020-09-17 DIAGNOSIS — J811 Chronic pulmonary edema: Secondary | ICD-10-CM | POA: Diagnosis not present

## 2020-09-17 DIAGNOSIS — T82598A Other mechanical complication of other cardiac and vascular devices and implants, initial encounter: Secondary | ICD-10-CM | POA: Diagnosis not present

## 2020-09-17 DIAGNOSIS — R2689 Other abnormalities of gait and mobility: Secondary | ICD-10-CM | POA: Diagnosis not present

## 2020-09-17 DIAGNOSIS — M86172 Other acute osteomyelitis, left ankle and foot: Secondary | ICD-10-CM | POA: Diagnosis not present

## 2020-09-17 DIAGNOSIS — R279 Unspecified lack of coordination: Secondary | ICD-10-CM | POA: Diagnosis not present

## 2020-09-17 DIAGNOSIS — R5381 Other malaise: Secondary | ICD-10-CM | POA: Diagnosis not present

## 2020-09-17 DIAGNOSIS — R768 Other specified abnormal immunological findings in serum: Secondary | ICD-10-CM | POA: Diagnosis not present

## 2020-09-17 DIAGNOSIS — E162 Hypoglycemia, unspecified: Secondary | ICD-10-CM | POA: Diagnosis not present

## 2020-09-17 DIAGNOSIS — Z743 Need for continuous supervision: Secondary | ICD-10-CM | POA: Diagnosis not present

## 2020-09-17 DIAGNOSIS — Z794 Long term (current) use of insulin: Secondary | ICD-10-CM | POA: Diagnosis not present

## 2020-09-17 DIAGNOSIS — R7881 Bacteremia: Secondary | ICD-10-CM | POA: Diagnosis not present

## 2020-09-17 DIAGNOSIS — M86072 Acute hematogenous osteomyelitis, left ankle and foot: Secondary | ICD-10-CM | POA: Diagnosis not present

## 2020-09-17 DIAGNOSIS — E11649 Type 2 diabetes mellitus with hypoglycemia without coma: Secondary | ICD-10-CM | POA: Diagnosis not present

## 2020-09-17 DIAGNOSIS — A4101 Sepsis due to Methicillin susceptible Staphylococcus aureus: Secondary | ICD-10-CM | POA: Diagnosis not present

## 2020-09-17 DIAGNOSIS — L97513 Non-pressure chronic ulcer of other part of right foot with necrosis of muscle: Secondary | ICD-10-CM | POA: Diagnosis not present

## 2020-09-17 DIAGNOSIS — M6281 Muscle weakness (generalized): Secondary | ICD-10-CM | POA: Diagnosis not present

## 2020-09-17 DIAGNOSIS — M869 Osteomyelitis, unspecified: Secondary | ICD-10-CM | POA: Diagnosis not present

## 2020-09-17 DIAGNOSIS — F32A Depression, unspecified: Secondary | ICD-10-CM | POA: Diagnosis not present

## 2020-09-17 DIAGNOSIS — E119 Type 2 diabetes mellitus without complications: Secondary | ICD-10-CM | POA: Diagnosis not present

## 2020-09-17 DIAGNOSIS — M86171 Other acute osteomyelitis, right ankle and foot: Secondary | ICD-10-CM | POA: Diagnosis not present

## 2020-09-17 DIAGNOSIS — E1165 Type 2 diabetes mellitus with hyperglycemia: Secondary | ICD-10-CM | POA: Diagnosis not present

## 2020-09-17 DIAGNOSIS — E039 Hypothyroidism, unspecified: Secondary | ICD-10-CM | POA: Diagnosis not present

## 2020-09-17 DIAGNOSIS — L03115 Cellulitis of right lower limb: Secondary | ICD-10-CM | POA: Diagnosis not present

## 2020-09-17 DIAGNOSIS — M2550 Pain in unspecified joint: Secondary | ICD-10-CM | POA: Diagnosis not present

## 2020-09-17 DIAGNOSIS — R531 Weakness: Secondary | ICD-10-CM | POA: Diagnosis not present

## 2020-09-17 DIAGNOSIS — I2510 Atherosclerotic heart disease of native coronary artery without angina pectoris: Secondary | ICD-10-CM | POA: Diagnosis not present

## 2020-09-17 DIAGNOSIS — I890 Lymphedema, not elsewhere classified: Secondary | ICD-10-CM | POA: Diagnosis not present

## 2020-09-17 DIAGNOSIS — R52 Pain, unspecified: Secondary | ICD-10-CM | POA: Diagnosis not present

## 2020-09-17 DIAGNOSIS — Z79899 Other long term (current) drug therapy: Secondary | ICD-10-CM | POA: Diagnosis not present

## 2020-09-17 DIAGNOSIS — E43 Unspecified severe protein-calorie malnutrition: Secondary | ICD-10-CM | POA: Diagnosis not present

## 2020-09-17 DIAGNOSIS — Z91040 Latex allergy status: Secondary | ICD-10-CM | POA: Diagnosis not present

## 2020-09-17 DIAGNOSIS — B9561 Methicillin susceptible Staphylococcus aureus infection as the cause of diseases classified elsewhere: Secondary | ICD-10-CM | POA: Diagnosis not present

## 2020-09-17 DIAGNOSIS — I517 Cardiomegaly: Secondary | ICD-10-CM | POA: Diagnosis not present

## 2020-09-17 DIAGNOSIS — Z8541 Personal history of malignant neoplasm of cervix uteri: Secondary | ICD-10-CM | POA: Diagnosis not present

## 2020-09-17 DIAGNOSIS — D649 Anemia, unspecified: Secondary | ICD-10-CM | POA: Diagnosis not present

## 2020-09-17 DIAGNOSIS — Z89512 Acquired absence of left leg below knee: Secondary | ICD-10-CM | POA: Diagnosis not present

## 2020-09-17 LAB — RENAL FUNCTION PANEL
Albumin: <1 g/dL — ABNORMAL LOW (ref 3.5–5.0)
Anion gap: 3 — ABNORMAL LOW (ref 5–15)
BUN: 20 mg/dL (ref 6–20)
CO2: 28 mmol/L (ref 22–32)
Calcium: 7.6 mg/dL — ABNORMAL LOW (ref 8.9–10.3)
Chloride: 105 mmol/L (ref 98–111)
Creatinine, Ser: 0.85 mg/dL (ref 0.44–1.00)
GFR, Estimated: >60 mL/min (ref >60)
Glucose, Bld: 170 mg/dL — ABNORMAL HIGH (ref 70–99)
Phosphorus: 2.2 mg/dL — ABNORMAL LOW (ref 2.5–4.6)
Potassium: 4.8 mmol/L (ref 3.5–5.1)
Sodium: 136 mmol/L (ref 135–145)

## 2020-09-17 LAB — CBC
HCT: 25.7 % — ABNORMAL LOW (ref 36.0–46.0)
Hemoglobin: 8.2 g/dL — ABNORMAL LOW (ref 12.0–15.0)
MCH: 29.2 pg (ref 26.0–34.0)
MCHC: 31.9 g/dL (ref 30.0–36.0)
MCV: 91.5 fL (ref 80.0–100.0)
Platelets: 308 10*3/uL (ref 150–400)
RBC: 2.81 MIL/uL — ABNORMAL LOW (ref 3.87–5.11)
RDW: 15.5 % (ref 11.5–15.5)
WBC: 11.9 10*3/uL — ABNORMAL HIGH (ref 4.0–10.5)
nRBC: 0 % (ref 0.0–0.2)

## 2020-09-17 LAB — MAGNESIUM: Magnesium: 2.2 mg/dL (ref 1.7–2.4)

## 2020-09-17 LAB — GLUCOSE, CAPILLARY
Glucose-Capillary: 143 mg/dL — ABNORMAL HIGH (ref 70–99)
Glucose-Capillary: 125 mg/dL — ABNORMAL HIGH (ref 70–99)
Glucose-Capillary: 79 mg/dL (ref 70–99)

## 2020-09-17 MED ORDER — SENNOSIDES-DOCUSATE SODIUM 8.6-50 MG PO TABS: 1.0000 | ORAL_TABLET | Freq: Two times a day (BID) | ORAL | Status: DC | PRN

## 2020-09-17 MED ORDER — JUVEN PO PACK: 1.0000 | PACK | Freq: Two times a day (BID) | ORAL | 0 refills | Status: DC

## 2020-09-17 MED ORDER — INSULIN ASPART 100 UNIT/ML ~~LOC~~ SOLN: SUBCUTANEOUS | 11 refills | Status: DC

## 2020-09-17 MED ORDER — POLYETHYLENE GLYCOL 3350 17 G PO PACK: 17.0000 g | PACK | Freq: Two times a day (BID) | ORAL | 0 refills | Status: DC | PRN

## 2020-09-17 MED ORDER — INSULIN ASPART 100 UNIT/ML ~~LOC~~ SOLN: 12.0000 [IU] | Freq: Three times a day (TID) | SUBCUTANEOUS | 11 refills | Status: DC

## 2020-09-17 MED ORDER — CARVEDILOL 12.5 MG PO TABS: 12.5000 mg | ORAL_TABLET | Freq: Two times a day (BID) | ORAL | 11 refills | Status: AC

## 2020-09-17 MED ORDER — SODIUM CHLORIDE 0.9% FLUSH: 10.0000 mL | Freq: Two times a day (BID) | INTRAVENOUS | Status: DC

## 2020-09-17 MED ORDER — ENOXAPARIN SODIUM 80 MG/0.8ML ~~LOC~~ SOLN: 70.0000 mg | SUBCUTANEOUS | Status: DC

## 2020-09-17 MED ORDER — OXYCODONE HCL 10 MG PO TABS: 10.0000 mg | ORAL_TABLET | Freq: Four times a day (QID) | ORAL | 0 refills | Status: AC | PRN

## 2020-09-17 MED ORDER — METOPROLOL SUCCINATE ER 50 MG PO TB24: 50.0000 mg | ORAL_TABLET | Freq: Every day | ORAL | Status: DC

## 2020-09-17 MED ORDER — INSULIN ASPART 100 UNIT/ML ~~LOC~~ SOLN: 15.0000 [IU] | Freq: Three times a day (TID) | SUBCUTANEOUS | 11 refills | Status: DC

## 2020-09-17 MED ORDER — FOLIC ACID 1 MG PO TABS: 1.0000 mg | ORAL_TABLET | Freq: Every day | ORAL | Status: DC

## 2020-09-17 MED ORDER — ACETAMINOPHEN 500 MG PO TABS: 1000.0000 mg | ORAL_TABLET | Freq: Three times a day (TID) | ORAL | 0 refills | Status: AC

## 2020-09-17 MED ORDER — SODIUM CHLORIDE 0.9% FLUSH: 10.0000 mL | INTRAVENOUS | Status: DC | PRN

## 2020-09-17 MED ORDER — INSULIN GLARGINE 100 UNIT/ML ~~LOC~~ SOLN: 28.0000 [IU] | Freq: Two times a day (BID) | SUBCUTANEOUS | 11 refills | Status: DC

## 2020-09-17 MED ORDER — CEFAZOLIN IV (FOR PTA / DISCHARGE USE ONLY): 2.0000 g | Freq: Three times a day (TID) | INTRAVENOUS | 0 refills | Status: AC

## 2020-09-17 MED ORDER — MAGNESIUM CITRATE PO SOLN: 1.0000 | Freq: Every day | ORAL | Status: DC | PRN

## 2020-09-17 NOTE — Progress Notes (Signed)
Peripherally Inserted Central Catheter Placement  The IV Nurse has discussed with the patient and/or persons authorized to consent for the patient, the purpose of this procedure and the potential benefits and risks involved with this procedure.  The benefits include less needle sticks, lab draws from the catheter, and the patient may be discharged home with the catheter. Risks include, but not limited to, infection, bleeding, blood clot (thrombus formation), and puncture of an artery; nerve damage and irregular heartbeat and possibility to perform a PICC exchange if needed/ordered by physician.  Alternatives to this procedure were also discussed.  Bard Power PICC patient education guide, fact sheet on infection prevention and patient information card has been provided to patient /or left at bedside.    PICC Placement Documentation  PICC Single Lumen 09/17/20 PICC Right Cephalic 41 cm 0 cm (Active)  Indication for Insertion or Continuance of Line Home intravenous therapies (PICC only) 09/17/20 1015  Exposed Catheter (cm) 0 cm 09/17/20 1015  Site Assessment Clean;Dry;Intact 09/17/20 1015  Line Status Flushed;Saline locked;Blood return noted 09/17/20 1015  Dressing Type Transparent 09/17/20 1015  Dressing Status Clean;Dry;Intact 09/17/20 1015  Antimicrobial disc in place? Yes 09/17/20 1015  Safety Lock Not Applicable 09/81/19 1478  Line Care Connections checked and tightened;Tubing changed 09/17/20 1015  Line Adjustment (NICU/IV Team Only) No 09/17/20 1015  Dressing Intervention New dressing 09/17/20 1015  Dressing Change Due 09/24/20 09/17/20 1015       Michelle Skinner 09/17/2020, 10:15 AM

## 2020-09-17 NOTE — TOC Transition Note (Addendum)
Transition of Care [facility]) - CM/SW Discharge Note   Patient Details  Name: Michelle Skinner MRN: [id number] Date of Birth: [birth year]  [facility] CM/SW Contact:  Trula Ore, [place] Phone Number: 09/17/2020, 11:54 AM   Clinical Narrative:     Patient has SNF bed at [facility]. Insurance authorization has been approved. #[id number]. Next review date is 3/15.  Patient will DC to: [facility] date: 09/17/2020  Family notified: Shanon Brow   Transport by: Corey Harold  ?  Per MD patient ready for DC to [facility] . RN, patient, patient's family, and facility notified of DC. Discharge Summary sent to facility. Portable praveena wound vac to go with patient to facility.RN given number for report [id number]#[phone] RM#B-19 Bed 1. DC packet on chart. Ambulance transport requested for patient.  CSW signing off.    Final next level of care: Skilled Nursing Facility Barriers to Discharge: No Barriers Identified   Patient Goals and CMS Choice Patient states their goals for this hospitalization and ongoing recovery are:: to go to SNF CMS Medicare.gov Compare Post Acute Care list provided to:: Patient Choice offered to / list presented to : Patient  Discharge Placement              Patient chooses bed at:  [facility]) Patient to be transferred to facility by: [place] Name of family member notified: Shanon Brow Patient and family notified of of transfer: 09/17/20  Discharge Plan and Services                                     Social Determinants of Health (SDOH) Interventions     Readmission Risk Interventions No flowsheet data found.

## 2020-09-17 NOTE — Plan of Care (Signed)
  Problem: Activity: Goal: Risk for activity intolerance will decrease Outcome: Progressing   Problem: Coping: Goal: Level of anxiety will decrease Outcome: Progressing   Problem: Pain Managment: Goal: General experience of comfort will improve Outcome: Progressing   Problem: Safety: Goal: Ability to remain free from injury will improve Outcome: Progressing   Problem: Skin Integrity: Goal: Risk for impaired skin integrity will decrease Outcome: Progressing   

## 2020-09-17 NOTE — Discharge Summary (Signed)
Physician Discharge Summary  Michelle Skinner ZDG:[id number] DOB: Oct 27, 1965 DOA: 09/06/2020  PCP: Monico Blitz, MD  Admit date: 09/06/2020 Discharge date: 09/17/2020  Admitted From: Home Disposition: SNF  Recommendations for Outpatient Follow-up:  1. Follow ups as below. 2. Please obtain CBC with differential and BMP weekly 3. Please obtain CRP and ESR biweekly 4. Infectious disease to arrange outpatient follow-up 5. Outpatient follow-up with orthopedic surgery as below 6. Please follow up on the following pending results: None   Discharge Condition: Stable CODE STATUS: Full code   Contact information for follow-up providers    Persons, Bevely Palmer, PA In 1 week.   Specialty: Orthopedic Surgery Contact information: [place] [address] [phone]            Contact information for after-discharge care    [facility] .   Service: Skilled Nursing Contact information: [place] [employer] [phone]                   Hospital Course: 55 year old F with PMH of IDDM-2 with peripheral neuropathy, morbid obesity, HTN, hypothyroidism, CKD-3A, GERD and depression presented to [facility] with bilateral feet pain, progressive weakness.  She had initial debridement bilateral feet ulcer by Dr. Constance Haw at [facility], and subsequently transferred to [facility] for further surgical care.  Tissue culture with staph aureus, Enterococcus faecalis and Enterobacter closae  She underwent I&D of bilateral feet by Dr. Sharol Given on 09/10/2020 and noted to have circumferential abscess with osteomyelitis and cellulitis.  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  She also have MSSA bacteremia.  ID recommended IV Ancef through 10/09/2020.  Patient has postop ABLA requiring blood transfusions.  She has no obvious GI bleed. She received a total of 4 units.  Eventually, H&H stable.  Therapy  recommended SNF.  See individual problem list below for more on hospital course.   Discharge Diagnoses:  Sepsis due to MSSA bacteremia/BLE osteomyelitis/cellulitis in patient with chronic lymphedema/wound -S/p bilateral BKA with wound VAC placement on 09/13/2020 -TTE negative for vegetation.  ID did not feel TEE is needed -ID recommended 4 weeks of IV Ancef through 10/09/2020.  -Needs weekly CBC with differential and BMP, and biweekly CRP and ESR -Infectious disease to arrange outpatient follow-up -Oxycodone and Tylenol for pain control -Continue PT/OT  AKI on CKD-3A?  AKI resolved.  Creatinine back to normal.  Maybe she did not have CKD Recent Labs    09/06/20 1401 09/07/20 0622 09/07/20 1126 09/11/20 0428 09/12/20 0239 09/13/20 0514 09/14/20 0347 09/15/20 0325 09/16/20 0333 09/17/20 0340  BUN 52* 52* 51* 36* 30* 26* 31* 35* 26* 20  CREATININE 1.94* 1.81* 1.79* 1.47* 1.41* 1.31* 1.40* 1.43* 1.03* 0.85  -Avoid nephrotoxic meds -Weekly BMP as above  Acute postop blood loss anemia superimposed on anemia of renal disease: 4 units of blood so far. Anemia panel with folate deficiency. Recent Labs    09/07/20 0622 09/11/20 0428 09/12/20 0239 09/13/20 0514 09/13/20 1854 09/14/20 0347 09/15/20 0325 09/15/20 1500 09/16/20 0333 09/17/20 0340  HGB 8.8* 7.5* 7.6* 7.0* 9.6* 7.7* 6.6* 9.9* 8.6* 8.2*  -Received 1 unit after a Hgb of 6.6 and Hgb up to 9.9 which seems to be exaggerated -Continue p.o. ferrous sulfate and folic acid -Weekly CBC with differential  Acute urinary retention with bladder over distention: She had about a liter of UOP after Foley irrigation this morning-Foley was present on admission per patient  but no documentation. -Continue indwelling Foley at least for 10 days from 3/11 for bladder decompression -Voiding trial after 10 days (on 09/25/20).  If she fails, outpatient follow-up with urology  Uncontrolled IDDM-2 with hyperglycemia, hyperlipidemia,  neuropathy and CKD-3A: A1c 8.8%.  On high-dose Tresiba and short acting insulin at home. -Continue Lantus 28 units twice daily, NovoLog AC 12 units and SSI-resistant -Continue statin  Essential hypertension:  SBP in 150s. -Change Toprol-XL to Coreg for better blood pressure control -Reassess and adjust as appropriate.  Hypothyroidism -Continue home Synthroid  Hyperlipidemia -Continue statin  Anxiety and depression -Continue home meds  GERD -Continue Protonix  Debility/physical deconditioning -Continue PT/OT at SNF  Constipation: Resolved -As needed bowel regimen as below  Morbid obesity Body mass index is 50.35 kg/m. Nutrition Problem: Increased nutrient needs Etiology: post-op healing Signs/Symptoms: estimated needs Interventions: MVI,Premier Protein,Juven      Discharge Exam: Vitals:   09/17/20 0500 09/17/20 0736  BP: (!) 150/66 (!) 158/67  Pulse: 70 63  Resp: 16 17  Temp: 98.7 F (37.1 C) 98.2 F (36.8 C)  SpO2: 98% 99%    GENERAL: No apparent distress.  Nontoxic. HEENT: MMM.  Vision and hearing grossly intact.  NECK: Supple.  No apparent JVD.  RESP: On RA.  No IWOB.  Fair aeration bilaterally. CVS:  RRR. Heart sounds normal.  ABD/GI/GU: Bowel sounds present. Soft. Non tender.  MSK/EXT: B/L BKA with wound VAC. SKIN: no apparent skin lesion or wound NEURO: Awake, alert and oriented appropriately.  No apparent focal neuro deficit. PSYCH: Calm. Normal affect.   Discharge Instructions  Discharge Instructions    Change dressing on IV access line weekly and PRN   Complete by: As directed    Diet - low sodium heart healthy   Complete by: As directed    Diet Carb Modified   Complete by: As directed    Discharge wound care:   Complete by: As directed    Continue wound vac until follow up with orthopedic surgery   Flush IV access with Sodium Chloride 0.9% and Heparin 10 units/ml or 100 units/ml   Complete by: As directed    Increase activity  slowly   Complete by: As directed    Negative Pressure Wound Therapy - Incisional   Complete by: As directed      Allergies as of 09/17/2020      Reactions   Sulfa Antibiotics Diarrhea, Nausea And Vomiting, Rash   Clindamycin/lincomycin Diarrhea, Nausea And Vomiting   Invokana [canagliflozin] Diarrhea, Nausea And Vomiting   Latex    Rash, itching, over a long period of time   Tape Itching, Swelling   Reaction after a few days use/ please use paper tape   Bactrim [sulfamethoxazole-trimethoprim] Diarrhea, Nausea And Vomiting, Rash   Cherry Itching, Swelling, Rash   Throat swelling   Gabapentin Diarrhea, Nausea And Vomiting, Rash   Other Itching, Swelling, Rash   Reaction to Hot peppers (throat swelling)      Medication List    STOP taking these medications   DIUREX PO   HumuLIN R U-500 KwikPen 500 UNIT/ML kwikpen Generic drug: insulin regular human CONCENTRATED   metoprolol succinate 100 MG 24 hr tablet Commonly known as: TOPROL-XL   multivitamin with minerals Tabs tablet   Tresiba 100 UNIT/ML Soln Generic drug: Insulin Degludec   VITAMIN A PO   VITAMIN E PO     TAKE these medications   acetaminophen 500 MG tablet Commonly known as: TYLENOL Take 2 tablets (1,000 mg  total) by mouth every 8 (eight) hours for 10 days. What changed:   when to take this  reasons to take this   atorvastatin 40 MG tablet Commonly known as: LIPITOR Take 1 tablet (40 mg total) by mouth at bedtime.   carvedilol 12.5 MG tablet Commonly known as: Coreg Take 1 tablet (12.5 mg total) by mouth 2 (two) times daily.   ceFAZolin  IVPB Commonly known as: ANCEF Inject 2 g into the vein every 8 (eight) hours for 25 days. Indication:  MSSA Bacteremia  First Dose: No Last Day of Therapy:  10/09/2020 Labs - Once weekly:  CBC/D and BMP, Labs - Every other week:  ESR and CRP Method of administration: IV Push Method of administration may be changed at the discretion of home infusion pharmacist  based upon assessment of the patient and/or caregiver's ability to self-administer the medication ordered.   enoxaparin 80 MG/0.8ML injection Commonly known as: LOVENOX Inject 0.7 mLs (70 mg total) into the skin daily.   FLUoxetine 40 MG capsule Commonly known as: PROZAC Take 40 mg by mouth at bedtime.   folic acid 1 MG tablet Commonly known as: FOLVITE Take 1 tablet (1 mg total) by mouth daily.   FreeStyle Libre 2 Reader Kerrin Mo As directed   [employer] 2 Sensor Misc 1 Piece by Does not apply route every 14 (fourteen) days.   insulin aspart 100 UNIT/ML injection Commonly known as: novoLOG Hypoglycemia Standing Orders sidebar report CBG 70 - 120: 0 units CBG 121 - 150: 3 units CBG 151 - 200: 4 units CBG 201 - 250: 7 units CBG 251 - 300: 11 units CBG 301 - 350: 15 units CBG 351 - 400: 20 units CBG > 400: call MD and obtain STAT lab verification What changed:   how much to take  how to take this  when to take this  additional instructions   insulin aspart 100 UNIT/ML injection Commonly known as: novoLOG Inject 12 Units into the skin 3 (three) times daily with meals. What changed: You were already taking a medication with the same name, and this prescription was added. Make sure you understand how and when to take each.   insulin glargine 100 UNIT/ML injection Commonly known as: LANTUS Inject 0.28 mLs (28 Units total) into the skin 2 (two) times daily.   IRON PO Take 1 tablet by mouth at bedtime.   levothyroxine 150 MCG tablet Commonly known as: SYNTHROID Take 1 tablet (150 mcg total) by mouth daily before breakfast.   lisinopril 20 MG tablet Commonly known as: ZESTRIL Take 20 mg by mouth at bedtime.   magnesium citrate Soln Take 296 mLs (1 Bottle total) by mouth daily as needed for severe constipation.   neomycin-bacitracin-polymyxin ointment Commonly known as: NEOSPORIN Apply 1 application topically every 12 (twelve) hours. To legs   nutrition  supplement (JUVEN) Pack Take 1 packet by mouth 2 (two) times daily between meals.   Oxycodone HCl 10 MG Tabs Take 1 tablet (10 mg total) by mouth every 6 (six) hours as needed for up to 7 days for severe pain (pain score 7-10).   pantoprazole 40 MG tablet Commonly known as: PROTONIX TAKE ONE (1) TABLET EACH DAY What changed: See the new instructions.   polyethylene glycol 17 g packet Commonly known as: MIRALAX / GLYCOLAX Take 17 g by mouth 2 (two) times daily as needed for mild constipation.   PreserVision AREDS 2 Caps Take 1 capsule by mouth 2 (two) times daily.  senna-docusate 8.6-50 MG tablet Commonly known as: Senokot-S Take 1 tablet by mouth 2 (two) times daily as needed for moderate constipation.   VITAMIN B-1 PO Take 1 tablet by mouth daily.   VITAMIN C PO Take 1 tablet by mouth at bedtime.   Vitamin D3 125 MCG (5000 UT) Caps Take 1 capsule (5,000 Units total) by mouth daily.            Durable Medical Equipment  (From admission, onward)         Start     Ordered   09/11/20 0748  For home use only DME 3 n 1  Once        09/11/20 0748   09/11/20 0748  For home use only DME Walker rolling  Once       Question Answer Comment  Walker: With 5 Inch Wheels   Patient needs a walker to treat with the following condition Gait instability      09/11/20 0748   09/11/20 0748  For home use only DME standard manual wheelchair with seat cushion  Once       Comments: Patient suffers from bilateral lower extremity amputation which impairs their ability to perform daily activities like bathing, dressing, grooming, and toileting in the home.  A cane, crutch, or walker will not resolve issue with performing activities of daily living. A wheelchair will allow patient to safely perform daily activities. Patient can safely propel the wheelchair in the home or has a caregiver who can provide assistance. Length of need Lifetime. Accessories: elevating leg rests (ELRs), wheel locks,  extensions and anti-tippers.   09/11/20 0748           Discharge Care Instructions  (From admission, onward)         Start     Ordered   09/17/20 0000  Change dressing on IV access line weekly and PRN  (Home infusion instructions - [facility] )        09/17/20 0756   09/17/20 0000  Discharge wound care:       Comments: Continue wound vac until follow up with orthopedic surgery   09/17/20 0756          Consultations:  General surgery  Orthopedic surgery  Infectious disease   Procedures/Studies: 3/4-incision and drainage of bilateral feet 3/7-incision and drainage of bilateral feet 3/9-bilateral BKA with wound VAC placement    CT ANGIO AO+BIFEM W & OR WO CONTRAST  Result Date: 09/07/2020 CLINICAL DATA:  Bilateral lower extremity edema and foot ulcers EXAM: CT ANGIOGRAPHY OF ABDOMINAL AORTA WITH ILIOFEMORAL RUNOFF TECHNIQUE: Multidetector CT imaging of the abdomen, pelvis and lower extremities was performed using the standard protocol during bolus administration of intravenous contrast. Multiplanar CT image reconstructions and MIPs were obtained to evaluate the vascular anatomy. CONTRAST:  141m OMNIPAQUE IOHEXOL 350 MG/ML SOLN COMPARISON:  None. FINDINGS: VASCULAR Aorta: Normal caliber aorta without aneurysm, dissection, vasculitis or significant stenosis. Minimal trace atherosclerotic calcifications. Celiac: Patent without evidence of aneurysm, dissection, vasculitis or significant stenosis. SMA: Patent without evidence of aneurysm, dissection, vasculitis or significant stenosis. Renals: Both renal arteries are patent without evidence of aneurysm, dissection, vasculitis, fibromuscular dysplasia or significant stenosis. 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 IMA: Patent without evidence of aneurysm, dissection, vasculitis or significant stenosis. RIGHT Lower Extremity Inflow: Common, internal and external iliac arteries are patent without evidence of  aneurysm, dissection, vasculitis or significant stenosis. Outflow: Common, superficial and profunda femoral arteries and the popliteal artery are  patent without evidence of aneurysm, dissection, vasculitis or significant stenosis. Runoff: Patent three vessel runoff to the ankle. LEFT Lower Extremity Inflow: Common, internal and external iliac arteries are patent without evidence of aneurysm, dissection, vasculitis or significant stenosis. Outflow: Common, superficial and profunda femoral arteries and the popliteal artery are patent without evidence of aneurysm, dissection, vasculitis or significant stenosis. Runoff: Patent three vessel runoff to the ankle. Veins: Markedly enlarged bilateral superficial great saphenous veins with multiple collateral branches in the calves. Review of the MIP images confirms the above findings. NON-VASCULAR Lower chest: No acute abnormality. Hepatobiliary: Low attenuation of the hepatic parenchyma with subtle sparing around the gallbladder fossa consistent with steatosis. Pancreas: Unremarkable. No pancreatic ductal dilatation or surrounding inflammatory changes. Spleen: Normal in size without focal abnormality. Adrenals/Urinary Tract: Adrenal glands are unremarkable. Kidneys are normal, without renal calculi, focal lesion, or hydronephrosis. Bladder is unremarkable. Stomach/Bowel: Stomach is within normal limits. Appendix appears normal. No evidence of bowel wall thickening, distention, or inflammatory changes. Lymphatic: No suspicious lymphadenopathy. Mildly enlarged bilateral superficial inguinal lymph nodes likely reactive in nature. Reproductive: Status post hysterectomy. No adnexal masses. Other: No abdominal wall hernia or abnormality. No abdominopelvic ascites. Musculoskeletal: Extensive bilateral lower extremity edema beginning just below the knee on the left, and in the mid calf on the right. A fluid collection is present within internal dystrophic calcification in the medial  aspect of the left popliteal fossa consistent with a Baker's cyst. Subcutaneous emphysema present along the plantar aspect of the left foot consistent with reported ulcerations. IMPRESSION: VASCULAR 1. No evidence of hemodynamically significant arterial stenosis or occlusion. 2. Enlarged great saphenous veins bilaterally with multiple collaterals. Findings are highly suggestive of superficial venous insufficiency. This may be a contributing factor to the patient's lower extremity swelling and ulcerations. 3. Mild atherosclerotic plaque along the abdominal aorta. Aortic Atherosclerosis (ICD10-170.0) NON-VASCULAR 1. Hepatic steatosis. 2. Left-sided Baker's cyst. 3. Left greater than right lower extremity edema. 4. Subcutaneous emphysema along the plantar surface of the left foot consistent with the clinical history of soft tissue ulcerations. Signed, Criselda Peaches, MD, [facility] Radiology Electronically Signed   By: Jacqulynn Cadet M.D.   On: 09/07/2020 16:18   MR FOOT RIGHT WO CONTRAST  Result Date: 09/06/2020 CLINICAL DATA:  Wounds on her the that are leaking EXAM: MRI OF THE RIGHT FOREFOOT WITHOUT CONTRAST TECHNIQUE: Multiplanar, multisequence MR imaging of the right was performed. No intravenous contrast was administered. COMPARISON:  None. 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 There is also partially visualized osteoarthritis seen at the calcaneal cuboid joint with joint space loss and subchondral cystic changes. No areas of cortical destruction or periosteal reaction are seen. No large joint effusion is noted. There is a small amount of joint fluid seen between the first and second metatarsal heads. Ligaments The Lisfranc and collateral ligaments are intact. Muscles and Tendons Increased signal with diffuse fatty atrophy of the muscles are noted. The flexor and  extensor tendons are intact. A Soft tissues Scattered areas of superficial ulceration seen on the plantar and medial aspect of the forefoot. There is diffuse underlying subcutaneous edema and small amount of subcutaneous emphysema. On the plantar surface beneath the first metatarsal head there is an area of ulceration with question of a small possible sinus tract, series 22, image 20. IMPRESSION: Several areas of superficial ulceration with edema seen along the medial and plantar aspect of the forefoot  underneath the first and second metatarsals. There is 1 area with suggestion of a possible sinus tract/early abscess beneath the first metatarsal head. No definite evidence of osteomyelitis. Electronically Signed   By: Prudencio Pair M.D.   On: 09/06/2020 20:55   MR FOOT LEFT WO CONTRAST  Result Date: 09/06/2020 CLINICAL DATA:  Bleeding and leaking wounds on the plantar surface EXAM: MRI OF THE LEFT FOOT WITHOUT CONTRAST TECHNIQUE: Multiplanar, multisequence MR imaging of the left was performed. No intravenous contrast was administered. COMPARISON:  None. FINDINGS: The study is limited due to motion and technique and there is limited evaluation of the distal phalanges. Bones/Joint/Cartilage Within the visualized portions of the forefoot no areas of cortical destruction or periosteal reaction. There is mildly increased signal seen within the cuboid and navicular is well is the distal first phalanx without associated T1 hypointensity. Small joint effusion seen at the first MTP joint. Ligaments The Lisfranc and collateral ligaments are intact. Muscles and Tendons There is fatty atrophy with increased signal seen diffusely within the forefoot. The visualized portions of the flexor extensor tendons appear to be grossly intact. Soft tissues On the plantar surface beneath the third through fifth digits there areas of superficial ulceration and skin thickening. There is diffuse subcutaneous edema seen on the plantar surface  with subcutaneous emphysema. No loculated fluid collections are seen. There is also diffuse skin thickening and dorsal subcutaneous edema. IMPRESSION: Limited evaluation of the distal phalanges. Areas of superficial ulceration seen on the plantar surface of the forefoot with diffuse subcutaneous edema and findings of cellulitis. No definite loculated fluid collections or osteomyelitis. Electronically Signed   By: Prudencio Pair M.D.   On: 09/06/2020 20:22   DG Chest Port 1 View  Result Date: 09/06/2020 CLINICAL DATA:  Ulcerations to bilateral feet, lower extremity swelling, generalized weakness. EXAM: PORTABLE CHEST 1 VIEW COMPARISON:  Chest x-ray dated 11/15/2019 FINDINGS: Borderline cardiomegaly, stable. Lungs are clear. No pleural effusion or pneumothorax is seen. Osseous structures about the chest are unremarkable. IMPRESSION: 1. No active disease. No evidence of pneumonia or pulmonary edema. 2. Stable borderline cardiomegaly. Electronically Signed   By: Franki Cabot M.D.   On: 09/06/2020 15:20   DG Foot Complete Left  Result Date: 09/06/2020 CLINICAL DATA:  Severe ulcerations to bilateral feet. Lower extremity swelling, generalized weakness. EXAM: LEFT FOOT - COMPLETE 3+ VIEW COMPARISON:  None. FINDINGS: Deformities of the fourth and fifth proximal phalanx, most likely chronic based on configuration and superimposed callus formation. No acute appearing fracture line or displaced fracture fragment. No destructive changes elsewhere within the LEFT foot to suggest osteomyelitis. Patchy lucencies within the soft tissues along the volar aspect of the LEFT midfoot, predominantly at the level of the tarsals and metatarsals, suspicious for soft tissue gas, presumably related to overlying soft tissue ulceration. Chronic degenerative spurring within the midfoot and hindfoot, mild to moderate in degree, most prominent at the talar beak. IMPRESSION: 1. Evidence of soft tissue gas underlying the LEFT midfoot,  presumably related to overlying soft tissue ulceration, necrotizing fasciitis not excluded. 2. No acute appearing osseous abnormality. No destructive changes to confirm an associated osteomyelitis. 3. Chronic appearing fractures/deformities of the fourth and fifth proximal phalanx. Electronically Signed   By: Franki Cabot M.D.   On: 09/06/2020 15:31   DG Foot Complete Right  Result Date: 09/06/2020 CLINICAL DATA:  Weakness, severe ulcerations to bilateral feet, lower extremity swelling. EXAM: RIGHT FOOT COMPLETE - 3+ VIEW COMPARISON:  None. FINDINGS: Osseous alignment is normal.  No fracture line or displaced fracture fragment. No acute appearing cortical irregularity or osseous lesion. No destructive change is seen to confirm osteomyelitis. Degenerative spurring within the midfoot and hindfoot, mild to moderate in degree. Vascular calcifications. Presumed soft tissue edema. No evidence of soft tissue gas. IMPRESSION: 1. Presumed soft tissue edema. No evidence of soft tissue gas. 2. No acute appearing osseous abnormality. No evidence of osteomyelitis. Electronically Signed   By: Franki Cabot M.D.   On: 09/06/2020 15:22   ECHOCARDIOGRAM COMPLETE  Result Date: 09/10/2020    ECHOCARDIOGRAM REPORT   Patient Name:   KERLY RIGSBEE Date of Exam: 09/10/2020 Medical Rec #:  [id number]      Height:       67.5 in Accession #:    [id number]     Weight:       326.5 lb Date of Birth:  [birth year]     BSA:          2.504 m Patient Age:    34 years       BP:           128/57 mmHg Patient Gender: F              HR:           73 bpm. Exam Location:  Inpatient Procedure: 2D Echo, Cardiac Doppler and Color Doppler Indications:    Bacteremia R78.81  History:        Patient has no prior history of Echocardiogram examinations.                 Risk Factors:Diabetes and Dyslipidemia. Cancer. GERD.  Sonographer:    Jonelle Sidle Dance Referring Phys: [place]  1. Left ventricular ejection fraction, by estimation, is 55  to 60%. The left ventricle has normal function. The left ventricle has no regional wall motion abnormalities. There is mild concentric left ventricular hypertrophy. Left ventricular diastolic parameters were normal.  2. Right ventricular systolic function is normal. The right ventricular size is normal. Tricuspid regurgitation signal is inadequate for assessing PA pressure.  3. The mitral valve is normal in structure. Trivial mitral valve regurgitation. No evidence of mitral stenosis.  4. The aortic valve is grossly normal. There is mild calcification of the aortic valve. Aortic valve regurgitation is not visualized. No aortic stenosis is present.  5. The inferior vena cava is dilated in size with >50% respiratory variability, suggesting right atrial pressure of 8 mmHg. Comparison(s): No prior Echocardiogram. Conclusion(s)/Recommendation(s): Normal biventricular function without evidence of hemodynamically significant valvular heart disease. No evidence of valvular vegetations on this transthoracic echocardiogram. Would recommend a transesophageal echocardiogram to exclude infective endocarditis if clinically indicated. FINDINGS  Left Ventricle: Left ventricular ejection fraction, by estimation, is 55 to 60%. The left ventricle has normal function. The left ventricle has no regional wall motion abnormalities. The left ventricular internal cavity size was normal in size. There is  mild concentric left ventricular hypertrophy. Left ventricular diastolic parameters were normal. Right Ventricle: The right ventricular size is normal. No increase in right ventricular wall thickness. Right ventricular systolic function is normal. Tricuspid regurgitation signal is inadequate for assessing PA pressure. Left Atrium: Left atrial size was normal in size. Right Atrium: Right atrial size was normal in size. Pericardium: Trivial pericardial effusion is present. Mitral Valve: The mitral valve is normal in structure. Mild to  moderate mitral annular calcification. Trivial mitral valve regurgitation. No evidence of mitral valve stenosis. Tricuspid Valve: The tricuspid valve is  normal in structure. Tricuspid valve regurgitation is trivial. No evidence of tricuspid stenosis. Aortic Valve: The aortic valve is grossly normal. There is mild calcification of the aortic valve. Aortic valve regurgitation is not visualized. No aortic stenosis is present. Pulmonic Valve: The pulmonic valve was not well visualized. Pulmonic valve regurgitation is not visualized. Aorta: The aortic root, ascending aorta, aortic arch and descending aorta are all structurally normal, with no evidence of dilitation or obstruction. Venous: The inferior vena cava is dilated in size with greater than 50% respiratory variability, suggesting right atrial pressure of 8 mmHg. IAS/Shunts: The atrial septum is grossly normal.  LEFT VENTRICLE PLAX 2D LVIDd:         4.90 cm  Diastology LVIDs:         3.10 cm  LV e' medial:    6.64 cm/s LV PW:         1.20 cm  LV E/e' medial:  17.0 LV IVS:        1.25 cm  LV e' lateral:   11.20 cm/s LVOT diam:     1.70 cm  LV E/e' lateral: 10.1 LV SV:         61 LV SV Index:   24 LVOT Area:     2.27 cm  RIGHT VENTRICLE             IVC RV Basal diam:  2.90 cm     IVC diam: 2.90 cm RV S prime:     12.70 cm/s TAPSE (M-mode): 2.3 cm LEFT ATRIUM             Index       RIGHT ATRIUM           Index LA diam:        4.10 cm 1.64 cm/m  RA Area:     15.20 cm LA Vol (A2C):   72.9 ml 29.11 ml/m RA Volume:   38.30 ml  15.29 ml/m LA Vol (A4C):   64.3 ml 25.68 ml/m LA Biplane Vol: 70.8 ml 28.27 ml/m  AORTIC VALVE LVOT Vmax:   122.00 cm/s LVOT Vmean:  83.900 cm/s LVOT VTI:    0.268 m  AORTA Ao Root diam: 3.00 cm Ao Asc diam:  3.50 cm MITRAL VALVE MV Area (PHT): 2.83 cm     SHUNTS MV Decel Time: 268 msec     Systemic VTI:  0.27 m MV E velocity: 113.00 cm/s  Systemic Diam: 1.70 cm MV A velocity: 85.70 cm/s MV E/A ratio:  1.32 Buford Dresser MD  Electronically signed by Buford Dresser MD Signature Date/Time: 09/10/2020/4:13:43 PM    Final    [place] EKG SITE RITE  Result Date: 09/17/2020 If Site Rite image not attached, placement could not be confirmed due to current cardiac rhythm.      The results of significant diagnostics from this hospitalization (including imaging, microbiology, ancillary and laboratory) are listed below for reference.     Microbiology: Recent Results (from the past 240 hour(s))  Aerobic/Anaerobic Culture w Gram Stain (surgical/deep wound)     Status: None   Collection Time: 09/08/20 11:04 AM   Specimen: PATH Other; Tissue  Result Value Ref Range Status   Specimen Description   Final    FOOT RIGHT Performed at [facility], [address] [place], [place] [address]    Special Requests   Final    NONE Performed at [facility], [address] [place],  [address]    Gram Stain   Final  RARE WBC PRESENT, PREDOMINANTLY PMN MODERATE GRAM POSITIVE COCCI IN PAIRS IN CLUSTERS    Culture   Final    ABUNDANT STAPHYLOCOCCUS AUREUS MODERATE ENTEROCOCCUS FAECALIS NO ANAEROBES ISOLATED Performed at [facility], [address], [place], [place] [address]    Report Status 09/14/2020 FINAL  Final   Organism ID, Bacteria STAPHYLOCOCCUS AUREUS  Final   Organism ID, Bacteria ENTEROCOCCUS FAECALIS  Final      Susceptibility   Enterococcus faecalis - MIC*    AMPICILLIN <=2 SENSITIVE Sensitive     VANCOMYCIN 1 SENSITIVE Sensitive     GENTAMICIN SYNERGY SENSITIVE Sensitive     * MODERATE ENTEROCOCCUS FAECALIS   Staphylococcus aureus - MIC*    CIPROFLOXACIN <=0.5 SENSITIVE Sensitive     ERYTHROMYCIN <=0.25 SENSITIVE Sensitive     GENTAMICIN <=0.5 SENSITIVE Sensitive     OXACILLIN 0.5 SENSITIVE Sensitive     TETRACYCLINE <=1 SENSITIVE Sensitive     VANCOMYCIN <=0.5 SENSITIVE Sensitive     TRIMETH/SULFA <=10 SENSITIVE Sensitive     CLINDAMYCIN <=0.25 SENSITIVE Sensitive     RIFAMPIN <=0.5  SENSITIVE Sensitive     Inducible Clindamycin NEGATIVE Sensitive     * ABUNDANT STAPHYLOCOCCUS AUREUS  Aerobic/Anaerobic Culture w Gram Stain (surgical/deep wound)     Status: None   Collection Time: 09/08/20 11:45 AM   Specimen: PATH Other; Tissue  Result Value Ref Range Status   Specimen Description   Final    FOOT RIGHT Performed at [facility], [address], [place], [place] [address]    Special Requests   Final    NONE Performed at [facility], [address], [place], [place] [address]    Gram Stain   Final    FEW WBC PRESENT, PREDOMINANTLY PMN MODERATE GRAM POSITIVE COCCI IN PAIRS IN CLUSTERS RARE GRAM NEGATIVE RODS    Culture   Final    MODERATE STAPHYLOCOCCUS AUREUS RARE ENTEROCOCCUS FAECALIS RARE ENTEROBACTER CLOACAE NO ANAEROBES ISOLATED Performed at [facility], [address], [place], [place] [address]    Report Status 09/14/2020 FINAL  Final   Organism ID, Bacteria STAPHYLOCOCCUS AUREUS  Final   Organism ID, Bacteria ENTEROCOCCUS FAECALIS  Final   Organism ID, Bacteria ENTEROBACTER CLOACAE  Final      Susceptibility   Enterobacter cloacae - MIC*    CEFAZOLIN >=64 RESISTANT Resistant     CEFEPIME <=0.12 SENSITIVE Sensitive     CEFTAZIDIME <=1 SENSITIVE Sensitive     CIPROFLOXACIN 1 SENSITIVE Sensitive     GENTAMICIN <=1 SENSITIVE Sensitive     IMIPENEM 0.5 SENSITIVE Sensitive     TRIMETH/SULFA <=20 SENSITIVE Sensitive     PIP/TAZO <=4 SENSITIVE Sensitive     * RARE ENTEROBACTER CLOACAE   Enterococcus faecalis - MIC*    AMPICILLIN <=2 SENSITIVE Sensitive     VANCOMYCIN 1 SENSITIVE Sensitive     GENTAMICIN SYNERGY SENSITIVE Sensitive     * RARE ENTEROCOCCUS FAECALIS   Staphylococcus aureus - MIC*    CIPROFLOXACIN <=0.5 SENSITIVE Sensitive     ERYTHROMYCIN <=0.25 SENSITIVE Sensitive     GENTAMICIN <=0.5 SENSITIVE Sensitive     OXACILLIN <=0.25 SENSITIVE Sensitive     TETRACYCLINE <=1 SENSITIVE Sensitive     VANCOMYCIN <=0.5 SENSITIVE Sensitive      TRIMETH/SULFA <=10 SENSITIVE Sensitive     CLINDAMYCIN <=0.25 SENSITIVE Sensitive     RIFAMPIN <=0.5 SENSITIVE Sensitive     Inducible Clindamycin NEGATIVE Sensitive     * MODERATE STAPHYLOCOCCUS  AUREUS  Aerobic/Anaerobic Culture w Gram Stain (surgical/deep wound)     Status: None   Collection Time: 09/08/20 11:46 AM   Specimen: PATH Other; Tissue  Result Value Ref Range Status   Specimen Description   Final    FOOT LEFT Performed at [facility], [address] [place], [place] [address]    Special Requests   Final    NONE Performed at [facility], [address], [place], [place] [address]    Gram Stain   Final    RARE WBC PRESENT, PREDOMINANTLY PMN ABUNDANT GRAM POSITIVE COCCI IN CLUSTERS    Culture   Final    ABUNDANT ENTEROCOCCUS FAECALIS MODERATE STAPHYLOCOCCUS AUREUS WITHIN MIXED ORGANISMS NO ANAEROBES ISOLATED Performed at [facility], [place] [address] [place], [place] [address]    Report Status 09/14/2020 FINAL  Final   Organism ID, Bacteria STAPHYLOCOCCUS AUREUS  Final   Organism ID, Bacteria ENTEROCOCCUS FAECALIS  Final      Susceptibility   Enterococcus faecalis - MIC*    AMPICILLIN <=2 SENSITIVE Sensitive     VANCOMYCIN 1 SENSITIVE Sensitive     GENTAMICIN SYNERGY SENSITIVE Sensitive     * ABUNDANT ENTEROCOCCUS FAECALIS   Staphylococcus aureus - MIC*    CIPROFLOXACIN <=0.5 SENSITIVE Sensitive     ERYTHROMYCIN <=0.25 SENSITIVE Sensitive     GENTAMICIN <=0.5 SENSITIVE Sensitive     OXACILLIN 0.5 SENSITIVE Sensitive     TETRACYCLINE <=1 SENSITIVE Sensitive     VANCOMYCIN <=0.5 SENSITIVE Sensitive     TRIMETH/SULFA <=10 SENSITIVE Sensitive     CLINDAMYCIN <=0.25 SENSITIVE Sensitive     RIFAMPIN <=0.5 SENSITIVE Sensitive     Inducible Clindamycin NEGATIVE Sensitive     * MODERATE STAPHYLOCOCCUS AUREUS  Aerobic/Anaerobic Culture w Gram Stain (surgical/deep wound)     Status: None   Collection Time: 09/10/20  8:25 AM   Specimen: Foot, Left; Tissue   Result Value Ref Range Status   Specimen Description TISSUE LEFT FOOT ULCER SPEC A  Final   Special Requests LEFT FOOT ULCER  Final   Gram Stain   Final    RARE WBC PRESENT, PREDOMINANTLY PMN ABUNDANT GRAM POSITIVE COCCI RARE GRAM NEGATIVE RODS    Culture   Final    ABUNDANT STAPHYLOCOCCUS AUREUS FEW STENOTROPHOMONAS MALTOPHILIA RARE ENTEROBACTER CLOACAE NO ANAEROBES ISOLATED Performed at [facility], [address], [place], [place] [address]    Report Status 09/16/2020 FINAL  Final   Organism ID, Bacteria STAPHYLOCOCCUS AUREUS  Final   Organism ID, Bacteria STENOTROPHOMONAS MALTOPHILIA  Final   Organism ID, Bacteria ENTEROBACTER CLOACAE  Final      Susceptibility   Enterobacter cloacae - MIC*    CEFAZOLIN >=64 RESISTANT Resistant     CEFEPIME <=0.12 SENSITIVE Sensitive     CEFTAZIDIME <=1 SENSITIVE Sensitive     CIPROFLOXACIN 1 SENSITIVE Sensitive     GENTAMICIN <=1 SENSITIVE Sensitive     IMIPENEM 0.5 SENSITIVE Sensitive     TRIMETH/SULFA <=20 SENSITIVE Sensitive     PIP/TAZO <=4 SENSITIVE Sensitive     * RARE ENTEROBACTER CLOACAE   Staphylococcus aureus - MIC*    CIPROFLOXACIN <=0.5 SENSITIVE Sensitive     ERYTHROMYCIN <=0.25 SENSITIVE Sensitive     GENTAMICIN <=0.5 SENSITIVE Sensitive     OXACILLIN 0.5 SENSITIVE Sensitive     TETRACYCLINE <=1 SENSITIVE Sensitive     VANCOMYCIN <=0.5 SENSITIVE Sensitive     TRIMETH/SULFA <=10 SENSITIVE Sensitive     CLINDAMYCIN <=0.25  SENSITIVE Sensitive     RIFAMPIN <=0.5 SENSITIVE Sensitive     Inducible Clindamycin NEGATIVE Sensitive     * ABUNDANT STAPHYLOCOCCUS AUREUS   Stenotrophomonas maltophilia - MIC*    LEVOFLOXACIN 0.5 SENSITIVE Sensitive     TRIMETH/SULFA <=20 SENSITIVE Sensitive     * FEW STENOTROPHOMONAS MALTOPHILIA  Culture, blood (routine x 2)     Status: None   Collection Time: 09/11/20  4:28 AM   Specimen: BLOOD LEFT HAND  Result Value Ref Range Status   Specimen Description BLOOD LEFT HAND  Final    Special Requests   Final    BOTTLES DRAWN AEROBIC ONLY Blood Culture results may not be optimal due to an inadequate volume of blood received in culture bottles   Culture   Final    NO GROWTH 5 DAYS Performed at [facility], [place] [address], [place], [place] [address]    Report Status 09/16/2020 FINAL  Final  Culture, blood (routine x 2)     Status: None   Collection Time: 09/11/20  4:37 AM   Specimen: BLOOD  Result Value Ref Range Status   Specimen Description BLOOD LEFT ANTECUBITAL  Final   Special Requests   Final    BOTTLES DRAWN AEROBIC ONLY Blood Culture results may not be optimal due to an inadequate volume of blood received in culture bottles   Culture   Final    NO GROWTH 5 DAYS Performed at [facility], [place] [address] [place], [place] [address]    Report Status 09/16/2020 FINAL  Final  Surgical pcr screen     Status: Abnormal   Collection Time: 09/12/20 12:44 PM   Specimen: Nasal Mucosa; Nasal Swab  Result Value Ref Range Status   MRSA, PCR NEGATIVE NEGATIVE Final   Staphylococcus aureus POSITIVE (A) NEGATIVE Final    Comment: (NOTE) The Xpert SA Assay (FDA approved for NASAL specimens in patients 73 years of age and older), is one component of a comprehensive surveillance program. It is not intended to diagnose infection nor to guide or monitor treatment. Performed at [facility], [place] [address], [place], [place] [address]   SARS CORONAVIRUS 2 (TAT 6-24 HRS) Nasopharyngeal Nasopharyngeal Swab     Status: None   Collection Time: 09/16/20  3:00 PM   Specimen: Nasopharyngeal Swab  Result Value Ref Range Status   SARS Coronavirus 2 NEGATIVE NEGATIVE Final    Comment: (NOTE) SARS-CoV-2 target nucleic acids are NOT DETECTED.  The SARS-CoV-2 RNA is generally detectable in upper and lower respiratory specimens during the acute phase of infection. Negative results do not preclude SARS-CoV-2 infection, do not rule out co-infections with other  pathogens, and should not be used as the sole basis for treatment or other patient management decisions. Negative results must be combined with clinical observations, patient history, and epidemiological information. The expected result is Negative.  Fact Sheet for Patients: SugarRoll.be  Fact Sheet for Healthcare Providers: https://www.woods-mathews.com/  This test is not yet approved or cleared by the [place] FDA and  has been authorized for detection and/or diagnosis of SARS-CoV-2 by FDA under an Emergency Use Authorization (EUA). This EUA will remain  in effect (meaning this test can be used) for the duration of the COVID-19 declaration under Se ction 564(b)(1) of the Act, 21 U.S.C. section 360bbb-3(b)(1), unless the authorization is terminated or revoked sooner.  Performed at [facility], [place] [address] [place], [place] [address]  Labs:  CBC: Recent Labs  Lab 09/11/20 0428 09/12/20 0239 09/13/20 0514 09/13/20 1854 09/14/20 0347 09/15/20 0325 09/15/20 1500 09/16/20 0333 09/17/20 0340  WBC 15.9* 12.5* 10.6*  --  12.8* 11.4*  --  11.0* 11.9*  NEUTROABS 12.9* 9.7* 8.0*  --  11.1* 8.1*  --   --   --   HGB 7.5* 7.6* 7.0*   < > 7.7* 6.6* 9.9* 8.6* 8.2*  HCT 23.7* 23.7* 22.2*   < > 25.0* 21.6* 31.0* 26.2* 25.7*  MCV 87.5 88.4 89.5  --  91.6 91.9  --  90.0 91.5  PLT 289 296 301  --  334 348  --  326 308   < > = values in this interval not displayed.   BMP &GFR Recent Labs  Lab 09/13/20 0514 09/14/20 0347 09/15/20 0325 09/16/20 0333 09/17/20 0340  NA 134* 134* 136 137 136  K 4.1 5.7* 4.4 4.4 4.8  CL 104 103 106 105 105  CO2 '23 26 26 27 28  ' GLUCOSE 212* 437* 123* 165* 170*  BUN 26* 31* 35* 26* 20  CREATININE 1.31* 1.40* 1.43* 1.03* 0.85  CALCIUM 7.5* 7.4* 7.4* 7.7* 7.6*  MG 1.9 1.9 1.9 1.9 2.2  PHOS  --   --   --  2.5 2.2*   Estimated Creatinine Clearance: 117.1 mL/min (by C-G formula based on SCr of  0.85 mg/dL). Liver & Pancreas: Recent Labs  Lab 09/11/20 0428 09/12/20 0239 09/13/20 0514 09/14/20 0347 09/15/20 0325 09/16/20 0333 09/17/20 0340  AST 12* 12* 12* 19 18  --   --   ALT '5 6 6 6 5  ' --   --   ALKPHOS 99 95 106 76 74  --   --   BILITOT 0.7 0.6 0.4 0.1* 0.1*  --   --   PROT 5.8* 5.9* 6.0* 5.6* 5.3*  --   --   ALBUMIN <1.0* <1.0* <1.0* <1.0* <1.0* <1.0* <1.0*   No results for input(s): LIPASE, AMYLASE in the last 168 hours. No results for input(s): AMMONIA in the last 168 hours. Diabetic: Recent Labs    09/16/20 0333  HGBA1C 8.8*   Recent Labs  Lab 09/16/20 1117 09/16/20 1638 09/16/20 2230 09/17/20 0405 09/17/20 0758  GLUCAP 187* 105* 87 143* 125*   Cardiac Enzymes: No results for input(s): CKTOTAL, CKMB, CKMBINDEX, TROPONINI in the last 168 hours. No results for input(s): PROBNP in the last 8760 hours. Coagulation Profile: No results for input(s): INR, PROTIME in the last 168 hours. Thyroid Function Tests: No results for input(s): TSH, T4TOTAL, FREET4, T3FREE, THYROIDAB in the last 72 hours. Lipid Profile: No results for input(s): CHOL, HDL, LDLCALC, TRIG, CHOLHDL, LDLDIRECT in the last 72 hours. Anemia Panel: Recent Labs    09/16/20 0333  VITAMINB12 399  FOLATE 4.1*  FERRITIN 123  TIBC 244*  IRON 40  RETICCTPCT 3.0   Urine analysis:    Component Value Date/Time   COLORURINE YELLOW 09/06/2020 1401   APPEARANCEUR CLEAR 09/06/2020 1401   LABSPEC 1.017 09/06/2020 1401   PHURINE 5.0 09/06/2020 1401   GLUCOSEU >=500 (A) 09/06/2020 1401   HGBUR SMALL (A) 09/06/2020 1401   BILIRUBINUR NEGATIVE 09/06/2020 1401   KETONESUR 20 (A) 09/06/2020 1401   PROTEINUR 100 (A) 09/06/2020 1401   UROBILINOGEN 0.2 08/05/2008 1400   NITRITE NEGATIVE 09/06/2020 1401   LEUKOCYTESUR NEGATIVE 09/06/2020 1401   Sepsis Labs: Invalid input(s): PROCALCITONIN, LACTICIDVEN   Time coordinating discharge: 45 minutes  SIGNED:  Mercy Riding, MD  [facility] 09/17/2020, 11:02 AM  If 7PM-7AM, please contact night-coverage [email or web address]

## 2020-09-18 ENCOUNTER — Telehealth: Payer: Self-pay

## 2020-09-18 DIAGNOSIS — R52 Pain, unspecified: Secondary | ICD-10-CM | POA: Diagnosis not present

## 2020-09-18 DIAGNOSIS — Z89512 Acquired absence of left leg below knee: Secondary | ICD-10-CM | POA: Diagnosis not present

## 2020-09-18 DIAGNOSIS — Z89511 Acquired absence of right leg below knee: Secondary | ICD-10-CM | POA: Diagnosis not present

## 2020-09-18 LAB — SURGICAL PATHOLOGY

## 2020-09-18 NOTE — Telephone Encounter (Signed)
I called and sw WC nurse advised ok to remove the vac that is not working and apply a dry dressing and then she has an appt for follow up on Wednesday will remove the 2nd vac and send back to facility with orders. Will call with any other questions of note for IRB no drainage in either canister

## 2020-09-18 NOTE — Telephone Encounter (Signed)
Annette from [facility] wound nurse called she stated patient has on 2 wound vac and the left one is sucking and the right isn't sucking she has tried to find a way to fix the problem but wasn't able to she would like a call back regarding care instructions and what to do next call back:[phone]

## 2020-09-20 ENCOUNTER — Other Ambulatory Visit: Payer: Self-pay

## 2020-09-20 ENCOUNTER — Ambulatory Visit (INDEPENDENT_AMBULATORY_CARE_PROVIDER_SITE_OTHER): Payer: Medicare Other | Admitting: Physician Assistant

## 2020-09-20 ENCOUNTER — Encounter: Payer: Self-pay | Admitting: Physician Assistant

## 2020-09-20 DIAGNOSIS — Z89511 Acquired absence of right leg below knee: Secondary | ICD-10-CM

## 2020-09-20 DIAGNOSIS — S88119A Complete traumatic amputation at level between knee and ankle, unspecified lower leg, initial encounter: Secondary | ICD-10-CM

## 2020-09-20 DIAGNOSIS — Z89512 Acquired absence of left leg below knee: Secondary | ICD-10-CM

## 2020-09-20 NOTE — Progress Notes (Signed)
Office Visit Note   Patient: Michelle Skinner           Date of Birth: 1965-11-11           MRN: [id number] Visit Date: 09/20/2020              Requested by: Monico Blitz, MD [place],  [place] [address] PCP: Monico Blitz, MD  No chief complaint on file.     HPI: Patient is 1 week status post bilateral below-knee amputations.  She has been in a nursing facility.  Her wound VAC was leaking the day after she got to the nursing facility and was removed on the right.  She has a dry dressing in place she is on blood thinners  Assessment & Plan: Visit Diagnoses: No diagnosis found.  Plan: Begin daily Dial cleansing and application of compressive dressing such as an Ace wrap.  We will follow-up in 1 week.  Follow-Up Instructions: No follow-ups on file.   Ortho Exam  Patient is alert, oriented, no adenopathy, well-dressed, normal affect, normal respiratory effort. Examination bilateral below-knee amputation stumps well apposed wound edges without odor bloody has some bloody drainage no ascending cellulitis on either side.  She does have some erythema around the right amputation stump but no fluctuance no infective signs.  Again no ascending cellulitis  Imaging: No results found. No images are attached to the encounter.  Labs: Lab Results  Component Value Date   HGBA1C 8.8 (H) 09/16/2020   HGBA1C 10.1 (H) 07/03/2020   HGBA1C 10.0 (A) 03/06/2020   REPTSTATUS 09/16/2020 FINAL 09/11/2020   GRAMSTAIN  09/10/2020    RARE WBC PRESENT, PREDOMINANTLY PMN ABUNDANT GRAM POSITIVE COCCI RARE GRAM NEGATIVE RODS    CULT  09/11/2020    NO GROWTH 5 DAYS Performed at [facility], [place] [address], [place], [place] [address]    LABORGA STAPHYLOCOCCUS AUREUS 09/10/2020   LABORGA STENOTROPHOMONAS MALTOPHILIA 09/10/2020   [place] ENTEROBACTER CLOACAE 09/10/2020     Lab Results  Component Value Date   ALBUMIN <1.0 (L) 09/17/2020   ALBUMIN <1.0 (L) 09/16/2020   ALBUMIN <1.0 (L)  09/15/2020   PREALBUMIN 11.1 (L) 09/16/2020    Lab Results  Component Value Date   MG 2.2 09/17/2020   MG 1.9 09/16/2020   MG 1.9 09/15/2020   Lab Results  Component Value Date   VD25OH 12 (L) 10/20/2019   VD25OH 22 (L) 04/01/2019   VD25OH 29 (L) 08/17/2018    Lab Results  Component Value Date   PREALBUMIN 11.1 (L) 09/16/2020   CBC EXTENDED Latest Ref Rng & Units 09/17/2020 09/16/2020 09/16/2020  WBC 4.0 - 10.5 K/uL 11.9(H) 11.0(H) -  RBC 3.87 - 5.11 MIL/uL 2.81(L) 2.91(L) 2.95(L)  HGB 12.0 - 15.0 g/dL 8.2(L) 8.6(L) -  HCT 36.0 - 46.0 % 25.7(L) 26.2(L) -  PLT 150 - 400 K/uL 308 326 -  NEUTROABS 1.7 - 7.7 K/uL - - -  LYMPHSABS 0.7 - 4.0 K/uL - - -     There is no height or weight on file to calculate BMI.  Orders:  No orders of the defined types were placed in this encounter.  No orders of the defined types were placed in this encounter.    Procedures: No procedures performed  Clinical Data: No additional findings.  ROS:  All other systems negative, except as noted in the HPI. Review of Systems  Objective: Vital Signs: There were no vitals taken for this visit.  Specialty Comments:  No specialty comments available.  PMFS History: Patient Active Problem List   Diagnosis Date Noted  . MSSA bacteremia   . Acute hematogenous osteomyelitis of both feet ([place])   . Severe protein-calorie malnutrition ([place])   . BMI 50.0-59.9, adult ([place])   . Plantar ulcer of right foot ([place])   . Plantar ulcer of left foot ([place])   . Lymphedema   . Sepsis ([place]) 09/06/2020  . Cellulitis 04/27/2020  . AKI (acute kidney injury) ([place]) 04/27/2020  . Stasis dermatitis 04/27/2020  . Anxiety 03/31/2020  . Depression 03/31/2020  . GERD (gastroesophageal reflux disease) 03/31/2020  . Posttraumatic stress disorder 03/31/2020  . Personal history of noncompliance with medical treatment, presenting hazards to health 02/09/2018  . Abnormal CT of the abdomen 10/20/2017  . Dilated cbd,  acquired 10/20/2017  . Essential hypertension, benign 01/14/2017  . Primary hypothyroidism 05/03/2015  . Mixed hyperlipidemia 05/03/2015  . Uncontrolled type 2 diabetes mellitus with complication, with long-term current use of insulin ([place]) 04/25/2015  . Morbid (severe) obesity due to excess calories ([place]) 04/25/2015  . Vitamin D deficiency 04/25/2015  . Cervical cancer ([place]) 10/05/2012   Past Medical History:  Diagnosis Date  . Anemia   . Cervical cancer ([place])   . Depression   . Diabetes mellitus, type II ([place])   . GERD (gastroesophageal reflux disease)   . Hyperlipidemia   . Neuropathy   . PTSD (post-traumatic stress disorder)     Family History  Problem Relation Age of Onset  . Hypertension Mother   . Diabetes Father   . Hyperlipidemia Father   . CAD Father   . Stroke Father   . Osteoporosis Maternal Grandmother   . Cancer Maternal Grandmother   . Hypertension Maternal Grandmother   . Colon cancer Neg Hx     Past Surgical History:  Procedure Laterality Date  . ABDOMINAL HYSTERECTOMY    . AMPUTATION Bilateral 09/13/2020   Procedure: BILATERAL BELOW KNEE AMPUTATIONS;  Surgeon: [name], MD;  Location: [facility];  Service: Orthopedics;  Laterality: Bilateral;  . COLONOSCOPY WITH PROPOFOL N/A 05/25/2018   Procedure: COLONOSCOPY WITH PROPOFOL;  Surgeon: Daneil Dolin, MD;  [facility];  Service: Endoscopy;  Laterality: N/A;  7:30am  . I & D EXTREMITY Bilateral 09/10/2020   Procedure: IRRIGATION AND DEBRIDEMENT FEET;;  Surgeon: [name], MD;  Location: [facility];  Service: Orthopedics;  Laterality: Bilateral;  . IRRIGATION AND DEBRIDEMENT FOOT Bilateral 09/08/2020   Procedure: IRRIGATION AND DEBRIDEMENT FOOT;  Surgeon: Virl Cagey, MD;  Location: [facility];  Service: General;  Laterality: Bilateral;  left foot 11 x 10.5 x 1   . OTHER SURGICAL HISTORY Right    R Foot- I&D  . POLYPECTOMY  05/25/2018   Procedure: POLYPECTOMY;  Surgeon: Daneil Dolin, MD;   [facility];  Service: Endoscopy;;  colon  . UMBILICAL HERNIA REPAIR  2007   Social History   Occupational History  . Not on file  Tobacco Use  . Smoking status: Never Smoker  . Smokeless tobacco: Never Used  Vaping Use  . Vaping Use: Never used  Substance and Sexual Activity  . Alcohol use: No    Alcohol/week: 0.0 standard drinks  . Drug use: No  . Sexual activity: Yes    Birth control/protection: Surgical

## 2020-09-25 DIAGNOSIS — Z89512 Acquired absence of left leg below knee: Secondary | ICD-10-CM | POA: Diagnosis not present

## 2020-09-25 DIAGNOSIS — R52 Pain, unspecified: Secondary | ICD-10-CM | POA: Diagnosis not present

## 2020-09-25 DIAGNOSIS — Z89511 Acquired absence of right leg below knee: Secondary | ICD-10-CM | POA: Diagnosis not present

## 2020-09-26 DIAGNOSIS — R5381 Other malaise: Secondary | ICD-10-CM | POA: Diagnosis not present

## 2020-09-26 DIAGNOSIS — M869 Osteomyelitis, unspecified: Secondary | ICD-10-CM | POA: Diagnosis not present

## 2020-09-26 DIAGNOSIS — Z89512 Acquired absence of left leg below knee: Secondary | ICD-10-CM | POA: Diagnosis not present

## 2020-09-26 DIAGNOSIS — Z89511 Acquired absence of right leg below knee: Secondary | ICD-10-CM | POA: Diagnosis not present

## 2020-09-28 ENCOUNTER — Emergency Department (HOSPITAL_COMMUNITY): Payer: Medicare Other

## 2020-09-28 ENCOUNTER — Ambulatory Visit: Payer: Medicare Other | Admitting: Orthopedic Surgery

## 2020-09-28 ENCOUNTER — Emergency Department: Payer: Self-pay

## 2020-09-28 ENCOUNTER — Emergency Department (HOSPITAL_COMMUNITY)
Admission: EM | Admit: 2020-09-28 | Discharge: 2020-09-29 | Disposition: A | Discharge status: Home / Self Care | Payer: Medicare Other | Attending: Emergency Medicine | Admitting: Emergency Medicine

## 2020-09-28 DIAGNOSIS — E119 Type 2 diabetes mellitus without complications: Secondary | ICD-10-CM | POA: Insufficient documentation

## 2020-09-28 DIAGNOSIS — Z91040 Latex allergy status: Secondary | ICD-10-CM | POA: Insufficient documentation

## 2020-09-28 DIAGNOSIS — Z794 Long term (current) use of insulin: Secondary | ICD-10-CM | POA: Diagnosis not present

## 2020-09-28 DIAGNOSIS — E039 Hypothyroidism, unspecified: Secondary | ICD-10-CM | POA: Diagnosis not present

## 2020-09-28 DIAGNOSIS — R768 Other specified abnormal immunological findings in serum: Secondary | ICD-10-CM | POA: Diagnosis not present

## 2020-09-28 DIAGNOSIS — E11649 Type 2 diabetes mellitus with hypoglycemia without coma: Secondary | ICD-10-CM | POA: Diagnosis not present

## 2020-09-28 DIAGNOSIS — Z79899 Other long term (current) drug therapy: Secondary | ICD-10-CM | POA: Insufficient documentation

## 2020-09-28 DIAGNOSIS — I517 Cardiomegaly: Secondary | ICD-10-CM | POA: Diagnosis not present

## 2020-09-28 DIAGNOSIS — Z452 Encounter for adjustment and management of vascular access device: Secondary | ICD-10-CM

## 2020-09-28 DIAGNOSIS — Z8541 Personal history of malignant neoplasm of cervix uteri: Secondary | ICD-10-CM | POA: Insufficient documentation

## 2020-09-28 DIAGNOSIS — T82598A Other mechanical complication of other cardiac and vascular devices and implants, initial encounter: Secondary | ICD-10-CM | POA: Insufficient documentation

## 2020-09-28 DIAGNOSIS — E162 Hypoglycemia, unspecified: Secondary | ICD-10-CM | POA: Insufficient documentation

## 2020-09-28 DIAGNOSIS — J811 Chronic pulmonary edema: Secondary | ICD-10-CM | POA: Diagnosis not present

## 2020-09-28 LAB — BASIC METABOLIC PANEL
Anion gap: 8 (ref 5–15)
BUN: 15 mg/dL (ref 6–20)
CO2: 26 mmol/L (ref 22–32)
Calcium: 7.8 mg/dL — ABNORMAL LOW (ref 8.9–10.3)
Chloride: 102 mmol/L (ref 98–111)
Creatinine, Ser: 0.7 mg/dL (ref 0.44–1.00)
GFR, Estimated: >60 mL/min (ref >60)
Glucose, Bld: 127 mg/dL — ABNORMAL HIGH (ref 70–99)
Potassium: 3.3 mmol/L — ABNORMAL LOW (ref 3.5–5.1)
Sodium: 136 mmol/L (ref 135–145)

## 2020-09-28 LAB — LACTIC ACID, PLASMA: Lactic Acid, Venous: 0.8 mmol/L (ref 0.5–1.9)

## 2020-09-28 LAB — CBC WITH DIFFERENTIAL/PLATELET
Abs Immature Granulocytes: 0.01 10*3/uL (ref 0.00–0.07)
Basophils Absolute: 0 10*3/uL (ref 0.0–0.1)
Basophils Relative: 0 %
Eosinophils Absolute: 0 10*3/uL (ref 0.0–0.5)
Eosinophils Relative: 1 %
HCT: 25.4 % — ABNORMAL LOW (ref 36.0–46.0)
Hemoglobin: 7.9 g/dL — ABNORMAL LOW (ref 12.0–15.0)
Immature Granulocytes: 0 %
Lymphocytes Relative: 19 %
Lymphs Abs: 0.6 10*3/uL — ABNORMAL LOW (ref 0.7–4.0)
MCH: 29.5 pg (ref 26.0–34.0)
MCHC: 31.1 g/dL (ref 30.0–36.0)
MCV: 94.8 fL (ref 80.0–100.0)
Monocytes Absolute: 0.3 10*3/uL (ref 0.1–1.0)
Monocytes Relative: 9 %
Neutro Abs: 2.3 10*3/uL (ref 1.7–7.7)
Neutrophils Relative %: 71 %
Platelets: 220 10*3/uL (ref 150–400)
RBC: 2.68 MIL/uL — ABNORMAL LOW (ref 3.87–5.11)
RDW: 15.3 % (ref 11.5–15.5)
WBC: 3.2 10*3/uL — ABNORMAL LOW (ref 4.0–10.5)
nRBC: 0 % (ref 0.0–0.2)

## 2020-09-28 LAB — CBG MONITORING, ED
Glucose-Capillary: 103 mg/dL — ABNORMAL HIGH (ref 70–99)
Glucose-Capillary: 127 mg/dL — ABNORMAL HIGH (ref 70–99)
Glucose-Capillary: 133 mg/dL — ABNORMAL HIGH (ref 70–99)
Glucose-Capillary: 58 mg/dL — ABNORMAL LOW (ref 70–99)
Glucose-Capillary: 87 mg/dL (ref 70–99)

## 2020-09-28 LAB — URINALYSIS, ROUTINE W REFLEX MICROSCOPIC
Bilirubin Urine: NEGATIVE
Glucose, UA: NEGATIVE mg/dL
Hgb urine dipstick: NEGATIVE
Ketones, ur: 5 mg/dL — AB
Nitrite: NEGATIVE
Protein, ur: >=300 mg/dL — AB
Specific Gravity, Urine: 1.018 (ref 1.005–1.030)
pH: 5 (ref 5.0–8.0)

## 2020-09-28 MED ORDER — CHLORHEXIDINE GLUCONATE CLOTH 2 % EX PADS
6.0000 | MEDICATED_PAD | Freq: Every day | CUTANEOUS | Status: DC
  Administered 2020-09-28: 6 via TOPICAL

## 2020-09-28 MED ORDER — CEFAZOLIN SODIUM-DEXTROSE 2-4 GM/100ML-% IV SOLN
2.0000 g | Freq: Once | INTRAVENOUS | Status: AC
  Administered 2020-09-28: 2 g via INTRAVENOUS
  Filled 2020-09-28: qty 100

## 2020-09-28 MED ORDER — SODIUM CHLORIDE 0.9% FLUSH
10.0000 mL | Freq: Two times a day (BID) | INTRAVENOUS | Status: DC
  Administered 2020-09-28: 10 mL

## 2020-09-28 MED ORDER — DEXTROSE 50 % IV SOLN: 25.0000 mL | Freq: Once | INTRAVENOUS | Status: AC

## 2020-09-28 MED ORDER — SODIUM CHLORIDE 0.9% FLUSH
10.0000 mL | INTRAVENOUS | Status: DC | PRN
Start: 2020-09-28 — End: 2020-09-29

## 2020-09-28 MED ORDER — DEXTROSE 50 % IV SOLN
INTRAVENOUS | Status: AC
  Administered 2020-09-28: 25 mL via INTRAVENOUS
  Filled 2020-09-28: qty 50

## 2020-09-28 NOTE — ED Provider Notes (Signed)
[facility] EMERGENCY DEPARTMENT Provider Note   CSN: [id number] Arrival date & time: 09/28/20  4665     History Chief Complaint  Patient presents with  . Vascular Access Problem    Michelle Skinner is a 55 y.o. female.  Patient presents to the emergency department from nursing home.  Patient reports that staff pulled her PICC line out when they were changing her gown.  Due for antibiotics this morning, needs access.        Past Medical History:  Diagnosis Date  . Anemia   . Cervical cancer ([place])   . Depression   . Diabetes mellitus, type II ([place])   . GERD (gastroesophageal reflux disease)   . Hyperlipidemia   . Neuropathy   . PTSD (post-traumatic stress disorder)     Patient Active Problem List   Diagnosis Date Noted  . MSSA bacteremia   . Acute hematogenous osteomyelitis of both feet ([place])   . Severe protein-calorie malnutrition ([place])   . BMI 50.0-59.9, adult ([place])   . Plantar ulcer of right foot ([place])   . Plantar ulcer of left foot ([place])   . Lymphedema   . Sepsis ([place]) 09/06/2020  . Cellulitis 04/27/2020  . AKI (acute kidney injury) ([place]) 04/27/2020  . Stasis dermatitis 04/27/2020  . Anxiety 03/31/2020  . Depression 03/31/2020  . GERD (gastroesophageal reflux disease) 03/31/2020  . Posttraumatic stress disorder 03/31/2020  . Personal history of noncompliance with medical treatment, presenting hazards to health 02/09/2018  . Abnormal CT of the abdomen 10/20/2017  . Dilated cbd, acquired 10/20/2017  . Essential hypertension, benign 01/14/2017  . Primary hypothyroidism 05/03/2015  . Mixed hyperlipidemia 05/03/2015  . Uncontrolled type 2 diabetes mellitus with complication, with long-term current use of insulin ([place]) 04/25/2015  . Morbid (severe) obesity due to excess calories ([place]) 04/25/2015  . Vitamin D deficiency 04/25/2015  . Cervical cancer ([place]) 10/05/2012    Past Surgical History:  Procedure Laterality Date  . ABDOMINAL HYSTERECTOMY    .  AMPUTATION Bilateral 09/13/2020   Procedure: BILATERAL BELOW KNEE AMPUTATIONS;  Surgeon: [name], MD;  Location: [facility];  Service: Orthopedics;  Laterality: Bilateral;  . COLONOSCOPY WITH PROPOFOL N/A 05/25/2018   Procedure: COLONOSCOPY WITH PROPOFOL;  Surgeon: Daneil Dolin, MD;  [facility];  Service: Endoscopy;  Laterality: N/A;  7:30am  . I & D EXTREMITY Bilateral 09/10/2020   Procedure: IRRIGATION AND DEBRIDEMENT FEET;;  Surgeon: [name], MD;  Location: [facility];  Service: Orthopedics;  Laterality: Bilateral;  . IRRIGATION AND DEBRIDEMENT FOOT Bilateral 09/08/2020   Procedure: IRRIGATION AND DEBRIDEMENT FOOT;  Surgeon: Virl Cagey, MD;  Location[facility];  Service: General;  Laterality: Bilateral;  left foot 11 x 10.5 x 1   . OTHER SURGICAL HISTORY Right    R Foot- I&D  . POLYPECTOMY  05/25/2018   Procedure: POLYPECTOMY;  Surgeon: Daneil Dolin, MD;  [facility];  Service: Endoscopy;;  colon  . UMBILICAL HERNIA REPAIR  2007     OB History   No obstetric history on file.     Family History  Problem Relation Age of Onset  . Hypertension Mother   . Diabetes Father   . Hyperlipidemia Father   . CAD Father   . Stroke Father   . Osteoporosis Maternal Grandmother   . Cancer Maternal Grandmother   . Hypertension Maternal Grandmother   . Colon cancer Neg Hx     Social History   Tobacco  Use  . Smoking status: Never Smoker  . Smokeless tobacco: Never Used  Vaping Use  . Vaping Use: Never used  Substance Use Topics  . Alcohol use: No    Alcohol/week: 0.0 standard drinks  . Drug use: No    Home Medications Prior to Admission medications   Medication Sig Start Date End Date Taking? Authorizing Provider  Ascorbic Acid (VITAMIN C PO) Take 1 tablet by mouth at bedtime.    [provider]  atorvastatin (LIPITOR) 40 MG tablet Take 1 tablet (40 mg total) by mouth at bedtime. 07/12/20   Cassandria Anger, MD  carvedilol (COREG) 12.5  MG tablet Take 1 tablet (12.5 mg total) by mouth 2 (two) times daily. 09/17/20 09/17/21  Mercy Riding, MD  ceFAZolin (ANCEF) IVPB Inject 2 g into the vein every 8 (eight) hours for 25 days. Indication:  MSSA Bacteremia  First Dose: No Last Day of Therapy:  10/09/2020 Labs - Once weekly:  CBC/D and BMP, Labs - Every other week:  ESR and CRP Method of administration: IV Push Method of administration may be changed at the discretion of home infusion pharmacist based upon assessment of the patient and/or caregiver's ability to self-administer the medication ordered. 09/17/20 10/12/20  Mercy Riding, MD  Cholecalciferol (VITAMIN D3) 125 MCG (5000 UT) CAPS Take 1 capsule (5,000 Units total) by mouth daily. 05/13/18   Cassandria Anger, MD  Continuous Blood Gluc Receiver (FREESTYLE LIBRE 2 READER) DEVI As directed 03/06/20   Cassandria Anger, MD  Continuous Blood Gluc Sensor (FREESTYLE LIBRE 2 SENSOR) MISC 1 Piece by Does not apply route every 14 (fourteen) days. 03/06/20   Cassandria Anger, MD  enoxaparin (LOVENOX) 80 MG/0.8ML injection Inject 0.7 mLs (70 mg total) into the skin daily. 09/17/20 10/17/20  Mercy Riding, MD  Ferrous Sulfate (IRON PO) Take 1 tablet by mouth at bedtime.    [provider]  FLUoxetine (PROZAC) 40 MG capsule Take 40 mg by mouth at bedtime.    [provider]  folic acid (FOLVITE) 1 MG tablet Take 1 tablet (1 mg total) by mouth daily. 09/17/20   Mercy Riding, MD  insulin aspart (NOVOLOG) 100 UNIT/ML injection Hypoglycemia Standing Orders sidebar report CBG 70 - 120: 0 units CBG 121 - 150: 3 units CBG 151 - 200: 4 units CBG 201 - 250: 7 units CBG 251 - 300: 11 units CBG 301 - 350: 15 units CBG 351 - 400: 20 units CBG > 400: call MD and obtain STAT lab verification 09/17/20   Mercy Riding, MD  insulin aspart (NOVOLOG) 100 UNIT/ML injection Inject 12 Units into the skin 3 (three) times daily with meals. 09/17/20   Mercy Riding, MD  insulin glargine  (LANTUS) 100 UNIT/ML injection Inject 0.28 mLs (28 Units total) into the skin 2 (two) times daily. 09/17/20   Mercy Riding, MD  levothyroxine (SYNTHROID) 150 MCG tablet Take 1 tablet (150 mcg total) by mouth daily before breakfast. 07/12/20   [name], Marella Chimes, MD  lisinopril (ZESTRIL) 20 MG tablet Take 20 mg by mouth at bedtime. 06/29/20   [provider]  magnesium citrate SOLN Take 296 mLs (1 Bottle total) by mouth daily as needed for severe constipation. 09/17/20   Mercy Riding, MD  Multiple Vitamins-Minerals (PRESERVISION AREDS 2) CAPS Take 1 capsule by mouth 2 (two) times daily.    [provider]  neomycin-bacitracin-polymyxin (NEOSPORIN) ointment Apply 1 application topically every 12 (twelve) hours.  To legs    [provider]  nutrition supplement, JUVEN, (JUVEN) PACK Take 1 packet by mouth 2 (two) times daily between meals. 09/17/20   Mercy Riding, MD  pantoprazole (PROTONIX) 40 MG tablet TAKE ONE (1) TABLET EACH DAY Patient taking differently: Take 40 mg by mouth at bedtime. 12/01/18   Cassandria Anger, MD  polyethylene glycol (MIRALAX / GLYCOLAX) 17 g packet Take 17 g by mouth 2 (two) times daily as needed for mild constipation. 09/17/20   Mercy Riding, MD  senna-docusate (SENOKOT-S) 8.6-50 MG tablet Take 1 tablet by mouth 2 (two) times daily as needed for moderate constipation. 09/17/20   Mercy Riding, MD  Thiamine HCl (VITAMIN B-1 PO) Take 1 tablet by mouth daily.    [provider]    Allergies    Sulfa antibiotics, Clindamycin/lincomycin, Invokana [canagliflozin], Latex, Tape, Bactrim [sulfamethoxazole-trimethoprim], Cherry, Gabapentin, and Other  Review of Systems   Review of Systems  Respiratory: Negative for shortness of breath.   Skin: Negative for wound.  All other systems reviewed and are negative.   Physical Exam Updated Vital Signs BP (!) 170/62 (BP Location: Right Arm)   Pulse (!) 56   Temp (!) 96.1 F (35.6 C) (Axillary)    Resp 17   Ht 5' 7" (1.702 m)   Wt (!) 148.1 kg   SpO2 96%   BMI 51.14 kg/m   Physical Exam Vitals and nursing note reviewed.  Constitutional:      General: She is not in acute distress.    Appearance: Normal appearance. She is well-developed.  HENT:     Head: Normocephalic and atraumatic.     Right Ear: Hearing normal.     Left Ear: Hearing normal.     Nose: Nose normal.  Eyes:     Conjunctiva/sclera: Conjunctivae normal.     Pupils: Pupils are equal, round, and reactive to light.  Cardiovascular:     Rate and Rhythm: Regular rhythm.     Heart sounds: S1 normal and S2 normal. No murmur heard. No friction rub. No gallop.   Pulmonary:     Effort: Pulmonary effort is normal. No respiratory distress.     Breath sounds: Normal breath sounds.  Chest:     Chest wall: No tenderness.  Abdominal:     General: Bowel sounds are normal.     Palpations: Abdomen is soft.     Tenderness: There is no abdominal tenderness. There is no guarding or rebound. Negative signs include Murphy's sign and McBurney's sign.     Hernia: No hernia is present.  Musculoskeletal:        General: Normal range of motion.     Cervical back: Normal range of motion and neck supple.  Skin:    General: Skin is warm and dry.     Findings: No rash.  Neurological:     Mental Status: She is alert and oriented to person, place, and time.     GCS: GCS eye subscore is 4. GCS verbal subscore is 5. GCS motor subscore is 6.     Cranial Nerves: No cranial nerve deficit.     Sensory: No sensory deficit.     Coordination: Coordination normal.  Psychiatric:        Speech: Speech normal.        Behavior: Behavior normal.        Thought Content: Thought content normal.     ED Results / Procedures / Treatments   Labs (all  labs ordered are listed, but only abnormal results are displayed) Labs Reviewed  CBC WITH DIFFERENTIAL/PLATELET - Abnormal; Notable for the following components:      Result Value   WBC 3.2 (*)     RBC 2.68 (*)    Hemoglobin 7.9 (*)    HCT 25.4 (*)    Lymphs Abs 0.6 (*)    All other components within normal limits  BASIC METABOLIC PANEL - Abnormal; Notable for the following components:   Potassium 3.3 (*)    Glucose, Bld 127 (*)    Calcium 7.8 (*)    All other components within normal limits  CBG MONITORING, ED - Abnormal; Notable for the following components:   Glucose-Capillary 58 (*)    All other components within normal limits  CBG MONITORING, ED - Abnormal; Notable for the following components:   Glucose-Capillary 133 (*)    All other components within normal limits  CULTURE, BLOOD (SINGLE)  URINE CULTURE  LACTIC ACID, PLASMA  URINALYSIS, ROUTINE W REFLEX MICROSCOPIC    EKG None  Radiology DG Chest Port 1 View  Result Date: 09/28/2020 CLINICAL DATA:  Patient accidentally pulled central line EXAM: PORTABLE CHEST 1 VIEW COMPARISON:  Radiograph 09/06/2020 FINDINGS: Borderline cardiomegaly is similar to comparison. Pulmonary vascular congestion. Few peripheral septal lines and some mild fissural thickening, could reflect early interstitial edematous change. No consolidative or confluent opacity. No pneumothorax or layering effusion. No acute osseous or soft tissue abnormality. Degenerative changes are present in the imaged spine and shoulders. IMPRESSION: Borderline cardiomegaly with pulmonary vascular congestion and suggestion of early interstitial edematous change. Electronically Signed   By: Lovena Le M.D.   On: 09/28/2020 04:40    Procedures Procedures   Medications Ordered in ED Medications  dextrose 50 % solution 25 mL (25 mLs Intravenous Given 09/28/20 0431)    ED Course  I have reviewed the triage vital signs and the nursing notes.  Pertinent labs & imaging results that were available during my care of the patient were reviewed by me and considered in my medical decision making (see chart for details).    MDM Rules/Calculators/A&P                           Patient sent from nursing home because she lost her vascular access.  Patient receiving IV antibiotics through a PICC line currently.  Patient tells me that the PICC line was inadvertently pulled out when staff was changing her clothes.  Patient reports that she had become very sweaty.  I suspect this was secondary to hypoglycemia.  Patient's blood sugar was low at arrival and she was hypothermic.  After reviewing her chart, I did feel it was necessary to perform some work-up on her to make sure that there was not evidence of worsening infection or sepsis.  She has a normal lactic acid.  Lab work was reassuring.  Vital signs improving after she was rewarmed.  I do believe patient is appropriate for return to the nursing home but will explore possibility of replacing PICC line prior to discharge.  Final Clinical Impression(s) / ED Diagnoses Final diagnoses:  Hypoglycemia    Rx / DC Orders ED Discharge Orders    None       Pellegrino Kennard, Gwenyth Allegra, MD 09/28/20 [phone]

## 2020-09-28 NOTE — ED Triage Notes (Signed)
Pt here [facility]. picc line came out. facility wants it replaced.

## 2020-09-28 NOTE — ED Notes (Addendum)
Pt soiled in urine and stool upon arrival from [facility]. Pt cleaned, new linens provided. Pt repositioned in bed for comfort. Purewick applied.  Bear hugger placed on pt d/t low temp.  Pt denied any other needs at this time. Call bell within reach, bed in low position. Will continue to monitor.

## 2020-09-28 NOTE — ED Provider Notes (Signed)
PICC is in.  Ancef 2 grams ordered for infusion prior to d/c.    Varney Biles, MD 09/28/20 [phone]

## 2020-09-28 NOTE — ED Notes (Signed)
PICC Nurse with pt

## 2020-09-28 NOTE — ED Notes (Signed)
Report called to Shanon Brow at [facility]

## 2020-09-28 NOTE — Progress Notes (Signed)
Peripherally Inserted Central Catheter Placement  The IV Nurse has discussed with the patient and/or persons authorized to consent for the patient, the purpose of this procedure and the potential benefits and risks involved with this procedure.  The benefits include less needle sticks, lab draws from the catheter, and the patient may be discharged home with the catheter. Risks include, but not limited to, infection, bleeding, blood clot (thrombus formation), and puncture of an artery; nerve damage and irregular heartbeat and possibility to perform a PICC exchange if needed/ordered by physician.  Alternatives to this procedure were also discussed.  Bard Power PICC patient education guide, fact sheet on infection prevention and patient information card has been provided to patient /or left at bedside.    PICC Placement Documentation  PICC Single Lumen 19/01/22 PICC Right Basilic 39 cm 0 cm (Active)  Indication for Insertion or Continuance of Line Home intravenous therapies (PICC only) 09/28/20 1700  Exposed Catheter (cm) 0 cm 09/28/20 1700  Site Assessment Clean;Dry;Intact 09/28/20 1700  Line Status Flushed;Blood return noted 09/28/20 1700  Dressing Type Transparent 09/28/20 1700  Dressing Status Clean;Dry;Intact 09/28/20 1700  Antimicrobial disc in place? Yes 09/28/20 1700  Dressing Intervention New dressing 09/28/20 1700  Dressing Change Due 10/05/20 09/28/20 1700       Jule Economy Horton 09/28/2020, 5:36 PM

## 2020-09-28 NOTE — ED Notes (Addendum)
Pt removed from bear hugger at this time. Additional blanket provided for pt comfort.

## 2020-09-28 NOTE — Discharge Instructions (Addendum)
PICC line replaced. She received 1 round of ancef prior to being discharged.

## 2020-09-28 NOTE — ED Provider Notes (Signed)
Patient had her PICC line accidentally removed.  We have made arrangements for her to get a new PICC line today   Milton Ferguson, MD 09/28/20 1427

## 2020-09-29 DIAGNOSIS — Z89511 Acquired absence of right leg below knee: Secondary | ICD-10-CM | POA: Diagnosis not present

## 2020-09-29 DIAGNOSIS — E559 Vitamin D deficiency, unspecified: Secondary | ICD-10-CM | POA: Diagnosis not present

## 2020-09-29 DIAGNOSIS — R5381 Other malaise: Secondary | ICD-10-CM | POA: Diagnosis not present

## 2020-09-29 DIAGNOSIS — D649 Anemia, unspecified: Secondary | ICD-10-CM | POA: Diagnosis not present

## 2020-09-29 DIAGNOSIS — E782 Mixed hyperlipidemia: Secondary | ICD-10-CM | POA: Diagnosis not present

## 2020-09-29 DIAGNOSIS — R2681 Unsteadiness on feet: Secondary | ICD-10-CM | POA: Diagnosis not present

## 2020-09-29 DIAGNOSIS — F32A Depression, unspecified: Secondary | ICD-10-CM | POA: Diagnosis not present

## 2020-09-29 DIAGNOSIS — Z89512 Acquired absence of left leg below knee: Secondary | ICD-10-CM | POA: Diagnosis not present

## 2020-09-29 DIAGNOSIS — M86172 Other acute osteomyelitis, left ankle and foot: Secondary | ICD-10-CM | POA: Diagnosis not present

## 2020-09-29 DIAGNOSIS — Z79899 Other long term (current) drug therapy: Secondary | ICD-10-CM | POA: Diagnosis not present

## 2020-09-29 DIAGNOSIS — E039 Hypothyroidism, unspecified: Secondary | ICD-10-CM | POA: Diagnosis not present

## 2020-09-29 DIAGNOSIS — M86171 Other acute osteomyelitis, right ankle and foot: Secondary | ICD-10-CM | POA: Diagnosis not present

## 2020-09-29 DIAGNOSIS — L03115 Cellulitis of right lower limb: Secondary | ICD-10-CM | POA: Diagnosis not present

## 2020-09-29 DIAGNOSIS — Z7401 Bed confinement status: Secondary | ICD-10-CM | POA: Diagnosis not present

## 2020-09-29 DIAGNOSIS — I2510 Atherosclerotic heart disease of native coronary artery without angina pectoris: Secondary | ICD-10-CM | POA: Diagnosis not present

## 2020-09-29 DIAGNOSIS — Z794 Long term (current) use of insulin: Secondary | ICD-10-CM | POA: Diagnosis not present

## 2020-09-29 DIAGNOSIS — E785 Hyperlipidemia, unspecified: Secondary | ICD-10-CM | POA: Diagnosis not present

## 2020-09-29 DIAGNOSIS — M86072 Acute hematogenous osteomyelitis, left ankle and foot: Secondary | ICD-10-CM | POA: Diagnosis not present

## 2020-09-29 DIAGNOSIS — M6281 Muscle weakness (generalized): Secondary | ICD-10-CM | POA: Diagnosis not present

## 2020-09-29 DIAGNOSIS — B9561 Methicillin susceptible Staphylococcus aureus infection as the cause of diseases classified elsewhere: Secondary | ICD-10-CM | POA: Diagnosis not present

## 2020-09-29 DIAGNOSIS — Z452 Encounter for adjustment and management of vascular access device: Secondary | ICD-10-CM | POA: Diagnosis not present

## 2020-09-29 DIAGNOSIS — M86071 Acute hematogenous osteomyelitis, right ankle and foot: Secondary | ICD-10-CM | POA: Diagnosis not present

## 2020-09-29 DIAGNOSIS — M869 Osteomyelitis, unspecified: Secondary | ICD-10-CM | POA: Diagnosis not present

## 2020-09-29 DIAGNOSIS — E118 Type 2 diabetes mellitus with unspecified complications: Secondary | ICD-10-CM | POA: Diagnosis not present

## 2020-09-29 DIAGNOSIS — E119 Type 2 diabetes mellitus without complications: Secondary | ICD-10-CM | POA: Diagnosis not present

## 2020-09-29 DIAGNOSIS — R7881 Bacteremia: Secondary | ICD-10-CM | POA: Diagnosis not present

## 2020-09-29 DIAGNOSIS — R2689 Other abnormalities of gait and mobility: Secondary | ICD-10-CM | POA: Diagnosis not present

## 2020-09-29 DIAGNOSIS — R7989 Other specified abnormal findings of blood chemistry: Secondary | ICD-10-CM | POA: Diagnosis not present

## 2020-09-29 DIAGNOSIS — R279 Unspecified lack of coordination: Secondary | ICD-10-CM | POA: Diagnosis not present

## 2020-09-29 DIAGNOSIS — R262 Difficulty in walking, not elsewhere classified: Secondary | ICD-10-CM | POA: Diagnosis not present

## 2020-09-29 DIAGNOSIS — L309 Dermatitis, unspecified: Secondary | ICD-10-CM | POA: Diagnosis not present

## 2020-09-29 DIAGNOSIS — E1165 Type 2 diabetes mellitus with hyperglycemia: Secondary | ICD-10-CM | POA: Diagnosis not present

## 2020-09-29 DIAGNOSIS — L03119 Cellulitis of unspecified part of limb: Secondary | ICD-10-CM | POA: Diagnosis not present

## 2020-09-29 DIAGNOSIS — I10 Essential (primary) hypertension: Secondary | ICD-10-CM | POA: Diagnosis not present

## 2020-09-29 NOTE — ED Notes (Signed)
Pt resting quietly in bed, reports chronic back pain, offered to turn or reposition patient for comfort, pt denies. purewick remains in place, pt and linens dry. Pt denies food or drink at this time, denies further needs. Call bell within reach, bed locked and low. Will continue to monitor.

## 2020-09-29 NOTE — ED Provider Notes (Signed)
I was informed by the lab that the patient has had a single positive blood culture - there is no speciation yet - pt had been given ancef yesterday and is continuing to get at the [facility] where they are staying - the pt is feeling well - has no hypotension, fever or tachycardia - they were encouraged to follow results with their providers and to send Ms. Streets back to the ED if symptoms worsened - she does not appear septic according to their report.   Noemi Chapel, MD 09/29/20 2149

## 2020-10-01 LAB — URINE CULTURE: Culture: >=100000 — AB

## 2020-10-02 ENCOUNTER — Encounter (HOSPITAL_COMMUNITY): Payer: Self-pay | Admitting: Physical Therapy

## 2020-10-02 ENCOUNTER — Encounter: Payer: Self-pay | Admitting: Orthopedic Surgery

## 2020-10-02 ENCOUNTER — Ambulatory Visit (INDEPENDENT_AMBULATORY_CARE_PROVIDER_SITE_OTHER): Payer: Medicare Other | Admitting: Physician Assistant

## 2020-10-02 DIAGNOSIS — Z89512 Acquired absence of left leg below knee: Secondary | ICD-10-CM

## 2020-10-02 DIAGNOSIS — S88119A Complete traumatic amputation at level between knee and ankle, unspecified lower leg, initial encounter: Secondary | ICD-10-CM

## 2020-10-02 LAB — CULTURE, BLOOD (SINGLE): Special Requests: ADEQUATE

## 2020-10-02 NOTE — Progress Notes (Signed)
Office Visit Note   Patient: Michelle Skinner           Date of Birth: [birth year]           MRN: [id number] Visit Date: 10/02/2020              Requested by: Monico Blitz, MD [address] [place],  [place] [address] PCP: Monico Blitz, MD  Chief Complaint  Patient presents with  . Right Leg - Routine Post Op    09/13/20 bilateral BKA   . Left Leg - Routine Post Op      HPI: Patient is 3 weeks status post bilateral below-knee amputations.  She is currently on IV antibiotics 3 times daily for blood infection.  She has been much more pain on the right than left  Assessment & Plan: Visit Diagnoses: No diagnosis found.  Plan: She should get be getting 30 g of protein twice daily as well as 5000 units of D3 and 2000 units of vitamin C.  She should be elevating her legs and they should be washed daily with Dial soap and water with clean dry dressing applied will follow up in 1 week  Follow-Up Instructions: No follow-ups on file.   Ortho Exam  Patient is alert, oriented, no adenopathy, well-dressed, normal affect, normal respiratory effort. Right below-knee amputation stump on the lateral there is an area of wound dehiscence with some ischemia though there are some healthy tissue around the ischemic areas.  She has some weight dependent discoloration at the end of the stump.  On the left well apposed wound edges without any significant wound dehiscence.  HEENT she does have some weepy drainage bilaterally.  No ascending cellulitis on either side  Imaging: No results found. No images are attached to the encounter.  Labs: Lab Results  Component Value Date   HGBA1C 8.8 (H) 09/16/2020   HGBA1C 10.1 (H) 07/03/2020   HGBA1C 10.0 (A) 03/06/2020   REPTSTATUS 10/01/2020 FINAL 09/28/2020   GRAMSTAIN  09/10/2020    RARE WBC PRESENT, PREDOMINANTLY PMN ABUNDANT GRAM POSITIVE COCCI RARE GRAM NEGATIVE RODS    CULT (A) 09/28/2020    >=100,000 COLONIES/mL VANCOMYCIN RESISTANT ENTEROCOCCUS ISOLATED    LABORGA VANCOMYCIN RESISTANT ENTEROCOCCUS ISOLATED (A) 09/28/2020     Lab Results  Component Value Date   ALBUMIN <1.0 (L) 09/17/2020   ALBUMIN <1.0 (L) 09/16/2020   ALBUMIN <1.0 (L) 09/15/2020   PREALBUMIN 11.1 (L) 09/16/2020    Lab Results  Component Value Date   MG 2.2 09/17/2020   MG 1.9 09/16/2020   MG 1.9 09/15/2020   Lab Results  Component Value Date   VD25OH 12 (L) 10/20/2019   VD25OH 22 (L) 04/01/2019   VD25OH 29 (L) 08/17/2018    Lab Results  Component Value Date   PREALBUMIN 11.1 (L) 09/16/2020   CBC EXTENDED Latest Ref Rng & Units 09/28/2020 09/17/2020 09/16/2020  WBC 4.0 - 10.5 K/uL 3.2(L) 11.9(H) 11.0(H)  RBC 3.87 - 5.11 MIL/uL 2.68(L) 2.81(L) 2.91(L)  HGB 12.0 - 15.0 g/dL 7.9(L) 8.2(L) 8.6(L)  HCT 36.0 - 46.0 % 25.4(L) 25.7(L) 26.2(L)  PLT 150 - 400 K/uL 220 308 326  NEUTROABS 1.7 - 7.7 K/uL 2.3 - -  LYMPHSABS 0.7 - 4.0 K/uL 0.6(L) - -     There is no height or weight on file to calculate BMI.  Orders:  No orders of the defined types were placed in this encounter.  No orders of the defined types were placed in this encounter.  Procedures: No procedures performed  Clinical Data: No additional findings.  ROS:  All other systems negative, except as noted in the HPI. Review of Systems  Objective: Vital Signs: There were no vitals taken for this visit.  Specialty Comments:  No specialty comments available.  PMFS History: Patient Active Problem List   Diagnosis Date Noted  . MSSA bacteremia   . Acute hematogenous osteomyelitis of both feet ([place])   . Severe protein-calorie malnutrition ([place])   . BMI 50.0-59.9, adult ([place])   . Plantar ulcer of right foot ([place])   . Plantar ulcer of left foot ([place])   . Lymphedema   . Sepsis ([place]) 09/06/2020  . Cellulitis 04/27/2020  . AKI (acute kidney injury) ([place]) 04/27/2020  . Stasis dermatitis 04/27/2020  . Anxiety 03/31/2020  . Depression 03/31/2020  . GERD (gastroesophageal reflux disease)  03/31/2020  . Posttraumatic stress disorder 03/31/2020  . Personal history of noncompliance with medical treatment, presenting hazards to health 02/09/2018  . Abnormal CT of the abdomen 10/20/2017  . Dilated cbd, acquired 10/20/2017  . Essential hypertension, benign 01/14/2017  . Primary hypothyroidism 05/03/2015  . Mixed hyperlipidemia 05/03/2015  . Uncontrolled type 2 diabetes mellitus with complication, with long-term current use of insulin ([place]) 04/25/2015  . Morbid (severe) obesity due to excess calories ([place]) 04/25/2015  . Vitamin D deficiency 04/25/2015  . Cervical cancer ([place]) 10/05/2012   Past Medical History:  Diagnosis Date  . Anemia   . Cervical cancer ([place])   . Depression   . Diabetes mellitus, type II ([place])   . GERD (gastroesophageal reflux disease)   . Hyperlipidemia   . Neuropathy   . PTSD (post-traumatic stress disorder)     Family History  Problem Relation Age of Onset  . Hypertension Mother   . Diabetes Father   . Hyperlipidemia Father   . CAD Father   . Stroke Father   . Osteoporosis Maternal Grandmother   . Cancer Maternal Grandmother   . Hypertension Maternal Grandmother   . Colon cancer Neg Hx     Past Surgical History:  Procedure Laterality Date  . ABDOMINAL HYSTERECTOMY    . AMPUTATION Bilateral 09/13/2020   Procedure: BILATERAL BELOW KNEE AMPUTATIONS;  Surgeon: [name], MD;  Location: [facility];  Service: Orthopedics;  Laterality: Bilateral;  . COLONOSCOPY WITH PROPOFOL N/A 05/25/2018   Procedure: COLONOSCOPY WITH PROPOFOL;  Surgeon: Daneil Dolin, MD;  [facility];  Service: Endoscopy;  Laterality: N/A;  7:30am  . I & D EXTREMITY Bilateral 09/10/2020   Procedure: IRRIGATION AND DEBRIDEMENT FEET;;  Surgeon: [name], MD;  Location: [facility];  Service: Orthopedics;  Laterality: Bilateral;  . IRRIGATION AND DEBRIDEMENT FOOT Bilateral 09/08/2020   Procedure: IRRIGATION AND DEBRIDEMENT FOOT;  Surgeon: Virl Cagey, MD;  [facility];  Service: General;  Laterality: Bilateral;  left foot 11 x 10.5 x 1   . OTHER SURGICAL HISTORY Right    R Foot- I&D  . POLYPECTOMY  05/25/2018   Procedure: POLYPECTOMY;  Surgeon: Daneil Dolin, MD;  [facility];  Service: Endoscopy;;  colon  . UMBILICAL HERNIA REPAIR  2007   Social History   Occupational History  . Not on file  Tobacco Use  . Smoking status: Never Smoker  . Smokeless tobacco: Never Used  Vaping Use  . Vaping Use: Never used  Substance and Sexual Activity  . Alcohol use: No    Alcohol/week: 0.0 standard drinks  . Drug use:  No  . Sexual activity: Yes    Birth control/protection: Surgical

## 2020-10-02 NOTE — Therapy (Signed)
[facility] [place], [place], [address] Phone: [phone]   Fax:  [phone]  Patient Details  Name: Michelle Skinner MRN: [id number] Date of Birth: [birth year] Referring Provider:  No ref. provider found  Encounter Date: 10/02/2020  PHYSICAL THERAPY DISCHARGE SUMMARY  Visits from Start of Care: 12 Current functional level related to goals / functional outcomes: PT edema decreased significantly in B LE,   Remaining deficits: Continues to have edema   Education / Equipment: HEP, self massage and pump Plan: Patient agrees to discharge.  Patient goals were not met. Patient is being discharged due to not returning since the last visit.  ?????     Rayetta Humphrey, PT CLT [phone] 10/02/2020, 2:29 PM  [facility] [address] [place], [place], [address] Phone: [phone]   Fax:  [phone]

## 2020-10-03 ENCOUNTER — Telehealth: Payer: Self-pay | Admitting: *Deleted

## 2020-10-03 DIAGNOSIS — L03119 Cellulitis of unspecified part of limb: Secondary | ICD-10-CM | POA: Diagnosis not present

## 2020-10-03 DIAGNOSIS — R5381 Other malaise: Secondary | ICD-10-CM | POA: Diagnosis not present

## 2020-10-03 DIAGNOSIS — M869 Osteomyelitis, unspecified: Secondary | ICD-10-CM | POA: Diagnosis not present

## 2020-10-04 ENCOUNTER — Ambulatory Visit (INDEPENDENT_AMBULATORY_CARE_PROVIDER_SITE_OTHER): Payer: Medicare Other | Admitting: Family

## 2020-10-04 ENCOUNTER — Encounter: Payer: Self-pay | Admitting: Family

## 2020-10-04 ENCOUNTER — Other Ambulatory Visit: Payer: Self-pay

## 2020-10-04 DIAGNOSIS — Z452 Encounter for adjustment and management of vascular access device: Secondary | ICD-10-CM

## 2020-10-04 DIAGNOSIS — B9561 Methicillin susceptible Staphylococcus aureus infection as the cause of diseases classified elsewhere: Secondary | ICD-10-CM

## 2020-10-04 DIAGNOSIS — R7881 Bacteremia: Secondary | ICD-10-CM | POA: Diagnosis not present

## 2020-10-04 NOTE — Patient Instructions (Signed)
Nice to see you.  We will continue your Cefazolin for an additional 2 weeks then stop.  Continue wound care per Dr. Christena Flake.   Plan for follow up in 2-3 weeks near the completion of treatment.   Have a great day and stay safe!

## 2020-10-05 DIAGNOSIS — E559 Vitamin D deficiency, unspecified: Secondary | ICD-10-CM | POA: Diagnosis not present

## 2020-10-05 DIAGNOSIS — R7989 Other specified abnormal findings of blood chemistry: Secondary | ICD-10-CM | POA: Diagnosis not present

## 2020-10-05 DIAGNOSIS — Z79899 Other long term (current) drug therapy: Secondary | ICD-10-CM | POA: Diagnosis not present

## 2020-10-05 DIAGNOSIS — Z452 Encounter for adjustment and management of vascular access device: Secondary | ICD-10-CM | POA: Insufficient documentation

## 2020-10-05 DIAGNOSIS — E039 Hypothyroidism, unspecified: Secondary | ICD-10-CM | POA: Diagnosis not present

## 2020-10-05 NOTE — Assessment & Plan Note (Signed)
Michelle Skinner is a 55 y/o female with poorly controlled diabetes and cellulitis and wounds of her bilateral lower extremities with MSSA bacteremia and surgical cultures growing Stenotrophonmonas and Enterobacter in addition. Source control achieved with bilateral BKA. Following with orthopedics for continued wound care with concern for dehiscence on the right side. No systemic symptoms of fevers. Will continue antibiotics for additional 2 weeks with end date of 4/18 with orders to pull PICC at that time. Plan for follow up in 3 weeks or sooner if needed.

## 2020-10-05 NOTE — Assessment & Plan Note (Signed)
PICC line dressing changed today by [facility] staff. Continue basic PICC line care per protocol.

## 2020-10-05 NOTE — Progress Notes (Signed)
Subjective:    Patient ID: Michelle Skinner, female    DOB: 02-07-1966, 55 y.o.   MRN: [id number]  Chief Complaint  Patient presents with  . Follow-up    [facility]; resides at [facility]; changed PICC dressing today; due for change in 7 days     HPI:  Michelle Skinner is a 55 y.o. female with previous medical history of morbid obesity and poorly controlled diabetes (hemoglobin A1c of 10.1) who was recently admitted to [facility] for bilateral foot pain and was found to have MSSA bacteremia. Underwent debridement with Dr. 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 Surgical cultures grew MSSA, Stenotrophomonas malophilia, and enterobacter cloacae. TTE was negative for vegetations. Source control obtained with surgery and discharged with 4 weeks of Cefazolin through 10/09/20. In the interim was seen in the ED due to dislodgement of her PICC line which has been replaced. Blood culture was also noted to have Dipthroids in one bottle likely representing contaminate. Here today for routine follow up.  Michelle Skinner is at a [facility] and continues to receive her Cefazolin as prescribed. Does have some issues with obtaining her pain medication in a timely fashion. Wound care is being performed daily. Has been seen by Orthopedics with some concern for dehiscence on the right side. No fevers or chills. PICC line dressing noted to be loose by Michelle Skinner staff and replaced.  Struggling with multiple stressors having recently found out her mother passed away.    Allergies  Allergen Reactions  . Sulfa Antibiotics Diarrhea, Nausea And Vomiting and Rash  . Clindamycin/Lincomycin Diarrhea and Nausea And Vomiting  . Invokana [Canagliflozin] Diarrhea and Nausea And Vomiting  . Latex     Rash, itching, over a long period of time  . Tape Itching and Swelling    Reaction after a few days use/ please use paper tape  . Bactrim [Sulfamethoxazole-Trimethoprim] Diarrhea, Nausea And Vomiting  and Rash  . Cherry Itching, Swelling and Rash    Throat swelling  . Gabapentin Diarrhea, Nausea And Vomiting and Rash  . Other Itching, Swelling and Rash    Reaction to Hot peppers (throat swelling)      Outpatient Medications Prior to Visit  Medication Sig Dispense Refill  . ceFAZolin (ANCEF) IVPB Inject 2 g into the vein every 8 (eight) hours for 25 days. Indication:  MSSA Bacteremia  First Dose: No Last Day of Therapy:  10/09/2020 Labs - Once weekly:  CBC/D and BMP, Labs - Every other week:  ESR and CRP Method of administration: IV Push Method of administration may be changed at the discretion of home infusion pharmacist based upon assessment of the patient and/or caregiver's ability to self-administer the medication ordered. 75 Units 0  . Ascorbic Acid (VITAMIN C PO) Take 1 tablet by mouth at bedtime.    Marland Kitchen atorvastatin (LIPITOR) 40 MG tablet Take 1 tablet (40 mg total) by mouth at bedtime. 90 tablet 1  . carvedilol (COREG) 12.5 MG tablet Take 1 tablet (12.5 mg total) by mouth 2 (two) times daily. 60 tablet 11  . Cholecalciferol (VITAMIN D3) 125 MCG (5000 UT) CAPS Take 1 capsule (5,000 Units total) by mouth daily. 90 capsule 0  . Continuous Blood Gluc Receiver (FREESTYLE LIBRE 2 READER) DEVI As directed 1 each 0  . Continuous Blood Gluc Sensor (FREESTYLE LIBRE 2 SENSOR) MISC 1 Piece by Does not apply route every 14 (fourteen) days. 2 each 3  . enoxaparin (LOVENOX) 80 MG/0.8ML  injection Inject 0.7 mLs (70 mg total) into the skin daily.    . Ferrous Sulfate (IRON PO) Take 1 tablet by mouth at bedtime.    Marland Kitchen FLUoxetine (PROZAC) 40 MG capsule Take 40 mg by mouth at bedtime.    . folic acid (FOLVITE) 1 MG tablet Take 1 tablet (1 mg total) by mouth daily.    . insulin aspart (NOVOLOG) 100 UNIT/ML injection Hypoglycemia Standing Orders sidebar report CBG 70 - 120: 0 units CBG 121 - 150: 3 units CBG 151 - 200: 4 units CBG 201 - 250: 7 units CBG 251 - 300: 11 units CBG 301 - 350: 15  units CBG 351 - 400: 20 units CBG > 400: call MD and obtain STAT lab verification 10 mL 11  . insulin aspart (NOVOLOG) 100 UNIT/ML injection Inject 12 Units into the skin 3 (three) times daily with meals. 10 mL 11  . insulin glargine (LANTUS) 100 UNIT/ML injection Inject 0.28 mLs (28 Units total) into the skin 2 (two) times daily. 10 mL 11  . levothyroxine (SYNTHROID) 150 MCG tablet Take 1 tablet (150 mcg total) by mouth daily before breakfast. 90 tablet 1  . lisinopril (ZESTRIL) 20 MG tablet Take 20 mg by mouth at bedtime.    . magnesium citrate SOLN Take 296 mLs (1 Bottle total) by mouth daily as needed for severe constipation. 195 mL   . Multiple Vitamins-Minerals (PRESERVISION AREDS 2) CAPS Take 1 capsule by mouth 2 (two) times daily.    Marland Kitchen neomycin-bacitracin-polymyxin (NEOSPORIN) ointment Apply 1 application topically every 12 (twelve) hours. To legs    . nutrition supplement, JUVEN, (JUVEN) PACK Take 1 packet by mouth 2 (two) times daily between meals.  0  . pantoprazole (PROTONIX) 40 MG tablet TAKE ONE (1) TABLET EACH DAY (Patient taking differently: Take 40 mg by mouth at bedtime.) 90 tablet 0  . polyethylene glycol (MIRALAX / GLYCOLAX) 17 g packet Take 17 g by mouth 2 (two) times daily as needed for mild constipation. 14 each 0  . senna-docusate (SENOKOT-S) 8.6-50 MG tablet Take 1 tablet by mouth 2 (two) times daily as needed for moderate constipation.    . Thiamine HCl (VITAMIN B-1 PO) Take 1 tablet by mouth daily.     No facility-administered medications prior to visit.     Past Medical History:  Diagnosis Date  . Anemia   . Cervical cancer ([place])   . Depression   . Diabetes mellitus, type II ([place])   . GERD (gastroesophageal reflux disease)   . Hyperlipidemia   . Neuropathy   . PTSD (post-traumatic stress disorder)       Past Surgical History:  Procedure Laterality Date  . ABDOMINAL HYSTERECTOMY    . AMPUTATION Bilateral 09/13/2020   Procedure: BILATERAL BELOW KNEE  AMPUTATIONS;  Surgeon: [name], MD;  Location: [facility];  Service: Orthopedics;  Laterality: Bilateral;  . COLONOSCOPY WITH PROPOFOL N/A 05/25/2018   Procedure: COLONOSCOPY WITH PROPOFOL;  Surgeon: Daneil Dolin, MD;  [facility];  Service: Endoscopy;  Laterality: N/A;  7:30am  . I & D EXTREMITY Bilateral 09/10/2020   Procedure: IRRIGATION AND DEBRIDEMENT FEET;;  Surgeon: [name], MD;  Location: [facility];  Service: Orthopedics;  Laterality: Bilateral;  . IRRIGATION AND DEBRIDEMENT FOOT Bilateral 09/08/2020   Procedure: IRRIGATION AND DEBRIDEMENT FOOT;  Surgeon: Virl Cagey, MD;  [facility];  Service: General;  Laterality: Bilateral;  left foot 11 x 10.5 x 1   . OTHER  SURGICAL HISTORY Right    R Foot- I&D  . POLYPECTOMY  05/25/2018   Procedure: POLYPECTOMY;  Surgeon: Daneil Dolin, MD;  [facility];  Service: Endoscopy;;  colon  . UMBILICAL HERNIA REPAIR  2007      Family History  Problem Relation Age of Onset  . Hypertension Mother   . Diabetes Father   . Hyperlipidemia Father   . CAD Father   . Stroke Father   . Osteoporosis Maternal Grandmother   . Cancer Maternal Grandmother   . Hypertension Maternal Grandmother   . Colon cancer Neg Hx       Social History   Socioeconomic History  . Marital status: Married    Spouse name: Not on file  . Number of children: Not on file  . Years of education: Not on file  . Highest education level: Not on file  Occupational History  . Not on file  Tobacco Use  . Smoking status: Never Smoker  . Smokeless tobacco: Never Used  Vaping Use  . Vaping Use: Never used  Substance and Sexual Activity  . Alcohol use: No    Alcohol/week: 0.0 standard drinks  . Drug use: No  . Sexual activity: Yes    Birth control/protection: Surgical  Other Topics Concern  . Not on file  Social History Narrative  . Not on file   Social Determinants of Health   Financial Resource Strain: Not on file  Food  Insecurity: Not on file  Transportation Needs: Not on file  Physical Activity: Not on file  Stress: Not on file  Social Connections: Not on file  Intimate Partner Violence: Not on file      Review of Systems  Constitutional: Negative for chills, diaphoresis, fatigue and fever.  Respiratory: Negative for cough, chest tightness, shortness of breath and wheezing.   Cardiovascular: Negative for chest pain.  Gastrointestinal: Negative for abdominal pain, diarrhea, nausea and vomiting.       Objective:    BP (!) 146/68   Pulse 65  Nursing note and vital signs reviewed.  Physical Exam Constitutional:      General: She is not in acute distress.    Appearance: She is well-developed. She is obese.     Comments: Seated in the wheelchair; pleasant.   Cardiovascular:     Rate and Rhythm: Normal rate and regular rhythm.     Heart sounds: Normal heart sounds.  Pulmonary:     Effort: Pulmonary effort is normal.     Breath sounds: Normal breath sounds.  Musculoskeletal:     Comments: Dressing in place bilaterally with no shadowing or noted drainage.   Skin:    General: Skin is warm and dry.  Neurological:     Mental Status: She is alert and oriented to person, place, and time.  Psychiatric:        Mood and Affect: Mood normal.         Assessment & Plan:   Patient Active Problem List   Diagnosis Date Noted  . Peripherally inserted central catheter (PICC) in place 10/05/2020  . MSSA bacteremia   . Acute hematogenous osteomyelitis of both feet ([place])   . Severe protein-calorie malnutrition ([place])   . BMI 50.0-59.9, adult ([place])   . Plantar ulcer of right foot ([place])   . Plantar ulcer of left foot ([place])   . Lymphedema   . Sepsis ([place]) 09/06/2020  . Cellulitis 04/27/2020  . AKI (acute kidney injury) ([place]) 04/27/2020  . Stasis  dermatitis 04/27/2020  . Anxiety 03/31/2020  . Depression 03/31/2020  . GERD (gastroesophageal reflux disease) 03/31/2020  . Posttraumatic stress  disorder 03/31/2020  . Personal history of noncompliance with medical treatment, presenting hazards to health 02/09/2018  . Abnormal CT of the abdomen 10/20/2017  . Dilated cbd, acquired 10/20/2017  . Essential hypertension, benign 01/14/2017  . Primary hypothyroidism 05/03/2015  . Mixed hyperlipidemia 05/03/2015  . Uncontrolled type 2 diabetes mellitus with complication, with long-term current use of insulin ([place]) 04/25/2015  . Morbid (severe) obesity due to excess calories ([place]) 04/25/2015  . Vitamin D deficiency 04/25/2015  . Cervical cancer ([place]) 10/05/2012     Problem List Items Addressed This Visit      Other   MSSA bacteremia    Michelle Skinner is a 55 y/o female with poorly controlled diabetes and cellulitis and wounds of her bilateral lower extremities with MSSA bacteremia and surgical cultures growing Stenotrophonmonas and Enterobacter in addition. Source control achieved with bilateral BKA. Following with orthopedics for continued wound care with concern for dehiscence on the right side. No systemic symptoms of fevers. Will continue antibiotics for additional 2 weeks with end date of 4/18 with orders to pull PICC at that time. Plan for follow up in 3 weeks or sooner if needed.       Peripherally inserted central catheter (PICC) in place    PICC line dressing changed today by [facility] staff. Continue basic PICC line care per protocol.           I am having Michelle Berninger. Skinner maintain her Vitamin D3, pantoprazole, FreeStyle Libre 2 Sensor, [employer] 2 Reader, neomycin-bacitracin-polymyxin, Thiamine HCl (VITAMIN B-1 PO), Ascorbic Acid (VITAMIN C PO), FLUoxetine, levothyroxine, atorvastatin, lisinopril, PreserVision AREDS 2, Ferrous Sulfate (IRON PO), ceFAZolin, insulin aspart, enoxaparin, folic acid, insulin glargine, magnesium citrate, nutrition supplement (JUVEN), polyethylene glycol, senna-docusate, insulin aspart, and carvedilol.   Follow-up: Return in about 3 weeks  (around 10/25/2020), or if symptoms worsen or fail to improve.    Terri Piedra, MSN, FNP-C Nurse Practitioner [facility] [place] number: [phone]

## 2020-10-09 ENCOUNTER — Encounter: Payer: Self-pay | Admitting: Orthopedic Surgery

## 2020-10-09 ENCOUNTER — Other Ambulatory Visit: Payer: Self-pay

## 2020-10-09 ENCOUNTER — Ambulatory Visit: Payer: Medicare Other | Admitting: Orthopedic Surgery

## 2020-10-09 ENCOUNTER — Ambulatory Visit (INDEPENDENT_AMBULATORY_CARE_PROVIDER_SITE_OTHER): Payer: Medicare Other | Admitting: Physician Assistant

## 2020-10-09 VITALS — Ht 67.0 in | Wt 326.0 lb

## 2020-10-09 DIAGNOSIS — Z89512 Acquired absence of left leg below knee: Secondary | ICD-10-CM

## 2020-10-09 DIAGNOSIS — S88119A Complete traumatic amputation at level between knee and ankle, unspecified lower leg, initial encounter: Secondary | ICD-10-CM

## 2020-10-09 NOTE — Progress Notes (Signed)
Office Visit Note   Patient: Michelle Skinner           Date of Birth: [birth year]           MRN: [id number] Visit Date: 10/09/2020              Requested by: Monico Blitz, MD [address] [place],  [place] [address] PCP: Monico Blitz, MD  Chief Complaint  Patient presents with  . Right Knee - Follow-up    Bilateral below knee amputations 09/13/2020  . Left Knee - Follow-up      HPI: Patient is 1 month status post bilateral below-knee amputations.  The left side she is status feeling good the right side still bleeds and is hurting.  She does say that she has not been getting adequate protein as we directed and regular dressing changes as Dr. Sharol Given indicated at her last visit  Assessment & Plan: Visit Diagnoses: No diagnosis found.  Plan: Continue with dressing changes very concerned about the lateral side of the right BKA stump as it is has some necrosis.  We will continue to do daily dressing changes follow-up 1 week  Follow-Up Instructions: No follow-ups on file.   Ortho Exam  Patient is alert, oriented, no adenopathy, well-dressed, normal affect, normal respiratory effort. Left below-knee amputation stump has well apposed wound edges moderate soft tissue swelling no ascending cellulitis or signs of infection almost all the sutures and sutures were removed today. So on the right below-knee amputation site she does have wound dehiscence on the lateral aspect.  This does not probe deeply.  She does have no ascending cellulitis no foul odor.  Does have some necrosis in that area otherwise fairly well apposed wound edges several of the sutures and staples were removed today  Imaging: No results found. No images are attached to the encounter.  Labs: Lab Results  Component Value Date   HGBA1C 8.8 (H) 09/16/2020   HGBA1C 10.1 (H) 07/03/2020   HGBA1C 10.0 (A) 03/06/2020   REPTSTATUS 10/01/2020 FINAL 09/28/2020   GRAMSTAIN  09/10/2020    RARE WBC PRESENT, PREDOMINANTLY PMN ABUNDANT  GRAM POSITIVE COCCI RARE GRAM NEGATIVE RODS    CULT (A) 09/28/2020    >=100,000 COLONIES/mL VANCOMYCIN RESISTANT ENTEROCOCCUS ISOLATED   LABORGA VANCOMYCIN RESISTANT ENTEROCOCCUS ISOLATED (A) 09/28/2020     Lab Results  Component Value Date   ALBUMIN <1.0 (L) 09/17/2020   ALBUMIN <1.0 (L) 09/16/2020   ALBUMIN <1.0 (L) 09/15/2020   PREALBUMIN 11.1 (L) 09/16/2020    Lab Results  Component Value Date   MG 2.2 09/17/2020   MG 1.9 09/16/2020   MG 1.9 09/15/2020   Lab Results  Component Value Date   VD25OH 12 (L) 10/20/2019   VD25OH 22 (L) 04/01/2019   VD25OH 29 (L) 08/17/2018    Lab Results  Component Value Date   PREALBUMIN 11.1 (L) 09/16/2020   CBC EXTENDED Latest Ref Rng & Units 09/28/2020 09/17/2020 09/16/2020  WBC 4.0 - 10.5 K/uL 3.2(L) 11.9(H) 11.0(H)  RBC 3.87 - 5.11 MIL/uL 2.68(L) 2.81(L) 2.91(L)  HGB 12.0 - 15.0 g/dL 7.9(L) 8.2(L) 8.6(L)  HCT 36.0 - 46.0 % 25.4(L) 25.7(L) 26.2(L)  PLT 150 - 400 K/uL 220 308 326  NEUTROABS 1.7 - 7.7 K/uL 2.3 - -  LYMPHSABS 0.7 - 4.0 K/uL 0.6(L) - -     Body mass index is 51.06 kg/m.  Orders:  No orders of the defined types were placed in this encounter.  No orders of the defined  types were placed in this encounter.    Procedures: No procedures performed  Clinical Data: No additional findings.  ROS:  All other systems negative, except as noted in the HPI. Review of Systems  Objective: Vital Signs: Ht 5\' 7"  (1.702 m)   Wt (!) 326 lb (147.9 kg)   BMI 51.06 kg/m   Specialty Comments:  No specialty comments available.  PMFS History: Patient Active Problem List   Diagnosis Date Noted  . Peripherally inserted central catheter (PICC) in place 10/05/2020  . MSSA bacteremia   . Acute hematogenous osteomyelitis of both feet ([place])   . Severe protein-calorie malnutrition ([place])   . BMI 50.0-59.9, adult ([place])   . Plantar ulcer of right foot ([place])   . Plantar ulcer of left foot ([place])   . Lymphedema   . Sepsis ([place])  09/06/2020  . Cellulitis 04/27/2020  . AKI (acute kidney injury) ([place]) 04/27/2020  . Stasis dermatitis 04/27/2020  . Anxiety 03/31/2020  . Depression 03/31/2020  . GERD (gastroesophageal reflux disease) 03/31/2020  . Posttraumatic stress disorder 03/31/2020  . Personal history of noncompliance with medical treatment, presenting hazards to health 02/09/2018  . Abnormal CT of the abdomen 10/20/2017  . Dilated cbd, acquired 10/20/2017  . Essential hypertension, benign 01/14/2017  . Primary hypothyroidism 05/03/2015  . Mixed hyperlipidemia 05/03/2015  . Uncontrolled type 2 diabetes mellitus with complication, with long-term current use of insulin ([place]) 04/25/2015  . Morbid (severe) obesity due to excess calories ([place]) 04/25/2015  . Vitamin D deficiency 04/25/2015  . Cervical cancer ([place]) 10/05/2012   Past Medical History:  Diagnosis Date  . Anemia   . Cervical cancer ([place])   . Depression   . Diabetes mellitus, type II ([place])   . GERD (gastroesophageal reflux disease)   . Hyperlipidemia   . Neuropathy   . PTSD (post-traumatic stress disorder)     Family History  Problem Relation Age of Onset  . Hypertension Mother   . Diabetes Father   . Hyperlipidemia Father   . CAD Father   . Stroke Father   . Osteoporosis Maternal Grandmother   . Cancer Maternal Grandmother   . Hypertension Maternal Grandmother   . Colon cancer Neg Hx     Past Surgical History:  Procedure Laterality Date  . ABDOMINAL HYSTERECTOMY    . AMPUTATION Bilateral 09/13/2020   Procedure: BILATERAL BELOW KNEE AMPUTATIONS;  Surgeon: [name], MD;  Location: [facility];  Service: Orthopedics;  Laterality: Bilateral;  . COLONOSCOPY WITH PROPOFOL N/A 05/25/2018   Procedure: COLONOSCOPY WITH PROPOFOL;  Surgeon: Daneil Dolin, MD;  [facility];  Service: Endoscopy;  Laterality: N/A;  7:30am  . I & D EXTREMITY Bilateral 09/10/2020   Procedure: IRRIGATION AND DEBRIDEMENT FEET;;  Surgeon: [name], MD;   Location: [facility];  Service: Orthopedics;  Laterality: Bilateral;  . IRRIGATION AND DEBRIDEMENT FOOT Bilateral 09/08/2020   Procedure: IRRIGATION AND DEBRIDEMENT FOOT;  Surgeon: Virl Cagey, MD;  [facility];  Service: General;  Laterality: Bilateral;  left foot 11 x 10.5 x 1   . OTHER SURGICAL HISTORY Right    R Foot- I&D  . POLYPECTOMY  05/25/2018   Procedure: POLYPECTOMY;  Surgeon: Daneil Dolin, MD;  [facility];  Service: Endoscopy;;  colon  . UMBILICAL HERNIA REPAIR  2007   Social History   Occupational History  . Not on file  Tobacco Use  . Smoking status: Never Smoker  . Smokeless tobacco: Never Used  Vaping Use  . Vaping Use: Never used  Substance and Sexual Activity  . Alcohol use: No    Alcohol/week: 0.0 standard drinks  . Drug use: No  . Sexual activity: Yes    Birth control/protection: Surgical

## 2020-10-12 DIAGNOSIS — E039 Hypothyroidism, unspecified: Secondary | ICD-10-CM | POA: Diagnosis not present

## 2020-10-12 DIAGNOSIS — E118 Type 2 diabetes mellitus with unspecified complications: Secondary | ICD-10-CM | POA: Diagnosis not present

## 2020-10-12 DIAGNOSIS — M86171 Other acute osteomyelitis, right ankle and foot: Secondary | ICD-10-CM | POA: Diagnosis not present

## 2020-10-13 ENCOUNTER — Encounter: Payer: Self-pay | Admitting: "Endocrinology

## 2020-10-13 ENCOUNTER — Ambulatory Visit (INDEPENDENT_AMBULATORY_CARE_PROVIDER_SITE_OTHER): Payer: Medicare Other | Admitting: "Endocrinology

## 2020-10-13 VITALS — BP 126/84 | HR 64

## 2020-10-13 DIAGNOSIS — E118 Type 2 diabetes mellitus with unspecified complications: Secondary | ICD-10-CM

## 2020-10-13 DIAGNOSIS — Z794 Long term (current) use of insulin: Secondary | ICD-10-CM

## 2020-10-13 DIAGNOSIS — IMO0002 Reserved for concepts with insufficient information to code with codable children: Secondary | ICD-10-CM

## 2020-10-13 DIAGNOSIS — I10 Essential (primary) hypertension: Secondary | ICD-10-CM | POA: Diagnosis not present

## 2020-10-13 DIAGNOSIS — E039 Hypothyroidism, unspecified: Secondary | ICD-10-CM | POA: Diagnosis not present

## 2020-10-13 DIAGNOSIS — E782 Mixed hyperlipidemia: Secondary | ICD-10-CM

## 2020-10-13 DIAGNOSIS — E1165 Type 2 diabetes mellitus with hyperglycemia: Secondary | ICD-10-CM | POA: Diagnosis not present

## 2020-10-13 MED ORDER — INSULIN GLARGINE 100 UNIT/ML ~~LOC~~ SOLN: 30.0000 [IU] | Freq: Every day | SUBCUTANEOUS | 3 refills | Status: DC

## 2020-10-13 MED ORDER — INSULIN ASPART 100 UNIT/ML ~~LOC~~ SOLN
10.0000 [IU] | Freq: Three times a day (TID) | SUBCUTANEOUS | 2 refills | Status: DC
Start: 2020-10-13 — End: 2021-03-29

## 2020-10-13 NOTE — Patient Instructions (Signed)
                                     Advice for Weight Management  -For most of us the best way to lose weight is by diet management. Generally speaking, diet management means consuming less calories intentionally which over time brings about progressive weight loss.  This can be achieved more effectively by restricting carbohydrate consumption to the minimum possible.  So, it is critically important to know your numbers: how much calorie you are consuming and how much calorie you need. More importantly, our carbohydrates sources should be unprocessed or minimally processed complex starch food items.   Sometimes, it is important to balance nutrition by increasing protein intake (animal or plant source), fruits, and vegetables.  -Sticking to a routine mealtime to eat 3 meals a day and avoiding unnecessary snacks is shown to have a big role in weight control. Under normal circumstances, the only time we lose real weight is when we are hungry, so allow hunger to take place- hunger means no food between meal times, only water.  It is not advisable to starve.   -It is better to avoid simple carbohydrates including: Cakes, Sweet Desserts, Ice Cream, Soda (diet and regular), Sweet Tea, Candies, Chips, Cookies, Store Bought Juices, Alcohol in Excess of  1-2 drinks a day, Lemonade,  Artificial Sweeteners, Doughnuts, Coffee Creamers, "Sugar-free" Products, etc, etc.  This is not a complete list.....    -Consulting with certified diabetes educators is proven to provide you with the most accurate and current information on diet.  Also, you may be  interested in discussing diet options/exchanges , we can schedule a visit with Michelle Skinner, RDN, CDE for individualized nutrition education.  -Exercise: If you are able: 30 -60 minutes a day ,4 days a week, or 150 minutes a week.  The longer the better.  Combine stretch, strength, and aerobic activities.  If you were told in the  past that you have high risk for cardiovascular diseases, you may seek evaluation by your heart doctor prior to initiating moderate to intense exercise programs.                                  Additional Care Considerations for Diabetes   -Diabetes  is a chronic disease.  The most important care consideration is regular follow-up with your diabetes care provider with the goal being avoiding or delaying its complications and to take advantage of advances in medications and technology.    -Type 2 diabetes is known to coexist with other important comorbidities such as high blood pressure and high cholesterol.  It is critical to control not only the diabetes but also the high blood pressure and high cholesterol to minimize and delay the risk of complications including coronary artery disease, stroke, amputations, blindness, etc.    - Studies showed that people with diabetes will benefit from a class of medications known as ACE inhibitors and statins.  Unless there are specific reasons not to be on these medications, the standard of care is to consider getting one from these groups of medications at an optimal doses.  These medications are generally considered safe and proven to help protect the heart and the kidneys.    - People with diabetes are encouraged to initiate and maintain regular follow-up with eye doctors, foot   doctors, dentists , and if necessary heart and kidney doctors.     - It is highly recommended that people with diabetes quit smoking or stay away from smoking, and get yearly  flu vaccine and pneumonia vaccine at least every 5 years.  One other important lifestyle recommendation is to ensure adequate sleep - at least 6-7 hours of uninterrupted sleep at night.  -Exercise: If you are able: 30 -60 minutes a day, 4 days a week, or 150 minutes a week.  The longer the better.  Combine stretch, strength, and aerobic activities.  If you were told in the past that you have high risk for  cardiovascular diseases, you may seek evaluation by your heart doctor prior to initiating moderate to intense exercise programs.          

## 2020-10-13 NOTE — Progress Notes (Signed)
10/13/2020  Endocrinology follow-up note  Subjective:    Patient ID: Michelle Skinner, female    DOB: 1965-12-21,    Past Medical History:  Diagnosis Date  . Anemia   . Cervical cancer ([place])   . Depression   . Diabetes mellitus, type II ([place])   . GERD (gastroesophageal reflux disease)   . Hyperlipidemia   . Neuropathy   . PTSD (post-traumatic stress disorder)    Past Surgical History:  Procedure Laterality Date  . ABDOMINAL HYSTERECTOMY    . AMPUTATION Bilateral 09/13/2020   Procedure: BILATERAL BELOW KNEE AMPUTATIONS;  Surgeon: [name], MD;  Location: [place];  Service: Orthopedics;  Laterality: Bilateral;  . COLONOSCOPY WITH PROPOFOL N/A 05/25/2018   Procedure: COLONOSCOPY WITH PROPOFOL;  Surgeon: Daneil Dolin, MD;  [facility];  Service: Endoscopy;  Laterality: N/A;  7:30am  . I & D EXTREMITY Bilateral 09/10/2020   Procedure: IRRIGATION AND DEBRIDEMENT FEET;;  Surgeon: [name], MD;  Location: [facility] [place];  Service: Orthopedics;  Laterality: Bilateral;  . IRRIGATION AND DEBRIDEMENT FOOT Bilateral 09/08/2020   Procedure: IRRIGATION AND DEBRIDEMENT FOOT;  Surgeon: Virl Cagey, MD;  [facility];  Service: General;  Laterality: Bilateral;  left foot 11 x 10.5 x 1   . OTHER SURGICAL HISTORY Right    R Foot- I&D  . POLYPECTOMY  05/25/2018   Procedure: POLYPECTOMY;  Surgeon: Daneil Dolin, MD;  [facility];  Service: Endoscopy;;  colon  . UMBILICAL HERNIA REPAIR  2007   Social History   Socioeconomic History  . Marital status: Married    Spouse name: Not on file  . Number of children: Not on file  . Years of education: Not on file  . Highest education level: Not on file  Occupational History  . Not on file  Tobacco Use  . Smoking status: Never Smoker  . Smokeless tobacco: Never Used  Vaping Use  . Vaping Use: Never used  Substance and Sexual Activity  . Alcohol use: No    Alcohol/week: 0.0 standard drinks  . Drug use: No  .  Sexual activity: Yes    Birth control/protection: Surgical  Other Topics Concern  . Not on file  Social History Narrative  . Not on file   Social Determinants of Health   Financial Resource Strain: Not on file  Food Insecurity: Not on file  Transportation Needs: Not on file  Physical Activity: Not on file  Stress: Not on file  Social Connections: Not on file   Outpatient Encounter Medications as of 10/13/2020  Medication Sig  . Calcium Carbonate (CALCIUM 500 PO) Take 1 tablet by mouth daily.  . Ferrous Sulfate (IRON) 325 (65 Fe) MG TABS Take 1 tablet by mouth at bedtime.  . ondansetron (ZOFRAN) 4 MG tablet Take 4 mg by mouth every 8 (eight) hours as needed for nausea or vomiting.  . Oxycodone HCl 10 MG TABS Take 10 mg by mouth every 6 (six) hours as needed.  . Ascorbic Acid (VITAMIN C PO) Take 1 tablet by mouth at bedtime.  Marland Kitchen atorvastatin (LIPITOR) 40 MG tablet Take 1 tablet (40 mg total) by mouth at bedtime.  . carvedilol (COREG) 12.5 MG tablet Take 1 tablet (12.5 mg total) by mouth 2 (two) times daily.  . Cholecalciferol (VITAMIN D3) 125 MCG (5000 UT) CAPS Take 1 capsule (5,000 Units total) by mouth daily.  . Continuous Blood Gluc Receiver (FREESTYLE LIBRE 2 READER) DEVI As directed  .  Continuous Blood Gluc Sensor (FREESTYLE LIBRE 2 SENSOR) MISC 1 Piece by Does not apply route every 14 (fourteen) days.  Marland Kitchen enoxaparin (LOVENOX) 80 MG/0.8ML injection Inject 0.7 mLs (70 mg total) into the skin daily.  . Ferrous Sulfate (IRON PO) Take 1 tablet by mouth at bedtime.  Marland Kitchen FLUoxetine (PROZAC) 40 MG capsule Take 40 mg by mouth at bedtime.  . folic acid (FOLVITE) 1 MG tablet Take 1 tablet (1 mg total) by mouth daily.  . insulin aspart (NOVOLOG) 100 UNIT/ML injection Inject 10-16 Units into the skin 3 (three) times daily before meals.  . insulin glargine (LANTUS) 100 UNIT/ML injection Inject 0.3 mLs (30 Units total) into the skin at bedtime.  Marland Kitchen levothyroxine (SYNTHROID) 150 MCG tablet Take 1  tablet (150 mcg total) by mouth daily before breakfast.  . lisinopril (ZESTRIL) 20 MG tablet Take 20 mg by mouth at bedtime.  . magnesium citrate SOLN Take 296 mLs (1 Bottle total) by mouth daily as needed for severe constipation.  . Multiple Vitamins-Minerals (PRESERVISION AREDS 2) CAPS Take 1 capsule by mouth 2 (two) times daily.  Marland Kitchen neomycin-bacitracin-polymyxin (NEOSPORIN) ointment Apply 1 application topically every 12 (twelve) hours. To legs  . nutrition supplement, JUVEN, (JUVEN) PACK Take 1 packet by mouth 2 (two) times daily between meals.  . pantoprazole (PROTONIX) 40 MG tablet TAKE ONE (1) TABLET EACH DAY (Patient taking differently: Take 40 mg by mouth at bedtime.)  . polyethylene glycol (MIRALAX / GLYCOLAX) 17 g packet Take 17 g by mouth 2 (two) times daily as needed for mild constipation.  . senna-docusate (SENOKOT-S) 8.6-50 MG tablet Take 1 tablet by mouth 2 (two) times daily as needed for moderate constipation.  . Thiamine HCl (VITAMIN B-1 PO) Take 1 tablet by mouth daily.  . [DISCONTINUED] insulin aspart (NOVOLOG) 100 UNIT/ML injection Hypoglycemia Standing Orders sidebar report CBG 70 - 120: 0 units CBG 121 - 150: 3 units CBG 151 - 200: 4 units CBG 201 - 250: 7 units CBG 251 - 300: 11 units CBG 301 - 350: 15 units CBG 351 - 400: 20 units CBG > 400: call MD and obtain STAT lab verification  . [DISCONTINUED] insulin aspart (NOVOLOG) 100 UNIT/ML injection Inject 12 Units into the skin 3 (three) times daily with meals.  . [DISCONTINUED] insulin glargine (LANTUS) 100 UNIT/ML injection Inject 0.28 mLs (28 Units total) into the skin 2 (two) times daily.   No facility-administered encounter medications on file as of 10/13/2020.   ALLERGIES: Allergies  Allergen Reactions  . Sulfa Antibiotics Diarrhea, Nausea And Vomiting and Rash  . Clindamycin/Lincomycin Diarrhea and Nausea And Vomiting  . Invokana [Canagliflozin] Diarrhea and Nausea And Vomiting  . Latex     Rash, itching, over  a long period of time  . Tape Itching and Swelling    Reaction after a few days use/ please use paper tape  . Bactrim [Sulfamethoxazole-Trimethoprim] Diarrhea, Nausea And Vomiting and Rash  . Cherry Itching, Swelling and Rash    Throat swelling  . Gabapentin Diarrhea, Nausea And Vomiting and Rash  . Other Itching, Swelling and Rash    Reaction to Hot peppers (throat swelling)   VACCINATION STATUS: Immunization History  Administered Date(s) Administered  . Influenza Split 08/15/2014  . Influenza,inj,Quad PF,6+ Mos 06/10/2017, 04/30/2020  . Tdap 09/02/2014    Diabetes She presents for her follow-up diabetic visit. She has type 2 diabetes mellitus. Onset time: She was diagnosed at approximate age of 15 years. Her disease course has been worsening (In  the interval, she was hospitalized for bilateral lower extremity cellulitis complicated by osteomyelitis which led to bilateral below-knee amputation, currently recovering in nursing home.). There are no hypoglycemic associated symptoms. Pertinent negatives for hypoglycemia include no confusion, headaches, pallor or seizures. Associated symptoms include fatigue, polydipsia and polyuria. Pertinent negatives for diabetes include no chest pain and no polyphagia. There are no hypoglycemic complications. Symptoms are improving. Diabetic complications include PVD and retinopathy. Pertinent negatives for diabetic complications include no autonomic neuropathy or peripheral neuropathy. (She has bilateral Charcot's feet, bilateral lower extremity lymphedema.) Risk factors for coronary artery disease include diabetes mellitus, dyslipidemia, hypertension, obesity and sedentary lifestyle. Current diabetic treatment includes intensive insulin program. She is compliant with treatment most of the time. Her weight is fluctuating minimally. She is following a generally unhealthy diet. She has not had a previous visit with a dietitian (She missed her appointment.). She  never participates in exercise. She monitors blood glucose at home 3-4 x per day. Her home blood glucose trend is decreasing steadily. Her breakfast blood glucose range is generally 140-180 mg/dl. Her lunch blood glucose range is generally 140-180 mg/dl. Her dinner blood glucose range is generally 140-180 mg/dl. Her bedtime blood glucose range is generally 140-180 mg/dl. Her overall blood glucose range is 140-180 mg/dl. (She returns with improved A1c of 8.8% from 10.1%.  Unfortunately, patient underwent bilateral below-knee amputation from complicated cellulitis/osteomyelitis.  She is currently recovering in nursing home.  Her insulin regimen was switched from insulin U500 to Lantus and NovoLog.    ) An ACE inhibitor/angiotensin II receptor blocker is not being taken. Eye exam is current.  Hyperlipidemia This is a chronic problem. The current episode started more than 1 year ago. The problem is uncontrolled. Recent lipid tests were reviewed and are high. Exacerbating diseases include diabetes, hypothyroidism and obesity. Pertinent negatives include no chest pain, myalgias or shortness of breath. She is currently on no antihyperlipidemic treatment.  Thyroid Problem Presents for initial visit. Symptoms include fatigue. Patient reports no cold intolerance, diarrhea, heat intolerance or palpitations. The following procedures have not been performed: radioiodine uptake scan and thyroidectomy. Her past medical history is significant for diabetes and hyperlipidemia.     Review of systems    Objective:    BP 126/84   Pulse 64   Wt Readings from Last 3 Encounters:  10/09/20 (!) 326 lb (147.9 kg)  09/28/20 (!) 326 lb 8 oz (148.1 kg)  09/13/20 (!) 326 lb 8 oz (148.1 kg)     Physical Exam- Limited  Constitutional: On recliner.  She is status post bilateral below-knee amputation.   Psych: Reluctant affect.  CMP Latest Ref Rng & Units 09/28/2020 09/17/2020 09/16/2020  Glucose 70 - 99 mg/dL 127(H)  170(H) 165(H)  BUN 6 - 20 mg/dL 15 20 26(H)  Creatinine 0.44 - 1.00 mg/dL 0.70 0.85 1.03(H)  Sodium 135 - 145 mmol/L 136 136 137  Potassium 3.5 - 5.1 mmol/L 3.3(L) 4.8 4.4  Chloride 98 - 111 mmol/L 102 105 105  CO2 22 - 32 mmol/L 26 28 27   Calcium 8.9 - 10.3 mg/dL 7.8(L) 7.6(L) 7.7(L)  Total Protein 6.5 - 8.1 g/dL - - -  Total Bilirubin 0.3 - 1.2 mg/dL - - -  Alkaline Phos 38 - 126 U/L - - -  AST 15 - 41 U/L - - -  ALT 0 - 44 U/L - - -      Diabetic Labs (most recent): Lab Results  Component Value Date   HGBA1C 8.8 (H)  09/16/2020   HGBA1C 10.1 (H) 07/03/2020   HGBA1C 10.0 (A) 03/06/2020    Lipid Panel     Component Value Date/Time   CHOL 188 07/03/2020 1001   TRIG 192 (H) 07/03/2020 1001   HDL 42 (L) 07/03/2020 1001   CHOLHDL 4.5 07/03/2020 1001   VLDL 42 (H) 04/25/2015 1001   LDLCALC 115 (H) 07/03/2020 1001    Assessment & Plan:   1. Uncontrolled type 2 diabetes mellitus with complication, with long-term current use of insulin ([place])   Her diabetes is complicated by bilateral peripheral neuropathy , lateral Charcot's feet status post bilateral below-knee amputation due to complicated cellulitis/osteomyelitis, history of noncompliance/nonadherence, and possible retinopathy.  - She has chronically uncontrolled symptomatic type 2 DM of approximately since age 24 years duration.  She returns with improved A1c of 8.8% from 10.1%.  Unfortunately, patient underwent bilateral below-knee amputation from complicated cellulitis/osteomyelitis.  She is currently recovering in nursing home.  Her insulin regimen was switched from insulin U500 to Lantus and NovoLog.    -She remains at extremely high risk for more acute and chronic complications of diabetes which include CAD, CVA, CKD, retinopathy, and neuropathy. These are all discussed in detail with the patient.   - I have counseled the patient on diet management and weight loss, by adopting a carbohydrate restricted/protein rich  diet.  - Patient is advised to stick to a routine mealtimes to eat 3 meals  a day and avoid unnecessary snacks ( to snack only to correct hypoglycemia).   - she acknowledges that there is a room for improvement in her food and drink choices. - Suggestion is made for her to avoid simple carbohydrates  from her diet including Cakes, Sweet Desserts, Ice Cream, Soda (diet and regular), Sweet Tea, Candies, Chips, Cookies, Store Bought Juices, Alcohol in Excess of  1-2 drinks a day, Artificial Sweeteners,  Coffee Creamer, and "Sugar-free" Products, Lemonade. This will help patient to have more stable blood glucose profile and potentially avoid unintended weight gain.   - I encouraged the patient to switch to  unprocessed or minimally processed complex starch and increased protein intake (animal or plant source), fruits, and vegetables.   - I have approached patient with the following individualized plan to manage diabetes and patient agrees:   -She is advised to restart her CGM.  She remains in the nursing home.  Her insulin regimen was switched to Lantus and NovoLog from U500.   -Due to tightening of fasting glycemia, I readjusted her Lantus to 30 units nightly, NovoLog to 10-16 units 3 times daily AC for Premeal blood glucose readings above 90 mg per DL. - She is warned not to take insulin without monitoring blood glucose properly.   -Patient is encouraged to call clinic for blood glucose levels less than 70 or above 300 mg /dl. - she is adamant saying that she does not tolerate MTF, SGLT2 inhibitors,  And GLP-1 receptor agonist including [place].  Interpretation of CGM printouts discussed with her. - Patient specific target  A1c;  LDL, HDL, Triglycerides were discussed in detail.  2) BP/HTN:  Her blood pressure is controlled to target.   She is advised to take her blood pressure medications in the morning clonidine lisinopril 20 mg daily, carvedilol 25 mg p.o. twice daily, advised on salt  restrictions.     3) Lipids/HPL: Most recent lipid panel showed LDL increasing to 115 from 109.  She is advised to continue Lipitor 20 mg p.o. nightly.  Her next refill will be Lipitor 40 mg p.o. nightly.   Side effects and precautions discussed with her.       4) vitamin D deficiency:  -She is advised to continue vitamin D2 50,000 units weekly in addition to her regular maintenance dose of vitamin D3 5000 units daily.     5)  Weight/Diet:   Her BMI is 51.06  Clearly this is complicating her care for diabetes.  She is a candidate for modest weight loss.  She cannot exercise optimally.  She keeps missing her appointment with  CDE exercise, and carbohydrates information provided.   6) hypothyroidism:  -Her recent thyroid function tests are consistent with inadequate replacement.  She will be continued on her current dose of levothyroxine 150 mg p.o. daily before breakfast.     - We discussed about the correct intake of her thyroid hormone, on empty stomach at fasting, with water, separated by at least 30 minutes from breakfast and other medications,  and separated by more than 4 hours from calcium, iron, multivitamins, acid reflux medications (PPIs). -Patient is made aware of the fact that thyroid hormone replacement is needed for life, dose to be adjusted by periodic monitoring of thyroid function tests.   6) Chronic Care/Health Maintenance:  -Patient is advised to continue lisinopril, Lipitor. She is encouraged to continue to follow up with Ophthalmology, Podiatrist at least yearly or according to recommendations, and advised to stay away from smoking. I have recommended yearly flu vaccine and pneumonia vaccination at least every 5 years; moderate intensity exercise for up to 150 minutes weekly; and  sleep for at least 7 hours a day.   I advised patient to maintain close follow up with their PCP for primary care needs.    I spent 45 minutes in the care of the patient today including  review of labs from [facility], Lipids, Thyroid Function, Hematology (current and previous including abstractions from other facilities); face-to-face time discussing  her blood glucose readings/logs, discussing hypoglycemia and hyperglycemia episodes and symptoms, medications doses, her options of short and long term treatment based on the latest standards of care / guidelines;  discussion about incorporating lifestyle medicine;  and documenting the encounter.    Please refer to Patient Instructions for Blood Glucose Monitoring and Insulin/Medications Dosing Guide"  in media tab for additional information. Please  also refer to " Patient Self Inventory" in the Media  tab for reviewed elements of pertinent patient history.  Michelle Skinner participated in the discussions, expressed understanding, and voiced agreement with the above plans.  All questions were answered to her satisfaction. she is encouraged to contact clinic should she have any questions or concerns prior to her return visit.    Follow up plan: Return in about 10 days (around 10/23/2020) for Bring Meter and Logs- A1c in Office.  Glade Lloyd, MD Phone: [phone]  Fax: [phone]  -  This note was partially dictated with voice recognition software. Similar sounding words can be transcribed inadequately or may not  be corrected upon review.  10/13/2020, 11:55 AM

## 2020-10-16 ENCOUNTER — Ambulatory Visit: Payer: Medicare Other | Admitting: Physician Assistant

## 2020-10-17 DIAGNOSIS — E118 Type 2 diabetes mellitus with unspecified complications: Secondary | ICD-10-CM | POA: Diagnosis not present

## 2020-10-17 DIAGNOSIS — E039 Hypothyroidism, unspecified: Secondary | ICD-10-CM | POA: Diagnosis not present

## 2020-10-17 DIAGNOSIS — M86171 Other acute osteomyelitis, right ankle and foot: Secondary | ICD-10-CM | POA: Diagnosis not present

## 2020-10-17 DIAGNOSIS — I2510 Atherosclerotic heart disease of native coronary artery without angina pectoris: Secondary | ICD-10-CM | POA: Diagnosis not present

## 2020-10-19 ENCOUNTER — Ambulatory Visit (INDEPENDENT_AMBULATORY_CARE_PROVIDER_SITE_OTHER): Payer: Medicare Other | Admitting: Orthopedic Surgery

## 2020-10-19 ENCOUNTER — Encounter: Payer: Self-pay | Admitting: Orthopedic Surgery

## 2020-10-19 ENCOUNTER — Other Ambulatory Visit: Payer: Self-pay

## 2020-10-19 DIAGNOSIS — M86171 Other acute osteomyelitis, right ankle and foot: Secondary | ICD-10-CM | POA: Diagnosis not present

## 2020-10-19 DIAGNOSIS — Z89512 Acquired absence of left leg below knee: Secondary | ICD-10-CM

## 2020-10-19 DIAGNOSIS — Z89511 Acquired absence of right leg below knee: Secondary | ICD-10-CM

## 2020-10-19 DIAGNOSIS — E118 Type 2 diabetes mellitus with unspecified complications: Secondary | ICD-10-CM | POA: Diagnosis not present

## 2020-10-19 DIAGNOSIS — E039 Hypothyroidism, unspecified: Secondary | ICD-10-CM | POA: Diagnosis not present

## 2020-10-19 DIAGNOSIS — S88119A Complete traumatic amputation at level between knee and ankle, unspecified lower leg, initial encounter: Secondary | ICD-10-CM

## 2020-10-23 ENCOUNTER — Encounter: Payer: Self-pay | Admitting: Orthopedic Surgery

## 2020-10-23 DIAGNOSIS — F32A Depression, unspecified: Secondary | ICD-10-CM | POA: Diagnosis not present

## 2020-10-23 DIAGNOSIS — E119 Type 2 diabetes mellitus without complications: Secondary | ICD-10-CM | POA: Diagnosis not present

## 2020-10-23 DIAGNOSIS — E039 Hypothyroidism, unspecified: Secondary | ICD-10-CM | POA: Diagnosis not present

## 2020-10-23 DIAGNOSIS — I10 Essential (primary) hypertension: Secondary | ICD-10-CM | POA: Diagnosis not present

## 2020-10-23 NOTE — Progress Notes (Signed)
Office Visit Note   Patient: Michelle Skinner           Date of Birth: 05-22-1966           MRN: [id number] Visit Date: 10/19/2020              Requested by: Monico Blitz, MD [address] [place],  [place] [address] PCP: Monico Blitz, MD  Chief Complaint  Patient presents with  . Right Knee - Routine Post Op  . Left Knee - Routine Post Op      HPI: Patient is a 55 year old woman status post bilateral below the knee amputation she is about 4 weeks out.  Patient has [facility] currently providing home health services.  She is ambulating in a wheelchair she is currently receiving antibiotics through a PICC line patient states she is changing the dressings daily.  Assessment & Plan: Visit Diagnoses:  1. Below-knee amputation (HCC)     Plan: Recommended packing the wound open on the lateral aspect of the right below the knee amputation.  Change this daily recommended protein supplements twice a day.  Follow-Up Instructions: Return in about 4 weeks (around 11/16/2020).   Ortho Exam  Patient is alert, oriented, no adenopathy, well-dressed, normal affect, normal respiratory effort. Examination the amputations are healing well sutures are harvested today.  She does have a ulcer over the lateral aspect of the right below the knee amputation this is 3 cm in diameter 3 cm deep this has healthy granulation tissue and was packed open.  Patient will change this packing daily.  Imaging: No results found.    Labs: Lab Results  Component Value Date   HGBA1C 8.8 (H) 09/16/2020   HGBA1C 10.1 (H) 07/03/2020   HGBA1C 10.0 (A) 03/06/2020   REPTSTATUS 10/01/2020 FINAL 09/28/2020   GRAMSTAIN  09/10/2020    RARE WBC PRESENT, PREDOMINANTLY PMN ABUNDANT GRAM POSITIVE COCCI RARE GRAM NEGATIVE RODS    CULT (A) 09/28/2020    >=100,000 COLONIES/mL VANCOMYCIN RESISTANT ENTEROCOCCUS ISOLATED   LABORGA VANCOMYCIN RESISTANT ENTEROCOCCUS ISOLATED (A) 09/28/2020     Lab Results  Component Value  Date   ALBUMIN <1.0 (L) 09/17/2020   ALBUMIN <1.0 (L) 09/16/2020   ALBUMIN <1.0 (L) 09/15/2020   PREALBUMIN 11.1 (L) 09/16/2020    Lab Results  Component Value Date   MG 2.2 09/17/2020   MG 1.9 09/16/2020   MG 1.9 09/15/2020   Lab Results  Component Value Date   VD25OH 12 (L) 10/20/2019   VD25OH 22 (L) 04/01/2019   VD25OH 29 (L) 08/17/2018    Lab Results  Component Value Date   PREALBUMIN 11.1 (L) 09/16/2020   CBC EXTENDED Latest Ref Rng & Units 09/28/2020 09/17/2020 09/16/2020  WBC 4.0 - 10.5 K/uL 3.2(L) 11.9(H) 11.0(H)  RBC 3.87 - 5.11 MIL/uL 2.68(L) 2.81(L) 2.91(L)  HGB 12.0 - 15.0 g/dL 7.9(L) 8.2(L) 8.6(L)  HCT 36.0 - 46.0 % 25.4(L) 25.7(L) 26.2(L)  PLT 150 - 400 K/uL 220 308 326  NEUTROABS 1.7 - 7.7 K/uL 2.3 - -  LYMPHSABS 0.7 - 4.0 K/uL 0.6(L) - -     There is no height or weight on file to calculate BMI.  Orders:  No orders of the defined types were placed in this encounter.  No orders of the defined types were placed in this encounter.    Procedures: No procedures performed  Clinical Data: No additional findings.  ROS:  All other systems negative, except as noted in the HPI. Review of Systems  Objective: Vital Signs: There were no vitals taken for this visit.  Specialty Comments:  No specialty comments available.  PMFS History: Patient Active Problem List   Diagnosis Date Noted  . Peripherally inserted central catheter (PICC) in place 10/05/2020  . MSSA bacteremia   . Acute hematogenous osteomyelitis of both feet ([place])   . Severe protein-calorie malnutrition ([place])   . BMI 50.0-59.9, adult ([place])   . Plantar ulcer of right foot ([place])   . Plantar ulcer of left foot ([place])   . Lymphedema   . Sepsis ([place]) 09/06/2020  . Cellulitis 04/27/2020  . AKI (acute kidney injury) ([place]) 04/27/2020  . Stasis dermatitis 04/27/2020  . Anxiety 03/31/2020  . Depression 03/31/2020  . GERD (gastroesophageal reflux disease) 03/31/2020  . Posttraumatic stress  disorder 03/31/2020  . Personal history of noncompliance with medical treatment, presenting hazards to health 02/09/2018  . Abnormal CT of the abdomen 10/20/2017  . Dilated cbd, acquired 10/20/2017  . Essential hypertension, benign 01/14/2017  . Primary hypothyroidism 05/03/2015  . Mixed hyperlipidemia 05/03/2015  . Uncontrolled type 2 diabetes mellitus with complication, with long-term current use of insulin ([place]) 04/25/2015  . Morbid (severe) obesity due to excess calories ([place]) 04/25/2015  . Vitamin D deficiency 04/25/2015  . Cervical cancer ([place]) 10/05/2012   Past Medical History:  Diagnosis Date  . Anemia   . Cervical cancer ([place])   . Depression   . Diabetes mellitus, type II ([place])   . GERD (gastroesophageal reflux disease)   . Hyperlipidemia   . Neuropathy   . PTSD (post-traumatic stress disorder)     Family History  Problem Relation Age of Onset  . Hypertension Mother   . Diabetes Father   . Hyperlipidemia Father   . CAD Father   . Stroke Father   . Osteoporosis Maternal Grandmother   . Cancer Maternal Grandmother   . Hypertension Maternal Grandmother   . Colon cancer Neg Hx     Past Surgical History:  Procedure Laterality Date  . ABDOMINAL HYSTERECTOMY    . AMPUTATION Bilateral 09/13/2020   Procedure: BILATERAL BELOW KNEE AMPUTATIONS;  Surgeon: [name], MD;  Location: [facility];  Service: Orthopedics;  Laterality: Bilateral;  . COLONOSCOPY WITH PROPOFOL N/A 05/25/2018   Procedure: COLONOSCOPY WITH PROPOFOL;  Surgeon: Daneil Dolin, MD;  [facility];  Service: Endoscopy;  Laterality: N/A;  7:30am  . I & D EXTREMITY Bilateral 09/10/2020   Procedure: IRRIGATION AND DEBRIDEMENT FEET;;  Surgeon: [name], MD;  Location: [facility];  Service: Orthopedics;  Laterality: Bilateral;  . IRRIGATION AND DEBRIDEMENT FOOT Bilateral 09/08/2020   Procedure: IRRIGATION AND DEBRIDEMENT FOOT;  Surgeon: Virl Cagey, MD;  [facility];  Service: General;   Laterality: Bilateral;  left foot 11 x 10.5 x 1   . OTHER SURGICAL HISTORY Right    R Foot- I&D  . POLYPECTOMY  05/25/2018   Procedure: POLYPECTOMY;  Surgeon: Daneil Dolin, MD;  [facility];  Service: Endoscopy;;  colon  . UMBILICAL HERNIA REPAIR  2007   Social History   Occupational History  . Not on file  Tobacco Use  . Smoking status: Never Smoker  . Smokeless tobacco: Never Used  Vaping Use  . Vaping Use: Never used  Substance and Sexual Activity  . Alcohol use: No    Alcohol/week: 0.0 standard drinks  . Drug use: No  . Sexual activity: Yes    Birth control/protection: Surgical

## 2020-10-24 ENCOUNTER — Other Ambulatory Visit: Payer: Self-pay

## 2020-10-24 ENCOUNTER — Encounter: Payer: Self-pay | Admitting: "Endocrinology

## 2020-10-24 ENCOUNTER — Ambulatory Visit (INDEPENDENT_AMBULATORY_CARE_PROVIDER_SITE_OTHER): Payer: Medicare Other | Admitting: "Endocrinology

## 2020-10-24 VITALS — BP 156/78 | HR 68

## 2020-10-24 DIAGNOSIS — I10 Essential (primary) hypertension: Secondary | ICD-10-CM

## 2020-10-24 DIAGNOSIS — M86171 Other acute osteomyelitis, right ankle and foot: Secondary | ICD-10-CM | POA: Diagnosis not present

## 2020-10-24 DIAGNOSIS — E559 Vitamin D deficiency, unspecified: Secondary | ICD-10-CM | POA: Diagnosis not present

## 2020-10-24 DIAGNOSIS — Z794 Long term (current) use of insulin: Secondary | ICD-10-CM

## 2020-10-24 DIAGNOSIS — E782 Mixed hyperlipidemia: Secondary | ICD-10-CM | POA: Diagnosis not present

## 2020-10-24 DIAGNOSIS — E118 Type 2 diabetes mellitus with unspecified complications: Secondary | ICD-10-CM

## 2020-10-24 DIAGNOSIS — E039 Hypothyroidism, unspecified: Secondary | ICD-10-CM | POA: Diagnosis not present

## 2020-10-24 DIAGNOSIS — E1165 Type 2 diabetes mellitus with hyperglycemia: Secondary | ICD-10-CM | POA: Diagnosis not present

## 2020-10-24 DIAGNOSIS — I2510 Atherosclerotic heart disease of native coronary artery without angina pectoris: Secondary | ICD-10-CM | POA: Diagnosis not present

## 2020-10-24 DIAGNOSIS — IMO0002 Reserved for concepts with insufficient information to code with codable children: Secondary | ICD-10-CM

## 2020-10-24 DIAGNOSIS — D649 Anemia, unspecified: Secondary | ICD-10-CM | POA: Diagnosis not present

## 2020-10-24 NOTE — Patient Instructions (Signed)

## 2020-10-24 NOTE — Progress Notes (Signed)
10/24/2020  Endocrinology follow-up note  Subjective:    Patient ID: Michelle Skinner, female    DOB: 08-12-1965,    Past Medical History:  Diagnosis Date  . Anemia   . Cervical cancer ([place])   . Depression   . Diabetes mellitus, type II ([place])   . GERD (gastroesophageal reflux disease)   . Hyperlipidemia   . Neuropathy   . PTSD (post-traumatic stress disorder)    Past Surgical History:  Procedure Laterality Date  . ABDOMINAL HYSTERECTOMY    . AMPUTATION Bilateral 09/13/2020   Procedure: BILATERAL BELOW KNEE AMPUTATIONS;  Surgeon: [name], MD;  Location: [place];  Service: Orthopedics;  Laterality: Bilateral;  . COLONOSCOPY WITH PROPOFOL N/A 05/25/2018   Procedure: COLONOSCOPY WITH PROPOFOL;  Surgeon: Daneil Dolin, MD;  [facility];  Service: Endoscopy;  Laterality: N/A;  7:30am  . I & D EXTREMITY Bilateral 09/10/2020   Procedure: IRRIGATION AND DEBRIDEMENT FEET;;  Surgeon: [name], MD;  Location: [place];  Service: Orthopedics;  Laterality: Bilateral;  . IRRIGATION AND DEBRIDEMENT FOOT Bilateral 09/08/2020   Procedure: IRRIGATION AND DEBRIDEMENT FOOT;  Surgeon: Virl Cagey, MD;  Location: [facility];  Service: General;  Laterality: Bilateral;  left foot 11 x 10.5 x 1   . OTHER SURGICAL HISTORY Right    R Foot- I&D  . POLYPECTOMY  05/25/2018   Procedure: POLYPECTOMY;  Surgeon: Daneil Dolin, MD;  [facility];  Service: Endoscopy;;  colon  . UMBILICAL HERNIA REPAIR  2007   Social History   Socioeconomic History  . Marital status: Married    Spouse name: Not on file  . Number of children: Not on file  . Years of education: Not on file  . Highest education level: Not on file  Occupational History  . Not on file  Tobacco Use  . Smoking status: Never Smoker  . Smokeless tobacco: Never Used  Vaping Use  . Vaping Use: Never used  Substance and Sexual Activity  . Alcohol use: No    Alcohol/week: 0.0 standard drinks  . Drug use: No  .  Sexual activity: Yes    Birth control/protection: Surgical  Other Topics Concern  . Not on file  Social History Narrative  . Not on file   Social Determinants of Health   Financial Resource Strain: Not on file  Food Insecurity: Not on file  Transportation Needs: Not on file  Physical Activity: Not on file  Stress: Not on file  Social Connections: Not on file   Outpatient Encounter Medications as of 10/24/2020  Medication Sig  . Ascorbic Acid (VITAMIN C PO) Take 1 tablet by mouth at bedtime.  Marland Kitchen atorvastatin (LIPITOR) 40 MG tablet Take 1 tablet (40 mg total) by mouth at bedtime.  . Calcium Carbonate (CALCIUM 500 PO) Take 1 tablet by mouth daily.  . carvedilol (COREG) 12.5 MG tablet Take 1 tablet (12.5 mg total) by mouth 2 (two) times daily.  . Cholecalciferol (VITAMIN D3) 125 MCG (5000 UT) CAPS Take 1 capsule (5,000 Units total) by mouth daily.  . Continuous Blood Gluc Receiver (FREESTYLE LIBRE 2 READER) DEVI As directed  . Continuous Blood Gluc Sensor (FREESTYLE LIBRE 2 SENSOR) MISC 1 Piece by Does not apply route every 14 (fourteen) days.  Marland Kitchen enoxaparin (LOVENOX) 80 MG/0.8ML injection Inject 0.7 mLs (70 mg total) into the skin daily.  . Ferrous Sulfate (IRON PO) Take 1 tablet by mouth at bedtime.  . Ferrous Sulfate (IRON) 325 (  65 Fe) MG TABS Take 1 tablet by mouth at bedtime.  Marland Kitchen FLUoxetine (PROZAC) 40 MG capsule Take 40 mg by mouth at bedtime.  . folic acid (FOLVITE) 1 MG tablet Take 1 tablet (1 mg total) by mouth daily.  . insulin aspart (NOVOLOG) 100 UNIT/ML injection Inject 10-16 Units into the skin 3 (three) times daily before meals.  . insulin glargine (LANTUS) 100 UNIT/ML injection Inject 0.3 mLs (30 Units total) into the skin at bedtime.  Marland Kitchen levothyroxine (SYNTHROID) 150 MCG tablet Take 1 tablet (150 mcg total) by mouth daily before breakfast.  . lisinopril (ZESTRIL) 20 MG tablet Take 20 mg by mouth at bedtime.  . magnesium citrate SOLN Take 296 mLs (1 Bottle total) by mouth  daily as needed for severe constipation.  . Multiple Vitamins-Minerals (PRESERVISION AREDS 2) CAPS Take 1 capsule by mouth 2 (two) times daily.  Marland Kitchen neomycin-bacitracin-polymyxin (NEOSPORIN) ointment Apply 1 application topically every 12 (twelve) hours. To legs  . nutrition supplement, JUVEN, (JUVEN) PACK Take 1 packet by mouth 2 (two) times daily between meals.  . ondansetron (ZOFRAN) 4 MG tablet Take 4 mg by mouth every 8 (eight) hours as needed for nausea or vomiting.  . Oxycodone HCl 10 MG TABS Take 10 mg by mouth every 6 (six) hours as needed.  . pantoprazole (PROTONIX) 40 MG tablet TAKE ONE (1) TABLET EACH DAY (Patient taking differently: Take 40 mg by mouth at bedtime.)  . polyethylene glycol (MIRALAX / GLYCOLAX) 17 g packet Take 17 g by mouth 2 (two) times daily as needed for mild constipation.  . senna-docusate (SENOKOT-S) 8.6-50 MG tablet Take 1 tablet by mouth 2 (two) times daily as needed for moderate constipation.  . Thiamine HCl (VITAMIN B-1 PO) Take 1 tablet by mouth daily.   No facility-administered encounter medications on file as of 10/24/2020.   ALLERGIES: Allergies  Allergen Reactions  . Sulfa Antibiotics Diarrhea, Nausea And Vomiting and Rash  . Clindamycin/Lincomycin Diarrhea and Nausea And Vomiting  . Invokana [Canagliflozin] Diarrhea and Nausea And Vomiting  . Latex     Rash, itching, over a long period of time  . Tape Itching and Swelling    Reaction after a few days use/ please use paper tape  . Bactrim [Sulfamethoxazole-Trimethoprim] Diarrhea, Nausea And Vomiting and Rash  . Cherry Itching, Swelling and Rash    Throat swelling  . Gabapentin Diarrhea, Nausea And Vomiting and Rash  . Other Itching, Swelling and Rash    Reaction to Hot peppers (throat swelling)   VACCINATION STATUS: Immunization History  Administered Date(s) Administered  . Influenza Split 08/15/2014  . Influenza,inj,Quad PF,6+ Mos 06/10/2017, 04/30/2020  . Tdap 09/02/2014    Diabetes She  presents for her follow-up diabetic visit. She has type 2 diabetes mellitus. Onset time: She was diagnosed at approximate age of 57 years. 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 There are no hypoglycemic associated symptoms. Pertinent negatives for hypoglycemia include no confusion, headaches, pallor or seizures. Associated symptoms include fatigue, polydipsia and polyuria. Pertinent negatives for diabetes include no chest pain and no polyphagia. There are no hypoglycemic complications. Symptoms are improving. Diabetic complications include PVD and retinopathy. Pertinent negatives for diabetic complications include no autonomic neuropathy or peripheral neuropathy. (She has bilateral Charcot's feet, bilateral lower extremity lymphedema.) Risk factors for coronary artery disease include diabetes mellitus, dyslipidemia, hypertension, obesity and sedentary  lifestyle. Current diabetic treatment includes intensive insulin program. She is compliant with treatment most of the time. Her weight is fluctuating minimally. She is following a generally unhealthy diet. She has not had a previous visit with a dietitian (She missed her appointment.). She never participates in exercise. She monitors blood glucose at home 3-4 x per day. Her home blood glucose trend is decreasing steadily. Her breakfast blood glucose range is generally 110-130 mg/dl. Her lunch blood glucose range is generally 130-140 mg/dl. Her dinner blood glucose range is generally 130-140 mg/dl. Her bedtime blood glucose range is generally 130-140 mg/dl. Her overall blood glucose range is 130-140 mg/dl. (She returns with continued improvement in her glycemic profile.  Her recent A1c of 8.8%.  She recently underwent bilateral below-knee amputation from complicated  cellulitis/osteomyelitis.  She is currently recovering in nursing home.  Her insulin regimen was switched from insulin U500 to Lantus and NovoLog.    ) An ACE inhibitor/angiotensin II receptor blocker is not being taken. Eye exam is current.  Hyperlipidemia This is a chronic problem. The current episode started more than 1 year ago. The problem is uncontrolled. Recent lipid tests were reviewed and are high. Exacerbating diseases include diabetes, hypothyroidism and obesity. Pertinent negatives include no chest pain, myalgias or shortness of breath. She is currently on no antihyperlipidemic treatment.  Thyroid Problem Presents for initial visit. Symptoms include fatigue. Patient reports no cold intolerance, diarrhea, heat intolerance or palpitations. The following procedures have not been performed: radioiodine uptake scan and thyroidectomy. Her past medical history is significant for diabetes and hyperlipidemia.    Review of systems   Objective:    BP (!) 156/78   Pulse 68   Wt Readings from Last 3 Encounters:  10/09/20 (!) 326 lb (147.9 kg)  09/28/20 (!) 326 lb 8 oz (148.1 kg)  09/13/20 (!) 326 lb 8 oz (148.1 kg)     Physical Exam- Limited  Constitutional: On recliner.  She is status post bilateral below-knee amputation.   Psych: Reluctant affect.  CMP Latest Ref Rng & Units 09/28/2020 09/17/2020 09/16/2020  Glucose 70 - 99 mg/dL 127(H) 170(H) 165(H)  BUN 6 - 20 mg/dL 15 20 26(H)  Creatinine 0.44 - 1.00 mg/dL 0.70 0.85 1.03(H)  Sodium 135 - 145 mmol/L 136 136 137  Potassium 3.5 - 5.1 mmol/L 3.3(L) 4.8 4.4  Chloride 98 - 111 mmol/L 102 105 105  CO2 22 - 32 mmol/L 26 28 27   Calcium 8.9 - 10.3 mg/dL 7.8(L) 7.6(L) 7.7(L)  Total Protein 6.5 - 8.1 g/dL - - -  Total Bilirubin 0.3 - 1.2 mg/dL - - -  Alkaline Phos 38 - 126 U/L - - -  AST 15 - 41 U/L - - -  ALT 0 - 44 U/L - - -      Diabetic Labs (most recent): Lab Results  Component Value Date   HGBA1C 8.8 (H) 09/16/2020    HGBA1C 10.1 (H) 07/03/2020   HGBA1C 10.0 (A) 03/06/2020    Lipid Panel     Component Value Date/Time   CHOL 188 07/03/2020 1001   TRIG 192 (H) 07/03/2020 1001   HDL 42 (L) 07/03/2020 1001   CHOLHDL 4.5 07/03/2020 1001   VLDL 42 (H) 04/25/2015 1001   LDLCALC 115 (H) 07/03/2020 1001    Assessment & Plan:   1. Uncontrolled type 2 diabetes mellitus with complication, with long-term current use of insulin ([place])   Her diabetes is complicated by bilateral peripheral neuropathy , lateral Charcot's feet status  post bilateral below-knee amputation due to complicated cellulitis/osteomyelitis, history of noncompliance/nonadherence, and possible retinopathy.  - She has chronically uncontrolled symptomatic type 2 DM of approximately since age 50 years duration.  She returns with continued improvement in her glycemic profile.  Her recent A1c of 8.8%.  She recently underwent bilateral below-knee amputation from complicated cellulitis/osteomyelitis.  She is currently recovering in nursing home.   -She remains at extremely high risk for more acute and chronic complications of diabetes which include CAD, CVA, CKD, retinopathy, and neuropathy. These are all discussed in detail with the patient.   - I have counseled the patient on diet management and weight loss, by adopting a carbohydrate restricted/protein rich diet.  - Patient is advised to stick to a routine mealtimes to eat 3 meals  a day and avoid unnecessary snacks ( to snack only to correct hypoglycemia).   - she acknowledges that there is a room for improvement in her food and drink choices. - Suggestion is made for her to avoid simple carbohydrates  from her diet including Cakes, Sweet Desserts, Ice Cream, Soda (diet and regular), Sweet Tea, Candies, Chips, Cookies, Store Bought Juices, Alcohol in Excess of  1-2 drinks a day, Artificial Sweeteners,  Coffee Creamer, and "Sugar-free" Products, Lemonade. This will help patient to have more stable  blood glucose profile and potentially avoid unintended weight gain.  - I encouraged the patient to switch to  unprocessed or minimally processed complex starch and increased protein intake (animal or plant source), fruits, and vegetables.   - I have approached patient with the following individualized plan to manage diabetes and patient agrees:   -She is advised to restart her CGM.  She remains in the nursing home.  She will be continued on Lantus and NovoLog instead of insulin U500.     -Due to tightening of fasting glycemia, will be continued on lower dose of Lantus at 30 units nightly, NovoLog 10-16 units 3 times daily AC for Premeal blood glucose readings above 90 mg per DL. - She is warned not to take insulin without monitoring blood glucose properly.   -Patient is encouraged to call clinic for blood glucose levels less than 70 or above 300 mg /dl. - she is adamant saying that she does not tolerate MTF, SGLT2 inhibitors,  And GLP-1 receptor agonist including [place].  Interpretation of CGM printouts discussed with her. - Patient specific target  A1c;  LDL, HDL, Triglycerides were discussed in detail.  2) BP/HTN:  -Her blood pressure is not controlled to target, however better than before.   She is advised to take her blood pressure medications in the morning .  Advised to continue lisinopril 20 mg daily, carvedilol 25 mg p.o. twice daily, advised on salt restrictions.     3) Lipids/HPL: Most recent lipid panel showed LDL increasing to 115 from 109.  Her Lipitor was increased recently.  She is advised to continue Lipitor 40 mg p.o. nightly.   Side effects and precautions discussed with her.       4) vitamin D deficiency:  -She is advised to continue vitamin D2 50,000 units weekly in addition to her regular maintenance dose of vitamin D3 5000 units daily.     5)  Weight/Diet:   Her BMI is 50+ .  Clearly this is complicating her care for diabetes.  She is a candidate for modest weight  loss.  She cannot exercise optimally.  She keeps missing her appointment with  CDE exercise, and carbohydrates information provided.  6) hypothyroidism:  -Her recent thyroid function tests are consistent with inadequate replacement.  She will be continued on her current dose of levothyroxine 150 mg p.o. daily before breakfast.     - We discussed about the correct intake of her thyroid hormone, on empty stomach at fasting, with water, separated by at least 30 minutes from breakfast and other medications,  and separated by more than 4 hours from calcium, iron, multivitamins, acid reflux medications (PPIs). -Patient is made aware of the fact that thyroid hormone replacement is needed for life, dose to be adjusted by periodic monitoring of thyroid function tests.    6) Chronic Care/Health Maintenance:  -Patient is advised to continue lisinopril, Lipitor. She is encouraged to continue to follow up with Ophthalmology, Podiatrist at least yearly or according to recommendations, and advised to stay away from smoking. I have recommended yearly flu vaccine and pneumonia vaccination at least every 5 years; moderate intensity exercise for up to 150 minutes weekly; and  sleep for at least 7 hours a day.   I advised patient to maintain close follow up with their PCP for primary care needs.      I spent 35 minutes in the care of the patient today including review of labs from [facility], Lipids, Thyroid Function, Hematology (current and previous including abstractions from other facilities); face-to-face time discussing  her blood glucose readings/logs, discussing hypoglycemia and hyperglycemia episodes and symptoms, medications doses, her options of short and long term treatment based on the latest standards of care / guidelines;  discussion about incorporating lifestyle medicine;  and documenting the encounter.    Please refer to Patient Instructions for Blood Glucose Monitoring and Insulin/Medications Dosing  Guide"  in media tab for additional information. Please  also refer to " Patient Self Inventory" in the Media  tab for reviewed elements of pertinent patient history.  Michelle Skinner participated in the discussions, expressed understanding, and voiced agreement with the above plans.  All questions were answered to her satisfaction. she is encouraged to contact clinic should she have any questions or concerns prior to her return visit.    Follow up plan: Return in about 4 months (around 02/23/2021) for F/U with Pre-visit Labs, Meter, Logs, A1c here.Glade Lloyd, MD Phone: [phone]  Fax: [phone]  -  This note was partially dictated with voice recognition software. Similar sounding words can be transcribed inadequately or may not  be corrected upon review.  10/24/2020, 12:58 PM

## 2020-10-25 ENCOUNTER — Encounter: Payer: Self-pay | Admitting: Family

## 2020-10-25 ENCOUNTER — Ambulatory Visit (INDEPENDENT_AMBULATORY_CARE_PROVIDER_SITE_OTHER): Payer: Medicare Other | Admitting: Family

## 2020-10-25 VITALS — BP 174/72 | HR 66 | Temp 97.5°F

## 2020-10-25 DIAGNOSIS — M86071 Acute hematogenous osteomyelitis, right ankle and foot: Secondary | ICD-10-CM

## 2020-10-25 DIAGNOSIS — M86072 Acute hematogenous osteomyelitis, left ankle and foot: Secondary | ICD-10-CM | POA: Diagnosis not present

## 2020-10-25 DIAGNOSIS — Z452 Encounter for adjustment and management of vascular access device: Secondary | ICD-10-CM | POA: Diagnosis not present

## 2020-10-25 NOTE — Assessment & Plan Note (Signed)
PICC line appears without evidence of infection. Antibiotic therapy has been completed and orders sent to remove PICC line.

## 2020-10-25 NOTE — Assessment & Plan Note (Signed)
Ms. Verville has completed 6 weeks of Cefazolin and appears to be healing with despite a small ulcer on the right side with good granulation tissue. No evidence of infection. Will arrange for PICC line removal. Continue wound care per Dr. Sharol Given and follow up with ID as needed.

## 2020-10-25 NOTE — Patient Instructions (Signed)
Nice to see you.  We will get your PICC line removed as you have completed antibiotics.   Continue with wound care and follow up with Dr. Sharol Given as scheduled.   Follow up with ID on as needed basis.   Have a great day and stay safe!

## 2020-10-25 NOTE — Progress Notes (Signed)
Subjective:    Patient ID: Michelle Skinner, female    DOB: 14-May-1966, 55 y.o.   MRN: [id number]  No chief complaint on file.    HPI:  Michelle Skinner is a 55 y.o. female with MSSA bacteremia and poly microbial wound infection with Stenotrophomonas and Enterobacter s/p bilateral below knee amputation last seen on 3/30 for hospitalization follow up with decision to continue Cefazolin for 2 additional weeks as she was having dehiscence at her right stump site. Seen by Dr. Sharol Given on 4/14 and continues to have an ulcer over the lateral aspect of the right below knee amputation that is 3 cm in diameter and 3 cm deep with healthy granulation tissue. Here today for routine follow up.   Michelle Skinner has completed her Cefazolin treatment with no adverse side effects or missed doses. PICC line remains without evidence of infection and functioning appropriately. Continues to have some pain on the right side where the ulcer is located. Denies fevers and chills. Working on increasing mobility.    Allergies  Allergen Reactions  . Sulfa Antibiotics Diarrhea, Nausea And Vomiting and Rash  . Clindamycin/Lincomycin Diarrhea and Nausea And Vomiting  . Invokana [Canagliflozin] Diarrhea and Nausea And Vomiting  . Latex     Rash, itching, over a long period of time  . Tape Itching and Swelling    Reaction after a few days use/ please use paper tape  . Bactrim [Sulfamethoxazole-Trimethoprim] Diarrhea, Nausea And Vomiting and Rash  . Cherry Itching, Swelling and Rash    Throat swelling  . Gabapentin Diarrhea, Nausea And Vomiting and Rash  . Other Itching, Swelling and Rash    Reaction to Hot peppers (throat swelling)      Outpatient Medications Prior to Visit  Medication Sig Dispense Refill  . Ascorbic Acid (VITAMIN C PO) Take 1 tablet by mouth at bedtime.    Marland Kitchen atorvastatin (LIPITOR) 40 MG tablet Take 1 tablet (40 mg total) by mouth at bedtime. 90 tablet 1  . Calcium Carbonate (CALCIUM 500 PO) Take 1  tablet by mouth daily.    . carvedilol (COREG) 12.5 MG tablet Take 1 tablet (12.5 mg total) by mouth 2 (two) times daily. 60 tablet 11  . Cholecalciferol (VITAMIN D3) 125 MCG (5000 UT) CAPS Take 1 capsule (5,000 Units total) by mouth daily. 90 capsule 0  . Continuous Blood Gluc Receiver (FREESTYLE LIBRE 2 READER) DEVI As directed 1 each 0  . Continuous Blood Gluc Sensor (FREESTYLE LIBRE 2 SENSOR) MISC 1 Piece by Does not apply route every 14 (fourteen) days. 2 each 3  . enoxaparin (LOVENOX) 80 MG/0.8ML injection Inject 0.7 mLs (70 mg total) into the skin daily.    . Ferrous Sulfate (IRON PO) Take 1 tablet by mouth at bedtime.    . Ferrous Sulfate (IRON) 325 (65 Fe) MG TABS Take 1 tablet by mouth at bedtime.    Marland Kitchen FLUoxetine (PROZAC) 40 MG capsule Take 40 mg by mouth at bedtime.    . folic acid (FOLVITE) 1 MG tablet Take 1 tablet (1 mg total) by mouth daily.    . insulin aspart (NOVOLOG) 100 UNIT/ML injection Inject 10-16 Units into the skin 3 (three) times daily before meals. 10 mL 2  . insulin glargine (LANTUS) 100 UNIT/ML injection Inject 0.3 mLs (30 Units total) into the skin at bedtime. 10 mL 3  . levothyroxine (SYNTHROID) 150 MCG tablet Take 1 tablet (150 mcg total) by mouth daily before breakfast. 90 tablet 1  . lisinopril (  ZESTRIL) 20 MG tablet Take 20 mg by mouth at bedtime.    . magnesium citrate SOLN Take 296 mLs (1 Bottle total) by mouth daily as needed for severe constipation. 195 mL   . Multiple Vitamins-Minerals (PRESERVISION AREDS 2) CAPS Take 1 capsule by mouth 2 (two) times daily.    Marland Kitchen neomycin-bacitracin-polymyxin (NEOSPORIN) ointment Apply 1 application topically every 12 (twelve) hours. To legs    . nutrition supplement, JUVEN, (JUVEN) PACK Take 1 packet by mouth 2 (two) times daily between meals.  0  . ondansetron (ZOFRAN) 4 MG tablet Take 4 mg by mouth every 8 (eight) hours as needed for nausea or vomiting.    . Oxycodone HCl 10 MG TABS Take 10 mg by mouth every 6 (six) hours  as needed.    . pantoprazole (PROTONIX) 40 MG tablet TAKE ONE (1) TABLET EACH DAY (Patient taking differently: Take 40 mg by mouth at bedtime.) 90 tablet 0  . polyethylene glycol (MIRALAX / GLYCOLAX) 17 g packet Take 17 g by mouth 2 (two) times daily as needed for mild constipation. 14 each 0  . senna-docusate (SENOKOT-S) 8.6-50 MG tablet Take 1 tablet by mouth 2 (two) times daily as needed for moderate constipation.    . Thiamine HCl (VITAMIN B-1 PO) Take 1 tablet by mouth daily.     No facility-administered medications prior to visit.     Past Medical History:  Diagnosis Date  . Anemia   . Cervical cancer ([place])   . Depression   . Diabetes mellitus, type II ([place])   . GERD (gastroesophageal reflux disease)   . Hyperlipidemia   . Neuropathy   . PTSD (post-traumatic stress disorder)      Past Surgical History:  Procedure Laterality Date  . ABDOMINAL HYSTERECTOMY    . AMPUTATION Bilateral 09/13/2020   Procedure: BILATERAL BELOW KNEE AMPUTATIONS;  Surgeon: [name], MD;  Location: [facility];  Service: Orthopedics;  Laterality: Bilateral;  . COLONOSCOPY WITH PROPOFOL N/A 05/25/2018   Procedure: COLONOSCOPY WITH PROPOFOL;  Surgeon: Daneil Dolin, MD;  [facility];  Service: Endoscopy;  Laterality: N/A;  7:30am  . I & D EXTREMITY Bilateral 09/10/2020   Procedure: IRRIGATION AND DEBRIDEMENT FEET;;  Surgeon: [name], MD;  Location: [facility];  Service: Orthopedics;  Laterality: Bilateral;  . IRRIGATION AND DEBRIDEMENT FOOT Bilateral 09/08/2020   Procedure: IRRIGATION AND DEBRIDEMENT FOOT;  Surgeon: Virl Cagey, MD;  [facility];  Service: General;  Laterality: Bilateral;  left foot 11 x 10.5 x 1   . OTHER SURGICAL HISTORY Right    R Foot- I&D  . POLYPECTOMY  05/25/2018   Procedure: POLYPECTOMY;  Surgeon: Daneil Dolin, MD;  [facility];  Service: Endoscopy;;  colon  . UMBILICAL HERNIA REPAIR  2007       Review of Systems  Constitutional:  Negative for chills, diaphoresis, fatigue and fever.  Respiratory: Negative for cough, chest tightness, shortness of breath and wheezing.   Cardiovascular: Negative for chest pain.  Gastrointestinal: Negative for abdominal pain, diarrhea, nausea and vomiting.      Objective:    BP (!) 174/72   Pulse 66   Temp (!) 97.5 F (36.4 C) (Oral)   SpO2 97%  Nursing note and vital signs reviewed.  Physical Exam Constitutional:      General: She is not in acute distress.    Appearance: She is well-developed.     Comments: Seated in the wheelchair; pleasant.  Cardiovascular:     Rate and Rhythm: Normal rate and regular rhythm.     Heart sounds: Normal heart sounds.  Pulmonary:     Effort: Pulmonary effort is normal.     Breath sounds: Normal breath sounds.  Musculoskeletal:     Comments: Bilateral BKA wrapped with ace wraps. Some redness around left stump.   Skin:    General: Skin is warm and dry.  Neurological:     Mental Status: She is alert and oriented to person, place, and time.  Psychiatric:        Mood and Affect: Mood normal.      Depression screen [facility] 2/9 10/04/2020 02/09/2018 04/17/2017 01/14/2017 10/02/2016  Decreased Interest 0 0 0 0 0  Down, Depressed, Hopeless 2 0 0 0 0  PHQ - 2 Score 2 0 0 0 0  Altered sleeping - - - - -  Tired, decreased energy - - - - -  Change in appetite - - - - -  Feeling bad or failure about yourself  - - - - -  Trouble concentrating - - - - -  Moving slowly or fidgety/restless - - - - -  Suicidal thoughts - - - - -  PHQ-9 Score - - - - -  Difficult doing work/chores - - - - -       Assessment & Plan:    Patient Active Problem List   Diagnosis Date Noted  . Peripherally inserted central catheter (PICC) in place 10/05/2020  . MSSA bacteremia   . Acute hematogenous osteomyelitis of both feet ([place])   . Severe protein-calorie malnutrition ([place])   . BMI 50.0-59.9, adult ([place])   . Plantar ulcer of right foot ([place])   . Plantar ulcer of  left foot ([place])   . Lymphedema   . Sepsis ([place]) 09/06/2020  . Cellulitis 04/27/2020  . AKI (acute kidney injury) ([place]) 04/27/2020  . Stasis dermatitis 04/27/2020  . Anxiety 03/31/2020  . Depression 03/31/2020  . GERD (gastroesophageal reflux disease) 03/31/2020  . Posttraumatic stress disorder 03/31/2020  . Personal history of noncompliance with medical treatment, presenting hazards to health 02/09/2018  . Abnormal CT of the abdomen 10/20/2017  . Dilated cbd, acquired 10/20/2017  . Essential hypertension, benign 01/14/2017  . Primary hypothyroidism 05/03/2015  . Mixed hyperlipidemia 05/03/2015  . Uncontrolled type 2 diabetes mellitus with complication, with long-term current use of insulin ([place]) 04/25/2015  . Morbid (severe) obesity due to excess calories ([place]) 04/25/2015  . Vitamin D deficiency 04/25/2015  . Cervical cancer ([place]) 10/05/2012     Problem List Items Addressed This Visit      Musculoskeletal and Integument   Acute hematogenous osteomyelitis of both feet ([place])    Michelle Skinner has completed 6 weeks of Cefazolin and appears to be healing with despite a small ulcer on the right side with good granulation tissue. No evidence of infection. Will arrange for PICC line removal. Continue wound care per Dr. Sharol Given and follow up with ID as needed.         Other   Peripherally inserted central catheter (PICC) in place    PICC line appears without evidence of infection. Antibiotic therapy has been completed and orders sent to remove PICC line.           I am having Michelle Skinner maintain her Vitamin D3, pantoprazole, FreeStyle Libre 2 Sensor, [employer] 2 Reader, neomycin-bacitracin-polymyxin, Thiamine HCl (VITAMIN B-1 PO), Ascorbic Acid (VITAMIN C PO), FLUoxetine, levothyroxine, atorvastatin, lisinopril,  PreserVision AREDS 2, Ferrous Sulfate (IRON PO), enoxaparin, folic acid, magnesium citrate, nutrition supplement (JUVEN), polyethylene glycol, senna-docusate, carvedilol,  insulin glargine, insulin aspart, Calcium Carbonate (CALCIUM 500 PO), Iron, Oxycodone HCl, and ondansetron.   Follow-up: Return if symptoms worsen or fail to improve.   Terri Piedra, MSN, FNP-C Nurse Practitioner [facility] [place] number: [phone]

## 2020-10-26 ENCOUNTER — Ambulatory Visit (INDEPENDENT_AMBULATORY_CARE_PROVIDER_SITE_OTHER): Payer: Medicare Other | Admitting: Orthopedic Surgery

## 2020-10-26 DIAGNOSIS — S88119A Complete traumatic amputation at level between knee and ankle, unspecified lower leg, initial encounter: Secondary | ICD-10-CM

## 2020-10-26 DIAGNOSIS — Z89512 Acquired absence of left leg below knee: Secondary | ICD-10-CM

## 2020-10-26 DIAGNOSIS — Z89511 Acquired absence of right leg below knee: Secondary | ICD-10-CM

## 2020-10-27 ENCOUNTER — Encounter: Payer: Self-pay | Admitting: Orthopedic Surgery

## 2020-10-27 NOTE — Progress Notes (Signed)
Office Visit Note   Patient: Michelle Skinner           Date of Birth: 08/11/1965           MRN: [id number] Visit Date: 10/26/2020              Requested by: Monico Blitz, MD [address] [place],  [place] [address] PCP: Monico Blitz, MD  Chief Complaint  Patient presents with  . Right Leg - Routine Post Op    09/13/20 bilateral BKA  . Left Leg - Routine Post Op      HPI: Patient is a 55 year old woman who presents almost 6 weeks out from bilateral below-knee amputations.  The left residual limb is well-healed without ulcers.  Patient has persistent ulceration laterally over the right below-knee amputation.  Patient is currently a resident at skilled nursing.  Assessment & Plan: Visit Diagnoses:  1. Below-knee amputation (HCC)     Plan: Continue with packing the wound open with 4 x 4 gauze and changing this daily.  Patient continues to show slow steady improvement in the wound healing.  Follow-Up Instructions: Return in about 4 weeks (around 11/23/2020).   Ortho Exam  Patient is alert, oriented, no adenopathy, well-dressed, normal affect, normal respiratory effort. Examination the left below-knee amputation is well-healed there is no redness no cellulitis no signs of infection.  The right leg has a persistent ulcer laterally over the residual limb.  After informed consent a 10 blade knife was used to remove the eschar and nonviable tissue the wound has approximately 100% healthy granulation tissue the wound is 4 cm in diameter and 2 cm deep.  No exposed bone or tendon no drainage.  Imaging: No results found. No images are attached to the encounter.  Labs: Lab Results  Component Value Date   HGBA1C 8.8 (H) 09/16/2020   HGBA1C 10.1 (H) 07/03/2020   HGBA1C 10.0 (A) 03/06/2020   REPTSTATUS 10/01/2020 FINAL 09/28/2020   GRAMSTAIN  09/10/2020    RARE WBC PRESENT, PREDOMINANTLY PMN ABUNDANT GRAM POSITIVE COCCI RARE GRAM NEGATIVE RODS    CULT (A) 09/28/2020    >=100,000  COLONIES/mL VANCOMYCIN RESISTANT ENTEROCOCCUS ISOLATED   LABORGA VANCOMYCIN RESISTANT ENTEROCOCCUS ISOLATED (A) 09/28/2020     Lab Results  Component Value Date   ALBUMIN <1.0 (L) 09/17/2020   ALBUMIN <1.0 (L) 09/16/2020   ALBUMIN <1.0 (L) 09/15/2020   PREALBUMIN 11.1 (L) 09/16/2020    Lab Results  Component Value Date   MG 2.2 09/17/2020   MG 1.9 09/16/2020   MG 1.9 09/15/2020   Lab Results  Component Value Date   VD25OH 12 (L) 10/20/2019   VD25OH 22 (L) 04/01/2019   VD25OH 29 (L) 08/17/2018    Lab Results  Component Value Date   PREALBUMIN 11.1 (L) 09/16/2020   CBC EXTENDED Latest Ref Rng & Units 09/28/2020 09/17/2020 09/16/2020  WBC 4.0 - 10.5 K/uL 3.2(L) 11.9(H) 11.0(H)  RBC 3.87 - 5.11 MIL/uL 2.68(L) 2.81(L) 2.91(L)  HGB 12.0 - 15.0 g/dL 7.9(L) 8.2(L) 8.6(L)  HCT 36.0 - 46.0 % 25.4(L) 25.7(L) 26.2(L)  PLT 150 - 400 K/uL 220 308 326  NEUTROABS 1.7 - 7.7 K/uL 2.3 - -  LYMPHSABS 0.7 - 4.0 K/uL 0.6(L) - -     There is no height or weight on file to calculate BMI.  Orders:  No orders of the defined types were placed in this encounter.  No orders of the defined types were placed in this encounter.    Procedures:  No procedures performed  Clinical Data: No additional findings.  ROS:  All other systems negative, except as noted in the HPI. Review of Systems  Objective: Vital Signs: There were no vitals taken for this visit.  Specialty Comments:  No specialty comments available.  PMFS History: Patient Active Problem List   Diagnosis Date Noted  . Peripherally inserted central catheter (PICC) in place 10/05/2020  . MSSA bacteremia   . Acute hematogenous osteomyelitis of both feet ([place])   . Severe protein-calorie malnutrition ([place])   . BMI 50.0-59.9, adult ([place])   . Plantar ulcer of right foot ([place])   . Plantar ulcer of left foot ([place])   . Lymphedema   . Sepsis ([place]) 09/06/2020  . Cellulitis 04/27/2020  . AKI (acute kidney injury) ([place]) 04/27/2020   . Stasis dermatitis 04/27/2020  . Anxiety 03/31/2020  . Depression 03/31/2020  . GERD (gastroesophageal reflux disease) 03/31/2020  . Posttraumatic stress disorder 03/31/2020  . Personal history of noncompliance with medical treatment, presenting hazards to health 02/09/2018  . Abnormal CT of the abdomen 10/20/2017  . Dilated cbd, acquired 10/20/2017  . Essential hypertension, benign 01/14/2017  . Primary hypothyroidism 05/03/2015  . Mixed hyperlipidemia 05/03/2015  . Uncontrolled type 2 diabetes mellitus with complication, with long-term current use of insulin ([place]) 04/25/2015  . Morbid (severe) obesity due to excess calories ([place]) 04/25/2015  . Vitamin D deficiency 04/25/2015  . Cervical cancer ([place]) 10/05/2012   Past Medical History:  Diagnosis Date  . Anemia   . Cervical cancer ([place])   . Depression   . Diabetes mellitus, type II ([place])   . GERD (gastroesophageal reflux disease)   . Hyperlipidemia   . Neuropathy   . PTSD (post-traumatic stress disorder)     Family History  Problem Relation Age of Onset  . Hypertension Mother   . Diabetes Father   . Hyperlipidemia Father   . CAD Father   . Stroke Father   . Osteoporosis Maternal Grandmother   . Cancer Maternal Grandmother   . Hypertension Maternal Grandmother   . Colon cancer Neg Hx     Past Surgical History:  Procedure Laterality Date  . ABDOMINAL HYSTERECTOMY    . AMPUTATION Bilateral 09/13/2020   Procedure: BILATERAL BELOW KNEE AMPUTATIONS;  Surgeon: [name], MD;  Location: [facility];  Service: Orthopedics;  Laterality: Bilateral;  . COLONOSCOPY WITH PROPOFOL N/A 05/25/2018   Procedure: COLONOSCOPY WITH PROPOFOL;  Surgeon: Daneil Dolin, MD;  [facility];  Service: Endoscopy;  Laterality: N/A;  7:30am  . I & D EXTREMITY Bilateral 09/10/2020   Procedure: IRRIGATION AND DEBRIDEMENT FEET;;  Surgeon: [name], MD;  Location: [facility];  Service: Orthopedics;  Laterality: Bilateral;  . IRRIGATION AND  DEBRIDEMENT FOOT Bilateral 09/08/2020   Procedure: IRRIGATION AND DEBRIDEMENT FOOT;  Surgeon: Virl Cagey, MD;  [facility];  Service: General;  Laterality: Bilateral;  left foot 11 x 10.5 x 1   . OTHER SURGICAL HISTORY Right    R Foot- I&D  . POLYPECTOMY  05/25/2018   Procedure: POLYPECTOMY;  Surgeon: Daneil Dolin, MD;  [facility];  Service: Endoscopy;;  colon  . UMBILICAL HERNIA REPAIR  2007   Social History   Occupational History  . Not on file  Tobacco Use  . Smoking status: Never Smoker  . Smokeless tobacco: Never Used  Vaping Use  . Vaping Use: Never used  Substance and Sexual Activity  . Alcohol use: No  Alcohol/week: 0.0 standard drinks  . Drug use: No  . Sexual activity: Yes    Birth control/protection: Surgical

## 2020-10-31 DIAGNOSIS — R2689 Other abnormalities of gait and mobility: Secondary | ICD-10-CM | POA: Diagnosis not present

## 2020-10-31 DIAGNOSIS — M6281 Muscle weakness (generalized): Secondary | ICD-10-CM | POA: Diagnosis not present

## 2020-10-31 DIAGNOSIS — M86171 Other acute osteomyelitis, right ankle and foot: Secondary | ICD-10-CM | POA: Diagnosis not present

## 2020-10-31 DIAGNOSIS — M86172 Other acute osteomyelitis, left ankle and foot: Secondary | ICD-10-CM | POA: Diagnosis not present

## 2020-10-31 DIAGNOSIS — R278 Other lack of coordination: Secondary | ICD-10-CM | POA: Diagnosis not present

## 2020-11-01 DIAGNOSIS — M86171 Other acute osteomyelitis, right ankle and foot: Secondary | ICD-10-CM | POA: Diagnosis not present

## 2020-11-01 DIAGNOSIS — M6281 Muscle weakness (generalized): Secondary | ICD-10-CM | POA: Diagnosis not present

## 2020-11-01 DIAGNOSIS — R2689 Other abnormalities of gait and mobility: Secondary | ICD-10-CM | POA: Diagnosis not present

## 2020-11-01 DIAGNOSIS — R278 Other lack of coordination: Secondary | ICD-10-CM | POA: Diagnosis not present

## 2020-11-01 DIAGNOSIS — M86172 Other acute osteomyelitis, left ankle and foot: Secondary | ICD-10-CM | POA: Diagnosis not present

## 2020-11-02 DIAGNOSIS — R2689 Other abnormalities of gait and mobility: Secondary | ICD-10-CM | POA: Diagnosis not present

## 2020-11-02 DIAGNOSIS — M6281 Muscle weakness (generalized): Secondary | ICD-10-CM | POA: Diagnosis not present

## 2020-11-02 DIAGNOSIS — Z89512 Acquired absence of left leg below knee: Secondary | ICD-10-CM | POA: Diagnosis not present

## 2020-11-02 DIAGNOSIS — Z89511 Acquired absence of right leg below knee: Secondary | ICD-10-CM | POA: Diagnosis not present

## 2020-11-02 DIAGNOSIS — M86171 Other acute osteomyelitis, right ankle and foot: Secondary | ICD-10-CM | POA: Diagnosis not present

## 2020-11-02 DIAGNOSIS — M86172 Other acute osteomyelitis, left ankle and foot: Secondary | ICD-10-CM | POA: Diagnosis not present

## 2020-11-02 DIAGNOSIS — E119 Type 2 diabetes mellitus without complications: Secondary | ICD-10-CM | POA: Diagnosis not present

## 2020-11-02 DIAGNOSIS — T8130XA Disruption of wound, unspecified, initial encounter: Secondary | ICD-10-CM | POA: Diagnosis not present

## 2020-11-02 DIAGNOSIS — R278 Other lack of coordination: Secondary | ICD-10-CM | POA: Diagnosis not present

## 2020-11-03 DIAGNOSIS — R278 Other lack of coordination: Secondary | ICD-10-CM | POA: Diagnosis not present

## 2020-11-03 DIAGNOSIS — M86172 Other acute osteomyelitis, left ankle and foot: Secondary | ICD-10-CM | POA: Diagnosis not present

## 2020-11-03 DIAGNOSIS — R2689 Other abnormalities of gait and mobility: Secondary | ICD-10-CM | POA: Diagnosis not present

## 2020-11-03 DIAGNOSIS — M6281 Muscle weakness (generalized): Secondary | ICD-10-CM | POA: Diagnosis not present

## 2020-11-03 DIAGNOSIS — M86171 Other acute osteomyelitis, right ankle and foot: Secondary | ICD-10-CM | POA: Diagnosis not present

## 2020-11-04 DIAGNOSIS — R1010 Upper abdominal pain, unspecified: Secondary | ICD-10-CM | POA: Diagnosis not present

## 2020-11-04 DIAGNOSIS — M86171 Other acute osteomyelitis, right ankle and foot: Secondary | ICD-10-CM | POA: Diagnosis not present

## 2020-11-04 DIAGNOSIS — M86172 Other acute osteomyelitis, left ankle and foot: Secondary | ICD-10-CM | POA: Diagnosis not present

## 2020-11-04 DIAGNOSIS — R278 Other lack of coordination: Secondary | ICD-10-CM | POA: Diagnosis not present

## 2020-11-04 DIAGNOSIS — M6281 Muscle weakness (generalized): Secondary | ICD-10-CM | POA: Diagnosis not present

## 2020-11-04 DIAGNOSIS — R2689 Other abnormalities of gait and mobility: Secondary | ICD-10-CM | POA: Diagnosis not present

## 2020-11-06 ENCOUNTER — Telehealth: Payer: Self-pay

## 2020-11-06 DIAGNOSIS — R895 Abnormal microbiological findings in specimens from other organs, systems and tissues: Secondary | ICD-10-CM | POA: Diagnosis not present

## 2020-11-06 DIAGNOSIS — M86171 Other acute osteomyelitis, right ankle and foot: Secondary | ICD-10-CM | POA: Diagnosis not present

## 2020-11-06 DIAGNOSIS — M6281 Muscle weakness (generalized): Secondary | ICD-10-CM | POA: Diagnosis not present

## 2020-11-06 DIAGNOSIS — T8130XA Disruption of wound, unspecified, initial encounter: Secondary | ICD-10-CM | POA: Diagnosis not present

## 2020-11-06 DIAGNOSIS — R2689 Other abnormalities of gait and mobility: Secondary | ICD-10-CM | POA: Diagnosis not present

## 2020-11-06 DIAGNOSIS — M86172 Other acute osteomyelitis, left ankle and foot: Secondary | ICD-10-CM | POA: Diagnosis not present

## 2020-11-06 DIAGNOSIS — R278 Other lack of coordination: Secondary | ICD-10-CM | POA: Diagnosis not present

## 2020-11-06 DIAGNOSIS — Z89511 Acquired absence of right leg below knee: Secondary | ICD-10-CM | POA: Diagnosis not present

## 2020-11-06 DIAGNOSIS — I10 Essential (primary) hypertension: Secondary | ICD-10-CM | POA: Diagnosis not present

## 2020-11-06 DIAGNOSIS — E119 Type 2 diabetes mellitus without complications: Secondary | ICD-10-CM | POA: Diagnosis not present

## 2020-11-06 NOTE — Telephone Encounter (Signed)
Called and sw nurse appt sch with Dr. Sharol Given for tomorrow and will attach lab results to pt's chart.

## 2020-11-06 NOTE — Telephone Encounter (Signed)
[place] with [facility] wanted to let Dr. Sharol Given know that patient tested positive for Staph of right BKA and would like to know how he would like to proceed with wound?  CB# [phone].  Please advise.  Thank you.

## 2020-11-07 ENCOUNTER — Ambulatory Visit (INDEPENDENT_AMBULATORY_CARE_PROVIDER_SITE_OTHER): Payer: Medicare Other | Admitting: Orthopedic Surgery

## 2020-11-07 DIAGNOSIS — R2689 Other abnormalities of gait and mobility: Secondary | ICD-10-CM | POA: Diagnosis not present

## 2020-11-07 DIAGNOSIS — M6281 Muscle weakness (generalized): Secondary | ICD-10-CM | POA: Diagnosis not present

## 2020-11-07 DIAGNOSIS — Z89511 Acquired absence of right leg below knee: Secondary | ICD-10-CM

## 2020-11-07 DIAGNOSIS — M86172 Other acute osteomyelitis, left ankle and foot: Secondary | ICD-10-CM | POA: Diagnosis not present

## 2020-11-07 DIAGNOSIS — R278 Other lack of coordination: Secondary | ICD-10-CM | POA: Diagnosis not present

## 2020-11-07 DIAGNOSIS — M86171 Other acute osteomyelitis, right ankle and foot: Secondary | ICD-10-CM | POA: Diagnosis not present

## 2020-11-07 DIAGNOSIS — Z89512 Acquired absence of left leg below knee: Secondary | ICD-10-CM

## 2020-11-08 ENCOUNTER — Telehealth: Payer: Self-pay

## 2020-11-08 ENCOUNTER — Encounter: Payer: Self-pay | Admitting: Orthopedic Surgery

## 2020-11-08 DIAGNOSIS — M86171 Other acute osteomyelitis, right ankle and foot: Secondary | ICD-10-CM | POA: Diagnosis not present

## 2020-11-08 DIAGNOSIS — R2689 Other abnormalities of gait and mobility: Secondary | ICD-10-CM | POA: Diagnosis not present

## 2020-11-08 DIAGNOSIS — M86172 Other acute osteomyelitis, left ankle and foot: Secondary | ICD-10-CM | POA: Diagnosis not present

## 2020-11-08 DIAGNOSIS — M6281 Muscle weakness (generalized): Secondary | ICD-10-CM | POA: Diagnosis not present

## 2020-11-08 DIAGNOSIS — R278 Other lack of coordination: Secondary | ICD-10-CM | POA: Diagnosis not present

## 2020-11-08 NOTE — Progress Notes (Signed)
Office Visit Note   Patient: Michelle Skinner           Date of Birth: September 11, 1965           MRN: [id number] Visit Date: 11/07/2020              Requested by: Monico Blitz, MD [address] [place],  [place] [address] PCP: Monico Blitz, MD  Chief Complaint  Patient presents with  . Right Leg - Routine Post Op    09/13/20 bilateral BKA   . Left Leg - Routine Post Op      HPI: Patient is a 55 year old woman who is approximately 7 weeks status post bilateral transtibial amputations.  Patient is currently in skilled nursing a culture swab was obtained of the superficial ulcer.  This was positive for staph patient was on antibiotics that was discontinued on April 18..  Assessment & Plan: Visit Diagnoses:  1. S/P bilateral BKA (below knee amputation) ([place])     Plan: Orders were written to wash the wound with Dial soap and water daily apply Silvadene and a dry dressing change daily.  Follow-Up Instructions: Return in about 3 weeks (around 11/28/2020).   Ortho Exam  Patient is alert, oriented, no adenopathy, well-dressed, normal affect, normal respiratory effort. Examination patient is sweating she states that she got her insulin but has not eaten.  She was given juice and crackers.  Patient states she has had some GI upset for the past 8 days with decreased oral intake.  Examination the left transtibial amputation is completely healed no redness no cellulitis no signs of infection.  Examination of the right transtibial amputation there is an open wound laterally that is 3 cm in diameter 2 cm deep there is good granulation tissue approximately 75% of the wound.  There is no purulent drainage no cellulitis no clinical signs of infection.  Imaging: No results found. No images are attached to the encounter.  Labs: Lab Results  Component Value Date   HGBA1C 8.8 (H) 09/16/2020   HGBA1C 10.1 (H) 07/03/2020   HGBA1C 10.0 (A) 03/06/2020   REPTSTATUS 10/01/2020 FINAL 09/28/2020   GRAMSTAIN   09/10/2020    RARE WBC PRESENT, PREDOMINANTLY PMN ABUNDANT GRAM POSITIVE COCCI RARE GRAM NEGATIVE RODS    CULT (A) 09/28/2020    >=100,000 COLONIES/mL VANCOMYCIN RESISTANT ENTEROCOCCUS ISOLATED   LABORGA VANCOMYCIN RESISTANT ENTEROCOCCUS ISOLATED (A) 09/28/2020     Lab Results  Component Value Date   ALBUMIN <1.0 (L) 09/17/2020   ALBUMIN <1.0 (L) 09/16/2020   ALBUMIN <1.0 (L) 09/15/2020   PREALBUMIN 11.1 (L) 09/16/2020    Lab Results  Component Value Date   MG 2.2 09/17/2020   MG 1.9 09/16/2020   MG 1.9 09/15/2020   Lab Results  Component Value Date   VD25OH 12 (L) 10/20/2019   VD25OH 22 (L) 04/01/2019   VD25OH 29 (L) 08/17/2018    Lab Results  Component Value Date   PREALBUMIN 11.1 (L) 09/16/2020   CBC EXTENDED Latest Ref Rng & Units 09/28/2020 09/17/2020 09/16/2020  WBC 4.0 - 10.5 K/uL 3.2(L) 11.9(H) 11.0(H)  RBC 3.87 - 5.11 MIL/uL 2.68(L) 2.81(L) 2.91(L)  HGB 12.0 - 15.0 g/dL 7.9(L) 8.2(L) 8.6(L)  HCT 36.0 - 46.0 % 25.4(L) 25.7(L) 26.2(L)  PLT 150 - 400 K/uL 220 308 326  NEUTROABS 1.7 - 7.7 K/uL 2.3 - -  LYMPHSABS 0.7 - 4.0 K/uL 0.6(L) - -     There is no height or weight on file to calculate BMI.  Orders:  No orders of the defined types were placed in this encounter.  No orders of the defined types were placed in this encounter.    Procedures: No procedures performed  Clinical Data: No additional findings.  ROS:  All other systems negative, except as noted in the HPI. Review of Systems  Objective: Vital Signs: There were no vitals taken for this visit.  Specialty Comments:  No specialty comments available.  PMFS History: Patient Active Problem List   Diagnosis Date Noted  . Peripherally inserted central catheter (PICC) in place 10/05/2020  . MSSA bacteremia   . Acute hematogenous osteomyelitis of both feet ([place])   . Severe protein-calorie malnutrition ([place])   . BMI 50.0-59.9, adult ([place])   . Plantar ulcer of right foot ([place])   .  Plantar ulcer of left foot ([place])   . Lymphedema   . Sepsis ([place]) 09/06/2020  . Cellulitis 04/27/2020  . AKI (acute kidney injury) ([place]) 04/27/2020  . Stasis dermatitis 04/27/2020  . Anxiety 03/31/2020  . Depression 03/31/2020  . GERD (gastroesophageal reflux disease) 03/31/2020  . Posttraumatic stress disorder 03/31/2020  . Personal history of noncompliance with medical treatment, presenting hazards to health 02/09/2018  . Abnormal CT of the abdomen 10/20/2017  . Dilated cbd, acquired 10/20/2017  . Essential hypertension, benign 01/14/2017  . Primary hypothyroidism 05/03/2015  . Mixed hyperlipidemia 05/03/2015  . Uncontrolled type 2 diabetes mellitus with complication, with long-term current use of insulin ([place]) 04/25/2015  . Morbid (severe) obesity due to excess calories ([place]) 04/25/2015  . Vitamin D deficiency 04/25/2015  . Cervical cancer ([place]) 10/05/2012   Past Medical History:  Diagnosis Date  . Anemia   . Cervical cancer ([place])   . Depression   . Diabetes mellitus, type II ([place])   . GERD (gastroesophageal reflux disease)   . Hyperlipidemia   . Neuropathy   . PTSD (post-traumatic stress disorder)     Family History  Problem Relation Age of Onset  . Hypertension Mother   . Diabetes Father   . Hyperlipidemia Father   . CAD Father   . Stroke Father   . Osteoporosis Maternal Grandmother   . Cancer Maternal Grandmother   . Hypertension Maternal Grandmother   . Colon cancer Neg Hx     Past Surgical History:  Procedure Laterality Date  . ABDOMINAL HYSTERECTOMY    . AMPUTATION Bilateral 09/13/2020   Procedure: BILATERAL BELOW KNEE AMPUTATIONS;  Surgeon: [name], MD;  Location: [facility];  Service: Orthopedics;  Laterality: Bilateral;  . COLONOSCOPY WITH PROPOFOL N/A 05/25/2018   Procedure: COLONOSCOPY WITH PROPOFOL;  Surgeon: Daneil Dolin, MD;  [facility];  Service: Endoscopy;  Laterality: N/A;  7:30am  . I & D EXTREMITY Bilateral 09/10/2020   Procedure:  IRRIGATION AND DEBRIDEMENT FEET;;  Surgeon: [name], MD;  Location: [facility];  Service: Orthopedics;  Laterality: Bilateral;  . IRRIGATION AND DEBRIDEMENT FOOT Bilateral 09/08/2020   Procedure: IRRIGATION AND DEBRIDEMENT FOOT;  Surgeon: Virl Cagey, MD;  [facility];  Service: General;  Laterality: Bilateral;  left foot 11 x 10.5 x 1   . OTHER SURGICAL HISTORY Right    R Foot- I&D  . POLYPECTOMY  05/25/2018   Procedure: POLYPECTOMY;  Surgeon: Daneil Dolin, MD;  [facility];  Service: Endoscopy;;  colon  . UMBILICAL HERNIA REPAIR  2007   Social History   Occupational History  . Not on file  Tobacco Use  . Smoking status:  Never Smoker  . Smokeless tobacco: Never Used  Vaping Use  . Vaping Use: Never used  Substance and Sexual Activity  . Alcohol use: No    Alcohol/week: 0.0 standard drinks  . Drug use: No  . Sexual activity: Yes    Birth control/protection: Surgical

## 2020-11-08 NOTE — Telephone Encounter (Signed)
Wound care nurse from SNF called and advised that the pt has a sulfa allergy and what can they use for dressing change instead of silvadene. Advised per [facility] that they can use santyl for dressings. Will call with any other questions.

## 2020-11-09 DIAGNOSIS — Z79899 Other long term (current) drug therapy: Secondary | ICD-10-CM | POA: Diagnosis not present

## 2020-11-09 DIAGNOSIS — E119 Type 2 diabetes mellitus without complications: Secondary | ICD-10-CM | POA: Diagnosis not present

## 2020-11-09 DIAGNOSIS — R52 Pain, unspecified: Secondary | ICD-10-CM | POA: Diagnosis not present

## 2020-11-09 DIAGNOSIS — R278 Other lack of coordination: Secondary | ICD-10-CM | POA: Diagnosis not present

## 2020-11-09 DIAGNOSIS — R2689 Other abnormalities of gait and mobility: Secondary | ICD-10-CM | POA: Diagnosis not present

## 2020-11-09 DIAGNOSIS — Z89511 Acquired absence of right leg below knee: Secondary | ICD-10-CM | POA: Diagnosis not present

## 2020-11-09 DIAGNOSIS — Z89512 Acquired absence of left leg below knee: Secondary | ICD-10-CM | POA: Diagnosis not present

## 2020-11-09 DIAGNOSIS — M6281 Muscle weakness (generalized): Secondary | ICD-10-CM | POA: Diagnosis not present

## 2020-11-09 DIAGNOSIS — M86171 Other acute osteomyelitis, right ankle and foot: Secondary | ICD-10-CM | POA: Diagnosis not present

## 2020-11-09 DIAGNOSIS — M86172 Other acute osteomyelitis, left ankle and foot: Secondary | ICD-10-CM | POA: Diagnosis not present

## 2020-11-10 DIAGNOSIS — M86171 Other acute osteomyelitis, right ankle and foot: Secondary | ICD-10-CM | POA: Diagnosis not present

## 2020-11-10 DIAGNOSIS — R278 Other lack of coordination: Secondary | ICD-10-CM | POA: Diagnosis not present

## 2020-11-10 DIAGNOSIS — M86172 Other acute osteomyelitis, left ankle and foot: Secondary | ICD-10-CM | POA: Diagnosis not present

## 2020-11-10 DIAGNOSIS — M6281 Muscle weakness (generalized): Secondary | ICD-10-CM | POA: Diagnosis not present

## 2020-11-10 DIAGNOSIS — R2689 Other abnormalities of gait and mobility: Secondary | ICD-10-CM | POA: Diagnosis not present

## 2020-11-12 DIAGNOSIS — G8911 Acute pain due to trauma: Secondary | ICD-10-CM | POA: Diagnosis not present

## 2020-11-13 DIAGNOSIS — F32A Depression, unspecified: Secondary | ICD-10-CM | POA: Diagnosis not present

## 2020-11-13 DIAGNOSIS — R278 Other lack of coordination: Secondary | ICD-10-CM | POA: Diagnosis not present

## 2020-11-13 DIAGNOSIS — Z89512 Acquired absence of left leg below knee: Secondary | ICD-10-CM | POA: Diagnosis not present

## 2020-11-13 DIAGNOSIS — R2689 Other abnormalities of gait and mobility: Secondary | ICD-10-CM | POA: Diagnosis not present

## 2020-11-13 DIAGNOSIS — R52 Pain, unspecified: Secondary | ICD-10-CM | POA: Diagnosis not present

## 2020-11-13 DIAGNOSIS — E119 Type 2 diabetes mellitus without complications: Secondary | ICD-10-CM | POA: Diagnosis not present

## 2020-11-13 DIAGNOSIS — M86171 Other acute osteomyelitis, right ankle and foot: Secondary | ICD-10-CM | POA: Diagnosis not present

## 2020-11-13 DIAGNOSIS — R5381 Other malaise: Secondary | ICD-10-CM | POA: Diagnosis not present

## 2020-11-13 DIAGNOSIS — M86172 Other acute osteomyelitis, left ankle and foot: Secondary | ICD-10-CM | POA: Diagnosis not present

## 2020-11-13 DIAGNOSIS — M6281 Muscle weakness (generalized): Secondary | ICD-10-CM | POA: Diagnosis not present

## 2020-11-14 DIAGNOSIS — M86171 Other acute osteomyelitis, right ankle and foot: Secondary | ICD-10-CM | POA: Diagnosis not present

## 2020-11-14 DIAGNOSIS — M86172 Other acute osteomyelitis, left ankle and foot: Secondary | ICD-10-CM | POA: Diagnosis not present

## 2020-11-14 DIAGNOSIS — M6281 Muscle weakness (generalized): Secondary | ICD-10-CM | POA: Diagnosis not present

## 2020-11-14 DIAGNOSIS — R2689 Other abnormalities of gait and mobility: Secondary | ICD-10-CM | POA: Diagnosis not present

## 2020-11-14 DIAGNOSIS — R278 Other lack of coordination: Secondary | ICD-10-CM | POA: Diagnosis not present

## 2020-11-15 DIAGNOSIS — R278 Other lack of coordination: Secondary | ICD-10-CM | POA: Diagnosis not present

## 2020-11-15 DIAGNOSIS — M86172 Other acute osteomyelitis, left ankle and foot: Secondary | ICD-10-CM | POA: Diagnosis not present

## 2020-11-15 DIAGNOSIS — M86171 Other acute osteomyelitis, right ankle and foot: Secondary | ICD-10-CM | POA: Diagnosis not present

## 2020-11-15 DIAGNOSIS — M6281 Muscle weakness (generalized): Secondary | ICD-10-CM | POA: Diagnosis not present

## 2020-11-15 DIAGNOSIS — R2689 Other abnormalities of gait and mobility: Secondary | ICD-10-CM | POA: Diagnosis not present

## 2020-11-16 DIAGNOSIS — M86172 Other acute osteomyelitis, left ankle and foot: Secondary | ICD-10-CM | POA: Diagnosis not present

## 2020-11-16 DIAGNOSIS — R2689 Other abnormalities of gait and mobility: Secondary | ICD-10-CM | POA: Diagnosis not present

## 2020-11-16 DIAGNOSIS — M6281 Muscle weakness (generalized): Secondary | ICD-10-CM | POA: Diagnosis not present

## 2020-11-16 DIAGNOSIS — R278 Other lack of coordination: Secondary | ICD-10-CM | POA: Diagnosis not present

## 2020-11-16 DIAGNOSIS — M86171 Other acute osteomyelitis, right ankle and foot: Secondary | ICD-10-CM | POA: Diagnosis not present

## 2020-11-17 DIAGNOSIS — M6281 Muscle weakness (generalized): Secondary | ICD-10-CM | POA: Diagnosis not present

## 2020-11-17 DIAGNOSIS — M86172 Other acute osteomyelitis, left ankle and foot: Secondary | ICD-10-CM | POA: Diagnosis not present

## 2020-11-17 DIAGNOSIS — M86171 Other acute osteomyelitis, right ankle and foot: Secondary | ICD-10-CM | POA: Diagnosis not present

## 2020-11-17 DIAGNOSIS — R278 Other lack of coordination: Secondary | ICD-10-CM | POA: Diagnosis not present

## 2020-11-17 DIAGNOSIS — R2689 Other abnormalities of gait and mobility: Secondary | ICD-10-CM | POA: Diagnosis not present

## 2020-11-20 DIAGNOSIS — R52 Pain, unspecified: Secondary | ICD-10-CM | POA: Diagnosis not present

## 2020-11-20 DIAGNOSIS — M6281 Muscle weakness (generalized): Secondary | ICD-10-CM | POA: Diagnosis not present

## 2020-11-20 DIAGNOSIS — Z79899 Other long term (current) drug therapy: Secondary | ICD-10-CM | POA: Diagnosis not present

## 2020-11-20 DIAGNOSIS — E039 Hypothyroidism, unspecified: Secondary | ICD-10-CM | POA: Diagnosis not present

## 2020-11-20 DIAGNOSIS — M86172 Other acute osteomyelitis, left ankle and foot: Secondary | ICD-10-CM | POA: Diagnosis not present

## 2020-11-20 DIAGNOSIS — M86171 Other acute osteomyelitis, right ankle and foot: Secondary | ICD-10-CM | POA: Diagnosis not present

## 2020-11-20 DIAGNOSIS — R5381 Other malaise: Secondary | ICD-10-CM | POA: Diagnosis not present

## 2020-11-20 DIAGNOSIS — R2689 Other abnormalities of gait and mobility: Secondary | ICD-10-CM | POA: Diagnosis not present

## 2020-11-20 DIAGNOSIS — R278 Other lack of coordination: Secondary | ICD-10-CM | POA: Diagnosis not present

## 2020-11-20 DIAGNOSIS — Z89512 Acquired absence of left leg below knee: Secondary | ICD-10-CM | POA: Diagnosis not present

## 2020-11-20 DIAGNOSIS — E119 Type 2 diabetes mellitus without complications: Secondary | ICD-10-CM | POA: Diagnosis not present

## 2020-11-20 DIAGNOSIS — Z89511 Acquired absence of right leg below knee: Secondary | ICD-10-CM | POA: Diagnosis not present

## 2020-11-21 DIAGNOSIS — R52 Pain, unspecified: Secondary | ICD-10-CM | POA: Diagnosis not present

## 2020-11-21 DIAGNOSIS — E039 Hypothyroidism, unspecified: Secondary | ICD-10-CM | POA: Diagnosis not present

## 2020-11-21 DIAGNOSIS — D649 Anemia, unspecified: Secondary | ICD-10-CM | POA: Diagnosis not present

## 2020-11-21 DIAGNOSIS — M6281 Muscle weakness (generalized): Secondary | ICD-10-CM | POA: Diagnosis not present

## 2020-11-21 DIAGNOSIS — M86171 Other acute osteomyelitis, right ankle and foot: Secondary | ICD-10-CM | POA: Diagnosis not present

## 2020-11-21 DIAGNOSIS — R2689 Other abnormalities of gait and mobility: Secondary | ICD-10-CM | POA: Diagnosis not present

## 2020-11-21 DIAGNOSIS — M86172 Other acute osteomyelitis, left ankle and foot: Secondary | ICD-10-CM | POA: Diagnosis not present

## 2020-11-21 DIAGNOSIS — R278 Other lack of coordination: Secondary | ICD-10-CM | POA: Diagnosis not present

## 2020-11-22 DIAGNOSIS — M86172 Other acute osteomyelitis, left ankle and foot: Secondary | ICD-10-CM | POA: Diagnosis not present

## 2020-11-22 DIAGNOSIS — R2689 Other abnormalities of gait and mobility: Secondary | ICD-10-CM | POA: Diagnosis not present

## 2020-11-22 DIAGNOSIS — R278 Other lack of coordination: Secondary | ICD-10-CM | POA: Diagnosis not present

## 2020-11-22 DIAGNOSIS — M6281 Muscle weakness (generalized): Secondary | ICD-10-CM | POA: Diagnosis not present

## 2020-11-22 DIAGNOSIS — M86171 Other acute osteomyelitis, right ankle and foot: Secondary | ICD-10-CM | POA: Diagnosis not present

## 2020-11-23 ENCOUNTER — Ambulatory Visit (INDEPENDENT_AMBULATORY_CARE_PROVIDER_SITE_OTHER): Payer: Medicare Other | Admitting: Physician Assistant

## 2020-11-23 ENCOUNTER — Encounter: Payer: Self-pay | Admitting: Physician Assistant

## 2020-11-23 DIAGNOSIS — M86172 Other acute osteomyelitis, left ankle and foot: Secondary | ICD-10-CM | POA: Diagnosis not present

## 2020-11-23 DIAGNOSIS — M6281 Muscle weakness (generalized): Secondary | ICD-10-CM | POA: Diagnosis not present

## 2020-11-23 DIAGNOSIS — M86171 Other acute osteomyelitis, right ankle and foot: Secondary | ICD-10-CM | POA: Diagnosis not present

## 2020-11-23 DIAGNOSIS — Z89512 Acquired absence of left leg below knee: Secondary | ICD-10-CM

## 2020-11-23 DIAGNOSIS — Z89511 Acquired absence of right leg below knee: Secondary | ICD-10-CM

## 2020-11-23 DIAGNOSIS — R278 Other lack of coordination: Secondary | ICD-10-CM | POA: Diagnosis not present

## 2020-11-23 DIAGNOSIS — R2689 Other abnormalities of gait and mobility: Secondary | ICD-10-CM | POA: Diagnosis not present

## 2020-11-23 NOTE — Progress Notes (Signed)
Office Visit Note   Patient: Michelle Skinner           Date of Birth: 1966/01/26           MRN: [id number] Visit Date: 11/23/2020              Requested by: Monico Blitz, MD [address] [place],  [place] [address] PCP: Monico Blitz, MD  Chief Complaint  Patient presents with  . Right Leg - Routine Post Op    09/13/20 bilateral BKA   . Left Leg - Routine Post Op      HPI: Patient is status post bilateral below-knee amputations.  Her right lower extremity wound has been slow to heal.  Her left is healed quite well.  Assessment & Plan: Visit Diagnoses: No diagnosis found.  Plan: Left below-knee amputation stump is healed right is significantly improved.  Should continue with current dressing changes.  Will need to follow-up in 3 weeks.  I question whether patient would be safe be able to ambulate onto below-knee amputation prosthetics.  I have placed an order for physical therapy at her facility to do a complete assessment with regards to this.  If they cannot we could do this as an outpatient order.  The alternative would be transfer legs in a power chair which may provide her actually with better mobility.  Follow-up in 3 weeks  Follow-Up Instructions: No follow-ups on file.   Ortho Exam  Patient is alert, oriented, no adenopathy, well-dressed, normal affect, normal respiratory effort. Bilateral below-knee amputations left side is completely healed no erythema no cellulitis.  Right side she has 1 small area of hypergranulation tissue and on the very lateral aspect she still has 1 area of wound dehiscence this is about 5 cm long does not probe deeply no surrounding cellulitis no tunneling no foul odor   Imaging: No results found. No images are attached to the encounter.  Labs: Lab Results  Component Value Date   HGBA1C 8.8 (H) 09/16/2020   HGBA1C 10.1 (H) 07/03/2020   HGBA1C 10.0 (A) 03/06/2020   REPTSTATUS 10/01/2020 FINAL 09/28/2020   GRAMSTAIN  09/10/2020    RARE WBC PRESENT,  PREDOMINANTLY PMN ABUNDANT GRAM POSITIVE COCCI RARE GRAM NEGATIVE RODS    CULT (A) 09/28/2020    >=100,000 COLONIES/mL VANCOMYCIN RESISTANT ENTEROCOCCUS ISOLATED   LABORGA VANCOMYCIN RESISTANT ENTEROCOCCUS ISOLATED (A) 09/28/2020     Lab Results  Component Value Date   ALBUMIN <1.0 (L) 09/17/2020   ALBUMIN <1.0 (L) 09/16/2020   ALBUMIN <1.0 (L) 09/15/2020   PREALBUMIN 11.1 (L) 09/16/2020    Lab Results  Component Value Date   MG 2.2 09/17/2020   MG 1.9 09/16/2020   MG 1.9 09/15/2020   Lab Results  Component Value Date   VD25OH 12 (L) 10/20/2019   VD25OH 22 (L) 04/01/2019   VD25OH 29 (L) 08/17/2018    Lab Results  Component Value Date   PREALBUMIN 11.1 (L) 09/16/2020   CBC EXTENDED Latest Ref Rng & Units 09/28/2020 09/17/2020 09/16/2020  WBC 4.0 - 10.5 K/uL 3.2(L) 11.9(H) 11.0(H)  RBC 3.87 - 5.11 MIL/uL 2.68(L) 2.81(L) 2.91(L)  HGB 12.0 - 15.0 g/dL 7.9(L) 8.2(L) 8.6(L)  HCT 36.0 - 46.0 % 25.4(L) 25.7(L) 26.2(L)  PLT 150 - 400 K/uL 220 308 326  NEUTROABS 1.7 - 7.7 K/uL 2.3 - -  LYMPHSABS 0.7 - 4.0 K/uL 0.6(L) - -     There is no height or weight on file to calculate BMI.  Orders:  No orders of the defined types were placed in this encounter.  No orders of the defined types were placed in this encounter.    Procedures: No procedures performed  Clinical Data: No additional findings.  ROS:  All other systems negative, except as noted in the HPI. Review of Systems  Objective: Vital Signs: There were no vitals taken for this visit.  Specialty Comments:  No specialty comments available.  PMFS History: Patient Active Problem List   Diagnosis Date Noted  . Peripherally inserted central catheter (PICC) in place 10/05/2020  . MSSA bacteremia   . Acute hematogenous osteomyelitis of both feet ([place])   . Severe protein-calorie malnutrition ([place])   . BMI 50.0-59.9, adult ([place])   . Plantar ulcer of right foot ([place])   . Plantar ulcer of left foot ([place])   .  Lymphedema   . Sepsis ([place]) 09/06/2020  . Cellulitis 04/27/2020  . AKI (acute kidney injury) ([place]) 04/27/2020  . Stasis dermatitis 04/27/2020  . Anxiety 03/31/2020  . Depression 03/31/2020  . GERD (gastroesophageal reflux disease) 03/31/2020  . Posttraumatic stress disorder 03/31/2020  . Personal history of noncompliance with medical treatment, presenting hazards to health 02/09/2018  . Abnormal CT of the abdomen 10/20/2017  . Dilated cbd, acquired 10/20/2017  . Essential hypertension, benign 01/14/2017  . Primary hypothyroidism 05/03/2015  . Mixed hyperlipidemia 05/03/2015  . Uncontrolled type 2 diabetes mellitus with complication, with long-term current use of insulin ([place]) 04/25/2015  . Morbid (severe) obesity due to excess calories ([place]) 04/25/2015  . Vitamin D deficiency 04/25/2015  . Cervical cancer ([place]) 10/05/2012   Past Medical History:  Diagnosis Date  . Anemia   . Cervical cancer ([place])   . Depression   . Diabetes mellitus, type II ([place])   . GERD (gastroesophageal reflux disease)   . Hyperlipidemia   . Neuropathy   . PTSD (post-traumatic stress disorder)     Family History  Problem Relation Age of Onset  . Hypertension Mother   . Diabetes Father   . Hyperlipidemia Father   . CAD Father   . Stroke Father   . Osteoporosis Maternal Grandmother   . Cancer Maternal Grandmother   . Hypertension Maternal Grandmother   . Colon cancer Neg Hx     Past Surgical History:  Procedure Laterality Date  . ABDOMINAL HYSTERECTOMY    . AMPUTATION Bilateral 09/13/2020   Procedure: BILATERAL BELOW KNEE AMPUTATIONS;  Surgeon: [name], MD;  Location: [facility];  Service: Orthopedics;  Laterality: Bilateral;  . COLONOSCOPY WITH PROPOFOL N/A 05/25/2018   Procedure: COLONOSCOPY WITH PROPOFOL;  Surgeon: Daneil Dolin, MD;  [facility];  Service: Endoscopy;  Laterality: N/A;  7:30am  . I & D EXTREMITY Bilateral 09/10/2020   Procedure: IRRIGATION AND DEBRIDEMENT FEET;;   Surgeon: [name], MD;  Location: [facility];  Service: Orthopedics;  Laterality: Bilateral;  . IRRIGATION AND DEBRIDEMENT FOOT Bilateral 09/08/2020   Procedure: IRRIGATION AND DEBRIDEMENT FOOT;  Surgeon: Virl Cagey, MD;  [facility];  Service: General;  Laterality: Bilateral;  left foot 11 x 10.5 x 1   . OTHER SURGICAL HISTORY Right    R Foot- I&D  . POLYPECTOMY  05/25/2018   Procedure: POLYPECTOMY;  Surgeon: Daneil Dolin, MD;  [facility];  Service: Endoscopy;;  colon  . UMBILICAL HERNIA REPAIR  2007   Social History   Occupational History  . Not on file  Tobacco Use  . Smoking status: Never Smoker  .  Smokeless tobacco: Never Used  Vaping Use  . Vaping Use: Never used  Substance and Sexual Activity  . Alcohol use: No    Alcohol/week: 0.0 standard drinks  . Drug use: No  . Sexual activity: Yes    Birth control/protection: Surgical

## 2020-11-24 DIAGNOSIS — M86171 Other acute osteomyelitis, right ankle and foot: Secondary | ICD-10-CM | POA: Diagnosis not present

## 2020-11-24 DIAGNOSIS — R278 Other lack of coordination: Secondary | ICD-10-CM | POA: Diagnosis not present

## 2020-11-24 DIAGNOSIS — M6281 Muscle weakness (generalized): Secondary | ICD-10-CM | POA: Diagnosis not present

## 2020-11-24 DIAGNOSIS — R2689 Other abnormalities of gait and mobility: Secondary | ICD-10-CM | POA: Diagnosis not present

## 2020-11-24 DIAGNOSIS — M86172 Other acute osteomyelitis, left ankle and foot: Secondary | ICD-10-CM | POA: Diagnosis not present

## 2020-11-27 DIAGNOSIS — M86171 Other acute osteomyelitis, right ankle and foot: Secondary | ICD-10-CM | POA: Diagnosis not present

## 2020-11-27 DIAGNOSIS — R278 Other lack of coordination: Secondary | ICD-10-CM | POA: Diagnosis not present

## 2020-11-27 DIAGNOSIS — M6281 Muscle weakness (generalized): Secondary | ICD-10-CM | POA: Diagnosis not present

## 2020-11-27 DIAGNOSIS — M86172 Other acute osteomyelitis, left ankle and foot: Secondary | ICD-10-CM | POA: Diagnosis not present

## 2020-11-27 DIAGNOSIS — R2689 Other abnormalities of gait and mobility: Secondary | ICD-10-CM | POA: Diagnosis not present

## 2020-11-28 ENCOUNTER — Ambulatory Visit: Payer: Medicare Other | Admitting: Physician Assistant

## 2020-11-28 DIAGNOSIS — R278 Other lack of coordination: Secondary | ICD-10-CM | POA: Diagnosis not present

## 2020-11-28 DIAGNOSIS — M6281 Muscle weakness (generalized): Secondary | ICD-10-CM | POA: Diagnosis not present

## 2020-11-28 DIAGNOSIS — M86172 Other acute osteomyelitis, left ankle and foot: Secondary | ICD-10-CM | POA: Diagnosis not present

## 2020-11-28 DIAGNOSIS — R2689 Other abnormalities of gait and mobility: Secondary | ICD-10-CM | POA: Diagnosis not present

## 2020-11-28 DIAGNOSIS — M86171 Other acute osteomyelitis, right ankle and foot: Secondary | ICD-10-CM | POA: Diagnosis not present

## 2020-11-29 DIAGNOSIS — R278 Other lack of coordination: Secondary | ICD-10-CM | POA: Diagnosis not present

## 2020-11-29 DIAGNOSIS — M86171 Other acute osteomyelitis, right ankle and foot: Secondary | ICD-10-CM | POA: Diagnosis not present

## 2020-11-29 DIAGNOSIS — M86172 Other acute osteomyelitis, left ankle and foot: Secondary | ICD-10-CM | POA: Diagnosis not present

## 2020-11-29 DIAGNOSIS — R2689 Other abnormalities of gait and mobility: Secondary | ICD-10-CM | POA: Diagnosis not present

## 2020-11-29 DIAGNOSIS — M6281 Muscle weakness (generalized): Secondary | ICD-10-CM | POA: Diagnosis not present

## 2020-11-30 DIAGNOSIS — M86172 Other acute osteomyelitis, left ankle and foot: Secondary | ICD-10-CM | POA: Diagnosis not present

## 2020-11-30 DIAGNOSIS — R278 Other lack of coordination: Secondary | ICD-10-CM | POA: Diagnosis not present

## 2020-11-30 DIAGNOSIS — R2689 Other abnormalities of gait and mobility: Secondary | ICD-10-CM | POA: Diagnosis not present

## 2020-11-30 DIAGNOSIS — M6281 Muscle weakness (generalized): Secondary | ICD-10-CM | POA: Diagnosis not present

## 2020-11-30 DIAGNOSIS — M86171 Other acute osteomyelitis, right ankle and foot: Secondary | ICD-10-CM | POA: Diagnosis not present

## 2020-12-01 DIAGNOSIS — M86172 Other acute osteomyelitis, left ankle and foot: Secondary | ICD-10-CM | POA: Diagnosis not present

## 2020-12-01 DIAGNOSIS — R278 Other lack of coordination: Secondary | ICD-10-CM | POA: Diagnosis not present

## 2020-12-01 DIAGNOSIS — M6281 Muscle weakness (generalized): Secondary | ICD-10-CM | POA: Diagnosis not present

## 2020-12-01 DIAGNOSIS — M86171 Other acute osteomyelitis, right ankle and foot: Secondary | ICD-10-CM | POA: Diagnosis not present

## 2020-12-01 DIAGNOSIS — R2689 Other abnormalities of gait and mobility: Secondary | ICD-10-CM | POA: Diagnosis not present

## 2020-12-02 DIAGNOSIS — R2689 Other abnormalities of gait and mobility: Secondary | ICD-10-CM | POA: Diagnosis not present

## 2020-12-02 DIAGNOSIS — R278 Other lack of coordination: Secondary | ICD-10-CM | POA: Diagnosis not present

## 2020-12-02 DIAGNOSIS — M86172 Other acute osteomyelitis, left ankle and foot: Secondary | ICD-10-CM | POA: Diagnosis not present

## 2020-12-02 DIAGNOSIS — M86171 Other acute osteomyelitis, right ankle and foot: Secondary | ICD-10-CM | POA: Diagnosis not present

## 2020-12-02 DIAGNOSIS — M6281 Muscle weakness (generalized): Secondary | ICD-10-CM | POA: Diagnosis not present

## 2020-12-04 DIAGNOSIS — R278 Other lack of coordination: Secondary | ICD-10-CM | POA: Diagnosis not present

## 2020-12-04 DIAGNOSIS — M6281 Muscle weakness (generalized): Secondary | ICD-10-CM | POA: Diagnosis not present

## 2020-12-04 DIAGNOSIS — M86171 Other acute osteomyelitis, right ankle and foot: Secondary | ICD-10-CM | POA: Diagnosis not present

## 2020-12-04 DIAGNOSIS — R2689 Other abnormalities of gait and mobility: Secondary | ICD-10-CM | POA: Diagnosis not present

## 2020-12-04 DIAGNOSIS — M86172 Other acute osteomyelitis, left ankle and foot: Secondary | ICD-10-CM | POA: Diagnosis not present

## 2020-12-05 DIAGNOSIS — M86171 Other acute osteomyelitis, right ankle and foot: Secondary | ICD-10-CM | POA: Diagnosis not present

## 2020-12-05 DIAGNOSIS — R278 Other lack of coordination: Secondary | ICD-10-CM | POA: Diagnosis not present

## 2020-12-05 DIAGNOSIS — M6281 Muscle weakness (generalized): Secondary | ICD-10-CM | POA: Diagnosis not present

## 2020-12-05 DIAGNOSIS — R2689 Other abnormalities of gait and mobility: Secondary | ICD-10-CM | POA: Diagnosis not present

## 2020-12-05 DIAGNOSIS — M86172 Other acute osteomyelitis, left ankle and foot: Secondary | ICD-10-CM | POA: Diagnosis not present

## 2020-12-06 DIAGNOSIS — M86172 Other acute osteomyelitis, left ankle and foot: Secondary | ICD-10-CM | POA: Diagnosis not present

## 2020-12-06 DIAGNOSIS — M86171 Other acute osteomyelitis, right ankle and foot: Secondary | ICD-10-CM | POA: Diagnosis not present

## 2020-12-06 DIAGNOSIS — M6281 Muscle weakness (generalized): Secondary | ICD-10-CM | POA: Diagnosis not present

## 2020-12-06 DIAGNOSIS — R2689 Other abnormalities of gait and mobility: Secondary | ICD-10-CM | POA: Diagnosis not present

## 2020-12-06 DIAGNOSIS — R278 Other lack of coordination: Secondary | ICD-10-CM | POA: Diagnosis not present

## 2020-12-07 DIAGNOSIS — R278 Other lack of coordination: Secondary | ICD-10-CM | POA: Diagnosis not present

## 2020-12-07 DIAGNOSIS — M86172 Other acute osteomyelitis, left ankle and foot: Secondary | ICD-10-CM | POA: Diagnosis not present

## 2020-12-07 DIAGNOSIS — M86171 Other acute osteomyelitis, right ankle and foot: Secondary | ICD-10-CM | POA: Diagnosis not present

## 2020-12-07 DIAGNOSIS — M6281 Muscle weakness (generalized): Secondary | ICD-10-CM | POA: Diagnosis not present

## 2020-12-07 DIAGNOSIS — R2689 Other abnormalities of gait and mobility: Secondary | ICD-10-CM | POA: Diagnosis not present

## 2020-12-08 DIAGNOSIS — M6281 Muscle weakness (generalized): Secondary | ICD-10-CM | POA: Diagnosis not present

## 2020-12-08 DIAGNOSIS — R278 Other lack of coordination: Secondary | ICD-10-CM | POA: Diagnosis not present

## 2020-12-08 DIAGNOSIS — M86171 Other acute osteomyelitis, right ankle and foot: Secondary | ICD-10-CM | POA: Diagnosis not present

## 2020-12-08 DIAGNOSIS — R2689 Other abnormalities of gait and mobility: Secondary | ICD-10-CM | POA: Diagnosis not present

## 2020-12-08 DIAGNOSIS — M86172 Other acute osteomyelitis, left ankle and foot: Secondary | ICD-10-CM | POA: Diagnosis not present

## 2020-12-09 DIAGNOSIS — M86171 Other acute osteomyelitis, right ankle and foot: Secondary | ICD-10-CM | POA: Diagnosis not present

## 2020-12-09 DIAGNOSIS — R2689 Other abnormalities of gait and mobility: Secondary | ICD-10-CM | POA: Diagnosis not present

## 2020-12-09 DIAGNOSIS — M86172 Other acute osteomyelitis, left ankle and foot: Secondary | ICD-10-CM | POA: Diagnosis not present

## 2020-12-09 DIAGNOSIS — M6281 Muscle weakness (generalized): Secondary | ICD-10-CM | POA: Diagnosis not present

## 2020-12-09 DIAGNOSIS — R278 Other lack of coordination: Secondary | ICD-10-CM | POA: Diagnosis not present

## 2020-12-11 DIAGNOSIS — M86171 Other acute osteomyelitis, right ankle and foot: Secondary | ICD-10-CM | POA: Diagnosis not present

## 2020-12-11 DIAGNOSIS — R2689 Other abnormalities of gait and mobility: Secondary | ICD-10-CM | POA: Diagnosis not present

## 2020-12-11 DIAGNOSIS — R278 Other lack of coordination: Secondary | ICD-10-CM | POA: Diagnosis not present

## 2020-12-11 DIAGNOSIS — M6281 Muscle weakness (generalized): Secondary | ICD-10-CM | POA: Diagnosis not present

## 2020-12-11 DIAGNOSIS — M86172 Other acute osteomyelitis, left ankle and foot: Secondary | ICD-10-CM | POA: Diagnosis not present

## 2020-12-12 DIAGNOSIS — M86171 Other acute osteomyelitis, right ankle and foot: Secondary | ICD-10-CM | POA: Diagnosis not present

## 2020-12-12 DIAGNOSIS — R2689 Other abnormalities of gait and mobility: Secondary | ICD-10-CM | POA: Diagnosis not present

## 2020-12-12 DIAGNOSIS — M6281 Muscle weakness (generalized): Secondary | ICD-10-CM | POA: Diagnosis not present

## 2020-12-12 DIAGNOSIS — M86172 Other acute osteomyelitis, left ankle and foot: Secondary | ICD-10-CM | POA: Diagnosis not present

## 2020-12-12 DIAGNOSIS — R278 Other lack of coordination: Secondary | ICD-10-CM | POA: Diagnosis not present

## 2020-12-13 DIAGNOSIS — R2689 Other abnormalities of gait and mobility: Secondary | ICD-10-CM | POA: Diagnosis not present

## 2020-12-13 DIAGNOSIS — M86171 Other acute osteomyelitis, right ankle and foot: Secondary | ICD-10-CM | POA: Diagnosis not present

## 2020-12-13 DIAGNOSIS — R278 Other lack of coordination: Secondary | ICD-10-CM | POA: Diagnosis not present

## 2020-12-13 DIAGNOSIS — M86172 Other acute osteomyelitis, left ankle and foot: Secondary | ICD-10-CM | POA: Diagnosis not present

## 2020-12-13 DIAGNOSIS — M6281 Muscle weakness (generalized): Secondary | ICD-10-CM | POA: Diagnosis not present

## 2020-12-14 ENCOUNTER — Encounter: Payer: Self-pay | Admitting: Orthopedic Surgery

## 2020-12-14 ENCOUNTER — Ambulatory Visit (INDEPENDENT_AMBULATORY_CARE_PROVIDER_SITE_OTHER): Payer: Medicare Other | Admitting: Orthopedic Surgery

## 2020-12-14 DIAGNOSIS — R2689 Other abnormalities of gait and mobility: Secondary | ICD-10-CM | POA: Diagnosis not present

## 2020-12-14 DIAGNOSIS — Z89512 Acquired absence of left leg below knee: Secondary | ICD-10-CM

## 2020-12-14 DIAGNOSIS — Z89511 Acquired absence of right leg below knee: Secondary | ICD-10-CM

## 2020-12-14 DIAGNOSIS — M86172 Other acute osteomyelitis, left ankle and foot: Secondary | ICD-10-CM | POA: Diagnosis not present

## 2020-12-14 DIAGNOSIS — M86171 Other acute osteomyelitis, right ankle and foot: Secondary | ICD-10-CM | POA: Diagnosis not present

## 2020-12-14 DIAGNOSIS — R278 Other lack of coordination: Secondary | ICD-10-CM | POA: Diagnosis not present

## 2020-12-14 DIAGNOSIS — M6281 Muscle weakness (generalized): Secondary | ICD-10-CM | POA: Diagnosis not present

## 2020-12-14 NOTE — Progress Notes (Signed)
Office Visit Note   Patient: Michelle Skinner           Date of Birth: 01/24/1966           MRN: [id number] Visit Date: 12/14/2020              Requested by: Monico Blitz, MD [address] [place],  [place] [address] PCP: Monico Blitz, MD  Chief Complaint  Patient presents with   Left Leg - Routine Post Op    09/13/20 bilateral BKA    Right Leg - Routine Post Op      HPI: Patient is a 55 year old woman who is 3 months status post bilateral transtibial amputations.  She is currently at skilled [facility] for transfers she states her legs feel good and she is ready for prosthesis.  Assessment & Plan: Visit Diagnoses:  1. S/P bilateral BKA (below knee amputation) ([place])     Plan: Patient was given a prescription for [facility] for new shrinkers for both lower extremities she may discontinue dressing changes she is also given a prescription to start the prosthetic fitting process.  Follow-Up Instructions: Return in about 2 months (around 02/13/2021).   Ortho Exam  Patient is alert, oriented, no adenopathy, well-dressed, normal affect, normal respiratory effort. Examination of both lower extremities have completely healed she does have some fibrous tissue beneath the scars but there is no cellulitis no open wounds no drainage no signs of infection.  Imaging: No results found. No images are attached to the encounter.  Labs: Lab Results  Component Value Date   HGBA1C 8.8 (H) 09/16/2020   HGBA1C 10.1 (H) 07/03/2020   HGBA1C 10.0 (A) 03/06/2020   REPTSTATUS 10/01/2020 FINAL 09/28/2020   GRAMSTAIN  09/10/2020    RARE WBC PRESENT, PREDOMINANTLY PMN ABUNDANT GRAM POSITIVE COCCI RARE GRAM NEGATIVE RODS    CULT (A) 09/28/2020    >=100,000 COLONIES/mL VANCOMYCIN RESISTANT ENTEROCOCCUS ISOLATED   LABORGA VANCOMYCIN RESISTANT ENTEROCOCCUS ISOLATED (A) 09/28/2020     Lab Results  Component Value Date   ALBUMIN <1.0 (L) 09/17/2020   ALBUMIN <1.0 (L) 09/16/2020   ALBUMIN <1.0 (L)  09/15/2020   PREALBUMIN 11.1 (L) 09/16/2020    Lab Results  Component Value Date   MG 2.2 09/17/2020   MG 1.9 09/16/2020   MG 1.9 09/15/2020   Lab Results  Component Value Date   VD25OH 12 (L) 10/20/2019   VD25OH 22 (L) 04/01/2019   VD25OH 29 (L) 08/17/2018    Lab Results  Component Value Date   PREALBUMIN 11.1 (L) 09/16/2020   CBC EXTENDED Latest Ref Rng & Units 09/28/2020 09/17/2020 09/16/2020  WBC 4.0 - 10.5 K/uL 3.2(L) 11.9(H) 11.0(H)  RBC 3.87 - 5.11 MIL/uL 2.68(L) 2.81(L) 2.91(L)  HGB 12.0 - 15.0 g/dL 7.9(L) 8.2(L) 8.6(L)  HCT 36.0 - 46.0 % 25.4(L) 25.7(L) 26.2(L)  PLT 150 - 400 K/uL 220 308 326  NEUTROABS 1.7 - 7.7 K/uL 2.3 - -  LYMPHSABS 0.7 - 4.0 K/uL 0.6(L) - -     There is no height or weight on file to calculate BMI.  Orders:  No orders of the defined types were placed in this encounter.  No orders of the defined types were placed in this encounter.    Procedures: No procedures performed  Clinical Data: No additional findings.  ROS:  All other systems negative, except as noted in the HPI. Review of Systems  Objective: Vital Signs: There were no vitals taken for this visit.  Specialty Comments:  No specialty comments available.  PMFS History: Patient Active Problem List   Diagnosis Date Noted   Peripherally inserted central catheter (PICC) in place 10/05/2020   MSSA bacteremia    Acute hematogenous osteomyelitis of both feet ([facility])    Severe protein-calorie malnutrition ([facility])    BMI 50.0-59.9, adult ([place])    Plantar ulcer of right foot ([place])    Plantar ulcer of left foot ([place])    Lymphedema    Sepsis ([place]) 09/06/2020   Cellulitis 04/27/2020   AKI (acute kidney injury) ([place]) 04/27/2020   Stasis dermatitis 04/27/2020   Anxiety 03/31/2020   Depression 03/31/2020   GERD (gastroesophageal reflux disease) 03/31/2020   Posttraumatic stress disorder 03/31/2020   Personal history of noncompliance with medical treatment, presenting hazards to  health 02/09/2018   Abnormal CT of the abdomen 10/20/2017   Dilated cbd, acquired 10/20/2017   Essential hypertension, benign 01/14/2017   Primary hypothyroidism 05/03/2015   Mixed hyperlipidemia 05/03/2015   Uncontrolled type 2 diabetes mellitus with complication, with long-term current use of insulin ([place]) 04/25/2015   Morbid (severe) obesity due to excess calories ([place]) 04/25/2015   Vitamin D deficiency 04/25/2015   Cervical cancer ([place]) 10/05/2012   Past Medical History:  Diagnosis Date   Anemia    Cervical cancer ([place])    Depression    Diabetes mellitus, type II ([facility])    GERD (gastroesophageal reflux disease)    Hyperlipidemia    Neuropathy    PTSD (post-traumatic stress disorder)     Family History  Problem Relation Age of Onset   Hypertension Mother    Diabetes Father    Hyperlipidemia Father    CAD Father    Stroke Father    Osteoporosis Maternal Grandmother    Cancer Maternal Grandmother    Hypertension Maternal Grandmother    Colon cancer Neg Hx     Past Surgical History:  Procedure Laterality Date   ABDOMINAL HYSTERECTOMY     AMPUTATION Bilateral 09/13/2020   Procedure: BILATERAL BELOW KNEE AMPUTATIONS;  Surgeon: [name], MD;  Location: [facility];  Service: Orthopedics;  Laterality: Bilateral;   COLONOSCOPY WITH PROPOFOL N/A 05/25/2018   Procedure: COLONOSCOPY WITH PROPOFOL;  Surgeon: Daneil Dolin, MD;  [facility];  Service: Endoscopy;  Laterality: N/A;  7:30am   I & D EXTREMITY Bilateral 09/10/2020   Procedure: IRRIGATION AND DEBRIDEMENT FEET;;  Surgeon: [name], MD;  Location: [facility];  Service: Orthopedics;  Laterality: Bilateral;   IRRIGATION AND DEBRIDEMENT FOOT Bilateral 09/08/2020   Procedure: IRRIGATION AND DEBRIDEMENT FOOT;  Surgeon: Virl Cagey, MD;  [facility];  Service: General;  Laterality: Bilateral;  left foot 11 x 10.5 x 1    OTHER SURGICAL HISTORY Right    R Foot- I&D   POLYPECTOMY  05/25/2018   Procedure:  POLYPECTOMY;  Surgeon: Daneil Dolin, MD;  [facility];  Service: Endoscopy;;  colon   UMBILICAL HERNIA REPAIR  2007   Social History   Occupational History   Not on file  Tobacco Use   Smoking status: Never   Smokeless tobacco: Never  Vaping Use   Vaping Use: Never used  Substance and Sexual Activity   Alcohol use: No    Alcohol/week: 0.0 standard drinks   Drug use: No   Sexual activity: Yes    Birth control/protection: Surgical

## 2020-12-15 DIAGNOSIS — M86172 Other acute osteomyelitis, left ankle and foot: Secondary | ICD-10-CM | POA: Diagnosis not present

## 2020-12-15 DIAGNOSIS — R2689 Other abnormalities of gait and mobility: Secondary | ICD-10-CM | POA: Diagnosis not present

## 2020-12-15 DIAGNOSIS — M86171 Other acute osteomyelitis, right ankle and foot: Secondary | ICD-10-CM | POA: Diagnosis not present

## 2020-12-15 DIAGNOSIS — M6281 Muscle weakness (generalized): Secondary | ICD-10-CM | POA: Diagnosis not present

## 2020-12-15 DIAGNOSIS — R278 Other lack of coordination: Secondary | ICD-10-CM | POA: Diagnosis not present

## 2020-12-17 ENCOUNTER — Encounter (HOSPITAL_COMMUNITY): Payer: Self-pay | Admitting: Emergency Medicine

## 2020-12-17 ENCOUNTER — Other Ambulatory Visit: Payer: Self-pay

## 2020-12-17 ENCOUNTER — Emergency Department (HOSPITAL_COMMUNITY)
Admission: EM | Admit: 2020-12-17 | Discharge: 2020-12-18 | Disposition: A | Discharge status: Home / Self Care | Payer: Medicare Other | Attending: Emergency Medicine | Admitting: Emergency Medicine

## 2020-12-17 DIAGNOSIS — R1110 Vomiting, unspecified: Secondary | ICD-10-CM | POA: Diagnosis not present

## 2020-12-17 DIAGNOSIS — R9431 Abnormal electrocardiogram [ECG] [EKG]: Secondary | ICD-10-CM | POA: Diagnosis not present

## 2020-12-17 DIAGNOSIS — Z91040 Latex allergy status: Secondary | ICD-10-CM | POA: Insufficient documentation

## 2020-12-17 DIAGNOSIS — R109 Unspecified abdominal pain: Secondary | ICD-10-CM | POA: Diagnosis present

## 2020-12-17 DIAGNOSIS — N12 Tubulo-interstitial nephritis, not specified as acute or chronic: Secondary | ICD-10-CM | POA: Insufficient documentation

## 2020-12-17 DIAGNOSIS — E119 Type 2 diabetes mellitus without complications: Secondary | ICD-10-CM | POA: Insufficient documentation

## 2020-12-17 DIAGNOSIS — Z79899 Other long term (current) drug therapy: Secondary | ICD-10-CM | POA: Diagnosis not present

## 2020-12-17 DIAGNOSIS — I10 Essential (primary) hypertension: Secondary | ICD-10-CM | POA: Diagnosis not present

## 2020-12-17 DIAGNOSIS — Z794 Long term (current) use of insulin: Secondary | ICD-10-CM | POA: Insufficient documentation

## 2020-12-17 DIAGNOSIS — K219 Gastro-esophageal reflux disease without esophagitis: Secondary | ICD-10-CM | POA: Diagnosis not present

## 2020-12-17 DIAGNOSIS — Z8541 Personal history of malignant neoplasm of cervix uteri: Secondary | ICD-10-CM | POA: Diagnosis not present

## 2020-12-17 DIAGNOSIS — R112 Nausea with vomiting, unspecified: Secondary | ICD-10-CM | POA: Diagnosis not present

## 2020-12-17 DIAGNOSIS — E039 Hypothyroidism, unspecified: Secondary | ICD-10-CM | POA: Insufficient documentation

## 2020-12-17 DIAGNOSIS — R110 Nausea: Secondary | ICD-10-CM | POA: Diagnosis not present

## 2020-12-17 DIAGNOSIS — R6889 Other general symptoms and signs: Secondary | ICD-10-CM | POA: Diagnosis not present

## 2020-12-17 DIAGNOSIS — R1111 Vomiting without nausea: Secondary | ICD-10-CM | POA: Diagnosis not present

## 2020-12-17 DIAGNOSIS — Z743 Need for continuous supervision: Secondary | ICD-10-CM | POA: Diagnosis not present

## 2020-12-17 HISTORY — DX: Acquired absence of left leg above knee: Z89.612

## 2020-12-17 HISTORY — DX: Acquired absence of right leg above knee: Z89.611

## 2020-12-17 LAB — CBC WITH DIFFERENTIAL/PLATELET
Abs Immature Granulocytes: 0.06 10*3/uL (ref 0.00–0.07)
Basophils Absolute: 0 10*3/uL (ref 0.0–0.1)
Basophils Relative: 0 %
Eosinophils Absolute: 0 10*3/uL (ref 0.0–0.5)
Eosinophils Relative: 0 %
HCT: 36.7 % (ref 36.0–46.0)
Hemoglobin: 12 g/dL (ref 12.0–15.0)
Immature Granulocytes: 1 %
Lymphocytes Relative: 4 %
Lymphs Abs: 0.4 10*3/uL — ABNORMAL LOW (ref 0.7–4.0)
MCH: 29.7 pg (ref 26.0–34.0)
MCHC: 32.7 g/dL (ref 30.0–36.0)
MCV: 90.8 fL (ref 80.0–100.0)
Monocytes Absolute: 0.6 10*3/uL (ref 0.1–1.0)
Monocytes Relative: 6 %
Neutro Abs: 9 10*3/uL — ABNORMAL HIGH (ref 1.7–7.7)
Neutrophils Relative %: 89 %
Platelets: 173 10*3/uL (ref 150–400)
RBC: 4.04 MIL/uL (ref 3.87–5.11)
RDW: 12.6 % (ref 11.5–15.5)
WBC: 10.1 10*3/uL (ref 4.0–10.5)
nRBC: 0 % (ref 0.0–0.2)

## 2020-12-17 MED ORDER — METOCLOPRAMIDE HCL 5 MG/ML IJ SOLN
10.0000 mg | Freq: Once | INTRAMUSCULAR | Status: AC
  Administered 2020-12-18: 10 mg via INTRAVENOUS
  Filled 2020-12-17: qty 2

## 2020-12-17 MED ORDER — LACTATED RINGERS IV BOLUS
1000.0000 mL | Freq: Once | INTRAVENOUS | Status: AC
  Administered 2020-12-18: 1000 mL via INTRAVENOUS

## 2020-12-17 NOTE — ED Provider Notes (Signed)
[facility] EMERGENCY DEPARTMENT Provider Note   CSN: [id number] Arrival date & time: 12/17/20  2240     History Chief Complaint  Patient presents with   Emesis    Michelle Skinner is a 55 y.o. female.   Emesis Severity:  Mild Duration:  3 days Timing:  Intermittent Number of daily episodes:  Multiple Quality:  Stomach contents Progression:  Unchanged Recent urination:  Normal Context: not post-tussive and not self-induced   Relieved by:  Nothing Ineffective treatments:  Antiemetics Associated symptoms: abdominal pain   Associated symptoms: no cough, no diarrhea, no headaches, no myalgias, no sore throat and no URI   Risk factors: no alcohol use, no diabetes, no prior abdominal surgery, no sick contacts, no suspect food intake and no travel to endemic areas       Past Medical History:  Diagnosis Date   Amputee, above knee, left ([place])    Amputee, above knee, right ([place])    Anemia    Cervical cancer ([place])    Depression    Diabetes mellitus, type II ([place])    GERD (gastroesophageal reflux disease)    Hyperlipidemia    Neuropathy    PTSD (post-traumatic stress disorder)     Patient Active Problem List   Diagnosis Date Noted   Peripherally inserted central catheter (PICC) in place 10/05/2020   MSSA bacteremia    Acute hematogenous osteomyelitis of both feet (HCC)    Severe protein-calorie malnutrition ([place])    BMI 50.0-59.9, adult ([place])    Plantar ulcer of right foot ([place])    Plantar ulcer of left foot ([place])    Lymphedema    Sepsis ([place]) 09/06/2020   Cellulitis 04/27/2020   AKI (acute kidney injury) ([place]) 04/27/2020   Stasis dermatitis 04/27/2020   Anxiety 03/31/2020   Depression 03/31/2020   GERD (gastroesophageal reflux disease) 03/31/2020   Posttraumatic stress disorder 03/31/2020   Personal history of noncompliance with medical treatment, presenting hazards to health 02/09/2018   Abnormal CT of the abdomen 10/20/2017   Dilated cbd, acquired 10/20/2017    Essential hypertension, benign 01/14/2017   Primary hypothyroidism 05/03/2015   Mixed hyperlipidemia 05/03/2015   Diabetes mellitus ([place]) 04/25/2015   Morbid (severe) obesity due to excess calories ([place]) 04/25/2015   Vitamin D deficiency 04/25/2015   Cervical cancer ([place]) 10/05/2012    Past Surgical History:  Procedure Laterality Date   ABDOMINAL HYSTERECTOMY     AMPUTATION Bilateral 09/13/2020   Procedure: BILATERAL BELOW KNEE AMPUTATIONS;  Surgeon: [name], MD;  Location: [place];  Service: Orthopedics;  Laterality: Bilateral;   COLONOSCOPY WITH PROPOFOL N/A 05/25/2018   Procedure: COLONOSCOPY WITH PROPOFOL;  Surgeon: Daneil Dolin, MD;  [facility];  Service: Endoscopy;  Laterality: N/A;  7:30am   I & D EXTREMITY Bilateral 09/10/2020   Procedure: IRRIGATION AND DEBRIDEMENT FEET;;  Surgeon: [name], MD;  Location: [place];  Service: Orthopedics;  Laterality: Bilateral;   IRRIGATION AND DEBRIDEMENT FOOT Bilateral 09/08/2020   Procedure: IRRIGATION AND DEBRIDEMENT FOOT;  Surgeon: Virl Cagey, MD;  Location: [facility];  Service: General;  Laterality: Bilateral;  left foot 11 x 10.5 x 1    OTHER SURGICAL HISTORY Right    R Foot- I&D   POLYPECTOMY  05/25/2018   Procedure: POLYPECTOMY;  Surgeon: Daneil Dolin, MD;  [facility];  Service: Endoscopy;;  colon   UMBILICAL HERNIA REPAIR  2007     OB History   No obstetric history  on file.     Family History  Problem Relation Age of Onset   Hypertension Mother    Diabetes Father    Hyperlipidemia Father    CAD Father    Stroke Father    Osteoporosis Maternal Grandmother    Cancer Maternal Grandmother    Hypertension Maternal Grandmother    Colon cancer Neg Hx     Social History   Tobacco Use   Smoking status: Never   Smokeless tobacco: Never  Vaping Use   Vaping Use: Never used  Substance Use Topics   Alcohol use: No    Alcohol/week: 0.0 standard drinks   Drug use: No    Home  Medications Prior to Admission medications   Medication Sig Start Date End Date Taking? Authorizing Provider  cephALEXin (KEFLEX) 500 MG capsule Take 1 capsule (500 mg total) by mouth 4 (four) times daily. 12/18/20  Yes Keah Lamba, Corene Cornea, MD  promethazine (PHENERGAN) 25 MG suppository Place 1 suppository (25 mg total) rectally every 6 (six) hours as needed for nausea or vomiting. 12/18/20  Yes Natina Wiginton, Corene Cornea, MD  Ascorbic Acid (VITAMIN C PO) Take 1 tablet by mouth at bedtime.    [provider]  atorvastatin (LIPITOR) 40 MG tablet Take 1 tablet (40 mg total) by mouth at bedtime. 07/12/20   Cassandria Anger, MD  Calcium Carbonate (CALCIUM 500 PO) Take 1 tablet by mouth daily.    [provider]  carvedilol (COREG) 12.5 MG tablet Take 1 tablet (12.5 mg total) by mouth 2 (two) times daily. 09/17/20 09/17/21  Mercy Riding, MD  Cholecalciferol (VITAMIN D3) 125 MCG (5000 UT) CAPS Take 1 capsule (5,000 Units total) by mouth daily. 05/13/18   Cassandria Anger, MD  Continuous Blood Gluc Receiver (FREESTYLE LIBRE 2 READER) DEVI As directed 03/06/20   Cassandria Anger, MD  Continuous Blood Gluc Sensor (FREESTYLE LIBRE 2 SENSOR) MISC 1 Piece by Does not apply route every 14 (fourteen) days. 03/06/20   Cassandria Anger, MD  enoxaparin (LOVENOX) 80 MG/0.8ML injection Inject 0.7 mLs (70 mg total) into the skin daily. 09/17/20 10/17/20  Mercy Riding, MD  Ferrous Sulfate (IRON PO) Take 1 tablet by mouth at bedtime.    [provider]  Ferrous Sulfate (IRON) 325 (65 Fe) MG TABS Take 1 tablet by mouth at bedtime.    [provider]  FLUoxetine (PROZAC) 40 MG capsule Take 40 mg by mouth at bedtime.    [provider]  folic acid (FOLVITE) 1 MG tablet Take 1 tablet (1 mg total) by mouth daily. 09/17/20   Mercy Riding, MD  insulin aspart (NOVOLOG) 100 UNIT/ML injection Inject 10-16 Units into the skin 3 (three) times daily before meals. 10/13/20   Cassandria Anger, MD   insulin glargine (LANTUS) 100 UNIT/ML injection Inject 0.3 mLs (30 Units total) into the skin at bedtime. 10/13/20   Cassandria Anger, MD  levothyroxine (SYNTHROID) 150 MCG tablet Take 1 tablet (150 mcg total) by mouth daily before breakfast. 07/12/20   [name], Marella Chimes, MD  lisinopril (ZESTRIL) 20 MG tablet Take 20 mg by mouth at bedtime. 06/29/20   [provider]  magnesium citrate SOLN Take 296 mLs (1 Bottle total) by mouth daily as needed for severe constipation. 09/17/20   Mercy Riding, MD  Multiple Vitamins-Minerals (PRESERVISION AREDS 2) CAPS Take 1 capsule by mouth 2 (two) times daily.    [provider]  neomycin-bacitracin-polymyxin (NEOSPORIN) ointment Apply 1 application topically every  12 (twelve) hours. To legs    [provider]  nutrition supplement, JUVEN, (JUVEN) PACK Take 1 packet by mouth 2 (two) times daily between meals. 09/17/20   Mercy Riding, MD  ondansetron (ZOFRAN) 4 MG tablet Take 4 mg by mouth every 8 (eight) hours as needed for nausea or vomiting.    [provider]  Oxycodone HCl 10 MG TABS Take 10 mg by mouth every 6 (six) hours as needed.    [provider]  pantoprazole (PROTONIX) 40 MG tablet TAKE ONE (1) TABLET EACH DAY Patient taking differently: Take 40 mg by mouth at bedtime. 12/01/18   Cassandria Anger, MD  polyethylene glycol (MIRALAX / GLYCOLAX) 17 g packet Take 17 g by mouth 2 (two) times daily as needed for mild constipation. 09/17/20   Mercy Riding, MD  senna-docusate (SENOKOT-S) 8.6-50 MG tablet Take 1 tablet by mouth 2 (two) times daily as needed for moderate constipation. 09/17/20   Mercy Riding, MD  Thiamine HCl (VITAMIN B-1 PO) Take 1 tablet by mouth daily.    [provider]    Allergies    Sulfa antibiotics, Clindamycin/lincomycin, Invokana [canagliflozin], Latex, Tape, Bactrim [sulfamethoxazole-trimethoprim], Cherry, Gabapentin, and Other  Review of Systems   Review of Systems   HENT:  Negative for sore throat.   Respiratory:  Negative for cough.   Gastrointestinal:  Positive for abdominal pain and vomiting. Negative for diarrhea.  Musculoskeletal:  Negative for myalgias.  Neurological:  Negative for headaches.  All other systems reviewed and are negative.  Physical Exam Updated Vital Signs BP (!) 176/98   Pulse 86   Temp 98.4 F (36.9 C) (Oral)   Resp 18   Ht 5\' 7"  (1.702 m)   Wt (!) 147.9 kg   SpO2 94%   BMI 51.07 kg/m   Physical Exam Vitals and nursing note reviewed.  Constitutional:      Appearance: She is well-developed.  HENT:     Head: Normocephalic and atraumatic.     Nose: Nose normal. No congestion or rhinorrhea.     Mouth/Throat:     Mouth: Mucous membranes are moist.     Pharynx: Oropharynx is clear.  Cardiovascular:     Rate and Rhythm: Normal rate and regular rhythm.  Pulmonary:     Effort: No respiratory distress.     Breath sounds: No stridor.  Abdominal:     General: There is no distension.     Palpations: There is no mass.     Tenderness: There is no abdominal tenderness (mild ttp over epigastric area).  Musculoskeletal:        General: No swelling or tenderness. Normal range of motion.     Cervical back: Normal range of motion.  Skin:    General: Skin is warm and dry.  Neurological:     General: No focal deficit present.     Mental Status: She is alert.    ED Results / Procedures / Treatments   Labs (all labs ordered are listed, but only abnormal results are displayed) Labs Reviewed  CBC WITH DIFFERENTIAL/PLATELET - Abnormal; Notable for the following components:      Result Value   Neutro Abs 9.0 (*)    Lymphs Abs 0.4 (*)    All other components within normal limits  COMPREHENSIVE METABOLIC PANEL - Abnormal; Notable for the following components:   Sodium 133 (*)    Chloride 97 (*)    Glucose, Bld 150 (*)    BUN  38 (*)    Creatinine, Ser 1.19 (*)    Albumin 2.8 (*)    GFR, Estimated 55 (*)    All other  components within normal limits  URINALYSIS, ROUTINE W REFLEX MICROSCOPIC - Abnormal; Notable for the following components:   APPearance HAZY (*)    Glucose, UA 50 (*)    Hgb urine dipstick MODERATE (*)    Ketones, ur 20 (*)    Protein, ur >=300 (*)    Leukocytes,Ua TRACE (*)    RBC / HPF >50 (*)    Bacteria, UA MANY (*)    All other components within normal limits  URINE CULTURE  LIPASE, BLOOD    EKG EKG Interpretation  Date/Time:  Sunday December 17 2020 22:57:11 EDT Ventricular Rate:  77 PR Interval:  160 QRS Duration: 115 QT Interval:  432 QTC Calculation: 489 R Axis:   -59 Text Interpretation: Sinus rhythm Left anterior fascicular block Probable left ventricular hypertrophy Baseline wander in lead(s) II III aVF Since last tracing rate slower Confirmed by Noemi Chapel [id number]) on 12/17/2020 11:02:58 PM  Radiology CT ABDOMEN PELVIS W CONTRAST  Result Date: 12/18/2020 CLINICAL DATA:  Abdominal abscess or infection suspected. Nausea and vomiting for the past 3 days EXAM: CT ABDOMEN AND PELVIS WITH CONTRAST TECHNIQUE: Multidetector CT imaging of the abdomen and pelvis was performed using the standard protocol following bolus administration of intravenous contrast. CONTRAST:  14mL OMNIPAQUE IOHEXOL 300 MG/ML  SOLN COMPARISON:  06/04/2016 FINDINGS: Lower chest:  No acute finding Hepatobiliary: No focal liver abnormality.No evidence of biliary obstruction or stone. Pancreas: Generalized atrophy Spleen: Unremarkable. Adrenals/Urinary Tract: Negative adrenals. Asymmetric right perinephric stranding, although no altered enhancement or urothelial thickening. No hydronephrosis or stone. There is pericystic fat stranding and pneumaturia. Stomach/Bowel:  No obstruction. No appendicitis. Vascular/Lymphatic: No acute vascular abnormality. Scattered atheromatous calcifications. No mass or adenopathy. Reproductive:Hysterectomy. Other: No ascites or pneumoperitoneum. Musculoskeletal: Degenerative changes  without acute finding. IMPRESSION: Finding suggests cystitis and right pyelonephritis. Electronically Signed   By: Monte Fantasia M.D.   On: 12/18/2020 06:57    Procedures Procedures   Medications Ordered in ED Medications  metoCLOPramide (REGLAN) injection 10 mg (10 mg Intravenous Given 12/18/20 0003)  lactated ringers bolus 1,000 mL (0 mLs Intravenous Stopped 12/18/20 0136)  droperidol (INAPSINE) 2.5 MG/ML injection 2.5 mg (2.5 mg Intravenous Given 12/18/20 0319)  labetalol (NORMODYNE) injection 10 mg (10 mg Intravenous Given 12/18/20 0245)  lactated ringers bolus 1,000 mL (0 mLs Intravenous Stopped 12/18/20 0649)  iohexol (OMNIPAQUE) 300 MG/ML solution 100 mL (100 mLs Intravenous Contrast Given 12/18/20 0623)  cefTRIAXone (ROCEPHIN) 2 g in sodium chloride 0.9 % 100 mL IVPB (0 g Intravenous Stopped 12/18/20 0734)    ED Course  I have reviewed the triage vital signs and the nursing notes.  Pertinent labs & imaging results that were available during my care of the patient were reviewed by me and considered in my medical decision making (see chart for details).    MDM Rules/Calculators/A&P                          Ultimately improved symptoms. Found to have a UTI/pyelo. Started abx. Tolerating PO. Not septic, appears stable for discharge to facility with pcp follow up to ensure improvement.   Final Clinical Impression(s) / ED Diagnoses Final diagnoses:  Pyelonephritis    Rx / DC Orders ED Discharge Orders          Ordered  cephALEXin (KEFLEX) 500 MG capsule  4 times daily        12/18/20 0703    promethazine (PHENERGAN) 25 MG suppository  Every 6 hours PRN        12/18/20 0703             Sonya Gunnoe, Corene Cornea, MD 12/19/20 [phone]

## 2020-12-17 NOTE — ED Triage Notes (Signed)
Pt arrives via [facility] from [place] for nausea and vomiting for the past 3 days. Pt states facility has been giving her something for nausea by mouth with no relief.

## 2020-12-18 ENCOUNTER — Emergency Department (HOSPITAL_COMMUNITY): Payer: Medicare Other

## 2020-12-18 DIAGNOSIS — R6889 Other general symptoms and signs: Secondary | ICD-10-CM | POA: Diagnosis not present

## 2020-12-18 DIAGNOSIS — R279 Unspecified lack of coordination: Secondary | ICD-10-CM | POA: Diagnosis not present

## 2020-12-18 DIAGNOSIS — Z79899 Other long term (current) drug therapy: Secondary | ICD-10-CM | POA: Diagnosis not present

## 2020-12-18 DIAGNOSIS — E039 Hypothyroidism, unspecified: Secondary | ICD-10-CM | POA: Diagnosis not present

## 2020-12-18 DIAGNOSIS — Z7401 Bed confinement status: Secondary | ICD-10-CM | POA: Diagnosis not present

## 2020-12-18 DIAGNOSIS — I10 Essential (primary) hypertension: Secondary | ICD-10-CM | POA: Diagnosis not present

## 2020-12-18 DIAGNOSIS — E119 Type 2 diabetes mellitus without complications: Secondary | ICD-10-CM | POA: Diagnosis not present

## 2020-12-18 DIAGNOSIS — Z743 Need for continuous supervision: Secondary | ICD-10-CM | POA: Diagnosis not present

## 2020-12-18 DIAGNOSIS — R112 Nausea with vomiting, unspecified: Secondary | ICD-10-CM | POA: Diagnosis not present

## 2020-12-18 DIAGNOSIS — R1110 Vomiting, unspecified: Secondary | ICD-10-CM | POA: Diagnosis not present

## 2020-12-18 DIAGNOSIS — N12 Tubulo-interstitial nephritis, not specified as acute or chronic: Secondary | ICD-10-CM | POA: Diagnosis not present

## 2020-12-18 LAB — COMPREHENSIVE METABOLIC PANEL
ALT: 18 U/L (ref 0–44)
AST: 15 U/L (ref 15–41)
Albumin: 2.8 g/dL — ABNORMAL LOW (ref 3.5–5.0)
Alkaline Phosphatase: 93 U/L (ref 38–126)
Anion gap: 11 (ref 5–15)
BUN: 38 mg/dL — ABNORMAL HIGH (ref 6–20)
CO2: 25 mmol/L (ref 22–32)
Calcium: 9.1 mg/dL (ref 8.9–10.3)
Chloride: 97 mmol/L — ABNORMAL LOW (ref 98–111)
Creatinine, Ser: 1.19 mg/dL — ABNORMAL HIGH (ref 0.44–1.00)
GFR, Estimated: 55 mL/min — ABNORMAL LOW (ref >60)
Glucose, Bld: 150 mg/dL — ABNORMAL HIGH (ref 70–99)
Potassium: 3.5 mmol/L (ref 3.5–5.1)
Sodium: 133 mmol/L — ABNORMAL LOW (ref 135–145)
Total Bilirubin: 1.1 mg/dL (ref 0.3–1.2)
Total Protein: 6.9 g/dL (ref 6.5–8.1)

## 2020-12-18 LAB — URINALYSIS, ROUTINE W REFLEX MICROSCOPIC
Bilirubin Urine: NEGATIVE
Glucose, UA: 50 mg/dL — AB
Ketones, ur: 20 mg/dL — AB
Nitrite: NEGATIVE
Protein, ur: >=300 mg/dL — AB
RBC / HPF: >50 RBC/hpf — ABNORMAL HIGH (ref 0–5)
Specific Gravity, Urine: 1.018 (ref 1.005–1.030)
pH: 5 (ref 5.0–8.0)

## 2020-12-18 LAB — LIPASE, BLOOD: Lipase: 20 U/L (ref 11–51)

## 2020-12-18 MED ORDER — LABETALOL HCL 5 MG/ML IV SOLN
10.0000 mg | Freq: Once | INTRAVENOUS | Status: AC
  Administered 2020-12-18: 10 mg via INTRAVENOUS
  Filled 2020-12-18: qty 4

## 2020-12-18 MED ORDER — SODIUM CHLORIDE 0.9 % IV SOLN
2.0000 g | Freq: Once | INTRAVENOUS | Status: AC
  Administered 2020-12-18: 2 g via INTRAVENOUS
  Filled 2020-12-18: qty 20

## 2020-12-18 MED ORDER — PROMETHAZINE HCL 25 MG RE SUPP: 25.0000 mg | Freq: Four times a day (QID) | RECTAL | 0 refills | Status: DC | PRN

## 2020-12-18 MED ORDER — DROPERIDOL 2.5 MG/ML IJ SOLN
2.5000 mg | Freq: Once | INTRAMUSCULAR | Status: AC
  Administered 2020-12-18: 2.5 mg via INTRAVENOUS
  Filled 2020-12-18: qty 2

## 2020-12-18 MED ORDER — LACTATED RINGERS IV BOLUS
1000.0000 mL | Freq: Once | INTRAVENOUS | Status: AC
  Administered 2020-12-18: 1000 mL via INTRAVENOUS

## 2020-12-18 MED ORDER — CEPHALEXIN 500 MG PO CAPS: 500.0000 mg | ORAL_CAPSULE | Freq: Four times a day (QID) | ORAL | 0 refills | Status: DC

## 2020-12-18 MED ORDER — IOHEXOL 300 MG/ML  SOLN
100.0000 mL | Freq: Once | INTRAMUSCULAR | Status: AC | PRN
  Administered 2020-12-18: 100 mL via INTRAVENOUS

## 2020-12-19 ENCOUNTER — Encounter (HOSPITAL_COMMUNITY): Payer: Self-pay | Admitting: Physical Therapy

## 2020-12-21 DIAGNOSIS — E119 Type 2 diabetes mellitus without complications: Secondary | ICD-10-CM | POA: Diagnosis not present

## 2020-12-21 DIAGNOSIS — Z89511 Acquired absence of right leg below knee: Secondary | ICD-10-CM | POA: Diagnosis not present

## 2020-12-21 DIAGNOSIS — E039 Hypothyroidism, unspecified: Secondary | ICD-10-CM | POA: Diagnosis not present

## 2020-12-21 DIAGNOSIS — F32A Depression, unspecified: Secondary | ICD-10-CM | POA: Diagnosis not present

## 2020-12-21 DIAGNOSIS — I10 Essential (primary) hypertension: Secondary | ICD-10-CM | POA: Diagnosis not present

## 2020-12-21 DIAGNOSIS — E785 Hyperlipidemia, unspecified: Secondary | ICD-10-CM | POA: Diagnosis not present

## 2020-12-21 DIAGNOSIS — Z89512 Acquired absence of left leg below knee: Secondary | ICD-10-CM | POA: Diagnosis not present

## 2020-12-21 LAB — URINE CULTURE: Culture: >=100000 — AB

## 2020-12-22 ENCOUNTER — Telehealth: Payer: Self-pay | Admitting: *Deleted

## 2020-12-22 NOTE — Telephone Encounter (Signed)
Post ED Visit - Positive Culture Follow-up  Culture report reviewed by antimicrobial stewardship pharmacist: [place] Team []  Elenor Quinones, Pharm.D. []  Heide Guile, Pharm.D., BCPS AQ-ID []  Parks Neptune, Pharm.D., BCPS []  Alycia Rossetti, Pharm.D., BCPS []  [place], [place].D., BCPS, AAHIVP []  Legrand Como, Pharm.D., BCPS, AAHIVP [x]  Salome Arnt, PharmD, BCPS []  Johnnette Gourd, PharmD, BCPS []  Hughes Better, PharmD, BCPS []  Leeroy Cha, PharmD []  Laqueta Linden, PharmD, BCPS []  Albertina Parr, PharmD  [place] Team []  Leodis Sias, PharmD []  Lindell Spar, PharmD []  Royetta Asal, PharmD []  Graylin Shiver, Rph []  Rema Fendt) Glennon Mac, PharmD []  Arlyn Dunning, PharmD []  Netta Cedars, PharmD []  Dia Sitter, PharmD []  Leone Haven, PharmD []  Gretta Arab, PharmD []  Theodis Shove, PharmD []  Peggyann Juba, PharmD []  Reuel Boom, PharmD   Positive urine culture Treated with Cephalexin, organism sensitive to the same and no further patient follow-up is required at this time.  Harlon Flor [facility] 12/22/2020, 10:29 AM

## 2020-12-25 DIAGNOSIS — E039 Hypothyroidism, unspecified: Secondary | ICD-10-CM | POA: Diagnosis not present

## 2020-12-25 DIAGNOSIS — F32A Depression, unspecified: Secondary | ICD-10-CM | POA: Diagnosis not present

## 2020-12-25 DIAGNOSIS — I10 Essential (primary) hypertension: Secondary | ICD-10-CM | POA: Diagnosis not present

## 2020-12-25 DIAGNOSIS — E785 Hyperlipidemia, unspecified: Secondary | ICD-10-CM | POA: Diagnosis not present

## 2020-12-25 DIAGNOSIS — K5900 Constipation, unspecified: Secondary | ICD-10-CM | POA: Diagnosis not present

## 2020-12-25 DIAGNOSIS — R1030 Lower abdominal pain, unspecified: Secondary | ICD-10-CM | POA: Diagnosis not present

## 2020-12-25 DIAGNOSIS — E119 Type 2 diabetes mellitus without complications: Secondary | ICD-10-CM | POA: Diagnosis not present

## 2020-12-27 DIAGNOSIS — Z794 Long term (current) use of insulin: Secondary | ICD-10-CM | POA: Diagnosis not present

## 2020-12-27 DIAGNOSIS — E119 Type 2 diabetes mellitus without complications: Secondary | ICD-10-CM | POA: Diagnosis not present

## 2021-01-04 DIAGNOSIS — E1165 Type 2 diabetes mellitus with hyperglycemia: Secondary | ICD-10-CM | POA: Diagnosis not present

## 2021-01-04 DIAGNOSIS — I10 Essential (primary) hypertension: Secondary | ICD-10-CM | POA: Diagnosis not present

## 2021-01-04 DIAGNOSIS — E11319 Type 2 diabetes mellitus with unspecified diabetic retinopathy without macular edema: Secondary | ICD-10-CM | POA: Diagnosis not present

## 2021-01-04 DIAGNOSIS — Z299 Encounter for prophylactic measures, unspecified: Secondary | ICD-10-CM | POA: Diagnosis not present

## 2021-01-09 ENCOUNTER — Telehealth: Payer: Self-pay | Admitting: Orthopedic Surgery

## 2021-01-09 NOTE — Telephone Encounter (Signed)
12/14/20 ov note faxed [facility] [phone]

## 2021-02-01 DIAGNOSIS — E119 Type 2 diabetes mellitus without complications: Secondary | ICD-10-CM | POA: Diagnosis not present

## 2021-02-01 DIAGNOSIS — Z89512 Acquired absence of left leg below knee: Secondary | ICD-10-CM | POA: Diagnosis not present

## 2021-02-01 DIAGNOSIS — R52 Pain, unspecified: Secondary | ICD-10-CM | POA: Diagnosis not present

## 2021-02-01 DIAGNOSIS — E785 Hyperlipidemia, unspecified: Secondary | ICD-10-CM | POA: Diagnosis not present

## 2021-02-01 DIAGNOSIS — Z89511 Acquired absence of right leg below knee: Secondary | ICD-10-CM | POA: Diagnosis not present

## 2021-02-01 DIAGNOSIS — F32A Depression, unspecified: Secondary | ICD-10-CM | POA: Diagnosis not present

## 2021-02-13 ENCOUNTER — Ambulatory Visit: Payer: Medicare Other | Admitting: Orthopedic Surgery

## 2021-02-13 DIAGNOSIS — I10 Essential (primary) hypertension: Secondary | ICD-10-CM | POA: Diagnosis not present

## 2021-02-13 DIAGNOSIS — E559 Vitamin D deficiency, unspecified: Secondary | ICD-10-CM | POA: Diagnosis not present

## 2021-02-13 DIAGNOSIS — K5900 Constipation, unspecified: Secondary | ICD-10-CM | POA: Diagnosis not present

## 2021-02-15 ENCOUNTER — Ambulatory Visit (INDEPENDENT_AMBULATORY_CARE_PROVIDER_SITE_OTHER): Payer: Medicare Other | Admitting: Physician Assistant

## 2021-02-15 ENCOUNTER — Encounter: Payer: Self-pay | Admitting: Orthopedic Surgery

## 2021-02-15 DIAGNOSIS — Z89512 Acquired absence of left leg below knee: Secondary | ICD-10-CM

## 2021-02-15 DIAGNOSIS — Z89511 Acquired absence of right leg below knee: Secondary | ICD-10-CM

## 2021-02-15 NOTE — Progress Notes (Signed)
Office Visit Note   Patient: Michelle Skinner           Date of Birth: [birth year]           MRN: [id number] Visit Date: 02/15/2021              Requested by: Monico Blitz, MD [id number] [place],  [place] [id number] PCP: Monico Blitz, MD  Chief Complaint  Patient presents with   Left Knee - Follow-up   Right Knee - Follow-up      HPI: Patient is a pleasant 55 year old woman who is 5 months status post bilateral below-knee amputations.  She has been measured and fitted for prosthetics which she expects to pick up in the near future she sometimes gets an intense itching feeling on the right below-knee amputation stump.  She also gets some phantom pain  Assessment & Plan: Visit Diagnoses: No diagnosis found.  Plan: Have recommended gabapentin.  She should also begin physical therapy for upper body strengthening and begin therapy when she gets her prosthetics.  Follow-up in 1 month hopefully she will be discharging from the skilled nursing soon and she can transition to outpatient rehab  Follow-Up Instructions: No follow-ups on file.   Ortho Exam  Patient is alert, oriented, no adenopathy, well-dressed, normal affect, normal respiratory effort. Bilateral below-knee amputation stumps well-healed incisions no cellulitis she does have some scarring over the right incision that is somewhat sensitive.  There is no fluctuance no signs of infection  Imaging: No results found. No images are attached to the encounter.  Labs: Lab Results  Component Value Date   HGBA1C 8.8 (H) 09/16/2020   HGBA1C 10.1 (H) 07/03/2020   HGBA1C 10.0 (A) 03/06/2020   REPTSTATUS 12/21/2020 FINAL 12/18/2020   GRAMSTAIN  09/10/2020    RARE WBC PRESENT, PREDOMINANTLY PMN ABUNDANT GRAM POSITIVE COCCI RARE GRAM NEGATIVE RODS    CULT >=100,000 COLONIES/mL ESCHERICHIA COLI (A) 12/18/2020   LABORGA ESCHERICHIA COLI (A) 12/18/2020     Lab Results  Component Value Date   ALBUMIN 2.8 (L) 12/17/2020   ALBUMIN <1.0  (L) 09/17/2020   ALBUMIN <1.0 (L) 09/16/2020   PREALBUMIN 11.1 (L) 09/16/2020    Lab Results  Component Value Date   MG 2.2 09/17/2020   MG 1.9 09/16/2020   MG 1.9 09/15/2020   Lab Results  Component Value Date   VD25OH 12 (L) 10/20/2019   VD25OH 22 (L) 04/01/2019   VD25OH 29 (L) 08/17/2018    Lab Results  Component Value Date   PREALBUMIN 11.1 (L) 09/16/2020   CBC EXTENDED Latest Ref Rng & Units 12/17/2020 09/28/2020 09/17/2020  WBC 4.0 - 10.5 K/uL 10.1 3.2(L) 11.9(H)  RBC 3.87 - 5.11 MIL/uL 4.04 2.68(L) 2.81(L)  HGB 12.0 - 15.0 g/dL 12.0 7.9(L) 8.2(L)  HCT 36.0 - 46.0 % 36.7 25.4(L) 25.7(L)  PLT 150 - 400 K/uL 173 220 308  NEUTROABS 1.7 - 7.7 K/uL 9.0(H) 2.3 -  LYMPHSABS 0.7 - 4.0 K/uL 0.4(L) 0.6(L) -     There is no height or weight on file to calculate BMI.  Orders:  No orders of the defined types were placed in this encounter.  No orders of the defined types were placed in this encounter.    Procedures: No procedures performed  Clinical Data: No additional findings.  ROS:  All other systems negative, except as noted in the HPI. Review of Systems  Objective: Vital Signs: There were no vitals taken for this visit.  Specialty Comments:  No  specialty comments available.  PMFS History: Patient Active Problem List   Diagnosis Date Noted   Peripherally inserted central catheter (PICC) in place 10/05/2020   MSSA bacteremia    Acute hematogenous osteomyelitis of both feet ([place])    Severe protein-calorie malnutrition ([place])    BMI 50.0-59.9, adult ([place])    Plantar ulcer of right foot ([place])    Plantar ulcer of left foot ([place])    Lymphedema    Sepsis ([place]) 09/06/2020   Cellulitis 04/27/2020   AKI (acute kidney injury) ([place]) 04/27/2020   Stasis dermatitis 04/27/2020   Anxiety 03/31/2020   Depression 03/31/2020   GERD (gastroesophageal reflux disease) 03/31/2020   Posttraumatic stress disorder 03/31/2020   Personal history of noncompliance with medical  treatment, presenting hazards to health 02/09/2018   Abnormal CT of the abdomen 10/20/2017   Dilated cbd, acquired 10/20/2017   Essential hypertension, benign 01/14/2017   Primary hypothyroidism 05/03/2015   Mixed hyperlipidemia 05/03/2015   Diabetes mellitus ([place]) 04/25/2015   Morbid (severe) obesity due to excess calories ([place]) 04/25/2015   Vitamin D deficiency 04/25/2015   Cervical cancer ([place]) 10/05/2012   Past Medical History:  Diagnosis Date   Amputee, above knee, left (HCC)    Amputee, above knee, right (HCC)    Anemia    Cervical cancer ([place])    Depression    Diabetes mellitus, type II ([place])    GERD (gastroesophageal reflux disease)    Hyperlipidemia    Neuropathy    PTSD (post-traumatic stress disorder)     Family History  Problem Relation Age of Onset   Hypertension Mother    Diabetes Father    Hyperlipidemia Father    CAD Father    Stroke Father    Osteoporosis Maternal Grandmother    Cancer Maternal Grandmother    Hypertension Maternal Grandmother    Colon cancer Neg Hx     Past Surgical History:  Procedure Laterality Date   ABDOMINAL HYSTERECTOMY     AMPUTATION Bilateral 09/13/2020   Procedure: BILATERAL BELOW KNEE AMPUTATIONS;  Surgeon: [name], MD;  Location: [facility];  Service: Orthopedics;  Laterality: Bilateral;   COLONOSCOPY WITH PROPOFOL N/A 05/25/2018   Procedure: COLONOSCOPY WITH PROPOFOL;  Surgeon: Daneil Dolin, MD;  [facility];  Service: Endoscopy;  Laterality: N/A;  7:30am   I & D EXTREMITY Bilateral 09/10/2020   Procedure: IRRIGATION AND DEBRIDEMENT FEET;;  Surgeon: [name], MD;  Location: [facility];  Service: Orthopedics;  Laterality: Bilateral;   IRRIGATION AND DEBRIDEMENT FOOT Bilateral 09/08/2020   Procedure: IRRIGATION AND DEBRIDEMENT FOOT;  Surgeon: Virl Cagey, MD;  Location[facility];  Service: General;  Laterality: Bilateral;  left foot 11 x 10.5 x 1    OTHER SURGICAL HISTORY Right    R Foot- I&D   POLYPECTOMY   05/25/2018   Procedure: POLYPECTOMY;  Surgeon: Daneil Dolin, MD;  [facility];  Service: Endoscopy;;  colon   UMBILICAL HERNIA REPAIR  2007   Social History   Occupational History   Not on file  Tobacco Use   Smoking status: Never   Smokeless tobacco: Never  Vaping Use   Vaping Use: Never used  Substance and Sexual Activity   Alcohol use: No    Alcohol/week: 0.0 standard drinks   Drug use: No   Sexual activity: Yes    Birth control/protection: Surgical

## 2021-02-26 DIAGNOSIS — F32A Depression, unspecified: Secondary | ICD-10-CM | POA: Diagnosis not present

## 2021-02-26 DIAGNOSIS — R52 Pain, unspecified: Secondary | ICD-10-CM | POA: Diagnosis not present

## 2021-02-26 DIAGNOSIS — E785 Hyperlipidemia, unspecified: Secondary | ICD-10-CM | POA: Diagnosis not present

## 2021-02-26 DIAGNOSIS — Z89512 Acquired absence of left leg below knee: Secondary | ICD-10-CM | POA: Diagnosis not present

## 2021-02-26 DIAGNOSIS — I10 Essential (primary) hypertension: Secondary | ICD-10-CM | POA: Diagnosis not present

## 2021-02-26 DIAGNOSIS — E119 Type 2 diabetes mellitus without complications: Secondary | ICD-10-CM | POA: Diagnosis not present

## 2021-02-26 DIAGNOSIS — R197 Diarrhea, unspecified: Secondary | ICD-10-CM | POA: Diagnosis not present

## 2021-02-26 DIAGNOSIS — Z79899 Other long term (current) drug therapy: Secondary | ICD-10-CM | POA: Diagnosis not present

## 2021-02-27 ENCOUNTER — Ambulatory Visit: Payer: Medicare Other | Admitting: "Endocrinology

## 2021-03-01 DIAGNOSIS — Z79899 Other long term (current) drug therapy: Secondary | ICD-10-CM | POA: Diagnosis not present

## 2021-03-01 DIAGNOSIS — R5381 Other malaise: Secondary | ICD-10-CM | POA: Diagnosis not present

## 2021-03-01 DIAGNOSIS — Z89511 Acquired absence of right leg below knee: Secondary | ICD-10-CM | POA: Diagnosis not present

## 2021-03-01 DIAGNOSIS — R52 Pain, unspecified: Secondary | ICD-10-CM | POA: Diagnosis not present

## 2021-03-01 DIAGNOSIS — Z89512 Acquired absence of left leg below knee: Secondary | ICD-10-CM | POA: Diagnosis not present

## 2021-03-05 DIAGNOSIS — F32A Depression, unspecified: Secondary | ICD-10-CM | POA: Diagnosis not present

## 2021-03-05 DIAGNOSIS — Z79899 Other long term (current) drug therapy: Secondary | ICD-10-CM | POA: Diagnosis not present

## 2021-03-05 DIAGNOSIS — R5381 Other malaise: Secondary | ICD-10-CM | POA: Diagnosis not present

## 2021-03-05 DIAGNOSIS — Z89511 Acquired absence of right leg below knee: Secondary | ICD-10-CM | POA: Diagnosis not present

## 2021-03-05 DIAGNOSIS — E785 Hyperlipidemia, unspecified: Secondary | ICD-10-CM | POA: Diagnosis not present

## 2021-03-05 DIAGNOSIS — Z89512 Acquired absence of left leg below knee: Secondary | ICD-10-CM | POA: Diagnosis not present

## 2021-03-05 DIAGNOSIS — E559 Vitamin D deficiency, unspecified: Secondary | ICD-10-CM | POA: Diagnosis not present

## 2021-03-14 ENCOUNTER — Other Ambulatory Visit: Payer: Self-pay

## 2021-03-14 ENCOUNTER — Ambulatory Visit (INDEPENDENT_AMBULATORY_CARE_PROVIDER_SITE_OTHER): Payer: Medicare Other | Admitting: Physician Assistant

## 2021-03-14 ENCOUNTER — Encounter: Payer: Self-pay | Admitting: Physician Assistant

## 2021-03-14 DIAGNOSIS — Z89512 Acquired absence of left leg below knee: Secondary | ICD-10-CM

## 2021-03-14 DIAGNOSIS — G546 Phantom limb syndrome with pain: Secondary | ICD-10-CM | POA: Diagnosis not present

## 2021-03-14 DIAGNOSIS — Z89511 Acquired absence of right leg below knee: Secondary | ICD-10-CM | POA: Diagnosis not present

## 2021-03-14 MED ORDER — GABAPENTIN 300 MG PO CAPS: 300.0000 mg | ORAL_CAPSULE | Freq: Three times a day (TID) | ORAL | 2 refills | Status: DC

## 2021-03-14 NOTE — Progress Notes (Signed)
Office Visit Note   Patient: Michelle Skinner           Date of Birth: Jan 07, 1966           MRN: [id number] Visit Date: 03/14/2021              Requested by: Monico Blitz, MD [address] [place],  [place] [id number] PCP: Monico Blitz, MD  Chief Complaint  Patient presents with   Right Leg - Follow-up   Left Leg - Follow-up      HPI: The patient is a 55 year old woman who presents 6 months status post bilateral below-knee amputation things are well-healed.  She states she is awaiting prosthesis set up.  She is residing at skilled nursing.  She is in a wheelchair for mobility.  She is having ongoing issues with phantom pain especially on the left she also feels that she is developing some scar tissue.  Has not yet begun taking gabapentin.  She has taken this in the past with some relief.   Assessment & Plan: Visit Diagnoses: No diagnosis found.  Plan: Have recommended gabapentin.  Order provided today.  Hopes that some of her pain and scar tissue will dissipate once she begins wearing her prosthetics.  Instructed on scar massage. Follow-Up Instructions: No follow-ups on file.   Ortho Exam  Patient is alert, oriented, no adenopathy, well-dressed, normal affect, normal respiratory effort. Bilateral below-knee amputation stumps well-healed incisions.  Well consolidated no cellulitis she does have some scarring over the right incision that is somewhat tender to touch.  There is no fluctuance no signs of infection  Imaging: No results found. No images are attached to the encounter.  Labs: Lab Results  Component Value Date   HGBA1C 8.8 (H) 09/16/2020   HGBA1C 10.1 (H) 07/03/2020   HGBA1C 10.0 (A) 03/06/2020   REPTSTATUS 12/21/2020 FINAL 12/18/2020   GRAMSTAIN  09/10/2020    RARE WBC PRESENT, PREDOMINANTLY PMN ABUNDANT GRAM POSITIVE COCCI RARE GRAM NEGATIVE RODS    CULT >=100,000 COLONIES/mL ESCHERICHIA COLI (A) 12/18/2020   LABORGA ESCHERICHIA COLI (A) 12/18/2020     Lab  Results  Component Value Date   ALBUMIN 2.8 (L) 12/17/2020   ALBUMIN <1.0 (L) 09/17/2020   ALBUMIN <1.0 (L) 09/16/2020   PREALBUMIN 11.1 (L) 09/16/2020    Lab Results  Component Value Date   MG 2.2 09/17/2020   MG 1.9 09/16/2020   MG 1.9 09/15/2020   Lab Results  Component Value Date   VD25OH 12 (L) 10/20/2019   VD25OH 22 (L) 04/01/2019   VD25OH 29 (L) 08/17/2018    Lab Results  Component Value Date   PREALBUMIN 11.1 (L) 09/16/2020   CBC EXTENDED Latest Ref Rng & Units 12/17/2020 09/28/2020 09/17/2020  WBC 4.0 - 10.5 K/uL 10.1 3.2(L) 11.9(H)  RBC 3.87 - 5.11 MIL/uL 4.04 2.68(L) 2.81(L)  HGB 12.0 - 15.0 g/dL 12.0 7.9(L) 8.2(L)  HCT 36.0 - 46.0 % 36.7 25.4(L) 25.7(L)  PLT 150 - 400 K/uL 173 220 308  NEUTROABS 1.7 - 7.7 K/uL 9.0(H) 2.3 -  LYMPHSABS 0.7 - 4.0 K/uL 0.4(L) 0.6(L) -     There is no height or weight on file to calculate BMI.  Orders:  No orders of the defined types were placed in this encounter.  Meds ordered this encounter  Medications   gabapentin (NEURONTIN) 300 MG capsule    Sig: Take 1 capsule (300 mg total) by mouth 3 (three) times daily.    Dispense:  90 capsule  Refill:  2      Procedures: No procedures performed  Clinical Data: No additional findings.  ROS:  All other systems negative, except as noted in the HPI. Review of Systems  Objective: Vital Signs: There were no vitals taken for this visit.  Specialty Comments:  No specialty comments available.  PMFS History: Patient Active Problem List   Diagnosis Date Noted   Peripherally inserted central catheter (PICC) in place 10/05/2020   MSSA bacteremia    Acute hematogenous osteomyelitis of both feet ([place])    Severe protein-calorie malnutrition ([place])    BMI 50.0-59.9, adult ([place])    Plantar ulcer of right foot ([place])    Plantar ulcer of left foot ([place])    Lymphedema    Sepsis ([place]) 09/06/2020   Cellulitis 04/27/2020   AKI (acute kidney injury) ([place]) 04/27/2020   Stasis  dermatitis 04/27/2020   Anxiety 03/31/2020   Depression 03/31/2020   GERD (gastroesophageal reflux disease) 03/31/2020   Posttraumatic stress disorder 03/31/2020   Personal history of noncompliance with medical treatment, presenting hazards to health 02/09/2018   Abnormal CT of the abdomen 10/20/2017   Dilated cbd, acquired 10/20/2017   Essential hypertension, benign 01/14/2017   Primary hypothyroidism 05/03/2015   Mixed hyperlipidemia 05/03/2015   Diabetes mellitus ([place]) 04/25/2015   Morbid (severe) obesity due to excess calories ([place]) 04/25/2015   Vitamin D deficiency 04/25/2015   Cervical cancer ([place]) 10/05/2012   Past Medical History:  Diagnosis Date   Amputee, above knee, left (H[facility])    Amputee, above knee, right ([facility])    Anemia    Cervical cancer ([place])    Depression    Diabetes mellitus, type II ([place])    GERD (gastroesophageal reflux disease)    Hyperlipidemia    Neuropathy    PTSD (post-traumatic stress disorder)     Family History  Problem Relation Age of Onset   Hypertension Mother    Diabetes Father    Hyperlipidemia Father    CAD Father    Stroke Father    Osteoporosis Maternal Grandmother    Cancer Maternal Grandmother    Hypertension Maternal Grandmother    Colon cancer Neg Hx     Past Surgical History:  Procedure Laterality Date   ABDOMINAL HYSTERECTOMY     AMPUTATION Bilateral 09/13/2020   Procedure: BILATERAL BELOW KNEE AMPUTATIONS;  Surgeon: [name], MD;  Location: [facility];  Service: Orthopedics;  Laterality: Bilateral;   COLONOSCOPY WITH PROPOFOL N/A 05/25/2018   Procedure: COLONOSCOPY WITH PROPOFOL;  Surgeon: Daneil Dolin, MD;  [facility];  Service: Endoscopy;  Laterality: N/A;  7:30am   I & D EXTREMITY Bilateral 09/10/2020   Procedure: IRRIGATION AND DEBRIDEMENT FEET;;  Surgeon: [name], MD;  Location: [facility];  Service: Orthopedics;  Laterality: Bilateral;   IRRIGATION AND DEBRIDEMENT FOOT Bilateral 09/08/2020   Procedure:  IRRIGATION AND DEBRIDEMENT FOOT;  Surgeon: Virl Cagey, MD;  Location[facility];  Service: General;  Laterality: Bilateral;  left foot 11 x 10.5 x 1    OTHER SURGICAL HISTORY Right    R Foot- I&D   POLYPECTOMY  05/25/2018   Procedure: POLYPECTOMY;  Surgeon: Daneil Dolin, MD;  [facility];  Service: Endoscopy;;  colon   UMBILICAL HERNIA REPAIR  2007   Social History   Occupational History   Not on file  Tobacco Use   Smoking status: Never   Smokeless tobacco: Never  Vaping Use   Vaping Use: Never used  Substance and Sexual Activity   Alcohol use: No    Alcohol/week: 0.0 standard drinks   Drug use: No   Sexual activity: Yes    Birth control/protection: Surgical

## 2021-03-22 DIAGNOSIS — R600 Localized edema: Secondary | ICD-10-CM | POA: Diagnosis not present

## 2021-03-22 DIAGNOSIS — R52 Pain, unspecified: Secondary | ICD-10-CM | POA: Diagnosis not present

## 2021-03-22 DIAGNOSIS — E559 Vitamin D deficiency, unspecified: Secondary | ICD-10-CM | POA: Diagnosis not present

## 2021-03-22 DIAGNOSIS — S60221A Contusion of right hand, initial encounter: Secondary | ICD-10-CM | POA: Diagnosis not present

## 2021-03-22 DIAGNOSIS — I10 Essential (primary) hypertension: Secondary | ICD-10-CM | POA: Diagnosis not present

## 2021-03-22 DIAGNOSIS — E039 Hypothyroidism, unspecified: Secondary | ICD-10-CM | POA: Diagnosis not present

## 2021-03-22 DIAGNOSIS — E785 Hyperlipidemia, unspecified: Secondary | ICD-10-CM | POA: Diagnosis not present

## 2021-03-22 DIAGNOSIS — E119 Type 2 diabetes mellitus without complications: Secondary | ICD-10-CM | POA: Diagnosis not present

## 2021-03-26 DIAGNOSIS — Z79899 Other long term (current) drug therapy: Secondary | ICD-10-CM | POA: Diagnosis not present

## 2021-03-26 DIAGNOSIS — I10 Essential (primary) hypertension: Secondary | ICD-10-CM | POA: Diagnosis not present

## 2021-03-26 DIAGNOSIS — R600 Localized edema: Secondary | ICD-10-CM | POA: Diagnosis not present

## 2021-03-26 DIAGNOSIS — E559 Vitamin D deficiency, unspecified: Secondary | ICD-10-CM | POA: Diagnosis not present

## 2021-03-26 DIAGNOSIS — E119 Type 2 diabetes mellitus without complications: Secondary | ICD-10-CM | POA: Diagnosis not present

## 2021-03-26 DIAGNOSIS — R52 Pain, unspecified: Secondary | ICD-10-CM | POA: Diagnosis not present

## 2021-03-28 DIAGNOSIS — E039 Hypothyroidism, unspecified: Secondary | ICD-10-CM | POA: Diagnosis not present

## 2021-03-28 LAB — BASIC METABOLIC PANEL WITH GFR
BUN: 22 — AB (ref 4–21)
Creatinine: 1 (ref 0.5–1.1)

## 2021-03-28 LAB — TSH: TSH: 1.55 (ref 0.41–5.90)

## 2021-03-29 ENCOUNTER — Other Ambulatory Visit: Payer: Self-pay

## 2021-03-29 ENCOUNTER — Ambulatory Visit (INDEPENDENT_AMBULATORY_CARE_PROVIDER_SITE_OTHER): Payer: Medicare Other | Admitting: "Endocrinology

## 2021-03-29 ENCOUNTER — Encounter: Payer: Self-pay | Admitting: "Endocrinology

## 2021-03-29 VITALS — BP 120/78 | HR 64 | Wt 333.0 lb

## 2021-03-29 DIAGNOSIS — E782 Mixed hyperlipidemia: Secondary | ICD-10-CM

## 2021-03-29 DIAGNOSIS — I10 Essential (primary) hypertension: Secondary | ICD-10-CM

## 2021-03-29 DIAGNOSIS — E039 Hypothyroidism, unspecified: Secondary | ICD-10-CM

## 2021-03-29 DIAGNOSIS — E1159 Type 2 diabetes mellitus with other circulatory complications: Secondary | ICD-10-CM | POA: Diagnosis not present

## 2021-03-29 LAB — HEMOGLOBIN A1C: Hemoglobin A1C: 6.5

## 2021-03-29 LAB — POCT GLYCOSYLATED HEMOGLOBIN (HGB A1C): HbA1c, POC (controlled diabetic range): 6.5 % (ref 0.0–7.0)

## 2021-03-29 MED ORDER — FREESTYLE LIBRE 2 READER DEVI: 0 refills | Status: DC

## 2021-03-29 MED ORDER — FREESTYLE LIBRE 2 SENSOR MISC: 1.0000 | 3 refills | Status: DC

## 2021-03-29 MED ORDER — INSULIN GLARGINE 100 UNIT/ML ~~LOC~~ SOLN: 26.0000 [IU] | Freq: Every day | SUBCUTANEOUS | 3 refills | Status: DC

## 2021-03-29 NOTE — Patient Instructions (Signed)

## 2021-03-29 NOTE — Progress Notes (Signed)
03/29/2021  Endocrinology follow-up note  Subjective:    Patient ID: Michelle Skinner, female    DOB: [birth year],    Past Medical History:  Diagnosis Date   Amputee, above knee, left ([place])    Amputee, above knee, right ([place])    Anemia    Cervical cancer ([place])    Depression    Diabetes mellitus, type II ([place])    GERD (gastroesophageal reflux disease)    Hyperlipidemia    Neuropathy    PTSD (post-traumatic stress disorder)    Past Surgical History:  Procedure Laterality Date   ABDOMINAL HYSTERECTOMY     AMPUTATION Bilateral 09/13/2020   Procedure: BILATERAL BELOW KNEE AMPUTATIONS;  Surgeon: [name], MD;  Location: [facility];  Service: Orthopedics;  Laterality: Bilateral;   COLONOSCOPY WITH PROPOFOL N/A 05/25/2018   Procedure: COLONOSCOPY WITH PROPOFOL;  Surgeon: Daneil Dolin, MD;  [facility];  Service: Endoscopy;  Laterality: N/A;  7:30am   I & D EXTREMITY Bilateral 09/10/2020   Procedure: IRRIGATION AND DEBRIDEMENT FEET;;  Surgeon: [name], MD;  Location: [facility];  Service: Orthopedics;  Laterality: Bilateral;   IRRIGATION AND DEBRIDEMENT FOOT Bilateral 09/08/2020   Procedure: IRRIGATION AND DEBRIDEMENT FOOT;  Surgeon: Virl Cagey, MD;  [facility];  Service: General;  Laterality: Bilateral;  left foot 11 x 10.5 x 1    OTHER SURGICAL HISTORY Right    R Foot- I&D   POLYPECTOMY  05/25/2018   Procedure: POLYPECTOMY;  Surgeon: Daneil Dolin, MD;  [facility];  Service: Endoscopy;;  colon   UMBILICAL HERNIA REPAIR  2007   Social History   Socioeconomic History   Marital status: Married    Spouse name: Not on file   Number of children: Not on file   Years of education: Not on file   Highest education level: Not on file  Occupational History   Not on file  Tobacco Use   Smoking status: Never   Smokeless tobacco: Never  Vaping Use   Vaping Use: Never used  Substance and Sexual Activity   Alcohol use: No    Alcohol/week: 0.0  standard drinks   Drug use: No   Sexual activity: Yes    Birth control/protection: Surgical  Other Topics Concern   Not on file  Social History Narrative   Not on file   Social Determinants of Health   Financial Resource Strain: Not on file  Food Insecurity: Not on file  Transportation Needs: Not on file  Physical Activity: Not on file  Stress: Not on file  Social Connections: Not on file   Outpatient Encounter Medications as of 03/29/2021  Medication Sig   insulin lispro (HUMALOG) 100 UNIT/ML KwikPen Inject 5-11 Units into the skin 3 (three) times daily before meals.   Ascorbic Acid (VITAMIN C PO) Take 1 tablet by mouth at bedtime.   atorvastatin (LIPITOR) 40 MG tablet Take 1 tablet (40 mg total) by mouth at bedtime.   Calcium Carbonate (CALCIUM 500 PO) Take 1 tablet by mouth daily.   carvedilol (COREG) 12.5 MG tablet Take 1 tablet (12.5 mg total) by mouth 2 (two) times daily.   Cholecalciferol (VITAMIN D3) 125 MCG (5000 UT) CAPS Take 1 capsule (5,000 Units total) by mouth daily.   Continuous Blood Gluc Receiver (FREESTYLE LIBRE 2 READER) DEVI As directed   Continuous Blood Gluc Sensor (FREESTYLE LIBRE 2 SENSOR) MISC 1 Piece by Does not apply route every 14 (fourteen) days.  enoxaparin (LOVENOX) 80 MG/0.8ML injection Inject 0.7 mLs (70 mg total) into the skin daily.   Ferrous Sulfate (IRON PO) Take 1 tablet by mouth at bedtime.   Ferrous Sulfate (IRON) 325 (65 Fe) MG TABS Take 1 tablet by mouth at bedtime.   FLUoxetine (PROZAC) 40 MG capsule Take 40 mg by mouth at bedtime.   folic acid (FOLVITE) 1 MG tablet Take 1 tablet (1 mg total) by mouth daily.   gabapentin (NEURONTIN) 300 MG capsule Take 1 capsule (300 mg total) by mouth 3 (three) times daily.   insulin glargine (LANTUS) 100 UNIT/ML injection Inject 0.26 mLs (26 Units total) into the skin at bedtime.   levothyroxine (SYNTHROID) 150 MCG tablet Take 1 tablet (150 mcg total) by mouth daily before breakfast.   lisinopril  (ZESTRIL) 20 MG tablet Take 20 mg by mouth at bedtime.   magnesium citrate SOLN Take 296 mLs (1 Bottle total) by mouth daily as needed for severe constipation.   Multiple Vitamins-Minerals (PRESERVISION AREDS 2) CAPS Take 1 capsule by mouth 2 (two) times daily.   neomycin-bacitracin-polymyxin (NEOSPORIN) ointment Apply 1 application topically every 12 (twelve) hours. To legs   nutrition supplement, JUVEN, (JUVEN) PACK Take 1 packet by mouth 2 (two) times daily between meals.   ondansetron (ZOFRAN) 4 MG tablet Take 4 mg by mouth every 8 (eight) hours as needed for nausea or vomiting.   Oxycodone HCl 10 MG TABS Take 10 mg by mouth every 6 (six) hours as needed.   pantoprazole (PROTONIX) 40 MG tablet TAKE ONE (1) TABLET EACH DAY (Patient taking differently: Take 40 mg by mouth at bedtime.)   polyethylene glycol (MIRALAX / GLYCOLAX) 17 g packet Take 17 g by mouth 2 (two) times daily as needed for mild constipation.   promethazine (PHENERGAN) 25 MG suppository Place 1 suppository (25 mg total) rectally every 6 (six) hours as needed for nausea or vomiting.   senna-docusate (SENOKOT-S) 8.6-50 MG tablet Take 1 tablet by mouth 2 (two) times daily as needed for moderate constipation.   Thiamine HCl (VITAMIN B-1 PO) Take 1 tablet by mouth daily.   [DISCONTINUED] insulin aspart (NOVOLOG) 100 UNIT/ML injection Inject 10-16 Units into the skin 3 (three) times daily before meals.   [DISCONTINUED] insulin glargine (LANTUS) 100 UNIT/ML injection Inject 0.3 mLs (30 Units total) into the skin at bedtime.   No facility-administered encounter medications on file as of 03/29/2021.   ALLERGIES: Allergies  Allergen Reactions   Sulfa Antibiotics Diarrhea, Nausea And Vomiting and Rash   Clindamycin/Lincomycin Diarrhea and Nausea And Vomiting   Invokana [Canagliflozin] Diarrhea and Nausea And Vomiting   Latex     Rash, itching, over a long period of time   Tape Itching and Swelling    Reaction after a few days use/  please use paper tape   Bactrim [Sulfamethoxazole-Trimethoprim] Diarrhea, Nausea And Vomiting and Rash   Cherry Itching, Swelling and Rash    Throat swelling   Gabapentin Diarrhea, Nausea And Vomiting and Rash   Other Itching, Swelling and Rash    Reaction to Hot peppers (throat swelling)   VACCINATION STATUS: Immunization History  Administered Date(s) Administered   Influenza Split 08/15/2014   Influenza,inj,Quad PF,6+ Mos 06/10/2017, 04/30/2020   Tdap 09/02/2014    Diabetes She presents for her follow-up diabetic visit. She has type 2 diabetes mellitus. Onset time: She was diagnosed at approximate age of 82 years. Her disease course has been improving (In the interval, she was hospitalized for bilateral lower extremity cellulitis  complicated by osteomyelitis which led to bilateral below-knee amputation, currently recovering in nursing home.). There are no hypoglycemic associated symptoms. Pertinent negatives for hypoglycemia include no confusion, headaches, pallor or seizures. Pertinent negatives for diabetes include no chest pain, no fatigue, no polydipsia, no polyphagia and no polyuria. There are no hypoglycemic complications. Symptoms are improving. Diabetic complications include PVD and retinopathy. Pertinent negatives for diabetic complications include no autonomic neuropathy or peripheral neuropathy. (She has bilateral Charcot's feet, bilateral lower extremity lymphedema.) Risk factors for coronary artery disease include diabetes mellitus, dyslipidemia, hypertension, obesity and sedentary lifestyle. Current diabetic treatment includes intensive insulin program. She is compliant with treatment most of the time. Her weight is fluctuating minimally. She is following a generally unhealthy diet. She has not had a previous visit with a dietitian (She missed her appointment.). She never participates in exercise. She monitors blood glucose at home 3-4 x per day. Her home blood glucose trend is  decreasing steadily. Her breakfast blood glucose range is generally 130-140 mg/dl. Her lunch blood glucose range is generally 130-140 mg/dl. Her dinner blood glucose range is generally 130-140 mg/dl. Her bedtime blood glucose range is generally 130-140 mg/dl. Her overall blood glucose range is 130-140 mg/dl. (She returns with continued improvement in her glycemic profile.  Her point-of-care A1c 6.5% improving from 8.8% during her last visit.  She remains in nursing home as a result of her recent  bilateral below-knee amputation from complicated cellulitis/osteomyelitis.      ) An ACE inhibitor/angiotensin II receptor blocker is not being taken. Eye exam is current.  Hyperlipidemia This is a chronic problem. The current episode started more than 1 year ago. The problem is uncontrolled. Recent lipid tests were reviewed and are high. Exacerbating diseases include diabetes, hypothyroidism and obesity. Pertinent negatives include no chest pain, myalgias or shortness of breath. She is currently on no antihyperlipidemic treatment.  Thyroid Problem Presents for initial visit. Patient reports no cold intolerance, diarrhea, fatigue, heat intolerance or palpitations. The following procedures have not been performed: radioiodine uptake scan and thyroidectomy. Her past medical history is significant for diabetes and hyperlipidemia.   Review of systems   Objective:    BP 120/78   Pulse 64   Wt (!) 333 lb (151 kg)   BMI 52.16 kg/m   Wt Readings from Last 3 Encounters:  03/29/21 (!) 333 lb (151 kg)  12/17/20 (!) 326 lb 1 oz (147.9 kg)  10/09/20 (!) 326 lb (147.9 kg)     Physical Exam- Limited  Constitutional: On recliner.  She is status post bilateral below-knee amputation.   Psych: Reluctant affect.  CMP Latest Ref Rng & Units 12/17/2020 09/28/2020 09/17/2020  Glucose 70 - 99 mg/dL 150(H) 127(H) 170(H)  BUN 6 - 20 mg/dL 38(H) 15 20  Creatinine 0.44 - 1.00 mg/dL 1.19(H) 0.70 0.85  Sodium 135 - 145  mmol/L 133(L) 136 136  Potassium 3.5 - 5.1 mmol/L 3.5 3.3(L) 4.8  Chloride 98 - 111 mmol/L 97(L) 102 105  CO2 22 - 32 mmol/L 25 26 28   Calcium 8.9 - 10.3 mg/dL 9.1 7.8(L) 7.6(L)  Total Protein 6.5 - 8.1 g/dL 6.9 - -  Total Bilirubin 0.3 - 1.2 mg/dL 1.1 - -  Alkaline Phos 38 - 126 U/L 93 - -  AST 15 - 41 U/L 15 - -  ALT 0 - 44 U/L 18 - -      Diabetic Labs (most recent): Lab Results  Component Value Date   HGBA1C 6.5 03/29/2021   HGBA1C  8.8 (H) 09/16/2020   HGBA1C 10.1 (H) 07/03/2020    Lipid Panel     Component Value Date/Time   CHOL 188 07/03/2020 1001   TRIG 192 (H) 07/03/2020 1001   HDL 42 (L) 07/03/2020 1001   CHOLHDL 4.5 07/03/2020 1001   VLDL 42 (H) 04/25/2015 1001   LDLCALC 115 (H) 07/03/2020 1001    Assessment & Plan:   1. Uncontrolled type 2 diabetes mellitus with complication, with long-term current use of insulin ([place])   Her diabetes is complicated by bilateral peripheral neuropathy , lateral Charcot's feet status post bilateral below-knee amputation due to complicated cellulitis/osteomyelitis, history of noncompliance/nonadherence, and possible retinopathy.  - She has chronically uncontrolled symptomatic type 2 DM of approximately since age 46 years duration.  She returns with continued improvement in her glycemic profile.  Her point-of-care A1c 6.5% improving from 8.8% during her last visit.  She remains in nursing home as a result of her recent  bilateral below-knee amputation from complicated cellulitis/osteomyelitis.    -She remains at extremely high risk for more acute and chronic complications of diabetes which include CAD, CVA, CKD, retinopathy, and neuropathy. These are all discussed in detail with the patient.   - I have counseled the patient on diet management and weight loss, by adopting a carbohydrate restricted/protein rich diet.  - Patient is advised to stick to a routine mealtimes to eat 3 meals  a day and avoid unnecessary snacks ( to snack  only to correct hypoglycemia).   - she acknowledges that there is a room for improvement in her food and drink choices. - Suggestion is made for her to avoid simple carbohydrates  from her diet including Cakes, Sweet Desserts, Ice Cream, Soda (diet and regular), Sweet Tea, Candies, Chips, Cookies, Store Bought Juices, Alcohol in Excess of  1-2 drinks a day, Artificial Sweeteners,  Coffee Creamer, and "Sugar-free" Products, Lemonade. This will help patient to have more stable blood glucose profile and potentially avoid unintended weight gain.  - I encouraged the patient to switch to  unprocessed or minimally processed complex starch and increased protein intake (animal or plant source), fruits, and vegetables.   - I have approached patient with the following individualized plan to manage diabetes and patient agrees:   -She is asking to get her CGM back.  She will benefit from this device.  I will proceed with prescription.  She remains in the nursing home.  She will be continued on Lantus and NovoLog instead of insulin U500.     -Due to tightening of fasting glycemia, will be continued on lower dose of Lantus at 26 units nightly, lower NovoLog to 5-11 units 3 times daily AC for Premeal blood glucose readings above 90 mg per DL. - She is warned not to take insulin without monitoring blood glucose properly.   -Patient is encouraged to call clinic for blood glucose levels less than 70 or above 300 mg /dl. - she is adamant saying that she does not tolerate MTF, SGLT2 inhibitors,  And GLP-1 receptor agonist including [place].  Interpretation of CGM printouts discussed with her. - Patient specific target  A1c;  LDL, HDL, Triglycerides were discussed in detail.  2) BP/HTN:  -Her blood pressure is controlled to target.   She is advised to take her blood pressure medications in the morning .  Advised to continue lisinopril 20 mg daily, carvedilol 25 mg p.o. twice daily, advised on salt restrictions.      3) Lipids/HPL: Most recent lipid panel  showed LDL increasing to 115 from 109.  She is advised to continue Lipitor 40 mg p.o. nightly.    Side effects and precautions discussed with her.       4) vitamin D deficiency:  -She is advised to continue vitamin D2 50,000 units weekly in addition to her regular maintenance dose of vitamin D3 5000 units daily.     5)  Weight/Diet:   Her BMI is 50+ .  Clearly this is complicating her care for diabetes.  She is a candidate for modest weight loss.  She cannot exercise optimally.  She keeps missing her appointment with  CDE exercise, and carbohydrates information provided.   6) hypothyroidism:  -Her recent thyroid function tests are consistent with inadequate replacement.  She will be continued on her current dose of levothyroxine 150 mcg p.o. daily before breakfast.     - We discussed about the correct intake of her thyroid hormone, on empty stomach at fasting, with water, separated by at least 30 minutes from breakfast and other medications,  and separated by more than 4 hours from calcium, iron, multivitamins, acid reflux medications (PPIs). -Patient is made aware of the fact that thyroid hormone replacement is needed for life, dose to be adjusted by periodic monitoring of thyroid function tests.    6) Chronic Care/Health Maintenance:  -Patient is advised to continue lisinopril, Lipitor. She is encouraged to continue to follow up with Ophthalmology, Podiatrist at least yearly or according to recommendations, and advised to stay away from smoking. I have recommended yearly flu vaccine and pneumonia vaccination at least every 5 years; moderate intensity exercise for up to 150 minutes weekly; and  sleep for at least 7 hours a day.   I advised patient to maintain close follow up with their PCP for primary care needs.   I spent 41 minutes in the care of the patient today including review of labs from [place], Lipids, Thyroid Function, Hematology (current  and previous including abstractions from other facilities); face-to-face time discussing  her blood glucose readings/logs, discussing hypoglycemia and hyperglycemia episodes and symptoms, medications doses, her options of short and long term treatment based on the latest standards of care / guidelines;  discussion about incorporating lifestyle medicine;  and documenting the encounter.    Please refer to Patient Instructions for Blood Glucose Monitoring and Insulin/Medications Dosing Guide"  in media tab for additional information. Please  also refer to " Patient Self Inventory" in the Media  tab for reviewed elements of pertinent patient history.  Michelle Skinner participated in the discussions, expressed understanding, and voiced agreement with the above plans.  All questions were answered to her satisfaction. she is encouraged to contact clinic should she have any questions or concerns prior to her return visit.   Follow up plan: Return in about 3 months (around 06/28/2021) for Bring Meter and Logs- A1c in Office.  Glade Lloyd, MD Phone: [phone]  Fax: [phone]  -  This note was partially dictated with voice recognition software. Similar sounding words can be transcribed inadequately or may not  be corrected upon review.  03/29/2021, 1:16 PM

## 2021-03-31 DIAGNOSIS — R112 Nausea with vomiting, unspecified: Secondary | ICD-10-CM | POA: Diagnosis not present

## 2021-04-02 DIAGNOSIS — E785 Hyperlipidemia, unspecified: Secondary | ICD-10-CM | POA: Diagnosis not present

## 2021-04-02 DIAGNOSIS — I10 Essential (primary) hypertension: Secondary | ICD-10-CM | POA: Diagnosis not present

## 2021-04-02 DIAGNOSIS — E1165 Type 2 diabetes mellitus with hyperglycemia: Secondary | ICD-10-CM | POA: Diagnosis not present

## 2021-04-02 DIAGNOSIS — R112 Nausea with vomiting, unspecified: Secondary | ICD-10-CM | POA: Diagnosis not present

## 2021-04-02 DIAGNOSIS — F32A Depression, unspecified: Secondary | ICD-10-CM | POA: Diagnosis not present

## 2021-04-02 DIAGNOSIS — R52 Pain, unspecified: Secondary | ICD-10-CM | POA: Diagnosis not present

## 2021-04-02 DIAGNOSIS — E559 Vitamin D deficiency, unspecified: Secondary | ICD-10-CM | POA: Diagnosis not present

## 2021-04-02 DIAGNOSIS — E119 Type 2 diabetes mellitus without complications: Secondary | ICD-10-CM | POA: Diagnosis not present

## 2021-04-02 DIAGNOSIS — E039 Hypothyroidism, unspecified: Secondary | ICD-10-CM | POA: Diagnosis not present

## 2021-04-03 DIAGNOSIS — E1165 Type 2 diabetes mellitus with hyperglycemia: Secondary | ICD-10-CM | POA: Diagnosis not present

## 2021-04-04 DIAGNOSIS — D509 Iron deficiency anemia, unspecified: Secondary | ICD-10-CM | POA: Diagnosis not present

## 2021-04-04 DIAGNOSIS — N390 Urinary tract infection, site not specified: Secondary | ICD-10-CM | POA: Diagnosis not present

## 2021-04-04 DIAGNOSIS — N184 Chronic kidney disease, stage 4 (severe): Secondary | ICD-10-CM | POA: Diagnosis not present

## 2021-04-09 DIAGNOSIS — R319 Hematuria, unspecified: Secondary | ICD-10-CM | POA: Diagnosis not present

## 2021-04-09 DIAGNOSIS — E119 Type 2 diabetes mellitus without complications: Secondary | ICD-10-CM | POA: Diagnosis not present

## 2021-04-09 DIAGNOSIS — E559 Vitamin D deficiency, unspecified: Secondary | ICD-10-CM | POA: Diagnosis not present

## 2021-04-09 DIAGNOSIS — E039 Hypothyroidism, unspecified: Secondary | ICD-10-CM | POA: Diagnosis not present

## 2021-04-09 DIAGNOSIS — F32A Depression, unspecified: Secondary | ICD-10-CM | POA: Diagnosis not present

## 2021-04-09 DIAGNOSIS — E785 Hyperlipidemia, unspecified: Secondary | ICD-10-CM | POA: Diagnosis not present

## 2021-04-09 DIAGNOSIS — I10 Essential (primary) hypertension: Secondary | ICD-10-CM | POA: Diagnosis not present

## 2021-04-09 DIAGNOSIS — N390 Urinary tract infection, site not specified: Secondary | ICD-10-CM | POA: Diagnosis not present

## 2021-04-11 DIAGNOSIS — Z89511 Acquired absence of right leg below knee: Secondary | ICD-10-CM | POA: Diagnosis not present

## 2021-04-11 DIAGNOSIS — Z89512 Acquired absence of left leg below knee: Secondary | ICD-10-CM | POA: Diagnosis not present

## 2021-04-12 DIAGNOSIS — F32A Depression, unspecified: Secondary | ICD-10-CM | POA: Diagnosis not present

## 2021-04-12 DIAGNOSIS — R52 Pain, unspecified: Secondary | ICD-10-CM | POA: Diagnosis not present

## 2021-04-12 DIAGNOSIS — E785 Hyperlipidemia, unspecified: Secondary | ICD-10-CM | POA: Diagnosis not present

## 2021-04-12 DIAGNOSIS — E119 Type 2 diabetes mellitus without complications: Secondary | ICD-10-CM | POA: Diagnosis not present

## 2021-04-12 DIAGNOSIS — E039 Hypothyroidism, unspecified: Secondary | ICD-10-CM | POA: Diagnosis not present

## 2021-04-12 DIAGNOSIS — I10 Essential (primary) hypertension: Secondary | ICD-10-CM | POA: Diagnosis not present

## 2021-04-12 DIAGNOSIS — E559 Vitamin D deficiency, unspecified: Secondary | ICD-10-CM | POA: Diagnosis not present

## 2021-04-17 DIAGNOSIS — E118 Type 2 diabetes mellitus with unspecified complications: Secondary | ICD-10-CM | POA: Diagnosis not present

## 2021-04-17 DIAGNOSIS — R2689 Other abnormalities of gait and mobility: Secondary | ICD-10-CM | POA: Diagnosis not present

## 2021-04-17 DIAGNOSIS — M6281 Muscle weakness (generalized): Secondary | ICD-10-CM | POA: Diagnosis not present

## 2021-04-18 DIAGNOSIS — E118 Type 2 diabetes mellitus with unspecified complications: Secondary | ICD-10-CM | POA: Diagnosis not present

## 2021-04-18 DIAGNOSIS — R2689 Other abnormalities of gait and mobility: Secondary | ICD-10-CM | POA: Diagnosis not present

## 2021-04-18 DIAGNOSIS — M6281 Muscle weakness (generalized): Secondary | ICD-10-CM | POA: Diagnosis not present

## 2021-04-19 DIAGNOSIS — E559 Vitamin D deficiency, unspecified: Secondary | ICD-10-CM | POA: Diagnosis not present

## 2021-04-19 DIAGNOSIS — E118 Type 2 diabetes mellitus with unspecified complications: Secondary | ICD-10-CM | POA: Diagnosis not present

## 2021-04-19 DIAGNOSIS — Z89511 Acquired absence of right leg below knee: Secondary | ICD-10-CM | POA: Diagnosis not present

## 2021-04-19 DIAGNOSIS — Z89512 Acquired absence of left leg below knee: Secondary | ICD-10-CM | POA: Diagnosis not present

## 2021-04-19 DIAGNOSIS — R52 Pain, unspecified: Secondary | ICD-10-CM | POA: Diagnosis not present

## 2021-04-19 DIAGNOSIS — Z79899 Other long term (current) drug therapy: Secondary | ICD-10-CM | POA: Diagnosis not present

## 2021-04-19 DIAGNOSIS — E119 Type 2 diabetes mellitus without complications: Secondary | ICD-10-CM | POA: Diagnosis not present

## 2021-04-19 DIAGNOSIS — R2689 Other abnormalities of gait and mobility: Secondary | ICD-10-CM | POA: Diagnosis not present

## 2021-04-19 DIAGNOSIS — E039 Hypothyroidism, unspecified: Secondary | ICD-10-CM | POA: Diagnosis not present

## 2021-04-19 DIAGNOSIS — M6281 Muscle weakness (generalized): Secondary | ICD-10-CM | POA: Diagnosis not present

## 2021-04-19 DIAGNOSIS — I10 Essential (primary) hypertension: Secondary | ICD-10-CM | POA: Diagnosis not present

## 2021-04-20 DIAGNOSIS — E118 Type 2 diabetes mellitus with unspecified complications: Secondary | ICD-10-CM | POA: Diagnosis not present

## 2021-04-20 DIAGNOSIS — R2689 Other abnormalities of gait and mobility: Secondary | ICD-10-CM | POA: Diagnosis not present

## 2021-04-20 DIAGNOSIS — M6281 Muscle weakness (generalized): Secondary | ICD-10-CM | POA: Diagnosis not present

## 2021-04-23 DIAGNOSIS — E785 Hyperlipidemia, unspecified: Secondary | ICD-10-CM | POA: Diagnosis not present

## 2021-04-23 DIAGNOSIS — M6281 Muscle weakness (generalized): Secondary | ICD-10-CM | POA: Diagnosis not present

## 2021-04-23 DIAGNOSIS — E118 Type 2 diabetes mellitus with unspecified complications: Secondary | ICD-10-CM | POA: Diagnosis not present

## 2021-04-23 DIAGNOSIS — E119 Type 2 diabetes mellitus without complications: Secondary | ICD-10-CM | POA: Diagnosis not present

## 2021-04-23 DIAGNOSIS — I10 Essential (primary) hypertension: Secondary | ICD-10-CM | POA: Diagnosis not present

## 2021-04-23 DIAGNOSIS — R2689 Other abnormalities of gait and mobility: Secondary | ICD-10-CM | POA: Diagnosis not present

## 2021-04-23 DIAGNOSIS — E559 Vitamin D deficiency, unspecified: Secondary | ICD-10-CM | POA: Diagnosis not present

## 2021-04-23 DIAGNOSIS — E039 Hypothyroidism, unspecified: Secondary | ICD-10-CM | POA: Diagnosis not present

## 2021-04-23 DIAGNOSIS — Z89512 Acquired absence of left leg below knee: Secondary | ICD-10-CM | POA: Diagnosis not present

## 2021-04-23 DIAGNOSIS — Z89511 Acquired absence of right leg below knee: Secondary | ICD-10-CM | POA: Diagnosis not present

## 2021-04-23 DIAGNOSIS — R21 Rash and other nonspecific skin eruption: Secondary | ICD-10-CM | POA: Diagnosis not present

## 2021-04-24 DIAGNOSIS — E118 Type 2 diabetes mellitus with unspecified complications: Secondary | ICD-10-CM | POA: Diagnosis not present

## 2021-04-24 DIAGNOSIS — M6281 Muscle weakness (generalized): Secondary | ICD-10-CM | POA: Diagnosis not present

## 2021-04-24 DIAGNOSIS — R2689 Other abnormalities of gait and mobility: Secondary | ICD-10-CM | POA: Diagnosis not present

## 2021-04-26 DIAGNOSIS — M6281 Muscle weakness (generalized): Secondary | ICD-10-CM | POA: Diagnosis not present

## 2021-04-26 DIAGNOSIS — R2689 Other abnormalities of gait and mobility: Secondary | ICD-10-CM | POA: Diagnosis not present

## 2021-04-26 DIAGNOSIS — E118 Type 2 diabetes mellitus with unspecified complications: Secondary | ICD-10-CM | POA: Diagnosis not present

## 2021-04-30 DIAGNOSIS — R2689 Other abnormalities of gait and mobility: Secondary | ICD-10-CM | POA: Diagnosis not present

## 2021-04-30 DIAGNOSIS — M6281 Muscle weakness (generalized): Secondary | ICD-10-CM | POA: Diagnosis not present

## 2021-04-30 DIAGNOSIS — E118 Type 2 diabetes mellitus with unspecified complications: Secondary | ICD-10-CM | POA: Diagnosis not present

## 2021-05-01 DIAGNOSIS — E118 Type 2 diabetes mellitus with unspecified complications: Secondary | ICD-10-CM | POA: Diagnosis not present

## 2021-05-01 DIAGNOSIS — M6281 Muscle weakness (generalized): Secondary | ICD-10-CM | POA: Diagnosis not present

## 2021-05-01 DIAGNOSIS — R2689 Other abnormalities of gait and mobility: Secondary | ICD-10-CM | POA: Diagnosis not present

## 2021-05-02 DIAGNOSIS — E1165 Type 2 diabetes mellitus with hyperglycemia: Secondary | ICD-10-CM | POA: Diagnosis not present

## 2021-05-03 DIAGNOSIS — R2689 Other abnormalities of gait and mobility: Secondary | ICD-10-CM | POA: Diagnosis not present

## 2021-05-03 DIAGNOSIS — E118 Type 2 diabetes mellitus with unspecified complications: Secondary | ICD-10-CM | POA: Diagnosis not present

## 2021-05-03 DIAGNOSIS — M6281 Muscle weakness (generalized): Secondary | ICD-10-CM | POA: Diagnosis not present

## 2021-05-04 DIAGNOSIS — D509 Iron deficiency anemia, unspecified: Secondary | ICD-10-CM | POA: Diagnosis not present

## 2021-05-07 DIAGNOSIS — E785 Hyperlipidemia, unspecified: Secondary | ICD-10-CM | POA: Diagnosis not present

## 2021-05-07 DIAGNOSIS — I10 Essential (primary) hypertension: Secondary | ICD-10-CM | POA: Diagnosis not present

## 2021-05-07 DIAGNOSIS — Z79899 Other long term (current) drug therapy: Secondary | ICD-10-CM | POA: Diagnosis not present

## 2021-05-07 DIAGNOSIS — R799 Abnormal finding of blood chemistry, unspecified: Secondary | ICD-10-CM | POA: Diagnosis not present

## 2021-05-07 DIAGNOSIS — R2689 Other abnormalities of gait and mobility: Secondary | ICD-10-CM | POA: Diagnosis not present

## 2021-05-07 DIAGNOSIS — E559 Vitamin D deficiency, unspecified: Secondary | ICD-10-CM | POA: Diagnosis not present

## 2021-05-07 DIAGNOSIS — M6281 Muscle weakness (generalized): Secondary | ICD-10-CM | POA: Diagnosis not present

## 2021-05-07 DIAGNOSIS — E039 Hypothyroidism, unspecified: Secondary | ICD-10-CM | POA: Diagnosis not present

## 2021-05-07 DIAGNOSIS — E118 Type 2 diabetes mellitus with unspecified complications: Secondary | ICD-10-CM | POA: Diagnosis not present

## 2021-05-07 DIAGNOSIS — F32A Depression, unspecified: Secondary | ICD-10-CM | POA: Diagnosis not present

## 2021-05-07 DIAGNOSIS — E119 Type 2 diabetes mellitus without complications: Secondary | ICD-10-CM | POA: Diagnosis not present

## 2021-05-08 DIAGNOSIS — E118 Type 2 diabetes mellitus with unspecified complications: Secondary | ICD-10-CM | POA: Diagnosis not present

## 2021-05-08 DIAGNOSIS — R2689 Other abnormalities of gait and mobility: Secondary | ICD-10-CM | POA: Diagnosis not present

## 2021-05-08 DIAGNOSIS — M6281 Muscle weakness (generalized): Secondary | ICD-10-CM | POA: Diagnosis not present

## 2021-05-09 DIAGNOSIS — R2689 Other abnormalities of gait and mobility: Secondary | ICD-10-CM | POA: Diagnosis not present

## 2021-05-09 DIAGNOSIS — M6281 Muscle weakness (generalized): Secondary | ICD-10-CM | POA: Diagnosis not present

## 2021-05-09 DIAGNOSIS — E118 Type 2 diabetes mellitus with unspecified complications: Secondary | ICD-10-CM | POA: Diagnosis not present

## 2021-05-10 DIAGNOSIS — R2689 Other abnormalities of gait and mobility: Secondary | ICD-10-CM | POA: Diagnosis not present

## 2021-05-10 DIAGNOSIS — M6281 Muscle weakness (generalized): Secondary | ICD-10-CM | POA: Diagnosis not present

## 2021-05-10 DIAGNOSIS — E118 Type 2 diabetes mellitus with unspecified complications: Secondary | ICD-10-CM | POA: Diagnosis not present

## 2021-05-11 DIAGNOSIS — E1122 Type 2 diabetes mellitus with diabetic chronic kidney disease: Secondary | ICD-10-CM | POA: Diagnosis not present

## 2021-05-11 DIAGNOSIS — I10 Essential (primary) hypertension: Secondary | ICD-10-CM | POA: Diagnosis not present

## 2021-05-11 DIAGNOSIS — N1831 Chronic kidney disease, stage 3a: Secondary | ICD-10-CM | POA: Diagnosis not present

## 2021-05-11 DIAGNOSIS — E113599 Type 2 diabetes mellitus with proliferative diabetic retinopathy without macular edema, unspecified eye: Secondary | ICD-10-CM | POA: Diagnosis not present

## 2021-05-11 DIAGNOSIS — Z299 Encounter for prophylactic measures, unspecified: Secondary | ICD-10-CM | POA: Diagnosis not present

## 2021-05-15 DIAGNOSIS — R2689 Other abnormalities of gait and mobility: Secondary | ICD-10-CM | POA: Diagnosis not present

## 2021-05-15 DIAGNOSIS — M6281 Muscle weakness (generalized): Secondary | ICD-10-CM | POA: Diagnosis not present

## 2021-05-15 DIAGNOSIS — E118 Type 2 diabetes mellitus with unspecified complications: Secondary | ICD-10-CM | POA: Diagnosis not present

## 2021-05-17 DIAGNOSIS — R2689 Other abnormalities of gait and mobility: Secondary | ICD-10-CM | POA: Diagnosis not present

## 2021-05-17 DIAGNOSIS — E118 Type 2 diabetes mellitus with unspecified complications: Secondary | ICD-10-CM | POA: Diagnosis not present

## 2021-05-17 DIAGNOSIS — M6281 Muscle weakness (generalized): Secondary | ICD-10-CM | POA: Diagnosis not present

## 2021-05-17 DIAGNOSIS — R197 Diarrhea, unspecified: Secondary | ICD-10-CM | POA: Diagnosis not present

## 2021-05-18 DIAGNOSIS — M6281 Muscle weakness (generalized): Secondary | ICD-10-CM | POA: Diagnosis not present

## 2021-05-18 DIAGNOSIS — E118 Type 2 diabetes mellitus with unspecified complications: Secondary | ICD-10-CM | POA: Diagnosis not present

## 2021-05-18 DIAGNOSIS — R2689 Other abnormalities of gait and mobility: Secondary | ICD-10-CM | POA: Diagnosis not present

## 2021-05-21 DIAGNOSIS — K921 Melena: Secondary | ICD-10-CM | POA: Diagnosis not present

## 2021-05-21 DIAGNOSIS — Z89512 Acquired absence of left leg below knee: Secondary | ICD-10-CM | POA: Diagnosis not present

## 2021-05-21 DIAGNOSIS — R5381 Other malaise: Secondary | ICD-10-CM | POA: Diagnosis not present

## 2021-05-21 DIAGNOSIS — R2689 Other abnormalities of gait and mobility: Secondary | ICD-10-CM | POA: Diagnosis not present

## 2021-05-21 DIAGNOSIS — Z1211 Encounter for screening for malignant neoplasm of colon: Secondary | ICD-10-CM | POA: Diagnosis not present

## 2021-05-21 DIAGNOSIS — E118 Type 2 diabetes mellitus with unspecified complications: Secondary | ICD-10-CM | POA: Diagnosis not present

## 2021-05-21 DIAGNOSIS — E039 Hypothyroidism, unspecified: Secondary | ICD-10-CM | POA: Diagnosis not present

## 2021-05-21 DIAGNOSIS — Z89511 Acquired absence of right leg below knee: Secondary | ICD-10-CM | POA: Diagnosis not present

## 2021-05-21 DIAGNOSIS — M6281 Muscle weakness (generalized): Secondary | ICD-10-CM | POA: Diagnosis not present

## 2021-05-21 DIAGNOSIS — K5900 Constipation, unspecified: Secondary | ICD-10-CM | POA: Diagnosis not present

## 2021-05-21 DIAGNOSIS — E119 Type 2 diabetes mellitus without complications: Secondary | ICD-10-CM | POA: Diagnosis not present

## 2021-05-22 DIAGNOSIS — D509 Iron deficiency anemia, unspecified: Secondary | ICD-10-CM | POA: Diagnosis not present

## 2021-05-23 DIAGNOSIS — R2689 Other abnormalities of gait and mobility: Secondary | ICD-10-CM | POA: Diagnosis not present

## 2021-05-23 DIAGNOSIS — E118 Type 2 diabetes mellitus with unspecified complications: Secondary | ICD-10-CM | POA: Diagnosis not present

## 2021-05-23 DIAGNOSIS — M6281 Muscle weakness (generalized): Secondary | ICD-10-CM | POA: Diagnosis not present

## 2021-05-24 DIAGNOSIS — M6281 Muscle weakness (generalized): Secondary | ICD-10-CM | POA: Diagnosis not present

## 2021-05-24 DIAGNOSIS — E118 Type 2 diabetes mellitus with unspecified complications: Secondary | ICD-10-CM | POA: Diagnosis not present

## 2021-05-24 DIAGNOSIS — R2689 Other abnormalities of gait and mobility: Secondary | ICD-10-CM | POA: Diagnosis not present

## 2021-05-25 DIAGNOSIS — M6281 Muscle weakness (generalized): Secondary | ICD-10-CM | POA: Diagnosis not present

## 2021-05-25 DIAGNOSIS — E118 Type 2 diabetes mellitus with unspecified complications: Secondary | ICD-10-CM | POA: Diagnosis not present

## 2021-05-25 DIAGNOSIS — R2689 Other abnormalities of gait and mobility: Secondary | ICD-10-CM | POA: Diagnosis not present

## 2021-05-28 DIAGNOSIS — R2689 Other abnormalities of gait and mobility: Secondary | ICD-10-CM | POA: Diagnosis not present

## 2021-05-28 DIAGNOSIS — M6281 Muscle weakness (generalized): Secondary | ICD-10-CM | POA: Diagnosis not present

## 2021-05-28 DIAGNOSIS — E118 Type 2 diabetes mellitus with unspecified complications: Secondary | ICD-10-CM | POA: Diagnosis not present

## 2021-05-29 DIAGNOSIS — R2689 Other abnormalities of gait and mobility: Secondary | ICD-10-CM | POA: Diagnosis not present

## 2021-05-29 DIAGNOSIS — M6281 Muscle weakness (generalized): Secondary | ICD-10-CM | POA: Diagnosis not present

## 2021-05-29 DIAGNOSIS — E118 Type 2 diabetes mellitus with unspecified complications: Secondary | ICD-10-CM | POA: Diagnosis not present

## 2021-05-30 DIAGNOSIS — R2689 Other abnormalities of gait and mobility: Secondary | ICD-10-CM | POA: Diagnosis not present

## 2021-05-30 DIAGNOSIS — E118 Type 2 diabetes mellitus with unspecified complications: Secondary | ICD-10-CM | POA: Diagnosis not present

## 2021-05-30 DIAGNOSIS — M6281 Muscle weakness (generalized): Secondary | ICD-10-CM | POA: Diagnosis not present

## 2021-06-01 DIAGNOSIS — M6281 Muscle weakness (generalized): Secondary | ICD-10-CM | POA: Diagnosis not present

## 2021-06-01 DIAGNOSIS — E118 Type 2 diabetes mellitus with unspecified complications: Secondary | ICD-10-CM | POA: Diagnosis not present

## 2021-06-01 DIAGNOSIS — R2689 Other abnormalities of gait and mobility: Secondary | ICD-10-CM | POA: Diagnosis not present

## 2021-06-02 DIAGNOSIS — E1165 Type 2 diabetes mellitus with hyperglycemia: Secondary | ICD-10-CM | POA: Diagnosis not present

## 2021-06-04 DIAGNOSIS — E119 Type 2 diabetes mellitus without complications: Secondary | ICD-10-CM | POA: Diagnosis not present

## 2021-06-04 DIAGNOSIS — I10 Essential (primary) hypertension: Secondary | ICD-10-CM | POA: Diagnosis not present

## 2021-06-04 DIAGNOSIS — E785 Hyperlipidemia, unspecified: Secondary | ICD-10-CM | POA: Diagnosis not present

## 2021-06-04 DIAGNOSIS — F32A Depression, unspecified: Secondary | ICD-10-CM | POA: Diagnosis not present

## 2021-06-04 DIAGNOSIS — E559 Vitamin D deficiency, unspecified: Secondary | ICD-10-CM | POA: Diagnosis not present

## 2021-06-04 DIAGNOSIS — R52 Pain, unspecified: Secondary | ICD-10-CM | POA: Diagnosis not present

## 2021-06-04 DIAGNOSIS — K219 Gastro-esophageal reflux disease without esophagitis: Secondary | ICD-10-CM | POA: Diagnosis not present

## 2021-06-04 DIAGNOSIS — E039 Hypothyroidism, unspecified: Secondary | ICD-10-CM | POA: Diagnosis not present

## 2021-06-05 DIAGNOSIS — M6281 Muscle weakness (generalized): Secondary | ICD-10-CM | POA: Diagnosis not present

## 2021-06-05 DIAGNOSIS — E118 Type 2 diabetes mellitus with unspecified complications: Secondary | ICD-10-CM | POA: Diagnosis not present

## 2021-06-05 DIAGNOSIS — R2689 Other abnormalities of gait and mobility: Secondary | ICD-10-CM | POA: Diagnosis not present

## 2021-06-06 DIAGNOSIS — M6281 Muscle weakness (generalized): Secondary | ICD-10-CM | POA: Diagnosis not present

## 2021-06-06 DIAGNOSIS — R2689 Other abnormalities of gait and mobility: Secondary | ICD-10-CM | POA: Diagnosis not present

## 2021-06-06 DIAGNOSIS — E118 Type 2 diabetes mellitus with unspecified complications: Secondary | ICD-10-CM | POA: Diagnosis not present

## 2021-06-07 DIAGNOSIS — R2689 Other abnormalities of gait and mobility: Secondary | ICD-10-CM | POA: Diagnosis not present

## 2021-06-07 DIAGNOSIS — M6281 Muscle weakness (generalized): Secondary | ICD-10-CM | POA: Diagnosis not present

## 2021-06-07 DIAGNOSIS — E118 Type 2 diabetes mellitus with unspecified complications: Secondary | ICD-10-CM | POA: Diagnosis not present

## 2021-06-08 DIAGNOSIS — M6281 Muscle weakness (generalized): Secondary | ICD-10-CM | POA: Diagnosis not present

## 2021-06-08 DIAGNOSIS — R2689 Other abnormalities of gait and mobility: Secondary | ICD-10-CM | POA: Diagnosis not present

## 2021-06-08 DIAGNOSIS — E118 Type 2 diabetes mellitus with unspecified complications: Secondary | ICD-10-CM | POA: Diagnosis not present

## 2021-06-12 DIAGNOSIS — M6281 Muscle weakness (generalized): Secondary | ICD-10-CM | POA: Diagnosis not present

## 2021-06-12 DIAGNOSIS — E118 Type 2 diabetes mellitus with unspecified complications: Secondary | ICD-10-CM | POA: Diagnosis not present

## 2021-06-12 DIAGNOSIS — R2689 Other abnormalities of gait and mobility: Secondary | ICD-10-CM | POA: Diagnosis not present

## 2021-06-13 ENCOUNTER — Ambulatory Visit: Payer: Medicare Other | Admitting: Family

## 2021-06-14 DIAGNOSIS — E113593 Type 2 diabetes mellitus with proliferative diabetic retinopathy without macular edema, bilateral: Secondary | ICD-10-CM | POA: Diagnosis not present

## 2021-06-14 DIAGNOSIS — H2513 Age-related nuclear cataract, bilateral: Secondary | ICD-10-CM | POA: Diagnosis not present

## 2021-06-14 DIAGNOSIS — H524 Presbyopia: Secondary | ICD-10-CM | POA: Diagnosis not present

## 2021-06-14 DIAGNOSIS — H43813 Vitreous degeneration, bilateral: Secondary | ICD-10-CM | POA: Diagnosis not present

## 2021-06-19 DIAGNOSIS — M6281 Muscle weakness (generalized): Secondary | ICD-10-CM | POA: Diagnosis not present

## 2021-06-19 DIAGNOSIS — R2689 Other abnormalities of gait and mobility: Secondary | ICD-10-CM | POA: Diagnosis not present

## 2021-06-19 DIAGNOSIS — E118 Type 2 diabetes mellitus with unspecified complications: Secondary | ICD-10-CM | POA: Diagnosis not present

## 2021-06-20 ENCOUNTER — Ambulatory Visit: Payer: Medicare Other | Admitting: Family

## 2021-06-21 ENCOUNTER — Telehealth: Payer: Self-pay | Admitting: Family

## 2021-06-21 DIAGNOSIS — R2689 Other abnormalities of gait and mobility: Secondary | ICD-10-CM | POA: Diagnosis not present

## 2021-06-21 DIAGNOSIS — E118 Type 2 diabetes mellitus with unspecified complications: Secondary | ICD-10-CM | POA: Diagnosis not present

## 2021-06-21 DIAGNOSIS — M6281 Muscle weakness (generalized): Secondary | ICD-10-CM | POA: Diagnosis not present

## 2021-06-21 NOTE — Telephone Encounter (Signed)
Pt no showed her appt with [place] yesterday. I did call Delcie Roch to inform her of this on her VM.

## 2021-06-21 NOTE — Telephone Encounter (Signed)
Received call from [facility] with [facility] needing notes faxed to her from yesterdays visit. The fax number is [phone]

## 2021-06-22 DIAGNOSIS — M6281 Muscle weakness (generalized): Secondary | ICD-10-CM | POA: Diagnosis not present

## 2021-06-22 DIAGNOSIS — R2689 Other abnormalities of gait and mobility: Secondary | ICD-10-CM | POA: Diagnosis not present

## 2021-06-22 DIAGNOSIS — E118 Type 2 diabetes mellitus with unspecified complications: Secondary | ICD-10-CM | POA: Diagnosis not present

## 2021-06-25 DIAGNOSIS — M6281 Muscle weakness (generalized): Secondary | ICD-10-CM | POA: Diagnosis not present

## 2021-06-25 DIAGNOSIS — E118 Type 2 diabetes mellitus with unspecified complications: Secondary | ICD-10-CM | POA: Diagnosis not present

## 2021-06-25 DIAGNOSIS — R2689 Other abnormalities of gait and mobility: Secondary | ICD-10-CM | POA: Diagnosis not present

## 2021-06-26 DIAGNOSIS — R2689 Other abnormalities of gait and mobility: Secondary | ICD-10-CM | POA: Diagnosis not present

## 2021-06-26 DIAGNOSIS — M6281 Muscle weakness (generalized): Secondary | ICD-10-CM | POA: Diagnosis not present

## 2021-06-26 DIAGNOSIS — E118 Type 2 diabetes mellitus with unspecified complications: Secondary | ICD-10-CM | POA: Diagnosis not present

## 2021-06-27 DIAGNOSIS — M6281 Muscle weakness (generalized): Secondary | ICD-10-CM | POA: Diagnosis not present

## 2021-06-27 DIAGNOSIS — R2689 Other abnormalities of gait and mobility: Secondary | ICD-10-CM | POA: Diagnosis not present

## 2021-06-27 DIAGNOSIS — E118 Type 2 diabetes mellitus with unspecified complications: Secondary | ICD-10-CM | POA: Diagnosis not present

## 2021-06-28 DIAGNOSIS — M6281 Muscle weakness (generalized): Secondary | ICD-10-CM | POA: Diagnosis not present

## 2021-06-28 DIAGNOSIS — E118 Type 2 diabetes mellitus with unspecified complications: Secondary | ICD-10-CM | POA: Diagnosis not present

## 2021-06-28 DIAGNOSIS — R2689 Other abnormalities of gait and mobility: Secondary | ICD-10-CM | POA: Diagnosis not present

## 2021-06-29 DIAGNOSIS — E118 Type 2 diabetes mellitus with unspecified complications: Secondary | ICD-10-CM | POA: Diagnosis not present

## 2021-06-29 DIAGNOSIS — M6281 Muscle weakness (generalized): Secondary | ICD-10-CM | POA: Diagnosis not present

## 2021-06-29 DIAGNOSIS — R2689 Other abnormalities of gait and mobility: Secondary | ICD-10-CM | POA: Diagnosis not present

## 2021-07-02 DIAGNOSIS — E1165 Type 2 diabetes mellitus with hyperglycemia: Secondary | ICD-10-CM | POA: Diagnosis not present

## 2021-07-03 DIAGNOSIS — R2689 Other abnormalities of gait and mobility: Secondary | ICD-10-CM | POA: Diagnosis not present

## 2021-07-03 DIAGNOSIS — E118 Type 2 diabetes mellitus with unspecified complications: Secondary | ICD-10-CM | POA: Diagnosis not present

## 2021-07-03 DIAGNOSIS — M6281 Muscle weakness (generalized): Secondary | ICD-10-CM | POA: Diagnosis not present

## 2021-07-04 DIAGNOSIS — M6281 Muscle weakness (generalized): Secondary | ICD-10-CM | POA: Diagnosis not present

## 2021-07-04 DIAGNOSIS — E118 Type 2 diabetes mellitus with unspecified complications: Secondary | ICD-10-CM | POA: Diagnosis not present

## 2021-07-04 DIAGNOSIS — R2689 Other abnormalities of gait and mobility: Secondary | ICD-10-CM | POA: Diagnosis not present

## 2021-07-06 DIAGNOSIS — R2689 Other abnormalities of gait and mobility: Secondary | ICD-10-CM | POA: Diagnosis not present

## 2021-07-06 DIAGNOSIS — E118 Type 2 diabetes mellitus with unspecified complications: Secondary | ICD-10-CM | POA: Diagnosis not present

## 2021-07-06 DIAGNOSIS — M6281 Muscle weakness (generalized): Secondary | ICD-10-CM | POA: Diagnosis not present

## 2021-07-09 DIAGNOSIS — R2689 Other abnormalities of gait and mobility: Secondary | ICD-10-CM | POA: Diagnosis not present

## 2021-07-09 DIAGNOSIS — M6281 Muscle weakness (generalized): Secondary | ICD-10-CM | POA: Diagnosis not present

## 2021-07-09 DIAGNOSIS — E118 Type 2 diabetes mellitus with unspecified complications: Secondary | ICD-10-CM | POA: Diagnosis not present

## 2021-07-10 DIAGNOSIS — E118 Type 2 diabetes mellitus with unspecified complications: Secondary | ICD-10-CM | POA: Diagnosis not present

## 2021-07-10 DIAGNOSIS — R2689 Other abnormalities of gait and mobility: Secondary | ICD-10-CM | POA: Diagnosis not present

## 2021-07-10 DIAGNOSIS — U071 COVID-19: Secondary | ICD-10-CM | POA: Diagnosis not present

## 2021-07-10 DIAGNOSIS — M6281 Muscle weakness (generalized): Secondary | ICD-10-CM | POA: Diagnosis not present

## 2021-07-11 ENCOUNTER — Ambulatory Visit: Payer: Medicare Other | Admitting: "Endocrinology

## 2021-07-12 DIAGNOSIS — E118 Type 2 diabetes mellitus with unspecified complications: Secondary | ICD-10-CM | POA: Diagnosis not present

## 2021-07-12 DIAGNOSIS — F32A Depression, unspecified: Secondary | ICD-10-CM | POA: Diagnosis not present

## 2021-07-12 DIAGNOSIS — M6281 Muscle weakness (generalized): Secondary | ICD-10-CM | POA: Diagnosis not present

## 2021-07-12 DIAGNOSIS — E785 Hyperlipidemia, unspecified: Secondary | ICD-10-CM | POA: Diagnosis not present

## 2021-07-12 DIAGNOSIS — K219 Gastro-esophageal reflux disease without esophagitis: Secondary | ICD-10-CM | POA: Diagnosis not present

## 2021-07-12 DIAGNOSIS — E559 Vitamin D deficiency, unspecified: Secondary | ICD-10-CM | POA: Diagnosis not present

## 2021-07-12 DIAGNOSIS — R52 Pain, unspecified: Secondary | ICD-10-CM | POA: Diagnosis not present

## 2021-07-12 DIAGNOSIS — U071 COVID-19: Secondary | ICD-10-CM | POA: Diagnosis not present

## 2021-07-12 DIAGNOSIS — R2689 Other abnormalities of gait and mobility: Secondary | ICD-10-CM | POA: Diagnosis not present

## 2021-07-12 DIAGNOSIS — E039 Hypothyroidism, unspecified: Secondary | ICD-10-CM | POA: Diagnosis not present

## 2021-07-12 DIAGNOSIS — E119 Type 2 diabetes mellitus without complications: Secondary | ICD-10-CM | POA: Diagnosis not present

## 2021-07-13 DIAGNOSIS — M6281 Muscle weakness (generalized): Secondary | ICD-10-CM | POA: Diagnosis not present

## 2021-07-13 DIAGNOSIS — R2689 Other abnormalities of gait and mobility: Secondary | ICD-10-CM | POA: Diagnosis not present

## 2021-07-13 DIAGNOSIS — E118 Type 2 diabetes mellitus with unspecified complications: Secondary | ICD-10-CM | POA: Diagnosis not present

## 2021-07-16 DIAGNOSIS — E118 Type 2 diabetes mellitus with unspecified complications: Secondary | ICD-10-CM | POA: Diagnosis not present

## 2021-07-16 DIAGNOSIS — M6281 Muscle weakness (generalized): Secondary | ICD-10-CM | POA: Diagnosis not present

## 2021-07-16 DIAGNOSIS — R2689 Other abnormalities of gait and mobility: Secondary | ICD-10-CM | POA: Diagnosis not present

## 2021-07-17 DIAGNOSIS — E118 Type 2 diabetes mellitus with unspecified complications: Secondary | ICD-10-CM | POA: Diagnosis not present

## 2021-07-17 DIAGNOSIS — R2689 Other abnormalities of gait and mobility: Secondary | ICD-10-CM | POA: Diagnosis not present

## 2021-07-17 DIAGNOSIS — M6281 Muscle weakness (generalized): Secondary | ICD-10-CM | POA: Diagnosis not present

## 2021-07-18 DIAGNOSIS — E118 Type 2 diabetes mellitus with unspecified complications: Secondary | ICD-10-CM | POA: Diagnosis not present

## 2021-07-18 DIAGNOSIS — M6281 Muscle weakness (generalized): Secondary | ICD-10-CM | POA: Diagnosis not present

## 2021-07-18 DIAGNOSIS — R2689 Other abnormalities of gait and mobility: Secondary | ICD-10-CM | POA: Diagnosis not present

## 2021-07-19 DIAGNOSIS — E118 Type 2 diabetes mellitus with unspecified complications: Secondary | ICD-10-CM | POA: Diagnosis not present

## 2021-07-19 DIAGNOSIS — E785 Hyperlipidemia, unspecified: Secondary | ICD-10-CM | POA: Diagnosis not present

## 2021-07-19 DIAGNOSIS — E559 Vitamin D deficiency, unspecified: Secondary | ICD-10-CM | POA: Diagnosis not present

## 2021-07-19 DIAGNOSIS — U071 COVID-19: Secondary | ICD-10-CM | POA: Diagnosis not present

## 2021-07-19 DIAGNOSIS — K219 Gastro-esophageal reflux disease without esophagitis: Secondary | ICD-10-CM | POA: Diagnosis not present

## 2021-07-19 DIAGNOSIS — M6281 Muscle weakness (generalized): Secondary | ICD-10-CM | POA: Diagnosis not present

## 2021-07-19 DIAGNOSIS — I10 Essential (primary) hypertension: Secondary | ICD-10-CM | POA: Diagnosis not present

## 2021-07-19 DIAGNOSIS — E119 Type 2 diabetes mellitus without complications: Secondary | ICD-10-CM | POA: Diagnosis not present

## 2021-07-19 DIAGNOSIS — R2689 Other abnormalities of gait and mobility: Secondary | ICD-10-CM | POA: Diagnosis not present

## 2021-07-19 DIAGNOSIS — F32A Depression, unspecified: Secondary | ICD-10-CM | POA: Diagnosis not present

## 2021-07-19 DIAGNOSIS — E039 Hypothyroidism, unspecified: Secondary | ICD-10-CM | POA: Diagnosis not present

## 2021-07-23 DIAGNOSIS — R2689 Other abnormalities of gait and mobility: Secondary | ICD-10-CM | POA: Diagnosis not present

## 2021-07-23 DIAGNOSIS — M6281 Muscle weakness (generalized): Secondary | ICD-10-CM | POA: Diagnosis not present

## 2021-07-23 DIAGNOSIS — E118 Type 2 diabetes mellitus with unspecified complications: Secondary | ICD-10-CM | POA: Diagnosis not present

## 2021-07-24 DIAGNOSIS — E118 Type 2 diabetes mellitus with unspecified complications: Secondary | ICD-10-CM | POA: Diagnosis not present

## 2021-07-24 DIAGNOSIS — M6281 Muscle weakness (generalized): Secondary | ICD-10-CM | POA: Diagnosis not present

## 2021-07-24 DIAGNOSIS — R2689 Other abnormalities of gait and mobility: Secondary | ICD-10-CM | POA: Diagnosis not present

## 2021-07-25 DIAGNOSIS — E118 Type 2 diabetes mellitus with unspecified complications: Secondary | ICD-10-CM | POA: Diagnosis not present

## 2021-07-25 DIAGNOSIS — M6281 Muscle weakness (generalized): Secondary | ICD-10-CM | POA: Diagnosis not present

## 2021-07-25 DIAGNOSIS — R2689 Other abnormalities of gait and mobility: Secondary | ICD-10-CM | POA: Diagnosis not present

## 2021-07-26 ENCOUNTER — Ambulatory Visit (INDEPENDENT_AMBULATORY_CARE_PROVIDER_SITE_OTHER): Payer: Medicare Other | Admitting: "Endocrinology

## 2021-07-26 ENCOUNTER — Encounter: Payer: Self-pay | Admitting: "Endocrinology

## 2021-07-26 ENCOUNTER — Other Ambulatory Visit: Payer: Self-pay

## 2021-07-26 VITALS — BP 136/76 | HR 72

## 2021-07-26 DIAGNOSIS — E782 Mixed hyperlipidemia: Secondary | ICD-10-CM

## 2021-07-26 DIAGNOSIS — E039 Hypothyroidism, unspecified: Secondary | ICD-10-CM | POA: Diagnosis not present

## 2021-07-26 DIAGNOSIS — E1159 Type 2 diabetes mellitus with other circulatory complications: Secondary | ICD-10-CM | POA: Diagnosis not present

## 2021-07-26 DIAGNOSIS — I10 Essential (primary) hypertension: Secondary | ICD-10-CM | POA: Diagnosis not present

## 2021-07-26 DIAGNOSIS — M6281 Muscle weakness (generalized): Secondary | ICD-10-CM | POA: Diagnosis not present

## 2021-07-26 DIAGNOSIS — E118 Type 2 diabetes mellitus with unspecified complications: Secondary | ICD-10-CM | POA: Diagnosis not present

## 2021-07-26 DIAGNOSIS — R2689 Other abnormalities of gait and mobility: Secondary | ICD-10-CM | POA: Diagnosis not present

## 2021-07-26 LAB — POCT GLYCOSYLATED HEMOGLOBIN (HGB A1C): HbA1c, POC (controlled diabetic range): 7.7 % — AB (ref 0.0–7.0)

## 2021-07-26 MED ORDER — INSULIN GLARGINE 100 UNIT/ML ~~LOC~~ SOLN: 30.0000 [IU] | Freq: Every day | SUBCUTANEOUS | 2 refills | Status: DC

## 2021-07-26 NOTE — Patient Instructions (Signed)

## 2021-07-26 NOTE — Progress Notes (Signed)
07/26/2021  Endocrinology follow-up note  Subjective:    Patient ID: Michelle Skinner, female    DOB: 1966/03/06,    Past Medical History:  Diagnosis Date   Amputee, above knee, left ([place])    Amputee, above knee, right ([place])    Anemia    Cervical cancer ([place])    Depression    Diabetes mellitus, type II ([place])    GERD (gastroesophageal reflux disease)    Hyperlipidemia    Neuropathy    PTSD (post-traumatic stress disorder)    Past Surgical History:  Procedure Laterality Date   ABDOMINAL HYSTERECTOMY     AMPUTATION Bilateral 09/13/2020   Procedure: BILATERAL BELOW KNEE AMPUTATIONS;  Surgeon: [name], MD;  Location: [facility];  Service: Orthopedics;  Laterality: Bilateral;   COLONOSCOPY WITH PROPOFOL N/A 05/25/2018   Procedure: COLONOSCOPY WITH PROPOFOL;  Surgeon: Daneil Dolin, MD;  [facility];  Service: Endoscopy;  Laterality: N/A;  7:30am   I & D EXTREMITY Bilateral 09/10/2020   Procedure: IRRIGATION AND DEBRIDEMENT FEET;;  Surgeon: [name], MD;  Location: [facility];  Service: Orthopedics;  Laterality: Bilateral;   IRRIGATION AND DEBRIDEMENT FOOT Bilateral 09/08/2020   Procedure: IRRIGATION AND DEBRIDEMENT FOOT;  Surgeon: Virl Cagey, MD;  Location: [facility];  Service: General;  Laterality: Bilateral;  left foot 11 x 10.5 x 1    OTHER SURGICAL HISTORY Right    R Foot- I&D   POLYPECTOMY  05/25/2018   Procedure: POLYPECTOMY;  Surgeon: Daneil Dolin, MD;  [facility];  Service: Endoscopy;;  colon   UMBILICAL HERNIA REPAIR  2007   Social History   Socioeconomic History   Marital status: Married    Spouse name: Not on file   Number of children: Not on file   Years of education: Not on file   Highest education level: Not on file  Occupational History   Not on file  Tobacco Use   Smoking status: Never   Smokeless tobacco: Never  Vaping Use   Vaping Use: Never used  Substance and Sexual Activity   Alcohol use: No    Alcohol/week: 0.0  standard drinks   Drug use: No   Sexual activity: Yes    Birth control/protection: Surgical  Other Topics Concern   Not on file  Social History Narrative   Not on file   Social Determinants of Health   Financial Resource Strain: Not on file  Food Insecurity: Not on file  Transportation Needs: Not on file  Physical Activity: Not on file  Stress: Not on file  Social Connections: Not on file   Outpatient Encounter Medications as of 07/26/2021  Medication Sig   Ascorbic Acid (VITAMIN C PO) Take 1 tablet by mouth at bedtime.   atorvastatin (LIPITOR) 40 MG tablet Take 1 tablet (40 mg total) by mouth at bedtime.   Calcium Carbonate (CALCIUM 500 PO) Take 1 tablet by mouth daily.   carvedilol (COREG) 12.5 MG tablet Take 1 tablet (12.5 mg total) by mouth 2 (two) times daily.   Cholecalciferol (VITAMIN D3) 125 MCG (5000 UT) CAPS Take 1 capsule (5,000 Units total) by mouth daily.   Continuous Blood Gluc Receiver (FREESTYLE LIBRE 2 READER) DEVI As directed   Continuous Blood Gluc Sensor (FREESTYLE LIBRE 2 SENSOR) MISC 1 Piece by Does not apply route every 14 (fourteen) days.   enoxaparin (LOVENOX) 80 MG/0.8ML injection Inject 0.7 mLs (70 mg total) into the skin daily.   Ferrous Sulfate (IRON  PO) Take 1 tablet by mouth at bedtime.   Ferrous Sulfate (IRON) 325 (65 Fe) MG TABS Take 1 tablet by mouth at bedtime.   FLUoxetine (PROZAC) 40 MG capsule Take 40 mg by mouth at bedtime.   folic acid (FOLVITE) 1 MG tablet Take 1 tablet (1 mg total) by mouth daily.   gabapentin (NEURONTIN) 300 MG capsule Take 1 capsule (300 mg total) by mouth 3 (three) times daily. (Patient not taking: Reported on 07/26/2021)   insulin glargine (LANTUS) 100 UNIT/ML injection Inject 0.3 mLs (30 Units total) into the skin at bedtime.   insulin lispro (HUMALOG) 100 UNIT/ML KwikPen Inject 5-11 Units into the skin 3 (three) times daily before meals.   levothyroxine (SYNTHROID) 150 MCG tablet Take 1 tablet (150 mcg total) by mouth  daily before breakfast.   lisinopril (ZESTRIL) 20 MG tablet Take 20 mg by mouth at bedtime.   magnesium citrate SOLN Take 296 mLs (1 Bottle total) by mouth daily as needed for severe constipation.   Multiple Vitamins-Minerals (PRESERVISION AREDS 2) CAPS Take 1 capsule by mouth 2 (two) times daily.   neomycin-bacitracin-polymyxin (NEOSPORIN) ointment Apply 1 application topically every 12 (twelve) hours. To legs   nutrition supplement, JUVEN, (JUVEN) PACK Take 1 packet by mouth 2 (two) times daily between meals.   ondansetron (ZOFRAN) 4 MG tablet Take 4 mg by mouth every 8 (eight) hours as needed for nausea or vomiting.   Oxycodone HCl 10 MG TABS Take 10 mg by mouth every 6 (six) hours as needed.   pantoprazole (PROTONIX) 40 MG tablet TAKE ONE (1) TABLET EACH DAY (Patient taking differently: Take 40 mg by mouth at bedtime.)   polyethylene glycol (MIRALAX / GLYCOLAX) 17 g packet Take 17 g by mouth 2 (two) times daily as needed for mild constipation.   promethazine (PHENERGAN) 25 MG suppository Place 1 suppository (25 mg total) rectally every 6 (six) hours as needed for nausea or vomiting.   senna-docusate (SENOKOT-S) 8.6-50 MG tablet Take 1 tablet by mouth 2 (two) times daily as needed for moderate constipation.   Thiamine HCl (VITAMIN B-1 PO) Take 1 tablet by mouth daily.   [DISCONTINUED] insulin glargine (LANTUS) 100 UNIT/ML injection Inject 0.26 mLs (26 Units total) into the skin at bedtime.   No facility-administered encounter medications on file as of 07/26/2021.   ALLERGIES: Allergies  Allergen Reactions   Sulfa Antibiotics Diarrhea, Nausea And Vomiting and Rash   Clindamycin/Lincomycin Diarrhea and Nausea And Vomiting   Invokana [Canagliflozin] Diarrhea and Nausea And Vomiting   Latex     Rash, itching, over a long period of time   Tape Itching and Swelling    Reaction after a few days use/ please use paper tape   Bactrim [Sulfamethoxazole-Trimethoprim] Diarrhea, Nausea And Vomiting  and Rash   Cherry Itching, Swelling and Rash    Throat swelling   Gabapentin Diarrhea, Nausea And Vomiting and Rash   Other Itching, Swelling and Rash    Reaction to Hot peppers (throat swelling)   VACCINATION STATUS: Immunization History  Administered Date(s) Administered   Influenza Split 08/15/2014   Influenza,inj,Quad PF,6+ Mos 06/10/2017, 04/30/2020   Tdap 09/02/2014    Diabetes She presents for her follow-up diabetic visit. She has type 2 diabetes mellitus. Onset time: She was diagnosed at approximate age of 52 years. 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 There  are no hypoglycemic associated symptoms. Pertinent negatives for hypoglycemia include no confusion, headaches, pallor or seizures. Pertinent negatives for diabetes include no chest pain, no fatigue, no polydipsia, no polyphagia and no polyuria. There are no hypoglycemic complications. Symptoms are worsening. Diabetic complications include PVD and retinopathy. Pertinent negatives for diabetic complications include no autonomic neuropathy or peripheral neuropathy. (She has bilateral Charcot's feet, bilateral lower extremity lymphedema.) Risk factors for coronary artery disease include diabetes mellitus, dyslipidemia, hypertension, obesity and sedentary lifestyle. Current diabetic treatment includes intensive insulin program. She is compliant with treatment most of the time. Her weight is increasing steadily. She is following a generally unhealthy diet. She has not had a previous visit with a dietitian (She missed her appointment.). She never participates in exercise. Her breakfast blood glucose range is generally 180-200 mg/dl. Her lunch blood glucose range is generally 180-200 mg/dl. Her dinner blood glucose range is generally 180-200 mg/dl. Her  bedtime blood glucose range is generally 180-200 mg/dl. Her overall blood glucose range is 180-200 mg/dl. (She returns from her nursing home alone for follow-up.  Her CGM AGP shows 20% target range, 77% above range.  No hypoglycemia.  Her point-of-care A1c if 7.7% increasing from 6.5% during her last visit.  Most recently her average blood glucose is 215  mg per DL.        ) An ACE inhibitor/angiotensin II receptor blocker is not being taken. Eye exam is current.  Hyperlipidemia This is a chronic problem. The current episode started more than 1 year ago. The problem is uncontrolled. Recent lipid tests were reviewed and are high. Exacerbating diseases include diabetes, hypothyroidism and obesity. Pertinent negatives include no chest pain, myalgias or shortness of breath. She is currently on no antihyperlipidemic treatment.  Thyroid Problem Presents for initial visit. Patient reports no cold intolerance, diarrhea, fatigue, heat intolerance or palpitations. The following procedures have not been performed: radioiodine uptake scan and thyroidectomy. Her past medical history is significant for diabetes and hyperlipidemia.   Review of systems   Objective:    BP 136/76    Pulse 72   Wt Readings from Last 3 Encounters:  03/29/21 (!) 333 lb (151 kg)  12/17/20 (!) 326 lb 1 oz (147.9 kg)  10/09/20 (!) 326 lb (147.9 kg)     Physical Exam- Limited  Constitutional: On recliner.  She is status post bilateral below-knee amputation.   Psych: Reluctant affect.  CMP Latest Ref Rng & Units 03/28/2021 12/17/2020 09/28/2020  Glucose 70 - 99 mg/dL - 150(H) 127(H)  BUN 4 - 21 22(A) 38(H) 15  Creatinine 0.5 - 1.1 1.0 1.19(H) 0.70  Sodium 135 - 145 mmol/L - 133(L) 136  Potassium 3.5 - 5.1 mmol/L - 3.5 3.3(L)  Chloride 98 - 111 mmol/L - 97(L) 102  CO2 22 - 32 mmol/L - 25 26  Calcium 8.9 - 10.3 mg/dL - 9.1 7.8(L)  Total Protein 6.5 - 8.1 g/dL - 6.9 -  Total Bilirubin 0.3 - 1.2 mg/dL - 1.1 -  Alkaline Phos  38 - 126 U/L - 93 -  AST 15 - 41 U/L - 15 -  ALT 0 - 44 U/L - 18 -      Diabetic Labs (most recent): Lab Results  Component Value Date   HGBA1C 7.7 (A) 07/26/2021   HGBA1C 6.5 03/29/2021   HGBA1C 6.5 03/29/2021    Lipid Panel     Component Value Date/Time   CHOL 188 07/03/2020 1001   TRIG 192 (H) 07/03/2020 1001   HDL  42 (L) 07/03/2020 1001   CHOLHDL 4.5 07/03/2020 1001   VLDL 42 (H) 04/25/2015 1001   LDLCALC 115 (H) 07/03/2020 1001    Assessment & Plan:   1. Uncontrolled type 2 diabetes mellitus with complication, with long-term current use of insulin ([place])   Her diabetes is complicated by bilateral peripheral neuropathy , lateral Charcot's feet status post bilateral below-knee amputation due to complicated cellulitis/osteomyelitis, history of noncompliance/nonadherence, and possible retinopathy.  - She has chronically uncontrolled symptomatic type 2 DM of approximately since age 63 years duration.  She returns from her nursing home alone for follow-up.  Her CGM AGP shows 20% target range, 77% above range.  No hypoglycemia.  Her point-of-care A1c if 7.7% increasing from 6.5% during her last visit.  Most recently her average blood glucose is 215  mg per DL.     -She remains at extremely high risk for more acute and chronic complications of diabetes which include CAD, CVA, CKD, retinopathy, and neuropathy. These are all discussed in detail with the patient.   - I have counseled the patient on diet management and weight loss, by adopting a carbohydrate restricted/protein rich diet.  - Patient is advised to stick to a routine mealtimes to eat 3 meals  a day and avoid unnecessary snacks ( to snack only to correct hypoglycemia).   - she acknowledges that there is a room for improvement in her food and drink choices. - Suggestion is made for her to avoid simple carbohydrates  from her diet including Cakes, Sweet Desserts, Ice Cream, Soda (diet and regular), Sweet Tea, Candies,  Chips, Cookies, Store Bought Juices, Alcohol , Artificial Sweeteners,  Coffee Creamer, and "Sugar-free" Products, Lemonade. This will help patient to have more stable blood glucose profile and potentially avoid unintended weight gain.  The following Lifestyle Medicine recommendations according to [facility]) were discussed and and offered to patient and she  agrees to start the journey:  A. Whole Foods, Plant-Based Nutrition comprising of fruits and vegetables, plant-based proteins, whole-grain carbohydrates was discussed in detail with the patient.   A list for source of those nutrients were also provided to the patient.  Patient will use only water or unsweetened tea for hydration. B.  The need to stay away from risky substances including alcohol, smoking; obtaining 7 to 9 hours of restorative sleep, at least 150 minutes of moderate intensity exercise weekly, the importance of healthy social connections,  and stress management techniques were discussed.   - I encouraged the patient to switch to  unprocessed or minimally processed complex starch and increased protein intake (animal or plant source), fruits, and vegetables.   - I have approached patient with the following individualized plan to manage diabetes and patient agrees:   -She is now using a freestyle libre device.  She will continue to need intensive treatment in order for her to achieve and maintain control of diabetes to target.   -I recommended increasing Lantus to 30 units nightly, continue NovoLog 5-10 units 3 times daily AC for Premeal blood glucose readings above 90 mg per DL. - She is warned not to take insulin without monitoring blood glucose properly.   -Patient is encouraged to call clinic for blood glucose levels less than 70 or above 300 mg /dl. - she is adamant saying that she does not tolerate MTF, SGLT2 inhibitors,  And GLP-1 receptor agonist including [place].  Interpretation of CGM  printouts discussed with her. - Patient specific target  A1c;  LDL, HDL, Triglycerides were discussed in detail.  2) BP/HTN:  -Her blood pressure is controlled to target.   She is advised to take her blood pressure medications in the morning .  Advised to continue lisinopril 20 mg daily, carvedilol 25 mg p.o. twice daily, advised on salt restrictions.     3) Lipids/HPL: Most recent lipid panel showed LDL increasing to 115 from 109.  She is advised to continue Lipitor 40 mg p.o. nightly.    Side effects and precautions discussed with her.       4) vitamin D deficiency:  -She is advised to continue vitamin D2 50,000 units weekly in addition to her regular maintenance dose of vitamin D3 5000 units daily.     5)  Weight/Diet:   Her BMI is 50+ .  Clearly this is complicating her care for diabetes.  She is a candidate for modest weight loss.  She cannot exercise optimally.  She keeps missing her appointment with  CDE exercise, and carbohydrates information provided.   6) hypothyroidism:  -Her recent thyroid function tests are consistent with appropriate replacement.  She is advised to continue levothyroxine 150 mcg p.o. daily before breakfast.    - We discussed about the correct intake of her thyroid hormone, on empty stomach at fasting, with water, separated by at least 30 minutes from breakfast and other medications,  and separated by more than 4 hours from calcium, iron, multivitamins, acid reflux medications (PPIs). -Patient is made aware of the fact that thyroid hormone replacement is needed for life, dose to be adjusted by periodic monitoring of thyroid function tests.   6) Chronic Care/Health Maintenance:  -Patient is advised to continue lisinopril, Lipitor. She is encouraged to continue to follow up with Ophthalmology, Podiatrist at least yearly or according to recommendations, and advised to stay away from smoking. I have recommended yearly flu vaccine and pneumonia vaccination at least  every 5 years; moderate intensity exercise for up to 150 minutes weekly; and  sleep for at least 7 hours a day.   I advised patient to maintain close follow up with their PCP for primary care needs.    I spent 45 minutes in the care of the patient today including review of labs from [place], Lipids, Thyroid Function, Hematology (current and previous including abstractions from other facilities); face-to-face time discussing  her blood glucose readings/logs, discussing hypoglycemia and hyperglycemia episodes and symptoms, medications doses, her options of short and long term treatment based on the latest standards of care / guidelines;  discussion about incorporating lifestyle medicine;  and documenting the encounter.    Please refer to Patient Instructions for Blood Glucose Monitoring and Insulin/Medications Dosing Guide"  in media tab for additional information. Please  also refer to " Patient Self Inventory" in the Media  tab for reviewed elements of pertinent patient history.  Michelle Skinner participated in the discussions, expressed understanding, and voiced agreement with the above plans.  All questions were answered to her satisfaction. she is encouraged to contact clinic should she have any questions or concerns prior to her return visit.  Follow up plan: Return in about 6 months (around 01/23/2022) for F/U with Pre-visit Labs, Meter, Logs, A1c here.Glade Lloyd, MD Phone: [phone]  Fax: [phone]  -  This note was partially dictated with voice recognition software. Similar sounding words can be transcribed inadequately or may not  be corrected upon review.  07/26/2021, 6:21 PM

## 2021-07-30 DIAGNOSIS — M6281 Muscle weakness (generalized): Secondary | ICD-10-CM | POA: Diagnosis not present

## 2021-07-30 DIAGNOSIS — R2689 Other abnormalities of gait and mobility: Secondary | ICD-10-CM | POA: Diagnosis not present

## 2021-07-30 DIAGNOSIS — E118 Type 2 diabetes mellitus with unspecified complications: Secondary | ICD-10-CM | POA: Diagnosis not present

## 2021-07-31 DIAGNOSIS — E118 Type 2 diabetes mellitus with unspecified complications: Secondary | ICD-10-CM | POA: Diagnosis not present

## 2021-07-31 DIAGNOSIS — R2689 Other abnormalities of gait and mobility: Secondary | ICD-10-CM | POA: Diagnosis not present

## 2021-07-31 DIAGNOSIS — M6281 Muscle weakness (generalized): Secondary | ICD-10-CM | POA: Diagnosis not present

## 2021-08-01 DIAGNOSIS — M6281 Muscle weakness (generalized): Secondary | ICD-10-CM | POA: Diagnosis not present

## 2021-08-01 DIAGNOSIS — E118 Type 2 diabetes mellitus with unspecified complications: Secondary | ICD-10-CM | POA: Diagnosis not present

## 2021-08-01 DIAGNOSIS — R2689 Other abnormalities of gait and mobility: Secondary | ICD-10-CM | POA: Diagnosis not present

## 2021-08-02 DIAGNOSIS — E1165 Type 2 diabetes mellitus with hyperglycemia: Secondary | ICD-10-CM | POA: Diagnosis not present

## 2021-08-03 DIAGNOSIS — E118 Type 2 diabetes mellitus with unspecified complications: Secondary | ICD-10-CM | POA: Diagnosis not present

## 2021-08-03 DIAGNOSIS — R2689 Other abnormalities of gait and mobility: Secondary | ICD-10-CM | POA: Diagnosis not present

## 2021-08-03 DIAGNOSIS — M6281 Muscle weakness (generalized): Secondary | ICD-10-CM | POA: Diagnosis not present

## 2021-08-06 DIAGNOSIS — R2689 Other abnormalities of gait and mobility: Secondary | ICD-10-CM | POA: Diagnosis not present

## 2021-08-06 DIAGNOSIS — E118 Type 2 diabetes mellitus with unspecified complications: Secondary | ICD-10-CM | POA: Diagnosis not present

## 2021-08-06 DIAGNOSIS — M6281 Muscle weakness (generalized): Secondary | ICD-10-CM | POA: Diagnosis not present

## 2021-08-07 DIAGNOSIS — M6281 Muscle weakness (generalized): Secondary | ICD-10-CM | POA: Diagnosis not present

## 2021-08-07 DIAGNOSIS — E118 Type 2 diabetes mellitus with unspecified complications: Secondary | ICD-10-CM | POA: Diagnosis not present

## 2021-08-07 DIAGNOSIS — R2689 Other abnormalities of gait and mobility: Secondary | ICD-10-CM | POA: Diagnosis not present

## 2021-08-08 DIAGNOSIS — M6281 Muscle weakness (generalized): Secondary | ICD-10-CM | POA: Diagnosis not present

## 2021-08-08 DIAGNOSIS — R2689 Other abnormalities of gait and mobility: Secondary | ICD-10-CM | POA: Diagnosis not present

## 2021-08-08 DIAGNOSIS — E118 Type 2 diabetes mellitus with unspecified complications: Secondary | ICD-10-CM | POA: Diagnosis not present

## 2021-08-09 DIAGNOSIS — E103493 Type 1 diabetes mellitus with severe nonproliferative diabetic retinopathy without macular edema, bilateral: Secondary | ICD-10-CM | POA: Diagnosis not present

## 2021-08-09 DIAGNOSIS — Z794 Long term (current) use of insulin: Secondary | ICD-10-CM | POA: Diagnosis not present

## 2021-08-09 DIAGNOSIS — E118 Type 2 diabetes mellitus with unspecified complications: Secondary | ICD-10-CM | POA: Diagnosis not present

## 2021-08-09 DIAGNOSIS — R2689 Other abnormalities of gait and mobility: Secondary | ICD-10-CM | POA: Diagnosis not present

## 2021-08-09 DIAGNOSIS — H2513 Age-related nuclear cataract, bilateral: Secondary | ICD-10-CM | POA: Diagnosis not present

## 2021-08-09 DIAGNOSIS — H5203 Hypermetropia, bilateral: Secondary | ICD-10-CM | POA: Diagnosis not present

## 2021-08-09 DIAGNOSIS — M6281 Muscle weakness (generalized): Secondary | ICD-10-CM | POA: Diagnosis not present

## 2021-08-09 DIAGNOSIS — H524 Presbyopia: Secondary | ICD-10-CM | POA: Diagnosis not present

## 2021-08-09 LAB — HM DIABETES EYE EXAM

## 2021-08-13 DIAGNOSIS — M6281 Muscle weakness (generalized): Secondary | ICD-10-CM | POA: Diagnosis not present

## 2021-08-13 DIAGNOSIS — E118 Type 2 diabetes mellitus with unspecified complications: Secondary | ICD-10-CM | POA: Diagnosis not present

## 2021-08-13 DIAGNOSIS — R2689 Other abnormalities of gait and mobility: Secondary | ICD-10-CM | POA: Diagnosis not present

## 2021-08-14 DIAGNOSIS — E118 Type 2 diabetes mellitus with unspecified complications: Secondary | ICD-10-CM | POA: Diagnosis not present

## 2021-08-14 DIAGNOSIS — R2689 Other abnormalities of gait and mobility: Secondary | ICD-10-CM | POA: Diagnosis not present

## 2021-08-14 DIAGNOSIS — M6281 Muscle weakness (generalized): Secondary | ICD-10-CM | POA: Diagnosis not present

## 2021-08-16 DIAGNOSIS — R2689 Other abnormalities of gait and mobility: Secondary | ICD-10-CM | POA: Diagnosis not present

## 2021-08-16 DIAGNOSIS — M6281 Muscle weakness (generalized): Secondary | ICD-10-CM | POA: Diagnosis not present

## 2021-08-16 DIAGNOSIS — E118 Type 2 diabetes mellitus with unspecified complications: Secondary | ICD-10-CM | POA: Diagnosis not present

## 2021-08-17 DIAGNOSIS — R2689 Other abnormalities of gait and mobility: Secondary | ICD-10-CM | POA: Diagnosis not present

## 2021-08-17 DIAGNOSIS — M6281 Muscle weakness (generalized): Secondary | ICD-10-CM | POA: Diagnosis not present

## 2021-08-17 DIAGNOSIS — E118 Type 2 diabetes mellitus with unspecified complications: Secondary | ICD-10-CM | POA: Diagnosis not present

## 2021-08-20 DIAGNOSIS — R2689 Other abnormalities of gait and mobility: Secondary | ICD-10-CM | POA: Diagnosis not present

## 2021-08-20 DIAGNOSIS — E118 Type 2 diabetes mellitus with unspecified complications: Secondary | ICD-10-CM | POA: Diagnosis not present

## 2021-08-20 DIAGNOSIS — M6281 Muscle weakness (generalized): Secondary | ICD-10-CM | POA: Diagnosis not present

## 2021-08-21 DIAGNOSIS — E118 Type 2 diabetes mellitus with unspecified complications: Secondary | ICD-10-CM | POA: Diagnosis not present

## 2021-08-21 DIAGNOSIS — M6281 Muscle weakness (generalized): Secondary | ICD-10-CM | POA: Diagnosis not present

## 2021-08-21 DIAGNOSIS — R2689 Other abnormalities of gait and mobility: Secondary | ICD-10-CM | POA: Diagnosis not present

## 2021-08-22 DIAGNOSIS — M6281 Muscle weakness (generalized): Secondary | ICD-10-CM | POA: Diagnosis not present

## 2021-08-22 DIAGNOSIS — E118 Type 2 diabetes mellitus with unspecified complications: Secondary | ICD-10-CM | POA: Diagnosis not present

## 2021-08-22 DIAGNOSIS — R2689 Other abnormalities of gait and mobility: Secondary | ICD-10-CM | POA: Diagnosis not present

## 2021-08-23 DIAGNOSIS — E785 Hyperlipidemia, unspecified: Secondary | ICD-10-CM | POA: Diagnosis not present

## 2021-08-23 DIAGNOSIS — M1711 Unilateral primary osteoarthritis, right knee: Secondary | ICD-10-CM | POA: Diagnosis not present

## 2021-08-23 DIAGNOSIS — M79604 Pain in right leg: Secondary | ICD-10-CM | POA: Diagnosis not present

## 2021-08-23 DIAGNOSIS — E119 Type 2 diabetes mellitus without complications: Secondary | ICD-10-CM | POA: Diagnosis not present

## 2021-08-23 DIAGNOSIS — E039 Hypothyroidism, unspecified: Secondary | ICD-10-CM | POA: Diagnosis not present

## 2021-08-23 DIAGNOSIS — F32A Depression, unspecified: Secondary | ICD-10-CM | POA: Diagnosis not present

## 2021-08-23 DIAGNOSIS — M6281 Muscle weakness (generalized): Secondary | ICD-10-CM | POA: Diagnosis not present

## 2021-08-23 DIAGNOSIS — E118 Type 2 diabetes mellitus with unspecified complications: Secondary | ICD-10-CM | POA: Diagnosis not present

## 2021-08-23 DIAGNOSIS — I10 Essential (primary) hypertension: Secondary | ICD-10-CM | POA: Diagnosis not present

## 2021-08-23 DIAGNOSIS — T8789 Other complications of amputation stump: Secondary | ICD-10-CM | POA: Diagnosis not present

## 2021-08-23 DIAGNOSIS — E559 Vitamin D deficiency, unspecified: Secondary | ICD-10-CM | POA: Diagnosis not present

## 2021-08-23 DIAGNOSIS — R2689 Other abnormalities of gait and mobility: Secondary | ICD-10-CM | POA: Diagnosis not present

## 2021-08-24 DIAGNOSIS — R2689 Other abnormalities of gait and mobility: Secondary | ICD-10-CM | POA: Diagnosis not present

## 2021-08-24 DIAGNOSIS — E118 Type 2 diabetes mellitus with unspecified complications: Secondary | ICD-10-CM | POA: Diagnosis not present

## 2021-08-24 DIAGNOSIS — M6281 Muscle weakness (generalized): Secondary | ICD-10-CM | POA: Diagnosis not present

## 2021-08-27 DIAGNOSIS — Z89511 Acquired absence of right leg below knee: Secondary | ICD-10-CM | POA: Diagnosis not present

## 2021-08-27 DIAGNOSIS — K219 Gastro-esophageal reflux disease without esophagitis: Secondary | ICD-10-CM | POA: Diagnosis not present

## 2021-08-27 DIAGNOSIS — Z89512 Acquired absence of left leg below knee: Secondary | ICD-10-CM | POA: Diagnosis not present

## 2021-08-27 DIAGNOSIS — E559 Vitamin D deficiency, unspecified: Secondary | ICD-10-CM | POA: Diagnosis not present

## 2021-08-27 DIAGNOSIS — R5381 Other malaise: Secondary | ICD-10-CM | POA: Diagnosis not present

## 2021-08-27 DIAGNOSIS — M79604 Pain in right leg: Secondary | ICD-10-CM | POA: Diagnosis not present

## 2021-08-27 DIAGNOSIS — E119 Type 2 diabetes mellitus without complications: Secondary | ICD-10-CM | POA: Diagnosis not present

## 2021-08-27 DIAGNOSIS — K5900 Constipation, unspecified: Secondary | ICD-10-CM | POA: Diagnosis not present

## 2021-08-28 ENCOUNTER — Ambulatory Visit (INDEPENDENT_AMBULATORY_CARE_PROVIDER_SITE_OTHER): Payer: Medicare Other | Admitting: Family

## 2021-08-28 ENCOUNTER — Other Ambulatory Visit: Payer: Self-pay

## 2021-08-28 DIAGNOSIS — Z89511 Acquired absence of right leg below knee: Secondary | ICD-10-CM

## 2021-08-28 DIAGNOSIS — R2689 Other abnormalities of gait and mobility: Secondary | ICD-10-CM | POA: Diagnosis not present

## 2021-08-28 DIAGNOSIS — Z89512 Acquired absence of left leg below knee: Secondary | ICD-10-CM | POA: Diagnosis not present

## 2021-08-28 DIAGNOSIS — E118 Type 2 diabetes mellitus with unspecified complications: Secondary | ICD-10-CM | POA: Diagnosis not present

## 2021-08-28 DIAGNOSIS — S8991XA Unspecified injury of right lower leg, initial encounter: Secondary | ICD-10-CM

## 2021-08-28 DIAGNOSIS — M6281 Muscle weakness (generalized): Secondary | ICD-10-CM | POA: Diagnosis not present

## 2021-08-28 MED ORDER — LIDOCAINE HCL 1 % IJ SOLN
5.0000 mL | INTRAMUSCULAR | Status: AC | PRN
  Administered 2021-08-28: 5 mL

## 2021-08-28 MED ORDER — METHYLPREDNISOLONE ACETATE 40 MG/ML IJ SUSP
40.0000 mg | INTRAMUSCULAR | Status: AC | PRN
  Administered 2021-08-28: 40 mg via INTRA_ARTICULAR

## 2021-08-28 NOTE — Progress Notes (Signed)
Office Visit Note   Patient: Michelle Skinner           Date of Birth: 1965/11/03           MRN: [id number] Visit Date: 08/28/2021              Requested by: Monico Blitz, MD [address] [place],  [place] [address] PCP: Monico Blitz, MD  Chief Complaint  Patient presents with   Right Leg - Pain      HPI: The patient is a 56 year old woman who is status post bilateral below-knee amputations.  She has been working with physical therapy at skilled nursing and did have a fall 2 weeks ago her right knee got hung up behind her left prosthesis and twisted.  She fell into a chair she complains of medial knee pain and instability.  She is having some swelling and cannot get her right residual limb into her socket  She did have some radiographs done with a mobile x-ray unit and the report is faxed to [place] today this was reviewed.  Assessment & Plan: Visit Diagnoses: No diagnosis found.  Plan: Suspect meniscal injury of the right knee she does also have some painful bony spurring to her distal tibia.  Have encouraged her to follow-up with [facility] to continue modifying her socket  Depo-Medrol injection of the right knee she will follow-up in the office in 3 to 4 weeks as needed we will consider MRI if she does not get any relief with the injection.  Up with therapy.  Weightbearing as tolerated.  At this time the patient would like to continue with conservative measures she is hesitant to have any further surgery  Follow-Up Instructions: No follow-ups on file.   Right Knee Exam   Tenderness  The patient is experiencing tenderness in the medial joint line.  Range of Motion  The patient has normal right knee ROM.  Tests  Varus: negative Valgus: negative  Other  Erythema: absent Swelling: mild     Patient is alert, oriented, no adenopathy, well-dressed, normal affect, normal respiratory effort.   Imaging: No results found. No images are attached to the  encounter.  Labs: Lab Results  Component Value Date   HGBA1C 7.7 (A) 07/26/2021   HGBA1C 6.5 03/29/2021   HGBA1C 6.5 03/29/2021   REPTSTATUS 12/21/2020 FINAL 12/18/2020   GRAMSTAIN  09/10/2020    RARE WBC PRESENT, PREDOMINANTLY PMN ABUNDANT GRAM POSITIVE COCCI RARE GRAM NEGATIVE RODS    CULT >=100,000 COLONIES/mL ESCHERICHIA COLI (A) 12/18/2020   LABORGA ESCHERICHIA COLI (A) 12/18/2020     Lab Results  Component Value Date   ALBUMIN 2.8 (L) 12/17/2020   ALBUMIN <1.0 (L) 09/17/2020   ALBUMIN <1.0 (L) 09/16/2020   PREALBUMIN 11.1 (L) 09/16/2020    Lab Results  Component Value Date   MG 2.2 09/17/2020   MG 1.9 09/16/2020   MG 1.9 09/15/2020   Lab Results  Component Value Date   VD25OH 12 (L) 10/20/2019   VD25OH 22 (L) 04/01/2019   VD25OH 29 (L) 08/17/2018    Lab Results  Component Value Date   PREALBUMIN 11.1 (L) 09/16/2020   CBC EXTENDED Latest Ref Rng & Units 12/17/2020 09/28/2020 09/17/2020  WBC 4.0 - 10.5 K/uL 10.1 3.2(L) 11.9(H)  RBC 3.87 - 5.11 MIL/uL 4.04 2.68(L) 2.81(L)  HGB 12.0 - 15.0 g/dL 12.0 7.9(L) 8.2(L)  HCT 36.0 - 46.0 % 36.7 25.4(L) 25.7(L)  PLT 150 - 400 K/uL 173 220 308  NEUTROABS  1.7 - 7.7 K/uL 9.0(H) 2.3 -  LYMPHSABS 0.7 - 4.0 K/uL 0.4(L) 0.6(L) -     There is no height or weight on file to calculate BMI.  Orders:  No orders of the defined types were placed in this encounter.  No orders of the defined types were placed in this encounter.    Procedures: Large Joint Inj: R knee on 08/28/2021 9:04 AM Indications: pain Details: 18 G 1.5 in needle, anteromedial approach Medications: 5 mL lidocaine 1 %; 40 mg methylPREDNISolone acetate 40 MG/ML Consent was given by the patient.     Clinical Data: No additional findings.  ROS:  All other systems negative, except as noted in the HPI. Review of Systems  Objective: Vital Signs: There were no vitals taken for this visit.  Specialty Comments:  No specialty comments  available.  PMFS History: Patient Active Problem List   Diagnosis Date Noted   Peripherally inserted central catheter (PICC) in place 10/05/2020   MSSA bacteremia    Acute hematogenous osteomyelitis of both feet ([place])    Severe protein-calorie malnutrition ([place])    BMI 50.0-59.9, adult ([place])    Plantar ulcer of right foot ([place])    Plantar ulcer of left foot ([place])    Lymphedema    Sepsis ([place]) 09/06/2020   Cellulitis 04/27/2020   AKI (acute kidney injury) ([place]) 04/27/2020   Stasis dermatitis 04/27/2020   Anxiety 03/31/2020   Depression 03/31/2020   GERD (gastroesophageal reflux disease) 03/31/2020   Posttraumatic stress disorder 03/31/2020   Personal history of noncompliance with medical treatment, presenting hazards to health 02/09/2018   Abnormal CT of the abdomen 10/20/2017   Dilated cbd, acquired 10/20/2017   Essential hypertension, benign 01/14/2017   Primary hypothyroidism 05/03/2015   Mixed hyperlipidemia 05/03/2015   DM type 2 causing vascular disease ([place]) 04/25/2015   Morbid (severe) obesity due to excess calories ([place]) 04/25/2015   Vitamin D deficiency 04/25/2015   Cervical cancer ([place]) 10/05/2012   Past Medical History:  Diagnosis Date   Amputee, above knee, left ([facility])    Amputee, above knee, right ([facility])    Anemia    Cervical cancer ([place])    Depression    Diabetes mellitus, type II ([place])    GERD (gastroesophageal reflux disease)    Hyperlipidemia    Neuropathy    PTSD (post-traumatic stress disorder)     Family History  Problem Relation Age of Onset   Hypertension Mother    Diabetes Father    Hyperlipidemia Father    CAD Father    Stroke Father    Osteoporosis Maternal Grandmother    Cancer Maternal Grandmother    Hypertension Maternal Grandmother    Colon cancer Neg Hx     Past Surgical History:  Procedure Laterality Date   ABDOMINAL HYSTERECTOMY     AMPUTATION Bilateral 09/13/2020   Procedure: BILATERAL BELOW KNEE AMPUTATIONS;  Surgeon: [name], MD;  Location: [facility];  Service: Orthopedics;  Laterality: Bilateral;   COLONOSCOPY WITH PROPOFOL N/A 05/25/2018   Procedure: COLONOSCOPY WITH PROPOFOL;  Surgeon: Daneil Dolin, MD;  [facility];  Service: Endoscopy;  Laterality: N/A;  7:30am   I & D EXTREMITY Bilateral 09/10/2020   Procedure: IRRIGATION AND DEBRIDEMENT FEET;;  Surgeon: [name], MD;  Location: [facility];  Service: Orthopedics;  Laterality: Bilateral;   IRRIGATION AND DEBRIDEMENT FOOT Bilateral 09/08/2020   Procedure: IRRIGATION AND DEBRIDEMENT FOOT;  Surgeon: Virl Cagey, MD;  Location: [facility];  Service: General;  Laterality: Bilateral;  left foot 11 x 10.5 x 1    OTHER SURGICAL HISTORY Right    R Foot- I&D   POLYPECTOMY  05/25/2018   Procedure: POLYPECTOMY;  Surgeon: Daneil Dolin, MD;  [facility];  Service: Endoscopy;;  colon   UMBILICAL HERNIA REPAIR  2007   Social History   Occupational History   Not on file  Tobacco Use   Smoking status: Never   Smokeless tobacco: Never  Vaping Use   Vaping Use: Never used  Substance and Sexual Activity   Alcohol use: No    Alcohol/week: 0.0 standard drinks   Drug use: No   Sexual activity: Yes    Birth control/protection: Surgical

## 2021-08-29 DIAGNOSIS — E118 Type 2 diabetes mellitus with unspecified complications: Secondary | ICD-10-CM | POA: Diagnosis not present

## 2021-08-29 DIAGNOSIS — E119 Type 2 diabetes mellitus without complications: Secondary | ICD-10-CM | POA: Diagnosis not present

## 2021-08-29 DIAGNOSIS — R2689 Other abnormalities of gait and mobility: Secondary | ICD-10-CM | POA: Diagnosis not present

## 2021-08-29 DIAGNOSIS — M6281 Muscle weakness (generalized): Secondary | ICD-10-CM | POA: Diagnosis not present

## 2021-08-30 DIAGNOSIS — K219 Gastro-esophageal reflux disease without esophagitis: Secondary | ICD-10-CM | POA: Diagnosis not present

## 2021-08-30 DIAGNOSIS — R5381 Other malaise: Secondary | ICD-10-CM | POA: Diagnosis not present

## 2021-08-30 DIAGNOSIS — E118 Type 2 diabetes mellitus with unspecified complications: Secondary | ICD-10-CM | POA: Diagnosis not present

## 2021-08-30 DIAGNOSIS — Z79899 Other long term (current) drug therapy: Secondary | ICD-10-CM | POA: Diagnosis not present

## 2021-08-30 DIAGNOSIS — E039 Hypothyroidism, unspecified: Secondary | ICD-10-CM | POA: Diagnosis not present

## 2021-08-30 DIAGNOSIS — I10 Essential (primary) hypertension: Secondary | ICD-10-CM | POA: Diagnosis not present

## 2021-08-30 DIAGNOSIS — R2689 Other abnormalities of gait and mobility: Secondary | ICD-10-CM | POA: Diagnosis not present

## 2021-08-30 DIAGNOSIS — E785 Hyperlipidemia, unspecified: Secondary | ICD-10-CM | POA: Diagnosis not present

## 2021-08-30 DIAGNOSIS — E119 Type 2 diabetes mellitus without complications: Secondary | ICD-10-CM | POA: Diagnosis not present

## 2021-08-30 DIAGNOSIS — F32A Depression, unspecified: Secondary | ICD-10-CM | POA: Diagnosis not present

## 2021-08-30 DIAGNOSIS — M6281 Muscle weakness (generalized): Secondary | ICD-10-CM | POA: Diagnosis not present

## 2021-08-31 DIAGNOSIS — E113593 Type 2 diabetes mellitus with proliferative diabetic retinopathy without macular edema, bilateral: Secondary | ICD-10-CM | POA: Diagnosis not present

## 2021-09-02 DIAGNOSIS — E1165 Type 2 diabetes mellitus with hyperglycemia: Secondary | ICD-10-CM | POA: Diagnosis not present

## 2021-09-03 ENCOUNTER — Other Ambulatory Visit: Payer: Self-pay | Admitting: Family

## 2021-09-03 ENCOUNTER — Other Ambulatory Visit (HOSPITAL_COMMUNITY): Payer: Self-pay | Admitting: Family

## 2021-09-03 DIAGNOSIS — E119 Type 2 diabetes mellitus without complications: Secondary | ICD-10-CM | POA: Diagnosis not present

## 2021-09-03 DIAGNOSIS — E559 Vitamin D deficiency, unspecified: Secondary | ICD-10-CM | POA: Diagnosis not present

## 2021-09-03 DIAGNOSIS — M6281 Muscle weakness (generalized): Secondary | ICD-10-CM | POA: Diagnosis not present

## 2021-09-03 DIAGNOSIS — E118 Type 2 diabetes mellitus with unspecified complications: Secondary | ICD-10-CM | POA: Diagnosis not present

## 2021-09-03 DIAGNOSIS — R2689 Other abnormalities of gait and mobility: Secondary | ICD-10-CM | POA: Diagnosis not present

## 2021-09-03 DIAGNOSIS — Z89512 Acquired absence of left leg below knee: Secondary | ICD-10-CM | POA: Diagnosis not present

## 2021-09-03 DIAGNOSIS — M869 Osteomyelitis, unspecified: Secondary | ICD-10-CM

## 2021-09-03 DIAGNOSIS — I10 Essential (primary) hypertension: Secondary | ICD-10-CM | POA: Diagnosis not present

## 2021-09-03 DIAGNOSIS — E039 Hypothyroidism, unspecified: Secondary | ICD-10-CM | POA: Diagnosis not present

## 2021-09-03 DIAGNOSIS — E785 Hyperlipidemia, unspecified: Secondary | ICD-10-CM | POA: Diagnosis not present

## 2021-09-03 DIAGNOSIS — F32A Depression, unspecified: Secondary | ICD-10-CM | POA: Diagnosis not present

## 2021-09-03 DIAGNOSIS — Z89511 Acquired absence of right leg below knee: Secondary | ICD-10-CM | POA: Diagnosis not present

## 2021-09-04 DIAGNOSIS — E118 Type 2 diabetes mellitus with unspecified complications: Secondary | ICD-10-CM | POA: Diagnosis not present

## 2021-09-04 DIAGNOSIS — M6281 Muscle weakness (generalized): Secondary | ICD-10-CM | POA: Diagnosis not present

## 2021-09-04 DIAGNOSIS — R2689 Other abnormalities of gait and mobility: Secondary | ICD-10-CM | POA: Diagnosis not present

## 2021-09-05 DIAGNOSIS — R2689 Other abnormalities of gait and mobility: Secondary | ICD-10-CM | POA: Diagnosis not present

## 2021-09-05 DIAGNOSIS — M6281 Muscle weakness (generalized): Secondary | ICD-10-CM | POA: Diagnosis not present

## 2021-09-05 DIAGNOSIS — E118 Type 2 diabetes mellitus with unspecified complications: Secondary | ICD-10-CM | POA: Diagnosis not present

## 2021-09-06 DIAGNOSIS — R2689 Other abnormalities of gait and mobility: Secondary | ICD-10-CM | POA: Diagnosis not present

## 2021-09-06 DIAGNOSIS — M6281 Muscle weakness (generalized): Secondary | ICD-10-CM | POA: Diagnosis not present

## 2021-09-06 DIAGNOSIS — H2513 Age-related nuclear cataract, bilateral: Secondary | ICD-10-CM | POA: Diagnosis not present

## 2021-09-06 DIAGNOSIS — E118 Type 2 diabetes mellitus with unspecified complications: Secondary | ICD-10-CM | POA: Diagnosis not present

## 2021-09-07 DIAGNOSIS — E118 Type 2 diabetes mellitus with unspecified complications: Secondary | ICD-10-CM | POA: Diagnosis not present

## 2021-09-07 DIAGNOSIS — R2689 Other abnormalities of gait and mobility: Secondary | ICD-10-CM | POA: Diagnosis not present

## 2021-09-07 DIAGNOSIS — M6281 Muscle weakness (generalized): Secondary | ICD-10-CM | POA: Diagnosis not present

## 2021-09-10 DIAGNOSIS — F32A Depression, unspecified: Secondary | ICD-10-CM | POA: Diagnosis not present

## 2021-09-10 DIAGNOSIS — Z79899 Other long term (current) drug therapy: Secondary | ICD-10-CM | POA: Diagnosis not present

## 2021-09-10 DIAGNOSIS — R5381 Other malaise: Secondary | ICD-10-CM | POA: Diagnosis not present

## 2021-09-10 DIAGNOSIS — I10 Essential (primary) hypertension: Secondary | ICD-10-CM | POA: Diagnosis not present

## 2021-09-10 DIAGNOSIS — R051 Acute cough: Secondary | ICD-10-CM | POA: Diagnosis not present

## 2021-09-10 DIAGNOSIS — E785 Hyperlipidemia, unspecified: Secondary | ICD-10-CM | POA: Diagnosis not present

## 2021-09-10 DIAGNOSIS — K5900 Constipation, unspecified: Secondary | ICD-10-CM | POA: Diagnosis not present

## 2021-09-10 DIAGNOSIS — E559 Vitamin D deficiency, unspecified: Secondary | ICD-10-CM | POA: Diagnosis not present

## 2021-09-10 DIAGNOSIS — E119 Type 2 diabetes mellitus without complications: Secondary | ICD-10-CM | POA: Diagnosis not present

## 2021-09-11 DIAGNOSIS — D649 Anemia, unspecified: Secondary | ICD-10-CM | POA: Diagnosis not present

## 2021-09-11 DIAGNOSIS — E118 Type 2 diabetes mellitus with unspecified complications: Secondary | ICD-10-CM | POA: Diagnosis not present

## 2021-09-11 DIAGNOSIS — M6281 Muscle weakness (generalized): Secondary | ICD-10-CM | POA: Diagnosis not present

## 2021-09-11 DIAGNOSIS — R2689 Other abnormalities of gait and mobility: Secondary | ICD-10-CM | POA: Diagnosis not present

## 2021-09-13 ENCOUNTER — Other Ambulatory Visit: Payer: Self-pay

## 2021-09-13 ENCOUNTER — Ambulatory Visit (HOSPITAL_COMMUNITY)
Admission: RE | Admit: 2021-09-13 | Discharge: 2021-09-13 | Disposition: A | Discharge status: Home / Self Care | Payer: Medicare Other | Source: Ambulatory Visit | Attending: Family | Admitting: Family

## 2021-09-13 DIAGNOSIS — M7989 Other specified soft tissue disorders: Secondary | ICD-10-CM | POA: Diagnosis not present

## 2021-09-13 DIAGNOSIS — R600 Localized edema: Secondary | ICD-10-CM | POA: Diagnosis not present

## 2021-09-13 DIAGNOSIS — M869 Osteomyelitis, unspecified: Secondary | ICD-10-CM | POA: Diagnosis not present

## 2021-09-13 DIAGNOSIS — R2689 Other abnormalities of gait and mobility: Secondary | ICD-10-CM | POA: Diagnosis not present

## 2021-09-13 DIAGNOSIS — M6281 Muscle weakness (generalized): Secondary | ICD-10-CM | POA: Diagnosis not present

## 2021-09-13 DIAGNOSIS — M25461 Effusion, right knee: Secondary | ICD-10-CM | POA: Diagnosis not present

## 2021-09-13 DIAGNOSIS — E118 Type 2 diabetes mellitus with unspecified complications: Secondary | ICD-10-CM | POA: Diagnosis not present

## 2021-09-13 MED ORDER — GADOBUTROL 1 MMOL/ML IV SOLN
10.0000 mL | Freq: Once | INTRAVENOUS | Status: AC | PRN
  Administered 2021-09-13: 18:00:00 10 mL via INTRAVENOUS

## 2021-09-14 DIAGNOSIS — R2689 Other abnormalities of gait and mobility: Secondary | ICD-10-CM | POA: Diagnosis not present

## 2021-09-14 DIAGNOSIS — E118 Type 2 diabetes mellitus with unspecified complications: Secondary | ICD-10-CM | POA: Diagnosis not present

## 2021-09-14 DIAGNOSIS — M6281 Muscle weakness (generalized): Secondary | ICD-10-CM | POA: Diagnosis not present

## 2021-09-17 DIAGNOSIS — M6281 Muscle weakness (generalized): Secondary | ICD-10-CM | POA: Diagnosis not present

## 2021-09-17 DIAGNOSIS — E118 Type 2 diabetes mellitus with unspecified complications: Secondary | ICD-10-CM | POA: Diagnosis not present

## 2021-09-17 DIAGNOSIS — R2689 Other abnormalities of gait and mobility: Secondary | ICD-10-CM | POA: Diagnosis not present

## 2021-09-18 ENCOUNTER — Encounter: Payer: Self-pay | Admitting: Family

## 2021-09-18 ENCOUNTER — Ambulatory Visit (INDEPENDENT_AMBULATORY_CARE_PROVIDER_SITE_OTHER): Payer: Medicare Other | Admitting: Family

## 2021-09-18 DIAGNOSIS — R2689 Other abnormalities of gait and mobility: Secondary | ICD-10-CM | POA: Diagnosis not present

## 2021-09-18 DIAGNOSIS — E118 Type 2 diabetes mellitus with unspecified complications: Secondary | ICD-10-CM | POA: Diagnosis not present

## 2021-09-18 DIAGNOSIS — M25561 Pain in right knee: Secondary | ICD-10-CM

## 2021-09-18 DIAGNOSIS — Z89511 Acquired absence of right leg below knee: Secondary | ICD-10-CM

## 2021-09-18 DIAGNOSIS — M6281 Muscle weakness (generalized): Secondary | ICD-10-CM | POA: Diagnosis not present

## 2021-09-18 NOTE — Progress Notes (Signed)
? ?Office Visit Note ?  ?Patient: Michelle Skinner           ?Date of Birth: [birth year]           ?MRN: [id number] ?Visit Date: 09/18/2021 ?             ?Requested by: Monico Blitz, MD ?[address] ?[place],  [place] [address] ?PCP: Monico Blitz, MD ? ?Chief Complaint  ?Patient presents with  ? Right Knee - Follow-up  ? ? ? ? ?HPI: ?The patient is a 56 year old woman who presents today in follow-up she has been having issues with her right knee.  She does also have a history of bilateral below-knee amputations.  She was getting up with therapy and noticed immediate onset of pain to her medial knee she has had a Depo-Medrol injection which provided her with modest relief.  She reports that physical therapy has not been getting her up for much ambulation they fear her falling.  She also fears her knee locking and giving way she feels the sensation when she stands and bears weight.  Today she is also pointing to the middle of her knee over her patellar tendon as a tender area this is consistent with MRI findings she did have a recent MRI of her right knee.  The MRI did not show any meniscal injury however they noted a small joint effusion as well as prepatellar soft tissue swelling without fluid or abscess. ? ?She appears to be getting quite a lot of pressure over the prepatellar area from her socket with weightbearing she has had modifications to it in the past ? ?Assessment & Plan: ?Visit Diagnoses: No diagnosis found. ? ?Plan: Dr. Sharol Given to the bedside.  Evaluation.  Discussed MRI findings.  We will proceed with repeat Depo-Medrol injection of the right knee she may be up with physical therapy weightbearing as safe and tolerated.  We will follow-up with her again in 4 weeks ? ?Follow-Up Instructions: Return in about 27 days (around 10/15/2021), or c duda.  ? ?Right Knee Exam  ? ?Muscle Strength  ?The patient has normal right knee strength. ? ?Tenderness  ?The patient is experiencing tenderness in the medial joint line and  patellar tendon. ? ?Range of Motion  ?The patient has normal right knee ROM. ? ?Tests  ?Varus: negative Valgus: negative ? ?Other  ?Erythema: absent ?Swelling: moderate ? ? ? ? ?Patient is alert, oriented, no adenopathy, well-dressed, normal affect, normal respiratory effort. ? ? ?Imaging: ?No results found. ?No images are attached to the encounter. ? ?Labs: ?Lab Results  ?Component Value Date  ? HGBA1C 7.7 (A) 07/26/2021  ? HGBA1C 6.5 03/29/2021  ? HGBA1C 6.5 03/29/2021  ? REPTSTATUS 12/21/2020 FINAL 12/18/2020  ? GRAMSTAIN  09/10/2020  ?  RARE WBC PRESENT, PREDOMINANTLY PMN ?ABUNDANT GRAM POSITIVE COCCI ?RARE GRAM NEGATIVE RODS ?  ? CULT >=100,000 COLONIES/mL ESCHERICHIA COLI (A) 12/18/2020  ? LABORGA ESCHERICHIA COLI (A) 12/18/2020  ? ? ? ?Lab Results  ?Component Value Date  ? ALBUMIN 2.8 (L) 12/17/2020  ? ALBUMIN <1.0 (L) 09/17/2020  ? ALBUMIN <1.0 (L) 09/16/2020  ? PREALBUMIN 11.1 (L) 09/16/2020  ? ? ?Lab Results  ?Component Value Date  ? MG 2.2 09/17/2020  ? MG 1.9 09/16/2020  ? MG 1.9 09/15/2020  ? ?Lab Results  ?Component Value Date  ? VD25OH 12 (L) 10/20/2019  ? VD25OH 22 (L) 04/01/2019  ? VD25OH 29 (L) 08/17/2018  ? ? ?Lab Results  ?Component Value Date  ? PREALBUMIN  11.1 (L) 09/16/2020  ? ?CBC EXTENDED Latest Ref Rng & Units 12/17/2020 09/28/2020 09/17/2020  ?WBC 4.0 - 10.5 K/uL 10.1 3.2(L) 11.9(H)  ?RBC 3.87 - 5.11 MIL/uL 4.04 2.68(L) 2.81(L)  ?HGB 12.0 - 15.0 g/dL 12.0 7.9(L) 8.2(L)  ?HCT 36.0 - 46.0 % 36.7 25.4(L) 25.7(L)  ?PLT 150 - 400 K/uL 173 220 308  ?NEUTROABS 1.7 - 7.7 K/uL 9.0(H) 2.3 -  ?LYMPHSABS 0.7 - 4.0 K/uL 0.4(L) 0.6(L) -  ? ? ? ?There is no height or weight on file to calculate BMI. ? ?Orders:  ?No orders of the defined types were placed in this encounter. ? ?No orders of the defined types were placed in this encounter. ? ? ? Procedures: ?Large Joint Inj: R knee on 09/19/2021 10:37 AM ?Indications: pain ?Details: 18 G 1.5 in needle, anteromedial approach ?Medications: 5 mL lidocaine 1 %; 40  mg methylPREDNISolone acetate 40 MG/ML ?Consent was given by the patient.  ? ? ? ?Clinical Data: ?No additional findings. ? ?ROS: ? ?All other systems negative, except as noted in the HPI. ?Review of Systems ? ?Objective: ?Vital Signs: There were no vitals taken for this visit. ? ?Specialty Comments:  ?No specialty comments available. ? ?PMFS History: ?Patient Active Problem List  ? Diagnosis Date Noted  ? Peripherally inserted central catheter (PICC) in place 10/05/2020  ? MSSA bacteremia   ? Acute hematogenous osteomyelitis of both feet ([place])   ? Severe protein-calorie malnutrition ([place])   ? BMI 50.0-59.9, adult ([place])   ? Plantar ulcer of right foot ([place])   ? Plantar ulcer of left foot ([place])   ? Lymphedema   ? Sepsis ([place]) 09/06/2020  ? Cellulitis 04/27/2020  ? AKI (acute kidney injury) ([place]) 04/27/2020  ? Stasis dermatitis 04/27/2020  ? Anxiety 03/31/2020  ? Depression 03/31/2020  ? GERD (gastroesophageal reflux disease) 03/31/2020  ? Posttraumatic stress disorder 03/31/2020  ? Personal history of noncompliance with medical treatment, presenting hazards to health 02/09/2018  ? Abnormal CT of the abdomen 10/20/2017  ? Dilated cbd, acquired 10/20/2017  ? Essential hypertension, benign 01/14/2017  ? Primary hypothyroidism 05/03/2015  ? Mixed hyperlipidemia 05/03/2015  ? 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 Morbid (severe) obesity due to excess calories ([place]) 04/25/2015  ? Vitamin D deficiency 04/25/2015  ? Cervical cancer ([place]) 10/05/2012  ? ?Past Medical History:  ?Diagnosis Date  ? Amputee, above knee, left ([place])   ? Amputee, above knee, right ([place])   ? Anemia   ? Cervical cancer ([place])   ? Depression   ? Diabetes mellitus, type II ([place])   ? GERD (gastroesophageal reflux disease)   ? Hyperlipidemia   ? Neuropathy   ? PTSD (post-traumatic stress disorder)   ?  ?Family History  ?Problem Relation Age of Onset  ? Hypertension Mother   ? Diabetes Father   ? Hyperlipidemia Father   ? CAD Father   ?  Stroke Father   ? Osteoporosis Maternal Grandmother   ? Cancer Maternal Grandmother   ? Hypertension Maternal Grandmother   ? Colon cancer Neg Hx   ?  ?Past Surgical History:  ?Procedure Laterality Date  ? ABDOMINAL HYSTERECTOMY    ? AMPUTATION Bilateral 09/13/2020  ? Procedure: BILATERAL BELOW KNEE AMPUTATIONS;  Surgeon: [name], MD;  Location: [facility];  Service: Orthopedics;  Laterality: Bilateral;  ? COLONOSCOPY WITH PROPOFOL N/A 05/25/2018  ? Procedure: COLONOSCOPY WITH PROPOFOL;  Surgeon: Daneil Dolin, MD;  [facility];  Service: Endoscopy;  Laterality: N/A;  7:30am  ? I & D EXTREMITY Bilateral 09/10/2020  ? Procedure: IRRIGATION AND DEBRIDEMENT FEET;;  Surgeon: [name], MD;  Location: [facility];  Service: Orthopedics;  Laterality: Bilateral;  ? IRRIGATION AND DEBRIDEMENT FOOT Bilateral 09/08/2020  ? Procedure: IRRIGATION AND DEBRIDEMENT FOOT;  Surgeon: Virl Cagey, MD;  [facility];  Service: General;  Laterality: Bilateral;  left foot 11 x 10.5 x 1 ?  ? OTHER SURGICAL HISTORY Right   ? R Foot- I&D  ? POLYPECTOMY  05/25/2018  ? Procedure: POLYPECTOMY;  Surgeon: Daneil Dolin, MD;  [facility];  Service: Endoscopy;;  colon  ? UMBILICAL HERNIA REPAIR  2007  ? ?Social History  ? ?Occupational History  ? Not on file  ?Tobacco Use  ? Smoking status: Never  ? Smokeless tobacco: Never  ?Vaping Use  ? Vaping Use: Never used  ?Substance and Sexual Activity  ? Alcohol use: No  ?  Alcohol/week: 0.0 standard drinks  ? Drug use: No  ? Sexual activity: Yes  ?  Birth control/protection: Surgical  ? ? ? ? ? ?

## 2021-09-19 DIAGNOSIS — M25561 Pain in right knee: Secondary | ICD-10-CM | POA: Diagnosis not present

## 2021-09-19 DIAGNOSIS — E118 Type 2 diabetes mellitus with unspecified complications: Secondary | ICD-10-CM | POA: Diagnosis not present

## 2021-09-19 DIAGNOSIS — M6281 Muscle weakness (generalized): Secondary | ICD-10-CM | POA: Diagnosis not present

## 2021-09-19 DIAGNOSIS — R2689 Other abnormalities of gait and mobility: Secondary | ICD-10-CM | POA: Diagnosis not present

## 2021-09-19 MED ORDER — LIDOCAINE HCL 1 % IJ SOLN
5.0000 mL | INTRAMUSCULAR | Status: AC | PRN
  Administered 2021-09-19: 5 mL

## 2021-09-19 MED ORDER — METHYLPREDNISOLONE ACETATE 40 MG/ML IJ SUSP
40.0000 mg | INTRAMUSCULAR | Status: AC | PRN
  Administered 2021-09-19: 40 mg via INTRA_ARTICULAR

## 2021-09-20 ENCOUNTER — Telehealth: Payer: Self-pay | Admitting: "Endocrinology

## 2021-09-20 DIAGNOSIS — R2689 Other abnormalities of gait and mobility: Secondary | ICD-10-CM | POA: Diagnosis not present

## 2021-09-20 DIAGNOSIS — M6281 Muscle weakness (generalized): Secondary | ICD-10-CM | POA: Diagnosis not present

## 2021-09-20 DIAGNOSIS — E118 Type 2 diabetes mellitus with unspecified complications: Secondary | ICD-10-CM | POA: Diagnosis not present

## 2021-09-20 DIAGNOSIS — E1159 Type 2 diabetes mellitus with other circulatory complications: Secondary | ICD-10-CM

## 2021-09-20 MED ORDER — FREESTYLE LIBRE 2 READER DEVI: 0 refills | Status: DC

## 2021-09-20 NOTE — Telephone Encounter (Signed)
Rx for [place] 2 reader sent in to [facility] as requested. ?
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
## 2021-09-21 DIAGNOSIS — E118 Type 2 diabetes mellitus with unspecified complications: Secondary | ICD-10-CM | POA: Diagnosis not present

## 2021-09-21 DIAGNOSIS — R2689 Other abnormalities of gait and mobility: Secondary | ICD-10-CM | POA: Diagnosis not present

## 2021-09-21 DIAGNOSIS — M6281 Muscle weakness (generalized): Secondary | ICD-10-CM | POA: Diagnosis not present

## 2021-09-25 DIAGNOSIS — M6281 Muscle weakness (generalized): Secondary | ICD-10-CM | POA: Diagnosis not present

## 2021-09-25 DIAGNOSIS — E118 Type 2 diabetes mellitus with unspecified complications: Secondary | ICD-10-CM | POA: Diagnosis not present

## 2021-09-25 DIAGNOSIS — R2689 Other abnormalities of gait and mobility: Secondary | ICD-10-CM | POA: Diagnosis not present

## 2021-09-26 DIAGNOSIS — E118 Type 2 diabetes mellitus with unspecified complications: Secondary | ICD-10-CM | POA: Diagnosis not present

## 2021-09-26 DIAGNOSIS — M6281 Muscle weakness (generalized): Secondary | ICD-10-CM | POA: Diagnosis not present

## 2021-09-26 DIAGNOSIS — R2689 Other abnormalities of gait and mobility: Secondary | ICD-10-CM | POA: Diagnosis not present

## 2021-09-27 DIAGNOSIS — M6281 Muscle weakness (generalized): Secondary | ICD-10-CM | POA: Diagnosis not present

## 2021-09-27 DIAGNOSIS — E118 Type 2 diabetes mellitus with unspecified complications: Secondary | ICD-10-CM | POA: Diagnosis not present

## 2021-09-27 DIAGNOSIS — R2689 Other abnormalities of gait and mobility: Secondary | ICD-10-CM | POA: Diagnosis not present

## 2021-09-28 DIAGNOSIS — E113513 Type 2 diabetes mellitus with proliferative diabetic retinopathy with macular edema, bilateral: Secondary | ICD-10-CM | POA: Diagnosis not present

## 2021-09-30 DIAGNOSIS — E1165 Type 2 diabetes mellitus with hyperglycemia: Secondary | ICD-10-CM | POA: Diagnosis not present

## 2021-10-01 ENCOUNTER — Telehealth: Payer: Self-pay

## 2021-10-01 DIAGNOSIS — E118 Type 2 diabetes mellitus with unspecified complications: Secondary | ICD-10-CM | POA: Diagnosis not present

## 2021-10-01 DIAGNOSIS — M6281 Muscle weakness (generalized): Secondary | ICD-10-CM | POA: Diagnosis not present

## 2021-10-01 DIAGNOSIS — R2689 Other abnormalities of gait and mobility: Secondary | ICD-10-CM | POA: Diagnosis not present

## 2021-10-01 DIAGNOSIS — E119 Type 2 diabetes mellitus without complications: Secondary | ICD-10-CM | POA: Diagnosis not present

## 2021-10-01 NOTE — Telephone Encounter (Signed)
Called and sw Cathy per Dr. Sharol Given no reason from an orthopedic stand point that the pt would have been placed on Lovenox and no need to continue. Check with PCP or treating facility doctor to see if she would need to continue.The pt was s/p bilateral BKA last year and would not have given order for this per Dr. Sharol Given. Will call with questions.  ?

## 2021-10-01 NOTE — Telephone Encounter (Signed)
Tye Maryland from [facility] called triage. She said according to her records patient has been on Lovenox since march of last year. She wanted to know why and if we could give any thoughts on how long she will be on this medication. ?Call back [phone] ? ?Thanks ?

## 2021-10-02 DIAGNOSIS — E118 Type 2 diabetes mellitus with unspecified complications: Secondary | ICD-10-CM | POA: Diagnosis not present

## 2021-10-02 DIAGNOSIS — R2689 Other abnormalities of gait and mobility: Secondary | ICD-10-CM | POA: Diagnosis not present

## 2021-10-02 DIAGNOSIS — M6281 Muscle weakness (generalized): Secondary | ICD-10-CM | POA: Diagnosis not present

## 2021-10-03 DIAGNOSIS — R2689 Other abnormalities of gait and mobility: Secondary | ICD-10-CM | POA: Diagnosis not present

## 2021-10-03 DIAGNOSIS — M6281 Muscle weakness (generalized): Secondary | ICD-10-CM | POA: Diagnosis not present

## 2021-10-03 DIAGNOSIS — E118 Type 2 diabetes mellitus with unspecified complications: Secondary | ICD-10-CM | POA: Diagnosis not present

## 2021-10-05 DIAGNOSIS — R5381 Other malaise: Secondary | ICD-10-CM | POA: Diagnosis not present

## 2021-10-05 DIAGNOSIS — R52 Pain, unspecified: Secondary | ICD-10-CM | POA: Diagnosis not present

## 2021-10-05 DIAGNOSIS — E785 Hyperlipidemia, unspecified: Secondary | ICD-10-CM | POA: Diagnosis not present

## 2021-10-05 DIAGNOSIS — F32A Depression, unspecified: Secondary | ICD-10-CM | POA: Diagnosis not present

## 2021-10-05 DIAGNOSIS — Z79899 Other long term (current) drug therapy: Secondary | ICD-10-CM | POA: Diagnosis not present

## 2021-10-05 DIAGNOSIS — I10 Essential (primary) hypertension: Secondary | ICD-10-CM | POA: Diagnosis not present

## 2021-10-05 DIAGNOSIS — K5900 Constipation, unspecified: Secondary | ICD-10-CM | POA: Diagnosis not present

## 2021-10-05 DIAGNOSIS — E559 Vitamin D deficiency, unspecified: Secondary | ICD-10-CM | POA: Diagnosis not present

## 2021-10-06 DIAGNOSIS — E118 Type 2 diabetes mellitus with unspecified complications: Secondary | ICD-10-CM | POA: Diagnosis not present

## 2021-10-06 DIAGNOSIS — M6281 Muscle weakness (generalized): Secondary | ICD-10-CM | POA: Diagnosis not present

## 2021-10-06 DIAGNOSIS — R2689 Other abnormalities of gait and mobility: Secondary | ICD-10-CM | POA: Diagnosis not present

## 2021-10-08 DIAGNOSIS — M6281 Muscle weakness (generalized): Secondary | ICD-10-CM | POA: Diagnosis not present

## 2021-10-08 DIAGNOSIS — R2689 Other abnormalities of gait and mobility: Secondary | ICD-10-CM | POA: Diagnosis not present

## 2021-10-08 DIAGNOSIS — E118 Type 2 diabetes mellitus with unspecified complications: Secondary | ICD-10-CM | POA: Diagnosis not present

## 2021-10-09 DIAGNOSIS — M6281 Muscle weakness (generalized): Secondary | ICD-10-CM | POA: Diagnosis not present

## 2021-10-09 DIAGNOSIS — R2689 Other abnormalities of gait and mobility: Secondary | ICD-10-CM | POA: Diagnosis not present

## 2021-10-09 DIAGNOSIS — E118 Type 2 diabetes mellitus with unspecified complications: Secondary | ICD-10-CM | POA: Diagnosis not present

## 2021-10-10 DIAGNOSIS — M6281 Muscle weakness (generalized): Secondary | ICD-10-CM | POA: Diagnosis not present

## 2021-10-10 DIAGNOSIS — E118 Type 2 diabetes mellitus with unspecified complications: Secondary | ICD-10-CM | POA: Diagnosis not present

## 2021-10-10 DIAGNOSIS — R2689 Other abnormalities of gait and mobility: Secondary | ICD-10-CM | POA: Diagnosis not present

## 2021-10-11 DIAGNOSIS — E118 Type 2 diabetes mellitus with unspecified complications: Secondary | ICD-10-CM | POA: Diagnosis not present

## 2021-10-11 DIAGNOSIS — M6281 Muscle weakness (generalized): Secondary | ICD-10-CM | POA: Diagnosis not present

## 2021-10-11 DIAGNOSIS — R2689 Other abnormalities of gait and mobility: Secondary | ICD-10-CM | POA: Diagnosis not present

## 2021-10-12 DIAGNOSIS — M6281 Muscle weakness (generalized): Secondary | ICD-10-CM | POA: Diagnosis not present

## 2021-10-12 DIAGNOSIS — E118 Type 2 diabetes mellitus with unspecified complications: Secondary | ICD-10-CM | POA: Diagnosis not present

## 2021-10-12 DIAGNOSIS — R2689 Other abnormalities of gait and mobility: Secondary | ICD-10-CM | POA: Diagnosis not present

## 2021-10-16 ENCOUNTER — Ambulatory Visit (INDEPENDENT_AMBULATORY_CARE_PROVIDER_SITE_OTHER): Payer: Medicare Other | Admitting: Orthopedic Surgery

## 2021-10-16 DIAGNOSIS — Z89511 Acquired absence of right leg below knee: Secondary | ICD-10-CM

## 2021-10-16 DIAGNOSIS — Z89512 Acquired absence of left leg below knee: Secondary | ICD-10-CM

## 2021-10-17 DIAGNOSIS — R2689 Other abnormalities of gait and mobility: Secondary | ICD-10-CM | POA: Diagnosis not present

## 2021-10-17 DIAGNOSIS — E118 Type 2 diabetes mellitus with unspecified complications: Secondary | ICD-10-CM | POA: Diagnosis not present

## 2021-10-17 DIAGNOSIS — M6281 Muscle weakness (generalized): Secondary | ICD-10-CM | POA: Diagnosis not present

## 2021-10-18 DIAGNOSIS — M6281 Muscle weakness (generalized): Secondary | ICD-10-CM | POA: Diagnosis not present

## 2021-10-18 DIAGNOSIS — R2689 Other abnormalities of gait and mobility: Secondary | ICD-10-CM | POA: Diagnosis not present

## 2021-10-18 DIAGNOSIS — E118 Type 2 diabetes mellitus with unspecified complications: Secondary | ICD-10-CM | POA: Diagnosis not present

## 2021-10-19 DIAGNOSIS — E118 Type 2 diabetes mellitus with unspecified complications: Secondary | ICD-10-CM | POA: Diagnosis not present

## 2021-10-19 DIAGNOSIS — M6281 Muscle weakness (generalized): Secondary | ICD-10-CM | POA: Diagnosis not present

## 2021-10-19 DIAGNOSIS — R2689 Other abnormalities of gait and mobility: Secondary | ICD-10-CM | POA: Diagnosis not present

## 2021-10-22 DIAGNOSIS — E118 Type 2 diabetes mellitus with unspecified complications: Secondary | ICD-10-CM | POA: Diagnosis not present

## 2021-10-22 DIAGNOSIS — R2689 Other abnormalities of gait and mobility: Secondary | ICD-10-CM | POA: Diagnosis not present

## 2021-10-22 DIAGNOSIS — M6281 Muscle weakness (generalized): Secondary | ICD-10-CM | POA: Diagnosis not present

## 2021-10-22 DIAGNOSIS — I10 Essential (primary) hypertension: Secondary | ICD-10-CM | POA: Diagnosis not present

## 2021-10-23 DIAGNOSIS — E118 Type 2 diabetes mellitus with unspecified complications: Secondary | ICD-10-CM | POA: Diagnosis not present

## 2021-10-23 DIAGNOSIS — M6281 Muscle weakness (generalized): Secondary | ICD-10-CM | POA: Diagnosis not present

## 2021-10-23 DIAGNOSIS — R2689 Other abnormalities of gait and mobility: Secondary | ICD-10-CM | POA: Diagnosis not present

## 2021-10-24 DIAGNOSIS — Z79899 Other long term (current) drug therapy: Secondary | ICD-10-CM | POA: Diagnosis not present

## 2021-10-24 DIAGNOSIS — I10 Essential (primary) hypertension: Secondary | ICD-10-CM | POA: Diagnosis not present

## 2021-10-24 DIAGNOSIS — K219 Gastro-esophageal reflux disease without esophagitis: Secondary | ICD-10-CM | POA: Diagnosis not present

## 2021-10-24 DIAGNOSIS — E785 Hyperlipidemia, unspecified: Secondary | ICD-10-CM | POA: Diagnosis not present

## 2021-10-24 DIAGNOSIS — F32A Depression, unspecified: Secondary | ICD-10-CM | POA: Diagnosis not present

## 2021-10-24 DIAGNOSIS — G8929 Other chronic pain: Secondary | ICD-10-CM | POA: Diagnosis not present

## 2021-10-24 DIAGNOSIS — R5381 Other malaise: Secondary | ICD-10-CM | POA: Diagnosis not present

## 2021-10-24 DIAGNOSIS — R52 Pain, unspecified: Secondary | ICD-10-CM | POA: Diagnosis not present

## 2021-10-25 DIAGNOSIS — R2689 Other abnormalities of gait and mobility: Secondary | ICD-10-CM | POA: Diagnosis not present

## 2021-10-25 DIAGNOSIS — E785 Hyperlipidemia, unspecified: Secondary | ICD-10-CM | POA: Diagnosis not present

## 2021-10-25 DIAGNOSIS — K219 Gastro-esophageal reflux disease without esophagitis: Secondary | ICD-10-CM | POA: Diagnosis not present

## 2021-10-25 DIAGNOSIS — M6281 Muscle weakness (generalized): Secondary | ICD-10-CM | POA: Diagnosis not present

## 2021-10-25 DIAGNOSIS — E039 Hypothyroidism, unspecified: Secondary | ICD-10-CM | POA: Diagnosis not present

## 2021-10-25 DIAGNOSIS — G8929 Other chronic pain: Secondary | ICD-10-CM | POA: Diagnosis not present

## 2021-10-25 DIAGNOSIS — E118 Type 2 diabetes mellitus with unspecified complications: Secondary | ICD-10-CM | POA: Diagnosis not present

## 2021-10-25 DIAGNOSIS — E119 Type 2 diabetes mellitus without complications: Secondary | ICD-10-CM | POA: Diagnosis not present

## 2021-10-25 DIAGNOSIS — I10 Essential (primary) hypertension: Secondary | ICD-10-CM | POA: Diagnosis not present

## 2021-10-26 DIAGNOSIS — E113513 Type 2 diabetes mellitus with proliferative diabetic retinopathy with macular edema, bilateral: Secondary | ICD-10-CM | POA: Diagnosis not present

## 2021-10-29 DIAGNOSIS — R2689 Other abnormalities of gait and mobility: Secondary | ICD-10-CM | POA: Diagnosis not present

## 2021-10-29 DIAGNOSIS — M6281 Muscle weakness (generalized): Secondary | ICD-10-CM | POA: Diagnosis not present

## 2021-10-29 DIAGNOSIS — E118 Type 2 diabetes mellitus with unspecified complications: Secondary | ICD-10-CM | POA: Diagnosis not present

## 2021-10-29 DIAGNOSIS — I10 Essential (primary) hypertension: Secondary | ICD-10-CM | POA: Diagnosis not present

## 2021-10-30 DIAGNOSIS — R2689 Other abnormalities of gait and mobility: Secondary | ICD-10-CM | POA: Diagnosis not present

## 2021-10-30 DIAGNOSIS — M6281 Muscle weakness (generalized): Secondary | ICD-10-CM | POA: Diagnosis not present

## 2021-10-30 DIAGNOSIS — E118 Type 2 diabetes mellitus with unspecified complications: Secondary | ICD-10-CM | POA: Diagnosis not present

## 2021-10-31 ENCOUNTER — Encounter: Payer: Self-pay | Admitting: Orthopedic Surgery

## 2021-10-31 DIAGNOSIS — R2689 Other abnormalities of gait and mobility: Secondary | ICD-10-CM | POA: Diagnosis not present

## 2021-10-31 DIAGNOSIS — M6281 Muscle weakness (generalized): Secondary | ICD-10-CM | POA: Diagnosis not present

## 2021-10-31 DIAGNOSIS — E118 Type 2 diabetes mellitus with unspecified complications: Secondary | ICD-10-CM | POA: Diagnosis not present

## 2021-10-31 DIAGNOSIS — E1165 Type 2 diabetes mellitus with hyperglycemia: Secondary | ICD-10-CM | POA: Diagnosis not present

## 2021-10-31 NOTE — Progress Notes (Signed)
? ?Office Visit Note ?  ?Patient: Michelle Skinner           ?Date of Birth: [birth year]           ?MRN: [id number] ?Visit Date: 10/16/2021 ?             ?Requested by: Monico Blitz, MD ?[address] ?[place],  [place] [address] ?PCP: Monico Blitz, MD ? ?Chief Complaint  ?Patient presents with  ? Right Knee - Pain  ? ? ? ? ?HPI: ?Patient is a 56 year old woman who presents follow-up status post bilateral transtibial amputations with a right knee injection for osteoarthritis about a month ago.  Patient states that her right knee is getting better. ? ?Assessment & Plan: ?Visit Diagnoses:  ?1. S/P bilateral below knee amputation ([place])   ? ? ?Plan: With patient's pain being over the medial femoral condyle this appears more consistent with subsiding into the socket pressure on the femur from the socket.  Patient is provided a prescription to modify the medial flare of the right socket. ? ?Follow-Up Instructions: Return in about 3 months (around 01/15/2022).  ? ?Ortho Exam ? ?Patient is alert, oriented, no adenopathy, well-dressed, normal affect, normal respiratory effort. ?On examination patient has pain where the prosthesis impacts the medial femoral condyle.  Patient has no pain on the left knee.  Patient also has instability with the socket rotating externally.  There is no redness no cellulitis no ulcer no signs of infection.  The left prosthetic leg fits and functions well. ? ?Imaging: ?No results found. ?No images are attached to the encounter. ? ?Labs: ?Lab Results  ?Component Value Date  ? HGBA1C 7.7 (A) 07/26/2021  ? HGBA1C 6.5 03/29/2021  ? HGBA1C 6.5 03/29/2021  ? REPTSTATUS 12/21/2020 FINAL 12/18/2020  ? GRAMSTAIN  09/10/2020  ?  RARE WBC PRESENT, PREDOMINANTLY PMN ?ABUNDANT GRAM POSITIVE COCCI ?RARE GRAM NEGATIVE RODS ?  ? CULT >=100,000 COLONIES/mL ESCHERICHIA COLI (A) 12/18/2020  ? LABORGA ESCHERICHIA COLI (A) 12/18/2020  ? ? ? ?Lab Results  ?Component Value Date  ? ALBUMIN 2.8 (L) 12/17/2020  ? ALBUMIN <1.0 (L)  09/17/2020  ? ALBUMIN <1.0 (L) 09/16/2020  ? PREALBUMIN 11.1 (L) 09/16/2020  ? ? ?Lab Results  ?Component Value Date  ? MG 2.2 09/17/2020  ? MG 1.9 09/16/2020  ? MG 1.9 09/15/2020  ? ?Lab Results  ?Component Value Date  ? VD25OH 12 (L) 10/20/2019  ? VD25OH 22 (L) 04/01/2019  ? VD25OH 29 (L) 08/17/2018  ? ? ?Lab Results  ?Component Value Date  ? PREALBUMIN 11.1 (L) 09/16/2020  ? ? ?  Latest Ref Rng & Units 12/17/2020  ? 11:31 PM 09/28/2020  ?  5:48 AM 09/17/2020  ?  3:40 AM  ?CBC EXTENDED  ?WBC 4.0 - 10.5 K/uL 10.1   3.2   11.9    ?RBC 3.87 - 5.11 MIL/uL 4.04   2.68   2.81    ?Hemoglobin 12.0 - 15.0 g/dL 12.0   7.9   8.2    ?HCT 36.0 - 46.0 % 36.7   25.4   25.7    ?Platelets 150 - 400 K/uL 173   220   308    ?NEUT# 1.7 - 7.7 K/uL 9.0   2.3     ?Lymph# 0.7 - 4.0 K/uL 0.4   0.6     ? ? ? ?There is no height or weight on file to calculate BMI. ? ?Orders:  ?No orders of the defined types were placed in this  encounter. ? ?No orders of the defined types were placed in this encounter. ? ? ? Procedures: ?No procedures performed ? ?Clinical Data: ?No additional findings. ? ?ROS: ? ?All other systems negative, except as noted in the HPI. ?Review of Systems ? ?Objective: ?Vital Signs: There were no vitals taken for this visit. ? ?Specialty Comments:  ?No specialty comments available. ? ?PMFS History: ?Patient Active Problem List  ? Diagnosis Date Noted  ? Peripherally inserted central catheter (PICC) in place 10/05/2020  ? MSSA bacteremia   ? Acute hematogenous osteomyelitis of both feet ([place])   ? Severe protein-calorie malnutrition ([place])   ? BMI 50.0-59.9, adult ([place])   ? Plantar ulcer of right foot ([place])   ? Plantar ulcer of left foot ([place])   ? Lymphedema   ? Sepsis ([place]) 09/06/2020  ? Cellulitis 04/27/2020  ? AKI (acute kidney injury) ([place]) 04/27/2020  ? Stasis dermatitis 04/27/2020  ? Anxiety 03/31/2020  ? Depression 03/31/2020  ? GERD (gastroesophageal reflux disease) 03/31/2020  ? Posttraumatic stress disorder 03/31/2020  ?  Personal history of noncompliance with medical treatment, presenting hazards to health 02/09/2018  ? Abnormal CT of the abdomen 10/20/2017  ? Dilated cbd, acquired 10/20/2017  ? Essential hypertension, benign 01/14/2017  ? Primary hypothyroidism 05/03/2015  ? Mixed hyperlipidemia 05/03/2015  ? 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 Morbid (severe) obesity due to excess calories ([place]) 04/25/2015  ? Vitamin D deficiency 04/25/2015  ? Cervical cancer ([place]) 10/05/2012  ? ?Past Medical History:  ?Diagnosis Date  ? Amputee, above knee, left ([place])   ? Amputee, above knee, right ([place])   ? Anemia   ? Cervical cancer ([place])   ? Depression   ? Diabetes mellitus, type II ([place])   ? GERD (gastroesophageal reflux disease)   ? Hyperlipidemia   ? Neuropathy   ? PTSD (post-traumatic stress disorder)   ?  ?Family History  ?Problem Relation Age of Onset  ? Hypertension Mother   ? Diabetes Father   ? Hyperlipidemia Father   ? CAD Father   ? Stroke Father   ? Osteoporosis Maternal Grandmother   ? Cancer Maternal Grandmother   ? Hypertension Maternal Grandmother   ? Colon cancer Neg Hx   ?  ?Past Surgical History:  ?Procedure Laterality Date  ? ABDOMINAL HYSTERECTOMY    ? AMPUTATION Bilateral 09/13/2020  ? Procedure: BILATERAL BELOW KNEE AMPUTATIONS;  Surgeon: [name], MD;  Location: [facility];  Service: Orthopedics;  Laterality: Bilateral;  ? COLONOSCOPY WITH PROPOFOL N/A 05/25/2018  ? Procedure: COLONOSCOPY WITH PROPOFOL;  Surgeon: Daneil Dolin, MD;  [facility];  Service: Endoscopy;  Laterality: N/A;  7:30am  ? I & D EXTREMITY Bilateral 09/10/2020  ? Procedure: IRRIGATION AND DEBRIDEMENT FEET;;  Surgeon: [name], MD;  Location: [facility];  Service: Orthopedics;  Laterality: Bilateral;  ? IRRIGATION AND DEBRIDEMENT FOOT Bilateral 09/08/2020  ? Procedure: IRRIGATION AND DEBRIDEMENT FOOT;  Surgeon: Virl Cagey, MD;  [facility];  Service: General;  Laterality: Bilateral;  left foot 11 x 10.5 x 1 ?   ? OTHER SURGICAL HISTORY Right   ? R Foot- I&D  ? POLYPECTOMY  05/25/2018  ? Procedure: POLYPECTOMY;  Surgeon: Daneil Dolin, MD;  [facility];  Service: Endoscopy;;  colon  ? UMBILICAL HERNIA REPAIR  2007  ? ?Social History  ? ?Occupational History  ? Not on file  ?Tobacco Use  ? Smoking status: Never  ?  Smokeless tobacco: Never  ?Vaping Use  ? Vaping Use: Never used  ?Substance and Sexual Activity  ? Alcohol use: No  ?  Alcohol/week: 0.0 standard drinks  ? Drug use: No  ? Sexual activity: Yes  ?  Birth control/protection: Surgical  ? ? ? ? ? ?

## 2021-11-01 DIAGNOSIS — M6281 Muscle weakness (generalized): Secondary | ICD-10-CM | POA: Diagnosis not present

## 2021-11-01 DIAGNOSIS — R2689 Other abnormalities of gait and mobility: Secondary | ICD-10-CM | POA: Diagnosis not present

## 2021-11-01 DIAGNOSIS — E118 Type 2 diabetes mellitus with unspecified complications: Secondary | ICD-10-CM | POA: Diagnosis not present

## 2021-11-02 DIAGNOSIS — R2689 Other abnormalities of gait and mobility: Secondary | ICD-10-CM | POA: Diagnosis not present

## 2021-11-02 DIAGNOSIS — E118 Type 2 diabetes mellitus with unspecified complications: Secondary | ICD-10-CM | POA: Diagnosis not present

## 2021-11-02 DIAGNOSIS — M6281 Muscle weakness (generalized): Secondary | ICD-10-CM | POA: Diagnosis not present

## 2021-11-13 DIAGNOSIS — M6281 Muscle weakness (generalized): Secondary | ICD-10-CM | POA: Diagnosis not present

## 2021-11-13 DIAGNOSIS — E118 Type 2 diabetes mellitus with unspecified complications: Secondary | ICD-10-CM | POA: Diagnosis not present

## 2021-11-13 DIAGNOSIS — R2689 Other abnormalities of gait and mobility: Secondary | ICD-10-CM | POA: Diagnosis not present

## 2021-11-13 DIAGNOSIS — R269 Unspecified abnormalities of gait and mobility: Secondary | ICD-10-CM | POA: Diagnosis not present

## 2021-11-14 DIAGNOSIS — R2689 Other abnormalities of gait and mobility: Secondary | ICD-10-CM | POA: Diagnosis not present

## 2021-11-14 DIAGNOSIS — M6281 Muscle weakness (generalized): Secondary | ICD-10-CM | POA: Diagnosis not present

## 2021-11-14 DIAGNOSIS — R269 Unspecified abnormalities of gait and mobility: Secondary | ICD-10-CM | POA: Diagnosis not present

## 2021-11-14 DIAGNOSIS — E118 Type 2 diabetes mellitus with unspecified complications: Secondary | ICD-10-CM | POA: Diagnosis not present

## 2021-11-15 DIAGNOSIS — R2689 Other abnormalities of gait and mobility: Secondary | ICD-10-CM | POA: Diagnosis not present

## 2021-11-15 DIAGNOSIS — M6281 Muscle weakness (generalized): Secondary | ICD-10-CM | POA: Diagnosis not present

## 2021-11-15 DIAGNOSIS — R269 Unspecified abnormalities of gait and mobility: Secondary | ICD-10-CM | POA: Diagnosis not present

## 2021-11-15 DIAGNOSIS — E118 Type 2 diabetes mellitus with unspecified complications: Secondary | ICD-10-CM | POA: Diagnosis not present

## 2021-11-15 DIAGNOSIS — E785 Hyperlipidemia, unspecified: Secondary | ICD-10-CM | POA: Diagnosis not present

## 2021-11-16 DIAGNOSIS — R2689 Other abnormalities of gait and mobility: Secondary | ICD-10-CM | POA: Diagnosis not present

## 2021-11-16 DIAGNOSIS — M6281 Muscle weakness (generalized): Secondary | ICD-10-CM | POA: Diagnosis not present

## 2021-11-16 DIAGNOSIS — E118 Type 2 diabetes mellitus with unspecified complications: Secondary | ICD-10-CM | POA: Diagnosis not present

## 2021-11-16 DIAGNOSIS — R269 Unspecified abnormalities of gait and mobility: Secondary | ICD-10-CM | POA: Diagnosis not present

## 2021-11-19 DIAGNOSIS — R269 Unspecified abnormalities of gait and mobility: Secondary | ICD-10-CM | POA: Diagnosis not present

## 2021-11-19 DIAGNOSIS — M6281 Muscle weakness (generalized): Secondary | ICD-10-CM | POA: Diagnosis not present

## 2021-11-19 DIAGNOSIS — E118 Type 2 diabetes mellitus with unspecified complications: Secondary | ICD-10-CM | POA: Diagnosis not present

## 2021-11-19 DIAGNOSIS — R2689 Other abnormalities of gait and mobility: Secondary | ICD-10-CM | POA: Diagnosis not present

## 2021-11-20 DIAGNOSIS — R2689 Other abnormalities of gait and mobility: Secondary | ICD-10-CM | POA: Diagnosis not present

## 2021-11-20 DIAGNOSIS — M6281 Muscle weakness (generalized): Secondary | ICD-10-CM | POA: Diagnosis not present

## 2021-11-20 DIAGNOSIS — R269 Unspecified abnormalities of gait and mobility: Secondary | ICD-10-CM | POA: Diagnosis not present

## 2021-11-20 DIAGNOSIS — E118 Type 2 diabetes mellitus with unspecified complications: Secondary | ICD-10-CM | POA: Diagnosis not present

## 2021-11-21 DIAGNOSIS — E118 Type 2 diabetes mellitus with unspecified complications: Secondary | ICD-10-CM | POA: Diagnosis not present

## 2021-11-21 DIAGNOSIS — M6281 Muscle weakness (generalized): Secondary | ICD-10-CM | POA: Diagnosis not present

## 2021-11-21 DIAGNOSIS — R269 Unspecified abnormalities of gait and mobility: Secondary | ICD-10-CM | POA: Diagnosis not present

## 2021-11-21 DIAGNOSIS — R2689 Other abnormalities of gait and mobility: Secondary | ICD-10-CM | POA: Diagnosis not present

## 2021-11-22 DIAGNOSIS — R2689 Other abnormalities of gait and mobility: Secondary | ICD-10-CM | POA: Diagnosis not present

## 2021-11-22 DIAGNOSIS — E118 Type 2 diabetes mellitus with unspecified complications: Secondary | ICD-10-CM | POA: Diagnosis not present

## 2021-11-22 DIAGNOSIS — M6281 Muscle weakness (generalized): Secondary | ICD-10-CM | POA: Diagnosis not present

## 2021-11-22 DIAGNOSIS — R269 Unspecified abnormalities of gait and mobility: Secondary | ICD-10-CM | POA: Diagnosis not present

## 2021-11-23 DIAGNOSIS — R2689 Other abnormalities of gait and mobility: Secondary | ICD-10-CM | POA: Diagnosis not present

## 2021-11-23 DIAGNOSIS — M6281 Muscle weakness (generalized): Secondary | ICD-10-CM | POA: Diagnosis not present

## 2021-11-23 DIAGNOSIS — E118 Type 2 diabetes mellitus with unspecified complications: Secondary | ICD-10-CM | POA: Diagnosis not present

## 2021-11-23 DIAGNOSIS — R21 Rash and other nonspecific skin eruption: Secondary | ICD-10-CM | POA: Diagnosis not present

## 2021-11-23 DIAGNOSIS — R269 Unspecified abnormalities of gait and mobility: Secondary | ICD-10-CM | POA: Diagnosis not present

## 2021-11-26 DIAGNOSIS — E118 Type 2 diabetes mellitus with unspecified complications: Secondary | ICD-10-CM | POA: Diagnosis not present

## 2021-11-26 DIAGNOSIS — R269 Unspecified abnormalities of gait and mobility: Secondary | ICD-10-CM | POA: Diagnosis not present

## 2021-11-26 DIAGNOSIS — R2689 Other abnormalities of gait and mobility: Secondary | ICD-10-CM | POA: Diagnosis not present

## 2021-11-26 DIAGNOSIS — M6281 Muscle weakness (generalized): Secondary | ICD-10-CM | POA: Diagnosis not present

## 2021-11-27 DIAGNOSIS — R269 Unspecified abnormalities of gait and mobility: Secondary | ICD-10-CM | POA: Diagnosis not present

## 2021-11-27 DIAGNOSIS — I10 Essential (primary) hypertension: Secondary | ICD-10-CM | POA: Diagnosis not present

## 2021-11-27 DIAGNOSIS — E118 Type 2 diabetes mellitus with unspecified complications: Secondary | ICD-10-CM | POA: Diagnosis not present

## 2021-11-27 DIAGNOSIS — M6281 Muscle weakness (generalized): Secondary | ICD-10-CM | POA: Diagnosis not present

## 2021-11-27 DIAGNOSIS — R2689 Other abnormalities of gait and mobility: Secondary | ICD-10-CM | POA: Diagnosis not present

## 2021-11-28 DIAGNOSIS — M6281 Muscle weakness (generalized): Secondary | ICD-10-CM | POA: Diagnosis not present

## 2021-11-28 DIAGNOSIS — R269 Unspecified abnormalities of gait and mobility: Secondary | ICD-10-CM | POA: Diagnosis not present

## 2021-11-28 DIAGNOSIS — R2689 Other abnormalities of gait and mobility: Secondary | ICD-10-CM | POA: Diagnosis not present

## 2021-11-28 DIAGNOSIS — E118 Type 2 diabetes mellitus with unspecified complications: Secondary | ICD-10-CM | POA: Diagnosis not present

## 2021-11-29 DIAGNOSIS — E119 Type 2 diabetes mellitus without complications: Secondary | ICD-10-CM | POA: Diagnosis not present

## 2021-11-29 DIAGNOSIS — R269 Unspecified abnormalities of gait and mobility: Secondary | ICD-10-CM | POA: Diagnosis not present

## 2021-11-29 DIAGNOSIS — R2689 Other abnormalities of gait and mobility: Secondary | ICD-10-CM | POA: Diagnosis not present

## 2021-11-29 DIAGNOSIS — M6281 Muscle weakness (generalized): Secondary | ICD-10-CM | POA: Diagnosis not present

## 2021-11-29 DIAGNOSIS — E118 Type 2 diabetes mellitus with unspecified complications: Secondary | ICD-10-CM | POA: Diagnosis not present

## 2021-11-30 DIAGNOSIS — R2689 Other abnormalities of gait and mobility: Secondary | ICD-10-CM | POA: Diagnosis not present

## 2021-11-30 DIAGNOSIS — E118 Type 2 diabetes mellitus with unspecified complications: Secondary | ICD-10-CM | POA: Diagnosis not present

## 2021-11-30 DIAGNOSIS — M6281 Muscle weakness (generalized): Secondary | ICD-10-CM | POA: Diagnosis not present

## 2021-11-30 DIAGNOSIS — R269 Unspecified abnormalities of gait and mobility: Secondary | ICD-10-CM | POA: Diagnosis not present

## 2021-11-30 DIAGNOSIS — E1165 Type 2 diabetes mellitus with hyperglycemia: Secondary | ICD-10-CM | POA: Diagnosis not present

## 2021-12-03 DIAGNOSIS — R269 Unspecified abnormalities of gait and mobility: Secondary | ICD-10-CM | POA: Diagnosis not present

## 2021-12-03 DIAGNOSIS — R2689 Other abnormalities of gait and mobility: Secondary | ICD-10-CM | POA: Diagnosis not present

## 2021-12-03 DIAGNOSIS — E118 Type 2 diabetes mellitus with unspecified complications: Secondary | ICD-10-CM | POA: Diagnosis not present

## 2021-12-03 DIAGNOSIS — M6281 Muscle weakness (generalized): Secondary | ICD-10-CM | POA: Diagnosis not present

## 2021-12-04 DIAGNOSIS — M6281 Muscle weakness (generalized): Secondary | ICD-10-CM | POA: Diagnosis not present

## 2021-12-04 DIAGNOSIS — R269 Unspecified abnormalities of gait and mobility: Secondary | ICD-10-CM | POA: Diagnosis not present

## 2021-12-04 DIAGNOSIS — E118 Type 2 diabetes mellitus with unspecified complications: Secondary | ICD-10-CM | POA: Diagnosis not present

## 2021-12-04 DIAGNOSIS — R2689 Other abnormalities of gait and mobility: Secondary | ICD-10-CM | POA: Diagnosis not present

## 2021-12-04 DIAGNOSIS — E113511 Type 2 diabetes mellitus with proliferative diabetic retinopathy with macular edema, right eye: Secondary | ICD-10-CM | POA: Diagnosis not present

## 2021-12-05 DIAGNOSIS — M6281 Muscle weakness (generalized): Secondary | ICD-10-CM | POA: Diagnosis not present

## 2021-12-05 DIAGNOSIS — R2689 Other abnormalities of gait and mobility: Secondary | ICD-10-CM | POA: Diagnosis not present

## 2021-12-05 DIAGNOSIS — R269 Unspecified abnormalities of gait and mobility: Secondary | ICD-10-CM | POA: Diagnosis not present

## 2021-12-05 DIAGNOSIS — E118 Type 2 diabetes mellitus with unspecified complications: Secondary | ICD-10-CM | POA: Diagnosis not present

## 2021-12-06 DIAGNOSIS — E118 Type 2 diabetes mellitus with unspecified complications: Secondary | ICD-10-CM | POA: Diagnosis not present

## 2021-12-06 DIAGNOSIS — R269 Unspecified abnormalities of gait and mobility: Secondary | ICD-10-CM | POA: Diagnosis not present

## 2021-12-06 DIAGNOSIS — M6281 Muscle weakness (generalized): Secondary | ICD-10-CM | POA: Diagnosis not present

## 2021-12-06 DIAGNOSIS — R2689 Other abnormalities of gait and mobility: Secondary | ICD-10-CM | POA: Diagnosis not present

## 2021-12-07 DIAGNOSIS — R269 Unspecified abnormalities of gait and mobility: Secondary | ICD-10-CM | POA: Diagnosis not present

## 2021-12-07 DIAGNOSIS — M6281 Muscle weakness (generalized): Secondary | ICD-10-CM | POA: Diagnosis not present

## 2021-12-07 DIAGNOSIS — E118 Type 2 diabetes mellitus with unspecified complications: Secondary | ICD-10-CM | POA: Diagnosis not present

## 2021-12-07 DIAGNOSIS — R2689 Other abnormalities of gait and mobility: Secondary | ICD-10-CM | POA: Diagnosis not present

## 2021-12-11 DIAGNOSIS — E118 Type 2 diabetes mellitus with unspecified complications: Secondary | ICD-10-CM | POA: Diagnosis not present

## 2021-12-11 DIAGNOSIS — R269 Unspecified abnormalities of gait and mobility: Secondary | ICD-10-CM | POA: Diagnosis not present

## 2021-12-11 DIAGNOSIS — M6281 Muscle weakness (generalized): Secondary | ICD-10-CM | POA: Diagnosis not present

## 2021-12-11 DIAGNOSIS — R2689 Other abnormalities of gait and mobility: Secondary | ICD-10-CM | POA: Diagnosis not present

## 2021-12-12 DIAGNOSIS — R269 Unspecified abnormalities of gait and mobility: Secondary | ICD-10-CM | POA: Diagnosis not present

## 2021-12-12 DIAGNOSIS — E118 Type 2 diabetes mellitus with unspecified complications: Secondary | ICD-10-CM | POA: Diagnosis not present

## 2021-12-12 DIAGNOSIS — R2689 Other abnormalities of gait and mobility: Secondary | ICD-10-CM | POA: Diagnosis not present

## 2021-12-12 DIAGNOSIS — M6281 Muscle weakness (generalized): Secondary | ICD-10-CM | POA: Diagnosis not present

## 2021-12-13 DIAGNOSIS — R269 Unspecified abnormalities of gait and mobility: Secondary | ICD-10-CM | POA: Diagnosis not present

## 2021-12-13 DIAGNOSIS — E118 Type 2 diabetes mellitus with unspecified complications: Secondary | ICD-10-CM | POA: Diagnosis not present

## 2021-12-13 DIAGNOSIS — M6281 Muscle weakness (generalized): Secondary | ICD-10-CM | POA: Diagnosis not present

## 2021-12-13 DIAGNOSIS — R2689 Other abnormalities of gait and mobility: Secondary | ICD-10-CM | POA: Diagnosis not present

## 2021-12-14 DIAGNOSIS — R269 Unspecified abnormalities of gait and mobility: Secondary | ICD-10-CM | POA: Diagnosis not present

## 2021-12-14 DIAGNOSIS — M6281 Muscle weakness (generalized): Secondary | ICD-10-CM | POA: Diagnosis not present

## 2021-12-14 DIAGNOSIS — I10 Essential (primary) hypertension: Secondary | ICD-10-CM | POA: Diagnosis not present

## 2021-12-14 DIAGNOSIS — E118 Type 2 diabetes mellitus with unspecified complications: Secondary | ICD-10-CM | POA: Diagnosis not present

## 2021-12-14 DIAGNOSIS — R2689 Other abnormalities of gait and mobility: Secondary | ICD-10-CM | POA: Diagnosis not present

## 2021-12-17 DIAGNOSIS — R2689 Other abnormalities of gait and mobility: Secondary | ICD-10-CM | POA: Diagnosis not present

## 2021-12-17 DIAGNOSIS — R269 Unspecified abnormalities of gait and mobility: Secondary | ICD-10-CM | POA: Diagnosis not present

## 2021-12-17 DIAGNOSIS — M6281 Muscle weakness (generalized): Secondary | ICD-10-CM | POA: Diagnosis not present

## 2021-12-17 DIAGNOSIS — R112 Nausea with vomiting, unspecified: Secondary | ICD-10-CM | POA: Diagnosis not present

## 2021-12-17 DIAGNOSIS — R109 Unspecified abdominal pain: Secondary | ICD-10-CM | POA: Diagnosis not present

## 2021-12-17 DIAGNOSIS — K3184 Gastroparesis: Secondary | ICD-10-CM | POA: Diagnosis not present

## 2021-12-17 DIAGNOSIS — R1084 Generalized abdominal pain: Secondary | ICD-10-CM | POA: Diagnosis not present

## 2021-12-17 DIAGNOSIS — E118 Type 2 diabetes mellitus with unspecified complications: Secondary | ICD-10-CM | POA: Diagnosis not present

## 2021-12-18 DIAGNOSIS — E785 Hyperlipidemia, unspecified: Secondary | ICD-10-CM | POA: Diagnosis not present

## 2021-12-18 DIAGNOSIS — I10 Essential (primary) hypertension: Secondary | ICD-10-CM | POA: Diagnosis not present

## 2021-12-18 DIAGNOSIS — E119 Type 2 diabetes mellitus without complications: Secondary | ICD-10-CM | POA: Diagnosis not present

## 2021-12-18 DIAGNOSIS — M6281 Muscle weakness (generalized): Secondary | ICD-10-CM | POA: Diagnosis not present

## 2021-12-18 DIAGNOSIS — R269 Unspecified abnormalities of gait and mobility: Secondary | ICD-10-CM | POA: Diagnosis not present

## 2021-12-18 DIAGNOSIS — E118 Type 2 diabetes mellitus with unspecified complications: Secondary | ICD-10-CM | POA: Diagnosis not present

## 2021-12-18 DIAGNOSIS — R2689 Other abnormalities of gait and mobility: Secondary | ICD-10-CM | POA: Diagnosis not present

## 2021-12-19 DIAGNOSIS — R269 Unspecified abnormalities of gait and mobility: Secondary | ICD-10-CM | POA: Diagnosis not present

## 2021-12-19 DIAGNOSIS — R2689 Other abnormalities of gait and mobility: Secondary | ICD-10-CM | POA: Diagnosis not present

## 2021-12-19 DIAGNOSIS — E118 Type 2 diabetes mellitus with unspecified complications: Secondary | ICD-10-CM | POA: Diagnosis not present

## 2021-12-19 DIAGNOSIS — M6281 Muscle weakness (generalized): Secondary | ICD-10-CM | POA: Diagnosis not present

## 2021-12-20 DIAGNOSIS — E118 Type 2 diabetes mellitus with unspecified complications: Secondary | ICD-10-CM | POA: Diagnosis not present

## 2021-12-20 DIAGNOSIS — M6281 Muscle weakness (generalized): Secondary | ICD-10-CM | POA: Diagnosis not present

## 2021-12-20 DIAGNOSIS — R269 Unspecified abnormalities of gait and mobility: Secondary | ICD-10-CM | POA: Diagnosis not present

## 2021-12-20 DIAGNOSIS — R2689 Other abnormalities of gait and mobility: Secondary | ICD-10-CM | POA: Diagnosis not present

## 2021-12-21 DIAGNOSIS — E118 Type 2 diabetes mellitus with unspecified complications: Secondary | ICD-10-CM | POA: Diagnosis not present

## 2021-12-21 DIAGNOSIS — M6281 Muscle weakness (generalized): Secondary | ICD-10-CM | POA: Diagnosis not present

## 2021-12-21 DIAGNOSIS — R2689 Other abnormalities of gait and mobility: Secondary | ICD-10-CM | POA: Diagnosis not present

## 2021-12-21 DIAGNOSIS — R269 Unspecified abnormalities of gait and mobility: Secondary | ICD-10-CM | POA: Diagnosis not present

## 2021-12-23 DIAGNOSIS — R269 Unspecified abnormalities of gait and mobility: Secondary | ICD-10-CM | POA: Diagnosis not present

## 2021-12-23 DIAGNOSIS — M6281 Muscle weakness (generalized): Secondary | ICD-10-CM | POA: Diagnosis not present

## 2021-12-23 DIAGNOSIS — R2689 Other abnormalities of gait and mobility: Secondary | ICD-10-CM | POA: Diagnosis not present

## 2021-12-23 DIAGNOSIS — E118 Type 2 diabetes mellitus with unspecified complications: Secondary | ICD-10-CM | POA: Diagnosis not present

## 2021-12-25 DIAGNOSIS — R2689 Other abnormalities of gait and mobility: Secondary | ICD-10-CM | POA: Diagnosis not present

## 2021-12-25 DIAGNOSIS — R269 Unspecified abnormalities of gait and mobility: Secondary | ICD-10-CM | POA: Diagnosis not present

## 2021-12-25 DIAGNOSIS — M6281 Muscle weakness (generalized): Secondary | ICD-10-CM | POA: Diagnosis not present

## 2021-12-25 DIAGNOSIS — E118 Type 2 diabetes mellitus with unspecified complications: Secondary | ICD-10-CM | POA: Diagnosis not present

## 2021-12-26 DIAGNOSIS — R269 Unspecified abnormalities of gait and mobility: Secondary | ICD-10-CM | POA: Diagnosis not present

## 2021-12-26 DIAGNOSIS — M6281 Muscle weakness (generalized): Secondary | ICD-10-CM | POA: Diagnosis not present

## 2021-12-26 DIAGNOSIS — E118 Type 2 diabetes mellitus with unspecified complications: Secondary | ICD-10-CM | POA: Diagnosis not present

## 2021-12-26 DIAGNOSIS — R2689 Other abnormalities of gait and mobility: Secondary | ICD-10-CM | POA: Diagnosis not present

## 2021-12-28 DIAGNOSIS — R2689 Other abnormalities of gait and mobility: Secondary | ICD-10-CM | POA: Diagnosis not present

## 2021-12-28 DIAGNOSIS — E118 Type 2 diabetes mellitus with unspecified complications: Secondary | ICD-10-CM | POA: Diagnosis not present

## 2021-12-28 DIAGNOSIS — M6281 Muscle weakness (generalized): Secondary | ICD-10-CM | POA: Diagnosis not present

## 2021-12-28 DIAGNOSIS — R269 Unspecified abnormalities of gait and mobility: Secondary | ICD-10-CM | POA: Diagnosis not present

## 2021-12-29 DIAGNOSIS — E118 Type 2 diabetes mellitus with unspecified complications: Secondary | ICD-10-CM | POA: Diagnosis not present

## 2021-12-29 DIAGNOSIS — M6281 Muscle weakness (generalized): Secondary | ICD-10-CM | POA: Diagnosis not present

## 2021-12-29 DIAGNOSIS — R2689 Other abnormalities of gait and mobility: Secondary | ICD-10-CM | POA: Diagnosis not present

## 2021-12-29 DIAGNOSIS — R269 Unspecified abnormalities of gait and mobility: Secondary | ICD-10-CM | POA: Diagnosis not present

## 2021-12-31 DIAGNOSIS — M6281 Muscle weakness (generalized): Secondary | ICD-10-CM | POA: Diagnosis not present

## 2021-12-31 DIAGNOSIS — E118 Type 2 diabetes mellitus with unspecified complications: Secondary | ICD-10-CM | POA: Diagnosis not present

## 2021-12-31 DIAGNOSIS — R2689 Other abnormalities of gait and mobility: Secondary | ICD-10-CM | POA: Diagnosis not present

## 2021-12-31 DIAGNOSIS — E1165 Type 2 diabetes mellitus with hyperglycemia: Secondary | ICD-10-CM | POA: Diagnosis not present

## 2021-12-31 DIAGNOSIS — R269 Unspecified abnormalities of gait and mobility: Secondary | ICD-10-CM | POA: Diagnosis not present

## 2022-01-01 DIAGNOSIS — H2513 Age-related nuclear cataract, bilateral: Secondary | ICD-10-CM | POA: Diagnosis not present

## 2022-01-01 DIAGNOSIS — E113512 Type 2 diabetes mellitus with proliferative diabetic retinopathy with macular edema, left eye: Secondary | ICD-10-CM | POA: Diagnosis not present

## 2022-01-01 DIAGNOSIS — E113511 Type 2 diabetes mellitus with proliferative diabetic retinopathy with macular edema, right eye: Secondary | ICD-10-CM | POA: Diagnosis not present

## 2022-01-01 DIAGNOSIS — H4313 Vitreous hemorrhage, bilateral: Secondary | ICD-10-CM | POA: Diagnosis not present

## 2022-01-02 DIAGNOSIS — E118 Type 2 diabetes mellitus with unspecified complications: Secondary | ICD-10-CM | POA: Diagnosis not present

## 2022-01-02 DIAGNOSIS — H5712 Ocular pain, left eye: Secondary | ICD-10-CM | POA: Diagnosis not present

## 2022-01-02 DIAGNOSIS — R269 Unspecified abnormalities of gait and mobility: Secondary | ICD-10-CM | POA: Diagnosis not present

## 2022-01-02 DIAGNOSIS — M6281 Muscle weakness (generalized): Secondary | ICD-10-CM | POA: Diagnosis not present

## 2022-01-02 DIAGNOSIS — R2689 Other abnormalities of gait and mobility: Secondary | ICD-10-CM | POA: Diagnosis not present

## 2022-01-03 DIAGNOSIS — E118 Type 2 diabetes mellitus with unspecified complications: Secondary | ICD-10-CM | POA: Diagnosis not present

## 2022-01-03 DIAGNOSIS — R269 Unspecified abnormalities of gait and mobility: Secondary | ICD-10-CM | POA: Diagnosis not present

## 2022-01-03 DIAGNOSIS — R2689 Other abnormalities of gait and mobility: Secondary | ICD-10-CM | POA: Diagnosis not present

## 2022-01-03 DIAGNOSIS — M6281 Muscle weakness (generalized): Secondary | ICD-10-CM | POA: Diagnosis not present

## 2022-01-04 DIAGNOSIS — M6281 Muscle weakness (generalized): Secondary | ICD-10-CM | POA: Diagnosis not present

## 2022-01-04 DIAGNOSIS — R2689 Other abnormalities of gait and mobility: Secondary | ICD-10-CM | POA: Diagnosis not present

## 2022-01-04 DIAGNOSIS — T8789 Other complications of amputation stump: Secondary | ICD-10-CM | POA: Diagnosis not present

## 2022-01-04 DIAGNOSIS — R269 Unspecified abnormalities of gait and mobility: Secondary | ICD-10-CM | POA: Diagnosis not present

## 2022-01-04 DIAGNOSIS — E118 Type 2 diabetes mellitus with unspecified complications: Secondary | ICD-10-CM | POA: Diagnosis not present

## 2022-01-07 DIAGNOSIS — R2689 Other abnormalities of gait and mobility: Secondary | ICD-10-CM | POA: Diagnosis not present

## 2022-01-07 DIAGNOSIS — M6281 Muscle weakness (generalized): Secondary | ICD-10-CM | POA: Diagnosis not present

## 2022-01-07 DIAGNOSIS — E118 Type 2 diabetes mellitus with unspecified complications: Secondary | ICD-10-CM | POA: Diagnosis not present

## 2022-01-07 DIAGNOSIS — R269 Unspecified abnormalities of gait and mobility: Secondary | ICD-10-CM | POA: Diagnosis not present

## 2022-01-08 DIAGNOSIS — M6281 Muscle weakness (generalized): Secondary | ICD-10-CM | POA: Diagnosis not present

## 2022-01-08 DIAGNOSIS — R269 Unspecified abnormalities of gait and mobility: Secondary | ICD-10-CM | POA: Diagnosis not present

## 2022-01-08 DIAGNOSIS — E118 Type 2 diabetes mellitus with unspecified complications: Secondary | ICD-10-CM | POA: Diagnosis not present

## 2022-01-08 DIAGNOSIS — R2689 Other abnormalities of gait and mobility: Secondary | ICD-10-CM | POA: Diagnosis not present

## 2022-01-09 DIAGNOSIS — R2689 Other abnormalities of gait and mobility: Secondary | ICD-10-CM | POA: Diagnosis not present

## 2022-01-09 DIAGNOSIS — E118 Type 2 diabetes mellitus with unspecified complications: Secondary | ICD-10-CM | POA: Diagnosis not present

## 2022-01-09 DIAGNOSIS — R269 Unspecified abnormalities of gait and mobility: Secondary | ICD-10-CM | POA: Diagnosis not present

## 2022-01-09 DIAGNOSIS — M6281 Muscle weakness (generalized): Secondary | ICD-10-CM | POA: Diagnosis not present

## 2022-01-10 DIAGNOSIS — I739 Peripheral vascular disease, unspecified: Secondary | ICD-10-CM | POA: Diagnosis not present

## 2022-01-10 DIAGNOSIS — R2689 Other abnormalities of gait and mobility: Secondary | ICD-10-CM | POA: Diagnosis not present

## 2022-01-10 DIAGNOSIS — E118 Type 2 diabetes mellitus with unspecified complications: Secondary | ICD-10-CM | POA: Diagnosis not present

## 2022-01-10 DIAGNOSIS — R269 Unspecified abnormalities of gait and mobility: Secondary | ICD-10-CM | POA: Diagnosis not present

## 2022-01-10 DIAGNOSIS — M6281 Muscle weakness (generalized): Secondary | ICD-10-CM | POA: Diagnosis not present

## 2022-01-11 DIAGNOSIS — M6281 Muscle weakness (generalized): Secondary | ICD-10-CM | POA: Diagnosis not present

## 2022-01-11 DIAGNOSIS — R269 Unspecified abnormalities of gait and mobility: Secondary | ICD-10-CM | POA: Diagnosis not present

## 2022-01-11 DIAGNOSIS — E118 Type 2 diabetes mellitus with unspecified complications: Secondary | ICD-10-CM | POA: Diagnosis not present

## 2022-01-11 DIAGNOSIS — R2689 Other abnormalities of gait and mobility: Secondary | ICD-10-CM | POA: Diagnosis not present

## 2022-01-14 ENCOUNTER — Ambulatory Visit: Payer: Medicare Other | Admitting: Orthopedic Surgery

## 2022-01-14 DIAGNOSIS — R2689 Other abnormalities of gait and mobility: Secondary | ICD-10-CM | POA: Diagnosis not present

## 2022-01-14 DIAGNOSIS — E118 Type 2 diabetes mellitus with unspecified complications: Secondary | ICD-10-CM | POA: Diagnosis not present

## 2022-01-14 DIAGNOSIS — R269 Unspecified abnormalities of gait and mobility: Secondary | ICD-10-CM | POA: Diagnosis not present

## 2022-01-14 DIAGNOSIS — M6281 Muscle weakness (generalized): Secondary | ICD-10-CM | POA: Diagnosis not present

## 2022-01-15 DIAGNOSIS — R269 Unspecified abnormalities of gait and mobility: Secondary | ICD-10-CM | POA: Diagnosis not present

## 2022-01-15 DIAGNOSIS — E118 Type 2 diabetes mellitus with unspecified complications: Secondary | ICD-10-CM | POA: Diagnosis not present

## 2022-01-15 DIAGNOSIS — R2689 Other abnormalities of gait and mobility: Secondary | ICD-10-CM | POA: Diagnosis not present

## 2022-01-15 DIAGNOSIS — M6281 Muscle weakness (generalized): Secondary | ICD-10-CM | POA: Diagnosis not present

## 2022-01-16 ENCOUNTER — Ambulatory Visit (INDEPENDENT_AMBULATORY_CARE_PROVIDER_SITE_OTHER): Payer: Medicare Other | Admitting: Family

## 2022-01-16 ENCOUNTER — Encounter: Payer: Self-pay | Admitting: Family

## 2022-01-16 DIAGNOSIS — Z89512 Acquired absence of left leg below knee: Secondary | ICD-10-CM | POA: Diagnosis not present

## 2022-01-16 DIAGNOSIS — R2689 Other abnormalities of gait and mobility: Secondary | ICD-10-CM | POA: Diagnosis not present

## 2022-01-16 DIAGNOSIS — R269 Unspecified abnormalities of gait and mobility: Secondary | ICD-10-CM | POA: Diagnosis not present

## 2022-01-16 DIAGNOSIS — M6281 Muscle weakness (generalized): Secondary | ICD-10-CM | POA: Diagnosis not present

## 2022-01-16 DIAGNOSIS — Z89511 Acquired absence of right leg below knee: Secondary | ICD-10-CM | POA: Diagnosis not present

## 2022-01-16 DIAGNOSIS — E118 Type 2 diabetes mellitus with unspecified complications: Secondary | ICD-10-CM | POA: Diagnosis not present

## 2022-01-16 NOTE — Progress Notes (Signed)
Office Visit Note   Patient: Michelle Skinner           Date of Birth: July 26, 1965           MRN: [id number] Visit Date: 01/16/2022              Requested by: Monico Blitz, MD [address] [place],  [place] [address] PCP: Monico Blitz, MD  Chief Complaint  Patient presents with   Right Knee - Follow-up    Hx right BKA      HPI: The patient is a 56 year old woman who is seen today in follow-up.  She is status post bilateral below-knee amputations she was last seen in April at that time she was sent to her prosthetists to have modifications to her right socket over the medial femoral condyle where she had been having issues subsiding into the socket and pain from pressure on the femur  She states that this has been resolved  Today her main concern is edema which she feels might be worsening to bilateral lower extremities as well as some sweating she feels that her silicone liners caused her to break out and they pinch and moved from time to time  When she wears her shrinkers underneath her silicone liners she feels much better as far as discomfort is concerned however she is concerned about sweating from wearing the shrinkers  States that she has had fluid blisters breaking out over her bilateral incisions  Assessment & Plan: Visit Diagnoses: No diagnosis found.  Plan: Given a prescription for prosthesis supplies.  She is to wear shrinkers or liner liners underneath her silicon liners whenever wearing her prostheses  Follow-Up Instructions: No follow-ups on file.   Ortho Exam  Patient is alert, oriented, no adenopathy, well-dressed, normal affect, normal respiratory effort. On examination bilateral below-knee amputations these are well consolidated and well-healed there is no erythema no skin breakdown no impending skin breakdown there is no fluid-filled blister no drainage or weeping  Imaging: No results found. No images are attached to the encounter.  Labs: Lab Results   Component Value Date   HGBA1C 7.7 (A) 07/26/2021   HGBA1C 6.5 03/29/2021   HGBA1C 6.5 03/29/2021   REPTSTATUS 12/21/2020 FINAL 12/18/2020   GRAMSTAIN  09/10/2020    RARE WBC PRESENT, PREDOMINANTLY PMN ABUNDANT GRAM POSITIVE COCCI RARE GRAM NEGATIVE RODS    CULT >=100,000 COLONIES/mL ESCHERICHIA COLI (A) 12/18/2020   LABORGA ESCHERICHIA COLI (A) 12/18/2020     Lab Results  Component Value Date   ALBUMIN 2.8 (L) 12/17/2020   ALBUMIN <1.0 (L) 09/17/2020   ALBUMIN <1.0 (L) 09/16/2020   PREALBUMIN 11.1 (L) 09/16/2020    Lab Results  Component Value Date   MG 2.2 09/17/2020   MG 1.9 09/16/2020   MG 1.9 09/15/2020   Lab Results  Component Value Date   VD25OH 12 (L) 10/20/2019   VD25OH 22 (L) 04/01/2019   VD25OH 29 (L) 08/17/2018    Lab Results  Component Value Date   PREALBUMIN 11.1 (L) 09/16/2020      Latest Ref Rng & Units 12/17/2020   11:31 PM 09/28/2020    5:48 AM 09/17/2020    3:40 AM  CBC EXTENDED  WBC 4.0 - 10.5 K/uL 10.1  3.2  11.9   RBC 3.87 - 5.11 MIL/uL 4.04  2.68  2.81   Hemoglobin 12.0 - 15.0 g/dL 12.0  7.9  8.2   HCT 36.0 - 46.0 % 36.7  25.4  25.7   Platelets  150 - 400 K/uL 173  220  308   NEUT# 1.7 - 7.7 K/uL 9.0  2.3    Lymph# 0.7 - 4.0 K/uL 0.4  0.6       There is no height or weight on file to calculate BMI.  Orders:  No orders of the defined types were placed in this encounter.  No orders of the defined types were placed in this encounter.    Procedures: No procedures performed  Clinical Data: No additional findings.  ROS:  All other systems negative, except as noted in the HPI. Review of Systems  Objective: Vital Signs: There were no vitals taken for this visit.  Specialty Comments:  No specialty comments available.  PMFS History: Patient Active Problem List   Diagnosis Date Noted   Peripherally inserted central catheter (PICC) in place 10/05/2020   MSSA bacteremia    Acute hematogenous osteomyelitis of both feet ([place])     Severe protein-calorie malnutrition ([place])    BMI 50.0-59.9, adult ([place])    Plantar ulcer of right foot ([place])    Plantar ulcer of left foot ([place])    Lymphedema    Sepsis ([place]) 09/06/2020   Cellulitis 04/27/2020   AKI (acute kidney injury) ([place]) 04/27/2020   Stasis dermatitis 04/27/2020   Anxiety 03/31/2020   Depression 03/31/2020   GERD (gastroesophageal reflux disease) 03/31/2020   Posttraumatic stress disorder 03/31/2020   Personal history of noncompliance with medical treatment, presenting hazards to health 02/09/2018   Abnormal CT of the abdomen 10/20/2017   Dilated cbd, acquired 10/20/2017   Essential hypertension, benign 01/14/2017   Primary hypothyroidism 05/03/2015   Mixed hyperlipidemia 05/03/2015   DM type 2 causing vascular disease ([place]) 04/25/2015   Morbid (severe) obesity due to excess calories ([place]) 04/25/2015   Vitamin D deficiency 04/25/2015   Cervical cancer ([place]) 10/05/2012   Past Medical History:  Diagnosis Date   Amputee, above knee, left ([facility])    Amputee, above knee, right ([facility])    Anemia    Cervical cancer ([place])    Depression    Diabetes mellitus, type II ([place])    GERD (gastroesophageal reflux disease)    Hyperlipidemia    Neuropathy    PTSD (post-traumatic stress disorder)     Family History  Problem Relation Age of Onset   Hypertension Mother    Diabetes Father    Hyperlipidemia Father    CAD Father    Stroke Father    Osteoporosis Maternal Grandmother    Cancer Maternal Grandmother    Hypertension Maternal Grandmother    Colon cancer Neg Hx     Past Surgical History:  Procedure Laterality Date   ABDOMINAL HYSTERECTOMY     AMPUTATION Bilateral 09/13/2020   Procedure: BILATERAL BELOW KNEE AMPUTATIONS;  Surgeon: [name], MD;  Location: [facility];  Service: Orthopedics;  Laterality: Bilateral;   COLONOSCOPY WITH PROPOFOL N/A 05/25/2018   Procedure: COLONOSCOPY WITH PROPOFOL;  Surgeon: Daneil Dolin, MD;  [facility];   Service: Endoscopy;  Laterality: N/A;  7:30am   I & D EXTREMITY Bilateral 09/10/2020   Procedure: IRRIGATION AND DEBRIDEMENT FEET;;  Surgeon: [name], MD;  Location: [facility];  Service: Orthopedics;  Laterality: Bilateral;   IRRIGATION AND DEBRIDEMENT FOOT Bilateral 09/08/2020   Procedure: IRRIGATION AND DEBRIDEMENT FOOT;  Surgeon: Virl Cagey, MD;  [facility];  Service: General;  Laterality: Bilateral;  left foot 11 x 10.5 x 1    OTHER SURGICAL HISTORY Right  R Foot- I&D   POLYPECTOMY  05/25/2018   Procedure: POLYPECTOMY;  Surgeon: Daneil Dolin, MD;  [facility];  Service: Endoscopy;;  colon   UMBILICAL HERNIA REPAIR  2007   Social History   Occupational History   Not on file  Tobacco Use   Smoking status: Never   Smokeless tobacco: Never  Vaping Use   Vaping Use: Never used  Substance and Sexual Activity   Alcohol use: No    Alcohol/week: 0.0 standard drinks of alcohol   Drug use: No   Sexual activity: Yes    Birth control/protection: Surgical

## 2022-01-17 DIAGNOSIS — M6281 Muscle weakness (generalized): Secondary | ICD-10-CM | POA: Diagnosis not present

## 2022-01-17 DIAGNOSIS — I10 Essential (primary) hypertension: Secondary | ICD-10-CM | POA: Diagnosis not present

## 2022-01-17 DIAGNOSIS — R269 Unspecified abnormalities of gait and mobility: Secondary | ICD-10-CM | POA: Diagnosis not present

## 2022-01-17 DIAGNOSIS — E118 Type 2 diabetes mellitus with unspecified complications: Secondary | ICD-10-CM | POA: Diagnosis not present

## 2022-01-17 DIAGNOSIS — R2689 Other abnormalities of gait and mobility: Secondary | ICD-10-CM | POA: Diagnosis not present

## 2022-01-20 DIAGNOSIS — E118 Type 2 diabetes mellitus with unspecified complications: Secondary | ICD-10-CM | POA: Diagnosis not present

## 2022-01-20 DIAGNOSIS — R2689 Other abnormalities of gait and mobility: Secondary | ICD-10-CM | POA: Diagnosis not present

## 2022-01-20 DIAGNOSIS — R269 Unspecified abnormalities of gait and mobility: Secondary | ICD-10-CM | POA: Diagnosis not present

## 2022-01-20 DIAGNOSIS — M6281 Muscle weakness (generalized): Secondary | ICD-10-CM | POA: Diagnosis not present

## 2022-01-21 ENCOUNTER — Ambulatory Visit: Payer: Medicare Other | Admitting: Orthopedic Surgery

## 2022-01-21 DIAGNOSIS — R2689 Other abnormalities of gait and mobility: Secondary | ICD-10-CM | POA: Diagnosis not present

## 2022-01-21 DIAGNOSIS — R269 Unspecified abnormalities of gait and mobility: Secondary | ICD-10-CM | POA: Diagnosis not present

## 2022-01-21 DIAGNOSIS — M6281 Muscle weakness (generalized): Secondary | ICD-10-CM | POA: Diagnosis not present

## 2022-01-21 DIAGNOSIS — E118 Type 2 diabetes mellitus with unspecified complications: Secondary | ICD-10-CM | POA: Diagnosis not present

## 2022-01-22 DIAGNOSIS — R2689 Other abnormalities of gait and mobility: Secondary | ICD-10-CM | POA: Diagnosis not present

## 2022-01-22 DIAGNOSIS — M6281 Muscle weakness (generalized): Secondary | ICD-10-CM | POA: Diagnosis not present

## 2022-01-22 DIAGNOSIS — R269 Unspecified abnormalities of gait and mobility: Secondary | ICD-10-CM | POA: Diagnosis not present

## 2022-01-22 DIAGNOSIS — E118 Type 2 diabetes mellitus with unspecified complications: Secondary | ICD-10-CM | POA: Diagnosis not present

## 2022-01-23 DIAGNOSIS — E118 Type 2 diabetes mellitus with unspecified complications: Secondary | ICD-10-CM | POA: Diagnosis not present

## 2022-01-23 DIAGNOSIS — R269 Unspecified abnormalities of gait and mobility: Secondary | ICD-10-CM | POA: Diagnosis not present

## 2022-01-23 DIAGNOSIS — M6281 Muscle weakness (generalized): Secondary | ICD-10-CM | POA: Diagnosis not present

## 2022-01-23 DIAGNOSIS — R2689 Other abnormalities of gait and mobility: Secondary | ICD-10-CM | POA: Diagnosis not present

## 2022-01-24 ENCOUNTER — Ambulatory Visit: Payer: Medicare Other | Admitting: "Endocrinology

## 2022-01-25 DIAGNOSIS — E118 Type 2 diabetes mellitus with unspecified complications: Secondary | ICD-10-CM | POA: Diagnosis not present

## 2022-01-25 DIAGNOSIS — R2689 Other abnormalities of gait and mobility: Secondary | ICD-10-CM | POA: Diagnosis not present

## 2022-01-25 DIAGNOSIS — M6281 Muscle weakness (generalized): Secondary | ICD-10-CM | POA: Diagnosis not present

## 2022-01-25 DIAGNOSIS — R269 Unspecified abnormalities of gait and mobility: Secondary | ICD-10-CM | POA: Diagnosis not present

## 2022-01-26 DIAGNOSIS — M6281 Muscle weakness (generalized): Secondary | ICD-10-CM | POA: Diagnosis not present

## 2022-01-26 DIAGNOSIS — R2689 Other abnormalities of gait and mobility: Secondary | ICD-10-CM | POA: Diagnosis not present

## 2022-01-26 DIAGNOSIS — E118 Type 2 diabetes mellitus with unspecified complications: Secondary | ICD-10-CM | POA: Diagnosis not present

## 2022-01-26 DIAGNOSIS — R269 Unspecified abnormalities of gait and mobility: Secondary | ICD-10-CM | POA: Diagnosis not present

## 2022-01-28 DIAGNOSIS — M6281 Muscle weakness (generalized): Secondary | ICD-10-CM | POA: Diagnosis not present

## 2022-01-28 DIAGNOSIS — E118 Type 2 diabetes mellitus with unspecified complications: Secondary | ICD-10-CM | POA: Diagnosis not present

## 2022-01-28 DIAGNOSIS — R269 Unspecified abnormalities of gait and mobility: Secondary | ICD-10-CM | POA: Diagnosis not present

## 2022-01-28 DIAGNOSIS — R2689 Other abnormalities of gait and mobility: Secondary | ICD-10-CM | POA: Diagnosis not present

## 2022-01-29 DIAGNOSIS — R269 Unspecified abnormalities of gait and mobility: Secondary | ICD-10-CM | POA: Diagnosis not present

## 2022-01-29 DIAGNOSIS — E118 Type 2 diabetes mellitus with unspecified complications: Secondary | ICD-10-CM | POA: Diagnosis not present

## 2022-01-29 DIAGNOSIS — M6281 Muscle weakness (generalized): Secondary | ICD-10-CM | POA: Diagnosis not present

## 2022-01-29 DIAGNOSIS — R2689 Other abnormalities of gait and mobility: Secondary | ICD-10-CM | POA: Diagnosis not present

## 2022-01-30 DIAGNOSIS — M6281 Muscle weakness (generalized): Secondary | ICD-10-CM | POA: Diagnosis not present

## 2022-01-30 DIAGNOSIS — R269 Unspecified abnormalities of gait and mobility: Secondary | ICD-10-CM | POA: Diagnosis not present

## 2022-01-30 DIAGNOSIS — R2689 Other abnormalities of gait and mobility: Secondary | ICD-10-CM | POA: Diagnosis not present

## 2022-01-30 DIAGNOSIS — E118 Type 2 diabetes mellitus with unspecified complications: Secondary | ICD-10-CM | POA: Diagnosis not present

## 2022-01-30 DIAGNOSIS — E1165 Type 2 diabetes mellitus with hyperglycemia: Secondary | ICD-10-CM | POA: Diagnosis not present

## 2022-01-31 DIAGNOSIS — R269 Unspecified abnormalities of gait and mobility: Secondary | ICD-10-CM | POA: Diagnosis not present

## 2022-01-31 DIAGNOSIS — E118 Type 2 diabetes mellitus with unspecified complications: Secondary | ICD-10-CM | POA: Diagnosis not present

## 2022-01-31 DIAGNOSIS — R2689 Other abnormalities of gait and mobility: Secondary | ICD-10-CM | POA: Diagnosis not present

## 2022-01-31 DIAGNOSIS — M6281 Muscle weakness (generalized): Secondary | ICD-10-CM | POA: Diagnosis not present

## 2022-02-01 DIAGNOSIS — M6281 Muscle weakness (generalized): Secondary | ICD-10-CM | POA: Diagnosis not present

## 2022-02-01 DIAGNOSIS — R2689 Other abnormalities of gait and mobility: Secondary | ICD-10-CM | POA: Diagnosis not present

## 2022-02-01 DIAGNOSIS — E118 Type 2 diabetes mellitus with unspecified complications: Secondary | ICD-10-CM | POA: Diagnosis not present

## 2022-02-01 DIAGNOSIS — R269 Unspecified abnormalities of gait and mobility: Secondary | ICD-10-CM | POA: Diagnosis not present

## 2022-02-04 DIAGNOSIS — M6281 Muscle weakness (generalized): Secondary | ICD-10-CM | POA: Diagnosis not present

## 2022-02-04 DIAGNOSIS — E118 Type 2 diabetes mellitus with unspecified complications: Secondary | ICD-10-CM | POA: Diagnosis not present

## 2022-02-04 DIAGNOSIS — R2689 Other abnormalities of gait and mobility: Secondary | ICD-10-CM | POA: Diagnosis not present

## 2022-02-04 DIAGNOSIS — R269 Unspecified abnormalities of gait and mobility: Secondary | ICD-10-CM | POA: Diagnosis not present

## 2022-02-05 DIAGNOSIS — M6281 Muscle weakness (generalized): Secondary | ICD-10-CM | POA: Diagnosis not present

## 2022-02-05 DIAGNOSIS — R2689 Other abnormalities of gait and mobility: Secondary | ICD-10-CM | POA: Diagnosis not present

## 2022-02-05 DIAGNOSIS — R269 Unspecified abnormalities of gait and mobility: Secondary | ICD-10-CM | POA: Diagnosis not present

## 2022-02-05 DIAGNOSIS — I10 Essential (primary) hypertension: Secondary | ICD-10-CM | POA: Diagnosis not present

## 2022-02-05 DIAGNOSIS — E118 Type 2 diabetes mellitus with unspecified complications: Secondary | ICD-10-CM | POA: Diagnosis not present

## 2022-02-06 DIAGNOSIS — R269 Unspecified abnormalities of gait and mobility: Secondary | ICD-10-CM | POA: Diagnosis not present

## 2022-02-06 DIAGNOSIS — R2689 Other abnormalities of gait and mobility: Secondary | ICD-10-CM | POA: Diagnosis not present

## 2022-02-06 DIAGNOSIS — M6281 Muscle weakness (generalized): Secondary | ICD-10-CM | POA: Diagnosis not present

## 2022-02-06 DIAGNOSIS — E118 Type 2 diabetes mellitus with unspecified complications: Secondary | ICD-10-CM | POA: Diagnosis not present

## 2022-02-07 DIAGNOSIS — R269 Unspecified abnormalities of gait and mobility: Secondary | ICD-10-CM | POA: Diagnosis not present

## 2022-02-07 DIAGNOSIS — M6281 Muscle weakness (generalized): Secondary | ICD-10-CM | POA: Diagnosis not present

## 2022-02-07 DIAGNOSIS — E118 Type 2 diabetes mellitus with unspecified complications: Secondary | ICD-10-CM | POA: Diagnosis not present

## 2022-02-07 DIAGNOSIS — F32A Depression, unspecified: Secondary | ICD-10-CM | POA: Diagnosis not present

## 2022-02-07 DIAGNOSIS — R2689 Other abnormalities of gait and mobility: Secondary | ICD-10-CM | POA: Diagnosis not present

## 2022-02-08 DIAGNOSIS — E118 Type 2 diabetes mellitus with unspecified complications: Secondary | ICD-10-CM | POA: Diagnosis not present

## 2022-02-08 DIAGNOSIS — R269 Unspecified abnormalities of gait and mobility: Secondary | ICD-10-CM | POA: Diagnosis not present

## 2022-02-08 DIAGNOSIS — M6281 Muscle weakness (generalized): Secondary | ICD-10-CM | POA: Diagnosis not present

## 2022-02-08 DIAGNOSIS — R2689 Other abnormalities of gait and mobility: Secondary | ICD-10-CM | POA: Diagnosis not present

## 2022-02-11 DIAGNOSIS — E118 Type 2 diabetes mellitus with unspecified complications: Secondary | ICD-10-CM | POA: Diagnosis not present

## 2022-02-11 DIAGNOSIS — R269 Unspecified abnormalities of gait and mobility: Secondary | ICD-10-CM | POA: Diagnosis not present

## 2022-02-11 DIAGNOSIS — R2689 Other abnormalities of gait and mobility: Secondary | ICD-10-CM | POA: Diagnosis not present

## 2022-02-11 DIAGNOSIS — M6281 Muscle weakness (generalized): Secondary | ICD-10-CM | POA: Diagnosis not present

## 2022-02-12 DIAGNOSIS — R269 Unspecified abnormalities of gait and mobility: Secondary | ICD-10-CM | POA: Diagnosis not present

## 2022-02-12 DIAGNOSIS — E118 Type 2 diabetes mellitus with unspecified complications: Secondary | ICD-10-CM | POA: Diagnosis not present

## 2022-02-12 DIAGNOSIS — R2689 Other abnormalities of gait and mobility: Secondary | ICD-10-CM | POA: Diagnosis not present

## 2022-02-12 DIAGNOSIS — M6281 Muscle weakness (generalized): Secondary | ICD-10-CM | POA: Diagnosis not present

## 2022-02-13 DIAGNOSIS — E118 Type 2 diabetes mellitus with unspecified complications: Secondary | ICD-10-CM | POA: Diagnosis not present

## 2022-02-13 DIAGNOSIS — M6281 Muscle weakness (generalized): Secondary | ICD-10-CM | POA: Diagnosis not present

## 2022-02-13 DIAGNOSIS — R269 Unspecified abnormalities of gait and mobility: Secondary | ICD-10-CM | POA: Diagnosis not present

## 2022-02-13 DIAGNOSIS — R2689 Other abnormalities of gait and mobility: Secondary | ICD-10-CM | POA: Diagnosis not present

## 2022-02-14 DIAGNOSIS — R2689 Other abnormalities of gait and mobility: Secondary | ICD-10-CM | POA: Diagnosis not present

## 2022-02-14 DIAGNOSIS — R269 Unspecified abnormalities of gait and mobility: Secondary | ICD-10-CM | POA: Diagnosis not present

## 2022-02-14 DIAGNOSIS — M6281 Muscle weakness (generalized): Secondary | ICD-10-CM | POA: Diagnosis not present

## 2022-02-14 DIAGNOSIS — E118 Type 2 diabetes mellitus with unspecified complications: Secondary | ICD-10-CM | POA: Diagnosis not present

## 2022-02-15 DIAGNOSIS — M6281 Muscle weakness (generalized): Secondary | ICD-10-CM | POA: Diagnosis not present

## 2022-02-15 DIAGNOSIS — E118 Type 2 diabetes mellitus with unspecified complications: Secondary | ICD-10-CM | POA: Diagnosis not present

## 2022-02-15 DIAGNOSIS — R2689 Other abnormalities of gait and mobility: Secondary | ICD-10-CM | POA: Diagnosis not present

## 2022-02-15 DIAGNOSIS — R269 Unspecified abnormalities of gait and mobility: Secondary | ICD-10-CM | POA: Diagnosis not present

## 2022-02-18 DIAGNOSIS — E118 Type 2 diabetes mellitus with unspecified complications: Secondary | ICD-10-CM | POA: Diagnosis not present

## 2022-02-18 DIAGNOSIS — R269 Unspecified abnormalities of gait and mobility: Secondary | ICD-10-CM | POA: Diagnosis not present

## 2022-02-18 DIAGNOSIS — R2689 Other abnormalities of gait and mobility: Secondary | ICD-10-CM | POA: Diagnosis not present

## 2022-02-18 DIAGNOSIS — M6281 Muscle weakness (generalized): Secondary | ICD-10-CM | POA: Diagnosis not present

## 2022-02-19 DIAGNOSIS — I10 Essential (primary) hypertension: Secondary | ICD-10-CM | POA: Diagnosis not present

## 2022-02-19 DIAGNOSIS — R609 Edema, unspecified: Secondary | ICD-10-CM | POA: Diagnosis not present

## 2022-02-19 DIAGNOSIS — E118 Type 2 diabetes mellitus with unspecified complications: Secondary | ICD-10-CM | POA: Diagnosis not present

## 2022-03-05 ENCOUNTER — Telehealth: Payer: Self-pay | Admitting: "Endocrinology

## 2022-03-05 DIAGNOSIS — F32A Depression, unspecified: Secondary | ICD-10-CM | POA: Diagnosis not present

## 2022-03-05 DIAGNOSIS — E785 Hyperlipidemia, unspecified: Secondary | ICD-10-CM | POA: Diagnosis not present

## 2022-03-05 DIAGNOSIS — E039 Hypothyroidism, unspecified: Secondary | ICD-10-CM | POA: Diagnosis not present

## 2022-03-05 DIAGNOSIS — I10 Essential (primary) hypertension: Secondary | ICD-10-CM | POA: Diagnosis not present

## 2022-03-05 DIAGNOSIS — Z89512 Acquired absence of left leg below knee: Secondary | ICD-10-CM | POA: Diagnosis not present

## 2022-03-05 DIAGNOSIS — G8929 Other chronic pain: Secondary | ICD-10-CM | POA: Diagnosis not present

## 2022-03-05 DIAGNOSIS — E119 Type 2 diabetes mellitus without complications: Secondary | ICD-10-CM | POA: Diagnosis not present

## 2022-03-05 DIAGNOSIS — Z89511 Acquired absence of right leg below knee: Secondary | ICD-10-CM | POA: Diagnosis not present

## 2022-03-05 NOTE — Telephone Encounter (Signed)
Pt is currently residing at [facility]. She asked if we could send in her sensors to the DME company.

## 2022-03-06 NOTE — Telephone Encounter (Signed)
Tried to call pt but did not receive an answer and was unable to leave a message. 

## 2022-03-07 ENCOUNTER — Other Ambulatory Visit: Payer: Self-pay | Admitting: *Deleted

## 2022-03-07 DIAGNOSIS — E118 Type 2 diabetes mellitus with unspecified complications: Secondary | ICD-10-CM | POA: Diagnosis not present

## 2022-03-07 DIAGNOSIS — I10 Essential (primary) hypertension: Secondary | ICD-10-CM | POA: Diagnosis not present

## 2022-03-07 DIAGNOSIS — E039 Hypothyroidism, unspecified: Secondary | ICD-10-CM | POA: Diagnosis not present

## 2022-03-07 LAB — HEPATIC FUNCTION PANEL
ALT: 14 U/L (ref 7–35)
AST: 18 (ref 13–35)
Alkaline Phosphatase: 114 (ref 25–125)
Bilirubin, Total: 0.3

## 2022-03-07 LAB — LIPID PANEL
Cholesterol: 152 (ref 0–200)
HDL: 36 (ref 35–70)
LDL Cholesterol: 83
Triglycerides: 163 — AB (ref 40–160)

## 2022-03-07 LAB — BASIC METABOLIC PANEL
BUN: 21 (ref 4–21)
CO2: 27 — AB (ref 13–22)
Chloride: 105 (ref 99–108)
Creatinine: 1.1 (ref 0.5–1.1)
Glucose: 206
Potassium: 4.5 mEq/L (ref 3.5–5.1)
Sodium: 141 (ref 137–147)

## 2022-03-07 LAB — TSH: TSH: 2.27 (ref 0.41–5.90)

## 2022-03-07 LAB — COMPREHENSIVE METABOLIC PANEL
Albumin: 2.9 — AB (ref 3.5–5.0)
Calcium: 8.6 — AB (ref 8.7–10.7)
Globulin: 2.7

## 2022-03-07 NOTE — Patient Outreach (Signed)
  Care Coordination   Initial Visit Note   03/07/2022 Name: Michelle Skinner MRN: [id number] DOB: [birth year]  Michelle Skinner is a 56 y.o. year old female who sees Monico Blitz, MD for primary care. Orson Aloe at [facility]  What matters to the patients health and wellness today?  Noted that member was currently residing at [facility]. Call placed to confirm, she is there for long term care.       SDOH assessments and interventions completed:  No     Care Coordination Interventions Activated:  No  Care Coordination Interventions:  No, not indicated   Follow up plan: No further intervention required.   Encounter Outcome:  Pt. Visit Completed   Valente David, RN, MSN, [facility] Care Management Care Management Coordinator [phone]

## 2022-03-07 NOTE — Telephone Encounter (Signed)
Tried to call pt but did not receive an answer and was unable to leave a message due to her voicemail being full.

## 2022-03-19 ENCOUNTER — Encounter: Payer: Self-pay | Admitting: "Endocrinology

## 2022-03-19 ENCOUNTER — Ambulatory Visit (INDEPENDENT_AMBULATORY_CARE_PROVIDER_SITE_OTHER): Payer: Medicare Other | Admitting: "Endocrinology

## 2022-03-19 VITALS — BP 140/82 | HR 64

## 2022-03-19 DIAGNOSIS — E782 Mixed hyperlipidemia: Secondary | ICD-10-CM

## 2022-03-19 DIAGNOSIS — E559 Vitamin D deficiency, unspecified: Secondary | ICD-10-CM | POA: Diagnosis not present

## 2022-03-19 DIAGNOSIS — E1159 Type 2 diabetes mellitus with other circulatory complications: Secondary | ICD-10-CM | POA: Diagnosis not present

## 2022-03-19 DIAGNOSIS — E039 Hypothyroidism, unspecified: Secondary | ICD-10-CM | POA: Diagnosis not present

## 2022-03-19 DIAGNOSIS — I10 Essential (primary) hypertension: Secondary | ICD-10-CM

## 2022-03-19 LAB — POCT GLYCOSYLATED HEMOGLOBIN (HGB A1C): HbA1c, POC (controlled diabetic range): 8.6 % — AB (ref 0.0–7.0)

## 2022-03-19 MED ORDER — FREESTYLE LIBRE 2 SENSOR MISC: 1.0000 | 3 refills | Status: DC

## 2022-03-19 MED ORDER — INSULIN GLARGINE 100 UNIT/ML ~~LOC~~ SOLN: 40.0000 [IU] | Freq: Every day | SUBCUTANEOUS | 2 refills | Status: DC

## 2022-03-19 NOTE — Progress Notes (Signed)
03/19/2022  Endocrinology follow-up note  Subjective:    Patient ID: Michelle Skinner, female    DOB: 06/30/1966,    Past Medical History:  Diagnosis Date   Amputee, above knee, left ([place])    Amputee, above knee, right ([place])    Anemia    Cervical cancer ([place])    Depression    Diabetes mellitus, type II ([place])    GERD (gastroesophageal reflux disease)    Hyperlipidemia    Neuropathy    PTSD (post-traumatic stress disorder)    Past Surgical History:  Procedure Laterality Date   ABDOMINAL HYSTERECTOMY     AMPUTATION Bilateral 09/13/2020   Procedure: BILATERAL BELOW KNEE AMPUTATIONS;  Surgeon: [name], MD;  Location: [facility];  Service: Orthopedics;  Laterality: Bilateral;   COLONOSCOPY WITH PROPOFOL N/A 05/25/2018   Procedure: COLONOSCOPY WITH PROPOFOL;  Surgeon: Daneil Dolin, MD;  [facility];  Service: Endoscopy;  Laterality: N/A;  7:30am   I & D EXTREMITY Bilateral 09/10/2020   Procedure: IRRIGATION AND DEBRIDEMENT FEET;;  Surgeon: [name], MD;  Location: [facility];  Service: Orthopedics;  Laterality: Bilateral;   IRRIGATION AND DEBRIDEMENT FOOT Bilateral 09/08/2020   Procedure: IRRIGATION AND DEBRIDEMENT FOOT;  Surgeon: Virl Cagey, MD;  Location: [facility];  Service: General;  Laterality: Bilateral;  left foot 11 x 10.5 x 1    OTHER SURGICAL HISTORY Right    R Foot- I&D   POLYPECTOMY  05/25/2018   Procedure: POLYPECTOMY;  Surgeon: Daneil Dolin, MD;  [facility];  Service: Endoscopy;;  colon   UMBILICAL HERNIA REPAIR  2007   Social History   Socioeconomic History   Marital status: Married    Spouse name: Not on file   Number of children: Not on file   Years of education: Not on file   Highest education level: Not on file  Occupational History   Not on file  Tobacco Use   Smoking status: Never   Smokeless tobacco: Never  Vaping Use   Vaping Use: Never used  Substance and Sexual Activity   Alcohol use: No    Alcohol/week: 0.0  standard drinks of alcohol   Drug use: No   Sexual activity: Yes    Birth control/protection: Surgical  Other Topics Concern   Not on file  Social History Narrative   Not on file   Social Determinants of Health   Financial Resource Strain: Not on file  Food Insecurity: Not on file  Transportation Needs: Not on file  Physical Activity: Not on file  Stress: Not on file  Social Connections: Not on file   Outpatient Encounter Medications as of 03/19/2022  Medication Sig   Ascorbic Acid (VITAMIN C PO) Take 1 tablet by mouth at bedtime.   atorvastatin (LIPITOR) 40 MG tablet Take 1 tablet (40 mg total) by mouth at bedtime.   Calcium Carbonate (CALCIUM 500 PO) Take 1 tablet by mouth daily.   carvedilol (COREG) 12.5 MG tablet Take 1 tablet (12.5 mg total) by mouth 2 (two) times daily.   Cholecalciferol (VITAMIN D3) 125 MCG (5000 UT) CAPS Take 1 capsule (5,000 Units total) by mouth daily.   Continuous Blood Gluc Receiver (FREESTYLE LIBRE 2 READER) DEVI As directed   Continuous Blood Gluc Sensor (FREESTYLE LIBRE 2 SENSOR) MISC 1 Piece by Does not apply route every 14 (fourteen) days.   enoxaparin (LOVENOX) 80 MG/0.8ML injection Inject 0.7 mLs (70 mg total) into the skin daily.   Ferrous  Sulfate (IRON PO) Take 1 tablet by mouth at bedtime.   Ferrous Sulfate (IRON) 325 (65 Fe) MG TABS Take 1 tablet by mouth at bedtime.   FLUoxetine (PROZAC) 40 MG capsule Take 40 mg by mouth at bedtime.   folic acid (FOLVITE) 1 MG tablet Take 1 tablet (1 mg total) by mouth daily.   gabapentin (NEURONTIN) 300 MG capsule Take 1 capsule (300 mg total) by mouth 3 (three) times daily. (Patient not taking: Reported on 07/26/2021)   insulin glargine (LANTUS) 100 UNIT/ML injection Inject 0.4 mLs (40 Units total) into the skin at bedtime.   insulin lispro (HUMALOG) 100 UNIT/ML KwikPen Inject 10-16 Units into the skin 3 (three) times daily before meals.   levothyroxine (SYNTHROID) 150 MCG tablet Take 1 tablet (150 mcg  total) by mouth daily before breakfast.   lisinopril (ZESTRIL) 20 MG tablet Take 20 mg by mouth at bedtime.   magnesium citrate SOLN Take 296 mLs (1 Bottle total) by mouth daily as needed for severe constipation.   Multiple Vitamins-Minerals (PRESERVISION AREDS 2) CAPS Take 1 capsule by mouth 2 (two) times daily.   neomycin-bacitracin-polymyxin (NEOSPORIN) ointment Apply 1 application topically every 12 (twelve) hours. To legs   nutrition supplement, JUVEN, (JUVEN) PACK Take 1 packet by mouth 2 (two) times daily between meals.   ondansetron (ZOFRAN) 4 MG tablet Take 4 mg by mouth every 8 (eight) hours as needed for nausea or vomiting.   Oxycodone HCl 10 MG TABS Take 10 mg by mouth every 6 (six) hours as needed.   pantoprazole (PROTONIX) 40 MG tablet TAKE ONE (1) TABLET EACH DAY (Patient taking differently: Take 40 mg by mouth at bedtime.)   polyethylene glycol (MIRALAX / GLYCOLAX) 17 g packet Take 17 g by mouth 2 (two) times daily as needed for mild constipation.   promethazine (PHENERGAN) 25 MG suppository Place 1 suppository (25 mg total) rectally every 6 (six) hours as needed for nausea or vomiting.   senna-docusate (SENOKOT-S) 8.6-50 MG tablet Take 1 tablet by mouth 2 (two) times daily as needed for moderate constipation.   Thiamine HCl (VITAMIN B-1 PO) Take 1 tablet by mouth daily.   [DISCONTINUED] Continuous Blood Gluc Sensor (FREESTYLE LIBRE 2 SENSOR) MISC 1 Piece by Does not apply route every 14 (fourteen) days.   [DISCONTINUED] insulin glargine (LANTUS) 100 UNIT/ML injection Inject 0.3 mLs (30 Units total) into the skin at bedtime.   No facility-administered encounter medications on file as of 03/19/2022.   ALLERGIES: Allergies  Allergen Reactions   Sulfa Antibiotics Diarrhea, Nausea And Vomiting and Rash   Clindamycin/Lincomycin Diarrhea and Nausea And Vomiting   Invokana [Canagliflozin] Diarrhea and Nausea And Vomiting   Latex     Rash, itching, over a long period of time    Polyurethane [Urethane]    Tape Itching and Swelling    Reaction after a few days use/ please use paper tape   Bactrim [Sulfamethoxazole-Trimethoprim] Diarrhea, Nausea And Vomiting and Rash   Cherry Itching, Swelling and Rash    Throat swelling   Gabapentin Diarrhea, Nausea And Vomiting and Rash   Other Itching, Swelling and Rash    Reaction to Hot peppers (throat swelling)   VACCINATION STATUS: Immunization History  Administered Date(s) Administered   Influenza Split 08/15/2014   Influenza,inj,Quad PF,6+ Mos 06/10/2017, 04/30/2020   Tdap 09/02/2014    Diabetes She presents for her follow-up diabetic visit. She has type 2 diabetes mellitus. Onset time: She was diagnosed at approximate age of 78 years. 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 There are no hypoglycemic associated symptoms. Pertinent negatives for hypoglycemia include no confusion, headaches, pallor or seizures. Pertinent negatives for diabetes include no chest pain, no fatigue, no polydipsia, no polyphagia and no polyuria. There are no hypoglycemic complications. Symptoms are worsening. Diabetic complications include PVD and retinopathy. Pertinent negatives for diabetic complications include no autonomic neuropathy or peripheral neuropathy. (She has bilateral Charcot's feet, bilateral lower extremity lymphedema.) Risk factors for coronary artery disease include diabetes mellitus, dyslipidemia, hypertension, obesity and sedentary lifestyle. Current diabetic treatment includes intensive insulin program. She is compliant with treatment most of the time. Her weight is increasing steadily. She is following a generally unhealthy diet. She has not had a previous visit with a dietitian (She missed her appointment.). She never participates in exercise.  Her home blood glucose trend is increasing steadily. Her breakfast blood glucose range is generally >200 mg/dl. Her lunch blood glucose range is generally >200 mg/dl. Her dinner blood glucose range is generally >200 mg/dl. Her bedtime blood glucose range is generally >200 mg/dl. Her overall blood glucose range is >200 mg/dl. (She returns from her nursing home accompanied by one of her aids.  CGM analysis shows 29% time in range, 49% level 1 hyperglycemia, 22% level 2 hyperglycemia.  Her point-of-care A1c is 8.6% increasing from 7.7%.  For the last 14 years her average blood glucose is 210 mg per DL.  She did not document any hypoglycemia.     ) An ACE inhibitor/angiotensin II receptor blocker is not being taken. Eye exam is current.  Hyperlipidemia This is a chronic problem. The current episode started more than 1 year ago. The problem is uncontrolled. Recent lipid tests were reviewed and are high. Exacerbating diseases include diabetes, hypothyroidism and obesity. Pertinent negatives include no chest pain, myalgias or shortness of breath. She is currently on no antihyperlipidemic treatment. Risk factors for coronary artery disease include dyslipidemia, diabetes mellitus, obesity, a sedentary lifestyle, post-menopausal and hypertension.  Thyroid Problem Presents for initial visit. Patient reports no cold intolerance, diarrhea, fatigue, heat intolerance or palpitations. Past treatments include levothyroxine. The following procedures have not been performed: radioiodine uptake scan and thyroidectomy. Her past medical history is significant for diabetes and hyperlipidemia.    Review of systems   Objective:    BP (!) 140/82   Pulse 64   Wt Readings from Last 3 Encounters:  03/29/21 (!) 333 lb (151 kg)  12/17/20 (!) 326 lb 1 oz (147.9 kg)  10/09/20 (!) 326 lb (147.9 kg)     Physical Exam- Limited  Constitutional: On recliner.  She is status post bilateral below-knee amputation.   Psych:  Reluctant affect.     Latest Ref Rng & Units 03/07/2022   12:00 AM 03/28/2021   12:00 AM 12/17/2020   11:31 PM  CMP  Glucose 70 - 99 mg/dL   150   BUN 4 - '21 21     22     '$ 38   Creatinine 0.5 - 1.1 1.1     1.0     1.19   Sodium 137 - 147 141      133   Potassium 3.5 - 5.1 mEq/L 4.5      3.5   Chloride 99 - 108 105      97   CO2 13 - 22 27  25   Calcium 8.7 - 10.7 8.6      9.1   Total Protein 6.5 - 8.1 g/dL   6.9   Total Bilirubin 0.3 - 1.2 mg/dL   1.1   Alkaline Phos 25 - 125 114      93   AST 13 - 35 18      15   ALT 7 - 35 U/L 14      18      This result is from an external source.       Diabetic Labs (most recent): Lab Results  Component Value Date   HGBA1C 8.6 (A) 03/19/2022   HGBA1C 7.7 (A) 07/26/2021   HGBA1C 6.5 03/29/2021   MICROALBUR 114.5 10/20/2019   MICROALBUR 126.9 11/18/2018   MICROALBUR 2.0 10/08/2017    Lipid Panel     Component Value Date/Time   CHOL 152 03/07/2022 0000   TRIG 163 (A) 03/07/2022 0000   HDL 36 03/07/2022 0000   CHOLHDL 4.5 07/03/2020 1001   VLDL 42 (H) 04/25/2015 1001   LDLCALC 83 03/07/2022 0000   LDLCALC 115 (H) 07/03/2020 1001    Assessment & Plan:   1. Uncontrolled type 2 diabetes mellitus with complication, with long-term current use of insulin ([place])   Her diabetes is complicated by bilateral peripheral neuropathy , lateral Charcot's feet status post bilateral below-knee amputation due to complicated cellulitis/osteomyelitis, history of noncompliance/nonadherence, and possible retinopathy.  - She has chronically uncontrolled symptomatic type 2 DM of approximately since age 65 years duration.  She returns from her nursing home accompanied by one of her aids.  CGM analysis shows 29% time in range, 49% level 1 hyperglycemia, 22% level 2 hyperglycemia.  Her point-of-care A1c is 8.6% increasing from 7.7%.  For the last 14 years her average blood glucose is 210 mg per DL.  She did not document any hypoglycemia.   -She  remains at extremely high risk for more acute and chronic complications of diabetes which include CAD, CVA, CKD, retinopathy, and neuropathy. These are all discussed in detail with the patient.   - I have counseled the patient on diet management and weight loss, by adopting a carbohydrate restricted/protein rich diet.  - Patient is advised to stick to a routine mealtimes to eat 3 meals  a day and avoid unnecessary snacks ( to snack only to correct hypoglycemia).   - she acknowledges that there is a room for improvement in her food and drink choices. - Suggestion is made for her to avoid simple carbohydrates  from her diet including Cakes, Sweet Desserts, Ice Cream, Soda (diet and regular), Sweet Tea, Candies, Chips, Cookies, Store Bought Juices, Alcohol , Artificial Sweeteners,  Coffee Creamer, and "Sugar-free" Products, Lemonade. This will help patient to have more stable blood glucose profile and potentially avoid unintended weight gain.  The following Lifestyle Medicine recommendations according to [facility]) were discussed and and offered to patient and she  agrees to start the journey:  A. Whole Foods, Plant-Based Nutrition comprising of fruits and vegetables, plant-based proteins, whole-grain carbohydrates was discussed in detail with the patient.   A list for source of those nutrients were also provided to the patient.  Patient will use only water or unsweetened tea for hydration. B.  The need to stay away from risky substances including alcohol, smoking; obtaining 7 to 9 hours of restorative sleep, at least 150 minutes of moderate intensity exercise weekly, the importance of healthy social connections,  and stress management techniques were discussed. C.  A full color page of  Calorie density of various food groups per pound showing examples of each food groups was provided to the patient.   - I encouraged the patient to switch to  unprocessed or minimally  processed complex starch and increased protein intake (animal or plant source), fruits, and vegetables.   - I have approached patient with the following individualized plan to manage diabetes and patient agrees:   -She is now using a freestyle libre device.  She will continue to need intensive treatment in order for her to achieve and maintain control of diabetes to target.   -I recommended increasing her Lantus to 40 units nightly, increase of HumaLog to 10 units 3 times daily AC for Premeal blood glucose readings above 90 mg per DL. - She is warned not to take insulin without monitoring blood glucose properly.   -Patient is encouraged to call clinic for blood glucose levels less than 70 or above 300 mg /dl. - she is adamant saying that she does not tolerate MTF, SGLT2 inhibitors,  And GLP-1 receptor agonist including [place].  Interpretation of CGM printouts discussed with her. - Patient specific target  A1c;  LDL, HDL, Triglycerides were discussed in detail.  2) BP/HTN:  -Her blood pressure is not controlled.   She is advised to take her blood pressure medications in the morning .  Advised to continue lisinopril 20 mg daily, carvedilol 25 mg p.o. twice daily, advised on salt restrictions.     3) Lipids/HPL: Most recent lipid panel showed LDL proving to 83.   She is advised to continue Lipitor 40 mg p.o. nightly.    Side effects and precautions discussed with her.       4) vitamin D deficiency:  -She is advised to continue vitamin D2 50,000 units weekly in addition to her regular maintenance dose of vitamin D3 5000 units daily.     5)  Weight/Diet:   She is a candidate for modest weight loss.  She cannot exercise optimally.  Whole Foods plant-based diet will help however achieve reasonable weight loss.  6) hypothyroidism:  -Her recent thyroid function tests are consistent with appropriate replacement.  She is advised to continue levothyroxine 150 mcg p.o. daily before breakfast.    -  We discussed about the correct intake of her thyroid hormone, on empty stomach at fasting, with water, separated by at least 30 minutes from breakfast and other medications,  and separated by more than 4 hours from calcium, iron, multivitamins, acid reflux medications (PPIs). -Patient is made aware of the fact that thyroid hormone replacement is needed for life, dose to be adjusted by periodic monitoring of thyroid function tests.    6) Chronic Care/Health Maintenance:  -Patient is advised to continue lisinopril, Lipitor. She is encouraged to continue to follow up with Ophthalmology, Podiatrist at least yearly or according to recommendations, and advised to stay away from smoking. I have recommended yearly flu vaccine and pneumonia vaccination at least every 5 years; moderate intensity exercise for up to 150 minutes weekly; and  sleep for at least 7 hours a day.   I advised patient to maintain close follow up with their PCP for primary care needs.  I spent 41 minutes in the care of the patient today including review of labs from [facility], Lipids, Thyroid Function, Hematology (current and previous including abstractions from other facilities); face-to-face time discussing  her blood glucose readings/logs, discussing hypoglycemia and hyperglycemia episodes  and symptoms, medications doses, her options of short and long term treatment based on the latest standards of care / guidelines;  discussion about incorporating lifestyle medicine;  and documenting the encounter. Risk reduction counseling performed per USPSTF guidelines to reduce  obesity and cardiovascular risk factors.     Please refer to Patient Instructions for Blood Glucose Monitoring and Insulin/Medications Dosing Guide"  in media tab for additional information. Please  also refer to " Patient Self Inventory" in the Media  tab for reviewed elements of pertinent patient history.  Michelle Skinner participated in the discussions, expressed  understanding, and voiced agreement with the above plans.  All questions were answered to her satisfaction. she is encouraged to contact clinic should she have any questions or concerns prior to her return visit.   Follow up plan: Return in about 3 months (around 06/18/2022) for Bring Meter/CGM Device/Logs- A1c in Office.  Glade Lloyd, MD Phone: [phone]  Fax: [phone]  -  This note was partially dictated with voice recognition software. Similar sounding words can be transcribed inadequately or may not  be corrected upon review.  03/19/2022, 3:59 PM

## 2022-03-19 NOTE — Patient Instructions (Signed)

## 2022-03-22 DIAGNOSIS — I10 Essential (primary) hypertension: Secondary | ICD-10-CM | POA: Diagnosis not present

## 2022-03-22 DIAGNOSIS — E118 Type 2 diabetes mellitus with unspecified complications: Secondary | ICD-10-CM | POA: Diagnosis not present

## 2022-04-15 DIAGNOSIS — G894 Chronic pain syndrome: Secondary | ICD-10-CM | POA: Diagnosis not present

## 2022-04-16 DIAGNOSIS — I10 Essential (primary) hypertension: Secondary | ICD-10-CM | POA: Diagnosis not present

## 2022-04-16 DIAGNOSIS — E118 Type 2 diabetes mellitus with unspecified complications: Secondary | ICD-10-CM | POA: Diagnosis not present

## 2022-04-18 ENCOUNTER — Telehealth: Payer: Self-pay | Admitting: "Endocrinology

## 2022-04-18 DIAGNOSIS — E1159 Type 2 diabetes mellitus with other circulatory complications: Secondary | ICD-10-CM

## 2022-04-18 MED ORDER — FREESTYLE LIBRE 2 SENSOR MISC: 1.0000 | 2 refills | Status: DC

## 2022-04-18 NOTE — Telephone Encounter (Signed)
New message   The nursing home lost her sensor - it has been over two week now .  1. Which medications need to be refilled? (please list name of each medication and dose if known) Continuous Blood Gluc Sensor (FREESTYLE LIBRE 2 SENSOR) MISC  2. Which pharmacy/location (including street and city if local pharmacy) is medication to be sent to? [place]    3. Do they need a 30 day or 90 day supply? 30 day supply

## 2022-04-18 NOTE — Telephone Encounter (Signed)
Rx refill sent to [facility]. 

## 2022-05-01 DIAGNOSIS — H2513 Age-related nuclear cataract, bilateral: Secondary | ICD-10-CM | POA: Diagnosis not present

## 2022-05-01 DIAGNOSIS — H4311 Vitreous hemorrhage, right eye: Secondary | ICD-10-CM | POA: Diagnosis not present

## 2022-05-01 DIAGNOSIS — E113512 Type 2 diabetes mellitus with proliferative diabetic retinopathy with macular edema, left eye: Secondary | ICD-10-CM | POA: Diagnosis not present

## 2022-05-01 DIAGNOSIS — E113511 Type 2 diabetes mellitus with proliferative diabetic retinopathy with macular edema, right eye: Secondary | ICD-10-CM | POA: Diagnosis not present

## 2022-05-02 DIAGNOSIS — R2689 Other abnormalities of gait and mobility: Secondary | ICD-10-CM | POA: Diagnosis not present

## 2022-05-02 DIAGNOSIS — M6281 Muscle weakness (generalized): Secondary | ICD-10-CM | POA: Diagnosis not present

## 2022-05-02 DIAGNOSIS — E118 Type 2 diabetes mellitus with unspecified complications: Secondary | ICD-10-CM | POA: Diagnosis not present

## 2022-05-03 DIAGNOSIS — Z89512 Acquired absence of left leg below knee: Secondary | ICD-10-CM | POA: Diagnosis not present

## 2022-05-03 DIAGNOSIS — E785 Hyperlipidemia, unspecified: Secondary | ICD-10-CM | POA: Diagnosis not present

## 2022-05-03 DIAGNOSIS — Z760 Encounter for issue of repeat prescription: Secondary | ICD-10-CM | POA: Diagnosis not present

## 2022-05-03 DIAGNOSIS — Z79899 Other long term (current) drug therapy: Secondary | ICD-10-CM | POA: Diagnosis not present

## 2022-05-03 DIAGNOSIS — R52 Pain, unspecified: Secondary | ICD-10-CM | POA: Diagnosis not present

## 2022-05-03 DIAGNOSIS — I10 Essential (primary) hypertension: Secondary | ICD-10-CM | POA: Diagnosis not present

## 2022-05-03 DIAGNOSIS — E119 Type 2 diabetes mellitus without complications: Secondary | ICD-10-CM | POA: Diagnosis not present

## 2022-05-03 DIAGNOSIS — Z89511 Acquired absence of right leg below knee: Secondary | ICD-10-CM | POA: Diagnosis not present

## 2022-05-10 DIAGNOSIS — E559 Vitamin D deficiency, unspecified: Secondary | ICD-10-CM | POA: Diagnosis not present

## 2022-05-10 DIAGNOSIS — I739 Peripheral vascular disease, unspecified: Secondary | ICD-10-CM | POA: Diagnosis not present

## 2022-05-10 DIAGNOSIS — Z79899 Other long term (current) drug therapy: Secondary | ICD-10-CM | POA: Diagnosis not present

## 2022-05-10 DIAGNOSIS — E119 Type 2 diabetes mellitus without complications: Secondary | ICD-10-CM | POA: Diagnosis not present

## 2022-05-10 DIAGNOSIS — E785 Hyperlipidemia, unspecified: Secondary | ICD-10-CM | POA: Diagnosis not present

## 2022-05-10 DIAGNOSIS — E039 Hypothyroidism, unspecified: Secondary | ICD-10-CM | POA: Diagnosis not present

## 2022-05-10 DIAGNOSIS — G894 Chronic pain syndrome: Secondary | ICD-10-CM | POA: Diagnosis not present

## 2022-05-10 DIAGNOSIS — K219 Gastro-esophageal reflux disease without esophagitis: Secondary | ICD-10-CM | POA: Diagnosis not present

## 2022-05-13 DIAGNOSIS — F32A Depression, unspecified: Secondary | ICD-10-CM | POA: Diagnosis not present

## 2022-05-17 DIAGNOSIS — E119 Type 2 diabetes mellitus without complications: Secondary | ICD-10-CM | POA: Diagnosis not present

## 2022-05-17 DIAGNOSIS — Z89512 Acquired absence of left leg below knee: Secondary | ICD-10-CM | POA: Diagnosis not present

## 2022-05-17 DIAGNOSIS — Z89511 Acquired absence of right leg below knee: Secondary | ICD-10-CM | POA: Diagnosis not present

## 2022-05-23 DIAGNOSIS — E118 Type 2 diabetes mellitus with unspecified complications: Secondary | ICD-10-CM | POA: Diagnosis not present

## 2022-05-23 DIAGNOSIS — I10 Essential (primary) hypertension: Secondary | ICD-10-CM | POA: Diagnosis not present

## 2022-06-05 DIAGNOSIS — E113511 Type 2 diabetes mellitus with proliferative diabetic retinopathy with macular edema, right eye: Secondary | ICD-10-CM | POA: Diagnosis not present

## 2022-06-10 DIAGNOSIS — E8770 Fluid overload, unspecified: Secondary | ICD-10-CM | POA: Diagnosis not present

## 2022-06-17 DIAGNOSIS — I10 Essential (primary) hypertension: Secondary | ICD-10-CM | POA: Diagnosis not present

## 2022-06-17 DIAGNOSIS — E118 Type 2 diabetes mellitus with unspecified complications: Secondary | ICD-10-CM | POA: Diagnosis not present

## 2022-06-17 DIAGNOSIS — I509 Heart failure, unspecified: Secondary | ICD-10-CM | POA: Diagnosis not present

## 2022-06-17 DIAGNOSIS — E8770 Fluid overload, unspecified: Secondary | ICD-10-CM | POA: Diagnosis not present

## 2022-06-18 ENCOUNTER — Ambulatory Visit (INDEPENDENT_AMBULATORY_CARE_PROVIDER_SITE_OTHER): Payer: Medicare Other | Admitting: "Endocrinology

## 2022-06-18 ENCOUNTER — Encounter: Payer: Self-pay | Admitting: "Endocrinology

## 2022-06-18 VITALS — BP 150/88 | HR 72

## 2022-06-18 DIAGNOSIS — I10 Essential (primary) hypertension: Secondary | ICD-10-CM

## 2022-06-18 DIAGNOSIS — E1159 Type 2 diabetes mellitus with other circulatory complications: Secondary | ICD-10-CM

## 2022-06-18 DIAGNOSIS — E039 Hypothyroidism, unspecified: Secondary | ICD-10-CM | POA: Diagnosis not present

## 2022-06-18 DIAGNOSIS — E782 Mixed hyperlipidemia: Secondary | ICD-10-CM | POA: Diagnosis not present

## 2022-06-18 LAB — POCT GLYCOSYLATED HEMOGLOBIN (HGB A1C): HbA1c, POC (controlled diabetic range): 8.1 % — AB (ref 0.0–7.0)

## 2022-06-18 MED ORDER — FREESTYLE LIBRE 2 SENSOR MISC: 1.0000 | 2 refills | Status: DC

## 2022-06-18 MED ORDER — OZEMPIC (0.25 OR 0.5 MG/DOSE) 2 MG/3ML ~~LOC~~ SOPN: 0.5000 mg | PEN_INJECTOR | SUBCUTANEOUS | 2 refills | Status: DC

## 2022-06-18 MED ORDER — INSULIN GLARGINE 100 UNIT/ML ~~LOC~~ SOLN: 50.0000 [IU] | Freq: Every day | SUBCUTANEOUS | 2 refills | Status: DC

## 2022-06-18 NOTE — Patient Instructions (Signed)

## 2022-06-18 NOTE — Progress Notes (Signed)
06/18/2022  Endocrinology follow-up note  Subjective:    Patient ID: Michelle Skinner, female    DOB: October 10, 1965,    Past Medical History:  Diagnosis Date   Amputee, above knee, left ([place])    Amputee, above knee, right ([place])    Anemia    Cervical cancer ([place])    Depression    Diabetes mellitus, type II ([place])    GERD (gastroesophageal reflux disease)    Hyperlipidemia    Neuropathy    PTSD (post-traumatic stress disorder)    Past Surgical History:  Procedure Laterality Date   ABDOMINAL HYSTERECTOMY     AMPUTATION Bilateral 09/13/2020   Procedure: BILATERAL BELOW KNEE AMPUTATIONS;  Surgeon: [name], MD;  Location: [place];  Service: Orthopedics;  Laterality: Bilateral;   COLONOSCOPY WITH PROPOFOL N/A 05/25/2018   Procedure: COLONOSCOPY WITH PROPOFOL;  Surgeon: Daneil Dolin, MD;  [facility];  Service: Endoscopy;  Laterality: N/A;  7:30am   I & D EXTREMITY Bilateral 09/10/2020   Procedure: IRRIGATION AND DEBRIDEMENT FEET;;  Surgeon: [name], MD;  Location: [place];  Service: Orthopedics;  Laterality: Bilateral;   IRRIGATION AND DEBRIDEMENT FOOT Bilateral 09/08/2020   Procedure: IRRIGATION AND DEBRIDEMENT FOOT;  Surgeon: Virl Cagey, MD;  Location: [facility];  Service: General;  Laterality: Bilateral;  left foot 11 x 10.5 x 1    OTHER SURGICAL HISTORY Right    R Foot- I&D   POLYPECTOMY  05/25/2018   Procedure: POLYPECTOMY;  Surgeon: Daneil Dolin, MD;  [facility];  Service: Endoscopy;;  colon   UMBILICAL HERNIA REPAIR  2007   Social History   Socioeconomic History   Marital status: Married    Spouse name: Not on file   Number of children: Not on file   Years of education: Not on file   Highest education level: Not on file  Occupational History   Not on file  Tobacco Use   Smoking status: Never   Smokeless tobacco: Never  Vaping Use   Vaping Use: Never used  Substance and Sexual Activity   Alcohol use: No    Alcohol/week: 0.0  standard drinks of alcohol   Drug use: No   Sexual activity: Yes    Birth control/protection: Surgical  Other Topics Concern   Not on file  Social History Narrative   Not on file   Social Determinants of Health   Financial Resource Strain: Not on file  Food Insecurity: Not on file  Transportation Needs: Not on file  Physical Activity: Not on file  Stress: Not on file  Social Connections: Not on file   Outpatient Encounter Medications as of 06/18/2022  Medication Sig   furosemide (LASIX) 40 MG tablet Take 40 mg by mouth 2 (two) times daily.   Semaglutide,0.25 or 0.'5MG'$ /DOS, (OZEMPIC, 0.25 OR 0.5 MG/DOSE,) 2 MG/3ML SOPN Inject 0.5 mg into the skin once a week.   Ascorbic Acid (VITAMIN C PO) Take 1 tablet by mouth at bedtime.   atorvastatin (LIPITOR) 40 MG tablet Take 1 tablet (40 mg total) by mouth at bedtime.   Calcium Carbonate (CALCIUM 500 PO) Take 1 tablet by mouth daily.   carvedilol (COREG) 12.5 MG tablet Take 1 tablet (12.5 mg total) by mouth 2 (two) times daily.   Cholecalciferol (VITAMIN D3) 125 MCG (5000 UT) CAPS Take 1 capsule (5,000 Units total) by mouth daily.   Continuous Blood Gluc Receiver (FREESTYLE LIBRE 2 READER) DEVI As directed   Continuous Blood  Gluc Sensor (FREESTYLE LIBRE 2 SENSOR) MISC 1 Piece by Does not apply route every 14 (fourteen) days.   enoxaparin (LOVENOX) 80 MG/0.8ML injection Inject 0.7 mLs (70 mg total) into the skin daily.   Ferrous Sulfate (IRON PO) Take 1 tablet by mouth at bedtime. (Patient not taking: Reported on 06/18/2022)   Ferrous Sulfate (IRON) 325 (65 Fe) MG TABS Take 1 tablet by mouth at bedtime. (Patient not taking: Reported on 06/18/2022)   FLUoxetine (PROZAC) 40 MG capsule Take 40 mg by mouth at bedtime.   folic acid (FOLVITE) 1 MG tablet Take 1 tablet (1 mg total) by mouth daily.   gabapentin (NEURONTIN) 300 MG capsule Take 1 capsule (300 mg total) by mouth 3 (three) times daily. (Patient not taking: Reported on 07/26/2021)   insulin  glargine (LANTUS) 100 UNIT/ML injection Inject 0.5 mLs (50 Units total) into the skin at bedtime.   insulin lispro (HUMALOG) 100 UNIT/ML KwikPen Inject 12-18 Units into the skin 3 (three) times daily before meals.   levothyroxine (SYNTHROID) 150 MCG tablet Take 1 tablet (150 mcg total) by mouth daily before breakfast.   lisinopril (ZESTRIL) 20 MG tablet Take 20 mg by mouth at bedtime.   magnesium citrate SOLN Take 296 mLs (1 Bottle total) by mouth daily as needed for severe constipation.   Multiple Vitamins-Minerals (PRESERVISION AREDS 2) CAPS Take 1 capsule by mouth 2 (two) times daily.   neomycin-bacitracin-polymyxin (NEOSPORIN) ointment Apply 1 application topically every 12 (twelve) hours. To legs   nutrition supplement, JUVEN, (JUVEN) PACK Take 1 packet by mouth 2 (two) times daily between meals.   ondansetron (ZOFRAN) 4 MG tablet Take 4 mg by mouth every 8 (eight) hours as needed for nausea or vomiting.   Oxycodone HCl 10 MG TABS Take 10 mg by mouth every 6 (six) hours as needed.   pantoprazole (PROTONIX) 40 MG tablet TAKE ONE (1) TABLET EACH DAY (Patient taking differently: Take 40 mg by mouth at bedtime.)   polyethylene glycol (MIRALAX / GLYCOLAX) 17 g packet Take 17 g by mouth 2 (two) times daily as needed for mild constipation.   promethazine (PHENERGAN) 25 MG suppository Place 1 suppository (25 mg total) rectally every 6 (six) hours as needed for nausea or vomiting.   senna-docusate (SENOKOT-S) 8.6-50 MG tablet Take 1 tablet by mouth 2 (two) times daily as needed for moderate constipation.   Thiamine HCl (VITAMIN B-1 PO) Take 1 tablet by mouth daily.   [DISCONTINUED] Continuous Blood Gluc Sensor (FREESTYLE LIBRE 2 SENSOR) MISC 1 Piece by Does not apply route every 14 (fourteen) days.   [DISCONTINUED] insulin glargine (LANTUS) 100 UNIT/ML injection Inject 0.4 mLs (40 Units total) into the skin at bedtime.   No facility-administered encounter medications on file as of 06/18/2022.    ALLERGIES: Allergies  Allergen Reactions   Sulfa Antibiotics Diarrhea, Nausea And Vomiting and Rash   Clindamycin/Lincomycin Diarrhea and Nausea And Vomiting   Invokana [Canagliflozin] Diarrhea and Nausea And Vomiting   Latex     Rash, itching, over a long period of time   Polyurethane [Urethane]     Blistering and swelling   Tape Itching and Swelling    Reaction after a few days use/ please use paper tape   Bactrim [Sulfamethoxazole-Trimethoprim] Diarrhea, Nausea And Vomiting and Rash   Cherry Itching, Swelling and Rash    Throat swelling   Gabapentin Diarrhea, Nausea And Vomiting and Rash   Other Itching, Swelling and Rash    Reaction to Hot peppers (throat swelling)  VACCINATION STATUS: Immunization History  Administered Date(s) Administered   Influenza Split 08/15/2014   Influenza,inj,Quad PF,6+ Mos 06/10/2017, 04/30/2020   Tdap 09/02/2014    Diabetes She presents for her follow-up diabetic visit. She has type 2 diabetes mellitus. Onset time: She was diagnosed at approximate age of 62 years. Her disease course has been fluctuating (In the interval, she was hospitalized for bilateral lower extremity cellulitis complicated by osteomyelitis which led to bilateral below-knee amputation, currently recovering in nursing home.). There are no hypoglycemic associated symptoms. Pertinent negatives for hypoglycemia include no confusion, headaches, pallor or seizures. Pertinent negatives for diabetes include no chest pain, no fatigue, no polydipsia, no polyphagia and no polyuria. There are no hypoglycemic complications. Symptoms are worsening. Diabetic complications include PVD and retinopathy. Pertinent negatives for diabetic complications include no autonomic neuropathy or peripheral neuropathy. (She has bilateral Charcot's feet, bilateral lower extremity lymphedema.) Risk factors for coronary artery disease include diabetes mellitus, dyslipidemia, hypertension, obesity and sedentary  lifestyle. Current diabetic treatment includes intensive insulin program. She is compliant with treatment most of the time. Her weight is fluctuating minimally. She is following a generally unhealthy diet. She has not had a previous visit with a dietitian (She missed her appointment.). She never participates in exercise. Her home blood glucose trend is fluctuating minimally. Her breakfast blood glucose range is generally >200 mg/dl. Her lunch blood glucose range is generally >200 mg/dl. Her dinner blood glucose range is generally >200 mg/dl. Her bedtime blood glucose range is generally >200 mg/dl. Her overall blood glucose range is >200 mg/dl. (She returns from her nursing home unaccompanied. CGM analysis shows 24% time in range, 39% level 1 hyperglycemia, 37% level 2 hyperglycemia.  Her point-of-care A1c is 8.1% improving from 8.6%.  For the last 14 years her average blood glucose is 226 mg per DL.  She did not document any hypoglycemia.     ) An ACE inhibitor/angiotensin II receptor blocker is not being taken. Eye exam is current.  Hyperlipidemia This is a chronic problem. The current episode started more than 1 year ago. The problem is uncontrolled. Recent lipid tests were reviewed and are high. Exacerbating diseases include diabetes, hypothyroidism and obesity. Pertinent negatives include no chest pain, myalgias or shortness of breath. She is currently on no antihyperlipidemic treatment. Risk factors for coronary artery disease include dyslipidemia, diabetes mellitus, obesity, a sedentary lifestyle, post-menopausal and hypertension.  Thyroid Problem Presents for initial visit. Patient reports no cold intolerance, diarrhea, fatigue, heat intolerance or palpitations. Past treatments include levothyroxine. The following procedures have not been performed: radioiodine uptake scan and thyroidectomy. Her past medical history is significant for diabetes and hyperlipidemia.    Review of  systems   Objective:    BP (!) 150/88   Pulse 72   Wt Readings from Last 3 Encounters:  03/29/21 (!) 333 lb (151 kg)  12/17/20 (!) 326 lb 1 oz (147.9 kg)  10/09/20 (!) 326 lb (147.9 kg)     Physical Exam- Limited  Constitutional: On recliner.  She is status post bilateral below-knee amputation.   Psych: Reluctant affect.     Latest Ref Rng & Units 03/07/2022   12:00 AM 03/28/2021   12:00 AM 12/17/2020   11:31 PM  CMP  Glucose 70 - 99 mg/dL   150   BUN 4 - '21 21     22     '$ 38   Creatinine 0.5 - 1.1 1.1     1.0     1.19   Sodium  137 - 147 141      133   Potassium 3.5 - 5.1 mEq/L 4.5      3.5   Chloride 99 - 108 105      97   CO2 13 - '22 27      25   '$ Calcium 8.7 - 10.7 8.6      9.1   Total Protein 6.5 - 8.1 g/dL   6.9   Total Bilirubin 0.3 - 1.2 mg/dL   1.1   Alkaline Phos 25 - 125 114      93   AST 13 - 35 18      15   ALT 7 - 35 U/L 14      18      This result is from an external source.       Diabetic Labs (most recent): Lab Results  Component Value Date   HGBA1C 8.1 (A) 06/18/2022   HGBA1C 8.6 (A) 03/19/2022   HGBA1C 7.7 (A) 07/26/2021   MICROALBUR 114.5 10/20/2019   MICROALBUR 126.9 11/18/2018   MICROALBUR 2.0 10/08/2017    Lipid Panel     Component Value Date/Time   CHOL 152 03/07/2022 0000   TRIG 163 (A) 03/07/2022 0000   HDL 36 03/07/2022 0000   CHOLHDL 4.5 07/03/2020 1001   VLDL 42 (H) 04/25/2015 1001   LDLCALC 83 03/07/2022 0000   LDLCALC 115 (H) 07/03/2020 1001    Assessment & Plan:   1. Uncontrolled type 2 diabetes mellitus with complication, with long-term current use of insulin ([place])   Her diabetes is complicated by bilateral peripheral neuropathy , lateral Charcot's feet status post bilateral below-knee amputation due to complicated cellulitis/osteomyelitis, history of noncompliance/nonadherence, and possible retinopathy.  - She has chronically uncontrolled symptomatic type 2 DM of approximately since age 50 years duration.  She  returns from her nursing home unaccompanied by  any of  her aides.  She returns from her nursing home unaccompanied. CGM analysis shows 24% time in range, 39% level 1 hyperglycemia, 37% level 2 hyperglycemia.  Her point-of-care A1c is 8.1% improving from 8.6%.  For the last 14 years her average blood glucose is 226 mg per DL.  She did not document any hypoglycemia.   -She remains at extremely high risk for more acute and chronic complications of diabetes which include CAD, CVA, CKD, retinopathy, and neuropathy. These are all discussed in detail with the patient.   - I have counseled the patient on diet management and weight loss, by adopting a carbohydrate restricted/protein rich diet.  - Patient is advised to stick to a routine mealtimes to eat 3 meals  a day and avoid unnecessary snacks ( to snack only to correct hypoglycemia).   - she acknowledges that there is a room for improvement in her food and drink choices. - Suggestion is made for her to avoid simple carbohydrates  from her diet including Cakes, Sweet Desserts, Ice Cream, Soda (diet and regular), Sweet Tea, Candies, Chips, Cookies, Store Bought Juices, Alcohol , Artificial Sweeteners,  Coffee Creamer, and "Sugar-free" Products, Lemonade. This will help patient to have more stable blood glucose profile and potentially avoid unintended weight gain.  The following Lifestyle Medicine recommendations according to [facility]) were discussed and and offered to patient and she  agrees to start the journey:  A. Whole Foods, Plant-Based Nutrition comprising of fruits and vegetables, plant-based proteins, whole-grain carbohydrates was discussed in detail with the patient.   A  list for source of those nutrients were also provided to the patient.  Patient will use only water or unsweetened tea for hydration. B.  The need to stay away from risky substances including alcohol, smoking; obtaining 7 to 9 hours of  restorative sleep, at least 150 minutes of moderate intensity exercise weekly, the importance of healthy social connections,  and stress management techniques were discussed. C.  A full color page of  Calorie density of various food groups per pound showing examples of each food groups was provided to the patient.    - I have approached patient with the following individualized plan to manage diabetes and patient agrees:   -She is now using a freestyle libre device.  She will continue to need intensive treatment in order for her to achieve and maintain control of diabetes to target.   -I recommended increasing her Lantus to 50 units nightly, increase of HumaLog to 12 units 3 times daily AC for Premeal blood glucose readings above 90 mg per DL. - She is warned not to take insulin without monitoring blood glucose properly.   -Patient is encouraged to call clinic for blood glucose levels less than 70 or above 300 mg /dl. - she is now open for GLP1 R agonists . I discussed and added Ozempic 0.'5mg'$  sq weekly. SE discussed with her.   - previously she was  adamant saying that she does not tolerate MTF, SGLT2 inhibitors,  And GLP-1 receptor agonist including [place].  - Patient specific target  A1c;  LDL, HDL, Triglycerides were discussed in detail.  2) BP/HTN:  Her BP is uncontrolled to target.   She is advised to take her blood pressure medications in the morning .  Advised to continue lisinopril 20 mg daily, carvedilol 25 mg p.o. twice daily, advised on salt restrictions.     3) Lipids/HPL: Most recent lipid panel showed LDL proving to 83.   She is advised to continue Lipitor 40 mg p.o. nightly.    Side effects and precautions discussed with her.       4) vitamin D deficiency:  -She is advised to continue vitamin D2 50,000 units weekly in addition to her regular maintenance dose of vitamin D3 5000 units daily.     5)  Weight/Diet:   She is a candidate for modest weight loss.  She cannot  exercise optimally.  Whole Foods plant-based diet will help her achieve reasonable weight loss.  6) hypothyroidism:  -Her recent thyroid function tests are consistent with appropriate replacement.  She is advised to continue levothyroxine 150 mcg p.o. daily before breakfast.    - We discussed about the correct intake of her thyroid hormone, on empty stomach at fasting, with water, separated by at least 30 minutes from breakfast and other medications,  and separated by more than 4 hours from calcium, iron, multivitamins, acid reflux medications (PPIs). -Patient is made aware of the fact that thyroid hormone replacement is needed for life, dose to be adjusted by periodic monitoring of thyroid function tests.  6) Chronic Care/Health Maintenance:  -Patient is advised to continue lisinopril, Lipitor. She is encouraged to continue to follow up with Ophthalmology, Podiatrist at least yearly or according to recommendations, and advised to stay away from smoking. I have recommended yearly flu vaccine and pneumonia vaccination at least every 5 years; moderate intensity exercise for up to 150 minutes weekly; and  sleep for at least 7 hours a day.   I advised patient to maintain close follow up  with their PCP for primary care needs.    I spent  41 minutes in the care of the patient today including review of labs from [place], Lipids, Thyroid Function, Hematology (current and previous including abstractions from other facilities); face-to-face time discussing  her blood glucose readings/logs, discussing hypoglycemia and hyperglycemia episodes and symptoms, medications doses, her options of short and long term treatment based on the latest standards of care / guidelines;  discussion about incorporating lifestyle medicine;  and documenting the encounter. Risk reduction counseling performed per USPSTF guidelines to reduce obesity and cardiovascular risk factors.     Please refer to Patient Instructions for Blood  Glucose Monitoring and Insulin/Medications Dosing Guide"  in media tab for additional information. Please  also refer to " Patient Self Inventory" in the Media  tab for reviewed elements of pertinent patient history.  Michelle Skinner participated in the discussions, expressed understanding, and voiced agreement with the above plans.  All questions were answered to her satisfaction. she is encouraged to contact clinic should she have any questions or concerns prior to her return visit.    Follow up plan: Return in about 3 months (around 09/17/2022) for F/U with Pre-visit Labs, Meter/CGM/Logs, A1c here.  Glade Lloyd, MD Phone: [phone]  Fax: [phone]  -  This note was partially dictated with voice recognition software. Similar sounding words can be transcribed inadequately or may not  be corrected upon review.  06/18/2022, 4:15 PM

## 2022-06-19 DIAGNOSIS — I509 Heart failure, unspecified: Secondary | ICD-10-CM | POA: Diagnosis not present

## 2022-06-24 DIAGNOSIS — U071 COVID-19: Secondary | ICD-10-CM | POA: Diagnosis not present

## 2022-06-26 DIAGNOSIS — U071 COVID-19: Secondary | ICD-10-CM | POA: Diagnosis not present

## 2022-06-27 ENCOUNTER — Ambulatory Visit: Payer: Medicare Other | Admitting: Orthopedic Surgery

## 2022-06-27 DIAGNOSIS — E118 Type 2 diabetes mellitus with unspecified complications: Secondary | ICD-10-CM | POA: Diagnosis not present

## 2022-06-27 DIAGNOSIS — I10 Essential (primary) hypertension: Secondary | ICD-10-CM | POA: Diagnosis not present

## 2022-06-28 DIAGNOSIS — U071 COVID-19: Secondary | ICD-10-CM | POA: Diagnosis not present

## 2022-07-08 DIAGNOSIS — E782 Mixed hyperlipidemia: Secondary | ICD-10-CM | POA: Diagnosis not present

## 2022-07-08 DIAGNOSIS — I509 Heart failure, unspecified: Secondary | ICD-10-CM | POA: Diagnosis not present

## 2022-07-08 DIAGNOSIS — K5909 Other constipation: Secondary | ICD-10-CM | POA: Diagnosis not present

## 2022-07-08 DIAGNOSIS — E119 Type 2 diabetes mellitus without complications: Secondary | ICD-10-CM | POA: Diagnosis not present

## 2022-07-08 DIAGNOSIS — I10 Essential (primary) hypertension: Secondary | ICD-10-CM | POA: Diagnosis not present

## 2022-07-08 DIAGNOSIS — I2510 Atherosclerotic heart disease of native coronary artery without angina pectoris: Secondary | ICD-10-CM | POA: Diagnosis not present

## 2022-07-09 DIAGNOSIS — E785 Hyperlipidemia, unspecified: Secondary | ICD-10-CM | POA: Diagnosis not present

## 2022-07-09 DIAGNOSIS — F32A Depression, unspecified: Secondary | ICD-10-CM | POA: Diagnosis not present

## 2022-07-09 DIAGNOSIS — Z89512 Acquired absence of left leg below knee: Secondary | ICD-10-CM | POA: Diagnosis not present

## 2022-07-09 DIAGNOSIS — E119 Type 2 diabetes mellitus without complications: Secondary | ICD-10-CM | POA: Diagnosis not present

## 2022-07-09 DIAGNOSIS — I509 Heart failure, unspecified: Secondary | ICD-10-CM | POA: Diagnosis not present

## 2022-07-09 DIAGNOSIS — I10 Essential (primary) hypertension: Secondary | ICD-10-CM | POA: Diagnosis not present

## 2022-07-09 DIAGNOSIS — Z89511 Acquired absence of right leg below knee: Secondary | ICD-10-CM | POA: Diagnosis not present

## 2022-07-09 DIAGNOSIS — E039 Hypothyroidism, unspecified: Secondary | ICD-10-CM | POA: Diagnosis not present

## 2022-07-12 DIAGNOSIS — E118 Type 2 diabetes mellitus with unspecified complications: Secondary | ICD-10-CM | POA: Diagnosis not present

## 2022-07-12 DIAGNOSIS — E039 Hypothyroidism, unspecified: Secondary | ICD-10-CM | POA: Diagnosis not present

## 2022-07-15 ENCOUNTER — Ambulatory Visit: Payer: Medicare Other | Admitting: Orthopedic Surgery

## 2022-07-15 ENCOUNTER — Ambulatory Visit (INDEPENDENT_AMBULATORY_CARE_PROVIDER_SITE_OTHER): Payer: 59 | Admitting: Orthopedic Surgery

## 2022-07-15 DIAGNOSIS — Z89512 Acquired absence of left leg below knee: Secondary | ICD-10-CM | POA: Diagnosis not present

## 2022-07-15 DIAGNOSIS — Z89511 Acquired absence of right leg below knee: Secondary | ICD-10-CM | POA: Diagnosis not present

## 2022-07-19 ENCOUNTER — Encounter: Payer: Self-pay | Admitting: Orthopedic Surgery

## 2022-07-19 DIAGNOSIS — H2513 Age-related nuclear cataract, bilateral: Secondary | ICD-10-CM | POA: Diagnosis not present

## 2022-07-19 DIAGNOSIS — E113511 Type 2 diabetes mellitus with proliferative diabetic retinopathy with macular edema, right eye: Secondary | ICD-10-CM | POA: Diagnosis not present

## 2022-07-19 DIAGNOSIS — H4311 Vitreous hemorrhage, right eye: Secondary | ICD-10-CM | POA: Diagnosis not present

## 2022-07-19 DIAGNOSIS — E113512 Type 2 diabetes mellitus with proliferative diabetic retinopathy with macular edema, left eye: Secondary | ICD-10-CM | POA: Diagnosis not present

## 2022-07-19 NOTE — Progress Notes (Signed)
Office Visit Note   Patient: Michelle Skinner           Date of Birth: 1966/03/28           MRN: [id number] Visit Date: 07/15/2022              Requested by: Monico Blitz, MD [address] 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  Patient states that she has an allergy to her silicone sleeve is having poor fit and has subsided into her socket.  Patient states she would also like to pursue a Hoveround.  Assessment & Plan: Visit Diagnoses:  1. S/P bilateral below knee amputation [facility])     Plan: Patient was provided a prescription for a Hoveround chair.  Follow-Up Instructions: Return in about 3 months (around 10/14/2022).   Ortho Exam  Patient is alert, oriented, no adenopathy, well-dressed, normal affect, normal respiratory effort. Patient currently cannot fit into her legs secondary to swelling.  There is no cellulitis no signs of infection no open ulcers.  Patient was provided prescription for new liners and sockets from [employer].  Patient is an existing bilateral transtibial  amputee.  Patient's current comorbidities are not expected to impact the ability to function with the prescribed prosthesis. Patient verbally communicates a strong desire to use a prosthesis. Patient currently requires mobility aids to ambulate without a prosthesis.  Expects not to use mobility aids with a new prosthesis.  Patient is a K2 level ambulator that will use a prosthesis to walk around their home and the community over low level environmental barriers.      Imaging: No results found. No images are attached to the encounter.  Labs: Lab Results  Component Value Date   HGBA1C 8.1 (A) 06/18/2022   HGBA1C 8.6 (A) 03/19/2022   HGBA1C 7.7 (A) 07/26/2021   REPTSTATUS  12/21/2020 FINAL 12/18/2020   GRAMSTAIN  09/10/2020    RARE WBC PRESENT, PREDOMINANTLY PMN ABUNDANT GRAM POSITIVE COCCI RARE GRAM NEGATIVE RODS    CULT >=100,000 COLONIES/mL ESCHERICHIA COLI (A) 12/18/2020   LABORGA ESCHERICHIA COLI (A) 12/18/2020     Lab Results  Component Value Date   ALBUMIN 2.9 (A) 03/07/2022   ALBUMIN 2.8 (L) 12/17/2020   ALBUMIN <1.0 (L) 09/17/2020   PREALBUMIN 11.1 (L) 09/16/2020    Lab Results  Component Value Date   MG 2.2 09/17/2020   MG 1.9 09/16/2020   MG 1.9 09/15/2020   Lab Results  Component Value Date   VD25OH 12 (L) 10/20/2019   VD25OH 22 (L) 04/01/2019   VD25OH 29 (L) 08/17/2018    Lab Results  Component Value Date   PREALBUMIN 11.1 (L) 09/16/2020      Latest Ref Rng & Units 12/17/2020   11:31 PM 09/28/2020    5:48 AM 09/17/2020    3:40 AM  CBC EXTENDED  WBC 4.0 - 10.5 K/uL 10.1  3.2  11.9   RBC 3.87 - 5.11 MIL/uL 4.04  2.68  2.81   Hemoglobin 12.0 - 15.0 g/dL 12.0  7.9  8.2   HCT 36.0 - 46.0 % 36.7  25.4  25.7   Platelets 150 - 400 K/uL 173  220  308   NEUT# 1.7 - 7.7  K/uL 9.0  2.3    Lymph# 0.7 - 4.0 K/uL 0.4  0.6       There is no height or weight on file to calculate BMI.  Orders:  No orders of the defined types were placed in this encounter.  No orders of the defined types were placed in this encounter.    Procedures: No procedures performed  Clinical Data: No additional findings.  ROS:  All other systems negative, except as noted in the HPI. Review of Systems  Objective: Vital Signs: There were no vitals taken for this visit.  Specialty Comments:  No specialty comments available.  PMFS History: Patient Active Problem List   Diagnosis Date Noted   Peripherally inserted central catheter (PICC) in place 10/05/2020   MSSA bacteremia    Acute hematogenous osteomyelitis of both feet ([place])    Severe protein-calorie malnutrition ([place])    BMI 50.0-59.9, adult ([place])    Plantar ulcer of right foot ([place])     Plantar ulcer of left foot ([place])    Lymphedema    Sepsis ([place]) 09/06/2020   Cellulitis 04/27/2020   AKI (acute kidney injury) ([place]) 04/27/2020   Stasis dermatitis 04/27/2020   Anxiety 03/31/2020   Depression 03/31/2020   GERD (gastroesophageal reflux disease) 03/31/2020   Posttraumatic stress disorder 03/31/2020   Personal history of noncompliance with medical treatment, presenting hazards to health 02/09/2018   Abnormal CT of the abdomen 10/20/2017   Dilated cbd, acquired 10/20/2017   Essential hypertension, benign 01/14/2017   Primary hypothyroidism 05/03/2015   Mixed hyperlipidemia 05/03/2015   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   Morbid obesity ([place]) 04/25/2015   Vitamin D deficiency 04/25/2015   Cervical cancer ([place]) 10/05/2012   Past Medical History:  Diagnosis Date   Amputee, above knee, left ([facility])    Amputee, above knee, right ([facility])    Anemia    Cervical cancer ([place])    Depression    Diabetes mellitus, type II ([place])    GERD (gastroesophageal reflux disease)    Hyperlipidemia    Neuropathy    PTSD (post-traumatic stress disorder)     Family History  Problem Relation Age of Onset   Hypertension Mother    Diabetes Father    Hyperlipidemia Father    CAD Father    Stroke Father    Osteoporosis Maternal Grandmother    Cancer Maternal Grandmother    Hypertension Maternal Grandmother    Colon cancer Neg Hx     Past Surgical History:  Procedure Laterality Date   ABDOMINAL HYSTERECTOMY     AMPUTATION Bilateral 09/13/2020   Procedure: BILATERAL BELOW KNEE AMPUTATIONS;  Surgeon: [name], MD;  Location: [facility];  Service: Orthopedics;  Laterality: Bilateral;   COLONOSCOPY WITH PROPOFOL N/A 05/25/2018   Procedure: COLONOSCOPY WITH PROPOFOL;  Surgeon: Daneil Dolin, MD;  [facility];  Service: Endoscopy;  Laterality: N/A;  7:30am   I & D EXTREMITY Bilateral 09/10/2020   Procedure: IRRIGATION AND DEBRIDEMENT FEET;;  Surgeon: [name], MD;  Location: [facility];  Service: Orthopedics;  Laterality: Bilateral;   IRRIGATION AND DEBRIDEMENT FOOT Bilateral 09/08/2020   Procedure: IRRIGATION AND DEBRIDEMENT FOOT;  Surgeon: Virl Cagey, MD;  Location: [facility];  Service: General;  Laterality: Bilateral;  left foot 11 x 10.5 x 1    OTHER SURGICAL HISTORY Right    R Foot- I&D   POLYPECTOMY  05/25/2018   Procedure: POLYPECTOMY;  Surgeon: Daneil Dolin, MD;  [facility];  Service: Endoscopy;;  colon   UMBILICAL HERNIA REPAIR  2007   Social History   Occupational History   Not on file  Tobacco Use   Smoking status: Never   Smokeless tobacco: Never  Vaping Use   Vaping Use: Never used  Substance and Sexual Activity   Alcohol use: No    Alcohol/week: 0.0 standard drinks of alcohol   Drug use: No   Sexual activity: Yes    Birth control/protection: Surgical

## 2022-07-26 DIAGNOSIS — I10 Essential (primary) hypertension: Secondary | ICD-10-CM | POA: Diagnosis not present

## 2022-07-26 DIAGNOSIS — E118 Type 2 diabetes mellitus with unspecified complications: Secondary | ICD-10-CM | POA: Diagnosis not present

## 2022-08-07 DIAGNOSIS — Z89612 Acquired absence of left leg above knee: Secondary | ICD-10-CM | POA: Diagnosis not present

## 2022-08-07 DIAGNOSIS — G894 Chronic pain syndrome: Secondary | ICD-10-CM | POA: Diagnosis not present

## 2022-08-21 ENCOUNTER — Telehealth: Payer: Self-pay | Admitting: Orthopedic Surgery

## 2022-08-21 NOTE — Telephone Encounter (Signed)
Pt called asking for another electric wheel chair script. Pt states the facility she stays at lost her script and she need a new one. Please call pt at [phone]. Please sen another copy of fax to [facility] in [place] phone number [phone].

## 2022-08-23 NOTE — Telephone Encounter (Signed)
Called pt, VM box is full. I did fax over order for hoveround wheelchair to [facility] at [phone]

## 2022-09-06 LAB — LAB REPORT - SCANNED: EGFR: 29

## 2022-09-17 ENCOUNTER — Encounter: Payer: Self-pay | Admitting: "Endocrinology

## 2022-09-17 ENCOUNTER — Ambulatory Visit (INDEPENDENT_AMBULATORY_CARE_PROVIDER_SITE_OTHER): Payer: 59 | Admitting: "Endocrinology

## 2022-09-17 VITALS — BP 124/76 | HR 72

## 2022-09-17 DIAGNOSIS — E782 Mixed hyperlipidemia: Secondary | ICD-10-CM

## 2022-09-17 DIAGNOSIS — I10 Essential (primary) hypertension: Secondary | ICD-10-CM

## 2022-09-17 DIAGNOSIS — E039 Hypothyroidism, unspecified: Secondary | ICD-10-CM | POA: Diagnosis not present

## 2022-09-17 DIAGNOSIS — E1159 Type 2 diabetes mellitus with other circulatory complications: Secondary | ICD-10-CM | POA: Diagnosis not present

## 2022-09-17 LAB — POCT GLYCOSYLATED HEMOGLOBIN (HGB A1C): HbA1c, POC (controlled diabetic range): 8.4 % — AB (ref 0.0–7.0)

## 2022-09-17 MED ORDER — INSULIN GLARGINE 100 UNIT/ML ~~LOC~~ SOLN: 60.0000 [IU] | Freq: Every day | SUBCUTANEOUS | 2 refills | Status: DC

## 2022-09-17 NOTE — Progress Notes (Signed)
09/17/2022  Endocrinology follow-up note  Subjective:    Patient ID: Michelle Skinner, female    DOB: Dec 05, 1965,    Past Medical History:  Diagnosis Date   Amputee, above knee, left ([place])    Amputee, above knee, right ([place])    Anemia    Cervical cancer ([place])    Depression    Diabetes mellitus, type II ([place])    GERD (gastroesophageal reflux disease)    Hyperlipidemia    Neuropathy    PTSD (post-traumatic stress disorder)    Past Surgical History:  Procedure Laterality Date   ABDOMINAL HYSTERECTOMY     AMPUTATION Bilateral 09/13/2020   Procedure: BILATERAL BELOW KNEE AMPUTATIONS;  Surgeon: [name], MD;  Location: [facility];  Service: Orthopedics;  Laterality: Bilateral;   COLONOSCOPY WITH PROPOFOL N/A 05/25/2018   Procedure: COLONOSCOPY WITH PROPOFOL;  Surgeon: Daneil Dolin, MD;  [facility];  Service: Endoscopy;  Laterality: N/A;  7:30am   I & D EXTREMITY Bilateral 09/10/2020   Procedure: IRRIGATION AND DEBRIDEMENT FEET;;  Surgeon: [name], MD;  Location: [facility];  Service: Orthopedics;  Laterality: Bilateral;   IRRIGATION AND DEBRIDEMENT FOOT Bilateral 09/08/2020   Procedure: IRRIGATION AND DEBRIDEMENT FOOT;  Surgeon: Virl Cagey, MD;  Location: [facility];  Service: General;  Laterality: Bilateral;  left foot 11 x 10.5 x 1    OTHER SURGICAL HISTORY Right    R Foot- I&D   POLYPECTOMY  05/25/2018   Procedure: POLYPECTOMY;  Surgeon: Daneil Dolin, MD;  [facility];  Service: Endoscopy;;  colon   UMBILICAL HERNIA REPAIR  2007   Social History   Socioeconomic History   Marital status: Married    Spouse name: Not on file   Number of children: Not on file   Years of education: Not on file   Highest education level: Not on file  Occupational History   Not on file  Tobacco Use   Smoking status: Never   Smokeless tobacco: Never  Vaping Use   Vaping Use: Never used  Substance and Sexual Activity   Alcohol use: No    Alcohol/week: 0.0  standard drinks of alcohol   Drug use: No   Sexual activity: Yes    Birth control/protection: Surgical  Other Topics Concern   Not on file  Social History Narrative   Not on file   Social Determinants of Health   Financial Resource Strain: Not on file  Food Insecurity: Not on file  Transportation Needs: Not on file  Physical Activity: Not on file  Stress: Not on file  Social Connections: Not on file   Outpatient Encounter Medications as of 09/17/2022  Medication Sig   Ascorbic Acid (VITAMIN C PO) Take 1 tablet by mouth at bedtime.   atorvastatin (LIPITOR) 40 MG tablet Take 1 tablet (40 mg total) by mouth at bedtime.   Calcium Carbonate (CALCIUM 500 PO) Take 1 tablet by mouth daily.   carvedilol (COREG) 12.5 MG tablet Take 1 tablet (12.5 mg total) by mouth 2 (two) times daily.   Cholecalciferol (VITAMIN D3) 125 MCG (5000 UT) CAPS Take 1 capsule (5,000 Units total) by mouth daily.   Continuous Blood Gluc Receiver (FREESTYLE LIBRE 2 READER) DEVI As directed   Continuous Blood Gluc Sensor (FREESTYLE LIBRE 2 SENSOR) MISC 1 Piece by Does not apply route every 14 (fourteen) days.   enoxaparin (LOVENOX) 80 MG/0.8ML injection Inject 0.7 mLs (70 mg total) into the skin daily.   Ferrous  Sulfate (IRON PO) Take 1 tablet by mouth at bedtime. (Patient not taking: Reported on 06/18/2022)   Ferrous Sulfate (IRON) 325 (65 Fe) MG TABS Take 1 tablet by mouth at bedtime. (Patient not taking: Reported on 06/18/2022)   FLUoxetine (PROZAC) 40 MG capsule Take 40 mg by mouth at bedtime.   folic acid (FOLVITE) 1 MG tablet Take 1 tablet (1 mg total) by mouth daily.   furosemide (LASIX) 40 MG tablet Take 40 mg by mouth 2 (two) times daily.   gabapentin (NEURONTIN) 300 MG capsule Take 1 capsule (300 mg total) by mouth 3 (three) times daily. (Patient not taking: Reported on 07/26/2021)   insulin glargine (LANTUS) 100 UNIT/ML injection Inject 0.6 mLs (60 Units total) into the skin at bedtime.   insulin lispro  (HUMALOG) 100 UNIT/ML KwikPen Inject 12-18 Units into the skin 3 (three) times daily before meals.   levothyroxine (SYNTHROID) 150 MCG tablet Take 1 tablet (150 mcg total) by mouth daily before breakfast.   lisinopril (ZESTRIL) 20 MG tablet Take 20 mg by mouth at bedtime.   magnesium citrate SOLN Take 296 mLs (1 Bottle total) by mouth daily as needed for severe constipation.   Multiple Vitamins-Minerals (PRESERVISION AREDS 2) CAPS Take 1 capsule by mouth 2 (two) times daily.   neomycin-bacitracin-polymyxin (NEOSPORIN) ointment Apply 1 application topically every 12 (twelve) hours. To legs   nutrition supplement, JUVEN, (JUVEN) PACK Take 1 packet by mouth 2 (two) times daily between meals.   ondansetron (ZOFRAN) 4 MG tablet Take 4 mg by mouth every 8 (eight) hours as needed for nausea or vomiting.   Oxycodone HCl 10 MG TABS Take 10 mg by mouth every 6 (six) hours as needed.   pantoprazole (PROTONIX) 40 MG tablet TAKE ONE (1) TABLET EACH DAY (Patient taking differently: Take 40 mg by mouth at bedtime.)   polyethylene glycol (MIRALAX / GLYCOLAX) 17 g packet Take 17 g by mouth 2 (two) times daily as needed for mild constipation.   promethazine (PHENERGAN) 25 MG suppository Place 1 suppository (25 mg total) rectally every 6 (six) hours as needed for nausea or vomiting.   senna-docusate (SENOKOT-S) 8.6-50 MG tablet Take 1 tablet by mouth 2 (two) times daily as needed for moderate constipation.   Thiamine HCl (VITAMIN B-1 PO) Take 1 tablet by mouth daily.   [DISCONTINUED] insulin glargine (LANTUS) 100 UNIT/ML injection Inject 0.5 mLs (50 Units total) into the skin at bedtime.   [DISCONTINUED] Semaglutide,0.25 or 0.'5MG'$ /DOS, (OZEMPIC, 0.25 OR 0.5 MG/DOSE,) 2 MG/3ML SOPN Inject 0.5 mg into the skin once a week. (Patient not taking: Reported on 09/17/2022)   No facility-administered encounter medications on file as of 09/17/2022.   ALLERGIES: Allergies  Allergen Reactions   Sulfa Antibiotics Diarrhea,  Nausea And Vomiting and Rash   Clindamycin/Lincomycin Diarrhea and Nausea And Vomiting   Invokana [Canagliflozin] Diarrhea and Nausea And Vomiting   Latex     Rash, itching, over a long period of time   Ozempic (0.25 Or 0.5 Mg-Dose) [Semaglutide(0.25 Or 0.'5mg'$ -Dos)] Diarrhea and Nausea And Vomiting   Polyurethane [Urethane]     Blistering and swelling   Tape Itching and Swelling    Reaction after a few days use/ please use paper tape   Bactrim [Sulfamethoxazole-Trimethoprim] Diarrhea, Nausea And Vomiting and Rash   Cherry Itching, Swelling and Rash    Throat swelling   Gabapentin Diarrhea, Nausea And Vomiting and Rash   Other Itching, Swelling and Rash    Reaction to Hot peppers (throat swelling)  VACCINATION STATUS: Immunization History  Administered Date(s) Administered   Influenza Split 08/15/2014   Influenza,inj,Quad PF,6+ Mos 06/10/2017, 04/30/2020   Tdap 09/02/2014    Diabetes She presents for her follow-up diabetic visit. She has type 2 diabetes mellitus. Onset time: She was diagnosed at approximate age of 57 years. 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 There are no hypoglycemic associated symptoms. Pertinent negatives for hypoglycemia include no confusion, headaches, pallor or seizures. Pertinent negatives for diabetes include no chest pain, no fatigue, no polydipsia, no polyphagia and no polyuria. There are no hypoglycemic complications. Symptoms are worsening. Diabetic complications include PVD and retinopathy. Pertinent negatives for diabetic complications include no autonomic neuropathy or peripheral neuropathy. (She has bilateral Charcot's feet, bilateral lower extremity lymphedema.) Risk factors for coronary artery disease include diabetes mellitus, dyslipidemia, hypertension, obesity  and sedentary lifestyle. Current diabetic treatment includes intensive insulin program. She is compliant with treatment most of the time. Her weight is increasing steadily. She is following a generally unhealthy diet. She has not had a previous visit with a dietitian (She missed her appointment.). She never participates in exercise. Her home blood glucose trend is increasing steadily. Her breakfast blood glucose range is generally >200 mg/dl. Her lunch blood glucose range is generally >200 mg/dl. Her dinner blood glucose range is generally >200 mg/dl. Her bedtime blood glucose range is generally >200 mg/dl. Her overall blood glucose range is >200 mg/dl. (She returns from her nursing home accompanied by her nursing home aide. CGM analysis shows 20% time in range, 46% level 1 hyperglycemia, 34% level 2 hyperglycemia.    Her point-of-care A1c is 8.4%, increasing from 8.1%.  She did not have any hypoglycemia.  She could not tolerate the Ozempic given to her during her last visit.        ) An ACE inhibitor/angiotensin II receptor blocker is not being taken. Eye exam is current.  Hyperlipidemia This is a chronic problem. The current episode started more than 1 year ago. The problem is uncontrolled. Recent lipid tests were reviewed and are high. Exacerbating diseases include diabetes, hypothyroidism and obesity. Pertinent negatives include no chest pain, myalgias or shortness of breath. She is currently on no antihyperlipidemic treatment. Risk factors for coronary artery disease include dyslipidemia, diabetes mellitus, obesity, a sedentary lifestyle, post-menopausal and hypertension.  Thyroid Problem Presents for initial visit. Patient reports no cold intolerance, diarrhea, fatigue, heat intolerance or palpitations. Past treatments include levothyroxine. The following procedures have not been performed: radioiodine uptake scan and thyroidectomy. Her past medical history is significant for diabetes and  hyperlipidemia.    Review of systems   Objective:    BP 124/76   Pulse 72   Wt Readings from Last 3 Encounters:  03/29/21 (!) 333 lb (151 kg)  12/17/20 (!) 326 lb 1 oz (147.9 kg)  10/09/20 (!) 326 lb (147.9 kg)     Physical Exam- Limited  Constitutional: On recliner.  She is status post bilateral below-knee amputation.   Psych: Reluctant affect.     Latest Ref Rng & Units 03/07/2022   12:00 AM 03/28/2021   12:00 AM 12/17/2020   11:31 PM  CMP  Glucose 70 - 99 mg/dL   150   BUN 4 - '21 21     22     '$ 38   Creatinine 0.5 - 1.1 1.1     1.0  1.19   Sodium 137 - 147 141      133   Potassium 3.5 - 5.1 mEq/L 4.5      3.5   Chloride 99 - 108 105      97   CO2 13 - '22 27      25   '$ Calcium 8.7 - 10.7 8.6      9.1   Total Protein 6.5 - 8.1 g/dL   6.9   Total Bilirubin 0.3 - 1.2 mg/dL   1.1   Alkaline Phos 25 - 125 114      93   AST 13 - 35 18      15   ALT 7 - 35 U/L 14      18      This result is from an external source.       Diabetic Labs (most recent): Lab Results  Component Value Date   HGBA1C 8.4 (A) 09/17/2022   HGBA1C 8.1 (A) 06/18/2022   HGBA1C 8.6 (A) 03/19/2022   MICROALBUR 114.5 10/20/2019   MICROALBUR 126.9 11/18/2018   MICROALBUR 2.0 10/08/2017    Lipid Panel     Component Value Date/Time   CHOL 152 03/07/2022 0000   TRIG 163 (A) 03/07/2022 0000   HDL 36 03/07/2022 0000   CHOLHDL 4.5 07/03/2020 1001   VLDL 42 (H) 04/25/2015 1001   LDLCALC 83 03/07/2022 0000   LDLCALC 115 (H) 07/03/2020 1001    Assessment & Plan:   1. Uncontrolled type 2 diabetes mellitus with complication, with long-term current use of insulin ([place])   Her diabetes is complicated by bilateral peripheral neuropathy , lateral Charcot's feet status post bilateral below-knee amputation due to complicated cellulitis/osteomyelitis, history of noncompliance/nonadherence, and possible retinopathy.  - She has chronically uncontrolled symptomatic type 2 DM of approximately since age  79 years duration. She returns from her nursing home accompanied by her nursing home aide. CGM analysis shows 20% time in range, 46% level 1 hyperglycemia, 34% level 2 hyperglycemia.    Her point-of-care A1c is 8.4%, increasing from 8.1%.  She did not have any hypoglycemia.  She could not tolerate the Ozempic given to her during her last visit.    -She remains at extremely high risk for more acute and chronic complications of diabetes which include CAD, CVA, CKD, retinopathy, and neuropathy. These are all discussed in detail with the patient.   - I have counseled the patient on diet management and weight loss, by adopting a carbohydrate restricted/protein rich diet.  - Patient is advised to stick to a routine mealtimes to eat 3 meals  a day and avoid unnecessary snacks ( to snack only to correct hypoglycemia).   - she acknowledges that there is a room for improvement in her food and drink choices. - Suggestion is made for her to avoid simple carbohydrates  from her diet including Cakes, Sweet Desserts, Ice Cream, Soda (diet and regular), Sweet Tea, Candies, Chips, Cookies, Store Bought Juices, Alcohol , Artificial Sweeteners,  Coffee Creamer, and "Sugar-free" Products, Lemonade. This will help patient to have more stable blood glucose profile and potentially avoid unintended weight gain.  The following Lifestyle Medicine recommendations according to [place]  [facility]) were discussed and and offered to patient and she  agrees to start the journey:  A. Whole Foods, Plant-Based Nutrition comprising of fruits and vegetables, plant-based proteins, whole-grain carbohydrates was discussed in detail with the patient.   A list for source of those  nutrients were also provided to the patient.  Patient will use only water or unsweetened tea for hydration. B.  The need to stay away from risky substances including alcohol, smoking; obtaining 7 to 9 hours of restorative sleep, at  least 150 minutes of moderate intensity exercise weekly, the importance of healthy social connections,  and stress management techniques were discussed. C.  A full color page of  Calorie density of various food groups per pound showing examples of each food groups was provided to the patient.   - I have approached patient with the following individualized plan to manage diabetes and patient agrees:   -In light of her presentation with above target glycemic profile and the fact that she did not tolerate Ozempic, she will be considered for a higher dose of basal insulin. - Accordingly, I advised her to increase Lantus to 60 units nightly, continue Humalog  12 units 3 times daily AC for Premeal blood glucose readings above 90 mg per DL. - She is warned not to take insulin without monitoring blood glucose properly.   -Patient is encouraged to call clinic for blood glucose levels less than 70 or above 300 mg /dl. -She did not tolerate previous attempt on SGLT2 inhibitors as well.  - Patient specific target  A1c;  LDL, HDL, Triglycerides were discussed in detail.  2) BP/HTN:  -Her blood pressure is controlled to target.   She is advised to take her blood pressure medications in the morning .  Advised to continue lisinopril 20 mg daily, carvedilol 25 mg p.o. twice daily, advised on salt restrictions.     3) Lipids/HPL: Most recent lipid panel showed LDL improving to 83.   She is advised to continue Lipitor 40 mg p.o. nightly.      Side effects and precautions discussed with her.       4) vitamin D deficiency:  -She is advised to continue vitamin D2 50,000 units weekly in addition to her regular maintenance dose of vitamin D3 5000 units daily.     5)  Weight/Diet:   She is a candidate for modest weight loss.  She cannot exercise optimally.  Whole Foods plant-based diet will help her achieve reasonable weight loss.  6) hypothyroidism:  -Her recent thyroid function tests are consistent with  appropriate replacement.  She is advised to continue levothyroxine 150 mcg p.o. daily before breakfast.    - We discussed about the correct intake of her thyroid hormone, on empty stomach at fasting, with water, separated by at least 30 minutes from breakfast and other medications,  and separated by more than 4 hours from calcium, iron, multivitamins, acid reflux medications (PPIs). -Patient is made aware of the fact that thyroid hormone replacement is needed for life, dose to be adjusted by periodic monitoring of thyroid function tests.   6) Chronic Care/Health Maintenance:  -Patient is advised to continue lisinopril, Lipitor. She is encouraged to continue to follow up with Ophthalmology, Podiatrist at least yearly or according to recommendations, and advised to stay away from smoking. I have recommended yearly flu vaccine and pneumonia vaccination at least every 5 years; moderate intensity exercise for up to 150 minutes weekly; and  sleep for at least 7 hours a day.   I advised patient to maintain close follow up with their PCP for primary care needs.   I spent  28  minutes in the care of the patient today including review of labs from CMP, Lipids, Thyroid Function, Hematology (current and previous including  abstractions from other facilities); face-to-face time discussing  her blood glucose readings/logs, discussing hypoglycemia and hyperglycemia episodes and symptoms, medications doses, her options of short and long term treatment based on the latest standards of care / guidelines;  discussion about incorporating lifestyle medicine;  and documenting the encounter. Risk reduction counseling performed per USPSTF guidelines to reduce  obesity and cardiovascular risk factors.     Please refer to Patient Instructions for Blood Glucose Monitoring and Insulin/Medications Dosing Guide"  in media tab for additional information. Please  also refer to " Patient Self Inventory" in the Media  tab for reviewed  elements of pertinent patient history.  Michelle Skinner participated in the discussions, expressed understanding, and voiced agreement with the above plans.  All questions were answered to her satisfaction. she is encouraged to contact clinic should she have any questions or concerns prior to her return visit.   Follow up plan: Return in about 4 months (around 01/17/2023) for Fasting Labs  in AM B4 8, A1c -[place].  Glade Lloyd, MD Phone: [phone]  Fax: [phone]  -  This note was partially dictated with voice recognition software. Similar sounding words can be transcribed inadequately or may not  be corrected upon review.  09/17/2022, 3:53 PM

## 2022-09-17 NOTE — Patient Instructions (Signed)

## 2022-09-26 IMAGING — CT CT TIBIA FIBULA *L* W/ CM
3 of 5 series · 11 of 33 positions shown, 12 images · IV contrast (agent unspecified)
Comparison: None.

CONTRAST:  70mL OMNIPAQUE IOHEXOL 300 MG/ML  SOLN

CLINICAL DATA: Pain and swelling in both lower extremities.

EXAM:
CT OF THE LOWER RIGHT EXTREMITY WITH CONTRAST, CT OF THE RIGHT LOWER
EXTREMITY WITH CONTRAST
TECHNIQUE: Multidetector CT imaging of bilateral lower extremities was
performed according to the standard protocol following intravenous
contrast administration.

[Series 4: left cor soft tissue · coronal · 0.56mm/px · 3 of 153 slices shown]
[im 31/153  bone]
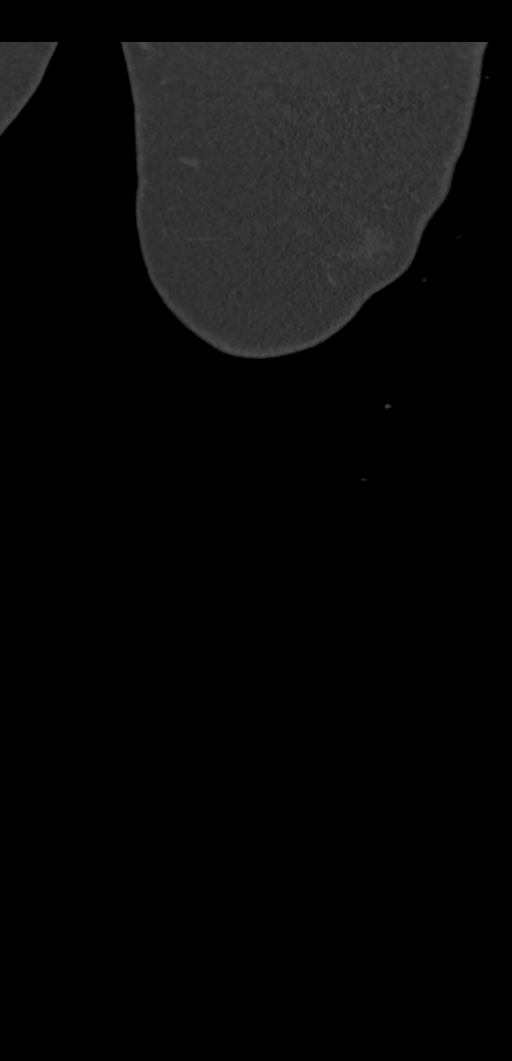
[im 61/153  bone]
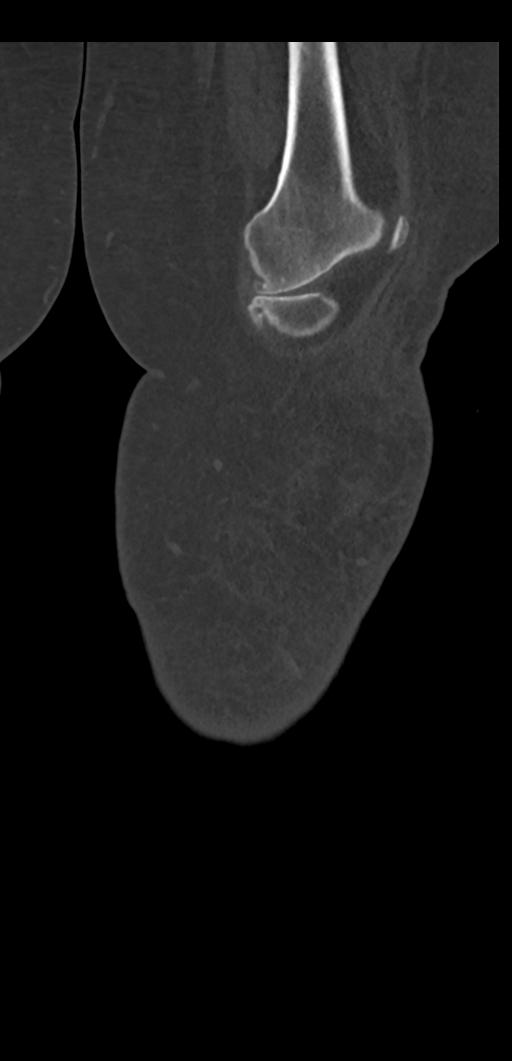
[im 92/153  bone]
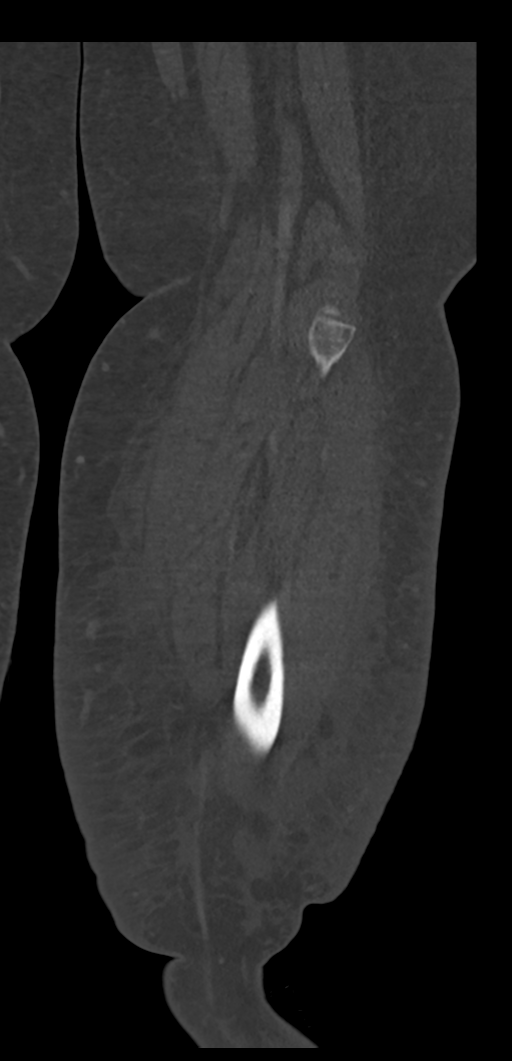

[Series 5: left sag bone · sagittal · 0.62mm/px · 4 of 122 slices shown]
[im 25/122  bone]
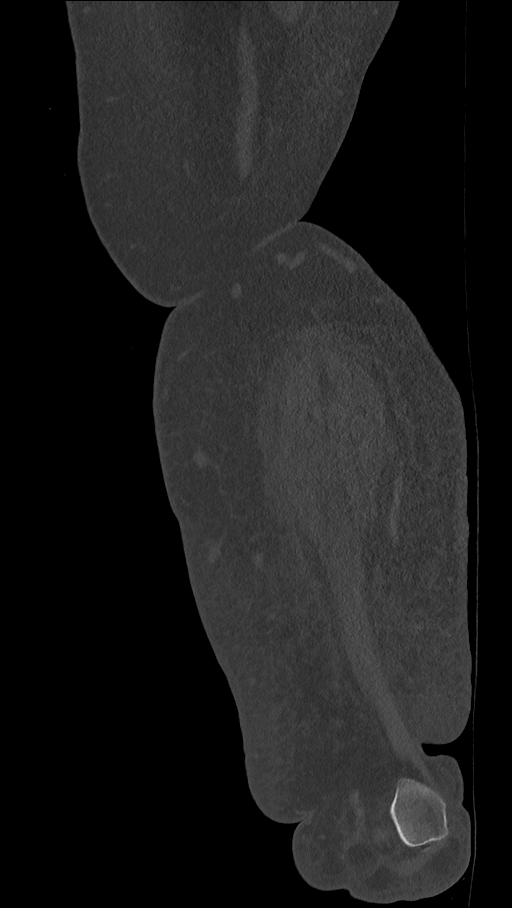
[im 49/122  bone]
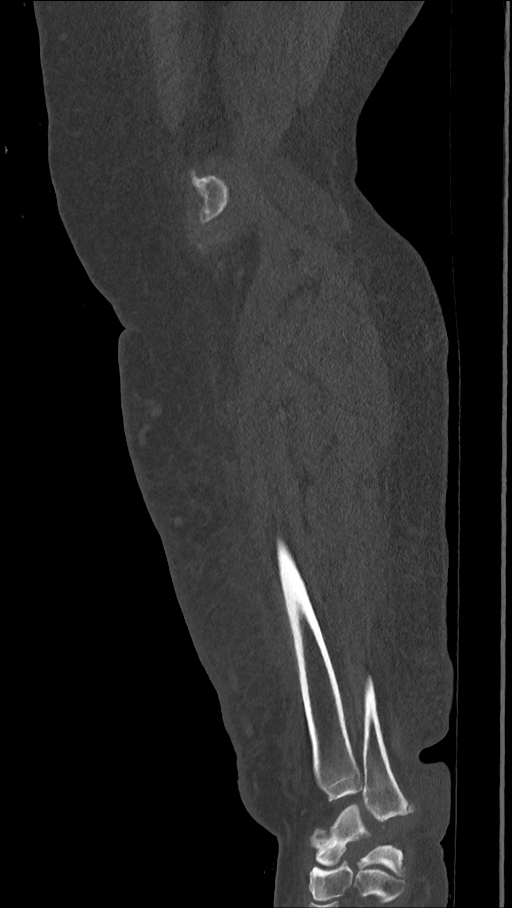
[im 73/122  bone]
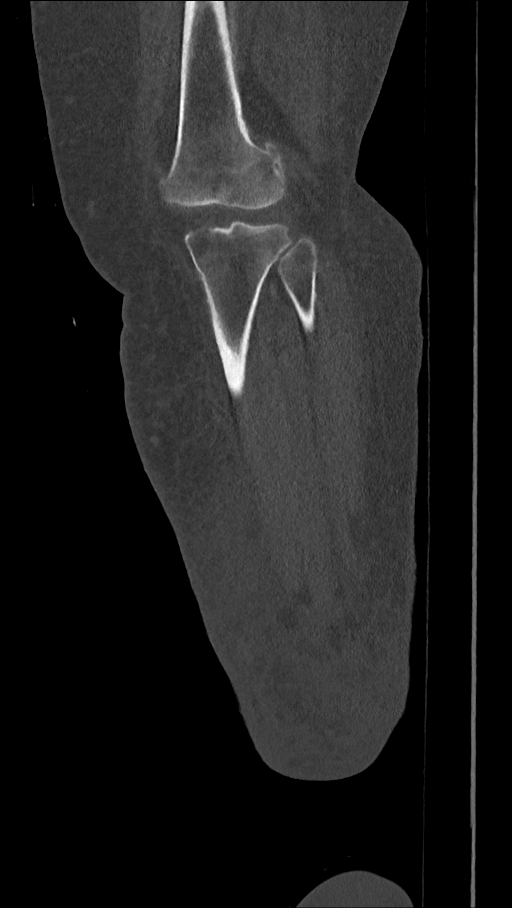
[im 97/122  bone]
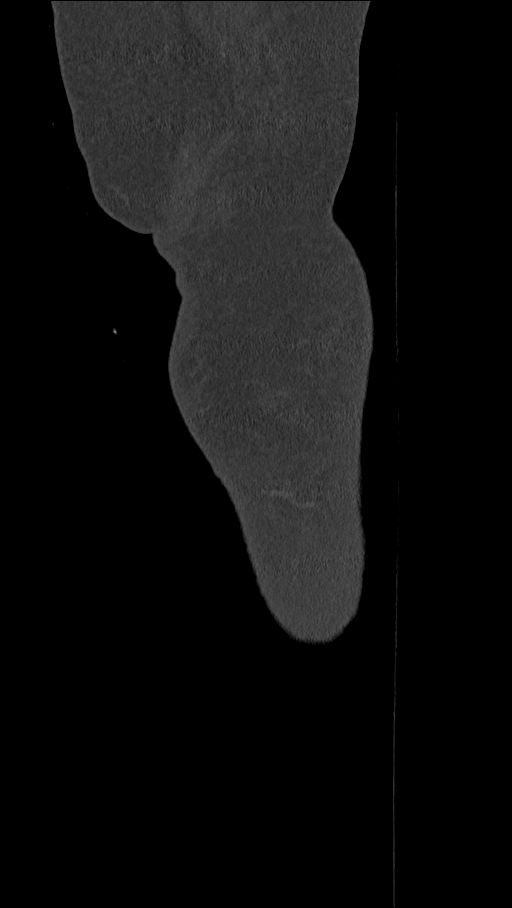

[Series 7: left thins · axial · 0.56mm/px · z∈[+572,+950]mm · 4 of 809 slices shown, 5 images]
[im 180/809  soft-tissue]
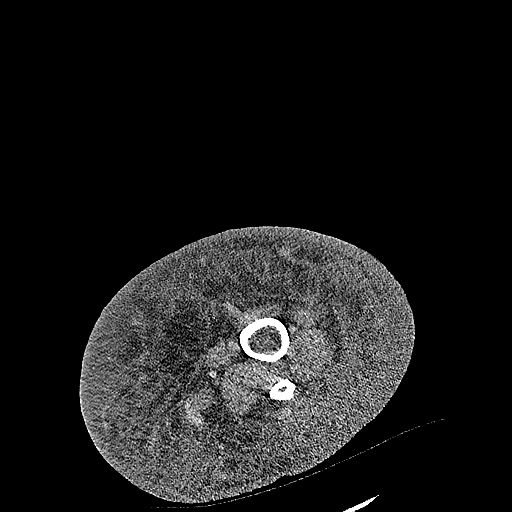
[im 180/809  bone]
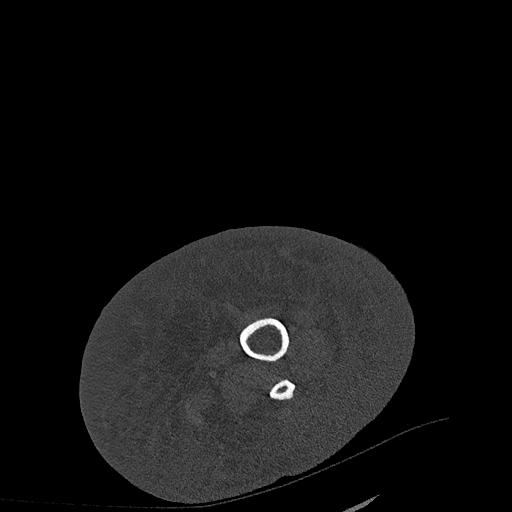
[im 360/809  bone]
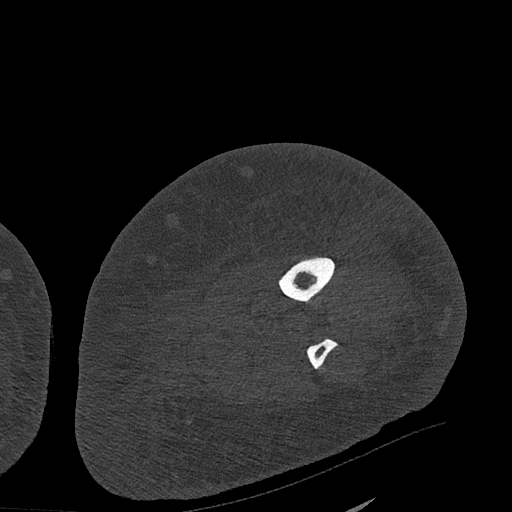
[im 539/809  bone]
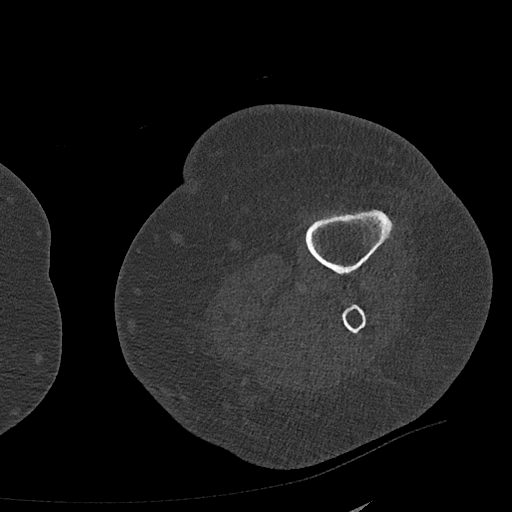
[im 719/809  bone]
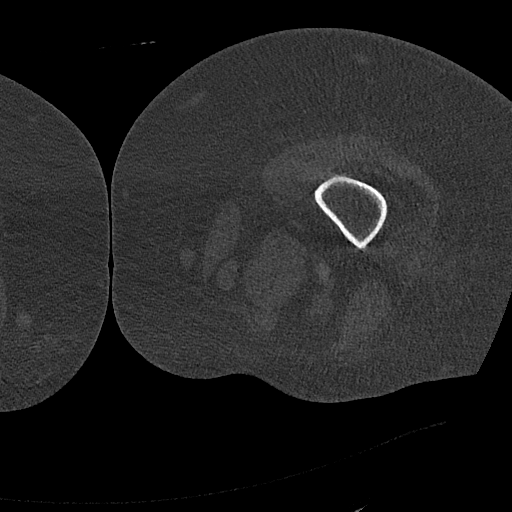

[11 of 33 positions shown; findings below may reference images not displayed]

FINDINGS: Diffuse and fairly marked subcutaneous soft tissue
swelling/edema/fluid involving both lower extremities, left worse
than right. I do not see a discrete rim enhancing fluid collection
to suggest a drainable soft tissue abscess. No definite findings for
myofasciitis or pyomyositis.

No findings suspicious for septic arthritis or osteomyelitis.
Moderate degenerative changes noted at the ankles and knees.
IMPRESSION: 1. Diffuse and fairly marked subcutaneous soft tissue
swelling/edema/fluid involving both lower extremities, left worse
than right. Findings consistent with cellulitis. No findings for
drainable soft tissue abscess, myofasciitis or pyomyositis.
2. No CT findings suspicious for septic arthritis or osteomyelitis.

## 2022-11-06 DIAGNOSIS — E113511 Type 2 diabetes mellitus with proliferative diabetic retinopathy with macular edema, right eye: Secondary | ICD-10-CM | POA: Diagnosis not present

## 2022-11-06 DIAGNOSIS — E118 Type 2 diabetes mellitus with unspecified complications: Secondary | ICD-10-CM | POA: Diagnosis not present

## 2022-11-06 DIAGNOSIS — M6281 Muscle weakness (generalized): Secondary | ICD-10-CM | POA: Diagnosis not present

## 2022-11-07 DIAGNOSIS — E785 Hyperlipidemia, unspecified: Secondary | ICD-10-CM | POA: Diagnosis not present

## 2022-11-07 DIAGNOSIS — I10 Essential (primary) hypertension: Secondary | ICD-10-CM | POA: Diagnosis not present

## 2022-11-07 DIAGNOSIS — F32A Depression, unspecified: Secondary | ICD-10-CM | POA: Diagnosis not present

## 2022-11-07 DIAGNOSIS — E039 Hypothyroidism, unspecified: Secondary | ICD-10-CM | POA: Diagnosis not present

## 2022-11-07 DIAGNOSIS — E119 Type 2 diabetes mellitus without complications: Secondary | ICD-10-CM | POA: Diagnosis not present

## 2022-11-07 DIAGNOSIS — E118 Type 2 diabetes mellitus with unspecified complications: Secondary | ICD-10-CM | POA: Diagnosis not present

## 2022-11-07 DIAGNOSIS — Z794 Long term (current) use of insulin: Secondary | ICD-10-CM | POA: Diagnosis not present

## 2022-11-07 DIAGNOSIS — M6281 Muscle weakness (generalized): Secondary | ICD-10-CM | POA: Diagnosis not present

## 2022-11-07 DIAGNOSIS — I509 Heart failure, unspecified: Secondary | ICD-10-CM | POA: Diagnosis not present

## 2022-11-07 DIAGNOSIS — K219 Gastro-esophageal reflux disease without esophagitis: Secondary | ICD-10-CM | POA: Diagnosis not present

## 2022-11-08 DIAGNOSIS — M6281 Muscle weakness (generalized): Secondary | ICD-10-CM | POA: Diagnosis not present

## 2022-11-08 DIAGNOSIS — E118 Type 2 diabetes mellitus with unspecified complications: Secondary | ICD-10-CM | POA: Diagnosis not present

## 2022-11-11 DIAGNOSIS — E118 Type 2 diabetes mellitus with unspecified complications: Secondary | ICD-10-CM | POA: Diagnosis not present

## 2022-11-11 DIAGNOSIS — M6281 Muscle weakness (generalized): Secondary | ICD-10-CM | POA: Diagnosis not present

## 2022-11-12 DIAGNOSIS — M6281 Muscle weakness (generalized): Secondary | ICD-10-CM | POA: Diagnosis not present

## 2022-11-12 DIAGNOSIS — E118 Type 2 diabetes mellitus with unspecified complications: Secondary | ICD-10-CM | POA: Diagnosis not present

## 2022-11-13 DIAGNOSIS — E118 Type 2 diabetes mellitus with unspecified complications: Secondary | ICD-10-CM | POA: Diagnosis not present

## 2022-11-13 DIAGNOSIS — M6281 Muscle weakness (generalized): Secondary | ICD-10-CM | POA: Diagnosis not present

## 2022-11-14 DIAGNOSIS — E118 Type 2 diabetes mellitus with unspecified complications: Secondary | ICD-10-CM | POA: Diagnosis not present

## 2022-11-14 DIAGNOSIS — M6281 Muscle weakness (generalized): Secondary | ICD-10-CM | POA: Diagnosis not present

## 2022-11-15 DIAGNOSIS — E118 Type 2 diabetes mellitus with unspecified complications: Secondary | ICD-10-CM | POA: Diagnosis not present

## 2022-11-15 DIAGNOSIS — M6281 Muscle weakness (generalized): Secondary | ICD-10-CM | POA: Diagnosis not present

## 2022-11-16 DIAGNOSIS — M6281 Muscle weakness (generalized): Secondary | ICD-10-CM | POA: Diagnosis not present

## 2022-11-16 DIAGNOSIS — E118 Type 2 diabetes mellitus with unspecified complications: Secondary | ICD-10-CM | POA: Diagnosis not present

## 2022-11-19 DIAGNOSIS — E118 Type 2 diabetes mellitus with unspecified complications: Secondary | ICD-10-CM | POA: Diagnosis not present

## 2022-11-19 DIAGNOSIS — I10 Essential (primary) hypertension: Secondary | ICD-10-CM | POA: Diagnosis not present

## 2022-11-20 DIAGNOSIS — E782 Mixed hyperlipidemia: Secondary | ICD-10-CM | POA: Diagnosis not present

## 2022-11-20 DIAGNOSIS — M6281 Muscle weakness (generalized): Secondary | ICD-10-CM | POA: Diagnosis not present

## 2022-11-20 DIAGNOSIS — B379 Candidiasis, unspecified: Secondary | ICD-10-CM | POA: Diagnosis not present

## 2022-11-20 DIAGNOSIS — E118 Type 2 diabetes mellitus with unspecified complications: Secondary | ICD-10-CM | POA: Diagnosis not present

## 2022-11-20 DIAGNOSIS — E1122 Type 2 diabetes mellitus with diabetic chronic kidney disease: Secondary | ICD-10-CM | POA: Diagnosis not present

## 2022-11-21 DIAGNOSIS — M6281 Muscle weakness (generalized): Secondary | ICD-10-CM | POA: Diagnosis not present

## 2022-11-21 DIAGNOSIS — E118 Type 2 diabetes mellitus with unspecified complications: Secondary | ICD-10-CM | POA: Diagnosis not present

## 2022-11-25 DIAGNOSIS — E118 Type 2 diabetes mellitus with unspecified complications: Secondary | ICD-10-CM | POA: Diagnosis not present

## 2022-11-25 DIAGNOSIS — M6281 Muscle weakness (generalized): Secondary | ICD-10-CM | POA: Diagnosis not present

## 2022-11-29 DIAGNOSIS — E118 Type 2 diabetes mellitus with unspecified complications: Secondary | ICD-10-CM | POA: Diagnosis not present

## 2022-11-29 DIAGNOSIS — M6281 Muscle weakness (generalized): Secondary | ICD-10-CM | POA: Diagnosis not present

## 2022-12-02 DIAGNOSIS — M6281 Muscle weakness (generalized): Secondary | ICD-10-CM | POA: Diagnosis not present

## 2022-12-02 DIAGNOSIS — E118 Type 2 diabetes mellitus with unspecified complications: Secondary | ICD-10-CM | POA: Diagnosis not present

## 2022-12-02 DIAGNOSIS — Z794 Long term (current) use of insulin: Secondary | ICD-10-CM | POA: Diagnosis not present

## 2022-12-02 DIAGNOSIS — E1165 Type 2 diabetes mellitus with hyperglycemia: Secondary | ICD-10-CM | POA: Diagnosis not present

## 2022-12-03 DIAGNOSIS — E118 Type 2 diabetes mellitus with unspecified complications: Secondary | ICD-10-CM | POA: Diagnosis not present

## 2022-12-03 DIAGNOSIS — R300 Dysuria: Secondary | ICD-10-CM | POA: Diagnosis not present

## 2022-12-03 DIAGNOSIS — M6281 Muscle weakness (generalized): Secondary | ICD-10-CM | POA: Diagnosis not present

## 2022-12-04 DIAGNOSIS — G894 Chronic pain syndrome: Secondary | ICD-10-CM | POA: Diagnosis not present

## 2022-12-04 DIAGNOSIS — G546 Phantom limb syndrome with pain: Secondary | ICD-10-CM | POA: Diagnosis not present

## 2022-12-05 DIAGNOSIS — E118 Type 2 diabetes mellitus with unspecified complications: Secondary | ICD-10-CM | POA: Diagnosis not present

## 2022-12-05 DIAGNOSIS — M6281 Muscle weakness (generalized): Secondary | ICD-10-CM | POA: Diagnosis not present

## 2022-12-06 DIAGNOSIS — E118 Type 2 diabetes mellitus with unspecified complications: Secondary | ICD-10-CM | POA: Diagnosis not present

## 2022-12-06 DIAGNOSIS — M6281 Muscle weakness (generalized): Secondary | ICD-10-CM | POA: Diagnosis not present

## 2022-12-25 ENCOUNTER — Ambulatory Visit (INDEPENDENT_AMBULATORY_CARE_PROVIDER_SITE_OTHER): Payer: 59 | Admitting: "Endocrinology

## 2022-12-25 ENCOUNTER — Encounter: Payer: Self-pay | Admitting: "Endocrinology

## 2022-12-25 VITALS — BP 128/66 | HR 64

## 2022-12-25 DIAGNOSIS — E559 Vitamin D deficiency, unspecified: Secondary | ICD-10-CM

## 2022-12-25 DIAGNOSIS — E1159 Type 2 diabetes mellitus with other circulatory complications: Secondary | ICD-10-CM

## 2022-12-25 DIAGNOSIS — E782 Mixed hyperlipidemia: Secondary | ICD-10-CM | POA: Diagnosis not present

## 2022-12-25 DIAGNOSIS — E039 Hypothyroidism, unspecified: Secondary | ICD-10-CM

## 2022-12-25 DIAGNOSIS — I10 Essential (primary) hypertension: Secondary | ICD-10-CM | POA: Diagnosis not present

## 2022-12-25 DIAGNOSIS — Z794 Long term (current) use of insulin: Secondary | ICD-10-CM

## 2022-12-25 LAB — POCT GLYCOSYLATED HEMOGLOBIN (HGB A1C): HbA1c, POC (controlled diabetic range): 9.5 % — AB (ref 0.0–7.0)

## 2022-12-25 MED ORDER — FREESTYLE LIBRE 2 SENSOR MISC: 1.0000 | 3 refills | Status: AC

## 2022-12-25 MED ORDER — INSULIN GLARGINE 100 UNIT/ML ~~LOC~~ SOLN: 70.0000 [IU] | Freq: Every day | SUBCUTANEOUS | 2 refills | Status: DC

## 2022-12-25 NOTE — Patient Instructions (Signed)

## 2022-12-25 NOTE — Progress Notes (Unsigned)
12/25/2022  Endocrinology follow-up note  Subjective:    Patient ID: Michelle Skinner, female    DOB: 07/08/1966,    Past Medical History:  Diagnosis Date   Amputee, above knee, left (HCC)    Amputee, above knee, right (HCC)    Anemia    Cervical cancer (HCC)    Depression    Diabetes mellitus, type II (HCC)    GERD (gastroesophageal reflux disease)    Hyperlipidemia    Neuropathy    PTSD (post-traumatic stress disorder)    Past Surgical History:  Procedure Laterality Date   ABDOMINAL HYSTERECTOMY     AMPUTATION Bilateral 09/13/2020   Procedure: BILATERAL BELOW KNEE AMPUTATIONS;  Surgeon: Nadara Mustard, MD;  Location: [facility] OR;  Service: Orthopedics;  Laterality: Bilateral;   COLONOSCOPY WITH PROPOFOL N/A 05/25/2018   Procedure: COLONOSCOPY WITH PROPOFOL;  Surgeon: Corbin Ade, MD;  Location: [facility];  Service: Endoscopy;  Laterality: N/A;  7:30am   I & D EXTREMITY Bilateral 09/10/2020   Procedure: IRRIGATION AND DEBRIDEMENT FEET;;  Surgeon: Nadara Mustard, MD;  Location: [facility] OR;  Service: Orthopedics;  Laterality: Bilateral;   IRRIGATION AND DEBRIDEMENT FOOT Bilateral 09/08/2020   Procedure: IRRIGATION AND DEBRIDEMENT FOOT;  Surgeon: Lucretia Roers, MD;  Location: [facility] ORS;  Service: General;  Laterality: Bilateral;  left foot 11 x 10.5 x 1    OTHER SURGICAL HISTORY Right    R Foot- I&D   POLYPECTOMY  05/25/2018   Procedure: POLYPECTOMY;  Surgeon: Corbin Ade, MD;  Location: [facility];  Service: Endoscopy;;  colon   UMBILICAL HERNIA REPAIR  2007   Social History   Socioeconomic History   Marital status: Married    Spouse name: Not on file   Number of children: Not on file   Years of education: Not on file   Highest education level: Not on file  Occupational History   Not on file  Tobacco Use   Smoking status: Never   Smokeless tobacco: Never  Vaping Use   Vaping Use: Never used  Substance and Sexual Activity   Alcohol use: No    Alcohol/week: 0.0  standard drinks of alcohol   Drug use: No   Sexual activity: Yes    Birth control/protection: Surgical  Other Topics Concern   Not on file  Social History Narrative   Not on file   Social Determinants of Health   Financial Resource Strain: Not on file  Food Insecurity: Not on file  Transportation Needs: Not on file  Physical Activity: Not on file  Stress: Not on file  Social Connections: Not on file   Outpatient Encounter Medications as of 12/25/2022  Medication Sig   Ascorbic Acid (VITAMIN C PO) Take 1 tablet by mouth at bedtime.   atorvastatin (LIPITOR) 40 MG tablet Take 1 tablet (40 mg total) by mouth at bedtime.   Calcium Carbonate (CALCIUM 500 PO) Take 1 tablet by mouth daily.   carvedilol (COREG) 12.5 MG tablet Take 1 tablet (12.5 mg total) by mouth 2 (two) times daily.   Cholecalciferol (VITAMIN D3) 125 MCG (5000 UT) CAPS Take 1 capsule (5,000 Units total) by mouth daily.   Continuous Blood Gluc Receiver (FREESTYLE LIBRE 2 READER) DEVI As directed   Continuous Glucose Sensor (FREESTYLE LIBRE 2 SENSOR) MISC 1 Piece by Does not apply route every 14 (fourteen) days.   enoxaparin (LOVENOX) 80 MG/0.8ML injection Inject 0.7 mLs (70 mg total) into the skin daily.   Ferrous Sulfate (  IRON PO) Take 1 tablet by mouth at bedtime. (Patient not taking: Reported on 06/18/2022)   Ferrous Sulfate (IRON) 325 (65 Fe) MG TABS Take 1 tablet by mouth at bedtime. (Patient not taking: Reported on 06/18/2022)   FLUoxetine (PROZAC) 40 MG capsule Take 40 mg by mouth at bedtime.   folic acid (FOLVITE) 1 MG tablet Take 1 tablet (1 mg total) by mouth daily.   furosemide (LASIX) 40 MG tablet Take 40 mg by mouth 2 (two) times daily.   gabapentin (NEURONTIN) 300 MG capsule Take 1 capsule (300 mg total) by mouth 3 (three) times daily. (Patient not taking: Reported on 07/26/2021)   insulin glargine (LANTUS) 100 UNIT/ML injection Inject 0.7 mLs (70 Units total) into the skin at bedtime.   insulin lispro  (HUMALOG) 100 UNIT/ML KwikPen Inject 14-20 Units into the skin 3 (three) times daily before meals.   levothyroxine (SYNTHROID) 150 MCG tablet Take 1 tablet (150 mcg total) by mouth daily before breakfast.   lisinopril (ZESTRIL) 20 MG tablet Take 20 mg by mouth at bedtime.   magnesium citrate SOLN Take 296 mLs (1 Bottle total) by mouth daily as needed for severe constipation.   Multiple Vitamins-Minerals (PRESERVISION AREDS 2) CAPS Take 1 capsule by mouth 2 (two) times daily.   neomycin-bacitracin-polymyxin (NEOSPORIN) ointment Apply 1 application topically every 12 (twelve) hours. To legs   nutrition supplement, JUVEN, (JUVEN) PACK Take 1 packet by mouth 2 (two) times daily between meals.   ondansetron (ZOFRAN) 4 MG tablet Take 4 mg by mouth every 8 (eight) hours as needed for nausea or vomiting.   Oxycodone HCl 10 MG TABS Take 10 mg by mouth every 6 (six) hours as needed.   pantoprazole (PROTONIX) 40 MG tablet TAKE ONE (1) TABLET EACH DAY (Patient taking differently: Take 40 mg by mouth at bedtime.)   polyethylene glycol (MIRALAX / GLYCOLAX) 17 g packet Take 17 g by mouth 2 (two) times daily as needed for mild constipation.   promethazine (PHENERGAN) 25 MG suppository Place 1 suppository (25 mg total) rectally every 6 (six) hours as needed for nausea or vomiting.   senna-docusate (SENOKOT-S) 8.6-50 MG tablet Take 1 tablet by mouth 2 (two) times daily as needed for moderate constipation.   Thiamine HCl (VITAMIN B-1 PO) Take 1 tablet by mouth daily.   [DISCONTINUED] Continuous Blood Gluc Sensor (FREESTYLE LIBRE 2 SENSOR) MISC 1 Piece by Does not apply route every 14 (fourteen) days.   [DISCONTINUED] insulin glargine (LANTUS) 100 UNIT/ML injection Inject 0.6 mLs (60 Units total) into the skin at bedtime.   No facility-administered encounter medications on file as of 12/25/2022.   ALLERGIES: Allergies  Allergen Reactions   Sulfa Antibiotics Diarrhea, Nausea And Vomiting and Rash    Clindamycin/Lincomycin Diarrhea and Nausea And Vomiting   Invokana [Canagliflozin] Diarrhea and Nausea And Vomiting   Latex     Rash, itching, over a long period of time   Ozempic (0.25 Or 0.5 Mg-Dose) [Semaglutide(0.25 Or 0.5mg -Dos)] Diarrhea and Nausea And Vomiting   Polyurethane [Urethane]     Blistering and swelling   Tape Itching and Swelling    Reaction after a few days use/ please use paper tape   Bactrim [Sulfamethoxazole-Trimethoprim] Diarrhea, Nausea And Vomiting and Rash   Cherry Itching, Swelling and Rash    Throat swelling   Gabapentin Diarrhea, Nausea And Vomiting and Rash   Other Itching, Swelling and Rash    Reaction to Hot peppers (throat swelling)   VACCINATION STATUS: Immunization History  Administered  Date(s) Administered   Influenza Split 08/15/2014   Influenza,inj,Quad PF,6+ Mos 06/10/2017, 04/30/2020   Tdap 09/02/2014    Diabetes She presents for her follow-up diabetic visit. She has type 2 diabetes mellitus. Onset time: She was diagnosed at approximate age of 36 years. 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 There are no hypoglycemic associated symptoms. Pertinent negatives for hypoglycemia include no confusion, headaches, pallor or seizures. Pertinent negatives for diabetes include no chest pain, no fatigue, no polydipsia, no polyphagia and no polyuria. There are no hypoglycemic complications. Symptoms are worsening. Diabetic complications include PVD and retinopathy. Pertinent negatives for diabetic complications include no autonomic neuropathy or peripheral neuropathy. (She has bilateral Charcot's feet, bilateral lower extremity lymphedema.) Risk factors for coronary artery disease include diabetes mellitus, dyslipidemia, hypertension, obesity and sedentary lifestyle. Current  diabetic treatment includes intensive insulin program. She is compliant with treatment most of the time. Her weight is increasing steadily. She is following a generally unhealthy diet. When asked about meal planning, she reported none. She has not had a previous visit with a dietitian (She missed her appointment.). She never participates in exercise. Her home blood glucose trend is increasing steadily. Her breakfast blood glucose range is generally >200 mg/dl. Her lunch blood glucose range is generally >200 mg/dl. Her dinner blood glucose range is generally >200 mg/dl. Her bedtime blood glucose range is generally >200 mg/dl. Her overall blood glucose range is >200 mg/dl. (She returns from her nursing home alone , not accompanied by her nursing home aide. CGM analysis shows 8% time in range, 28% level 1 hyperglycemia, 64% level 2 hyperglycemia.  This profile is worsening from her prior presentation.  Her point-of-care A1c is 9.5% increasing from 8.1. She did not have any hypoglycemia.  She could not tolerate the Ozempic given to her during her last visit.        ) An ACE inhibitor/angiotensin II receptor blocker is not being taken. Eye exam is current.  Hyperlipidemia This is a chronic problem. The current episode started more than 1 year ago. The problem is uncontrolled. Recent lipid tests were reviewed and are high. Exacerbating diseases include diabetes, hypothyroidism and obesity. Pertinent negatives include no chest pain, myalgias or shortness of breath. She is currently on no antihyperlipidemic treatment. Risk factors for coronary artery disease include dyslipidemia, diabetes mellitus, obesity, a sedentary lifestyle, post-menopausal and hypertension.  Thyroid Problem Presents for initial visit. Patient reports no cold intolerance, diarrhea, fatigue, heat intolerance or palpitations. Past treatments include levothyroxine. The following procedures have not been performed: radioiodine uptake scan and  thyroidectomy. Her past medical history is significant for diabetes and hyperlipidemia.    Review of systems   Objective:    BP 128/66   Pulse 64   Wt Readings from Last 3 Encounters:  03/29/21 (!) 333 lb (151 kg)  12/17/20 (!) 326 lb 1 oz (147.9 kg)  10/09/20 (!) 326 lb (147.9 kg)     Physical Exam- Limited  Constitutional: On recliner.  She is status post bilateral below-knee amputation.   Psych: Reluctant affect.     Latest Ref Rng & Units 03/07/2022   12:00 AM 03/28/2021   12:00 AM 12/17/2020   11:31 PM  CMP  Glucose 70 - 99 mg/dL   119   BUN 4 - 21 21     22      38  Creatinine 0.5 - 1.1 1.1     1.0     1.19   Sodium 137 - 147 141      133   Potassium 3.5 - 5.1 mEq/L 4.5      3.5   Chloride 99 - 108 105      97   CO2 13 - 22 27      25    Calcium 8.7 - 10.7 8.6      9.1   Total Protein 6.5 - 8.1 g/dL   6.9   Total Bilirubin 0.3 - 1.2 mg/dL   1.1   Alkaline Phos 25 - 125 114      93   AST 13 - 35 18      15   ALT 7 - 35 U/L 14      18      This result is from an external source.       Diabetic Labs (most recent): Lab Results  Component Value Date   HGBA1C 9.5 (A) 12/25/2022   HGBA1C 8.4 (A) 09/17/2022   HGBA1C 8.1 (A) 06/18/2022   MICROALBUR 114.5 10/20/2019   MICROALBUR 126.9 11/18/2018   MICROALBUR 2.0 10/08/2017    Lipid Panel     Component Value Date/Time   CHOL 152 03/07/2022 0000   TRIG 163 (A) 03/07/2022 0000   HDL 36 03/07/2022 0000   CHOLHDL 4.5 07/03/2020 1001   VLDL 42 (H) 04/25/2015 1001   LDLCALC 83 03/07/2022 0000   LDLCALC 115 (H) 07/03/2020 1001    Assessment & Plan:   1. Uncontrolled type 2 diabetes mellitus with complication, with long-term current use of insulin (HCC)   Her diabetes is complicated by bilateral peripheral neuropathy , lateral Charcot's feet status post bilateral below-knee amputation due to complicated cellulitis/osteomyelitis, history of noncompliance/nonadherence, and possible retinopathy.  - She has  chronically uncontrolled symptomatic type 2 DM of approximately since age 69 years duration.  She returns from her nursing home alone , not accompanied by her nursing home aide. CGM analysis shows 8% time in range, 28% level 1 hyperglycemia, 64% level 2 hyperglycemia.  This profile is worsening from her prior presentation.  Her point-of-care A1c is 9.5% increasing from 8.1. She did not have any hypoglycemia.  She could not tolerate the Ozempic given to her during her last visit.    -She remains at extremely high risk for more acute and chronic complications of diabetes which include CAD, CVA, CKD, retinopathy, and neuropathy. These are all discussed in detail with the patient.   - I have counseled the patient on diet management and weight loss, by adopting a carbohydrate restricted/protein rich diet.  - Patient is advised to stick to a routine mealtimes to eat 3 meals  a day and avoid unnecessary snacks ( to snack only to correct hypoglycemia).   - she acknowledges that there is a room for improvement in her food and drink choices. - Suggestion is made for her to avoid simple carbohydrates  from her diet including Cakes, Sweet Desserts, Ice Cream, Soda (diet and regular), Sweet Tea, Candies, Chips, Cookies, Store Bought Juices, Alcohol , Artificial Sweeteners,  Coffee Creamer, and "Sugar-free" Products, Lemonade. This will help patient to have more stable blood glucose profile and potentially avoid unintended weight gain.  The following Lifestyle Medicine recommendations according to American College of Lifestyle Medicine  [facility]) were discussed and and offered to patient and she  agrees to start the journey:  A. Whole Foods, Plant-Based Nutrition  comprising of fruits and vegetables, plant-based proteins, whole-grain carbohydrates was discussed in detail with the patient.   A list for source of those nutrients were also provided to the patient.  Patient will use only water or unsweetened tea for  hydration. B.  The need to stay away from risky substances including alcohol, smoking; obtaining 7 to 9 hours of restorative sleep, at least 150 minutes of moderate intensity exercise weekly, the importance of healthy social connections,  and stress management techniques were discussed. C.  A full color page of  Calorie density of various food groups per pound showing examples of each food groups was provided to the patient.   - I have approached patient with the following individualized plan to manage diabetes and patient agrees:   -In light of her presentation with significantly above target glycemic profile which is worsening and the fact that she did not tolerate GLP-1 receptor agonist, she would need a higher dose of insulin in order for her to control and maintaining diabetes to target.     - Accordingly, I advised her to increase Lantus to 70 units nightly, increase Humalog to 14 units 3 times daily AC for Premeal blood glucose readings above 90 mg per DL. - She is warned not to take insulin without monitoring blood glucose properly.   -Patient is encouraged to call clinic for blood glucose levels less than 70 or above 300 mg /dl. -She did not tolerate previous attempt on SGLT2 inhibitors as well.  She did not tolerate GLP-1 receptor agonist.  - Patient specific target  A1c;  LDL, HDL, Triglycerides were discussed in detail.  2) BP/HTN:  -Her blood pressure is controlled to target.   She is advised to take her blood pressure medications in the morning .  Advised to continue lisinopril 20 mg daily, carvedilol 25 mg p.o. twice daily, advised on salt restrictions.     3) Lipids/HPL: Most recent lipid panel showed LDL improving to 83.  She is advised to continue Lipitor 40 mg p.o. nightly.   Side effects and precautions discussed with her.       4) vitamin D deficiency:  -She is advised to continue vitamin D2 50,000 units weekly in addition to her regular maintenance dose of vitamin D3  5000 units daily.     5)  Weight/Diet:   She is morbidly obese, sedentary, a candidate for modest weight loss.  She cannot exercise optimally.  Whole Foods plant-based diet will help her achieve reasonable weight loss.  6) hypothyroidism:  -Her recent thyroid function tests are consistent with appropriate replacement.  She is advised to continue levothyroxine 150 mcg p.o. daily before breakfast.     - We discussed about the correct intake of her thyroid hormone, on empty stomach at fasting, with water, separated by at least 30 minutes from breakfast and other medications,  and separated by more than 4 hours from calcium, iron, multivitamins, acid reflux medications (PPIs). -Patient is made aware of the fact that thyroid hormone replacement is needed for life, dose to be adjusted by periodic monitoring of thyroid function tests.    6) Chronic Care/Health Maintenance:  -Patient is advised to continue lisinopril, Lipitor. She is encouraged to continue to follow up with Ophthalmology, Podiatrist at least yearly or according to recommendations, and advised to stay away from smoking. I have recommended yearly flu vaccine and pneumonia vaccination at least every 5 years; moderate intensity exercise for up to 150 minutes weekly; and  sleep for at  least 7 hours a day.   I advised patient to maintain close follow up with their PCP for primary care needs.  I spent  41  minutes in the care of the patient today including review of labs from CMP, Lipids, Thyroid Function, Hematology (current and previous including abstractions from other facilities); face-to-face time discussing  her blood glucose readings/logs, discussing hypoglycemia and hyperglycemia episodes and symptoms, medications doses, her options of short and long term treatment based on the latest standards of care / guidelines;  discussion about incorporating lifestyle medicine;  and documenting the encounter. Risk reduction counseling performed  per USPSTF guidelines to reduce obesity and cardiovascular risk factors.     Please refer to Patient Instructions for Blood Glucose Monitoring and Insulin/Medications Dosing Guide"  in media tab for additional information. Please  also refer to " Patient Self Inventory" in the Media  tab for reviewed elements of pertinent patient history.  Michelle Skinner participated in the discussions, expressed understanding, and voiced agreement with the above plans.  All questions were answered to her satisfaction. she is encouraged to contact clinic should she have any questions or concerns prior to her return visit.    Follow up plan: Return in about 3 months (around 03/27/2023) for Bring Meter/CGM Device/Logs- A1c in Office.  Marquis Lunch, MD Phone: [phone]  Fax: [phone]  -  This note was partially dictated with voice recognition software. Similar sounding words can be transcribed inadequately or may not  be corrected upon review.  12/25/2022, 5:41 PM

## 2022-12-26 ENCOUNTER — Encounter: Payer: Self-pay | Admitting: "Endocrinology

## 2022-12-26 DIAGNOSIS — Z794 Long term (current) use of insulin: Secondary | ICD-10-CM | POA: Insufficient documentation

## 2023-01-28 ENCOUNTER — Ambulatory Visit (INDEPENDENT_AMBULATORY_CARE_PROVIDER_SITE_OTHER): Payer: 59 | Admitting: Family

## 2023-01-28 ENCOUNTER — Encounter: Payer: Self-pay | Admitting: Family

## 2023-01-28 DIAGNOSIS — R600 Localized edema: Secondary | ICD-10-CM

## 2023-01-28 DIAGNOSIS — Z89512 Acquired absence of left leg below knee: Secondary | ICD-10-CM

## 2023-01-28 DIAGNOSIS — Z89511 Acquired absence of right leg below knee: Secondary | ICD-10-CM | POA: Diagnosis not present

## 2023-01-28 NOTE — Progress Notes (Signed)
Office Visit Note   Patient: Michelle Skinner           Date of Birth: 21-Jul-1965           MRN: [id number] Visit Date: 01/28/2023              Requested by: Kirstie Peri, MD [address] [place],  [place] [address] PCP: Kirstie Peri, MD  Chief Complaint  Patient presents with   Right Leg - Follow-up    Hx bilateral BKA   Left Leg - Follow-up      HPI: The patient is a 57 year old woman who present today concern for uncontrolled swelling in bilateral residual limbs reports she is developing blisters to the posterior calfs.  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  She currently is not using prostheses  She resides at [facility].  She has been given an order for new motion power wheelchair as well as shrinkers at her last visit about 6 months ago she has been having difficulty with [facility] and ordering and obtaining these DME items  Denies any fever chills no open area to the blisters no concern for infection  Assessment & Plan: Visit Diagnoses: No diagnosis found.  Plan: Discussed the importance of compression and elevation of the residual limbs recommended she wear shrinker's around-the-clock elevate the lower extremities.  Have asked that [facility] provide back to the extension for her wheelchair chair so she might elevate her lower extremities wrapped with ace wraps until sugars are obtained  Follow-Up Instructions: Return if symptoms worsen or fail to improve.   Ortho Exam  Patient is alert, oriented, no adenopathy, well-dressed, normal affect, normal respiratory effort. On examination bilateral residual limbs there is significant edema with pitting there are a few fluid-filled blisters serous fluid to the posterior calves bilaterally there is no erythema no weeping.  Imaging: No results found. No images are attached to the encounter.  Labs: Lab Results  Component Value Date   HGBA1C 9.5 (A) 12/25/2022   HGBA1C  8.4 (A) 09/17/2022   HGBA1C 8.1 (A) 06/18/2022   REPTSTATUS 12/21/2020 FINAL 12/18/2020   GRAMSTAIN  09/10/2020    RARE WBC PRESENT, PREDOMINANTLY PMN ABUNDANT GRAM POSITIVE COCCI RARE GRAM NEGATIVE RODS    CULT >=100,000 COLONIES/mL ESCHERICHIA COLI (A) 12/18/2020   LABORGA ESCHERICHIA COLI (A) 12/18/2020     Lab Results  Component Value Date   ALBUMIN 2.9 (A) 03/07/2022   ALBUMIN 2.8 (L) 12/17/2020   ALBUMIN <1.0 (L) 09/17/2020   PREALBUMIN 11.1 (L) 09/16/2020    Lab Results  Component Value Date   MG 2.2 09/17/2020   MG 1.9 09/16/2020   MG 1.9 09/15/2020   Lab Results  Component Value Date   VD25OH 12 (L) 10/20/2019   VD25OH 22 (L) 04/01/2019   VD25OH 29 (L) 08/17/2018    Lab Results  Component Value Date   PREALBUMIN 11.1 (L) 09/16/2020      Latest Ref Rng & Units 12/17/2020   11:31 PM 09/28/2020    5:48 AM 09/17/2020    3:40 AM  CBC EXTENDED  WBC 4.0 - 10.5 K/uL 10.1  3.2  11.9   RBC 3.87 - 5.11 MIL/uL 4.04  2.68  2.81   Hemoglobin 12.0 - 15.0 g/dL 32.9  7.9  8.2   HCT 51.8 - 46.0 % 36.7  25.4  25.7   Platelets 150 - 400 K/uL 173  220  308   NEUT# 1.7 -  7.7 K/uL 9.0  2.3    Lymph# 0.7 - 4.0 K/uL 0.4  0.6       There is no height or weight on file to calculate BMI.  Orders:  No orders of the defined types were placed in this encounter.  No orders of the defined types were placed in this encounter.    Procedures: No procedures performed  Clinical Data: No additional findings.  ROS:  All other systems negative, except as noted in the HPI. Review of Systems  Objective: Vital Signs: There were no vitals taken for this visit.  Specialty Comments:  No specialty comments available.  PMFS History: Patient Active Problem List   Diagnosis Date Noted   Insulin long-term use (HCC) 12/26/2022   Peripherally inserted central catheter (PICC) in place 10/05/2020   MSSA bacteremia    Acute hematogenous osteomyelitis of both feet (HCC)    Severe  protein-calorie malnutrition (HCC)    BMI 50.0-59.9, adult (HCC)    Plantar ulcer of right foot (HCC)    Plantar ulcer of left foot (HCC)    Lymphedema    Sepsis (HCC) 09/06/2020   Cellulitis 04/27/2020   AKI (acute kidney injury) (HCC) 04/27/2020   Stasis dermatitis 04/27/2020   Anxiety 03/31/2020   Depression 03/31/2020   GERD (gastroesophageal reflux disease) 03/31/2020   Posttraumatic stress disorder 03/31/2020   Personal history of noncompliance with medical treatment, presenting hazards to health 02/09/2018   Abnormal CT of the abdomen 10/20/2017   Dilated cbd, acquired 10/20/2017   Essential hypertension, benign 01/14/2017   Primary hypothyroidism 05/03/2015   Mixed hyperlipidemia 05/03/2015   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   Morbid obesity (HCC) 04/25/2015   Vitamin D deficiency 04/25/2015   Cervical cancer (HCC) 10/05/2012   Past Medical History:  Diagnosis Date   Amputee, above knee, left (HCC)    Amputee, above knee, right (HCC)    Anemia    Cervical cancer (HCC)    Depression    Diabetes mellitus, type II (HCC)    GERD (gastroesophageal reflux disease)    Hyperlipidemia    Neuropathy    PTSD (post-traumatic stress disorder)     Family History  Problem Relation Age of Onset   Hypertension Mother    Diabetes Father    Hyperlipidemia Father    CAD Father    Stroke Father    Osteoporosis Maternal Grandmother    Cancer Maternal Grandmother    Hypertension Maternal Grandmother    Colon cancer Neg Hx     Past Surgical History:  Procedure Laterality Date   ABDOMINAL HYSTERECTOMY     AMPUTATION Bilateral 09/13/2020   Procedure: BILATERAL BELOW KNEE AMPUTATIONS;  Surgeon: Nadara Mustard, MD;  Location: [facility] OR;  Service: Orthopedics;  Laterality: Bilateral;   COLONOSCOPY WITH PROPOFOL N/A 05/25/2018   Procedure: COLONOSCOPY WITH PROPOFOL;  Surgeon: Corbin Ade, MD;  Location: [facility];  Service: Endoscopy;  Laterality: N/A;  7:30am    I & D EXTREMITY Bilateral 09/10/2020   Procedure: IRRIGATION AND DEBRIDEMENT FEET;;  Surgeon: Nadara Mustard, MD;  Location[facility] OR;  Service: Orthopedics;  Laterality: Bilateral;   IRRIGATION AND DEBRIDEMENT FOOT Bilateral 09/08/2020   Procedure: IRRIGATION AND DEBRIDEMENT FOOT;  Surgeon: Lucretia Roers, MD;  Location: [facility] ORS;  Service: General;  Laterality: Bilateral;  left foot 11 x 10.5 x 1    OTHER SURGICAL HISTORY Right    R Foot- I&D   POLYPECTOMY  05/25/2018  Procedure: POLYPECTOMY;  Surgeon: Corbin Ade, MD;  Location: [facility];  Service: Endoscopy;;  colon   UMBILICAL HERNIA REPAIR  2007   Social History   Occupational History   Not on file  Tobacco Use   Smoking status: Never   Smokeless tobacco: Never  Vaping Use   Vaping status: Never Used  Substance and Sexual Activity   Alcohol use: No    Alcohol/week: 0.0 standard drinks of alcohol   Drug use: No   Sexual activity: Yes    Birth control/protection: Surgical

## 2023-02-01 IMAGING — MR MR FOOT*R* W/O CM
5 series · 40 of 40 positions shown · non-contrast
Comparison: None.

CLINICAL DATA: Wounds on her the that are leaking

EXAM:
MRI OF THE RIGHT FOREFOOT WITHOUT CONTRAST
TECHNIQUE: Multiplanar, multisequence MR imaging of the right was performed. No
intravenous contrast was administered.

[Series 21: T1 · oblique · right · 3.0mm · 0.47mm/px · 10 of 44 slices shown (1 of 2)]
[im 1/44]
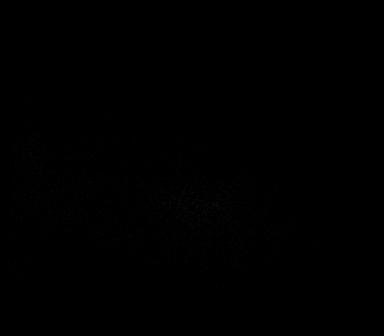
[im 5/44]
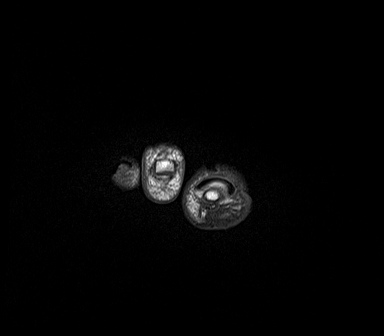
[im 10/44]
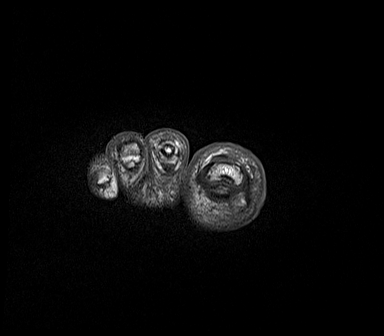
[im 15/44]
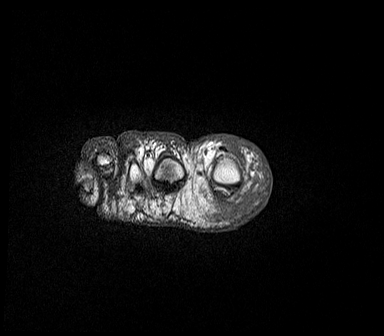
[im 20/44]
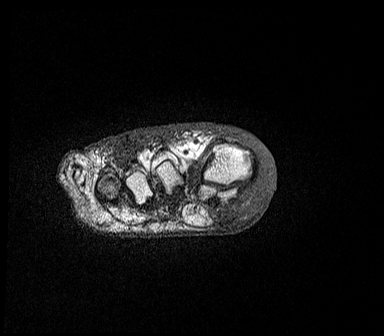
[im 24/44]
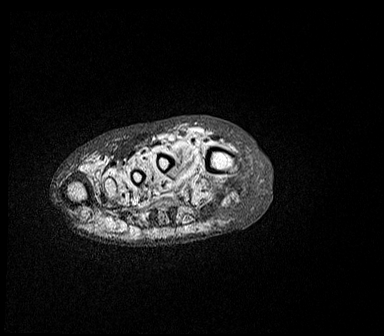
[im 29/44]
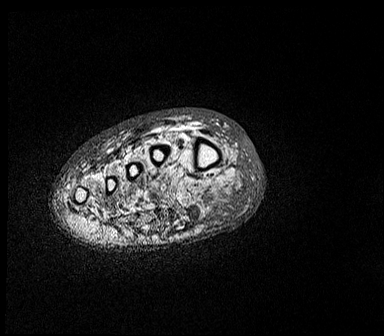
[im 34/44]
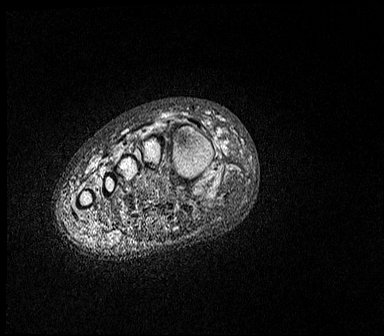
[im 39/44]
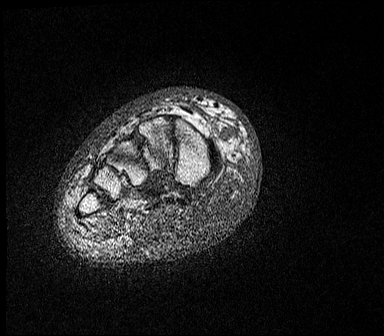
[im 44/44]
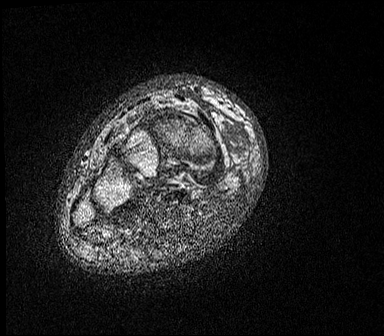

[Series 22: T2 fat-sat · oblique · right · 3.0mm · 0.70mm/px · 11 of 44 slices shown (1 of 2)]
[im 1/44]
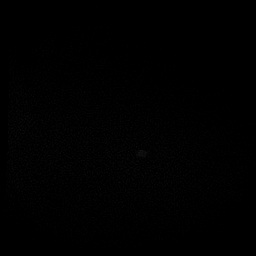
[im 5/44]
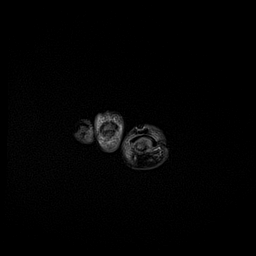
[im 9/44]
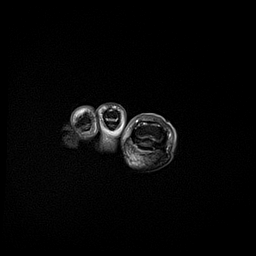
[im 13/44]
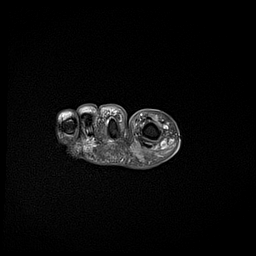
[im 18/44]
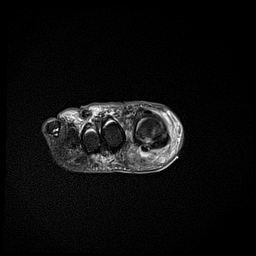
[im 22/44]
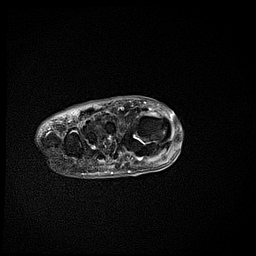
[im 26/44]
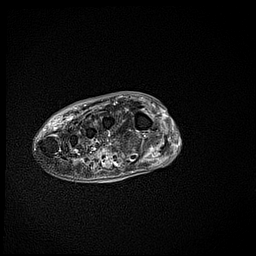
[im 31/44]
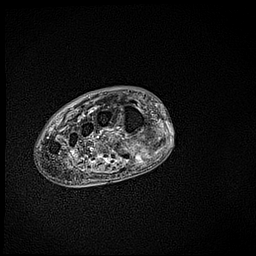
[im 35/44]
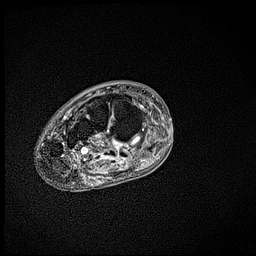
[im 39/44]
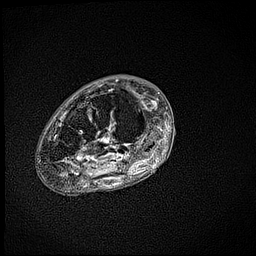
[im 44/44]
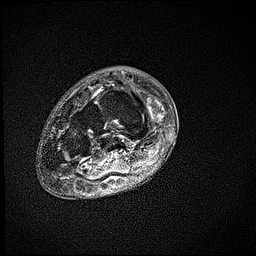

[Series 25: STIR · sagittal · right · 3.0mm · 0.70mm/px · 7 of 30 slices shown]
[im 1/30]
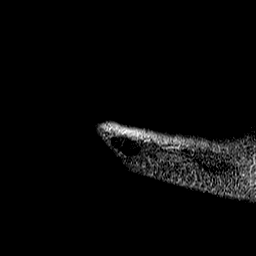
[im 5/30]
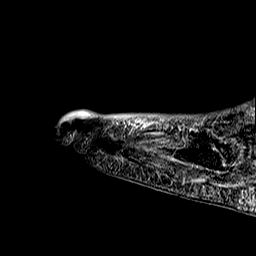
[im 10/30]
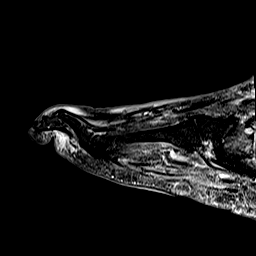
[im 15/30]
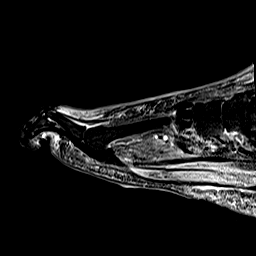
[im 20/30]
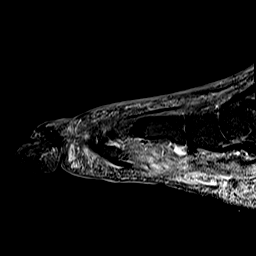
[im 25/30]
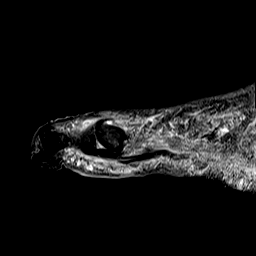
[im 30/30]
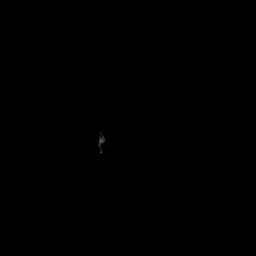

[Series 1026: T1 · oblique · right · 3.0mm · 0.52mm/px · 6 of 24 slices shown (2 of 2)]
[im 1/24]
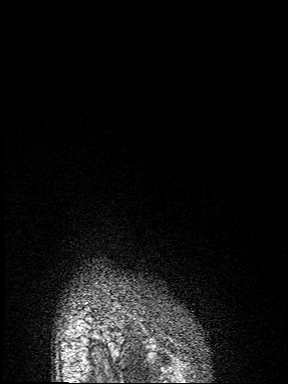
[im 5/24]
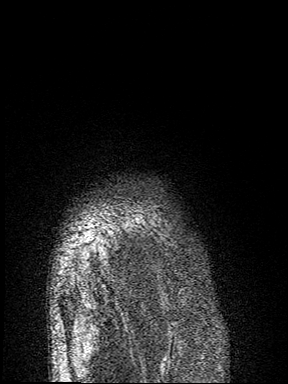
[im 10/24]
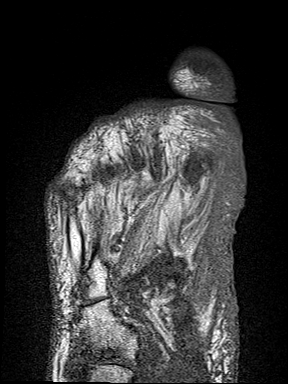
[im 14/24]
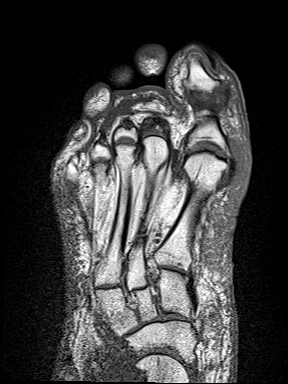
[im 19/24]
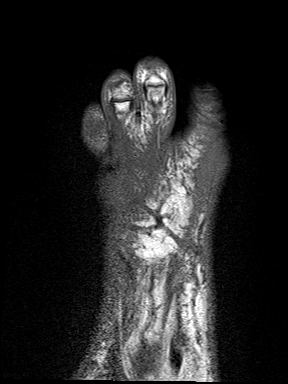
[im 24/24]
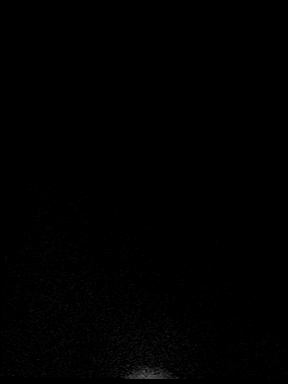

[Series 1032: T2 fat-sat · oblique · right · 3.0mm · 0.66mm/px · 6 of 24 slices shown (2 of 2)]
[im 1/24]
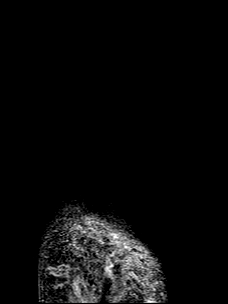
[im 5/24]
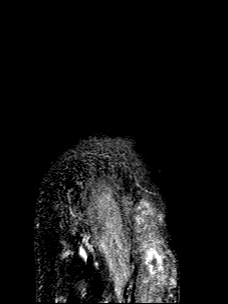
[im 10/24]
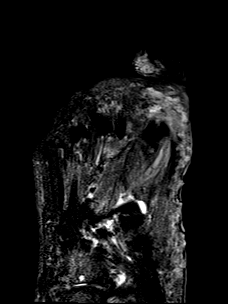
[im 14/24]
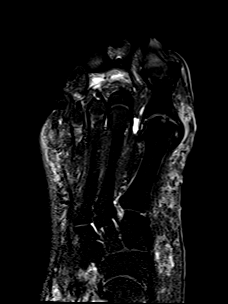
[im 19/24]
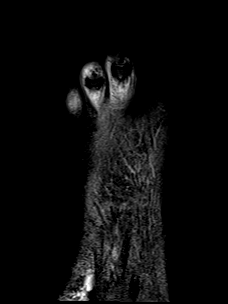
[im 24/24]
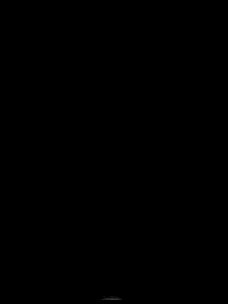

[40 of 40 positions shown; findings below may reference images not displayed]

FINDINGS: Bones/Joint/Cartilage

First MTP joint osteoarthritis seen with joint space loss and
marginal osteophyte formation. There is also partially visualized
osteoarthritis seen at the calcaneal cuboid joint with joint space
loss and subchondral cystic changes. No areas of cortical
destruction or periosteal reaction are seen. No large joint effusion
is noted. There is a small amount of joint fluid seen between the
first and second metatarsal heads.

Ligaments

The Lisfranc and collateral ligaments are intact.

Muscles and Tendons

Increased signal with diffuse fatty atrophy of the muscles are
noted. The flexor and extensor tendons are intact. A

Soft tissues

Scattered areas of superficial ulceration seen on the plantar and
medial aspect of the forefoot. There is diffuse underlying
subcutaneous edema and small amount of subcutaneous emphysema. On
the plantar surface beneath the first metatarsal head there is an
area of ulceration with question of a small possible sinus tract,
series 22, image 20.
IMPRESSION: Several areas of superficial ulceration with edema seen along the
medial and plantar aspect of the forefoot underneath the first and
second metatarsals. There is 1 area with suggestion of a possible
sinus tract/early abscess beneath the first metatarsal head. No
definite evidence of osteomyelitis.

## 2023-02-23 IMAGING — DX DG CHEST 1V PORT
2 series · 2 of 2 positions shown · non-contrast
Comparison: Radiograph 09/06/2020

CLINICAL DATA: Patient accidentally pulled central line

EXAM:
PORTABLE CHEST 1 VIEW

[chest ap grid (1 of 2)]
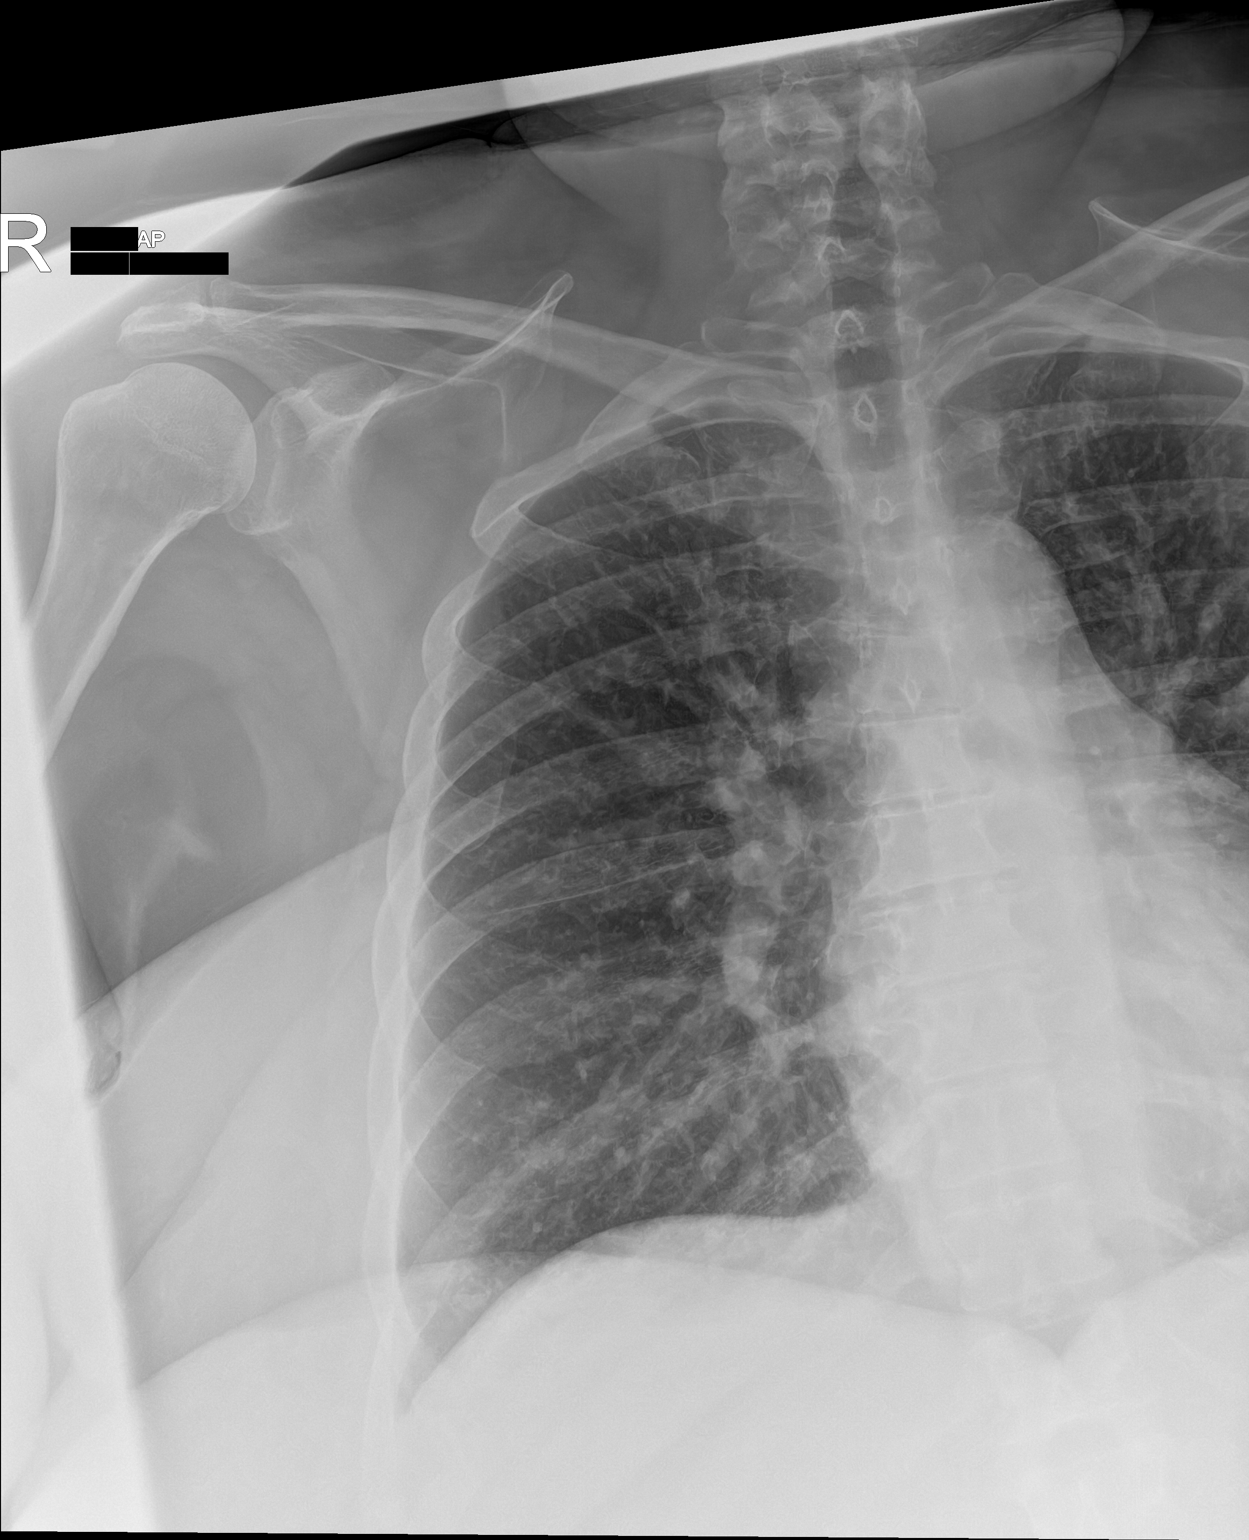

[chest ap grid (2 of 2)]
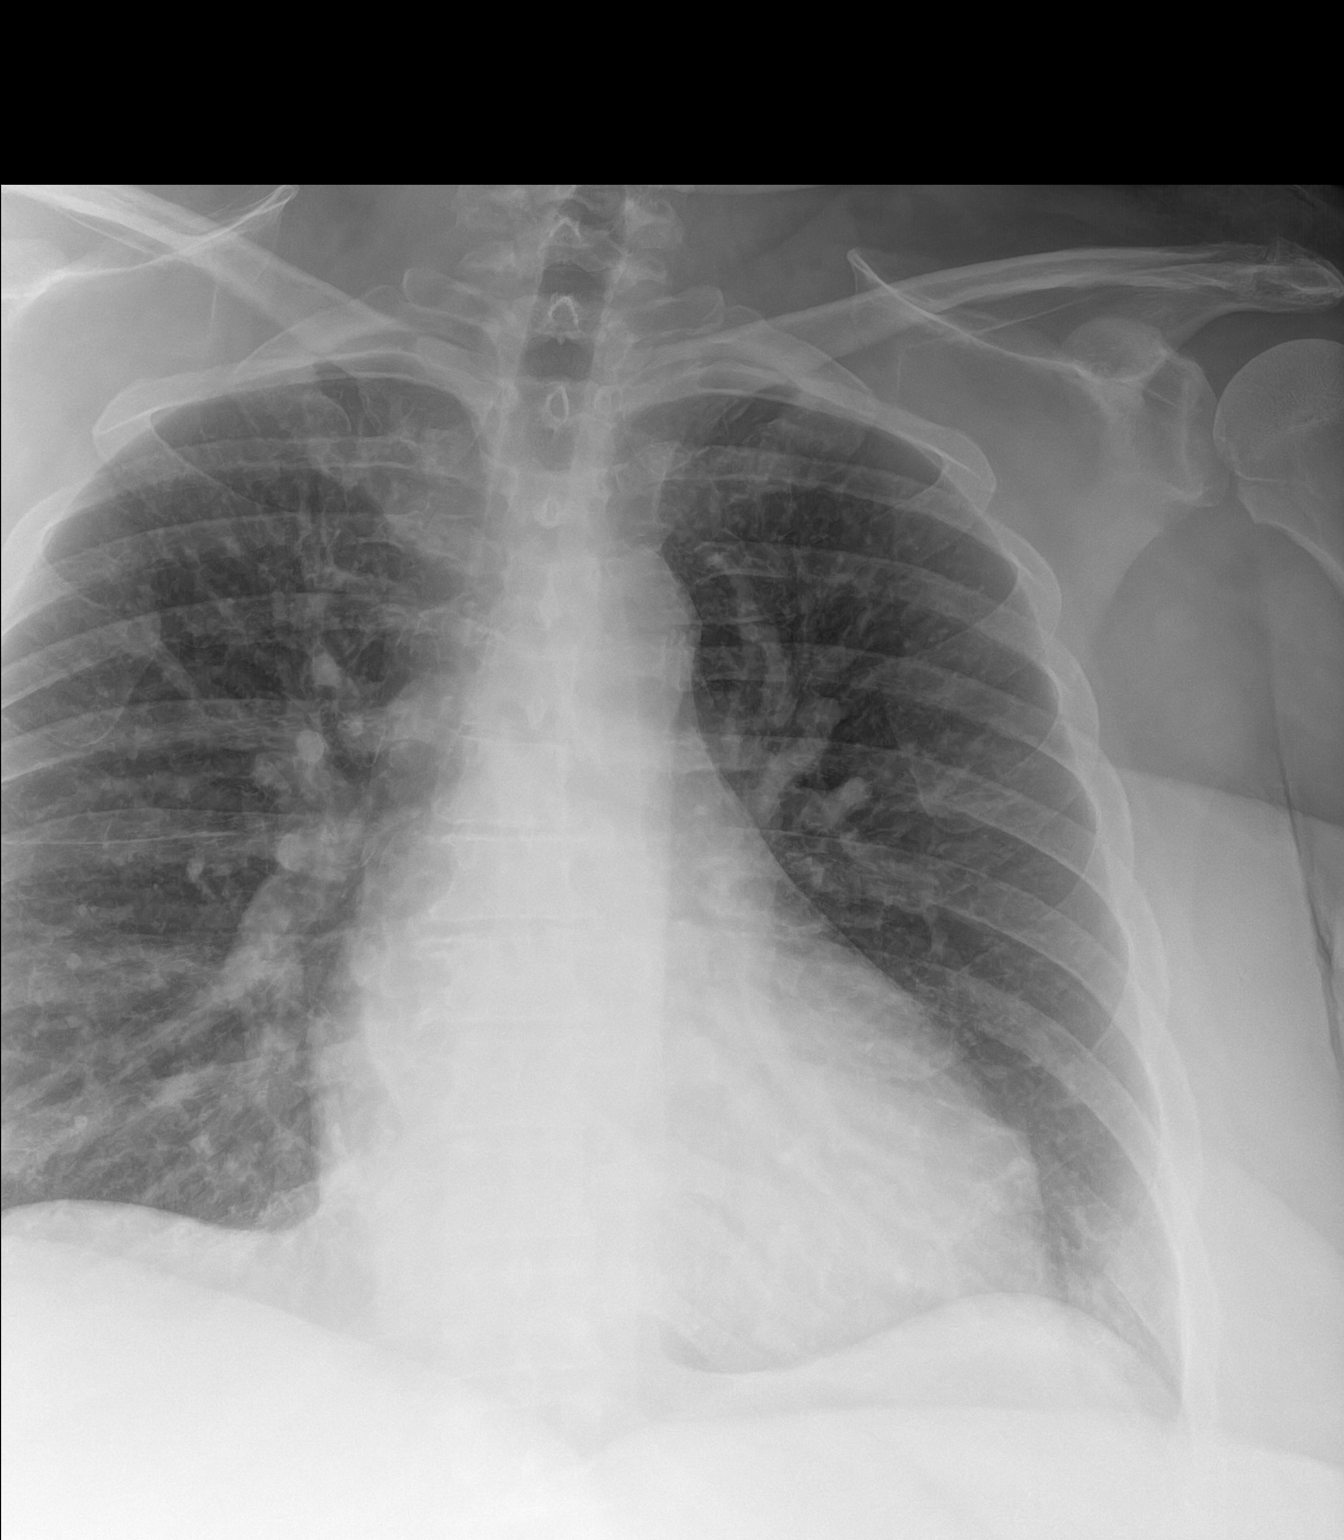

[2 of 2 positions shown; findings below may reference images not displayed]

FINDINGS: Borderline cardiomegaly is similar to comparison. Pulmonary vascular
congestion. Few peripheral septal lines and some mild fissural
thickening, could reflect early interstitial edematous change. No
consolidative or confluent opacity. No pneumothorax or layering
effusion. No acute osseous or soft tissue abnormality. Degenerative
changes are present in the imaged spine and shoulders.
IMPRESSION: Borderline cardiomegaly with pulmonary vascular congestion and
suggestion of early interstitial edematous change.

## 2023-03-12 DIAGNOSIS — E782 Mixed hyperlipidemia: Secondary | ICD-10-CM | POA: Diagnosis not present

## 2023-03-12 DIAGNOSIS — E1165 Type 2 diabetes mellitus with hyperglycemia: Secondary | ICD-10-CM | POA: Diagnosis not present

## 2023-03-26 DIAGNOSIS — F3189 Other bipolar disorder: Secondary | ICD-10-CM | POA: Diagnosis not present

## 2023-03-27 ENCOUNTER — Ambulatory Visit: Payer: Medicare HMO | Admitting: "Endocrinology

## 2023-03-27 ENCOUNTER — Encounter: Payer: Self-pay | Admitting: "Endocrinology

## 2023-03-27 VITALS — BP 112/68 | HR 64 | Wt >= 6400 oz

## 2023-03-27 DIAGNOSIS — E039 Hypothyroidism, unspecified: Secondary | ICD-10-CM

## 2023-03-27 DIAGNOSIS — Z794 Long term (current) use of insulin: Secondary | ICD-10-CM

## 2023-03-27 DIAGNOSIS — I10 Essential (primary) hypertension: Secondary | ICD-10-CM | POA: Diagnosis not present

## 2023-03-27 DIAGNOSIS — E1159 Type 2 diabetes mellitus with other circulatory complications: Secondary | ICD-10-CM | POA: Diagnosis not present

## 2023-03-27 DIAGNOSIS — E782 Mixed hyperlipidemia: Secondary | ICD-10-CM

## 2023-03-27 DIAGNOSIS — E559 Vitamin D deficiency, unspecified: Secondary | ICD-10-CM

## 2023-03-27 LAB — POCT GLYCOSYLATED HEMOGLOBIN (HGB A1C): HbA1c, POC (controlled diabetic range): 8.6 % — AB (ref 0.0–7.0)

## 2023-03-27 MED ORDER — INSULIN GLARGINE 100 UNIT/ML ~~LOC~~ SOLN: 80.0000 [IU] | Freq: Every day | SUBCUTANEOUS | 2 refills | Status: DC

## 2023-03-27 NOTE — Patient Instructions (Signed)
                                     Advice for Weight Management  -For most of us the best way to lose weight is by diet management. Generally speaking, diet management means consuming less calories intentionally which over time brings about progressive weight loss.  This can be achieved more effectively by avoiding ultra processed carbohydrates, processed meats, unhealthy fats.    It is critically important to know your numbers: how much calorie you are consuming and how much calorie you need. More importantly, our carbohydrates sources should be unprocessed naturally occurring  complex starch food items.  It is always important to balance nutrition also by  appropriate intake of proteins (mainly plant-based), healthy fats/oils, plenty of fruits and vegetables.   -The American College of Lifestyle Medicine (ACL M) recommends nutrition derived mostly from Whole Food, Plant Predominant Sources example an apple instead of applesauce or apple pie. Eat Plenty of vegetables, Mushrooms, fruits, Legumes, Whole Grains, Nuts, seeds in lieu of processed meats, processed snacks/pastries red meat, poultry, eggs.  Use only water or unsweetened tea for hydration.  The College also recommends the need to stay away from risky substances including alcohol, smoking; obtaining 7-9 hours of restorative sleep, at least 150 minutes of moderate intensity exercise weekly, importance of healthy social connections, and being mindful of stress and seek help when it is overwhelming.    -Sticking to a routine mealtime to eat 3 meals a day and avoiding unnecessary snacks is shown to have a big role in weight control. Under normal circumstances, the only time we burn stored energy is when we are hungry, so allow  some hunger to take place- hunger means no food between appropriate meal times, only water.  It is not advisable to starve.   -It is better to avoid simple carbohydrates including:  Cakes, Sweet Desserts, Ice Cream, Soda (diet and regular), Sweet Tea, Candies, Chips, Cookies, Store Bought Juices, Alcohol in Excess of  1-2 drinks a day, Lemonade,  Artificial Sweeteners, Doughnuts, Coffee Creamers, "Sugar-free" Products, etc, etc.  This is not a complete list.....    -Consulting with certified diabetes educators is proven to provide you with the most accurate and current information on diet.  Also, you may be  interested in discussing diet options/exchanges , we can schedule a visit with Michelle Skinner, RDN, CDE for individualized nutrition education.  -Exercise: If you are able: 30 -60 minutes a day ,4 days a week, or 150 minutes of moderate intensity exercise weekly.    The longer the better if tolerated.  Combine stretch, strength, and aerobic activities.  If you were told in the past that you have high risk for cardiovascular diseases, or if you are currently symptomatic, you may seek evaluation by your heart doctor prior to initiating moderate to intense exercise programs.                                  Additional Care Considerations for Diabetes/Prediabetes   -Diabetes  is a chronic disease.  The most important care consideration is regular follow-up with your diabetes care provider with the goal being avoiding or delaying its complications and to take advantage of advances in medications and technology.  If appropriate actions are taken early enough, type 2 diabetes can even be   reversed.  Seek information from the right source.  - Whole Food, Plant Predominant Nutrition is highly recommended: Eat Plenty of vegetables, Mushrooms, fruits, Legumes, Whole Grains, Nuts, seeds in lieu of processed meats, processed snacks/pastries red meat, poultry, eggs as recommended by American College of  Lifestyle Medicine (ACLM).  -Type 2 diabetes is known to coexist with other important comorbidities such as high blood pressure and high cholesterol.  It is critical to control not only the  diabetes but also the high blood pressure and high cholesterol to minimize and delay the risk of complications including coronary artery disease, stroke, amputations, blindness, etc.  The good news is that this diet recommendation for type 2 diabetes is also very helpful for managing high cholesterol and high blood blood pressure.  - Studies showed that people with diabetes will benefit from a class of medications known as ACE inhibitors and statins.  Unless there are specific reasons not to be on these medications, the standard of care is to consider getting one from these groups of medications at an optimal doses.  These medications are generally considered safe and proven to help protect the heart and the kidneys.    - People with diabetes are encouraged to initiate and maintain regular follow-up with eye doctors, foot doctors, dentists , and if necessary heart and kidney doctors.     - It is highly recommended that people with diabetes quit smoking or stay away from smoking, and get yearly  flu vaccine and pneumonia vaccine at least every 5 years.  See above for additional recommendations on exercise, sleep, stress management , and healthy social connections.      

## 2023-03-27 NOTE — Progress Notes (Signed)
03/27/2023  Endocrinology follow-up note  Subjective:    Patient ID: Michelle Skinner, female    DOB: 1965/09/22,    Past Medical History:  Diagnosis Date   Amputee, above knee, left (HCC)    Amputee, above knee, right (HCC)    Anemia    Cervical cancer (HCC)    Depression    Diabetes mellitus, type II (HCC)    GERD (gastroesophageal reflux disease)    Hyperlipidemia    Neuropathy    PTSD (post-traumatic stress disorder)    Past Surgical History:  Procedure Laterality Date   ABDOMINAL HYSTERECTOMY     AMPUTATION Bilateral 09/13/2020   Procedure: BILATERAL BELOW KNEE AMPUTATIONS;  Surgeon: Nadara Mustard, MD;  Location: [facility] OR;  Service: Orthopedics;  Laterality: Bilateral;   COLONOSCOPY WITH PROPOFOL N/A 05/25/2018   Procedure: COLONOSCOPY WITH PROPOFOL;  Surgeon: Corbin Ade, MD;  Location: [facility];  Service: Endoscopy;  Laterality: N/A;  7:30am   I & D EXTREMITY Bilateral 09/10/2020   Procedure: IRRIGATION AND DEBRIDEMENT FEET;;  Surgeon: Nadara Mustard, MD;  Location: [facility] OR;  Service: Orthopedics;  Laterality: Bilateral;   IRRIGATION AND DEBRIDEMENT FOOT Bilateral 09/08/2020   Procedure: IRRIGATION AND DEBRIDEMENT FOOT;  Surgeon: Lucretia Roers, MD;  Location: [facility] ORS;  Service: General;  Laterality: Bilateral;  left foot 11 x 10.5 x 1    OTHER SURGICAL HISTORY Right    R Foot- I&D   POLYPECTOMY  05/25/2018   Procedure: POLYPECTOMY;  Surgeon: Corbin Ade, MD;  Location: [facility];  Service: Endoscopy;;  colon   UMBILICAL HERNIA REPAIR  2007   Social History   Socioeconomic History   Marital status: Married    Spouse name: Not on file   Number of children: Not on file   Years of education: Not on file   Highest education level: Not on file  Occupational History   Not on file  Tobacco Use   Smoking status: Never   Smokeless tobacco: Never  Vaping Use   Vaping status: Never Used  Substance and Sexual Activity   Alcohol use: No    Alcohol/week: 0.0  standard drinks of alcohol   Drug use: No   Sexual activity: Yes    Birth control/protection: Surgical  Other Topics Concern   Not on file  Social History Narrative   Not on file   Social Determinants of Health   Financial Resource Strain: Not on file  Food Insecurity: Not on file  Transportation Needs: Not on file  Physical Activity: Not on file  Stress: Not on file  Social Connections: Not on file   Outpatient Encounter Medications as of 03/27/2023  Medication Sig   Ascorbic Acid (VITAMIN C PO) Take 1 tablet by mouth at bedtime.   atorvastatin (LIPITOR) 40 MG tablet Take 1 tablet (40 mg total) by mouth at bedtime.   Calcium Carbonate (CALCIUM 500 PO) Take 1 tablet by mouth daily.   carvedilol (COREG) 12.5 MG tablet Take 1 tablet (12.5 mg total) by mouth 2 (two) times daily.   Cholecalciferol (VITAMIN D3) 125 MCG (5000 UT) CAPS Take 1 capsule (5,000 Units total) by mouth daily.   Continuous Blood Gluc Receiver (FREESTYLE LIBRE 2 READER) DEVI As directed   Continuous Glucose Sensor (FREESTYLE LIBRE 2 SENSOR) MISC 1 Piece by Does not apply route every 14 (fourteen) days.   enoxaparin (LOVENOX) 80 MG/0.8ML injection Inject 0.7 mLs (70 mg total) into the skin daily.   Ferrous Sulfate (  IRON PO) Take 1 tablet by mouth at bedtime. (Patient not taking: Reported on 06/18/2022)   Ferrous Sulfate (IRON) 325 (65 Fe) MG TABS Take 1 tablet by mouth at bedtime. (Patient not taking: Reported on 06/18/2022)   FLUoxetine (PROZAC) 40 MG capsule Take 40 mg by mouth at bedtime.   folic acid (FOLVITE) 1 MG tablet Take 1 tablet (1 mg total) by mouth daily.   furosemide (LASIX) 40 MG tablet Take 40 mg by mouth 2 (two) times daily.   gabapentin (NEURONTIN) 300 MG capsule Take 1 capsule (300 mg total) by mouth 3 (three) times daily. (Patient not taking: Reported on 07/26/2021)   insulin glargine (LANTUS) 100 UNIT/ML injection Inject 0.8 mLs (80 Units total) into the skin at bedtime.   insulin lispro  (HUMALOG) 100 UNIT/ML KwikPen Inject 14-20 Units into the skin 3 (three) times daily before meals.   levothyroxine (SYNTHROID) 150 MCG tablet Take 1 tablet (150 mcg total) by mouth daily before breakfast.   lisinopril (ZESTRIL) 20 MG tablet Take 20 mg by mouth at bedtime.   magnesium citrate SOLN Take 296 mLs (1 Bottle total) by mouth daily as needed for severe constipation.   Multiple Vitamins-Minerals (PRESERVISION AREDS 2) CAPS Take 1 capsule by mouth 2 (two) times daily.   neomycin-bacitracin-polymyxin (NEOSPORIN) ointment Apply 1 application topically every 12 (twelve) hours. To legs   nutrition supplement, JUVEN, (JUVEN) PACK Take 1 packet by mouth 2 (two) times daily between meals.   ondansetron (ZOFRAN) 4 MG tablet Take 4 mg by mouth every 8 (eight) hours as needed for nausea or vomiting.   Oxycodone HCl 10 MG TABS Take 10 mg by mouth every 6 (six) hours as needed.   pantoprazole (PROTONIX) 40 MG tablet TAKE ONE (1) TABLET EACH DAY (Patient taking differently: Take 40 mg by mouth at bedtime.)   polyethylene glycol (MIRALAX / GLYCOLAX) 17 g packet Take 17 g by mouth 2 (two) times daily as needed for mild constipation.   promethazine (PHENERGAN) 25 MG suppository Place 1 suppository (25 mg total) rectally every 6 (six) hours as needed for nausea or vomiting.   senna-docusate (SENOKOT-S) 8.6-50 MG tablet Take 1 tablet by mouth 2 (two) times daily as needed for moderate constipation.   Thiamine HCl (VITAMIN B-1 PO) Take 1 tablet by mouth daily.   [DISCONTINUED] insulin glargine (LANTUS) 100 UNIT/ML injection Inject 0.7 mLs (70 Units total) into the skin at bedtime.   No facility-administered encounter medications on file as of 03/27/2023.   ALLERGIES: Allergies  Allergen Reactions   Sulfa Antibiotics Diarrhea, Nausea And Vomiting and Rash   Clindamycin/Lincomycin Diarrhea and Nausea And Vomiting   Invokana [Canagliflozin] Diarrhea and Nausea And Vomiting   Latex     Rash, itching, over a  long period of time   Ozempic (0.25 Or 0.5 Mg-Dose) [Semaglutide(0.25 Or 0.5mg -Dos)] Diarrhea and Nausea And Vomiting   Polyurethane [Urethane]     Blistering and swelling   Tape Itching and Swelling    Reaction after a few days use/ please use paper tape   Bactrim [Sulfamethoxazole-Trimethoprim] Diarrhea, Nausea And Vomiting and Rash   Cherry Itching, Swelling and Rash    Throat swelling   Gabapentin Diarrhea, Nausea And Vomiting and Rash   Other Itching, Swelling and Rash    Reaction to Hot peppers (throat swelling)   VACCINATION STATUS: Immunization History  Administered Date(s) Administered   Influenza Split 08/15/2014   Influenza,inj,Quad PF,6+ Mos 06/10/2017, 04/30/2020   Tdap 09/02/2014    Diabetes She  presents for her follow-up diabetic visit. She has type 2 diabetes mellitus. Onset time: She was diagnosed at approximate age of 36 years. 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 There are no hypoglycemic associated symptoms. Pertinent negatives for hypoglycemia include no confusion, headaches, pallor or seizures. Pertinent negatives for diabetes include no chest pain, no fatigue, no polydipsia, no polyphagia and no polyuria. There are no hypoglycemic complications. Symptoms are improving. Diabetic complications include PVD and retinopathy. Pertinent negatives for diabetic complications include no autonomic neuropathy or peripheral neuropathy. (She has bilateral Charcot's feet, bilateral lower extremity lymphedema.) Risk factors for coronary artery disease include diabetes mellitus, dyslipidemia, hypertension, obesity and sedentary lifestyle. Current diabetic treatment includes intensive insulin program. She is compliant with treatment most of the time. Her weight is increasing steadily. She is  following a generally unhealthy diet. When asked about meal planning, she reported none. She has not had a previous visit with a dietitian (She missed her appointment.). She never participates in exercise. Her home blood glucose trend is decreasing steadily. Her breakfast blood glucose range is generally 180-200 mg/dl. Her lunch blood glucose range is generally 180-200 mg/dl. Her dinner blood glucose range is generally 180-200 mg/dl. Her bedtime blood glucose range is generally 180-200 mg/dl. Her overall blood glucose range is 180-200 mg/dl. (She returns from her nursing home with her CGM device and logs.  She presents with improvement with point-of-care A1c of 8.6% compared to 9.5% during her last visit.  Her AGP report shows 16% time in range, 43% level 1 hyperglycemia, 41% level 2 hyperglycemia.  ) An ACE inhibitor/angiotensin II receptor blocker is not being taken. Eye exam is current.  Hyperlipidemia This is a chronic problem. The current episode started more than 1 year ago. The problem is uncontrolled. Recent lipid tests were reviewed and are high. Exacerbating diseases include diabetes, hypothyroidism and obesity. Pertinent negatives include no chest pain, myalgias or shortness of breath. She is currently on no antihyperlipidemic treatment. Risk factors for coronary artery disease include dyslipidemia, diabetes mellitus, obesity, a sedentary lifestyle, post-menopausal and hypertension.  Thyroid Problem Presents for initial visit. Patient reports no cold intolerance, diarrhea, fatigue, heat intolerance or palpitations. Past treatments include levothyroxine. The following procedures have not been performed: radioiodine uptake scan and thyroidectomy. Her past medical history is significant for diabetes and hyperlipidemia.    Review of systems   Objective:    BP 112/68   Pulse 64   Wt (!) 420 lb (190.5 kg)   BMI 65.78 kg/m   Wt Readings from Last 3 Encounters:  03/27/23 (!) 420 lb (190.5 kg)   03/29/21 (!) 333 lb (151 kg)  12/17/20 (!) 326 lb 1 oz (147.9 kg)     Physical Exam- Limited  Constitutional: On recliner.  She is status post bilateral below-knee amputation.   Psych: Reluctant affect.     Latest Ref Rng & Units 03/07/2022   12:00 AM 03/28/2021   12:00 AM 12/17/2020   11:31 PM  CMP  Glucose 70 - 99 mg/dL   829   BUN 4 - 21 21     22      38   Creatinine 0.5 - 1.1 1.1     1.0     1.19   Sodium 137 - 147 141      133   Potassium 3.5 - 5.1 mEq/L 4.5  3.5   Chloride 99 - 108 105      97   CO2 13 - 22 27      25    Calcium 8.7 - 10.7 8.6      9.1   Total Protein 6.5 - 8.1 g/dL   6.9   Total Bilirubin 0.3 - 1.2 mg/dL   1.1   Alkaline Phos 25 - 125 114      93   AST 13 - 35 18      15   ALT 7 - 35 U/L 14      18      This result is from an external source.      Diabetic Labs (most recent): Lab Results  Component Value Date   HGBA1C 8.6 (A) 03/27/2023   HGBA1C 9.5 (A) 12/25/2022   HGBA1C 8.4 (A) 09/17/2022   MICROALBUR 114.5 10/20/2019   MICROALBUR 126.9 11/18/2018   MICROALBUR 2.0 10/08/2017    Lipid Panel     Component Value Date/Time   CHOL 152 03/07/2022 0000   TRIG 163 (A) 03/07/2022 0000   HDL 36 03/07/2022 0000   CHOLHDL 4.5 07/03/2020 1001   VLDL 42 (H) 04/25/2015 1001   LDLCALC 83 03/07/2022 0000   LDLCALC 115 (H) 07/03/2020 1001    Assessment & Plan:   1. Uncontrolled type 2 diabetes mellitus with complication, with long-term current use of insulin (HCC)   Her diabetes is complicated by bilateral peripheral neuropathy , lateral Charcot's feet status post bilateral below-knee amputation due to complicated cellulitis/osteomyelitis, history of noncompliance/nonadherence, and possible retinopathy.  - She has chronically uncontrolled symptomatic type 2 DM of approximately since age 76 years duration.  She returns from her nursing home with her CGM device and logs.  She presents with improvement with point-of-care A1c of 8.6% compared  to 9.5% during her last visit.  Her AGP report shows 16% time in range, 43% level 1 hyperglycemia, 41% level 2 hyperglycemia.    -She remains at extremely high risk for more acute and chronic complications of diabetes which include CAD, CVA, CKD, retinopathy, and neuropathy. These are all discussed in detail with the patient.   - I have counseled the patient on diet management and weight loss, by adopting a carbohydrate restricted/protein rich diet.  - Patient is advised to stick to a routine mealtimes to eat 3 meals  a day and avoid unnecessary snacks ( to snack only to correct hypoglycemia).   - she acknowledges that there is a room for improvement in her food and drink choices. - Suggestion is made for her to avoid simple carbohydrates  from her diet including Cakes, Sweet Desserts, Ice Cream, Soda (diet and regular), Sweet Tea, Candies, Chips, Cookies, Store Bought Juices, Alcohol , Artificial Sweeteners,  Coffee Creamer, and "Sugar-free" Products, Lemonade. This will help patient to have more stable blood glucose profile and potentially avoid unintended weight gain.  The following Lifestyle Medicine recommendations according to American College of Lifestyle Medicine  [facility]) were discussed and and offered to patient and she  agrees to start the journey:  A. Whole Foods, Plant-Based Nutrition comprising of fruits and vegetables, plant-based proteins, whole-grain carbohydrates was discussed in detail with the patient.   A list for source of those nutrients were also provided to the patient.  Patient will use only water or unsweetened tea for hydration. B.  The need to stay away from risky substances including alcohol, smoking; obtaining 7 to 9 hours of restorative sleep, at  least 150 minutes of moderate intensity exercise weekly, the importance of healthy social connections,  and stress management techniques were discussed. C.  A full color page of  Calorie density of various food groups per pound  showing examples of each food groups was provided to the patient.   - I have approached patient with the following individualized plan to manage diabetes and patient agrees:   -In light of her presentation with significantly above target glycemic profile she will continue to need intensive treatment with basal/bolus insulin especially in light of the fact that she does not tolerate GLP-1 receptor agonist and she is not a candidate for SGLT2 inhibitors.    - Accordingly, I advised her to increase Lantus to 80 units nightly, continue Humalog 14 units 3 times daily AC for Premeal blood glucose readings above 90 mg per DL. - She is warned not to take insulin without monitoring blood glucose properly.   -Patient is encouraged to call clinic for blood glucose levels less than 70 or above 300 mg /dl. -She did not tolerate previous attempt on SGLT2 inhibitors nor  GLP-1 receptor agonist.  - Patient specific target  A1c;  LDL, HDL, Triglycerides were discussed in detail.  2) BP/HTN:  -Her blood pressure is controlled to target.   She is advised to take her blood pressure medications in the morning .  Advised to continue lisinopril 20 mg daily, carvedilol 25 mg p.o. twice daily, advised on salt restrictions.     3) Lipids/HPL: Most recent lipid panel showed LDL improving to 83.  She is advised to continue Lipitor 40 mg p.o. nightly.  Side effects and precautions discussed with her.    4) vitamin D deficiency:  -She is advised to continue vitamin D2 50,000 units weekly in addition to her regular maintenance dose of vitamin D3 5000 units daily.     5) morbid obesity-   She is morbidly obese, sedentary, a candidate for modest weight loss.  She cannot exercise optimally.  Whole Foods plant-based diet will help her achieve reasonable weight loss.  6) hypothyroidism:  -She does not have recent thyroid function tests.  She is advised to continue levothyroxine 150 mcg p.o. daily before breakfast.     -  We discussed about the correct intake of her thyroid hormone, on empty stomach at fasting, with water, separated by at least 30 minutes from breakfast and other medications,  and separated by more than 4 hours from calcium, iron, multivitamins, acid reflux medications (PPIs). -Patient is made aware of the fact that thyroid hormone replacement is needed for life, dose to be adjusted by periodic monitoring of thyroid function tests.  6) Chronic Care/Health Maintenance:  -Patient is advised to continue lisinopril, Lipitor. She is encouraged to continue to follow up with Ophthalmology, Podiatrist at least yearly or according to recommendations, and advised to stay away from smoking. I have recommended yearly flu vaccine and pneumonia vaccination at least every 5 years; moderate intensity exercise for up to 150 minutes weekly; and  sleep for at least 7 hours a day.   I advised patient to maintain close follow up with their PCP for primary care needs.   I spent  42  minutes in the care of the patient today including review of labs from CMP, Lipids, Thyroid Function, Hematology (current and previous including abstractions from other facilities); face-to-face time discussing  her blood glucose readings/logs, discussing hypoglycemia and hyperglycemia episodes and symptoms, medications doses, her options of short and long term  treatment based on the latest standards of care / guidelines;  discussion about incorporating lifestyle medicine;  and documenting the encounter. Risk reduction counseling performed per USPSTF guidelines to reduce  obesity and cardiovascular risk factors.     Please refer to Patient Instructions for Blood Glucose Monitoring and Insulin/Medications Dosing Guide"  in media tab for additional information. Please  also refer to " Patient Self Inventory" in the Media  tab for reviewed elements of pertinent patient history.  Michelle Skinner participated in the discussions, expressed  understanding, and voiced agreement with the above plans.  All questions were answered to her satisfaction. she is encouraged to contact clinic should she have any questions or concerns prior to her return visit.    Follow up plan: Return in about 4 months (around 07/27/2023) for F/U with Pre-visit Labs, Meter/CGM/Logs, A1c here.  Marquis Lunch, MD Phone: [phone]  Fax: [phone]  -  This note was partially dictated with voice recognition software. Similar sounding words can be transcribed inadequately or may not  be corrected upon review.  03/27/2023, 4:25 PM

## 2023-03-28 DIAGNOSIS — F411 Generalized anxiety disorder: Secondary | ICD-10-CM | POA: Diagnosis not present

## 2023-03-28 DIAGNOSIS — F331 Major depressive disorder, recurrent, moderate: Secondary | ICD-10-CM | POA: Diagnosis not present

## 2023-04-02 DIAGNOSIS — G894 Chronic pain syndrome: Secondary | ICD-10-CM | POA: Diagnosis not present

## 2023-04-04 DIAGNOSIS — E113513 Type 2 diabetes mellitus with proliferative diabetic retinopathy with macular edema, bilateral: Secondary | ICD-10-CM | POA: Diagnosis not present

## 2023-04-11 DIAGNOSIS — I2510 Atherosclerotic heart disease of native coronary artery without angina pectoris: Secondary | ICD-10-CM | POA: Diagnosis not present

## 2023-04-11 DIAGNOSIS — D509 Iron deficiency anemia, unspecified: Secondary | ICD-10-CM | POA: Diagnosis not present

## 2023-04-11 DIAGNOSIS — I509 Heart failure, unspecified: Secondary | ICD-10-CM | POA: Diagnosis not present

## 2023-04-15 DIAGNOSIS — F411 Generalized anxiety disorder: Secondary | ICD-10-CM | POA: Diagnosis not present

## 2023-04-15 DIAGNOSIS — F331 Major depressive disorder, recurrent, moderate: Secondary | ICD-10-CM | POA: Diagnosis not present

## 2023-04-16 DIAGNOSIS — H2513 Age-related nuclear cataract, bilateral: Secondary | ICD-10-CM | POA: Diagnosis not present

## 2023-04-16 DIAGNOSIS — E113293 Type 2 diabetes mellitus with mild nonproliferative diabetic retinopathy without macular edema, bilateral: Secondary | ICD-10-CM | POA: Diagnosis not present

## 2023-04-16 DIAGNOSIS — Z794 Long term (current) use of insulin: Secondary | ICD-10-CM | POA: Diagnosis not present

## 2023-04-16 DIAGNOSIS — E083513 Diabetes mellitus due to underlying condition with proliferative diabetic retinopathy with macular edema, bilateral: Secondary | ICD-10-CM | POA: Diagnosis not present

## 2023-04-16 DIAGNOSIS — H4313 Vitreous hemorrhage, bilateral: Secondary | ICD-10-CM | POA: Diagnosis not present

## 2023-04-18 DIAGNOSIS — F411 Generalized anxiety disorder: Secondary | ICD-10-CM | POA: Diagnosis not present

## 2023-04-18 DIAGNOSIS — F331 Major depressive disorder, recurrent, moderate: Secondary | ICD-10-CM | POA: Diagnosis not present

## 2023-04-23 ENCOUNTER — Encounter: Payer: Self-pay | Admitting: *Deleted

## 2023-04-25 DIAGNOSIS — F329 Major depressive disorder, single episode, unspecified: Secondary | ICD-10-CM | POA: Diagnosis not present

## 2023-04-29 ENCOUNTER — Other Ambulatory Visit: Payer: Self-pay

## 2023-04-29 DIAGNOSIS — E1159 Type 2 diabetes mellitus with other circulatory complications: Secondary | ICD-10-CM

## 2023-04-29 MED ORDER — FREESTYLE LIBRE 2 READER DEVI
0 refills | Status: AC
Start: 2023-04-29 — End: ?

## 2023-04-30 DIAGNOSIS — G894 Chronic pain syndrome: Secondary | ICD-10-CM | POA: Diagnosis not present

## 2023-05-06 ENCOUNTER — Telehealth: Payer: Self-pay

## 2023-05-06 NOTE — Telephone Encounter (Signed)
Pt had called the after hours line from her cell phone requesting a call back. She states her BG readings are high. Pt does not answer her phone when called back. Called [facility] and requested that they fax over her readings and current meds if any different.

## 2023-05-08 DIAGNOSIS — E860 Dehydration: Secondary | ICD-10-CM | POA: Diagnosis not present

## 2023-05-08 DIAGNOSIS — K529 Noninfective gastroenteritis and colitis, unspecified: Secondary | ICD-10-CM | POA: Diagnosis not present

## 2023-05-09 DIAGNOSIS — R1115 Cyclical vomiting syndrome unrelated to migraine: Secondary | ICD-10-CM | POA: Diagnosis not present

## 2023-05-09 DIAGNOSIS — K3184 Gastroparesis: Secondary | ICD-10-CM | POA: Diagnosis not present

## 2023-05-12 DIAGNOSIS — K3184 Gastroparesis: Secondary | ICD-10-CM | POA: Diagnosis not present

## 2023-05-12 DIAGNOSIS — R1115 Cyclical vomiting syndrome unrelated to migraine: Secondary | ICD-10-CM | POA: Diagnosis not present

## 2023-05-13 ENCOUNTER — Other Ambulatory Visit: Payer: Self-pay

## 2023-05-13 ENCOUNTER — Inpatient Hospital Stay (HOSPITAL_COMMUNITY)
Admission: EM | Admit: 2023-05-13 | Discharge: 2023-05-17 | DRG: 683 | Disposition: A | Discharge status: Skilled Nursing Facility | Payer: Medicare HMO | Source: Skilled Nursing Facility | Attending: Family Medicine | Admitting: Family Medicine

## 2023-05-13 ENCOUNTER — Encounter (HOSPITAL_COMMUNITY): Payer: Self-pay

## 2023-05-13 DIAGNOSIS — K3184 Gastroparesis: Secondary | ICD-10-CM | POA: Diagnosis present

## 2023-05-13 DIAGNOSIS — Z794 Long term (current) use of insulin: Secondary | ICD-10-CM | POA: Diagnosis not present

## 2023-05-13 DIAGNOSIS — E1143 Type 2 diabetes mellitus with diabetic autonomic (poly)neuropathy: Secondary | ICD-10-CM | POA: Diagnosis present

## 2023-05-13 DIAGNOSIS — K219 Gastro-esophageal reflux disease without esophagitis: Secondary | ICD-10-CM | POA: Diagnosis not present

## 2023-05-13 DIAGNOSIS — Z823 Family history of stroke: Secondary | ICD-10-CM

## 2023-05-13 DIAGNOSIS — Z89612 Acquired absence of left leg above knee: Secondary | ICD-10-CM

## 2023-05-13 DIAGNOSIS — R748 Abnormal levels of other serum enzymes: Secondary | ICD-10-CM | POA: Diagnosis not present

## 2023-05-13 DIAGNOSIS — E66813 Obesity, class 3: Secondary | ICD-10-CM | POA: Diagnosis present

## 2023-05-13 DIAGNOSIS — F4310 Post-traumatic stress disorder, unspecified: Secondary | ICD-10-CM | POA: Diagnosis present

## 2023-05-13 DIAGNOSIS — Z888 Allergy status to other drugs, medicaments and biological substances status: Secondary | ICD-10-CM | POA: Diagnosis not present

## 2023-05-13 DIAGNOSIS — A0471 Enterocolitis due to Clostridium difficile, recurrent: Secondary | ICD-10-CM | POA: Diagnosis not present

## 2023-05-13 DIAGNOSIS — Z79899 Other long term (current) drug therapy: Secondary | ICD-10-CM | POA: Diagnosis not present

## 2023-05-13 DIAGNOSIS — Z882 Allergy status to sulfonamides status: Secondary | ICD-10-CM

## 2023-05-13 DIAGNOSIS — F32A Depression, unspecified: Secondary | ICD-10-CM | POA: Diagnosis present

## 2023-05-13 DIAGNOSIS — Z6841 Body Mass Index (BMI) 40.0 and over, adult: Secondary | ICD-10-CM

## 2023-05-13 DIAGNOSIS — E782 Mixed hyperlipidemia: Secondary | ICD-10-CM | POA: Diagnosis not present

## 2023-05-13 DIAGNOSIS — E860 Dehydration: Secondary | ICD-10-CM | POA: Diagnosis present

## 2023-05-13 DIAGNOSIS — E11319 Type 2 diabetes mellitus with unspecified diabetic retinopathy without macular edema: Secondary | ICD-10-CM | POA: Diagnosis present

## 2023-05-13 DIAGNOSIS — Z833 Family history of diabetes mellitus: Secondary | ICD-10-CM

## 2023-05-13 DIAGNOSIS — N179 Acute kidney failure, unspecified: Secondary | ICD-10-CM | POA: Diagnosis not present

## 2023-05-13 DIAGNOSIS — Z89611 Acquired absence of right leg above knee: Secondary | ICD-10-CM | POA: Diagnosis not present

## 2023-05-13 DIAGNOSIS — E8721 Acute metabolic acidosis: Secondary | ICD-10-CM | POA: Diagnosis present

## 2023-05-13 DIAGNOSIS — R6889 Other general symptoms and signs: Secondary | ICD-10-CM | POA: Diagnosis not present

## 2023-05-13 DIAGNOSIS — Z881 Allergy status to other antibiotic agents status: Secondary | ICD-10-CM

## 2023-05-13 DIAGNOSIS — R946 Abnormal results of thyroid function studies: Secondary | ICD-10-CM | POA: Diagnosis not present

## 2023-05-13 DIAGNOSIS — Z90710 Acquired absence of both cervix and uterus: Secondary | ICD-10-CM

## 2023-05-13 DIAGNOSIS — Z91040 Latex allergy status: Secondary | ICD-10-CM

## 2023-05-13 DIAGNOSIS — R197 Diarrhea, unspecified: Secondary | ICD-10-CM | POA: Diagnosis not present

## 2023-05-13 DIAGNOSIS — I10 Essential (primary) hypertension: Secondary | ICD-10-CM | POA: Diagnosis not present

## 2023-05-13 DIAGNOSIS — Z8262 Family history of osteoporosis: Secondary | ICD-10-CM

## 2023-05-13 DIAGNOSIS — E1159 Type 2 diabetes mellitus with other circulatory complications: Secondary | ICD-10-CM | POA: Diagnosis not present

## 2023-05-13 DIAGNOSIS — E039 Hypothyroidism, unspecified: Secondary | ICD-10-CM | POA: Diagnosis present

## 2023-05-13 DIAGNOSIS — R195 Other fecal abnormalities: Secondary | ICD-10-CM | POA: Diagnosis not present

## 2023-05-13 DIAGNOSIS — Z743 Need for continuous supervision: Secondary | ICD-10-CM | POA: Diagnosis not present

## 2023-05-13 DIAGNOSIS — Z8541 Personal history of malignant neoplasm of cervix uteri: Secondary | ICD-10-CM

## 2023-05-13 DIAGNOSIS — E669 Obesity, unspecified: Secondary | ICD-10-CM | POA: Diagnosis present

## 2023-05-13 DIAGNOSIS — Z79890 Hormone replacement therapy (postmenopausal): Secondary | ICD-10-CM

## 2023-05-13 DIAGNOSIS — Z7401 Bed confinement status: Secondary | ICD-10-CM | POA: Diagnosis not present

## 2023-05-13 DIAGNOSIS — Z83438 Family history of other disorder of lipoprotein metabolism and other lipidemia: Secondary | ICD-10-CM

## 2023-05-13 DIAGNOSIS — R7989 Other specified abnormal findings of blood chemistry: Secondary | ICD-10-CM | POA: Diagnosis present

## 2023-05-13 DIAGNOSIS — R5381 Other malaise: Secondary | ICD-10-CM | POA: Diagnosis not present

## 2023-05-13 DIAGNOSIS — R7889 Finding of other specified substances, not normally found in blood: Secondary | ICD-10-CM | POA: Diagnosis not present

## 2023-05-13 DIAGNOSIS — Z8249 Family history of ischemic heart disease and other diseases of the circulatory system: Secondary | ICD-10-CM | POA: Diagnosis not present

## 2023-05-13 DIAGNOSIS — L03115 Cellulitis of right lower limb: Secondary | ICD-10-CM | POA: Diagnosis not present

## 2023-05-13 LAB — GLUCOSE, CAPILLARY: Glucose-Capillary: 263 mg/dL — ABNORMAL HIGH (ref 70–99)

## 2023-05-13 LAB — URINALYSIS, W/ REFLEX TO CULTURE (INFECTION SUSPECTED)
Bilirubin Urine: NEGATIVE
Glucose, UA: NEGATIVE mg/dL
Hgb urine dipstick: NEGATIVE
Ketones, ur: NEGATIVE mg/dL
Nitrite: NEGATIVE
Protein, ur: NEGATIVE mg/dL
Specific Gravity, Urine: 1.008 (ref 1.005–1.030)
pH: 5 (ref 5.0–8.0)

## 2023-05-13 LAB — CBC WITH DIFFERENTIAL/PLATELET
Abs Immature Granulocytes: 0.05 10*3/uL (ref 0.00–0.07)
Basophils Absolute: 0 10*3/uL (ref 0.0–0.1)
Basophils Relative: 0 %
Eosinophils Absolute: 0.2 10*3/uL (ref 0.0–0.5)
Eosinophils Relative: 3 %
HCT: 32.9 % — ABNORMAL LOW (ref 36.0–46.0)
Hemoglobin: 10.7 g/dL — ABNORMAL LOW (ref 12.0–15.0)
Immature Granulocytes: 1 %
Lymphocytes Relative: 18 %
Lymphs Abs: 1.2 10*3/uL (ref 0.7–4.0)
MCH: 30.6 pg (ref 26.0–34.0)
MCHC: 32.5 g/dL (ref 30.0–36.0)
MCV: 94 fL (ref 80.0–100.0)
Monocytes Absolute: 0.5 10*3/uL (ref 0.1–1.0)
Monocytes Relative: 8 %
Neutro Abs: 4.7 10*3/uL (ref 1.7–7.7)
Neutrophils Relative %: 70 %
Platelets: 332 10*3/uL (ref 150–400)
RBC: 3.5 MIL/uL — ABNORMAL LOW (ref 3.87–5.11)
RDW: 12.3 % (ref 11.5–15.5)
WBC: 6.6 10*3/uL (ref 4.0–10.5)
nRBC: 0 % (ref 0.0–0.2)

## 2023-05-13 LAB — COMPREHENSIVE METABOLIC PANEL
ALT: 14 U/L (ref 0–44)
AST: 14 U/L — ABNORMAL LOW (ref 15–41)
Albumin: 3.2 g/dL — ABNORMAL LOW (ref 3.5–5.0)
Alkaline Phosphatase: 105 U/L (ref 38–126)
Anion gap: 13 (ref 5–15)
BUN: 98 mg/dL — ABNORMAL HIGH (ref 6–20)
CO2: 18 mmol/L — ABNORMAL LOW (ref 22–32)
Calcium: 8.4 mg/dL — ABNORMAL LOW (ref 8.9–10.3)
Chloride: 103 mmol/L (ref 98–111)
Creatinine, Ser: 4.43 mg/dL — ABNORMAL HIGH (ref 0.44–1.00)
GFR, Estimated: 11 mL/min — ABNORMAL LOW (ref >60)
Glucose, Bld: 229 mg/dL — ABNORMAL HIGH (ref 70–99)
Potassium: 4.3 mmol/L (ref 3.5–5.1)
Sodium: 134 mmol/L — ABNORMAL LOW (ref 135–145)
Total Bilirubin: 0.4 mg/dL (ref <1.2)
Total Protein: 7.3 g/dL (ref 6.5–8.1)

## 2023-05-13 LAB — BASIC METABOLIC PANEL
Anion gap: 12 (ref 5–15)
BUN: 90 mg/dL — ABNORMAL HIGH (ref 6–20)
CO2: 18 mmol/L — ABNORMAL LOW (ref 22–32)
Calcium: 8.6 mg/dL — ABNORMAL LOW (ref 8.9–10.3)
Chloride: 107 mmol/L (ref 98–111)
Creatinine, Ser: 4.03 mg/dL — ABNORMAL HIGH (ref 0.44–1.00)
GFR, Estimated: 12 mL/min — ABNORMAL LOW (ref >60)
Glucose, Bld: 259 mg/dL — ABNORMAL HIGH (ref 70–99)
Potassium: 4.5 mmol/L (ref 3.5–5.1)
Sodium: 137 mmol/L (ref 135–145)

## 2023-05-13 LAB — CBG MONITORING, ED: Glucose-Capillary: 226 mg/dL — ABNORMAL HIGH (ref 70–99)

## 2023-05-13 LAB — MAGNESIUM: Magnesium: 1.9 mg/dL (ref 1.7–2.4)

## 2023-05-13 MED ORDER — CARVEDILOL 12.5 MG PO TABS
12.5000 mg | ORAL_TABLET | Freq: Two times a day (BID) | ORAL | Status: DC
  Administered 2023-05-13 – 2023-05-17 (×8): 12.5 mg via ORAL
  Filled 2023-05-13 (×8): qty 1

## 2023-05-13 MED ORDER — INSULIN ASPART 100 UNIT/ML IJ SOLN
0.0000 [IU] | Freq: Three times a day (TID) | INTRAMUSCULAR | Status: DC
Start: 2023-05-14 — End: 2023-05-17
  Administered 2023-05-14 (×2): 8 [IU] via SUBCUTANEOUS
  Administered 2023-05-14 – 2023-05-15 (×2): 3 [IU] via SUBCUTANEOUS
  Administered 2023-05-15 (×2): 8 [IU] via SUBCUTANEOUS
  Administered 2023-05-16: 5 [IU] via SUBCUTANEOUS
  Administered 2023-05-16: 3 [IU] via SUBCUTANEOUS
  Administered 2023-05-16: 8 [IU] via SUBCUTANEOUS
  Administered 2023-05-17: 5 [IU] via SUBCUTANEOUS
  Administered 2023-05-17: 8 [IU] via SUBCUTANEOUS

## 2023-05-13 MED ORDER — OXYCODONE HCL 5 MG PO TABS
5.0000 mg | ORAL_TABLET | ORAL | Status: DC | PRN
  Administered 2023-05-15: 5 mg via ORAL
  Filled 2023-05-13: qty 1

## 2023-05-13 MED ORDER — INSULIN ASPART 100 UNIT/ML IJ SOLN
0.0000 [IU] | Freq: Every day | INTRAMUSCULAR | Status: DC
Start: 2023-05-13 — End: 2023-05-17
  Administered 2023-05-13: 3 [IU] via SUBCUTANEOUS
  Administered 2023-05-14 – 2023-05-15 (×2): 2 [IU] via SUBCUTANEOUS
  Administered 2023-05-16: 3 [IU] via SUBCUTANEOUS

## 2023-05-13 MED ORDER — HEPARIN SODIUM (PORCINE) 5000 UNIT/ML IJ SOLN
5000.0000 [IU] | Freq: Three times a day (TID) | INTRAMUSCULAR | Status: DC
Start: 2023-05-13 — End: 2023-05-17
  Administered 2023-05-13 – 2023-05-17 (×11): 5000 [IU] via SUBCUTANEOUS
  Filled 2023-05-13 (×11): qty 1

## 2023-05-13 MED ORDER — SODIUM CHLORIDE 0.9 % IV SOLN: INTRAVENOUS | Status: AC

## 2023-05-13 MED ORDER — ACETAMINOPHEN 650 MG RE SUPP: 650.0000 mg | Freq: Four times a day (QID) | RECTAL | Status: DC | PRN

## 2023-05-13 MED ORDER — ONDANSETRON HCL 4 MG/2ML IJ SOLN
4.0000 mg | Freq: Four times a day (QID) | INTRAMUSCULAR | Status: DC | PRN
  Administered 2023-05-15: 4 mg via INTRAVENOUS
  Filled 2023-05-13: qty 2

## 2023-05-13 MED ORDER — MORPHINE SULFATE (PF) 2 MG/ML IV SOLN: 2.0000 mg | INTRAVENOUS | Status: DC | PRN

## 2023-05-13 MED ORDER — INSULIN DETEMIR 100 UNIT/ML ~~LOC~~ SOLN
60.0000 [IU] | Freq: Every day | SUBCUTANEOUS | Status: DC
  Administered 2023-05-13 – 2023-05-16 (×4): 60 [IU] via SUBCUTANEOUS
  Filled 2023-05-13 (×5): qty 0.6

## 2023-05-13 MED ORDER — METOCLOPRAMIDE HCL 5 MG/ML IJ SOLN
10.0000 mg | Freq: Once | INTRAMUSCULAR | Status: AC
  Administered 2023-05-13: 10 mg via INTRAVENOUS
  Filled 2023-05-13: qty 2

## 2023-05-13 MED ORDER — LACTATED RINGERS IV BOLUS
1000.0000 mL | Freq: Once | INTRAVENOUS | Status: AC
  Administered 2023-05-13: 1000 mL via INTRAVENOUS

## 2023-05-13 MED ORDER — ATORVASTATIN CALCIUM 40 MG PO TABS
80.0000 mg | ORAL_TABLET | Freq: Every day | ORAL | Status: DC
  Administered 2023-05-13 – 2023-05-16 (×4): 80 mg via ORAL
  Filled 2023-05-13 (×4): qty 2

## 2023-05-13 MED ORDER — ACETAMINOPHEN 325 MG PO TABS
650.0000 mg | ORAL_TABLET | Freq: Four times a day (QID) | ORAL | Status: DC | PRN
  Administered 2023-05-16 – 2023-05-17 (×2): 650 mg via ORAL
  Filled 2023-05-13 (×2): qty 2

## 2023-05-13 MED ORDER — ATORVASTATIN CALCIUM 40 MG PO TABS: 40.0000 mg | ORAL_TABLET | Freq: Every day | ORAL | Status: DC

## 2023-05-13 MED ORDER — LEVOTHYROXINE SODIUM 75 MCG PO TABS
150.0000 ug | ORAL_TABLET | Freq: Every day | ORAL | Status: DC
  Administered 2023-05-14 – 2023-05-17 (×4): 150 ug via ORAL
  Filled 2023-05-13 (×4): qty 2

## 2023-05-13 MED ORDER — ONDANSETRON HCL 4 MG PO TABS: 4.0000 mg | ORAL_TABLET | Freq: Four times a day (QID) | ORAL | Status: DC | PRN

## 2023-05-13 NOTE — Assessment & Plan Note (Signed)
-  Hold lisinopril in the setting of AKI -Continue coreg

## 2023-05-13 NOTE — Assessment & Plan Note (Signed)
Continue statin. 

## 2023-05-13 NOTE — Assessment & Plan Note (Signed)
-  Cr at baseline 1.1>> 4.43 -Hold nephrotoxic agents when possible -Continue fluids -continue to monitor

## 2023-05-13 NOTE — ED Provider Notes (Signed)
[place] EMERGENCY DEPARTMENT AT [facility] Provider Note   CSN: [id number] Arrival date & time: 05/13/23  1254     History  Chief Complaint  Patient presents with   Labs Only    Michelle Skinner is a 57 y.o. female.  HPI      Michelle Skinner is a 57 y.o. female with past medical history of type 2 diabetes, GERD, HTN, bilateral AKA's, morbid obesity who was sent here from [facility] for evaluation of elevated BUN.  She states that she has had nausea vomiting and diarrhea 10 to 11 days ago.  States symptoms have been improving.  Now able to tolerate liquids and small amounts of food.  Had labs done at the facility yesterday and was notified this morning that he had abnormal lab and was sent here for further evaluation.  Patient states that she is feeling better.  Denies any chest pain or abdominal pain at present.  Believes that her previous symptoms were secondary to  gastroparesis.  Home Medications Prior to Admission medications   Medication Sig Start Date End Date Taking? Authorizing Provider  Ascorbic Acid (VITAMIN C PO) Take 1 tablet by mouth at bedtime.    [provider]  atorvastatin (LIPITOR) 40 MG tablet Take 1 tablet (40 mg total) by mouth at bedtime. 07/12/20   [name], MD  Calcium Carbonate (CALCIUM 500 PO) Take 1 tablet by mouth daily.    [provider]  carvedilol (COREG) 12.5 MG tablet Take 1 tablet (12.5 mg total) by mouth 2 (two) times daily. 09/17/20 09/17/21  [name], MD  Cholecalciferol (VITAMIN D3) 125 MCG (5000 UT) CAPS Take 1 capsule (5,000 Units total) by mouth daily. 05/13/18   [name], MD  Continuous Glucose Receiver (FREESTYLE LIBRE 2 READER) DEVI As directed 04/29/23   [name], MD  Continuous Glucose Sensor (FREESTYLE LIBRE 2 SENSOR) MISC 1 Piece by Does not apply route every 14 (fourteen) days. 12/25/22   [name], MD  enoxaparin (LOVENOX) 80 MG/0.8ML injection  Inject 0.7 mLs (70 mg total) into the skin daily. 09/17/20 10/17/20  [name], MD  Ferrous Sulfate (IRON PO) Take 1 tablet by mouth at bedtime. Patient not taking: Reported on 06/18/2022    [provider]  Ferrous Sulfate (IRON) 325 (65 Fe) MG TABS Take 1 tablet by mouth at bedtime. Patient not taking: Reported on 06/18/2022    [provider]  FLUoxetine (PROZAC) 40 MG capsule Take 40 mg by mouth at bedtime.    [provider]  folic acid (FOLVITE) 1 MG tablet Take 1 tablet (1 mg total) by mouth daily. 09/17/20   [name], MD  furosemide (LASIX) 40 MG tablet Take 40 mg by mouth 2 (two) times daily. 06/10/22   [provider]  gabapentin (NEURONTIN) 300 MG capsule Take 1 capsule (300 mg total) by mouth 3 (three) times daily. Patient not taking: Reported on 07/26/2021 03/14/21   Adonis Huguenin, NP  insulin glargine (LANTUS) 100 UNIT/ML injection Inject 0.8 mLs (80 Units total) into the skin at bedtime. 03/27/23   [name], MD  insulin lispro (HUMALOG) 100 UNIT/ML KwikPen Inject 14-20 Units into the skin 3 (three) times daily before meals.    [provider]  levothyroxine (SYNTHROID) 150 MCG tablet Take 1 tablet (150 mcg total) by mouth daily before breakfast. 07/12/20   [name], [name], MD  lisinopril (ZESTRIL) 20 MG tablet  Take 20 mg by mouth at bedtime. 06/29/20   [provider]  magnesium citrate SOLN Take 296 mLs (1 Bottle total) by mouth daily as needed for severe constipation. 09/17/20   [name], MD  Multiple Vitamins-Minerals (PRESERVISION AREDS 2) CAPS Take 1 capsule by mouth 2 (two) times daily.    [provider]  neomycin-bacitracin-polymyxin (NEOSPORIN) ointment Apply 1 application topically every 12 (twelve) hours. To legs    [provider]  nutrition supplement, JUVEN, (JUVEN) PACK Take 1 packet by mouth 2 (two) times daily between meals. 09/17/20   [name], MD  ondansetron (ZOFRAN)  4 MG tablet Take 4 mg by mouth every 8 (eight) hours as needed for nausea or vomiting.    [provider]  Oxycodone HCl 10 MG TABS Take 10 mg by mouth every 6 (six) hours as needed.    [provider]  pantoprazole (PROTONIX) 40 MG tablet TAKE ONE (1) TABLET EACH DAY Patient taking differently: Take 40 mg by mouth at bedtime. 12/01/18   [name], MD  polyethylene glycol (MIRALAX / GLYCOLAX) 17 g packet Take 17 g by mouth 2 (two) times daily as needed for mild constipation. 09/17/20   [name], MD  promethazine (PHENERGAN) 25 MG suppository Place 1 suppository (25 mg total) rectally every 6 (six) hours as needed for nausea or vomiting. 12/18/20   Mesner, Barbara Cower, MD  senna-docusate (SENOKOT-S) 8.6-50 MG tablet Take 1 tablet by mouth 2 (two) times daily as needed for moderate constipation. 09/17/20   [name], MD  Thiamine HCl (VITAMIN B-1 PO) Take 1 tablet by mouth daily.    [provider]      Allergies    Sulfa antibiotics, Clindamycin/lincomycin, Coenzyme q10-black pepper, Invokana [canagliflozin], Latex, Ozempic (0.25 or 0.5 mg-dose) [semaglutide(0.25 or 0.5mg -dos)], Polyurethane [urethane], Tape, Bactrim [sulfamethoxazole-trimethoprim], Cherry, Gabapentin, and Other    Review of Systems   Review of Systems  Constitutional:  Negative for appetite change, chills and fever.  HENT:  Negative for congestion.   Respiratory:  Negative for cough and shortness of breath.   Cardiovascular:  Negative for chest pain.  Gastrointestinal:  Negative for abdominal pain, blood in stool, diarrhea, nausea and vomiting.  Musculoskeletal:  Negative for back pain and neck pain.  Neurological:  Negative for dizziness, weakness and numbness.  Psychiatric/Behavioral:  Negative for confusion.     Physical Exam Updated Vital Signs BP (!) 141/49 (BP Location: Right Wrist)   Pulse 69   Temp 97.8 F (36.6 C) (Oral)   Resp 18   Ht 5\' 7"  (1.702 m)   Wt (!) 192.8 kg    SpO2 100%   BMI 66.56 kg/m  Physical Exam Vitals and nursing note reviewed.  Constitutional:      General: She is not in acute distress.    Appearance: Normal appearance. She is obese. She is not ill-appearing or toxic-appearing.  HENT:     Mouth/Throat:     Mouth: Mucous membranes are moist.  Eyes:     Conjunctiva/sclera: Conjunctivae normal.  Cardiovascular:     Rate and Rhythm: Normal rate and regular rhythm.     Pulses: Normal pulses.  Pulmonary:     Effort: Pulmonary effort is normal.  Abdominal:     Palpations: Abdomen is soft.     Tenderness: There is no abdominal tenderness. There is no guarding.  Musculoskeletal:     Comments: Bilateral AKA's  Skin:    General: Skin is warm.  Capillary Refill: Capillary refill takes less than 2 seconds.  Neurological:     General: No focal deficit present.     Mental Status: She is alert.     Sensory: No sensory deficit.     Motor: No weakness.     ED Results / Procedures / Treatments   Labs (all labs ordered are listed, but only abnormal results are displayed) Labs Reviewed  CBC WITH DIFFERENTIAL/PLATELET - Abnormal; Notable for the following components:      Result Value   RBC 3.50 (*)    Hemoglobin 10.7 (*)    HCT 32.9 (*)    All other components within normal limits  COMPREHENSIVE METABOLIC PANEL - Abnormal; Notable for the following components:   Sodium 134 (*)    CO2 18 (*)    Glucose, Bld 229 (*)    BUN 98 (*)    Creatinine, Ser 4.43 (*)    Calcium 8.4 (*)    Albumin 3.2 (*)    AST 14 (*)    GFR, Estimated 11 (*)    All other components within normal limits  URINALYSIS, W/ REFLEX TO CULTURE (INFECTION SUSPECTED)  MAGNESIUM  CBG MONITORING, ED    EKG None  Radiology No results found.  Procedures Procedures    Medications Ordered in ED Medications - No data to display  ED Course/ Medical Decision Making/ A&P                                 Medical Decision Making Patient here for [facility] for evaluation of elevated BUN.  Notes history of vomiting diarrhea greater than 1 week ago.  States her symptoms have now improved.  She denies having any chest pain or abdominal pain no further vomiting or diarrhea.  States the facility checked her labs yesterday and was sent here for further evaluation of elevated kidney function.  Patient denies any symptoms at present.  No abdominal pain on my exam.  Patient well-appearing.  Vital signs reviewed.  Will check labs  Amount and/or Complexity of Data Reviewed External Data Reviewed: notes.    Details: No recent lab values available in Care Everywhere. Labs: ordered.    Details: Labs interpreted by me, no evidence of leukocytosis, chemistries show blood glucose of 229, BUN elevated at 98, serum creatinine 4.43.  No recent values available for comparison.  Anion gap reassuring at 13.  Magnesium unremarkable ECG/medicine tests: ordered.    Details: EKG shows sinus rhythm with borderline prolonged PR interval nonspecific IVCD with LAD Discussion of management or test interpretation with external provider(s): Patient here with AKI.  Notes history of vomiting diarrhea over a week ago.  GI symptoms have improved sent here from SNF facility for elevated kidney values.  Found to have AKI.  No recent labs available for comparison, last serum creatinine 1.1 over 1-year ago.  Discussed with [facility] hospitalist, who agrees to admit           Final Clinical Impression(s) / ED Diagnoses Final diagnoses:  AKI (acute kidney injury) [facility])    Rx / DC Orders ED Discharge Orders     None         Pauline Aus, PA-C 05/13/23 2245    Glendora Score, MD 05/14/23 [phone]

## 2023-05-13 NOTE — Assessment & Plan Note (Signed)
Hold PPI in the setting of AKI

## 2023-05-13 NOTE — H&P (Signed)
History and Physical    Patient: Michelle Skinner ZOX:[id number] DOB: 01-06-1966 DOA: 05/13/2023 DOS: the patient was seen and examined on 05/13/2023 PCP: Pcp, No  Patient coming from: SNF  Chief Complaint:  Chief Complaint  Patient presents with   Labs Only   HPI: Michelle Skinner is a 57 y.o. female with medical history significant of bilateral AKA, depression, GERD, diabetes mellitus type 2, hyperlipidemia, and more presents to the ER with a chief complaint of vomiting and diarrhea.  Patient reports that she has had 12 days of vomiting and diarrhea.  She later says she has not had any vomiting or diarrhea today, which would make it 11 days.  She reports the nausea and vomiting stopped the day before yesterday.  Before that she was having it 3 times per day.  She denies hematemesis.  She reports anytime she had liquid or solid p.o. intake she then threw it back up.  She reports that the vomit looked like white foam.  She has a history of gastroparesis and reports that this felt just like her gastroparesis.  She has abdominal pain and its associated with it.  The pain is periumbilical.  It feels like bloating and like hardness.  She reports sometimes it is like a stinging feeling.  She reports the pain, eased up, but then it became like a gassy pain.  She reports she has been having diarrhea.  She thinks she is having diarrhea 6 times per hour.  She denies hematochezia and melena.  She reports that the diarrhea appeared like yellow chocolate milk.  There was no mucus.  She reports she always has diarrhea when she has gastroparesis so she still thinks it is that.  At the SNF they told her that it was a virus and that LAD down the hall had the same symptoms.  She does not believe this to be a virus.  Patient reports that they started her on Zofran and Imodium at the SNF and it did not help.  She reports that she had no relief from the pain with either of those treatments.  She has no fever to her knowledge.   Since being in the ER and getting treatment she reports her pain is better, she has not had any nausea or vomiting here, but she is starting to feel bloated again.  Patient reports that she has had clear urine output, without hematuria, dysuria.  She reports no decrease in her urine output.  She does report that she has felt dehydrated because she cannot keep fluids down.  Patient has no other complaints at this time.  Patient may be interested in changing facilities.  She is very unhappy with the facility she is at and with the therapists there.  She reports that the therapist only sees her 15 minutes 1 time per month and she does not think that adequate to be managing her medications.  She was on Wellbutrin and Zoloft before she like that combination, now she is on Lamictal per her report.  Patient has no other complaints at this time.  She does not smoke she does not drink.  Patient reports that she is full code. Review of Systems: As mentioned in the history of present illness. All other systems reviewed and are negative. Past Medical History:  Diagnosis Date   Amputee, above knee, left (HCC)    Amputee, above knee, right (HCC)    Anemia    Cervical cancer (HCC)    Depression  Diabetes mellitus, type II (HCC)    GERD (gastroesophageal reflux disease)    Hyperlipidemia    Neuropathy    PTSD (post-traumatic stress disorder)    Past Surgical History:  Procedure Laterality Date   ABDOMINAL HYSTERECTOMY     AMPUTATION Bilateral 09/13/2020   Procedure: BILATERAL BELOW KNEE AMPUTATIONS;  Surgeon: Nadara Mustard, MD;  Location: [facility] OR;  Service: Orthopedics;  Laterality: Bilateral;   COLONOSCOPY WITH PROPOFOL N/A 05/25/2018   Procedure: COLONOSCOPY WITH PROPOFOL;  Surgeon: Corbin Ade, MD;  Location: [facility] ENDO SUITE;  Service: Endoscopy;  Laterality: N/A;  7:30am   I & D EXTREMITY Bilateral 09/10/2020   Procedure: IRRIGATION AND DEBRIDEMENT FEET;;  Surgeon: Nadara Mustard, MD;  Location: [facility] OR;   Service: Orthopedics;  Laterality: Bilateral;   IRRIGATION AND DEBRIDEMENT FOOT Bilateral 09/08/2020   Procedure: IRRIGATION AND DEBRIDEMENT FOOT;  Surgeon: Lucretia Roers, MD;  Location: [facility] ORS;  Service: General;  Laterality: Bilateral;  left foot 11 x 10.5 x 1    OTHER SURGICAL HISTORY Right    R Foot- I&D   POLYPECTOMY  05/25/2018   Procedure: POLYPECTOMY;  Surgeon: Corbin Ade, MD;  Location: [facility] ENDO SUITE;  Service: Endoscopy;;  colon   UMBILICAL HERNIA REPAIR  2007   Social History:  reports that she has never smoked. She has never used smokeless tobacco. She reports that she does not drink alcohol and does not use drugs.  Allergies  Allergen Reactions   Sulfa Antibiotics Diarrhea, Nausea And Vomiting and Rash   Canagliflozin Diarrhea, Nausea And Vomiting and Other (See Comments)    Unknown   Clindamycin/Lincomycin Diarrhea and Nausea And Vomiting   Coenzyme Q10-Black Pepper    Latex     Rash, itching, over a long period of time   Metformin And Related Diarrhea and Nausea And Vomiting   Ozempic (0.25 Or 0.5 Mg-Dose) [Semaglutide(0.25 Or 0.5mg -Dos)] Diarrhea and Nausea And Vomiting   Polyurethane [Urethane]     Blistering and swelling   Tape Itching and Swelling    Reaction after a few days use/ please use paper tape   Cherry Itching, Rash, Swelling and Other (See Comments)    Throat swelling  Unknown    Throat swelling   Gabapentin Diarrhea, Nausea And Vomiting and Rash    Unknown   Other Itching, Swelling and Rash    Reaction to Hot peppers (throat swelling)   Sulfamethoxazole-Trimethoprim Diarrhea, Nausea And Vomiting, Rash and Itching    Family History  Problem Relation Age of Onset   Hypertension Mother    Diabetes Father    Hyperlipidemia Father    CAD Father    Stroke Father    Osteoporosis Maternal Grandmother    Cancer Maternal Grandmother    Hypertension Maternal Grandmother    Colon cancer Neg Hx     Prior to Admission medications    Medication Sig Start Date End Date Taking? Authorizing Provider  acetaminophen (TYLENOL) 325 MG tablet Take 650 mg by mouth every 4 (four) hours as needed for mild pain (pain score 1-3).   Yes [provider]  Ascorbic Acid (VITAMIN C PO) Take 1 tablet by mouth at bedtime.   Yes [provider]  atorvastatin (LIPITOR) 80 MG tablet Take 80 mg by mouth daily. 04/11/23  Yes [provider]  buPROPion ER (WELLBUTRIN SR) 100 MG 12 hr tablet Take 100 mg by mouth every morning.   Yes [provider]  Calcium Carbonate (CALCIUM 500 PO) Take  1 tablet by mouth daily.   Yes [provider]  carvedilol (COREG) 12.5 MG tablet Take 1 tablet (12.5 mg total) by mouth 2 (two) times daily. 09/17/20 05/13/23 Yes [name], MD  Cholecalciferol (VITAMIN D3) 125 MCG (5000 UT) CAPS Take 1 capsule (5,000 Units total) by mouth daily. 05/13/18  Yes [name], [name], MD  cloNIDine (CATAPRES) 0.1 MG tablet Take 0.1 mg by mouth at bedtime.   Yes [provider]  diphenhydrAMINE (BENADRYL) 25 mg capsule Take 25 mg by mouth daily as needed (allergic reactions).   Yes [provider]  docusate sodium (COLACE) 100 MG capsule Take 100 mg by mouth every 8 (eight) hours as needed for mild constipation.   Yes [provider]  Ferrous Sulfate (IRON) 325 (65 Fe) MG TABS Take 1 tablet by mouth at bedtime.   Yes [provider]  folic acid (FOLVITE) 1 MG tablet Take 1 tablet (1 mg total) by mouth daily. 09/17/20  Yes [name], MD  hydroxypropyl methylcellulose / hypromellose (ISOPTO TEARS / GONIOVISC) 2.5 % ophthalmic solution Place 1-2 drops into both eyes every 8 (eight) hours as needed for dry eyes (diabetic retinopathy). 1 drop for dry eyes and 2 drops for diabetic retinopathy   Yes [provider]  lamoTRIgine (LAMICTAL) 25 MG tablet Take 25 mg by mouth 2 (two) times daily.   Yes [provider]  levothyroxine (SYNTHROID) 150  MCG tablet Take 1 tablet (150 mcg total) by mouth daily before breakfast. 07/12/20  Yes [name], [name], MD  lisinopril (ZESTRIL) 20 MG tablet Take 20 mg by mouth at bedtime. 06/29/20  Yes [provider]  loperamide (IMODIUM) 2 MG capsule Take 2 mg by mouth as needed for diarrhea or loose stools. 04/20/23  Yes [provider]  magnesium citrate SOLN Take 296 mLs (1 Bottle total) by mouth daily as needed for severe constipation. Patient taking differently: Take 1 Bottle by mouth every 8 (eight) hours as needed (laxative). 09/17/20  Yes [name], MD  metoCLOPramide (REGLAN) 10 MG tablet Take 10 mg by mouth every 6 (six) hours as needed for nausea or vomiting.   Yes [provider]  naloxone (NARCAN) nasal spray 4 mg/0.1 mL Place 1 spray into the nose once.   Yes [provider]  NOVOLOG FLEXPEN 100 UNIT/ML FlexPen Inject 1-14 Units into the skin 6 (six) times daily. Inject 14 units three times a day 10 minutes before meals, then 0-8 units with meals depending on blood sugar reading. 151-200 = 1 201-250 = 2 251-300 = 4 301-350 = 6 351-400 = 8 05/08/23  Yes [provider]  nystatin (MYCOSTATIN/NYSTOP) powder Apply 1 Application topically 3 (three) times daily. Under breasts for redness 03/13/23  Yes [provider]  omega-3 acid ethyl esters (LOVAZA) 1 g capsule Take 1 capsule by mouth daily.   Yes [provider]  ondansetron (ZOFRAN) 4 MG tablet Take 4 mg by mouth every 8 (eight) hours as needed for nausea or vomiting.   Yes [provider]  Oxycodone HCl 10 MG TABS Take 10 mg by mouth at bedtime.   Yes [provider]  pantoprazole (PROTONIX) 40 MG tablet TAKE ONE (1) TABLET EACH DAY Patient taking differently: Take 40 mg by mouth daily. 12/01/18  Yes [name], [name], MD  Polyethyl Glycol-Propyl Glycol (SYSTANE) 0.4-0.3 % SOLN Place 1 Application into both eyes every 12 (twelve) hours as needed (eye  discomfort).   Yes [provider]  polyethylene glycol (MIRALAX / GLYCOLAX) 17 g packet Take 17 g by mouth 2 (two) times daily as needed for mild constipation. Patient taking differently: Take 17 g by mouth daily as needed for mild constipation. 09/17/20  Yes [name], MD  promethazine (PHENERGAN) 25 MG suppository Place 1 suppository (25 mg total) rectally every 6 (six) hours as needed for nausea or vomiting. 12/18/20  Yes Mesner, Barbara Cower, MD  saccharomyces boulardii (FLORASTOR) 250 MG capsule Take 250 mg by mouth 2 (two) times daily.   Yes [provider]  sodium chloride 0.9 % infusion Inject 250 mL/hr into the vein daily. 05/08/23  Yes [provider]  Thiamine HCl (VITAMIN B-1 PO) Take 1 tablet by mouth daily.   Yes [provider]  torsemide (DEMADEX) 20 MG tablet Take 60 mg by mouth daily. 04/20/23  Yes [provider]  Continuous Glucose Receiver (FREESTYLE LIBRE 2 READER) DEVI As directed 04/29/23   [name], MD  Continuous Glucose Sensor (FREESTYLE LIBRE 2 SENSOR) MISC 1 Piece by Does not apply route every 14 (fourteen) days. 12/25/22   [name], MD  FLUoxetine (PROZAC) 40 MG capsule Take 40 mg by mouth at bedtime.    [provider]    Physical Exam: Vitals:   05/13/23 1700 05/13/23 1714 05/13/23 1757 05/13/23 2158  BP: (!) 135/55 (!) 135/55 90/75 (!) 145/68  Pulse: 69 68 72 75  Resp: 15 17 18 18   Temp:  (!) 97.3 F (36.3 C) (!) 97.3 F (36.3 C) 97.8 F (36.6 C)  TempSrc:  Oral Oral Oral  SpO2: 100% 99% 100% 100%  Weight:      Height:       1.  General: Patient lying supine in bed,  no acute distress   2. Psychiatric: Alert and oriented x 3, mood and behavior normal for situation, pleasant and cooperative with exam   3. Neurologic: Speech and language are normal, face is symmetric, moves all 4 extremities voluntarily, at baseline without acute deficits on limited exam   4. HEENMT:  Head  is atraumatic, normocephalic, pupils reactive to light, neck is supple, trachea is midline, mucous membranes are moist   5. Respiratory : Lungs are clear to auscultation bilaterally without wheezing, rhonchi, rales, no cyanosis, no increase in work of breathing or accessory muscle use   6. Cardiovascular : Heart rate normal, rhythm is regular, no murmurs, rubs or gallops, no peripheral edema, peripheral pulses palpated   7. Gastrointestinal:  Abdomen is soft, nondistended, nontender to palpation bowel sounds active, no masses or organomegaly palpated   8. Skin:  Skin is warm, dry and intact without rashes, acute lesions, or ulcers on limited exam   9.Musculoskeletal:  Bilateral AKA, no erythema or tenderness in stump  Data Reviewed: In the ED No leukocytosis, patient does have a decreased bicarb at 18 and an AKI with a BUN of 98 and a creatinine of 4.43 Patient also has a hyperglycemia at 229 UA is pending, LR 1 L bolus given in the ED No imaging was done in the ED Will get an ultrasound renal in the a.m. Admission requested for AKI   Assessment and Plan: * AKI (acute kidney injury) (HCC) -Cr at baseline 1.1>> 4.43 -Hold nephrotoxic agents when possible -Continue fluids -continue to monitor  GERD (gastroesophageal reflux disease) -Hold PPI in the setting of AKI  Essential hypertension, benign -Hold lisinopril in the setting of AKI -Continue coreg   Mixed hyperlipidemia -Continue  statin   Primary hypothyroidism -Continue synthroid  Obesity -DMI 67 -Hold nutritional supplement  DM type 2 causing vascular disease (HCC) -80 units basal at baseline -continue reduced dose of basal insulin -sliding scale coverage -Monitor CBG      Advance Care Planning:   Code Status: Full Code  Consults:  none at this time, may need nephrology consult  Family Communication: No family at bedside  Severity of Illness: The appropriate patient status for this patient is  INPATIENT. Inpatient status is judged to be reasonable and necessary in order to provide the required intensity of service to ensure the patient's safety. The patient's presenting symptoms, physical exam findings, and initial radiographic and laboratory data in the context of their chronic comorbidities is felt to place them at high risk for further clinical deterioration. Furthermore, it is not anticipated that the patient will be medically stable for discharge from the hospital within 2 midnights of admission.   * I certify that at the point of admission it is my clinical judgment that the patient will require inpatient hospital care spanning beyond 2 midnights from the point of admission due to high intensity of service, high risk for further deterioration and high frequency of surveillance required.*  Author: Lilyan Gilford, DO 05/13/2023 10:57 PM  For on call review [email or web address].

## 2023-05-13 NOTE — Assessment & Plan Note (Signed)
Continue synthroid.

## 2023-05-13 NOTE — Assessment & Plan Note (Signed)
-  DMI 67 -Hold nutritional supplement

## 2023-05-13 NOTE — ED Triage Notes (Signed)
Pt to er, states that she was sent to the er for a bun of 100, pt states that she is actually feeling better.

## 2023-05-13 NOTE — Assessment & Plan Note (Signed)
-  80 units basal at baseline -continue reduced dose of basal insulin -sliding scale coverage -Monitor CBG

## 2023-05-14 ENCOUNTER — Inpatient Hospital Stay (HOSPITAL_COMMUNITY): Payer: Medicare HMO

## 2023-05-14 DIAGNOSIS — N179 Acute kidney failure, unspecified: Secondary | ICD-10-CM | POA: Diagnosis not present

## 2023-05-14 LAB — COMPREHENSIVE METABOLIC PANEL
ALT: 13 U/L (ref 0–44)
AST: 12 U/L — ABNORMAL LOW (ref 15–41)
Albumin: 2.8 g/dL — ABNORMAL LOW (ref 3.5–5.0)
Alkaline Phosphatase: 95 U/L (ref 38–126)
Anion gap: 10 (ref 5–15)
BUN: 90 mg/dL — ABNORMAL HIGH (ref 6–20)
CO2: 21 mmol/L — ABNORMAL LOW (ref 22–32)
Calcium: 8.5 mg/dL — ABNORMAL LOW (ref 8.9–10.3)
Chloride: 108 mmol/L (ref 98–111)
Creatinine, Ser: 3.64 mg/dL — ABNORMAL HIGH (ref 0.44–1.00)
GFR, Estimated: 14 mL/min — ABNORMAL LOW (ref >60)
Glucose, Bld: 230 mg/dL — ABNORMAL HIGH (ref 70–99)
Potassium: 4.3 mmol/L (ref 3.5–5.1)
Sodium: 139 mmol/L (ref 135–145)
Total Bilirubin: 0.4 mg/dL (ref <1.2)
Total Protein: 6.8 g/dL (ref 6.5–8.1)

## 2023-05-14 LAB — CBC WITH DIFFERENTIAL/PLATELET
Abs Immature Granulocytes: 0.07 10*3/uL (ref 0.00–0.07)
Basophils Absolute: 0 10*3/uL (ref 0.0–0.1)
Basophils Relative: 0 %
Eosinophils Absolute: 0.2 10*3/uL (ref 0.0–0.5)
Eosinophils Relative: 2 %
HCT: 32.4 % — ABNORMAL LOW (ref 36.0–46.0)
Hemoglobin: 10.4 g/dL — ABNORMAL LOW (ref 12.0–15.0)
Immature Granulocytes: 1 %
Lymphocytes Relative: 22 %
Lymphs Abs: 1.4 10*3/uL (ref 0.7–4.0)
MCH: 30.8 pg (ref 26.0–34.0)
MCHC: 32.1 g/dL (ref 30.0–36.0)
MCV: 95.9 fL (ref 80.0–100.0)
Monocytes Absolute: 0.6 10*3/uL (ref 0.1–1.0)
Monocytes Relative: 9 %
Neutro Abs: 4.3 10*3/uL (ref 1.7–7.7)
Neutrophils Relative %: 66 %
Platelets: 311 10*3/uL (ref 150–400)
RBC: 3.38 MIL/uL — ABNORMAL LOW (ref 3.87–5.11)
RDW: 12.4 % (ref 11.5–15.5)
WBC: 6.5 10*3/uL (ref 4.0–10.5)
nRBC: 0 % (ref 0.0–0.2)

## 2023-05-14 LAB — HIV ANTIBODY (ROUTINE TESTING W REFLEX): HIV Screen 4th Generation wRfx: NONREACTIVE

## 2023-05-14 LAB — GLUCOSE, CAPILLARY
Glucose-Capillary: 186 mg/dL — ABNORMAL HIGH (ref 70–99)
Glucose-Capillary: 265 mg/dL — ABNORMAL HIGH (ref 70–99)
Glucose-Capillary: 260 mg/dL — ABNORMAL HIGH (ref 70–99)
Glucose-Capillary: 218 mg/dL — ABNORMAL HIGH (ref 70–99)

## 2023-05-14 LAB — TSH: TSH: 4.342 u[IU]/mL (ref 0.350–4.500)

## 2023-05-14 LAB — HEMOGLOBIN A1C
Hgb A1c MFr Bld: 9.5 % — ABNORMAL HIGH (ref 4.8–5.6)
Mean Plasma Glucose: 225.95 mg/dL

## 2023-05-14 LAB — MAGNESIUM: Magnesium: 1.8 mg/dL (ref 1.7–2.4)

## 2023-05-14 NOTE — Progress Notes (Signed)
PROGRESS NOTE    SHYIA FILLINGIM  ZOX:[id number] DOB: [birth year] DOA: 05/13/2023 PCP: Pcp, No    Brief Narrative:  This 57 y.o. female with medical history significant of bilateral AKA, depression, GERD, diabetes mellitus type 2, hyperlipidemia, and more presents to the ER with a chief complaint of vomiting and diarrhea.  Patient reports having nausea and vomiting and diarrhea for last 12 days.  She has no diarrhea or vomiting since she is in hospital.  She has history of gastroparesis and reports this felt like her gastroparesis symptoms. She is found to have acute kidney injury secondary to dehydration from vomiting and diarrhea.  Assessment & Plan:   Principal Problem:   AKI (acute kidney injury) (HCC) Active Problems:   DM type 2 causing vascular disease (HCC)   Obesity   Primary hypothyroidism   Mixed hyperlipidemia   Essential hypertension, benign   GERD (gastroesophageal reflux disease)   AKI (acute kidney injury) (HCC) Creatinine at baseline 1.1>> presented with creatinine 4.43 Hold nephrotoxic agents when possible. Continue IV fluids. Continue to monitor creatinine.   GERD (gastroesophageal reflux disease) Hold PPI in the setting of AKI   Essential hypertension, benign Hold lisinopril in the setting of AKI Continue coreg   Mixed hyperlipidemia Continue statin    Primary hypothyroidism Continue synthroid   Obesity : BMI 66 Hold nutritional supplement   DM type 2 causing vascular disease (HCC) He used 80 units basal at baseline Continue reduced dose of basal insulin Continue sliding scale coverage -Monitor CBG     DVT prophylaxis: Heparin sq Code Status: Full code Family Communication:No family at bed side. Disposition Plan:     Status is: Inpatient Remains inpatient appropriate because: Admitted for AKI sec to  vomiting, diarrhea.   Consultants:  None  Procedures: None  Antimicrobials:  Anti-infectives (From admission, onward)    None       Subjective: Patient was seen and examined at bedside.  Overnight events noted.   Patient reports doing much better.  She reports diarrhea and vomiting has resolved.   Renal functions are improving.  Her appetite is improved.  Objective: Vitals:   05/13/23 1714 05/13/23 1757 05/13/23 2158 05/14/23 0422  BP: (!) 135/55 90/75 (!) 145/68 (!) 150/60  Pulse: 68 72 75 64  Resp: 17 18 18 18   Temp: (!) 97.3 F (36.3 C) (!) 97.3 F (36.3 C) 97.8 F (36.6 C) 97.7 F (36.5 C)  TempSrc: Oral Oral Oral Oral  SpO2: 99% 100% 100% 100%  Weight:      Height:        Intake/Output Summary (Last 24 hours) at 05/14/2023 1449 Last data filed at 05/14/2023 1432 Gross per 24 hour  Intake 2457.06 ml  Output 1700 ml  Net 757.06 ml   Filed Weights   05/13/23 1306  Weight: (!) 192.8 kg    Examination:  General exam: Appears calm and comfortable,  NAD  Respiratory system: Clear to auscultation. Respiratory effort normal. RR 13 Cardiovascular system: S1 & S2 heard, RRR. No JVD, murmurs, rubs, gallops or clicks. No pedal edema. Gastrointestinal system: Abdomen is non distended, soft and non tender. Normal bowel sounds heard. Central nervous system: Alert and oriented X 3. No focal neurological deficits. Extremities: b/L AKA Skin: No rashes, lesions or ulcers Psychiatry: Judgement and insight appear normal. Mood & affect appropriate.     Data Reviewed: I have personally reviewed following labs and imaging studies  CBC: Recent Labs  Lab 05/13/23 1339 05/14/23 0434  WBC  6.6 6.5  NEUTROABS 4.7 4.3  HGB 10.7* 10.4*  HCT 32.9* 32.4*  MCV 94.0 95.9  PLT 332 311   Basic Metabolic Panel: Recent Labs  Lab 05/13/23 1339 05/13/23 2107 05/14/23 0434  NA 134* 137 139  K 4.3 4.5 4.3  CL 103 107 108  CO2 18* 18* 21*  GLUCOSE 229* 259* 230*  BUN 98* 90* 90*  CREATININE 4.43* 4.03* 3.64*  CALCIUM 8.4* 8.6* 8.5*  MG 1.9  --  1.8   GFR: Estimated Creatinine Clearance: 31.5 mL/min (A)  (by C-G formula based on SCr of 3.64 mg/dL (H)). Liver Function Tests: Recent Labs  Lab 05/13/23 1339 05/14/23 0434  AST 14* 12*  ALT 14 13  ALKPHOS 105 95  BILITOT 0.4 0.4  PROT 7.3 6.8  ALBUMIN 3.2* 2.8*   No results for input(s): "LIPASE", "AMYLASE" in the last 168 hours. No results for input(s): "AMMONIA" in the last 168 hours. Coagulation Profile: No results for input(s): "INR", "PROTIME" in the last 168 hours. Cardiac Enzymes: No results for input(s): "CKTOTAL", "CKMB", "CKMBINDEX", "TROPONINI" in the last 168 hours. BNP (last 3 results) No results for input(s): "PROBNP" in the last 8760 hours. HbA1C: Recent Labs    05/13/23 1339  HGBA1C 9.5*   CBG: Recent Labs  Lab 05/13/23 1457 05/13/23 2213 05/14/23 0731 05/14/23 1110  GLUCAP 226* 263* 186* 265*   Lipid Profile: No results for input(s): "CHOL", "HDL", "LDLCALC", "TRIG", "CHOLHDL", "LDLDIRECT" in the last 72 hours. Thyroid Function Tests: Recent Labs    05/14/23 0434  TSH 4.342   Anemia Panel: No results for input(s): "VITAMINB12", "FOLATE", "FERRITIN", "TIBC", "IRON", "RETICCTPCT" in the last 72 hours. Sepsis Labs: No results for input(s): "PROCALCITON", "LATICACIDVEN" in the last 168 hours.  No results found for this or any previous visit (from the past 240 hour(s)).   Radiology Studies: US RENAL  Result Date: 05/14/2023 CLINICAL DATA:  Acute kidney injury. EXAM: RENAL / URINARY TRACT ULTRASOUND COMPLETE COMPARISON:  Abdomen ultrasound dated 10/15/2007. FINDINGS: Right Kidney: Renal measurements: 10.3 x 6.0 x 5.2 cm = volume: 167 ML. Echogenicity within normal limits. No mass or hydronephrosis visualized. Left Kidney: Renal measurements: 11.3 x 5.7 x 5.6 cm = volume: 191 mL. Echogenicity within normal limits. No mass or hydronephrosis visualized. Bladder: Not visualized. Other: The examination was mildly limited by patient body habitus. IMPRESSION: Normal examination. Electronically Signed   By: Beckie Salts M.D.   On: 05/14/2023 14:22    Scheduled Meds:  atorvastatin  80 mg Oral QHS   carvedilol  12.5 mg Oral BID   heparin  5,000 Units Subcutaneous Q8H   insulin aspart  0-15 Units Subcutaneous TID WC   insulin aspart  0-5 Units Subcutaneous QHS   insulin detemir  60 Units Subcutaneous QHS   levothyroxine  150 mcg Oral QAC breakfast   Continuous Infusions:  sodium chloride 125 mL/hr at 05/14/23 1433     LOS: 1 day    Time spent: 50 mins.    Willeen Niece, MD [facility]   If 7PM-7AM, please contact night-coverage

## 2023-05-14 NOTE — Inpatient Diabetes Management (Signed)
Inpatient Diabetes Program Recommendations  AACE/ADA: New Consensus Statement on Inpatient Glycemic Control (2015)  Target Ranges:  Prepandial:   less than 140 mg/dL      Peak postprandial:   less than 180 mg/dL (1-2 hours)      Critically ill patients:  140 - 180 mg/dL    Latest Reference Range & Units 05/13/23 14:57 05/13/23 22:13  Glucose-Capillary 70 - 99 mg/dL 119 (H) 147 (H)  3 units Novolog  60 units Levemir  (H): Data is abnormally high  Latest Reference Range & Units 05/14/23 07:31 05/14/23 11:10  Glucose-Capillary 70 - 99 mg/dL 829 (H)  3 units Novolog  265 (H)  8 units Novolog   (H): Data is abnormally high     SNF Meds: Humalog 14 TID with meals         Humalog 0-8 units TID per SSI         Lantus 80 QPM         FSL2 CGM   Current: Levemir 60 units QPM     Novolog 0-15 units TID ac/hs    ENDO: Dr. Fransico Him Last seen 03/27/2023 Was told to Increase Lantus to 80 units QPM and Continue Humalog 14 units TID + SSI A1c at that visit was 8.6%    MD- Please consider:  1. Increase Levemir slightly to 65 units QPM  2. Start Novolog Meal Coverage: Novolog 7 units TID with meals (50% home dose) HOLD if pt NPO HOLD if pt eats <50% meals     --Will follow patient during hospitalization--  Ambrose Finland RN, MSN, CDCES Diabetes Coordinator Inpatient Glycemic Control Team Team Pager: [phone] (8a-5p)

## 2023-05-14 NOTE — Plan of Care (Signed)
  Problem: Education: Goal: Knowledge of General Education information will improve Description Including pain rating scale, medication(s)/side effects and non-pharmacologic comfort measures Outcome: Progressing   Problem: Health Behavior/Discharge Planning: Goal: Ability to manage health-related needs will improve Outcome: Progressing   

## 2023-05-14 NOTE — TOC Initial Note (Signed)
Transition of Care [facility]) - Initial/Assessment Note    Patient Details  Name: Michelle Skinner MRN: [id number] Date of Birth: 07-Aug-1965  [facility]) CM/SW Contact:    Leitha Bleak, RN Phone Number: 05/14/2023, 2:33 PM  Clinical Narrative:      Transition of Care [facility]) - Inpatient Brief Assessment   Patient Details  Name: Michelle Skinner MRN: [id number] Date of Birth: 1965-09-02  Transition of Care [facility]) CM/SW Contact:    Leitha Bleak, RN Phone Number: 05/14/2023, 2:34 PM   Clinical Narrative:  TOC confirmed with [facility] that patient is LTC and can return at discharge. TOC following.   Transition of Care Asessment: Insurance and Status: Insurance coverage has been reviewed Patient has primary care physician: Yes [facility]) Home environment has been reviewed: [employer]: No current home services Social Determinants of Health Reivew: SDOH reviewed no interventions necessary Readmission risk has been reviewed: Yes Transition of care needs: no transition of care needs at this time    Expected Discharge Plan: Long Term Acute Care (LTAC) Barriers to Discharge: Continued Medical Work up   Patient Goals and CMS Choice Patient states their goals for this hospitalization and ongoing recovery are:: to returnt to LTC CMS Medicare.gov Compare Post Acute Care list provided to:: Patient Choice offered to / list presented to : Patient   Prior Living Arrangements/Services   Lives with:: Facility Resident Patient language and need for interpreter reviewed:: Yes        Need for Family Participation in Patient Care: Yes (Comment) Care giver support system in place?: Yes (comment)   Criminal Activity/Legal Involvement Pertinent to Current Situation/Hospitalization: No - Comment as needed  Activities of Daily Living   ADL Screening (condition at time of admission) Independently performs ADLs?: Yes (appropriate  for developmental age) Is the patient deaf or have difficulty hearing?: No Does the patient have difficulty seeing, even when wearing glasses/contacts?: No Does the patient have difficulty concentrating, remembering, or making decisions?: No  Permission Sought/Granted                  Emotional Assessment     Affect (typically observed): Accepting Orientation: : Oriented to Self, Oriented to Place, Oriented to  Time, Oriented to Situation Alcohol / Substance Use: Not Applicable Psych Involvement: No (comment)  Admission diagnosis:  AKI (acute kidney injury) (HCC) [N17.9] Patient Active Problem List   Diagnosis Date Noted   Insulin long-term use (HCC) 12/26/2022   Peripherally inserted central catheter (PICC) in place 10/05/2020   MSSA bacteremia    Acute hematogenous osteomyelitis of both feet (HCC)    Severe protein-calorie malnutrition (HCC)    BMI 50.0-59.9, adult (HCC)    Plantar ulcer of right foot (HCC)    Plantar ulcer of left foot (HCC)    Lymphedema    Sepsis (HCC) 09/06/2020   Cellulitis 04/27/2020   AKI (acute kidney injury) (HCC) 04/27/2020   Stasis dermatitis 04/27/2020   Anxiety 03/31/2020   Depression 03/31/2020   GERD (gastroesophageal reflux disease) 03/31/2020   Posttraumatic stress disorder 03/31/2020   Personal history of noncompliance with medical treatment, presenting hazards to health 02/09/2018   Abnormal CT of the abdomen 10/20/2017   Dilated cbd, acquired 10/20/2017   Essential hypertension, benign 01/14/2017   Primary hypothyroidism 05/03/2015   Mixed hyperlipidemia 05/03/2015   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   Obesity 04/25/2015   Vitamin D deficiency 04/25/2015   Cervical  cancer (HCC) 10/05/2012   PCP:  Oneita Hurt No Pharmacy:   [facility] [place], [place] - [address] [place] [address] Phone: [phone] Fax: [phone]     Social Determinants of Health  (SDOH) Social History: SDOH Screenings   Food Insecurity: No Food Insecurity (05/13/2023)  Housing: Low Risk  (05/13/2023)  Transportation Needs: No Transportation Needs (05/13/2023)  Utilities: Not At Risk (05/13/2023)  Depression (PHQ2-9): Low Risk  (10/04/2020)  Tobacco Use: Low Risk  (05/13/2023)   SDOH Interventions:     Readmission Risk Interventions    05/14/2023    2:31 PM  Readmission Risk Prevention Plan  Transportation Screening Complete  PCP or Specialist Appt within 5-7 Days Complete  Home Care Screening Complete  Medication Review (RN CM) Complete  Transition of Care [facility]) - Inpatient Brief Assessment   Patient Details  Name: Michelle Skinner MRN: [id number] Date of Birth: 20-Feb-1966  Transition of Care [facility]) CM/SW Contact:    Leitha Bleak, RN Phone Number: 05/14/2023, 2:33 PM   Clinical Narrative:    Transition of Care Asessment: Insurance and Status: Insurance coverage has been reviewed Patient has primary care physician: Yes ([facility])    Prior/Current Home Services: No current home services Social Determinants of Health Reivew: SDOH reviewed no interventions necessary Readmission risk has been reviewed: Yes Transition of care needs: no transition of care needs at this time

## 2023-05-15 DIAGNOSIS — N179 Acute kidney failure, unspecified: Secondary | ICD-10-CM | POA: Diagnosis not present

## 2023-05-15 LAB — GLUCOSE, CAPILLARY
Glucose-Capillary: 188 mg/dL — ABNORMAL HIGH (ref 70–99)
Glucose-Capillary: 281 mg/dL — ABNORMAL HIGH (ref 70–99)
Glucose-Capillary: 282 mg/dL — ABNORMAL HIGH (ref 70–99)
Glucose-Capillary: 245 mg/dL — ABNORMAL HIGH (ref 70–99)

## 2023-05-15 LAB — PHOSPHORUS: Phosphorus: 3.1 mg/dL (ref 2.5–4.6)

## 2023-05-15 LAB — MAGNESIUM: Magnesium: 1.9 mg/dL (ref 1.7–2.4)

## 2023-05-15 LAB — CBC
HCT: 34.7 % — ABNORMAL LOW (ref 36.0–46.0)
Hemoglobin: 11 g/dL — ABNORMAL LOW (ref 12.0–15.0)
MCH: 30.6 pg (ref 26.0–34.0)
MCHC: 31.7 g/dL (ref 30.0–36.0)
MCV: 96.4 fL (ref 80.0–100.0)
Platelets: 305 10*3/uL (ref 150–400)
RBC: 3.6 MIL/uL — ABNORMAL LOW (ref 3.87–5.11)
RDW: 12.5 % (ref 11.5–15.5)
WBC: 7 10*3/uL (ref 4.0–10.5)
nRBC: 0 % (ref 0.0–0.2)

## 2023-05-15 LAB — BASIC METABOLIC PANEL
Anion gap: 10 (ref 5–15)
BUN: 71 mg/dL — ABNORMAL HIGH (ref 6–20)
CO2: 21 mmol/L — ABNORMAL LOW (ref 22–32)
Calcium: 8.5 mg/dL — ABNORMAL LOW (ref 8.9–10.3)
Chloride: 109 mmol/L (ref 98–111)
Creatinine, Ser: 2.36 mg/dL — ABNORMAL HIGH (ref 0.44–1.00)
GFR, Estimated: 24 mL/min — ABNORMAL LOW (ref >60)
Glucose, Bld: 211 mg/dL — ABNORMAL HIGH (ref 70–99)
Potassium: 4.4 mmol/L (ref 3.5–5.1)
Sodium: 140 mmol/L (ref 135–145)

## 2023-05-15 MED ORDER — NYSTATIN 100000 UNIT/GM EX CREA
TOPICAL_CREAM | Freq: Two times a day (BID) | CUTANEOUS | Status: DC
  Administered 2023-05-16: 1 via TOPICAL
  Filled 2023-05-15: qty 15

## 2023-05-15 MED ORDER — NYSTATIN 100000 UNIT/GM EX POWD
Freq: Three times a day (TID) | CUTANEOUS | Status: DC
  Filled 2023-05-15: qty 15

## 2023-05-15 NOTE — Plan of Care (Signed)

## 2023-05-15 NOTE — Inpatient Diabetes Management (Signed)
Inpatient Diabetes Program Recommendations  AACE/ADA: New Consensus Statement on Inpatient Glycemic Control (2015)  Target Ranges:  Prepandial:   less than 140 mg/dL      Peak postprandial:   less than 180 mg/dL (1-2 hours)      Critically ill patients:  140 - 180 mg/dL   Lab Results  Component Value Date   GLUCAP 188 (H) 05/15/2023   HGBA1C 9.5 (H) 05/13/2023    Review of Glycemic Control  Latest Reference Range & Units 05/14/23 07:31 05/14/23 11:10 05/14/23 16:21 05/14/23 19:55 05/15/23 06:56  Glucose-Capillary 70 - 99 mg/dL 161 (H) 096 (H) 045 (H) 218 (H) 188 (H)  (H): Data is abnormally high  SNF Meds: Humalog 14 TID with meals         Humalog 0-8 units TID per SSI         Lantus 80 QPM         FSL2 CGM     Current: Levemir 60 units QPM     Novolog 0-15 units TID ac/hs   Inpatient Diabetes Program Recommendations:    Please consider:  Novolog 4 units TID with meals if she consumes at least 50%.  Will continue to follow while inpatient.  Thank you, Dulce Sellar, MSN, CDCES Diabetes Coordinator Inpatient Diabetes Program [phone] (team pager from 8a-5p)

## 2023-05-15 NOTE — Progress Notes (Signed)
PROGRESS NOTE    Michelle Skinner  ZOX:[id number] DOB: 30-Nov-1965 DOA: 05/13/2023 PCP: Pcp, No    Brief Narrative:  This 57 y.o. female with medical history significant of bilateral AKA, depression, GERD, diabetes mellitus type 2, hyperlipidemia, and more presents to the ER with a chief complaint of vomiting and diarrhea.  Patient reports having nausea, vomiting and diarrhea for last 12 days.  She has no diarrhea or vomiting since she is in hospital.  She has history of gastroparesis and reports this felt like her gastroparesis symptoms. She is found to have acute kidney injury secondary to dehydration from vomiting and diarrhea.  Assessment & Plan:   Principal Problem:   AKI (acute kidney injury) (HCC) Active Problems:   DM type 2 causing vascular disease (HCC)   Obesity   Primary hypothyroidism   Mixed hyperlipidemia   Essential hypertension, benign   GERD (gastroesophageal reflux disease)   AKI (acute kidney injury) (HCC) Creatinine at baseline 1.1>> presented with creatinine 4.43 Likely due to  volume loss due to vomiting and diarrhea. Hold nephrotoxic agents when possible. Continue IV fluids. Creatinine improving 4.43 > 4.03 > 3.64 > 2.36 Renal ultrasound >  normal. Continue to monitor creatinine.   GERD (gastroesophageal reflux disease): Hold PPI in the setting of AKI.   Essential hypertension, benign: Hold lisinopril in the setting of AKI Continue coreg   Mixed hyperlipidemia: Continue statin.   Primary hypothyroidism: Continue synthroid.   Obesity : BMI 66 Hold nutritional supplement   DM type 2 causing vascular disease (HCC) He used 80 units basal at baseline. Continue reduced dose of basal insulin Continue sliding scale coverage -Monitor CBG    DVT prophylaxis: Heparin sq Code Status: Full code Family Communication:No family at bed side. Disposition Plan:     Status is: Inpatient Remains inpatient appropriate because: Admitted for AKI sec to   vomiting, diarrhea.  Serum creatinine improving  Anticipated discharge back to [facility] 05/16/2023   Consultants:  None  Procedures: None  Antimicrobials:  Anti-infectives (From admission, onward)    None      Subjective: Patient was seen and examined at bedside.  Overnight events noted.   Patient reports doing much improved. States diarrhea and vomiting has resolved. Renal functions are improving.  Her appetite is improved.  Objective: Vitals:   05/14/23 0422 05/14/23 1817 05/14/23 1953 05/15/23 0420  BP: (!) 150/60 (!) 127/54 (!) 146/47 (!) 115/95  Pulse: 64 70 67 60  Resp: 18 18 19 18   Temp: 97.7 F (36.5 C) (!) 97.5 F (36.4 C) 98 F (36.7 C) 98.9 F (37.2 C)  TempSrc: Oral Oral  Oral  SpO2: 100% 99% 100% 100%  Weight:      Height:        Intake/Output Summary (Last 24 hours) at 05/15/2023 1014 Last data filed at 05/15/2023 0709 Gross per 24 hour  Intake 1790.01 ml  Output 1500 ml  Net 290.01 ml   Filed Weights   05/13/23 1306  Weight: (!) 192.8 kg    Examination:  General exam: Appears calm and comfortable,  NAD  Respiratory system: CTA bilaterally. Respiratory effort normal. RR 15 Cardiovascular system: S1 & S2 heard, RRR. No JVD, murmurs, rubs, gallops or clicks. No pedal edema. Gastrointestinal system: Abdomen is non distended, soft and non tender. Normal bowel sounds heard. Central nervous system: Alert and oriented X 3. No focal neurological deficits. Extremities: b/L AKA Skin: No rashes, lesions or ulcers Psychiatry: Judgement and insight appear normal. Mood &  affect appropriate.   Data Reviewed: I have personally reviewed following labs and imaging studies  CBC: Recent Labs  Lab 05/13/23 1339 05/14/23 0434 05/15/23 0441  WBC 6.6 6.5 7.0  NEUTROABS 4.7 4.3  --   HGB 10.7* 10.4* 11.0*  HCT 32.9* 32.4* 34.7*  MCV 94.0 95.9 96.4  PLT 332 311 305   Basic Metabolic Panel: Recent Labs  Lab 05/13/23 1339 05/13/23 2107  05/14/23 0434 05/15/23 0441  NA 134* 137 139 140  K 4.3 4.5 4.3 4.4  CL 103 107 108 109  CO2 18* 18* 21* 21*  GLUCOSE 229* 259* 230* 211*  BUN 98* 90* 90* 71*  CREATININE 4.43* 4.03* 3.64* 2.36*  CALCIUM 8.4* 8.6* 8.5* 8.5*  MG 1.9  --  1.8 1.9  PHOS  --   --   --  3.1   GFR: Estimated Creatinine Clearance: 48.5 mL/min (A) (by C-G formula based on SCr of 2.36 mg/dL (H)). Liver Function Tests: Recent Labs  Lab 05/13/23 1339 05/14/23 0434  AST 14* 12*  ALT 14 13  ALKPHOS 105 95  BILITOT 0.4 0.4  PROT 7.3 6.8  ALBUMIN 3.2* 2.8*   No results for input(s): "LIPASE", "AMYLASE" in the last 168 hours. No results for input(s): "AMMONIA" in the last 168 hours. Coagulation Profile: No results for input(s): "INR", "PROTIME" in the last 168 hours. Cardiac Enzymes: No results for input(s): "CKTOTAL", "CKMB", "CKMBINDEX", "TROPONINI" in the last 168 hours. BNP (last 3 results) No results for input(s): "PROBNP" in the last 8760 hours. HbA1C: Recent Labs    05/13/23 1339  HGBA1C 9.5*   CBG: Recent Labs  Lab 05/14/23 0731 05/14/23 1110 05/14/23 1621 05/14/23 1955 05/15/23 0656  GLUCAP 186* 265* 260* 218* 188*   Lipid Profile: No results for input(s): "CHOL", "HDL", "LDLCALC", "TRIG", "CHOLHDL", "LDLDIRECT" in the last 72 hours. Thyroid Function Tests: Recent Labs    05/14/23 0434  TSH 4.342   Anemia Panel: No results for input(s): "VITAMINB12", "FOLATE", "FERRITIN", "TIBC", "IRON", "RETICCTPCT" in the last 72 hours. Sepsis Labs: No results for input(s): "PROCALCITON", "LATICACIDVEN" in the last 168 hours.  No results found for this or any previous visit (from the past 240 hour(s)).   Radiology Studies: US RENAL  Result Date: 05/14/2023 CLINICAL DATA:  Acute kidney injury. EXAM: RENAL / URINARY TRACT ULTRASOUND COMPLETE COMPARISON:  Abdomen ultrasound dated 10/15/2007. FINDINGS: Right Kidney: Renal measurements: 10.3 x 6.0 x 5.2 cm = volume: 167 ML. Echogenicity  within normal limits. No mass or hydronephrosis visualized. Left Kidney: Renal measurements: 11.3 x 5.7 x 5.6 cm = volume: 191 mL. Echogenicity within normal limits. No mass or hydronephrosis visualized. Bladder: Not visualized. Other: The examination was mildly limited by patient body habitus. IMPRESSION: Normal examination. Electronically Signed   By: Beckie Salts M.D.   On: 05/14/2023 14:22    Scheduled Meds:  atorvastatin  80 mg Oral QHS   carvedilol  12.5 mg Oral BID   heparin  5,000 Units Subcutaneous Q8H   insulin aspart  0-15 Units Subcutaneous TID WC   insulin aspart  0-5 Units Subcutaneous QHS   insulin detemir  60 Units Subcutaneous QHS   levothyroxine  150 mcg Oral QAC breakfast   nystatin   Topical TID   Continuous Infusions:   LOS: 2 days   Time spent: 35 mins.    Willeen Niece, MD [facility]   If 7PM-7AM, please contact night-coverage

## 2023-05-16 DIAGNOSIS — N179 Acute kidney failure, unspecified: Secondary | ICD-10-CM | POA: Diagnosis not present

## 2023-05-16 LAB — BASIC METABOLIC PANEL
Anion gap: 7 (ref 5–15)
BUN: 56 mg/dL — ABNORMAL HIGH (ref 6–20)
CO2: 24 mmol/L (ref 22–32)
Calcium: 8.6 mg/dL — ABNORMAL LOW (ref 8.9–10.3)
Chloride: 109 mmol/L (ref 98–111)
Creatinine, Ser: 1.97 mg/dL — ABNORMAL HIGH (ref 0.44–1.00)
GFR, Estimated: 29 mL/min — ABNORMAL LOW (ref >60)
Glucose, Bld: 250 mg/dL — ABNORMAL HIGH (ref 70–99)
Potassium: 4.7 mmol/L (ref 3.5–5.1)
Sodium: 140 mmol/L (ref 135–145)

## 2023-05-16 LAB — GLUCOSE, CAPILLARY
Glucose-Capillary: 153 mg/dL — ABNORMAL HIGH (ref 70–99)
Glucose-Capillary: 210 mg/dL — ABNORMAL HIGH (ref 70–99)
Glucose-Capillary: 257 mg/dL — ABNORMAL HIGH (ref 70–99)
Glucose-Capillary: 271 mg/dL — ABNORMAL HIGH (ref 70–99)

## 2023-05-16 MED ORDER — INSULIN ASPART 100 UNIT/ML IJ SOLN
4.0000 [IU] | Freq: Three times a day (TID) | INTRAMUSCULAR | Status: DC
  Administered 2023-05-16 – 2023-05-17 (×4): 4 [IU] via SUBCUTANEOUS

## 2023-05-16 NOTE — Care Management Important Message (Signed)
Important Message  Patient Details  Name: Michelle Skinner MRN: [id number] Date of Birth: 06-18-1966   Important Message Given:  Yes - Medicare IM     Corey Harold 05/16/2023, 11:41 AM

## 2023-05-16 NOTE — Progress Notes (Signed)
PROGRESS NOTE    Michelle Skinner  GUY:[id number] DOB: [birth year] DOA: 05/13/2023 PCP: Pcp, No    Brief Narrative:  This 57 y.o. female with medical history significant of bilateral AKA, depression, GERD, diabetes mellitus type 2, hyperlipidemia, and more presents to the ER with a chief complaint of vomiting and diarrhea.  Patient reports having nausea, vomiting and diarrhea for last 12 days.  She has no diarrhea or vomiting since she is in hospital.  She has history of gastroparesis and reports this felt like her gastroparesis symptoms. She is found to have acute kidney injury secondary to dehydration from vomiting and diarrhea.  Assessment & Plan:   Principal Problem:   AKI (acute kidney injury) (HCC) Active Problems:   DM type 2 causing vascular disease (HCC)   Obesity   Primary hypothyroidism   Mixed hyperlipidemia   Essential hypertension, benign   GERD (gastroesophageal reflux disease)   AKI (acute kidney injury) (HCC) Creatinine at baseline 1.1>> presented with creatinine 4.43 Likely due to  volume loss due to vomiting and diarrhea. Hold nephrotoxic agents when possible. Continue IV fluids. Creatinine improving 4.43 > 4.03 > 3.64 > 2.36 Renal ultrasound >  normal. Continue to monitor creatinine.   GERD (gastroesophageal reflux disease): Hold PPI in the setting of AKI.   Essential hypertension, benign: Hold lisinopril in the setting of AKI Continue coreg   Mixed hyperlipidemia: Continue statin.   Primary hypothyroidism: Continue synthroid.   Morbid Obesity : BMI 66 Hold nutritional supplement.   DM type 2 causing vascular disease (HCC) He used 80 units basal at baseline. Continue reduced dose of basal insulin Continue sliding scale coverage -Monitor CBG    DVT prophylaxis: Heparin sq Code Status: Full code Family Communication:No family at bed side. Disposition Plan:     Status is: Inpatient Remains inpatient appropriate because: Admitted for AKI sec  to  vomiting, diarrhea.  Serum creatinine improving  Anticipated discharge back to [facility] 05/17/2023   Consultants:  None  Procedures: None  Antimicrobials:  Anti-infectives (From admission, onward)    None      Subjective: Patient was seen and examined at bedside.  Overnight events noted.   Patient reports diarrhea and vomiting has resolved.  She reports doing much improved. Renal functions are improving.  Her appetite is improved.  Objective: Vitals:   05/15/23 1318 05/15/23 2000 05/15/23 2135 05/16/23 0449  BP: (!) 128/51 119/60 (!) 142/56 (!) 158/81  Pulse: 69 72 70 60  Resp: 20 20  20   Temp: 98.2 F (36.8 C) 97.7 F (36.5 C)  97.7 F (36.5 C)  TempSrc: Oral   Oral  SpO2: 100% 99%  100%  Weight:      Height:        Intake/Output Summary (Last 24 hours) at 05/16/2023 1058 Last data filed at 05/16/2023 0911 Gross per 24 hour  Intake 480 ml  Output 1300 ml  Net -820 ml   Filed Weights   05/13/23 1306  Weight: (!) 192.8 kg    Examination:  General exam: Appears calm and comfortable, not in any acute distress. Respiratory system: CTA bilaterally. Respiratory effort normal. RR 13 Cardiovascular system: S1 & S2 heard, RRR.  No murmur, no clicks. No pedal edema. Gastrointestinal system: Abdomen is non distended, soft and non tender. Normal bowel sounds heard. Central nervous system: Alert and oriented X 3. No focal neurological deficits. Extremities: b/L AKA Skin: No rashes, lesions or ulcers Psychiatry: Judgement and insight appear normal. Mood & affect appropriate.  Data Reviewed: I have personally reviewed following labs and imaging studies  CBC: Recent Labs  Lab 05/13/23 1339 05/14/23 0434 05/15/23 0441  WBC 6.6 6.5 7.0  NEUTROABS 4.7 4.3  --   HGB 10.7* 10.4* 11.0*  HCT 32.9* 32.4* 34.7*  MCV 94.0 95.9 96.4  PLT 332 311 305   Basic Metabolic Panel: Recent Labs  Lab 05/13/23 1339 05/13/23 2107 05/14/23 0434 05/15/23 0441  NA 134*  137 139 140  K 4.3 4.5 4.3 4.4  CL 103 107 108 109  CO2 18* 18* 21* 21*  GLUCOSE 229* 259* 230* 211*  BUN 98* 90* 90* 71*  CREATININE 4.43* 4.03* 3.64* 2.36*  CALCIUM 8.4* 8.6* 8.5* 8.5*  MG 1.9  --  1.8 1.9  PHOS  --   --   --  3.1   GFR: Estimated Creatinine Clearance: 48.5 mL/min (A) (by C-G formula based on SCr of 2.36 mg/dL (H)). Liver Function Tests: Recent Labs  Lab 05/13/23 1339 05/14/23 0434  AST 14* 12*  ALT 14 13  ALKPHOS 105 95  BILITOT 0.4 0.4  PROT 7.3 6.8  ALBUMIN 3.2* 2.8*   No results for input(s): "LIPASE", "AMYLASE" in the last 168 hours. No results for input(s): "AMMONIA" in the last 168 hours. Coagulation Profile: No results for input(s): "INR", "PROTIME" in the last 168 hours. Cardiac Enzymes: No results for input(s): "CKTOTAL", "CKMB", "CKMBINDEX", "TROPONINI" in the last 168 hours. BNP (last 3 results) No results for input(s): "PROBNP" in the last 8760 hours. HbA1C: Recent Labs    05/13/23 1339  HGBA1C 9.5*   CBG: Recent Labs  Lab 05/15/23 0656 05/15/23 1107 05/15/23 1723 05/15/23 2113 05/16/23 0732  GLUCAP 188* 281* 282* 245* 153*   Lipid Profile: No results for input(s): "CHOL", "HDL", "LDLCALC", "TRIG", "CHOLHDL", "LDLDIRECT" in the last 72 hours. Thyroid Function Tests: Recent Labs    05/14/23 0434  TSH 4.342   Anemia Panel: No results for input(s): "VITAMINB12", "FOLATE", "FERRITIN", "TIBC", "IRON", "RETICCTPCT" in the last 72 hours. Sepsis Labs: No results for input(s): "PROCALCITON", "LATICACIDVEN" in the last 168 hours.  No results found for this or any previous visit (from the past 240 hour(s)).   Radiology Studies: No results found.  Scheduled Meds:  atorvastatin  80 mg Oral QHS   carvedilol  12.5 mg Oral BID   heparin  5,000 Units Subcutaneous Q8H   insulin aspart  0-15 Units Subcutaneous TID WC   insulin aspart  0-5 Units Subcutaneous QHS   insulin aspart  4 Units Subcutaneous TID WC   insulin detemir  60  Units Subcutaneous QHS   levothyroxine  150 mcg Oral QAC breakfast   nystatin cream   Topical BID   Continuous Infusions:   LOS: 3 days   Time spent: 35 mins.    Willeen Niece, MD [facility]   If 7PM-7AM, please contact night-coverage

## 2023-05-16 NOTE — Inpatient Diabetes Management (Signed)
Inpatient Diabetes Program Recommendations  AACE/ADA: New Consensus Statement on Inpatient Glycemic Control (2015)  Target Ranges:  Prepandial:   less than 140 mg/dL      Peak postprandial:   less than 180 mg/dL (1-2 hours)      Critically ill patients:  140 - 180 mg/dL   Lab Results  Component Value Date   GLUCAP 153 (H) 05/16/2023   HGBA1C 9.5 (H) 05/13/2023    Review of Glycemic Control  Latest Reference Range & Units 05/15/23 06:56 05/15/23 11:07 05/15/23 17:23 05/15/23 21:13 05/16/23 07:32  Glucose-Capillary 70 - 99 mg/dL 696 (H) 789 (H) 381 (H) 245 (H) 153 (H)  (H): Data is abnormally high  SNF Meds: Humalog 14 TID with meals         Humalog 0-8 units TID per SSI         Lantus 80 QPM         FSL2 CGM     Current: Levemir 60 units QPM     Novolog 0-15 units TID ac/hs    Inpatient Diabetes Program Recommendations:     Please consider:   Novolog 4 units TID with meals if she consumes at least 50%.   Will continue to follow while inpatient.   Thank you, Dulce Sellar, MSN, CDCES Diabetes Coordinator Inpatient Diabetes Program [phone] (team pager from 8a-5p)

## 2023-05-16 NOTE — TOC Progression Note (Signed)
Transition of Care [facility]) - Progression Note    Patient Details  Name: Michelle Skinner MRN: [id number] Date of Birth: 1965/09/04  [facility] CM/SW Contact  Armanda Heritage, RN Phone Number: 05/16/2023, 10:10 AM  Clinical Narrative:    Patient is anticipated to be medically stable for discharge on Saturday.  Per admissions rep at [facility], [place], facility can accept patient back over the weekend.  Eunice Blase will be the contact person for discharge [phone]).   Expected Discharge Plan: Long Term Acute Care (LTAC) Barriers to Discharge: Continued Medical Work up  Expected Discharge Plan and Services                                               Social Determinants of Health (SDOH) Interventions SDOH Screenings   Food Insecurity: No Food Insecurity (05/13/2023)  Housing: Low Risk  (05/13/2023)  Transportation Needs: No Transportation Needs (05/13/2023)  Utilities: Not At Risk (05/13/2023)  Depression (PHQ2-9): Low Risk  (10/04/2020)  Tobacco Use: Low Risk  (05/13/2023)    Readmission Risk Interventions    05/14/2023    2:31 PM  Readmission Risk Prevention Plan  Transportation Screening Complete  PCP or Specialist Appt within 5-7 Days Complete  Home Care Screening Complete  Medication Review (RN CM) Complete

## 2023-05-16 NOTE — Plan of Care (Signed)
  Problem: Education: Goal: Knowledge of General Education information will improve Description Including pain rating scale, medication(s)/side effects and non-pharmacologic comfort measures Outcome: Progressing   Problem: Health Behavior/Discharge Planning: Goal: Ability to manage health-related needs will improve Outcome: Progressing   

## 2023-05-16 NOTE — Plan of Care (Signed)
  Problem: Education: Goal: Knowledge of General Education information will improve Description: Including pain rating scale, medication(s)/side effects and non-pharmacologic comfort measures Outcome: Progressing   Problem: Health Behavior/Discharge Planning: Goal: Ability to manage health-related needs will improve Outcome: Progressing   Problem: Clinical Measurements: Goal: Ability to maintain clinical measurements within normal limits will improve Outcome: Progressing Goal: Diagnostic test results will improve Outcome: Progressing   Problem: Nutrition: Goal: Adequate nutrition will be maintained Outcome: Progressing

## 2023-05-17 DIAGNOSIS — E860 Dehydration: Secondary | ICD-10-CM | POA: Diagnosis not present

## 2023-05-17 DIAGNOSIS — Z7401 Bed confinement status: Secondary | ICD-10-CM | POA: Diagnosis not present

## 2023-05-17 DIAGNOSIS — R6889 Other general symptoms and signs: Secondary | ICD-10-CM | POA: Diagnosis not present

## 2023-05-17 DIAGNOSIS — L03115 Cellulitis of right lower limb: Secondary | ICD-10-CM | POA: Diagnosis not present

## 2023-05-17 DIAGNOSIS — I10 Essential (primary) hypertension: Secondary | ICD-10-CM | POA: Diagnosis not present

## 2023-05-17 DIAGNOSIS — I509 Heart failure, unspecified: Secondary | ICD-10-CM | POA: Diagnosis not present

## 2023-05-17 DIAGNOSIS — N179 Acute kidney failure, unspecified: Secondary | ICD-10-CM | POA: Diagnosis not present

## 2023-05-17 DIAGNOSIS — Z743 Need for continuous supervision: Secondary | ICD-10-CM | POA: Diagnosis not present

## 2023-05-17 LAB — CBC
HCT: 32.7 % — ABNORMAL LOW (ref 36.0–46.0)
Hemoglobin: 10.2 g/dL — ABNORMAL LOW (ref 12.0–15.0)
MCH: 29.7 pg (ref 26.0–34.0)
MCHC: 31.2 g/dL (ref 30.0–36.0)
MCV: 95.1 fL (ref 80.0–100.0)
Platelets: 317 10*3/uL (ref 150–400)
RBC: 3.44 MIL/uL — ABNORMAL LOW (ref 3.87–5.11)
RDW: 12.3 % (ref 11.5–15.5)
WBC: 8.4 10*3/uL (ref 4.0–10.5)
nRBC: 0 % (ref 0.0–0.2)

## 2023-05-17 LAB — MAGNESIUM: Magnesium: 1.9 mg/dL (ref 1.7–2.4)

## 2023-05-17 LAB — BASIC METABOLIC PANEL
Anion gap: 6 (ref 5–15)
BUN: 53 mg/dL — ABNORMAL HIGH (ref 6–20)
CO2: 22 mmol/L (ref 22–32)
Calcium: 8.6 mg/dL — ABNORMAL LOW (ref 8.9–10.3)
Chloride: 109 mmol/L (ref 98–111)
Creatinine, Ser: 1.93 mg/dL — ABNORMAL HIGH (ref 0.44–1.00)
GFR, Estimated: 30 mL/min — ABNORMAL LOW (ref >60)
Glucose, Bld: 262 mg/dL — ABNORMAL HIGH (ref 70–99)
Potassium: 5.2 mmol/L — ABNORMAL HIGH (ref 3.5–5.1)
Sodium: 137 mmol/L (ref 135–145)

## 2023-05-17 LAB — GLUCOSE, CAPILLARY
Glucose-Capillary: 271 mg/dL — ABNORMAL HIGH (ref 70–99)
Glucose-Capillary: 230 mg/dL — ABNORMAL HIGH (ref 70–99)

## 2023-05-17 LAB — POTASSIUM: Potassium: 5 mmol/L (ref 3.5–5.1)

## 2023-05-17 LAB — PHOSPHORUS: Phosphorus: 2.6 mg/dL (ref 2.5–4.6)

## 2023-05-17 MED ORDER — OXYCODONE HCL 5 MG PO TABS: 3.0000 mg | ORAL_TABLET | ORAL | 0 refills | Status: AC | PRN

## 2023-05-17 NOTE — NC FL2 (Signed)
MEDICAID FL2 LEVEL OF CARE FORM     IDENTIFICATION  Patient Name: Michelle Skinner Birthdate: 18-Jan-1966 Sex: female Admission Date (Current Location): 05/13/2023  [place] and [place] Number:  Aaron Edelman [id number] Facility and Address:  [facility],  [address] [place] [address]      Provider Number: [id number]  Attending Physician Name and Address:  Willeen Niece, MD  Relative Name and Phone Number:  Debbe Bales [facility])  [phone] (Mobile)    Current Level of Care: Hospital Recommended Level of Care: Nursing Facility Prior Approval Number:    Date Approved/Denied:   PASRR Number: [id number]  Discharge Plan: SNF    Current Diagnoses: Patient Active Problem List   Diagnosis Date Noted   Insulin long-term use (HCC) 12/26/2022   Peripherally inserted central catheter (PICC) in place 10/05/2020   MSSA bacteremia    Acute hematogenous osteomyelitis of both feet (HCC)    Severe protein-calorie malnutrition (HCC)    BMI 50.0-59.9, adult (HCC)    Plantar ulcer of right foot (HCC)    Plantar ulcer of left foot (HCC)    Lymphedema    Sepsis (HCC) 09/06/2020   Cellulitis 04/27/2020   AKI (acute kidney injury) (HCC) 04/27/2020   Stasis dermatitis 04/27/2020   Anxiety 03/31/2020   Depression 03/31/2020   GERD (gastroesophageal reflux disease) 03/31/2020   Posttraumatic stress disorder 03/31/2020   Personal history of noncompliance with medical treatment, presenting hazards to health 02/09/2018   Abnormal CT of the abdomen 10/20/2017   Dilated cbd, acquired 10/20/2017   Essential hypertension, benign 01/14/2017   Primary hypothyroidism 05/03/2015   Mixed hyperlipidemia 05/03/2015   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   Obesity 04/25/2015   Vitamin D deficiency 04/25/2015   Cervical cancer (HCC) 10/05/2012    Orientation RESPIRATION BLADDER Height & Weight     Self, Time, Situation, Place  Normal External catheter  Weight: (!) 425 lb (192.8 kg) Height:  5\' 7"  (170.2 cm)  BEHAVIORAL SYMPTOMS/MOOD NEUROLOGICAL BOWEL NUTRITION STATUS      Continent Diet  AMBULATORY STATUS COMMUNICATION OF NEEDS Skin   Limited Assist Verbally Normal                       Personal Care Assistance Level of Assistance  Bathing, Feeding, Dressing Bathing Assistance: Limited assistance Feeding assistance: Limited assistance Dressing Assistance: Limited assistance     Functional Limitations Info  Sight Sight Info: Impaired        SPECIAL CARE FACTORS FREQUENCY                       Contractures      Additional Factors Info  Code Status, Allergies (Full) Code Status Info: Full Allergies Info: Allergies: Sulfa Antibiotics, Canagliflozin, Clindamycin/lincomycin, Coenzyme Q10-black Pepper, Latex, Metformin And Related, Ozempic (0.25 Or 0.5 Mg-dose) (Semaglutide(0.25 Or 0.5mg -dos)), Polyurethane (Urethane), Tape, Cherry, Gabapentin, Other, Sulfamethoxazole-trimethoprim           Current Medications (05/17/2023):  This is the current hospital active medication list Current Facility-Administered Medications  Medication Dose Route Frequency Provider Last Rate Last Admin   acetaminophen (TYLENOL) tablet 650 mg  650 mg Oral Q6H PRN Zierle-Ghosh, Asia B, DO   650 mg at 05/17/23 0630   Or   acetaminophen (TYLENOL) suppository 650 mg  650 mg Rectal Q6H PRN Zierle-Ghosh, Asia B, DO       atorvastatin (LIPITOR) tablet 80 mg  80 mg Oral QHS Zierle-Ghosh, [place]  B, DO   80 mg at 05/16/23 2254   carvedilol (COREG) tablet 12.5 mg  12.5 mg Oral BID Zierle-Ghosh, Asia B, DO   12.5 mg at 05/17/23 2585   heparin injection 5,000 Units  5,000 Units Subcutaneous Q8H Zierle-Ghosh, Asia B, DO   5,000 Units at 05/17/23 0530   insulin aspart (novoLOG) injection 0-15 Units  0-15 Units Subcutaneous TID WC Zierle-Ghosh, Asia B, DO   8 Units at 05/17/23 0823   insulin aspart (novoLOG) injection 0-5 Units  0-5 Units Subcutaneous QHS  Zierle-Ghosh, Asia B, DO   3 Units at 05/16/23 2254   insulin aspart (novoLOG) injection 4 Units  4 Units Subcutaneous TID WC Willeen Niece, MD   4 Units at 05/17/23 0823   insulin detemir (LEVEMIR) injection 60 Units  60 Units Subcutaneous QHS Zierle-Ghosh, Asia B, DO   60 Units at 05/16/23 2254   levothyroxine (SYNTHROID) tablet 150 mcg  150 mcg Oral QAC breakfast Zierle-Ghosh, Asia B, DO   150 mcg at 05/17/23 0529   morphine (PF) 2 MG/ML injection 2 mg  2 mg Intravenous Q4H PRN Zierle-Ghosh, Asia B, DO       nystatin cream (MYCOSTATIN)   Topical BID Willeen Niece, MD   Given at 05/17/23 0824   ondansetron (ZOFRAN) tablet 4 mg  4 mg Oral Q6H PRN Zierle-Ghosh, Asia B, DO       Or   ondansetron (ZOFRAN) injection 4 mg  4 mg Intravenous Q6H PRN Zierle-Ghosh, Asia B, DO   4 mg at 05/15/23 1735   oxyCODONE (Oxy IR/ROXICODONE) immediate release tablet 5 mg  5 mg Oral Q4H PRN Zierle-Ghosh, Asia B, DO   5 mg at 05/15/23 1504     Discharge Medications: Please see discharge summary for a list of discharge medications.  Relevant Imaging Results:  Relevant Lab Results:   Additional Information [id number]  Catalina Gravel, LCSW

## 2023-05-17 NOTE — Progress Notes (Signed)
Attempted to call report to [facility]  x2. With No answer.

## 2023-05-17 NOTE — Discharge Summary (Signed)
Physician Discharge Summary  BURKLEE BI JXB:[id number] DOB: [birth year] DOA: 05/13/2023  PCP: Oneita Hurt, No  Admit date: 05/13/2023  Discharge date: 05/17/2023  Admitted From: [facility]  Disposition:  SNF [facility]  Recommendations for Outpatient Follow-up:  Follow up with PCP in 1-2 weeks. Advised to recheck CBC and CMP in 1 week. Advised to hold lisinopril and torsemide given acute kidney injury. Resume lisinopril and torsemide once renal functions improves.  Home Health:None Equipment/Devices:None  Discharge Condition: Stable CODE STATUS:Full code Diet recommendation: Heart Healthy   Brief [facility] Course: This 57 y.o. female with medical history significant of bilateral AKA, depression, GERD, diabetes mellitus type 2, hyperlipidemia, and more presents to the ER with a chief complaint of vomiting and diarrhea.  Patient reports having nausea, vomiting and diarrhea for last 12 days.  She has no diarrhea or vomiting since she is in hospital.  She has history of gastroparesis and reports this felt like her gastroparesis symptoms. She is found to have acute kidney injury secondary to dehydration from vomiting and diarrhea. Diarrhea, vomiting and nausea has resolved.  Renal functions continue to improve with IV hydration.  Patient feels much better. She wants to be discharged.  Lisinopril and torsemide is on hold given acute kidney injury.  Patient is being discharged to skilled nursing facility.  Advised to recheck labs and resume lisinopril and torsemide once renal functions improves.   Discharge Diagnoses:  Principal Problem:   AKI (acute kidney injury) (HCC) Active Problems:   DM type 2 causing vascular disease (HCC)   Obesity   Primary hypothyroidism   Mixed hyperlipidemia   Essential hypertension, benign   GERD (gastroesophageal reflux disease)  AKI (acute kidney injury) (HCC) Creatinine at baseline 1.1>> presented with creatinine 4.43 Likely due to  volume  loss due to vomiting and diarrhea. Hold nephrotoxic agents when possible. Continue IV fluids. Creatinine improving 4.43 > 4.03 > 3.64 > 2.36 >1.76 Renal ultrasound >  normal. Continue to monitor creatinine.   GERD (gastroesophageal reflux disease): Hold PPI in the setting of AKI.   Essential hypertension, benign: Hold lisinopril  and torsemide in the setting of AKI Continue coreg   Mixed hyperlipidemia: Continue statin.   Primary hypothyroidism: Continue synthroid.   Morbid Obesity : BMI 66 Hold nutritional supplement.   DM type 2 causing vascular disease (HCC) He used 80 units basal at baseline. Continue reduced dose of basal insulin Continue sliding scale coverage -Monitor CBG    Discharge Instructions  Discharge Instructions     Call MD for:  persistant dizziness or light-headedness   Complete by: As directed    Call MD for:  persistant nausea and vomiting   Complete by: As directed    Diet - low sodium heart healthy   Complete by: As directed    Discharge instructions   Complete by: As directed    Advised to follow-up with primary care physician in 1 week. Advised to recheck CBC and CMP in 1 week. Advised to hold lisinopril and torsemide given acute kidney injury. Resume lisinopril and torsemide once renal functions improves.   Increase activity slowly   Complete by: As directed       Allergies as of 05/17/2023       Reactions   Sulfa Antibiotics Diarrhea, Nausea And Vomiting, Rash   Canagliflozin Diarrhea, Nausea And Vomiting, Other (See Comments)   Unknown   Clindamycin/lincomycin Diarrhea, Nausea And Vomiting   Coenzyme Q10-black Pepper    Latex    Rash, itching,  over a long period of time   Metformin And Related Diarrhea, Nausea And Vomiting   Ozempic (0.25 Or 0.5 Mg-dose) [semaglutide(0.25 Or 0.5mg -dos)] Diarrhea, Nausea And Vomiting   Polyurethane [urethane]    Blistering and swelling   Tape Itching, Swelling   Reaction after a few days use/  please use paper tape   Cherry Itching, Rash, Swelling, Other (See Comments)   Throat swelling Unknown    Throat swelling   Gabapentin Diarrhea, Nausea And Vomiting, Rash   Unknown   Other Itching, Swelling, Rash   Reaction to Hot peppers (throat swelling)   Sulfamethoxazole-trimethoprim Diarrhea, Nausea And Vomiting, Rash, Itching        Medication List     STOP taking these medications    lisinopril 20 MG tablet Commonly known as: ZESTRIL   torsemide 20 MG tablet Commonly known as: DEMADEX       TAKE these medications    acetaminophen 325 MG tablet Commonly known as: TYLENOL Take 650 mg by mouth every 4 (four) hours as needed for mild pain (pain score 1-3).   atorvastatin 80 MG tablet Commonly known as: LIPITOR Take 80 mg by mouth daily.   buPROPion ER 100 MG 12 hr tablet Commonly known as: WELLBUTRIN SR Take 100 mg by mouth every morning.   CALCIUM 500 PO Take 1 tablet by mouth daily.   carvedilol 12.5 MG tablet Commonly known as: Coreg Take 1 tablet (12.5 mg total) by mouth 2 (two) times daily.   cloNIDine 0.1 MG tablet Commonly known as: CATAPRES Take 0.1 mg by mouth at bedtime.   diphenhydrAMINE 25 mg capsule Commonly known as: BENADRYL Take 25 mg by mouth daily as needed (allergic reactions).   docusate sodium 100 MG capsule Commonly known as: COLACE Take 100 mg by mouth every 8 (eight) hours as needed for mild constipation.   FLUoxetine 40 MG capsule Commonly known as: PROZAC Take 40 mg by mouth at bedtime.   folic acid 1 MG tablet Commonly known as: FOLVITE Take 1 tablet (1 mg total) by mouth daily.   FreeStyle Libre 2 Reader Hardie Pulley As directed   [employer] 2 Sensor Misc 1 Piece by Does not apply route every 14 (fourteen) days.   hydroxypropyl methylcellulose / hypromellose 2.5 % ophthalmic solution Commonly known as: ISOPTO TEARS / GONIOVISC Place 1-2 drops into both eyes every 8 (eight) hours as needed for dry eyes (diabetic  retinopathy). 1 drop for dry eyes and 2 drops for diabetic retinopathy   Iron 325 (65 Fe) MG Tabs Take 1 tablet by mouth at bedtime.   lamoTRIgine 25 MG tablet Commonly known as: LAMICTAL Take 25 mg by mouth 2 (two) times daily.   levothyroxine 150 MCG tablet Commonly known as: SYNTHROID Take 1 tablet (150 mcg total) by mouth daily before breakfast.   loperamide 2 MG capsule Commonly known as: IMODIUM Take 2 mg by mouth as needed for diarrhea or loose stools.   magnesium citrate Soln Take 296 mLs (1 Bottle total) by mouth daily as needed for severe constipation. What changed:  when to take this reasons to take this   metoCLOPramide 10 MG tablet Commonly known as: REGLAN Take 10 mg by mouth every 6 (six) hours as needed for nausea or vomiting.   Narcan 4 MG/0.1ML Liqd nasal spray kit Generic drug: naloxone Place 1 spray into the nose once.   NovoLOG FlexPen 100 UNIT/ML FlexPen Generic drug: insulin aspart Inject 1-14 Units into the skin 6 (six) times daily. Inject  14 units three times a day 10 minutes before meals, then 0-8 units with meals depending on blood sugar reading. 151-200 = 1 201-250 = 2 251-300 = 4 301-350 = 6 351-400 = 8   nystatin powder Commonly known as: MYCOSTATIN/NYSTOP Apply 1 Application topically 3 (three) times daily. Under breasts for redness   omega-3 acid ethyl esters 1 g capsule Commonly known as: LOVAZA Take 1 capsule by mouth daily.   ondansetron 4 MG tablet Commonly known as: ZOFRAN Take 4 mg by mouth every 8 (eight) hours as needed for nausea or vomiting.   oxyCODONE 5 MG immediate release tablet Commonly known as: Oxy IR/ROXICODONE Take 0.5 tablets (2.5 mg total) by mouth every 4 (four) hours as needed for up to 3 days for moderate pain (pain score 4-6). What changed:  medication strength how much to take when to take this reasons to take this   pantoprazole 40 MG tablet Commonly known as: PROTONIX TAKE ONE (1) TABLET EACH  DAY What changed: See the new instructions.   polyethylene glycol 17 g packet Commonly known as: MIRALAX / GLYCOLAX Take 17 g by mouth 2 (two) times daily as needed for mild constipation. What changed: when to take this   promethazine 25 MG suppository Commonly known as: PHENERGAN Place 1 suppository (25 mg total) rectally every 6 (six) hours as needed for nausea or vomiting.   saccharomyces boulardii 250 MG capsule Commonly known as: FLORASTOR Take 250 mg by mouth 2 (two) times daily.   sodium chloride 0.9 % infusion Inject 250 mL/hr into the vein daily.   Systane 0.4-0.3 % Soln Generic drug: Polyethyl Glycol-Propyl Glycol Place 1 Application into both eyes every 12 (twelve) hours as needed (eye discomfort).   VITAMIN B-1 PO Take 1 tablet by mouth daily.   VITAMIN C PO Take 1 tablet by mouth at bedtime.   Vitamin D3 125 MCG (5000 UT) Caps Take 1 capsule (5,000 Units total) by mouth daily.        Follow-up Information     Pahwani, Rinka R, MD Follow up in 1 week(s).   Specialty: Internal Medicine Contact information: [address] [place] [address] [phone]                Allergies  Allergen Reactions   Sulfa Antibiotics Diarrhea, Nausea And Vomiting and Rash   Canagliflozin Diarrhea, Nausea And Vomiting and Other (See Comments)    Unknown   Clindamycin/Lincomycin Diarrhea and Nausea And Vomiting   Coenzyme Q10-Black Pepper    Latex     Rash, itching, over a long period of time   Metformin And Related Diarrhea and Nausea And Vomiting   Ozempic (0.25 Or 0.5 Mg-Dose) [Semaglutide(0.25 Or 0.5mg -Dos)] Diarrhea and Nausea And Vomiting   Polyurethane [Urethane]     Blistering and swelling   Tape Itching and Swelling    Reaction after a few days use/ please use paper tape   Cherry Itching, Rash, Swelling and Other (See Comments)    Throat swelling  Unknown    Throat swelling   Gabapentin Diarrhea, Nausea And Vomiting and Rash     Unknown   Other Itching, Swelling and Rash    Reaction to Hot peppers (throat swelling)   Sulfamethoxazole-Trimethoprim Diarrhea, Nausea And Vomiting, Rash and Itching    Consultations: None   Procedures/Studies: US RENAL  Result Date: 05/14/2023 CLINICAL DATA:  Acute kidney injury. EXAM: RENAL / URINARY TRACT ULTRASOUND COMPLETE COMPARISON:  Abdomen ultrasound dated 10/15/2007. FINDINGS:  Right Kidney: Renal measurements: 10.3 x 6.0 x 5.2 cm = volume: 167 ML. Echogenicity within normal limits. No mass or hydronephrosis visualized. Left Kidney: Renal measurements: 11.3 x 5.7 x 5.6 cm = volume: 191 mL. Echogenicity within normal limits. No mass or hydronephrosis visualized. Bladder: Not visualized. Other: The examination was mildly limited by patient body habitus. IMPRESSION: Normal examination. Electronically Signed   By: Beckie Salts M.D.   On: 05/14/2023 14:22    Subjective: Patient was seen and examined at bedside.  Overnight events noted.   Patient reports doing much better,  renal functions are improving and she wants to be discharged back to [facility].  Discharge Exam: Vitals:   05/16/23 2045 05/17/23 0424  BP: 139/62 (!) 125/49  Pulse: 72 (!) 58  Resp: 18 16  Temp: 98.8 F (37.1 C) 98.5 F (36.9 C)  SpO2: 99% 98%   Vitals:   05/16/23 0449 05/16/23 1438 05/16/23 2045 05/17/23 0424  BP: (!) 158/81 (!) 125/45 139/62 (!) 125/49  Pulse: 60 65 72 (!) 58  Resp: 20 19 18 16   Temp: 97.7 F (36.5 C) 98 F (36.7 C) 98.8 F (37.1 C) 98.5 F (36.9 C)  TempSrc: Oral Oral  Oral  SpO2: 100% 96% 99% 98%  Weight:      Height:        General: Pt is alert, awake, not in acute distress Cardiovascular: RRR, S1/S2 +, no rubs, no gallops Respiratory: CTA bilaterally, no wheezing, no rhonchi Abdominal: Soft, NT, ND, bowel sounds + Extremities: B/L AKA .    The results of significant diagnostics from this hospitalization (including imaging, microbiology, ancillary and  laboratory) are listed below for reference.     Microbiology: No results found for this or any previous visit (from the past 240 hour(s)).   Labs: BNP (last 3 results) No results for input(s): "BNP" in the last 8760 hours. Basic Metabolic Panel: Recent Labs  Lab 05/13/23 1339 05/13/23 2107 05/14/23 0434 05/15/23 0441 05/16/23 1155 05/17/23 0319 05/17/23 0814  NA 134* 137 139 140 140 137  --   K 4.3 4.5 4.3 4.4 4.7 5.2* 5.0  CL 103 107 108 109 109 109  --   CO2 18* 18* 21* 21* 24 22  --   GLUCOSE 229* 259* 230* 211* 250* 262*  --   BUN 98* 90* 90* 71* 56* 53*  --   CREATININE 4.43* 4.03* 3.64* 2.36* 1.97* 1.93*  --   CALCIUM 8.4* 8.6* 8.5* 8.5* 8.6* 8.6*  --   MG 1.9  --  1.8 1.9  --  1.9  --   PHOS  --   --   --  3.1  --  2.6  --    Liver Function Tests: Recent Labs  Lab 05/13/23 1339 05/14/23 0434  AST 14* 12*  ALT 14 13  ALKPHOS 105 95  BILITOT 0.4 0.4  PROT 7.3 6.8  ALBUMIN 3.2* 2.8*   No results for input(s): "LIPASE", "AMYLASE" in the last 168 hours. No results for input(s): "AMMONIA" in the last 168 hours. CBC: Recent Labs  Lab 05/13/23 1339 05/14/23 0434 05/15/23 0441 05/17/23 0319  WBC 6.6 6.5 7.0 8.4  NEUTROABS 4.7 4.3  --   --   HGB 10.7* 10.4* 11.0* 10.2*  HCT 32.9* 32.4* 34.7* 32.7*  MCV 94.0 95.9 96.4 95.1  PLT 332 311 305 317   Cardiac Enzymes: No results for input(s): "CKTOTAL", "CKMB", "CKMBINDEX", "TROPONINI" in the last 168 hours. BNP: Invalid input(s): "POCBNP"  CBG: Recent Labs  Lab 05/16/23 0732 05/16/23 1112 05/16/23 1627 05/16/23 2047 05/17/23 0725  GLUCAP 153* 210* 257* 271* 271*   D-Dimer No results for input(s): "DDIMER" in the last 72 hours. Hgb A1c No results for input(s): "HGBA1C" in the last 72 hours. Lipid Profile No results for input(s): "CHOL", "HDL", "LDLCALC", "TRIG", "CHOLHDL", "LDLDIRECT" in the last 72 hours. Thyroid function studies No results for input(s): "TSH", "T4TOTAL", "T3FREE", "THYROIDAB" in  the last 72 hours.  Invalid input(s): "FREET3" Anemia work up No results for input(s): "VITAMINB12", "FOLATE", "FERRITIN", "TIBC", "IRON", "RETICCTPCT" in the last 72 hours. Urinalysis    Component Value Date/Time   COLORURINE STRAW (A) 05/13/2023 1730   APPEARANCEUR HAZY (A) 05/13/2023 1730   LABSPEC 1.008 05/13/2023 1730   PHURINE 5.0 05/13/2023 1730   GLUCOSEU NEGATIVE 05/13/2023 1730   HGBUR NEGATIVE 05/13/2023 1730   BILIRUBINUR NEGATIVE 05/13/2023 1730   KETONESUR NEGATIVE 05/13/2023 1730   PROTEINUR NEGATIVE 05/13/2023 1730   UROBILINOGEN 0.2 08/05/2008 1400   NITRITE NEGATIVE 05/13/2023 1730   LEUKOCYTESUR MODERATE (A) 05/13/2023 1730   Sepsis Labs Recent Labs  Lab 05/13/23 1339 05/14/23 0434 05/15/23 0441 05/17/23 0319  WBC 6.6 6.5 7.0 8.4   Microbiology No results found for this or any previous visit (from the past 240 hour(s)).   Time coordinating discharge: Over 30 minutes  SIGNED:   Willeen Niece, MD  [facility] 05/17/2023, 10:37 AM Pager   If 7PM-7AM, please contact night-coverage

## 2023-05-17 NOTE — Progress Notes (Signed)
Nsg Discharge Note  Admit Date:  05/13/2023 Discharge date: 05/17/2023   Olena Mater to be D/C'd Skilled nursing facility per MD order.  AVS completed. Patient/caregiver able to verbalize understanding.  Discharge Medication: Allergies as of 05/17/2023       Reactions   Sulfa Antibiotics Diarrhea, Nausea And Vomiting, Rash   Canagliflozin Diarrhea, Nausea And Vomiting, Other (See Comments)   Unknown   Clindamycin/lincomycin Diarrhea, Nausea And Vomiting   Coenzyme Q10-black Pepper    Latex    Rash, itching, over a long period of time   Metformin And Related Diarrhea, Nausea And Vomiting   Ozempic (0.25 Or 0.5 Mg-dose) [semaglutide(0.25 Or 0.5mg -dos)] Diarrhea, Nausea And Vomiting   Polyurethane [urethane]    Blistering and swelling   Tape Itching, Swelling   Reaction after a few days use/ please use paper tape   Cherry Itching, Rash, Swelling, Other (See Comments)   Throat swelling Unknown    Throat swelling   Gabapentin Diarrhea, Nausea And Vomiting, Rash   Unknown   Other Itching, Swelling, Rash   Reaction to Hot peppers (throat swelling)   Sulfamethoxazole-trimethoprim Diarrhea, Nausea And Vomiting, Rash, Itching        Medication List     STOP taking these medications    lisinopril 20 MG tablet Commonly known as: ZESTRIL   torsemide 20 MG tablet Commonly known as: DEMADEX       TAKE these medications    acetaminophen 325 MG tablet Commonly known as: TYLENOL Take 650 mg by mouth every 4 (four) hours as needed for mild pain (pain score 1-3).   atorvastatin 80 MG tablet Commonly known as: LIPITOR Take 80 mg by mouth daily.   buPROPion ER 100 MG 12 hr tablet Commonly known as: WELLBUTRIN SR Take 100 mg by mouth every morning.   CALCIUM 500 PO Take 1 tablet by mouth daily.   carvedilol 12.5 MG tablet Commonly known as: Coreg Take 1 tablet (12.5 mg total) by mouth 2 (two) times daily.   cloNIDine 0.1 MG tablet Commonly known as: CATAPRES Take  0.1 mg by mouth at bedtime.   diphenhydrAMINE 25 mg capsule Commonly known as: BENADRYL Take 25 mg by mouth daily as needed (allergic reactions).   docusate sodium 100 MG capsule Commonly known as: COLACE Take 100 mg by mouth every 8 (eight) hours as needed for mild constipation.   FLUoxetine 40 MG capsule Commonly known as: PROZAC Take 40 mg by mouth at bedtime.   folic acid 1 MG tablet Commonly known as: FOLVITE Take 1 tablet (1 mg total) by mouth daily.   FreeStyle Libre 2 Reader Hardie Pulley As directed   [employer] 2 Sensor Misc 1 Piece by Does not apply route every 14 (fourteen) days.   hydroxypropyl methylcellulose / hypromellose 2.5 % ophthalmic solution Commonly known as: ISOPTO TEARS / GONIOVISC Place 1-2 drops into both eyes every 8 (eight) hours as needed for dry eyes (diabetic retinopathy). 1 drop for dry eyes and 2 drops for diabetic retinopathy   Iron 325 (65 Fe) MG Tabs Take 1 tablet by mouth at bedtime.   lamoTRIgine 25 MG tablet Commonly known as: LAMICTAL Take 25 mg by mouth 2 (two) times daily.   levothyroxine 150 MCG tablet Commonly known as: SYNTHROID Take 1 tablet (150 mcg total) by mouth daily before breakfast.   loperamide 2 MG capsule Commonly known as: IMODIUM Take 2 mg by mouth as needed for diarrhea or loose stools.   magnesium citrate Soln Take 296  mLs (1 Bottle total) by mouth daily as needed for severe constipation. What changed:  when to take this reasons to take this   metoCLOPramide 10 MG tablet Commonly known as: REGLAN Take 10 mg by mouth every 6 (six) hours as needed for nausea or vomiting.   Narcan 4 MG/0.1ML Liqd nasal spray kit Generic drug: naloxone Place 1 spray into the nose once.   NovoLOG FlexPen 100 UNIT/ML FlexPen Generic drug: insulin aspart Inject 1-14 Units into the skin 6 (six) times daily. Inject 14 units three times a day 10 minutes before meals, then 0-8 units with meals depending on blood sugar  reading. 151-200 = 1 201-250 = 2 251-300 = 4 301-350 = 6 351-400 = 8   nystatin powder Commonly known as: MYCOSTATIN/NYSTOP Apply 1 Application topically 3 (three) times daily. Under breasts for redness   omega-3 acid ethyl esters 1 g capsule Commonly known as: LOVAZA Take 1 capsule by mouth daily.   ondansetron 4 MG tablet Commonly known as: ZOFRAN Take 4 mg by mouth every 8 (eight) hours as needed for nausea or vomiting.   oxyCODONE 5 MG immediate release tablet Commonly known as: Oxy IR/ROXICODONE Take 0.5 tablets (2.5 mg total) by mouth every 4 (four) hours as needed for up to 3 days for moderate pain (pain score 4-6). What changed:  medication strength how much to take when to take this reasons to take this   pantoprazole 40 MG tablet Commonly known as: PROTONIX TAKE ONE (1) TABLET EACH DAY What changed: See the new instructions.   polyethylene glycol 17 g packet Commonly known as: MIRALAX / GLYCOLAX Take 17 g by mouth 2 (two) times daily as needed for mild constipation. What changed: when to take this   promethazine 25 MG suppository Commonly known as: PHENERGAN Place 1 suppository (25 mg total) rectally every 6 (six) hours as needed for nausea or vomiting.   saccharomyces boulardii 250 MG capsule Commonly known as: FLORASTOR Take 250 mg by mouth 2 (two) times daily.   sodium chloride 0.9 % infusion Inject 250 mL/hr into the vein daily.   Systane 0.4-0.3 % Soln Generic drug: Polyethyl Glycol-Propyl Glycol Place 1 Application into both eyes every 12 (twelve) hours as needed (eye discomfort).   VITAMIN B-1 PO Take 1 tablet by mouth daily.   VITAMIN C PO Take 1 tablet by mouth at bedtime.   Vitamin D3 125 MCG (5000 UT) Caps Take 1 capsule (5,000 Units total) by mouth daily.        Discharge Assessment: Vitals:   05/16/23 2045 05/17/23 0424  BP: 139/62 (!) 125/49  Pulse: 72 (!) 58  Resp: 18 16  Temp: 98.8 F (37.1 C) 98.5 F (36.9 C)  SpO2:  99% 98%   Skin clean, dry and intact without evidence of skin break down, no evidence of skin tears noted. IV catheter discontinued intact. Site without signs and symptoms of complications - no redness or edema noted at insertion site, patient denies c/o pain - only slight tenderness at site.  Dressing with slight pressure applied.  D/c Instructions-Education: Discharge instructions given to patient/family with verbalized understanding. D/c education completed with patient/family including follow up instructions, medication list, d/c activities limitations if indicated, with other d/c instructions as indicated by MD - patient able to verbalize understanding, all questions fully answered. Patient instructed to return to ED, call 911, or call MD for any changes in condition.  Patient escorted via WC, and D/C home via private auto.  Darel Hong  Rhae Lerner, RN 05/17/2023 11:26 AM

## 2023-05-17 NOTE — Progress Notes (Signed)
Attempted again to call report. With no answer.

## 2023-05-17 NOTE — TOC Transition Note (Signed)
Transition of Care [facility]) - CM/SW Discharge Note   Patient Details  Name: Michelle Skinner MRN: [id number] Date of Birth: 1965-10-05  [facility] of [facility]) CM/SW Contact:  Catalina Gravel, LCSW Phone Number: 05/17/2023, 10:54 AM   Clinical Narrative:     CSW determined pt ready for DC, and plan to return to LTC at [facility], CSW contacted [facility], then completed FL2.  Provided RN with RR information and called transportation.  No further TOC needs.   Final next level of care: Long Term Acute Care (LTAC) Barriers to Discharge: Continued Medical Work up   Patient Goals and CMS Choice CMS Medicare.gov Compare Post Acute Care list provided to:: Patient Choice offered to / list presented to : Patient  Discharge Placement                         Discharge Plan and Services Additional resources added to the After Visit Summary for                                       Social Determinants of Health (SDOH) Interventions SDOH Screenings   Food Insecurity: No Food Insecurity (05/13/2023)  Housing: Low Risk  (05/13/2023)  Transportation Needs: No Transportation Needs (05/13/2023)  Utilities: Not At Risk (05/13/2023)  Depression (PHQ2-9): Low Risk  (10/04/2020)  Tobacco Use: Low Risk  (05/13/2023)     Readmission Risk Interventions    05/17/2023   10:53 AM 05/14/2023    2:31 PM  Readmission Risk Prevention Plan  Transportation Screening Complete Complete  PCP or Specialist Appt within 5-7 Days  Complete  PCP or Specialist Appt within 3-5 Days Not Complete   Home Care Screening  Complete  Medication Review (RN CM)  Complete  HRI or Home Care Consult Not Complete   Social Work Consult for Recovery Care Planning/Counseling Complete   Palliative Care Screening Not Applicable   Medication Review [employer]) Complete

## 2023-05-17 NOTE — Progress Notes (Signed)
Pt has been awake all night, stated she had migraine but didn't want to take any medication for it. She did not mention the migraine during the night when I checked in on her.

## 2023-05-17 NOTE — Discharge Instructions (Signed)
Advised to recheck CBC and CMP in 1 week. Advised to hold lisinopril and torsemide given acute kidney injury. Resume lisinopril and torsemide once renal functions improves.

## 2023-05-17 NOTE — Plan of Care (Signed)

## 2023-05-19 DIAGNOSIS — I509 Heart failure, unspecified: Secondary | ICD-10-CM | POA: Diagnosis not present

## 2023-05-19 DIAGNOSIS — I10 Essential (primary) hypertension: Secondary | ICD-10-CM | POA: Diagnosis not present

## 2023-05-19 DIAGNOSIS — E860 Dehydration: Secondary | ICD-10-CM | POA: Diagnosis not present

## 2023-05-19 DIAGNOSIS — N179 Acute kidney failure, unspecified: Secondary | ICD-10-CM | POA: Diagnosis not present

## 2023-05-21 DIAGNOSIS — E860 Dehydration: Secondary | ICD-10-CM | POA: Diagnosis not present

## 2023-05-21 DIAGNOSIS — N179 Acute kidney failure, unspecified: Secondary | ICD-10-CM | POA: Diagnosis not present

## 2023-05-21 DIAGNOSIS — I509 Heart failure, unspecified: Secondary | ICD-10-CM | POA: Diagnosis not present

## 2023-05-22 DIAGNOSIS — R944 Abnormal results of kidney function studies: Secondary | ICD-10-CM | POA: Diagnosis not present

## 2023-05-22 DIAGNOSIS — K219 Gastro-esophageal reflux disease without esophagitis: Secondary | ICD-10-CM | POA: Diagnosis not present

## 2023-05-22 DIAGNOSIS — Z89511 Acquired absence of right leg below knee: Secondary | ICD-10-CM | POA: Diagnosis not present

## 2023-05-22 DIAGNOSIS — K3184 Gastroparesis: Secondary | ICD-10-CM | POA: Diagnosis not present

## 2023-05-22 DIAGNOSIS — F331 Major depressive disorder, recurrent, moderate: Secondary | ICD-10-CM | POA: Diagnosis not present

## 2023-05-22 DIAGNOSIS — E559 Vitamin D deficiency, unspecified: Secondary | ICD-10-CM | POA: Diagnosis not present

## 2023-05-22 DIAGNOSIS — F411 Generalized anxiety disorder: Secondary | ICD-10-CM | POA: Diagnosis not present

## 2023-05-22 DIAGNOSIS — E039 Hypothyroidism, unspecified: Secondary | ICD-10-CM | POA: Diagnosis not present

## 2023-05-22 DIAGNOSIS — Z89512 Acquired absence of left leg below knee: Secondary | ICD-10-CM | POA: Diagnosis not present

## 2023-05-22 DIAGNOSIS — F329 Major depressive disorder, single episode, unspecified: Secondary | ICD-10-CM | POA: Diagnosis not present

## 2023-05-22 DIAGNOSIS — R799 Abnormal finding of blood chemistry, unspecified: Secondary | ICD-10-CM | POA: Diagnosis not present

## 2023-05-22 DIAGNOSIS — N179 Acute kidney failure, unspecified: Secondary | ICD-10-CM | POA: Diagnosis not present

## 2023-05-23 DIAGNOSIS — F411 Generalized anxiety disorder: Secondary | ICD-10-CM | POA: Diagnosis not present

## 2023-05-23 DIAGNOSIS — F331 Major depressive disorder, recurrent, moderate: Secondary | ICD-10-CM | POA: Diagnosis not present

## 2023-05-25 DIAGNOSIS — E875 Hyperkalemia: Secondary | ICD-10-CM | POA: Diagnosis not present

## 2023-05-26 NOTE — Consult Note (Signed)
[facility] Liaison Note  05/26/2023  Michelle Skinner 14-Jun-1966 [id number]  Location: RN Hospital Liaison screened the patient remotely at [facility].  Insurance: [facility]   Michelle Skinner is a 57 y.o. female who is a Primary Care Patient of Pcp, No.The patient was screened for  day readmission hospitalization with noted high risk score for unplanned readmission risk with 1 IPin 6 months.  The patient was assessed for potential Care Management service needs for post hospital transition for care coordination. Review of patient's electronic medical record reveals patient was admitted for AKI. Pt discharged to [facility]. Facility will continue to address her ongoing needs. No VBCI services to render at this time.   VBCI Care Management/Population Health does not replace or interfere with any arrangements made by the Inpatient Transition of Care team.   For questions contact:   Elliot Cousin, RN, [facility] Liaison [place]   [facility], Population Health Office Hours MTWF  8:00 am-6:00 pm Direct Dial: [phone] mobile [phone] [Office toll free line] Office Hours are M-F 8:30 - 5 pm [name]

## 2023-05-29 DIAGNOSIS — N179 Acute kidney failure, unspecified: Secondary | ICD-10-CM | POA: Diagnosis not present

## 2023-05-29 DIAGNOSIS — R739 Hyperglycemia, unspecified: Secondary | ICD-10-CM | POA: Diagnosis not present

## 2023-06-02 DIAGNOSIS — R21 Rash and other nonspecific skin eruption: Secondary | ICD-10-CM | POA: Diagnosis not present

## 2023-06-02 DIAGNOSIS — S50822A Blister (nonthermal) of left forearm, initial encounter: Secondary | ICD-10-CM | POA: Diagnosis not present

## 2023-06-04 DIAGNOSIS — E1169 Type 2 diabetes mellitus with other specified complication: Secondary | ICD-10-CM | POA: Diagnosis not present

## 2023-06-04 DIAGNOSIS — R519 Headache, unspecified: Secondary | ICD-10-CM | POA: Diagnosis not present

## 2023-06-04 DIAGNOSIS — N179 Acute kidney failure, unspecified: Secondary | ICD-10-CM | POA: Diagnosis not present

## 2023-06-04 DIAGNOSIS — I10 Essential (primary) hypertension: Secondary | ICD-10-CM | POA: Diagnosis not present

## 2023-06-06 ENCOUNTER — Emergency Department (HOSPITAL_COMMUNITY): Payer: Medicare HMO

## 2023-06-06 ENCOUNTER — Emergency Department (HOSPITAL_COMMUNITY)
Admission: EM | Admit: 2023-06-06 | Discharge: 2023-06-07 | Disposition: A | Discharge status: Home / Self Care | Payer: Medicare HMO | Attending: Emergency Medicine | Admitting: Emergency Medicine

## 2023-06-06 ENCOUNTER — Other Ambulatory Visit: Payer: Self-pay

## 2023-06-06 DIAGNOSIS — Z7984 Long term (current) use of oral hypoglycemic drugs: Secondary | ICD-10-CM | POA: Insufficient documentation

## 2023-06-06 DIAGNOSIS — R110 Nausea: Secondary | ICD-10-CM | POA: Diagnosis not present

## 2023-06-06 DIAGNOSIS — Z91040 Latex allergy status: Secondary | ICD-10-CM | POA: Insufficient documentation

## 2023-06-06 DIAGNOSIS — Z794 Long term (current) use of insulin: Secondary | ICD-10-CM | POA: Insufficient documentation

## 2023-06-06 DIAGNOSIS — R6889 Other general symptoms and signs: Secondary | ICD-10-CM | POA: Diagnosis not present

## 2023-06-06 DIAGNOSIS — R112 Nausea with vomiting, unspecified: Secondary | ICD-10-CM | POA: Insufficient documentation

## 2023-06-06 DIAGNOSIS — K3184 Gastroparesis: Secondary | ICD-10-CM | POA: Diagnosis not present

## 2023-06-06 DIAGNOSIS — R109 Unspecified abdominal pain: Secondary | ICD-10-CM | POA: Diagnosis not present

## 2023-06-06 DIAGNOSIS — E1143 Type 2 diabetes mellitus with diabetic autonomic (poly)neuropathy: Secondary | ICD-10-CM | POA: Diagnosis not present

## 2023-06-06 DIAGNOSIS — Z743 Need for continuous supervision: Secondary | ICD-10-CM | POA: Diagnosis not present

## 2023-06-06 DIAGNOSIS — K219 Gastro-esophageal reflux disease without esophagitis: Secondary | ICD-10-CM | POA: Insufficient documentation

## 2023-06-06 DIAGNOSIS — R079 Chest pain, unspecified: Secondary | ICD-10-CM | POA: Insufficient documentation

## 2023-06-06 DIAGNOSIS — R001 Bradycardia, unspecified: Secondary | ICD-10-CM | POA: Diagnosis not present

## 2023-06-06 DIAGNOSIS — R1013 Epigastric pain: Secondary | ICD-10-CM | POA: Insufficient documentation

## 2023-06-06 DIAGNOSIS — R1110 Vomiting, unspecified: Secondary | ICD-10-CM | POA: Diagnosis not present

## 2023-06-06 DIAGNOSIS — R0989 Other specified symptoms and signs involving the circulatory and respiratory systems: Secondary | ICD-10-CM | POA: Diagnosis not present

## 2023-06-06 DIAGNOSIS — R918 Other nonspecific abnormal finding of lung field: Secondary | ICD-10-CM | POA: Diagnosis not present

## 2023-06-06 LAB — HEPATIC FUNCTION PANEL
ALT: 14 U/L (ref 0–44)
AST: 13 U/L — ABNORMAL LOW (ref 15–41)
Albumin: 2.6 g/dL — ABNORMAL LOW (ref 3.5–5.0)
Alkaline Phosphatase: 130 U/L — ABNORMAL HIGH (ref 38–126)
Bilirubin, Direct: 0.2 mg/dL (ref 0.0–0.2)
Indirect Bilirubin: 0.8 mg/dL (ref 0.3–0.9)
Total Bilirubin: 1 mg/dL (ref <1.2)
Total Protein: 7 g/dL (ref 6.5–8.1)

## 2023-06-06 LAB — CBC
HCT: 32 % — ABNORMAL LOW (ref 36.0–46.0)
Hemoglobin: 10.1 g/dL — ABNORMAL LOW (ref 12.0–15.0)
MCH: 29.8 pg (ref 26.0–34.0)
MCHC: 31.6 g/dL (ref 30.0–36.0)
MCV: 94.4 fL (ref 80.0–100.0)
Platelets: 253 10*3/uL (ref 150–400)
RBC: 3.39 MIL/uL — ABNORMAL LOW (ref 3.87–5.11)
RDW: 11.9 % (ref 11.5–15.5)
WBC: 7.2 10*3/uL (ref 4.0–10.5)
nRBC: 0 % (ref 0.0–0.2)

## 2023-06-06 LAB — BASIC METABOLIC PANEL
Anion gap: 10 (ref 5–15)
BUN: 16 mg/dL (ref 6–20)
CO2: 25 mmol/L (ref 22–32)
Calcium: 8.8 mg/dL — ABNORMAL LOW (ref 8.9–10.3)
Chloride: 105 mmol/L (ref 98–111)
Creatinine, Ser: 1.23 mg/dL — ABNORMAL HIGH (ref 0.44–1.00)
GFR, Estimated: 52 mL/min — ABNORMAL LOW (ref >60)
Glucose, Bld: 229 mg/dL — ABNORMAL HIGH (ref 70–99)
Potassium: 3.8 mmol/L (ref 3.5–5.1)
Sodium: 140 mmol/L (ref 135–145)

## 2023-06-06 LAB — LIPASE, BLOOD: Lipase: 21 U/L (ref 11–51)

## 2023-06-06 LAB — TROPONIN I (HIGH SENSITIVITY)
Troponin I (High Sensitivity): 11 ng/L (ref <18)
Troponin I (High Sensitivity): 10 ng/L (ref <18)

## 2023-06-06 MED ORDER — METOCLOPRAMIDE HCL 5 MG/ML IJ SOLN
10.0000 mg | Freq: Once | INTRAMUSCULAR | Status: AC
  Administered 2023-06-06: 10 mg via INTRAVENOUS
  Filled 2023-06-06: qty 2

## 2023-06-06 MED ORDER — METOCLOPRAMIDE HCL 10 MG PO TABS: 10.0000 mg | ORAL_TABLET | Freq: Four times a day (QID) | ORAL | 0 refills | Status: DC | PRN

## 2023-06-06 MED ORDER — ONDANSETRON HCL 4 MG/2ML IJ SOLN
4.0000 mg | Freq: Once | INTRAMUSCULAR | Status: AC
  Administered 2023-06-06: 4 mg via INTRAVENOUS
  Filled 2023-06-06: qty 2

## 2023-06-06 MED ORDER — METOCLOPRAMIDE HCL 10 MG PO TABS
10.0000 mg | ORAL_TABLET | Freq: Once | ORAL | Status: AC
  Administered 2023-06-06: 10 mg via ORAL
  Filled 2023-06-06: qty 1

## 2023-06-06 NOTE — ED Notes (Signed)
Blisters noted to LFA, covered with bandage from facility. Per pt no hx of chicken pox / shingles.

## 2023-06-06 NOTE — Progress Notes (Signed)
Heather at [facility] called and updated on pt.

## 2023-06-06 NOTE — ED Notes (Signed)
Pt resting comfortably at this time. Has not vomited since EMS reported on arrival. Holding Reglan at this time. VSS. Last CBG per pt Dexcom 191.

## 2023-06-06 NOTE — ED Notes (Signed)
Pt states her nausea coming back. Provider notified . Zofran  given.

## 2023-06-06 NOTE — ED Provider Notes (Signed)
[place] EMERGENCY DEPARTMENT AT [facility] Provider Note   CSN: [id number] Arrival date & time: 06/06/23  1136     History  Chief Complaint  Patient presents with   Emesis   Nausea    Michelle Skinner is a 57 y.o. female with medical history including type 2 diabetes, GERD, history of gastroparesis with frequent episodes of vomiting secondary to this condition presenting with a 3-day history of nausea and vomiting, stating she has been unable to keep any p.o. intake down in the past 3 days.  She is from [facility] center, she states she has had hospitalizations for this complaint and until just last week she actually had a PICC line which was for IV hydration, she denies fevers or chills, she does endorse pressure in her lower sternal upper epigastric region which she believes is secondary to the vomiting.  She denies shortness of breath, she had 1 episode of diarrhea yesterday but none today.  They gave her 3 doses of Zofran yesterday which she states was not helpful, she states Reglan has been helpful in the past.  The history is provided by the patient.       Home Medications Prior to Admission medications   Medication Sig Start Date End Date Taking? Authorizing Provider  metoCLOPramide (REGLAN) 10 MG tablet Take 1 tablet (10 mg total) by mouth every 6 (six) hours as needed for nausea or vomiting. 06/06/23  Yes Latishia Suitt, Raynelle Fanning, PA-C  acetaminophen (TYLENOL) 325 MG tablet Take 650 mg by mouth every 4 (four) hours as needed for mild pain (pain score 1-3).    [provider]  Ascorbic Acid (VITAMIN C PO) Take 1 tablet by mouth at bedtime.    [provider]  atorvastatin (LIPITOR) 80 MG tablet Take 80 mg by mouth daily. 04/11/23   [provider]  buPROPion ER (WELLBUTRIN SR) 100 MG 12 hr tablet Take 100 mg by mouth every morning.    [provider]  Calcium Carbonate (CALCIUM 500 PO) Take 1 tablet by mouth daily.    [provider]  carvedilol (COREG) 12.5 MG tablet Take 1 tablet (12.5 mg total) by mouth 2 (two) times daily. 09/17/20 05/13/23  [name], MD  Cholecalciferol (VITAMIN D3) 125 MCG (5000 UT) CAPS Take 1 capsule (5,000 Units total) by mouth daily. 05/13/18   [name], MD  cloNIDine (CATAPRES) 0.1 MG tablet Take 0.1 mg by mouth at bedtime.    [provider]  Continuous Glucose Receiver (FREESTYLE LIBRE 2 READER) DEVI As directed 04/29/23   [name], MD  Continuous Glucose Sensor (FREESTYLE LIBRE 2 SENSOR) MISC 1 Piece by Does not apply route every 14 (fourteen) days. 12/25/22   [name], MD  diphenhydrAMINE (BENADRYL) 25 mg capsule Take 25 mg by mouth daily as needed (allergic reactions).    [provider]  docusate sodium (COLACE) 100 MG capsule Take 100 mg by mouth every 8 (eight) hours as needed for mild constipation.    [provider]  Ferrous Sulfate (IRON) 325 (65 Fe) MG TABS Take 1 tablet by mouth at bedtime.    [provider]  FLUoxetine (PROZAC) 40 MG capsule Take 40 mg by mouth at bedtime.    [provider]  folic acid (FOLVITE) 1 MG tablet Take 1 tablet (1 mg total) by mouth daily. 09/17/20   [name], MD  hydroxypropyl methylcellulose / hypromellose (ISOPTO TEARS / GONIOVISC) 2.5 % ophthalmic  solution Place 1-2 drops into both eyes every 8 (eight) hours as needed for dry eyes (diabetic retinopathy). 1 drop for dry eyes and 2 drops for diabetic retinopathy    [provider]  lamoTRIgine (LAMICTAL) 25 MG tablet Take 25 mg by mouth 2 (two) times daily.    [provider]  levothyroxine (SYNTHROID) 150 MCG tablet Take 1 tablet (150 mcg total) by mouth daily before breakfast. 07/12/20   [name], [name], MD  loperamide (IMODIUM) 2 MG capsule Take 2 mg by mouth as needed for diarrhea or loose stools. 04/20/23   [provider]  magnesium citrate SOLN Take 296 mLs (1 Bottle  total) by mouth daily as needed for severe constipation. Patient taking differently: Take 1 Bottle by mouth every 8 (eight) hours as needed (laxative). 09/17/20   [name], MD  naloxone [facility]) nasal spray 4 mg/0.1 mL Place 1 spray into the nose once.    [provider]  NOVOLOG FLEXPEN 100 UNIT/ML FlexPen Inject 1-14 Units into the skin 6 (six) times daily. Inject 14 units three times a day 10 minutes before meals, then 0-8 units with meals depending on blood sugar reading. 151-200 = 1 201-250 = 2 251-300 = 4 301-350 = 6 351-400 = 8 05/08/23   [provider]  nystatin (MYCOSTATIN/NYSTOP) powder Apply 1 Application topically 3 (three) times daily. Under breasts for redness 03/13/23   [provider]  omega-3 acid ethyl esters (LOVAZA) 1 g capsule Take 1 capsule by mouth daily.    [provider]  ondansetron (ZOFRAN) 4 MG tablet Take 4 mg by mouth every 8 (eight) hours as needed for nausea or vomiting.    [provider]  pantoprazole (PROTONIX) 40 MG tablet TAKE ONE (1) TABLET EACH DAY Patient taking differently: Take 40 mg by mouth daily. 12/01/18   [name], MD  Polyethyl Glycol-Propyl Glycol (SYSTANE) 0.4-0.3 % SOLN Place 1 Application into both eyes every 12 (twelve) hours as needed (eye discomfort).    [provider]  polyethylene glycol (MIRALAX / GLYCOLAX) 17 g packet Take 17 g by mouth 2 (two) times daily as needed for mild constipation. Patient taking differently: Take 17 g by mouth daily as needed for mild constipation. 09/17/20   [name], MD  promethazine (PHENERGAN) 25 MG suppository Place 1 suppository (25 mg total) rectally every 6 (six) hours as needed for nausea or vomiting. 12/18/20   Mesner, Barbara Cower, MD  saccharomyces boulardii (FLORASTOR) 250 MG capsule Take 250 mg by mouth 2 (two) times daily.    [provider]  Thiamine HCl (VITAMIN B-1 PO) Take 1 tablet by mouth daily.    [provider]      Allergies    Sulfa antibiotics, Canagliflozin, Clindamycin/lincomycin, Coenzyme q10-black pepper, Latex, Metformin and related, Ozempic (0.25 or 0.5 mg-dose) [semaglutide(0.25 or 0.5mg -dos)], Polyurethane [urethane], Tape, Cherry, Gabapentin, Other, and Sulfamethoxazole-trimethoprim    Review of Systems   Review of Systems  Constitutional:  Negative for chills and fever.  HENT:  Negative for congestion and sore throat.   Eyes: Negative.   Respiratory:  Negative for chest tightness and shortness of breath.   Cardiovascular:  Positive for chest pain.  Gastrointestinal:  Positive for abdominal pain, nausea and vomiting.  Genitourinary: Negative.   Musculoskeletal:  Negative for arthralgias, joint swelling and neck pain.  Skin: Negative.  Negative for rash and wound.  Neurological:  Negative for dizziness, weakness, light-headedness, numbness and headaches.  Psychiatric/Behavioral: Negative.  All other systems reviewed and are negative.   Physical Exam Updated Vital Signs BP (!) 180/76 (BP Location: Right Wrist)   Pulse 70   Temp 97.8 F (36.6 C) (Axillary)   Resp 18   Ht 5' (1.524 m)   Wt (!) 199.6 kg   SpO2 94%   BMI 85.93 kg/m  Physical Exam Vitals and nursing note reviewed.  Constitutional:      Appearance: She is well-developed.  HENT:     Head: Normocephalic and atraumatic.     Mouth/Throat:     Mouth: Mucous membranes are moist.  Eyes:     Conjunctiva/sclera: Conjunctivae normal.  Cardiovascular:     Rate and Rhythm: Normal rate and regular rhythm.     Heart sounds: Normal heart sounds.  Pulmonary:     Effort: Pulmonary effort is normal.     Breath sounds: Normal breath sounds. No wheezing.  Abdominal:     General: Bowel sounds are normal.     Palpations: Abdomen is soft.     Tenderness: There is abdominal tenderness in the epigastric area. There is no guarding. Negative signs include Murphy's sign.  Musculoskeletal:        General: Normal range of  motion.     Cervical back: Normal range of motion.  Skin:    General: Skin is warm and dry.  Neurological:     Mental Status: She is alert.     ED Results / Procedures / Treatments   Labs (all labs ordered are listed, but only abnormal results are displayed) Labs Reviewed  BASIC METABOLIC PANEL - Abnormal; Notable for the following components:      Result Value   Glucose, Bld 229 (*)    Creatinine, Ser 1.23 (*)    Calcium 8.8 (*)    GFR, Estimated 52 (*)    All other components within normal limits  CBC - Abnormal; Notable for the following components:   RBC 3.39 (*)    Hemoglobin 10.1 (*)    HCT 32.0 (*)    All other components within normal limits  HEPATIC FUNCTION PANEL - Abnormal; Notable for the following components:   Albumin 2.6 (*)    AST 13 (*)    Alkaline Phosphatase 130 (*)    All other components within normal limits  LIPASE, BLOOD  TROPONIN I (HIGH SENSITIVITY)  TROPONIN I (HIGH SENSITIVITY)    EKG None ED ECG REPORT   Date: 06/06/2023  Rate: 59  Rhythm: sinus bradycardia  QRS Axis: left  Intervals: normal  ST/T Wave abnormalities: normal  Conduction Disutrbances:none  Narrative Interpretation:   Old EKG Reviewed: unchanged  I have personally reviewed the EKG tracing and agree with the computerized printout as noted.  Radiology DG Chest 2 View  Result Date: 06/06/2023 CLINICAL DATA:  Chest pain.  Vomiting. EXAM: CHEST - 2 VIEW COMPARISON:  09/28/2020. FINDINGS: Cardiac silhouette normal in size no mediastinal or hilar masses. There are low lung volumes. There is hazy opacity in the lower lungs at is not well-defined. Mild lower lung interstitial thickening. No convincing pleural effusion.  No pneumothorax. Skeletal structures are grossly intact. IMPRESSION: Low lung volumes. Hazy opacity in the lower lungs is nonspecific but could reflect atelectasis, pneumonia or pulmonary edema. Electronically Signed   By: Amie Portland M.D.   On: 06/06/2023 14:34     Procedures Procedures    Medications Ordered in ED Medications  metoCLOPramide (REGLAN) tablet 10 mg (has no administration in time range)  metoCLOPramide (REGLAN)  injection 10 mg (10 mg Intravenous Given 06/06/23 1409)    ED Course/ Medical Decision Making/ A&P                                 Medical Decision Making Patient presenting with return of her nausea and vomiting, she has a history of gastroparesis, Zofran has not been effective for her symptoms.  Resenting from a local nursing home, 3 doses of Zofran given yesterday were not effective.  She denies fevers or chills, she denies shortness of breath, cough, choking.  She has some mild discomfort in her epigastric area, she states she feels sore from the vomiting and dry heaving.  Denies chest pain.  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  Will also get troponins to rule out ACS.  Clinically she does not look dehydrated.  Will give Reglan and then attempt p.o. challenge assuming her labs are stable.  Amount and/or Complexity of Data Reviewed Labs: ordered.    Details: Labs reviewed, negative troponins, her lipase is normal, her hepatic function is also negative for elevated transaminases, she does have an elevated alk phos at 130.  Her be met is significant for glucose of 229, she does have some renal insufficiency with a creatinine of 1.23, this is actually improved from her baseline around 1.9. Radiology: ordered.    Details: Chest x-ray obtained, reviewed, nonspecific hazy opacities in her lower lungs, she is afebrile, denies shortness of breath and cough, doubt aspiration. ECG/medicine tests: ordered and independent interpretation performed.    Details: Reviewed, unchanged, borderline sinus bradycardia, LAD, no changes from EKG dated  05/13/2023  Risk Prescription drug management.           Final Clinical Impression(s) / ED Diagnoses Final diagnoses:  Nausea and vomiting, unspecified vomiting type    Rx / DC Orders ED Discharge Orders          Ordered    metoCLOPramide (REGLAN) 10 MG tablet  Every 6 hours PRN        06/06/23 1725              [name], PA-C 06/06/23 1824    Vanetta Mulders, MD 06/14/23 1540

## 2023-06-06 NOTE — ED Triage Notes (Signed)
Pt BIBA from [facility] for N/V. Has hx of Gastric Paresis with similar episodes requiring hospital admissions. Per pt "wanted to stay on top of it."   Vital per EMS: 153/88 HR 60 97% on RA CBG per pt Dexcom 203   Pt currently complains of 8/10 chest pain from vomiting.

## 2023-06-06 NOTE — Discharge Instructions (Signed)
Your lab test today are reassuring and stable.  I have prescribed you Reglan which may be a better medication to help you with your chronic nausea and vomiting associate with your gastroparesis.

## 2023-06-06 NOTE — ED Notes (Signed)
Pt has been sleeping. Awakes easily. Had held down half a cup of water. Has had no vomiting in ED

## 2023-06-06 NOTE — ED Notes (Signed)
Pt in radiology 

## 2023-06-06 NOTE — ED Notes (Signed)
Pt no longer vomiting- resting with eyes closed

## 2023-06-07 DIAGNOSIS — Z7401 Bed confinement status: Secondary | ICD-10-CM | POA: Diagnosis not present

## 2023-06-07 DIAGNOSIS — I10 Essential (primary) hypertension: Secondary | ICD-10-CM | POA: Diagnosis not present

## 2023-06-08 ENCOUNTER — Other Ambulatory Visit: Payer: Self-pay

## 2023-06-08 ENCOUNTER — Emergency Department (HOSPITAL_COMMUNITY)
Admission: EM | Admit: 2023-06-08 | Discharge: 2023-06-09 | Disposition: A | Discharge status: Home / Self Care | Payer: Medicare HMO | Attending: Emergency Medicine | Admitting: Emergency Medicine

## 2023-06-08 ENCOUNTER — Encounter (HOSPITAL_COMMUNITY): Payer: Self-pay

## 2023-06-08 DIAGNOSIS — R112 Nausea with vomiting, unspecified: Secondary | ICD-10-CM | POA: Insufficient documentation

## 2023-06-08 DIAGNOSIS — I10 Essential (primary) hypertension: Secondary | ICD-10-CM | POA: Diagnosis not present

## 2023-06-08 DIAGNOSIS — Z743 Need for continuous supervision: Secondary | ICD-10-CM | POA: Diagnosis not present

## 2023-06-08 DIAGNOSIS — Z79899 Other long term (current) drug therapy: Secondary | ICD-10-CM | POA: Diagnosis not present

## 2023-06-08 DIAGNOSIS — R1084 Generalized abdominal pain: Secondary | ICD-10-CM | POA: Diagnosis not present

## 2023-06-08 DIAGNOSIS — Z91040 Latex allergy status: Secondary | ICD-10-CM | POA: Insufficient documentation

## 2023-06-08 DIAGNOSIS — Z7984 Long term (current) use of oral hypoglycemic drugs: Secondary | ICD-10-CM | POA: Insufficient documentation

## 2023-06-08 DIAGNOSIS — R0902 Hypoxemia: Secondary | ICD-10-CM | POA: Diagnosis not present

## 2023-06-08 DIAGNOSIS — Z794 Long term (current) use of insulin: Secondary | ICD-10-CM | POA: Diagnosis not present

## 2023-06-08 DIAGNOSIS — R110 Nausea: Secondary | ICD-10-CM

## 2023-06-08 DIAGNOSIS — R9431 Abnormal electrocardiogram [ECG] [EKG]: Secondary | ICD-10-CM | POA: Diagnosis not present

## 2023-06-08 DIAGNOSIS — R109 Unspecified abdominal pain: Secondary | ICD-10-CM | POA: Diagnosis not present

## 2023-06-08 DIAGNOSIS — R6889 Other general symptoms and signs: Secondary | ICD-10-CM | POA: Diagnosis not present

## 2023-06-08 DIAGNOSIS — E119 Type 2 diabetes mellitus without complications: Secondary | ICD-10-CM | POA: Insufficient documentation

## 2023-06-08 LAB — CBC WITH DIFFERENTIAL/PLATELET
Abs Immature Granulocytes: 0.06 10*3/uL (ref 0.00–0.07)
Basophils Absolute: 0 10*3/uL (ref 0.0–0.1)
Basophils Relative: 0 %
Eosinophils Absolute: 0.1 10*3/uL (ref 0.0–0.5)
Eosinophils Relative: 1 %
HCT: 30.3 % — ABNORMAL LOW (ref 36.0–46.0)
Hemoglobin: 10 g/dL — ABNORMAL LOW (ref 12.0–15.0)
Immature Granulocytes: 1 %
Lymphocytes Relative: 9 %
Lymphs Abs: 0.8 10*3/uL (ref 0.7–4.0)
MCH: 30.5 pg (ref 26.0–34.0)
MCHC: 33 g/dL (ref 30.0–36.0)
MCV: 92.4 fL (ref 80.0–100.0)
Monocytes Absolute: 0.7 10*3/uL (ref 0.1–1.0)
Monocytes Relative: 8 %
Neutro Abs: 6.8 10*3/uL (ref 1.7–7.7)
Neutrophils Relative %: 81 %
Platelets: 295 10*3/uL (ref 150–400)
RBC: 3.28 MIL/uL — ABNORMAL LOW (ref 3.87–5.11)
RDW: 11.9 % (ref 11.5–15.5)
WBC: 8.4 10*3/uL (ref 4.0–10.5)
nRBC: 0 % (ref 0.0–0.2)

## 2023-06-08 LAB — URINALYSIS, ROUTINE W REFLEX MICROSCOPIC
Bilirubin Urine: NEGATIVE
Glucose, UA: 50 mg/dL — AB
Ketones, ur: NEGATIVE mg/dL
Nitrite: NEGATIVE
Protein, ur: >=300 mg/dL — AB
Specific Gravity, Urine: 1.015 (ref 1.005–1.030)
pH: 5 (ref 5.0–8.0)

## 2023-06-08 LAB — COMPREHENSIVE METABOLIC PANEL
ALT: 11 U/L (ref 0–44)
AST: 11 U/L — ABNORMAL LOW (ref 15–41)
Albumin: 2.5 g/dL — ABNORMAL LOW (ref 3.5–5.0)
Alkaline Phosphatase: 116 U/L (ref 38–126)
Anion gap: 11 (ref 5–15)
BUN: 20 mg/dL (ref 6–20)
CO2: 24 mmol/L (ref 22–32)
Calcium: 8.5 mg/dL — ABNORMAL LOW (ref 8.9–10.3)
Chloride: 104 mmol/L (ref 98–111)
Creatinine, Ser: 1.56 mg/dL — ABNORMAL HIGH (ref 0.44–1.00)
GFR, Estimated: 39 mL/min — ABNORMAL LOW (ref >60)
Glucose, Bld: 169 mg/dL — ABNORMAL HIGH (ref 70–99)
Potassium: 3.3 mmol/L — ABNORMAL LOW (ref 3.5–5.1)
Sodium: 139 mmol/L (ref 135–145)
Total Bilirubin: 0.7 mg/dL (ref <1.2)
Total Protein: 6.3 g/dL — ABNORMAL LOW (ref 6.5–8.1)

## 2023-06-08 LAB — BLOOD GAS, VENOUS
Acid-Base Excess: 3.8 mmol/L — ABNORMAL HIGH (ref 0.0–2.0)
Bicarbonate: 29.2 mmol/L — ABNORMAL HIGH (ref 20.0–28.0)
Drawn by: 7049
O2 Saturation: 81.6 %
Patient temperature: 36.7
pCO2, Ven: 45 mm[Hg] (ref 44–60)
pH, Ven: 7.41 (ref 7.25–7.43)
pO2, Ven: 46 mm[Hg] — ABNORMAL HIGH (ref 32–45)

## 2023-06-08 LAB — LIPASE, BLOOD: Lipase: 26 U/L (ref 11–51)

## 2023-06-08 MED ORDER — METOCLOPRAMIDE HCL 5 MG/ML IJ SOLN
10.0000 mg | Freq: Once | INTRAMUSCULAR | Status: AC
Start: 2023-06-08 — End: 2023-06-08
  Administered 2023-06-08: 10 mg via INTRAVENOUS
  Filled 2023-06-08: qty 2

## 2023-06-08 MED ORDER — MORPHINE SULFATE (PF) 4 MG/ML IV SOLN
4.0000 mg | Freq: Once | INTRAVENOUS | Status: AC
  Administered 2023-06-08: 4 mg via INTRAVENOUS
  Filled 2023-06-08: qty 1

## 2023-06-08 NOTE — ED Provider Notes (Signed)
Michelle Skinner Provider Note   CSN: [id number] Arrival date & time: 06/08/23  1748     History  Chief Complaint  Patient presents with   Abdominal Pain    Michelle Skinner is a 57 y.o. female.  With a history of type 2 diabetes, hypertension, hyperlipidemia and recurrent abdominal pain who presents to the ED for abdominal pain.  Persistent nausea vomiting and generalized abdominal pain for the last 3 days.  Zofran has been ineffective in relieving her nausea and vomiting at her skilled nursing facility.  Denies chest pain, fevers, chills and changes in bowel habits.   Abdominal Pain      Home Medications Prior to Admission medications   Medication Sig Start Date End Date Taking? Authorizing Provider  acetaminophen (TYLENOL) 325 MG tablet Take 650 mg by mouth every 4 (four) hours as needed for mild pain (pain score 1-3).    [provider]  Ascorbic Acid (VITAMIN C PO) Take 1 tablet by mouth at bedtime.    [provider]  atorvastatin (LIPITOR) 80 MG tablet Take 80 mg by mouth daily. 04/11/23   [provider]  buPROPion ER (WELLBUTRIN SR) 100 MG 12 hr tablet Take 100 mg by mouth every morning.    [provider]  Calcium Carbonate (CALCIUM 500 PO) Take 1 tablet by mouth daily.    [provider]  carvedilol (COREG) 12.5 MG tablet Take 1 tablet (12.5 mg total) by mouth 2 (two) times daily. 09/17/20 05/13/23  Michelle Hercules, MD  Cholecalciferol (VITAMIN D3) 125 MCG (5000 UT) CAPS Take 1 capsule (5,000 Units total) by mouth daily. 05/13/18   Michelle Kayser, MD  cloNIDine (CATAPRES) 0.1 MG tablet Take 0.1 mg by mouth at bedtime.    [provider]  Continuous Glucose Receiver (FREESTYLE LIBRE 2 READER) DEVI As directed 04/29/23   Michelle Kayser, MD  Continuous Glucose Sensor (FREESTYLE LIBRE 2 SENSOR) MISC 1 Piece by Does not apply route every 14 (fourteen) days. 12/25/22   Michelle Kayser, MD  diphenhydrAMINE (BENADRYL) 25 mg capsule Take 25 mg by mouth daily as needed (allergic reactions).    [provider]  docusate sodium (COLACE) 100 MG capsule Take 100 mg by mouth every 8 (eight) hours as needed for mild constipation.    [provider]  Ferrous Sulfate (IRON) 325 (65 Fe) MG TABS Take 1 tablet by mouth at bedtime.    [provider]  FLUoxetine (PROZAC) 40 MG capsule Take 40 mg by mouth at bedtime.    [provider]  folic acid (FOLVITE) 1 MG tablet Take 1 tablet (1 mg total) by mouth daily. 09/17/20   Michelle Hercules, MD  hydroxypropyl methylcellulose / hypromellose (ISOPTO TEARS / GONIOVISC) 2.5 % ophthalmic solution Place 1-2 drops into both eyes every 8 (eight) hours as needed for dry eyes (diabetic retinopathy). 1 drop for dry eyes and 2 drops for diabetic retinopathy    [provider]  lamoTRIgine (LAMICTAL) 25 MG tablet Take 25 mg by mouth 2 (two) times daily.    [provider]  levothyroxine (SYNTHROID) 150 MCG tablet Take 1 tablet (150 mcg total) by mouth daily before breakfast. 07/12/20   [name], [name], MD  loperamide (IMODIUM) 2 MG capsule Take 2 mg by mouth as needed for diarrhea or loose stools. 04/20/23   [provider]  magnesium citrate SOLN Take 296 mLs (1 Bottle total) by mouth daily  as needed for severe constipation. Patient taking differently: Take 1 Bottle by mouth every 8 (eight) hours as needed (laxative). 09/17/20   Michelle Hercules, MD  metoCLOPramide (REGLAN) 10 MG tablet Take 1 tablet (10 mg total) by mouth every 6 (six) hours as needed for nausea or vomiting. 06/06/23   Michelle Amor, PA-C  naloxone [facility] nasal spray 4 mg/0.1 mL Place 1 spray into the nose once.    [provider]  NOVOLOG FLEXPEN 100 UNIT/ML FlexPen Inject 1-14 Units into the skin 6 (six) times daily. Inject 14 units three times a day 10 minutes before meals, then 0-8 units with meals depending  on blood sugar reading. 151-200 = 1 201-250 = 2 251-300 = 4 301-350 = 6 351-400 = 8 05/08/23   [provider]  nystatin (MYCOSTATIN/NYSTOP) powder Apply 1 Application topically 3 (three) times daily. Under breasts for redness 03/13/23   [provider]  omega-3 acid ethyl esters (LOVAZA) 1 g capsule Take 1 capsule by mouth daily.    [provider]  ondansetron (ZOFRAN) 4 MG tablet Take 4 mg by mouth every 8 (eight) hours as needed for nausea or vomiting.    [provider]  pantoprazole (PROTONIX) 40 MG tablet TAKE ONE (1) TABLET EACH DAY Patient taking differently: Take 40 mg by mouth daily. 12/01/18   Michelle Kayser, MD  Polyethyl Glycol-Propyl Glycol (SYSTANE) 0.4-0.3 % SOLN Place 1 Application into both eyes every 12 (twelve) hours as needed (eye discomfort).    [provider]  polyethylene glycol (MIRALAX / GLYCOLAX) 17 g packet Take 17 g by mouth 2 (two) times daily as needed for mild constipation. Patient taking differently: Take 17 g by mouth daily as needed for mild constipation. 09/17/20   Michelle Hercules, MD  promethazine (PHENERGAN) 25 MG suppository Place 1 suppository (25 mg total) rectally every 6 (six) hours as needed for nausea or vomiting. 12/18/20   Skinner, Michelle Cower, MD  saccharomyces boulardii (FLORASTOR) 250 MG capsule Take 250 mg by mouth 2 (two) times daily.    [provider]  Thiamine HCl (VITAMIN B-1 PO) Take 1 tablet by mouth daily.    [provider]      Allergies    Sulfa antibiotics, Canagliflozin, Clindamycin/lincomycin, Coenzyme q10-black pepper, Latex, Metformin and related, Ozempic (0.25 or 0.5 mg-dose) [semaglutide(0.25 or 0.5mg -dos)], Polyurethane [urethane], Tape, Cherry, Gabapentin, Other, and Sulfamethoxazole-trimethoprim    Review of Systems   Review of Systems  Gastrointestinal:  Positive for abdominal pain.    Physical Exam Updated Vital Signs BP (!) 195/70   Pulse 63   Temp 98 F  (36.7 C) (Oral)   Resp (!) 21   Ht 5' (1.524 m)   Wt (!) 192.8 kg   SpO2 93%   BMI 83.00 kg/m  Physical Exam Vitals and nursing note reviewed.  HENT:     Head: Normocephalic and atraumatic.  Eyes:     Pupils: Pupils are equal, round, and reactive to light.  Cardiovascular:     Rate and Rhythm: Normal rate and regular rhythm.  Pulmonary:     Effort: Pulmonary effort is normal.     Breath sounds: Normal breath sounds.  Abdominal:     Palpations: Abdomen is soft.     Tenderness: There is abdominal tenderness.     Comments: Obese abdomen Mild diffuse abdominal tenderness without rebound rigidity guarding  Skin:    General: Skin is warm and dry.  Neurological:     Mental Status:  She is alert.  Psychiatric:        Mood and Affect: Mood normal.     ED Results / Procedures / Treatments   Labs (all labs ordered are listed, but only abnormal results are displayed) Labs Reviewed  COMPREHENSIVE METABOLIC PANEL - Abnormal; Notable for the following components:      Result Value   Potassium 3.3 (*)    Glucose, Bld 169 (*)    Creatinine, Ser 1.56 (*)    Calcium 8.5 (*)    Total Protein 6.3 (*)    Albumin 2.5 (*)    AST 11 (*)    GFR, Estimated 39 (*)    All other components within normal limits  CBC WITH DIFFERENTIAL/PLATELET - Abnormal; Notable for the following components:   RBC 3.28 (*)    Hemoglobin 10.0 (*)    HCT 30.3 (*)    All other components within normal limits  URINALYSIS, ROUTINE W REFLEX MICROSCOPIC - Abnormal; Notable for the following components:   APPearance HAZY (*)    Glucose, UA 50 (*)    Hgb urine dipstick SMALL (*)    Protein, ur >=300 (*)    Leukocytes,Ua SMALL (*)    Bacteria, UA RARE (*)    All other components within normal limits  BLOOD GAS, VENOUS - Abnormal; Notable for the following components:   pO2, Ven 46 (*)    Bicarbonate 29.2 (*)    Acid-Base Excess 3.8 (*)    All other components within normal limits  LIPASE, BLOOD     EKG None  Radiology No results found.  Procedures Procedures    Medications Ordered in ED Medications  morphine (PF) 4 MG/ML injection 4 mg (4 mg Intravenous Given 06/08/23 1954)  metoCLOPramide (REGLAN) injection 10 mg (10 mg Intravenous Given 06/08/23 1954)    ED Course/ Medical Decision Making/ A&P Clinical Course as of 06/08/23 2136  Sun Jun 08, 2023  2134 Renal function slightly elevated from baseline but not consistent with AKI.  Contaminated urine sample not consistent with UTI but will send for culture remainder of laboratory workup unremarkable overall.  Patient reports significant improvement in her abdominal discomfort and nausea after a dose of morphine and Reglan here.  Stable for discharge back to her facility at this time.  She will follow-up with her primary care doctor [MP]    Clinical Course User Index [MP] Royanne Foots, DO                                 Medical Decision Making 57 year old female with history as above returns for abdominal pain nausea and vomiting.  Was recently seen in this ED 2 days ago for the same complaint which resolved after Reglan.  Zofran has provided no relief of nausea and vomiting.  Mild diffuse abdominal tenderness but otherwise benign exam.  Will trial 4 mg morphine and Reglan for nausea and vomiting and reassess.  Will obtain laboratory workup to look at her renal function, liver function, evaluate for leukocytosis and anemia.  Differential diagnosis includes: Acute intra-abdominal infectious/inflammatory process such as appendicitis, diverticulitis, pancreatitis and cholecystitis Urinary tract infection Atypical presentation for pneumonia Viral gastroenteritis   Amount and/or Complexity of Data Reviewed Labs: ordered.  Risk Prescription drug management.           Final Clinical Impression(s) / ED Diagnoses Final diagnoses:  Generalized abdominal pain  Nausea    Rx / DC  Orders ED Discharge Orders      None         Royanne Foots, DO 06/08/23 2139

## 2023-06-08 NOTE — ED Triage Notes (Signed)
Pt was here recently and went back to facility but abdominal pain has not improved.

## 2023-06-08 NOTE — Discharge Instructions (Signed)
You were seen in the Emergency Department for abdominal pain nausea and vomiting Your symptoms resolved after 1 dose of 4 mg morphine and 1 dose of 10 mg Reglan Your blood work looked okay tonight You need to follow-up with your primary care doctor for reevaluation within the next week Return to the Emergency Department for severe abdominal pain, uncontrolled vomiting or any other concerns

## 2023-06-08 NOTE — ED Notes (Signed)
Pt given drink per request

## 2023-06-08 NOTE — ED Notes (Signed)
When applying purwick, patient refused to have the brief removed. York Spaniel it can be done at a later time.

## 2023-06-09 DIAGNOSIS — R6889 Other general symptoms and signs: Secondary | ICD-10-CM | POA: Diagnosis not present

## 2023-06-09 DIAGNOSIS — K3184 Gastroparesis: Secondary | ICD-10-CM | POA: Diagnosis not present

## 2023-06-09 DIAGNOSIS — R109 Unspecified abdominal pain: Secondary | ICD-10-CM | POA: Diagnosis not present

## 2023-06-09 DIAGNOSIS — Z7401 Bed confinement status: Secondary | ICD-10-CM | POA: Diagnosis not present

## 2023-06-09 DIAGNOSIS — R1084 Generalized abdominal pain: Secondary | ICD-10-CM | POA: Diagnosis not present

## 2023-06-09 DIAGNOSIS — R112 Nausea with vomiting, unspecified: Secondary | ICD-10-CM | POA: Diagnosis not present

## 2023-06-09 MED ORDER — ACETAMINOPHEN 500 MG PO TABS
1000.0000 mg | ORAL_TABLET | Freq: Once | ORAL | Status: AC
  Administered 2023-06-09: 1000 mg via ORAL
  Filled 2023-06-09: qty 2

## 2023-06-09 MED ORDER — PANTOPRAZOLE SODIUM 40 MG PO TBEC
40.0000 mg | DELAYED_RELEASE_TABLET | Freq: Once | ORAL | Status: AC
  Administered 2023-06-09: 40 mg via ORAL
  Filled 2023-06-09: qty 1

## 2023-06-09 MED ORDER — CLONIDINE HCL 0.1 MG PO TABS
0.1000 mg | ORAL_TABLET | Freq: Once | ORAL | Status: AC
  Administered 2023-06-09: 0.1 mg via ORAL
  Filled 2023-06-09: qty 1

## 2023-06-09 NOTE — ED Notes (Signed)
ED Provider at bedside. 

## 2023-06-09 NOTE — ED Provider Notes (Signed)
I was informed by nursing that patient was waiting for transportation back to the facility. She was here for abdominal pain. Now she is having chest pain. No associated symptoms such as sob, emesis, light headedness, diaphoresis.   EKG Interpretation Date/Time:  Monday June 09 2023 02:02:21 EST Ventricular Rate:  69 PR Interval:  178 QRS Duration:  116 QT Interval:  475 QTC Calculation: 509 R Axis:   -56  Text Interpretation: Sinus rhythm LAD, consider left anterior fascicular block Low voltage, precordial leads Borderline T abnormalities, inferior leads Confirmed by Marily Memos [phone]) on 06/09/2023 4:53:41 AM        Patient persistently HTN here which is consistent with her baseline as it appears on multiple previous visits. Had been taken off of her lisinopril 2/2 aki recently, so will hold on that. Will give dose of clonidine, home tylenol and protonix.    ECG reassuring.  Patient sleeping. Pain improved. BP still high, doubt it was related. Doubt ACS at this time. Still stable for d/c.    Malick Netz, Barbara Cower, MD 06/09/23 [phone]

## 2023-06-12 DIAGNOSIS — R109 Unspecified abdominal pain: Secondary | ICD-10-CM | POA: Diagnosis not present

## 2023-06-12 DIAGNOSIS — E1169 Type 2 diabetes mellitus with other specified complication: Secondary | ICD-10-CM | POA: Diagnosis not present

## 2023-06-13 DIAGNOSIS — F331 Major depressive disorder, recurrent, moderate: Secondary | ICD-10-CM | POA: Diagnosis not present

## 2023-06-13 DIAGNOSIS — R600 Localized edema: Secondary | ICD-10-CM | POA: Diagnosis not present

## 2023-06-13 DIAGNOSIS — I10 Essential (primary) hypertension: Secondary | ICD-10-CM | POA: Diagnosis not present

## 2023-06-13 DIAGNOSIS — F3189 Other bipolar disorder: Secondary | ICD-10-CM | POA: Diagnosis not present

## 2023-06-13 DIAGNOSIS — F411 Generalized anxiety disorder: Secondary | ICD-10-CM | POA: Diagnosis not present

## 2023-06-20 DIAGNOSIS — F331 Major depressive disorder, recurrent, moderate: Secondary | ICD-10-CM | POA: Diagnosis not present

## 2023-06-20 DIAGNOSIS — F411 Generalized anxiety disorder: Secondary | ICD-10-CM | POA: Diagnosis not present

## 2023-06-23 DIAGNOSIS — I509 Heart failure, unspecified: Secondary | ICD-10-CM | POA: Diagnosis not present

## 2023-06-23 DIAGNOSIS — I10 Essential (primary) hypertension: Secondary | ICD-10-CM | POA: Diagnosis not present

## 2023-06-23 DIAGNOSIS — I2510 Atherosclerotic heart disease of native coronary artery without angina pectoris: Secondary | ICD-10-CM | POA: Diagnosis not present

## 2023-06-30 ENCOUNTER — Emergency Department (HOSPITAL_COMMUNITY): Payer: Medicare HMO

## 2023-06-30 ENCOUNTER — Encounter (HOSPITAL_COMMUNITY): Payer: Self-pay | Admitting: Emergency Medicine

## 2023-06-30 ENCOUNTER — Emergency Department (HOSPITAL_COMMUNITY)
Admission: EM | Admit: 2023-06-30 | Discharge: 2023-07-01 | Disposition: A | Discharge status: Home / Self Care | Payer: Medicare HMO | Attending: Emergency Medicine | Admitting: Emergency Medicine

## 2023-06-30 ENCOUNTER — Other Ambulatory Visit: Payer: Self-pay

## 2023-06-30 DIAGNOSIS — W19XXXA Unspecified fall, initial encounter: Secondary | ICD-10-CM

## 2023-06-30 DIAGNOSIS — M25562 Pain in left knee: Secondary | ICD-10-CM | POA: Diagnosis not present

## 2023-06-30 DIAGNOSIS — Z89512 Acquired absence of left leg below knee: Secondary | ICD-10-CM | POA: Insufficient documentation

## 2023-06-30 DIAGNOSIS — M79661 Pain in right lower leg: Secondary | ICD-10-CM | POA: Insufficient documentation

## 2023-06-30 DIAGNOSIS — R6889 Other general symptoms and signs: Secondary | ICD-10-CM | POA: Diagnosis not present

## 2023-06-30 DIAGNOSIS — R739 Hyperglycemia, unspecified: Secondary | ICD-10-CM | POA: Diagnosis not present

## 2023-06-30 DIAGNOSIS — Z91040 Latex allergy status: Secondary | ICD-10-CM | POA: Diagnosis not present

## 2023-06-30 DIAGNOSIS — Z89511 Acquired absence of right leg below knee: Secondary | ICD-10-CM | POA: Insufficient documentation

## 2023-06-30 DIAGNOSIS — M79662 Pain in left lower leg: Secondary | ICD-10-CM | POA: Diagnosis not present

## 2023-06-30 DIAGNOSIS — M25561 Pain in right knee: Secondary | ICD-10-CM | POA: Diagnosis not present

## 2023-06-30 DIAGNOSIS — Z743 Need for continuous supervision: Secondary | ICD-10-CM | POA: Diagnosis not present

## 2023-06-30 DIAGNOSIS — M79652 Pain in left thigh: Secondary | ICD-10-CM | POA: Diagnosis not present

## 2023-06-30 DIAGNOSIS — M1712 Unilateral primary osteoarthritis, left knee: Secondary | ICD-10-CM | POA: Diagnosis not present

## 2023-06-30 DIAGNOSIS — M1611 Unilateral primary osteoarthritis, right hip: Secondary | ICD-10-CM | POA: Diagnosis not present

## 2023-06-30 DIAGNOSIS — W050XXA Fall from non-moving wheelchair, initial encounter: Secondary | ICD-10-CM | POA: Diagnosis not present

## 2023-06-30 DIAGNOSIS — M79651 Pain in right thigh: Secondary | ICD-10-CM | POA: Diagnosis not present

## 2023-06-30 DIAGNOSIS — M1711 Unilateral primary osteoarthritis, right knee: Secondary | ICD-10-CM | POA: Diagnosis not present

## 2023-06-30 MED ORDER — MORPHINE SULFATE (PF) 4 MG/ML IV SOLN
4.0000 mg | Freq: Once | INTRAVENOUS | Status: AC
  Administered 2023-06-30: 4 mg via INTRAMUSCULAR
  Filled 2023-06-30: qty 1

## 2023-06-30 MED ORDER — HYDROCODONE-ACETAMINOPHEN 5-325 MG PO TABS
1.0000 | ORAL_TABLET | Freq: Once | ORAL | Status: AC
  Administered 2023-06-30: 1 via ORAL
  Filled 2023-06-30: qty 1

## 2023-06-30 NOTE — ED Provider Notes (Signed)
[place] EMERGENCY DEPARTMENT AT [facility] Provider Note   CSN: [id number] Arrival date & time: 06/30/23  2016     History  Chief Complaint  Patient presents with   Michelle Skinner is a 57 y.o. female with extensive medical history here for evaluation of fall.  She was getting ready to get shower when she was being wheeled into the shower when the wheelchair front legs caught she landed directly on her bilateral stumps.  She has had pain since then.  She took a home dose of her home oxycodone which helped however did not relieve the pain.  She denies hitting her head, LOC or anticoagulation.  No new numbness or weakness.  She uses a wheelchair at baseline.  She states she has some chronic redness to the bottom of her stumps from "irritation from sheets."  No fever.  Was otherwise well prior to fall.  HPI     Home Medications Prior to Admission medications   Medication Sig Start Date End Date Taking? Authorizing Provider  acetaminophen (TYLENOL) 325 MG tablet Take 650 mg by mouth every 4 (four) hours as needed for mild pain (pain score 1-3).    [provider]  Ascorbic Acid (VITAMIN C PO) Take 1 tablet by mouth at bedtime.    [provider]  atorvastatin (LIPITOR) 80 MG tablet Take 80 mg by mouth daily. 04/11/23   [provider]  buPROPion ER (WELLBUTRIN SR) 100 MG 12 hr tablet Take 100 mg by mouth every morning.    [provider]  Calcium Carbonate (CALCIUM 500 PO) Take 1 tablet by mouth daily.    [provider]  carvedilol (COREG) 12.5 MG tablet Take 1 tablet (12.5 mg total) by mouth 2 (two) times daily. 09/17/20 05/13/23  [name], MD  Cholecalciferol (VITAMIN D3) 125 MCG (5000 UT) CAPS Take 1 capsule (5,000 Units total) by mouth daily. 05/13/18   [name], MD  cloNIDine (CATAPRES) 0.1 MG tablet Take 0.1 mg by mouth at bedtime.    [provider]  Continuous Glucose Receiver (FREESTYLE  LIBRE 2 READER) DEVI As directed 04/29/23   [name], MD  Continuous Glucose Sensor (FREESTYLE LIBRE 2 SENSOR) MISC 1 Piece by Does not apply route every 14 (fourteen) days. 12/25/22   [name], MD  diphenhydrAMINE (BENADRYL) 25 mg capsule Take 25 mg by mouth daily as needed (allergic reactions).    [provider]  docusate sodium (COLACE) 100 MG capsule Take 100 mg by mouth every 8 (eight) hours as needed for mild constipation.    [provider]  Ferrous Sulfate (IRON) 325 (65 Fe) MG TABS Take 1 tablet by mouth at bedtime.    [provider]  FLUoxetine (PROZAC) 40 MG capsule Take 40 mg by mouth at bedtime.    [provider]  folic acid (FOLVITE) 1 MG tablet Take 1 tablet (1 mg total) by mouth daily. 09/17/20   [name], MD  hydroxypropyl methylcellulose / hypromellose (ISOPTO TEARS / GONIOVISC) 2.5 % ophthalmic solution Place 1-2 drops into both eyes every 8 (eight) hours as needed for dry eyes (diabetic retinopathy). 1 drop for dry eyes and 2 drops for diabetic retinopathy    [provider]  lamoTRIgine (LAMICTAL) 25 MG tablet Take 25 mg by mouth 2 (two) times daily.    [provider]  levothyroxine (SYNTHROID) 150 MCG tablet Take 1 tablet (150 mcg total) by mouth  daily before breakfast. 07/12/20   [name], [name], MD  loperamide (IMODIUM) 2 MG capsule Take 2 mg by mouth as needed for diarrhea or loose stools. 04/20/23   [provider]  magnesium citrate SOLN Take 296 mLs (1 Bottle total) by mouth daily as needed for severe constipation. Patient taking differently: Take 1 Bottle by mouth every 8 (eight) hours as needed (laxative). 09/17/20   [name], MD  metoCLOPramide (REGLAN) 10 MG tablet Take 1 tablet (10 mg total) by mouth every 6 (six) hours as needed for nausea or vomiting. 06/06/23   [name], PA-C  naloxone [facility]) nasal spray 4 mg/0.1 mL Place 1 spray into the nose once.     [provider]  NOVOLOG FLEXPEN 100 UNIT/ML FlexPen Inject 1-14 Units into the skin 6 (six) times daily. Inject 14 units three times a day 10 minutes before meals, then 0-8 units with meals depending on blood sugar reading. 151-200 = 1 201-250 = 2 251-300 = 4 301-350 = 6 351-400 = 8 05/08/23   [provider]  nystatin (MYCOSTATIN/NYSTOP) powder Apply 1 Application topically 3 (three) times daily. Under breasts for redness 03/13/23   [provider]  omega-3 acid ethyl esters (LOVAZA) 1 g capsule Take 1 capsule by mouth daily.    [provider]  ondansetron (ZOFRAN) 4 MG tablet Take 4 mg by mouth every 8 (eight) hours as needed for nausea or vomiting.    [provider]  pantoprazole (PROTONIX) 40 MG tablet TAKE ONE (1) TABLET EACH DAY Patient taking differently: Take 40 mg by mouth daily. 12/01/18   [name], MD  Polyethyl Glycol-Propyl Glycol (SYSTANE) 0.4-0.3 % SOLN Place 1 Application into both eyes every 12 (twelve) hours as needed (eye discomfort).    [provider]  polyethylene glycol (MIRALAX / GLYCOLAX) 17 g packet Take 17 g by mouth 2 (two) times daily as needed for mild constipation. Patient taking differently: Take 17 g by mouth daily as needed for mild constipation. 09/17/20   [name], MD  promethazine (PHENERGAN) 25 MG suppository Place 1 suppository (25 mg total) rectally every 6 (six) hours as needed for nausea or vomiting. 12/18/20   Mesner, Barbara Cower, MD  saccharomyces boulardii (FLORASTOR) 250 MG capsule Take 250 mg by mouth 2 (two) times daily.    [provider]  Thiamine HCl (VITAMIN B-1 PO) Take 1 tablet by mouth daily.    [provider]      Allergies    Sulfa antibiotics, Canagliflozin, Clindamycin/lincomycin, Coenzyme q10-black pepper, Latex, Metformin and related, Ozempic (0.25 or 0.5 mg-dose) [semaglutide(0.25 or 0.5mg -dos)], Polyurethane [polyurethane], Tape, Cherry, Gabapentin,  Other, and Sulfamethoxazole-trimethoprim    Review of Systems   Review of Systems  Constitutional: Negative.   HENT: Negative.    Respiratory: Negative.    Cardiovascular: Negative.   Gastrointestinal: Negative.   Genitourinary: Negative.   Musculoskeletal:        BIL stump pain   Skin: Negative.   Neurological: Negative.   All other systems reviewed and are negative.   Physical Exam Updated Vital Signs BP (!) 142/80   Pulse 71   Temp 98.1 F (36.7 C)   Resp 18   Ht 5' (1.524 m)   Wt (!) 193 kg   SpO2 100%   BMI 83.10 kg/m  Physical Exam Vitals and nursing note reviewed.  Constitutional:      General: She is not in acute distress.    Appearance: She is  well-developed. She is obese. She is not ill-appearing.  HENT:     Head: Normocephalic and atraumatic.  Eyes:     Pupils: Pupils are equal, round, and reactive to light.  Cardiovascular:     Rate and Rhythm: Normal rate.  Pulmonary:     Effort: No respiratory distress.  Abdominal:     General: There is no distension.  Musculoskeletal:        General: Normal range of motion.     Cervical back: Normal range of motion.     Comments: Bilateral BKA.  Diffuse tenderness to bilateral lower extremities distal femur and stump bilaterally.  She has chronic soft tissue changes to her bilateral BKA  Skin:    General: Skin is warm and dry.     Comments: Stump incisions C/D/I.  No fluctuance, induration, crepitus  Neurological:     General: No focal deficit present.     Mental Status: She is alert.  Psychiatric:        Mood and Affect: Mood normal.     ED Results / Procedures / Treatments   Labs (all labs ordered are listed, but only abnormal results are displayed) Labs Reviewed - No data to display  EKG None  Radiology No results found.  Procedures Procedures    Medications Ordered in ED Medications  morphine (PF) 4 MG/ML injection 4 mg (4 mg Intramuscular Given 06/30/23 2053)  HYDROcodone-acetaminophen  (NORCO/VICODIN) 5-325 MG per tablet 1 tablet (1 tablet Oral Given 06/30/23 2312)   ED Course/ Medical Decision Making/ A&P   57 year old multiple medical problems here for evaluation after mechanical fall out of wheelchair earlier today.  Fell on her bilateral lower extremity BKA's.  Has had pain since.  No new numbness or weakness.  Denies any head injury.  Will plan on pain management here, imaging and reassess  Imaging personally viewed and interpreted: pending   Care transferred to Dr. Bebe Shaggy who will follow-up on imaging and disposition, anticipate if her imaging without significant abnormality she can discharge back to her nursing facility                                 Medical Decision Making Amount and/or Complexity of Data Reviewed External Data Reviewed: labs, radiology and notes. Radiology: ordered and independent interpretation performed. Decision-making details documented in ED Course.  Risk OTC drugs. Prescription drug management. Decision regarding hospitalization. Diagnosis or treatment significantly limited by social determinants of health.          Final Clinical Impression(s) / ED Diagnoses Final diagnoses:  Fall, initial encounter  S/P bilateral BKA (below knee amputation) [facility])    Rx / DC Orders ED Discharge Orders     None         Zaidyn Claire A, PA-C 06/30/23 2358    Zadie Rhine, MD 07/01/23 [phone]

## 2023-06-30 NOTE — ED Triage Notes (Signed)
Pt fell out of wheelchair on bilateral  stumps today around 1320. Pt tried taking pain meds but did not help with pain.

## 2023-07-01 DIAGNOSIS — I160 Hypertensive urgency: Secondary | ICD-10-CM | POA: Diagnosis not present

## 2023-07-01 DIAGNOSIS — F3189 Other bipolar disorder: Secondary | ICD-10-CM | POA: Diagnosis not present

## 2023-07-01 DIAGNOSIS — M79661 Pain in right lower leg: Secondary | ICD-10-CM | POA: Diagnosis not present

## 2023-07-01 DIAGNOSIS — R296 Repeated falls: Secondary | ICD-10-CM | POA: Diagnosis not present

## 2023-07-01 DIAGNOSIS — Z7401 Bed confinement status: Secondary | ICD-10-CM | POA: Diagnosis not present

## 2023-07-01 DIAGNOSIS — I10 Essential (primary) hypertension: Secondary | ICD-10-CM | POA: Diagnosis not present

## 2023-07-01 MED ORDER — CLONIDINE HCL 0.1 MG PO TABS
0.1000 mg | ORAL_TABLET | Freq: Once | ORAL | Status: AC
  Administered 2023-07-01: 0.1 mg via ORAL
  Filled 2023-07-01: qty 1

## 2023-07-01 MED ORDER — HYDROCODONE-ACETAMINOPHEN 5-325 MG PO TABS
1.0000 | ORAL_TABLET | Freq: Once | ORAL | Status: AC
  Administered 2023-07-01: 1 via ORAL
  Filled 2023-07-01: qty 1

## 2023-07-04 DIAGNOSIS — G546 Phantom limb syndrome with pain: Secondary | ICD-10-CM | POA: Diagnosis not present

## 2023-07-04 DIAGNOSIS — G894 Chronic pain syndrome: Secondary | ICD-10-CM | POA: Diagnosis not present

## 2023-07-08 DIAGNOSIS — E039 Hypothyroidism, unspecified: Secondary | ICD-10-CM | POA: Diagnosis not present

## 2023-07-08 DIAGNOSIS — E1159 Type 2 diabetes mellitus with other circulatory complications: Secondary | ICD-10-CM | POA: Diagnosis not present

## 2023-07-08 DIAGNOSIS — E782 Mixed hyperlipidemia: Secondary | ICD-10-CM | POA: Diagnosis not present

## 2023-07-08 DIAGNOSIS — E559 Vitamin D deficiency, unspecified: Secondary | ICD-10-CM | POA: Diagnosis not present

## 2023-07-23 ENCOUNTER — Encounter (HOSPITAL_COMMUNITY): Payer: Self-pay

## 2023-07-23 ENCOUNTER — Other Ambulatory Visit: Payer: Self-pay

## 2023-07-23 ENCOUNTER — Inpatient Hospital Stay (HOSPITAL_COMMUNITY)
Admission: EM | Admit: 2023-07-23 | Discharge: 2023-07-26 | DRG: 392 | Disposition: A | Discharge status: Skilled Nursing Facility | Payer: 59 | Source: Skilled Nursing Facility | Attending: Internal Medicine | Admitting: Internal Medicine

## 2023-07-23 ENCOUNTER — Emergency Department (HOSPITAL_COMMUNITY): Payer: 59

## 2023-07-23 DIAGNOSIS — Z90710 Acquired absence of both cervix and uterus: Secondary | ICD-10-CM

## 2023-07-23 DIAGNOSIS — K760 Fatty (change of) liver, not elsewhere classified: Secondary | ICD-10-CM | POA: Diagnosis present

## 2023-07-23 DIAGNOSIS — E876 Hypokalemia: Secondary | ICD-10-CM | POA: Diagnosis present

## 2023-07-23 DIAGNOSIS — F4310 Post-traumatic stress disorder, unspecified: Secondary | ICD-10-CM | POA: Diagnosis present

## 2023-07-23 DIAGNOSIS — E1143 Type 2 diabetes mellitus with diabetic autonomic (poly)neuropathy: Secondary | ICD-10-CM | POA: Diagnosis present

## 2023-07-23 DIAGNOSIS — E11319 Type 2 diabetes mellitus with unspecified diabetic retinopathy without macular edema: Secondary | ICD-10-CM | POA: Diagnosis present

## 2023-07-23 DIAGNOSIS — R112 Nausea with vomiting, unspecified: Secondary | ICD-10-CM | POA: Diagnosis not present

## 2023-07-23 DIAGNOSIS — K3184 Gastroparesis: Secondary | ICD-10-CM | POA: Diagnosis present

## 2023-07-23 DIAGNOSIS — Z89511 Acquired absence of right leg below knee: Secondary | ICD-10-CM | POA: Diagnosis not present

## 2023-07-23 DIAGNOSIS — E785 Hyperlipidemia, unspecified: Secondary | ICD-10-CM | POA: Diagnosis present

## 2023-07-23 DIAGNOSIS — I129 Hypertensive chronic kidney disease with stage 1 through stage 4 chronic kidney disease, or unspecified chronic kidney disease: Secondary | ICD-10-CM | POA: Diagnosis present

## 2023-07-23 DIAGNOSIS — E039 Hypothyroidism, unspecified: Secondary | ICD-10-CM | POA: Diagnosis present

## 2023-07-23 DIAGNOSIS — K219 Gastro-esophageal reflux disease without esophagitis: Secondary | ICD-10-CM | POA: Diagnosis present

## 2023-07-23 DIAGNOSIS — Z23 Encounter for immunization: Secondary | ICD-10-CM | POA: Diagnosis present

## 2023-07-23 DIAGNOSIS — I160 Hypertensive urgency: Secondary | ICD-10-CM | POA: Diagnosis present

## 2023-07-23 DIAGNOSIS — E1122 Type 2 diabetes mellitus with diabetic chronic kidney disease: Secondary | ICD-10-CM | POA: Diagnosis present

## 2023-07-23 DIAGNOSIS — A084 Viral intestinal infection, unspecified: Secondary | ICD-10-CM | POA: Diagnosis present

## 2023-07-23 DIAGNOSIS — I444 Left anterior fascicular block: Secondary | ICD-10-CM | POA: Diagnosis present

## 2023-07-23 DIAGNOSIS — Z882 Allergy status to sulfonamides status: Secondary | ICD-10-CM

## 2023-07-23 DIAGNOSIS — F39 Unspecified mood [affective] disorder: Secondary | ICD-10-CM | POA: Diagnosis present

## 2023-07-23 DIAGNOSIS — D631 Anemia in chronic kidney disease: Secondary | ICD-10-CM | POA: Diagnosis present

## 2023-07-23 DIAGNOSIS — Z79899 Other long term (current) drug therapy: Secondary | ICD-10-CM

## 2023-07-23 DIAGNOSIS — Z6841 Body Mass Index (BMI) 40.0 and over, adult: Secondary | ICD-10-CM

## 2023-07-23 DIAGNOSIS — N1832 Chronic kidney disease, stage 3b: Secondary | ICD-10-CM | POA: Diagnosis present

## 2023-07-23 DIAGNOSIS — Z794 Long term (current) use of insulin: Secondary | ICD-10-CM | POA: Diagnosis not present

## 2023-07-23 DIAGNOSIS — Z823 Family history of stroke: Secondary | ICD-10-CM

## 2023-07-23 DIAGNOSIS — E66813 Obesity, class 3: Secondary | ICD-10-CM | POA: Diagnosis present

## 2023-07-23 DIAGNOSIS — Z8249 Family history of ischemic heart disease and other diseases of the circulatory system: Secondary | ICD-10-CM

## 2023-07-23 DIAGNOSIS — R1010 Upper abdominal pain, unspecified: Secondary | ICD-10-CM | POA: Diagnosis not present

## 2023-07-23 DIAGNOSIS — K529 Noninfective gastroenteritis and colitis, unspecified: Secondary | ICD-10-CM

## 2023-07-23 DIAGNOSIS — E1151 Type 2 diabetes mellitus with diabetic peripheral angiopathy without gangrene: Secondary | ICD-10-CM | POA: Diagnosis present

## 2023-07-23 DIAGNOSIS — Z89512 Acquired absence of left leg below knee: Secondary | ICD-10-CM

## 2023-07-23 DIAGNOSIS — Z79890 Hormone replacement therapy (postmenopausal): Secondary | ICD-10-CM

## 2023-07-23 DIAGNOSIS — Z833 Family history of diabetes mellitus: Secondary | ICD-10-CM

## 2023-07-23 DIAGNOSIS — I10 Essential (primary) hypertension: Secondary | ICD-10-CM | POA: Diagnosis present

## 2023-07-23 DIAGNOSIS — Z83438 Family history of other disorder of lipoprotein metabolism and other lipidemia: Secondary | ICD-10-CM

## 2023-07-23 DIAGNOSIS — R197 Diarrhea, unspecified: Secondary | ICD-10-CM | POA: Diagnosis not present

## 2023-07-23 DIAGNOSIS — Z8262 Family history of osteoporosis: Secondary | ICD-10-CM

## 2023-07-23 DIAGNOSIS — Z881 Allergy status to other antibiotic agents status: Secondary | ICD-10-CM

## 2023-07-23 DIAGNOSIS — Z888 Allergy status to other drugs, medicaments and biological substances status: Secondary | ICD-10-CM

## 2023-07-23 DIAGNOSIS — Z8541 Personal history of malignant neoplasm of cervix uteri: Secondary | ICD-10-CM

## 2023-07-23 LAB — COMPREHENSIVE METABOLIC PANEL
ALT: 14 U/L (ref 0–44)
AST: 16 U/L (ref 15–41)
Albumin: 3 g/dL — ABNORMAL LOW (ref 3.5–5.0)
Alkaline Phosphatase: 95 U/L (ref 38–126)
Anion gap: 9 (ref 5–15)
BUN: 13 mg/dL (ref 6–20)
CO2: 31 mmol/L (ref 22–32)
Calcium: 8.1 mg/dL — ABNORMAL LOW (ref 8.9–10.3)
Chloride: 98 mmol/L (ref 98–111)
Creatinine, Ser: 1.33 mg/dL — ABNORMAL HIGH (ref 0.44–1.00)
GFR, Estimated: 47 mL/min — ABNORMAL LOW (ref >60)
Glucose, Bld: 218 mg/dL — ABNORMAL HIGH (ref 70–99)
Potassium: 2.9 mmol/L — ABNORMAL LOW (ref 3.5–5.1)
Sodium: 138 mmol/L (ref 135–145)
Total Bilirubin: 0.9 mg/dL (ref 0.0–1.2)
Total Protein: 6.8 g/dL (ref 6.5–8.1)

## 2023-07-23 LAB — URINALYSIS, ROUTINE W REFLEX MICROSCOPIC
Bilirubin Urine: NEGATIVE
Glucose, UA: 50 mg/dL — AB
Ketones, ur: NEGATIVE mg/dL
Leukocytes,Ua: NEGATIVE
Nitrite: NEGATIVE
Protein, ur: 100 mg/dL — AB
Specific Gravity, Urine: 1.005 (ref 1.005–1.030)
pH: 7 (ref 5.0–8.0)

## 2023-07-23 LAB — CBC WITH DIFFERENTIAL/PLATELET
Abs Immature Granulocytes: 0.03 10*3/uL (ref 0.00–0.07)
Basophils Absolute: 0 10*3/uL (ref 0.0–0.1)
Basophils Relative: 0 %
Eosinophils Absolute: 0 10*3/uL (ref 0.0–0.5)
Eosinophils Relative: 1 %
HCT: 33.4 % — ABNORMAL LOW (ref 36.0–46.0)
Hemoglobin: 11 g/dL — ABNORMAL LOW (ref 12.0–15.0)
Immature Granulocytes: 0 %
Lymphocytes Relative: 14 %
Lymphs Abs: 1 10*3/uL (ref 0.7–4.0)
MCH: 29.6 pg (ref 26.0–34.0)
MCHC: 32.9 g/dL (ref 30.0–36.0)
MCV: 90 fL (ref 80.0–100.0)
Monocytes Absolute: 0.5 10*3/uL (ref 0.1–1.0)
Monocytes Relative: 7 %
Neutro Abs: 5.9 10*3/uL (ref 1.7–7.7)
Neutrophils Relative %: 78 %
Platelets: 268 10*3/uL (ref 150–400)
RBC: 3.71 MIL/uL — ABNORMAL LOW (ref 3.87–5.11)
RDW: 12.5 % (ref 11.5–15.5)
WBC: 7.5 10*3/uL (ref 4.0–10.5)
nRBC: 0 % (ref 0.0–0.2)

## 2023-07-23 LAB — LIPASE, BLOOD: Lipase: 23 U/L (ref 11–51)

## 2023-07-23 MED ORDER — LABETALOL HCL 5 MG/ML IV SOLN
10.0000 mg | Freq: Once | INTRAVENOUS | Status: AC
  Administered 2023-07-23: 10 mg via INTRAVENOUS
  Filled 2023-07-23: qty 4

## 2023-07-23 MED ORDER — POTASSIUM CHLORIDE 10 MEQ/100ML IV SOLN
10.0000 meq | INTRAVENOUS | Status: AC
  Administered 2023-07-23 (×2): 10 meq via INTRAVENOUS
  Filled 2023-07-23 (×2): qty 100

## 2023-07-23 MED ORDER — LACTATED RINGERS IV SOLN
INTRAVENOUS | Status: DC
Start: 2023-07-23 — End: 2023-07-24

## 2023-07-23 MED ORDER — LORAZEPAM 2 MG/ML IJ SOLN
0.5000 mg | INTRAMUSCULAR | Status: DC | PRN
  Administered 2023-07-24: 0.5 mg via INTRAVENOUS
  Filled 2023-07-23: qty 1

## 2023-07-23 MED ORDER — SODIUM CHLORIDE 0.9% FLUSH
3.0000 mL | Freq: Two times a day (BID) | INTRAVENOUS | Status: DC
  Administered 2023-07-24 – 2023-07-26 (×3): 3 mL via INTRAVENOUS

## 2023-07-23 MED ORDER — LORAZEPAM 2 MG/ML IJ SOLN
1.0000 mg | Freq: Once | INTRAMUSCULAR | Status: AC
  Administered 2023-07-23: 1 mg via INTRAVENOUS
  Filled 2023-07-23: qty 1

## 2023-07-23 MED ORDER — MELATONIN 3 MG PO TABS: 6.0000 mg | ORAL_TABLET | Freq: Every evening | ORAL | Status: DC | PRN

## 2023-07-23 MED ORDER — ONDANSETRON HCL 4 MG/2ML IJ SOLN
4.0000 mg | Freq: Once | INTRAMUSCULAR | Status: AC
  Administered 2023-07-23: 4 mg via INTRAVENOUS
  Filled 2023-07-23: qty 2

## 2023-07-23 MED ORDER — LABETALOL HCL 5 MG/ML IV SOLN
10.0000 mg | INTRAVENOUS | Status: DC | PRN
  Administered 2023-07-24: 10 mg via INTRAVENOUS
  Filled 2023-07-23: qty 4

## 2023-07-23 MED ORDER — ENOXAPARIN SODIUM 80 MG/0.8ML IJ SOSY
80.0000 mg | PREFILLED_SYRINGE | INTRAMUSCULAR | Status: DC
  Administered 2023-07-24 – 2023-07-26 (×3): 80 mg via SUBCUTANEOUS
  Filled 2023-07-23 (×3): qty 0.8

## 2023-07-23 MED ORDER — POTASSIUM CHLORIDE 10 MEQ/100ML IV SOLN
10.0000 meq | INTRAVENOUS | Status: AC
  Administered 2023-07-24 (×4): 10 meq via INTRAVENOUS
  Filled 2023-07-23 (×4): qty 100

## 2023-07-23 MED ORDER — ALBUTEROL SULFATE (2.5 MG/3ML) 0.083% IN NEBU: 2.5000 mg | INHALATION_SOLUTION | RESPIRATORY_TRACT | Status: DC | PRN

## 2023-07-23 MED ORDER — ACETAMINOPHEN 500 MG PO TABS
1000.0000 mg | ORAL_TABLET | Freq: Four times a day (QID) | ORAL | Status: DC | PRN
  Administered 2023-07-24 – 2023-07-25 (×2): 1000 mg via ORAL
  Filled 2023-07-23 (×2): qty 2

## 2023-07-23 MED ORDER — IOHEXOL 300 MG/ML  SOLN
100.0000 mL | Freq: Once | INTRAMUSCULAR | Status: AC | PRN
  Administered 2023-07-23: 100 mL via INTRAVENOUS

## 2023-07-23 MED ORDER — SODIUM CHLORIDE 0.9 % IV BOLUS
1000.0000 mL | Freq: Once | INTRAVENOUS | Status: AC
  Administered 2023-07-23: 1000 mL via INTRAVENOUS

## 2023-07-23 MED ORDER — POLYETHYLENE GLYCOL 3350 17 G PO PACK: 17.0000 g | PACK | Freq: Every day | ORAL | Status: DC | PRN

## 2023-07-23 NOTE — ED Triage Notes (Signed)
 Pt arrived via [facility] from [facility] c/o on-going N/V/D with abdominal pain X 5 days.

## 2023-07-23 NOTE — ED Provider Notes (Signed)
[place] EMERGENCY DEPARTMENT AT [facility] Provider Note   CSN: [id number] Arrival date & time: 07/23/23  1610     History  Chief Complaint  Patient presents with   Abdominal Pain    ALLINE ROTAN is a 58 y.o. female.   Abdominal Pain Associated symptoms: nausea and vomiting   Associated symptoms: no chest pain, no chills, no cough, no dysuria, no fever and no shortness of breath        LURLIE MOOTY is a 58 y.o. female here by EMS from [facility] with past medical history of type 2 diabetes, GERD, PTSD, neuropathy and bilateral BKA's who presents to the Emergency Department complaining of nausea vomiting diarrhea x 5 days.  Symptoms also been associated with generalized abdominal pain.  She endorses pain along her mid to upper abdomen presents with vomiting and dry heaving.  Decreased appetite.  Describes having watery diarrhea yesterday, no reported black or bloody stools, no diarrhea today .  No hematemesis.  Denies known sick contacts.  No fever or chills.  Patient noted to be hypertensive on arrival, states she has not been able to keep down her antihypertensive medications due to her persistent vomiting  Home Medications Prior to Admission medications   Medication Sig Start Date End Date Taking? Authorizing Provider  acetaminophen (TYLENOL) 325 MG tablet Take 650 mg by mouth every 4 (four) hours as needed for mild pain (pain score 1-3).    [provider]  Ascorbic Acid (VITAMIN C PO) Take 1 tablet by mouth at bedtime.    [provider]  atorvastatin (LIPITOR) 80 MG tablet Take 80 mg by mouth daily. 04/11/23   [provider]  buPROPion ER (WELLBUTRIN SR) 100 MG 12 hr tablet Take 100 mg by mouth every morning.    [provider]  Calcium Carbonate (CALCIUM 500 PO) Take 1 tablet by mouth daily.    [provider]  carvedilol (COREG) 12.5 MG tablet Take 1 tablet (12.5 mg total) by mouth 2 (two) times daily.  09/17/20 05/13/23  [name], MD  Cholecalciferol (VITAMIN D3) 125 MCG (5000 UT) CAPS Take 1 capsule (5,000 Units total) by mouth daily. 05/13/18   [name], MD  cloNIDine (CATAPRES) 0.1 MG tablet Take 0.1 mg by mouth at bedtime.    [provider]  Continuous Glucose Receiver (FREESTYLE LIBRE 2 READER) DEVI As directed 04/29/23   [name], MD  Continuous Glucose Sensor (FREESTYLE LIBRE 2 SENSOR) MISC 1 Piece by Does not apply route every 14 (fourteen) days. 12/25/22   [name], MD  diphenhydrAMINE (BENADRYL) 25 mg capsule Take 25 mg by mouth daily as needed (allergic reactions).    [provider]  docusate sodium (COLACE) 100 MG capsule Take 100 mg by mouth every 8 (eight) hours as needed for mild constipation.    [provider]  Ferrous Sulfate (IRON) 325 (65 Fe) MG TABS Take 1 tablet by mouth at bedtime.    [provider]  FLUoxetine (PROZAC) 40 MG capsule Take 40 mg by mouth at bedtime.    [provider]  folic acid (FOLVITE) 1 MG tablet Take 1 tablet (1 mg total) by mouth daily. 09/17/20   [name], MD  hydroxypropyl methylcellulose / hypromellose (ISOPTO TEARS / GONIOVISC) 2.5 % ophthalmic solution Place 1-2 drops into both eyes every 8 (eight) hours as needed for dry eyes (diabetic retinopathy). 1 drop for dry eyes and 2 drops  for diabetic retinopathy    [provider]  lamoTRIgine (LAMICTAL) 25 MG tablet Take 25 mg by mouth 2 (two) times daily.    [provider]  levothyroxine (SYNTHROID) 150 MCG tablet Take 1 tablet (150 mcg total) by mouth daily before breakfast. 07/12/20   [name], [name], MD  loperamide (IMODIUM) 2 MG capsule Take 2 mg by mouth as needed for diarrhea or loose stools. 04/20/23   [provider]  magnesium citrate SOLN Take 296 mLs (1 Bottle total) by mouth daily as needed for severe constipation. Patient taking differently: Take 1 Bottle by mouth  every 8 (eight) hours as needed (laxative). 09/17/20   [name], MD  metoCLOPramide (REGLAN) 10 MG tablet Take 1 tablet (10 mg total) by mouth every 6 (six) hours as needed for nausea or vomiting. 06/06/23   [name], PA-C  naloxone [facility]) nasal spray 4 mg/0.1 mL Place 1 spray into the nose once.    [provider]  NOVOLOG FLEXPEN 100 UNIT/ML FlexPen Inject 1-14 Units into the skin 6 (six) times daily. Inject 14 units three times a day 10 minutes before meals, then 0-8 units with meals depending on blood sugar reading. 151-200 = 1 201-250 = 2 251-300 = 4 301-350 = 6 351-400 = 8 05/08/23   [provider]  nystatin (MYCOSTATIN/NYSTOP) powder Apply 1 Application topically 3 (three) times daily. Under breasts for redness 03/13/23   [provider]  omega-3 acid ethyl esters (LOVAZA) 1 g capsule Take 1 capsule by mouth daily.    [provider]  ondansetron (ZOFRAN) 4 MG tablet Take 4 mg by mouth every 8 (eight) hours as needed for nausea or vomiting.    [provider]  pantoprazole (PROTONIX) 40 MG tablet TAKE ONE (1) TABLET EACH DAY Patient taking differently: Take 40 mg by mouth daily. 12/01/18   [name], MD  Polyethyl Glycol-Propyl Glycol (SYSTANE) 0.4-0.3 % SOLN Place 1 Application into both eyes every 12 (twelve) hours as needed (eye discomfort).    [provider]  polyethylene glycol (MIRALAX / GLYCOLAX) 17 g packet Take 17 g by mouth 2 (two) times daily as needed for mild constipation. Patient taking differently: Take 17 g by mouth daily as needed for mild constipation. 09/17/20   [name], MD  promethazine (PHENERGAN) 25 MG suppository Place 1 suppository (25 mg total) rectally every 6 (six) hours as needed for nausea or vomiting. 12/18/20   Mesner, Barbara Cower, MD  saccharomyces boulardii (FLORASTOR) 250 MG capsule Take 250 mg by mouth 2 (two) times daily.    [provider]  Thiamine HCl (VITAMIN B-1 PO) Take  1 tablet by mouth daily.    [provider]      Allergies    Sulfa antibiotics, Canagliflozin, Clindamycin/lincomycin, Coenzyme q10-black pepper, Latex, Metformin and related, Ozempic (0.25 or 0.5 mg-dose) [semaglutide(0.25 or 0.5mg -dos)], Polyurethane [polyurethane], Tape, Cherry, Gabapentin, Other, and Sulfamethoxazole-trimethoprim    Review of Systems   Review of Systems  Constitutional:  Positive for appetite change. Negative for chills and fever.  Respiratory:  Negative for cough and shortness of breath.   Cardiovascular:  Negative for chest pain.  Gastrointestinal:  Positive for abdominal pain, nausea and vomiting.  Genitourinary:  Negative for dysuria.  Neurological:  Negative for weakness and headaches.    Physical Exam Updated Vital Signs BP (!) 243/93 (BP Location: Left Arm)   Pulse 76   Temp 98 F (36.7 C) (Oral)   Resp 18  Ht 5' (1.524 m)   Wt (!) 203.3 kg   SpO2 96%   BMI 87.53 kg/m  Physical Exam Vitals and nursing note reviewed.  Constitutional:      Appearance: She is obese.  HENT:     Mouth/Throat:     Mouth: Mucous membranes are dry.  Eyes:     Extraocular Movements: Extraocular movements intact.     Conjunctiva/sclera: Conjunctivae normal.     Pupils: Pupils are equal, round, and reactive to light.  Cardiovascular:     Rate and Rhythm: Normal rate and regular rhythm.  Pulmonary:     Effort: Pulmonary effort is normal.  Abdominal:     Palpations: Abdomen is soft.     Tenderness: There is abdominal tenderness.     Comments: Diffuse tenderness of the upper abdomen.  No guarding or rebound tenderness.  Musculoskeletal:     Comments: Patient has bilateral BKA's  Skin:    General: Skin is warm.  Neurological:     General: No focal deficit present.     Mental Status: She is alert.     Sensory: No sensory deficit.     Motor: No weakness.     ED Results / Procedures / Treatments   Labs (all labs ordered are listed, but only abnormal  results are displayed) Labs Reviewed  CBC WITH DIFFERENTIAL/PLATELET - Abnormal; Notable for the following components:      Result Value   RBC 3.71 (*)    Hemoglobin 11.0 (*)    HCT 33.4 (*)    All other components within normal limits  COMPREHENSIVE METABOLIC PANEL - Abnormal; Notable for the following components:   Potassium 2.9 (*)    Glucose, Bld 218 (*)    Creatinine, Ser 1.33 (*)    Calcium 8.1 (*)    Albumin 3.0 (*)    GFR, Estimated 47 (*)    All other components within normal limits  URINALYSIS, ROUTINE W REFLEX MICROSCOPIC - Abnormal; Notable for the following components:   Color, Urine STRAW (*)    Glucose, UA 50 (*)    Hgb urine dipstick SMALL (*)    Protein, ur 100 (*)    Bacteria, UA RARE (*)    All other components within normal limits  LIPASE, BLOOD    EKG EKG Interpretation Date/Time:  Wednesday July 23 2023 17:38:53 EST Ventricular Rate:  73 PR Interval:  178 QRS Duration:  118 QT Interval:  459 QTC Calculation: 506 R Axis:   -56  Text Interpretation: Sinus rhythm Left anterior fascicular block Confirmed by Vivi Barrack [phone]) on 07/23/2023 10:36:58 PM  Radiology CT ABDOMEN PELVIS W CONTRAST Result Date: 07/23/2023 CLINICAL DATA:  Abdominal pain.  Nausea vomiting and diarrhea. EXAM: CT ABDOMEN AND PELVIS WITH CONTRAST TECHNIQUE: Multidetector CT imaging of the abdomen and pelvis was performed using the standard protocol following bolus administration of intravenous contrast. RADIATION DOSE REDUCTION: This exam was performed according to the departmental dose-optimization program which includes automated exposure control, adjustment of the mA and/or kV according to patient size and/or use of iterative reconstruction technique. CONTRAST:  OMNIPAQUE IOHEXOL 300 MG/ML  SOLN COMPARISON:  CT abdomen pelvis dated 12/18/2020. FINDINGS: Evaluation of this exam is limited due to body habitus and respiratory motion. Lower chest: The visualized lung bases are  clear. No intra-abdominal free air or free fluid. Hepatobiliary: Fatty liver. Slight irregularity of the liver contour suggestive of early cirrhosis. Clinical correlation recommended. No biliary dilatation. The gallbladder is unremarkable. Pancreas: The pancreas  is unremarkable as visualized. Spleen: Normal in size without focal abnormality. Adrenals/Urinary Tract: The adrenal glands are unremarkable. There is no hydronephrosis on either side. There is symmetric enhancement and excretion of contrast by both kidneys. The visualized ureters and urinary bladder appear unremarkable. Stomach/Bowel: Mild distal colonic diverticulosis without active inflammatory changes. There is no bowel obstruction or active inflammation. The appendix is normal. Vascular/Lymphatic: Mild aortoiliac atherosclerotic disease. The IVC is unremarkable. No portal venous gas. There is no adenopathy. Reproductive: Hysterectomy.  No suspicious adnexal masses Other: None Musculoskeletal: No acute osseous pathology. IMPRESSION: 1. No acute intra-abdominal or pelvic pathology. 2. Mild distal colonic diverticulosis. No bowel obstruction. Normal appendix. 3. Fatty liver with possible early cirrhosis. 4.  Aortic Atherosclerosis (ICD10-I70.0). Electronically Signed   By: Elgie Collard M.D.   On: 07/23/2023 18:44    Procedures Procedures    Medications Ordered in ED Medications  sodium chloride 0.9 % bolus 1,000 mL (has no administration in time range)  ondansetron (ZOFRAN) injection 4 mg (has no administration in time range)    ED Course/ Medical Decision Making/ A&P                                 Medical Decision Making Patient here from [facility] for evaluation of nausea vomiting diarrhea and abdominal pain.  Symptoms present for 5 days.  No fever or chills.  Denies any black or bloody stools or hematemesis.  On my exam, patient having dry heaves and spitting into emesis bag.  Hypertensive on arrival.  Has not been able to  tolerate her antihypertensive medication in several days.  Suspect gastroenteritis, acute abdominal process also considered.  Hypertensive urgency  Amount and/or Complexity of Data Reviewed Labs: ordered.    Details: Labs interpreted by me, no evidence of leukocytosis, chemistries show hypokalemia with potassium of 2.9, serum creatinine 1.3 this is near baseline.  Lipase unremarkable, urinalysis without evidence of infection Radiology: ordered.    Details: CT abdomen and pelvis without acute intra-abdominal or pelvic finding ECG/medicine tests: ordered.    Details: EKG shows sinus rhythm with left anterior fascicular block Discussion of management or test interpretation with external provider(s): Patient with persistent nausea and vomiting here, was given IV fluids antiemetic, IV potassium as patient could not tolerate oral.  She has received 2 rounds of IV labetalol without improvement of her blood pressure.  Some improvement of her vomiting after receiving IV Ativan  Patient will likely need hospital admission for hypertensive urgency, intractable nausea vomiting  Discussed findings with [facility] hospitalist Dr. Lazarus Salines who agrees to admit  Risk Prescription drug management. Decision regarding hospitalization.           Final Clinical Impression(s) / ED Diagnoses Final diagnoses:  Hypertensive urgency  Nausea vomiting and diarrhea  Pain of upper abdomen    Rx / DC Orders ED Discharge Orders     None         Pauline Aus, PA-C 07/23/23 2352    Loetta Rough, MD 07/27/23 [phone]

## 2023-07-23 NOTE — ED Notes (Signed)
 Medication administration delayed due to patient being at CT

## 2023-07-23 NOTE — ED Notes (Signed)
 Per Pt, the staff at [facility] gave her 1 disintegrating tablet of Zofran  this morning, but w/o relief.

## 2023-07-24 DIAGNOSIS — R197 Diarrhea, unspecified: Secondary | ICD-10-CM | POA: Diagnosis not present

## 2023-07-24 DIAGNOSIS — R1010 Upper abdominal pain, unspecified: Secondary | ICD-10-CM

## 2023-07-24 DIAGNOSIS — K529 Noninfective gastroenteritis and colitis, unspecified: Secondary | ICD-10-CM

## 2023-07-24 DIAGNOSIS — E1151 Type 2 diabetes mellitus with diabetic peripheral angiopathy without gangrene: Secondary | ICD-10-CM | POA: Insufficient documentation

## 2023-07-24 DIAGNOSIS — K760 Fatty (change of) liver, not elsewhere classified: Secondary | ICD-10-CM

## 2023-07-24 DIAGNOSIS — E876 Hypokalemia: Secondary | ICD-10-CM

## 2023-07-24 DIAGNOSIS — R112 Nausea with vomiting, unspecified: Secondary | ICD-10-CM | POA: Diagnosis not present

## 2023-07-24 DIAGNOSIS — I160 Hypertensive urgency: Secondary | ICD-10-CM | POA: Diagnosis not present

## 2023-07-24 LAB — BASIC METABOLIC PANEL
Anion gap: 9 (ref 5–15)
BUN: 11 mg/dL (ref 6–20)
CO2: 26 mmol/L (ref 22–32)
Calcium: 7.5 mg/dL — ABNORMAL LOW (ref 8.9–10.3)
Chloride: 100 mmol/L (ref 98–111)
Creatinine, Ser: 1.18 mg/dL — ABNORMAL HIGH (ref 0.44–1.00)
GFR, Estimated: 54 mL/min — ABNORMAL LOW (ref >60)
Glucose, Bld: 218 mg/dL — ABNORMAL HIGH (ref 70–99)
Potassium: 3.3 mmol/L — ABNORMAL LOW (ref 3.5–5.1)
Sodium: 135 mmol/L (ref 135–145)

## 2023-07-24 LAB — CBC
HCT: 30 % — ABNORMAL LOW (ref 36.0–46.0)
Hemoglobin: 9.6 g/dL — ABNORMAL LOW (ref 12.0–15.0)
MCH: 29.6 pg (ref 26.0–34.0)
MCHC: 32 g/dL (ref 30.0–36.0)
MCV: 92.6 fL (ref 80.0–100.0)
Platelets: 262 10*3/uL (ref 150–400)
RBC: 3.24 MIL/uL — ABNORMAL LOW (ref 3.87–5.11)
RDW: 12.6 % (ref 11.5–15.5)
WBC: 6.5 10*3/uL (ref 4.0–10.5)
nRBC: 0 % (ref 0.0–0.2)

## 2023-07-24 LAB — CBG MONITORING, ED
Glucose-Capillary: 192 mg/dL — ABNORMAL HIGH (ref 70–99)
Glucose-Capillary: 167 mg/dL — ABNORMAL HIGH (ref 70–99)
Glucose-Capillary: 182 mg/dL — ABNORMAL HIGH (ref 70–99)

## 2023-07-24 LAB — MAGNESIUM: Magnesium: 1.5 mg/dL — ABNORMAL LOW (ref 1.7–2.4)

## 2023-07-24 LAB — PHOSPHORUS: Phosphorus: 3 mg/dL (ref 2.5–4.6)

## 2023-07-24 LAB — TSH: TSH: 1.985 u[IU]/mL (ref 0.350–4.500)

## 2023-07-24 LAB — GLUCOSE, CAPILLARY
Glucose-Capillary: 218 mg/dL — ABNORMAL HIGH (ref 70–99)
Glucose-Capillary: 200 mg/dL — ABNORMAL HIGH (ref 70–99)

## 2023-07-24 MED ORDER — BOOST / RESOURCE BREEZE PO LIQD CUSTOM
1.0000 | Freq: Three times a day (TID) | ORAL | Status: DC
  Administered 2023-07-24 – 2023-07-26 (×5): 1 via ORAL

## 2023-07-24 MED ORDER — LAMOTRIGINE 25 MG PO TABS
50.0000 mg | ORAL_TABLET | Freq: Two times a day (BID) | ORAL | Status: DC
  Administered 2023-07-24 – 2023-07-26 (×4): 50 mg via ORAL
  Filled 2023-07-24 (×4): qty 2

## 2023-07-24 MED ORDER — CLONIDINE HCL 0.1 MG/24HR TD PTWK: 0.1000 mg | MEDICATED_PATCH | TRANSDERMAL | Status: DC

## 2023-07-24 MED ORDER — PANTOPRAZOLE SODIUM 40 MG PO TBEC
40.0000 mg | DELAYED_RELEASE_TABLET | Freq: Every day | ORAL | Status: DC
  Administered 2023-07-24 – 2023-07-26 (×3): 40 mg via ORAL
  Filled 2023-07-24 (×3): qty 1

## 2023-07-24 MED ORDER — CLONIDINE HCL 0.1 MG PO TABS
0.1000 mg | ORAL_TABLET | Freq: Every day | ORAL | Status: DC
  Administered 2023-07-24 – 2023-07-25 (×2): 0.1 mg via ORAL
  Filled 2023-07-24 (×2): qty 1

## 2023-07-24 MED ORDER — CLONIDINE HCL 0.1 MG PO TABS: 0.1000 mg | ORAL_TABLET | Freq: Every day | ORAL | Status: DC

## 2023-07-24 MED ORDER — ATORVASTATIN CALCIUM 40 MG PO TABS
80.0000 mg | ORAL_TABLET | Freq: Every day | ORAL | Status: DC
  Administered 2023-07-24 – 2023-07-26 (×3): 80 mg via ORAL
  Filled 2023-07-24 (×3): qty 2

## 2023-07-24 MED ORDER — INSULIN ASPART 100 UNIT/ML IJ SOLN
0.0000 [IU] | Freq: Three times a day (TID) | INTRAMUSCULAR | Status: DC
Start: 2023-07-24 — End: 2023-07-26
  Administered 2023-07-24: 1 [IU] via SUBCUTANEOUS
  Administered 2023-07-24: 2 [IU] via SUBCUTANEOUS
  Administered 2023-07-24 – 2023-07-25 (×4): 1 [IU] via SUBCUTANEOUS
  Administered 2023-07-26 (×2): 3 [IU] via SUBCUTANEOUS
  Filled 2023-07-24 (×2): qty 1

## 2023-07-24 MED ORDER — BUPROPION HCL ER (SR) 150 MG PO TB12
150.0000 mg | ORAL_TABLET | Freq: Every morning | ORAL | Status: DC
  Administered 2023-07-25 – 2023-07-26 (×2): 150 mg via ORAL
  Filled 2023-07-24 (×2): qty 1

## 2023-07-24 MED ORDER — ONDANSETRON HCL 4 MG/2ML IJ SOLN
4.0000 mg | Freq: Three times a day (TID) | INTRAMUSCULAR | Status: DC | PRN
  Administered 2023-07-24 – 2023-07-25 (×2): 4 mg via INTRAVENOUS
  Filled 2023-07-24 (×2): qty 2

## 2023-07-24 MED ORDER — CARVEDILOL 12.5 MG PO TABS
12.5000 mg | ORAL_TABLET | Freq: Two times a day (BID) | ORAL | Status: DC
  Administered 2023-07-24 – 2023-07-26 (×6): 12.5 mg via ORAL
  Filled 2023-07-24 (×6): qty 1

## 2023-07-24 MED ORDER — INSULIN ASPART 100 UNIT/ML IJ SOLN: 6.0000 [IU] | Freq: Three times a day (TID) | INTRAMUSCULAR | Status: DC

## 2023-07-24 MED ORDER — INSULIN ASPART 100 UNIT/ML IJ SOLN: 0.0000 [IU] | Freq: Three times a day (TID) | INTRAMUSCULAR | Status: DC

## 2023-07-24 MED ORDER — PNEUMOCOCCAL 20-VAL CONJ VACC 0.5 ML IM SUSY
0.5000 mL | PREFILLED_SYRINGE | INTRAMUSCULAR | Status: AC
Start: 2023-07-25 — End: 2023-07-25
  Administered 2023-07-25: 0.5 mL via INTRAMUSCULAR
  Filled 2023-07-24: qty 0.5

## 2023-07-24 MED ORDER — INSULIN GLARGINE-YFGN 100 UNIT/ML ~~LOC~~ SOLN
12.0000 [IU] | Freq: Every day | SUBCUTANEOUS | Status: DC
  Administered 2023-07-24: 12 [IU] via SUBCUTANEOUS
  Filled 2023-07-24: qty 0.12

## 2023-07-24 MED ORDER — MAGNESIUM SULFATE 2 GM/50ML IV SOLN
2.0000 g | Freq: Once | INTRAVENOUS | Status: AC
  Administered 2023-07-24: 2 g via INTRAVENOUS
  Filled 2023-07-24: qty 50

## 2023-07-24 MED ORDER — METOCLOPRAMIDE HCL 10 MG PO TABS: 10.0000 mg | ORAL_TABLET | Freq: Four times a day (QID) | ORAL | Status: DC | PRN

## 2023-07-24 MED ORDER — LISINOPRIL 5 MG PO TABS
5.0000 mg | ORAL_TABLET | Freq: Every day | ORAL | Status: DC
  Administered 2023-07-24 – 2023-07-26 (×3): 5 mg via ORAL
  Filled 2023-07-24 (×3): qty 1

## 2023-07-24 MED ORDER — POTASSIUM CHLORIDE CRYS ER 20 MEQ PO TBCR
40.0000 meq | EXTENDED_RELEASE_TABLET | Freq: Two times a day (BID) | ORAL | Status: AC
  Administered 2023-07-24 (×2): 40 meq via ORAL
  Filled 2023-07-24 (×2): qty 2

## 2023-07-24 MED ORDER — FLUOXETINE HCL 40 MG PO CAPS: 40.0000 mg | ORAL_CAPSULE | Freq: Every day | ORAL | Status: DC

## 2023-07-24 MED ORDER — INSULIN ASPART 100 UNIT/ML IJ SOLN
0.0000 [IU] | Freq: Every day | INTRAMUSCULAR | Status: DC
  Administered 2023-07-25: 2 [IU] via SUBCUTANEOUS

## 2023-07-24 MED ORDER — INSULIN GLARGINE-YFGN 100 UNIT/ML ~~LOC~~ SOLN
6.0000 [IU] | Freq: Two times a day (BID) | SUBCUTANEOUS | Status: DC
  Administered 2023-07-24 – 2023-07-25 (×4): 6 [IU] via SUBCUTANEOUS
  Filled 2023-07-24 (×5): qty 0.06

## 2023-07-24 MED ORDER — LACTATED RINGERS IV SOLN: INTRAVENOUS | Status: AC

## 2023-07-24 MED ORDER — INSULIN ASPART 100 UNIT/ML IJ SOLN
3.0000 [IU] | Freq: Three times a day (TID) | INTRAMUSCULAR | Status: DC
  Administered 2023-07-24 – 2023-07-26 (×6): 3 [IU] via SUBCUTANEOUS
  Filled 2023-07-24: qty 1

## 2023-07-24 MED ORDER — LEVOTHYROXINE SODIUM 75 MCG PO TABS
150.0000 ug | ORAL_TABLET | Freq: Every day | ORAL | Status: DC
  Administered 2023-07-24 – 2023-07-26 (×3): 150 ug via ORAL
  Filled 2023-07-24 (×2): qty 2
  Filled 2023-07-24: qty 3

## 2023-07-24 NOTE — Plan of Care (Signed)
  Problem: Education: Goal: Ability to describe self-care measures that may prevent or decrease complications (Diabetes Survival Skills Education) will improve Outcome: Progressing Goal: Individualized Educational Video(s) Outcome: Progressing   

## 2023-07-24 NOTE — H&P (Addendum)
History and Physical    Michelle Skinner OAC:[id number] DOB: 1966-04-24 DOA: 07/23/2023  PCP: Pcp, No   Patient coming from: [facility]   Chief Complaint:  Chief Complaint  Patient presents with   Abdominal Pain    HPI:  Michelle Skinner is a 58 y.o. female with hx of diabetes complicated by gastroparesis, osteomyelitis status post bilateral BKA, CKD 3, hypothyroidism, hyperlipidemia, morbid obesity, who presents to the hospital from [facility] due to nausea, vomiting, diarrhea.  Reports symptom onset 5 days ago.  Unable to tolerate significant p.o.'s.  Regurgitating food.  Tolerating minimal water and Gatorade.  Medications have gone down but then end up coming up with her emesis.  She had had few episodes of watery brown stool per day but this stopped yesterday.  Having lower abdominal pain. otherwise denies recent illness.   Review of Systems:  ROS complete and negative except as marked above   Allergies  Allergen Reactions   Sulfa Antibiotics Diarrhea, Nausea And Vomiting and Rash   Canagliflozin Diarrhea, Nausea And Vomiting and Other (See Comments)    Unknown   Clindamycin/Lincomycin Diarrhea and Nausea And Vomiting   Coenzyme Q10-Black Pepper    Latex     Rash, itching, over a long period of time   Metformin And Related Diarrhea and Nausea And Vomiting   Ozempic (0.25 Or 0.5 Mg-Dose) [Semaglutide(0.25 Or 0.5mg -Dos)] Diarrhea and Nausea And Vomiting   Polyurethane [Polyurethane]     Blistering and swelling   Tape Itching and Swelling    Reaction after a few days use/ please use paper tape   Cherry Itching, Rash, Swelling and Other (See Comments)    Throat swelling  Unknown    Throat swelling   Gabapentin Diarrhea, Nausea And Vomiting and Rash    Unknown   Other Itching, Swelling and Rash    Reaction to Hot peppers (throat swelling)   Sulfamethoxazole-Trimethoprim Diarrhea, Nausea And Vomiting, Rash and Itching    Prior to Admission medications    Medication Sig Start Date End Date Taking? Authorizing Provider  LOKELMA 10 g PACK packet Take 1 packet by mouth daily. 07/10/23  Yes [provider]  acetaminophen (TYLENOL) 325 MG tablet Take 650 mg by mouth every 4 (four) hours as needed for mild pain (pain score 1-3).    [provider]  Ascorbic Acid (VITAMIN C PO) Take 1 tablet by mouth at bedtime.    [provider]  atorvastatin (LIPITOR) 80 MG tablet Take 80 mg by mouth daily. 04/11/23   [provider]  buPROPion ER (WELLBUTRIN SR) 100 MG 12 hr tablet Take 100 mg by mouth every morning.    [provider]  Calcium Carbonate (CALCIUM 500 PO) Take 1 tablet by mouth daily.    [provider]  carvedilol (COREG) 12.5 MG tablet Take 1 tablet (12.5 mg total) by mouth 2 (two) times daily. 09/17/20 05/13/23  [name], MD  Cholecalciferol (VITAMIN D3) 125 MCG (5000 UT) CAPS Take 1 capsule (5,000 Units total) by mouth daily. 05/13/18   [name], MD  cloNIDine (CATAPRES) 0.1 MG tablet Take 0.1 mg by mouth at bedtime.    [provider]  Continuous Glucose Receiver (FREESTYLE LIBRE 2 READER) DEVI As directed 04/29/23   [name], MD  Continuous Glucose Sensor (FREESTYLE LIBRE 2 SENSOR) MISC 1 Piece by Does not apply route every 14 (fourteen) days. 12/25/22   [name], MD  diphenhydrAMINE (BENADRYL) 25 mg  capsule Take 25 mg by mouth daily as needed (allergic reactions).    [provider]  docusate sodium (COLACE) 100 MG capsule Take 100 mg by mouth every 8 (eight) hours as needed for mild constipation.    [provider]  Ferrous Sulfate (IRON) 325 (65 Fe) MG TABS Take 1 tablet by mouth at bedtime.    [provider]  FLUoxetine (PROZAC) 40 MG capsule Take 40 mg by mouth at bedtime.    [provider]  folic acid (FOLVITE) 1 MG tablet Take 1 tablet (1 mg total) by mouth daily. 09/17/20   [name], MD   hydroxypropyl methylcellulose / hypromellose (ISOPTO TEARS / GONIOVISC) 2.5 % ophthalmic solution Place 1-2 drops into both eyes every 8 (eight) hours as needed for dry eyes (diabetic retinopathy). 1 drop for dry eyes and 2 drops for diabetic retinopathy    [provider]  lamoTRIgine (LAMICTAL) 25 MG tablet Take 25 mg by mouth 2 (two) times daily.    [provider]  LANTUS SOLOSTAR 100 UNIT/ML Solostar Pen Inject into the skin. 02/20/23   [provider]  levothyroxine (SYNTHROID) 150 MCG tablet Take 1 tablet (150 mcg total) by mouth daily before breakfast. 07/12/20   [name], [name], MD  loperamide (IMODIUM) 2 MG capsule Take 2 mg by mouth as needed for diarrhea or loose stools. 04/20/23   [provider]  magnesium citrate SOLN Take 296 mLs (1 Bottle total) by mouth daily as needed for severe constipation. Patient taking differently: Take 1 Bottle by mouth every 8 (eight) hours as needed (laxative). 09/17/20   [name], MD  metoCLOPramide (REGLAN) 10 MG tablet Take 1 tablet (10 mg total) by mouth every 6 (six) hours as needed for nausea or vomiting. 06/06/23   [name], PA-C  naloxone [facility]) nasal spray 4 mg/0.1 mL Place 1 spray into the nose once.    [provider]  NOVOLOG FLEXPEN 100 UNIT/ML FlexPen Inject 1-14 Units into the skin 6 (six) times daily. Inject 14 units three times a day 10 minutes before meals, then 0-8 units with meals depending on blood sugar reading. 151-200 = 1 201-250 = 2 251-300 = 4 301-350 = 6 351-400 = 8 05/08/23   [provider]  nystatin (MYCOSTATIN/NYSTOP) powder Apply 1 Application topically 3 (three) times daily. Under breasts for redness 03/13/23   [provider]  omega-3 acid ethyl esters (LOVAZA) 1 g capsule Take 1 capsule by mouth daily.    [provider]  ondansetron (ZOFRAN) 4 MG tablet Take 4 mg by mouth every 8 (eight) hours as needed for nausea or vomiting.     [provider]  pantoprazole (PROTONIX) 40 MG tablet TAKE ONE (1) TABLET EACH DAY Patient taking differently: Take 40 mg by mouth daily. 12/01/18   [name], MD  Polyethyl Glycol-Propyl Glycol (SYSTANE) 0.4-0.3 % SOLN Place 1 Application into both eyes every 12 (twelve) hours as needed (eye discomfort).    [provider]  polyethylene glycol (MIRALAX / GLYCOLAX) 17 g packet Take 17 g by mouth 2 (two) times daily as needed for mild constipation. Patient taking differently: Take 17 g by mouth daily as needed for mild constipation. 09/17/20   [name], MD  promethazine (PHENERGAN) 25 MG suppository Place 1 suppository (25 mg total) rectally every 6 (six) hours as needed for nausea or vomiting. 12/18/20   Mesner, Barbara Cower, MD  saccharomyces boulardii (FLORASTOR) 250 MG capsule Take 250  mg by mouth 2 (two) times daily.    [provider]  SPS, SODIUM POLYSTYRENE SULF, 15 GM/60ML suspension SMARTSIG:60 Milliliter(s) By Mouth 3 Times Daily 05/21/23   [provider]  Thiamine HCl (VITAMIN B-1 PO) Take 1 tablet by mouth daily.    [provider]    Past Medical History:  Diagnosis Date   Amputee, above knee, left (HCC)    Amputee, above knee, right (HCC)    Anemia    Cervical cancer (HCC)    Depression    Diabetes mellitus, type II (HCC)    GERD (gastroesophageal reflux disease)    Hyperlipidemia    Neuropathy    PTSD (post-traumatic stress disorder)     Past Surgical History:  Procedure Laterality Date   ABDOMINAL HYSTERECTOMY     AMPUTATION Bilateral 09/13/2020   Procedure: BILATERAL BELOW KNEE AMPUTATIONS;  Surgeon: Nadara Mustard, MD;  Location: [facility] OR;  Service: Orthopedics;  Laterality: Bilateral;   COLONOSCOPY WITH PROPOFOL N/A 05/25/2018   Procedure: COLONOSCOPY WITH PROPOFOL;  Surgeon: Corbin Ade, MD;  Location: [facility] END[facility] SUITE;  Service: Endoscopy;  Laterality: N/A;  7:30am   I & D EXTREMITY Bilateral 09/10/2020   Procedure:  IRRIGATION AND DEBRIDEMENT FEET;;  Surgeon: Nadara Mustard, MD;  Location: [facility] OR;  Service: Orthopedics;  Laterality: Bilateral;   IRRIGATION AND DEBRIDEMENT FOOT Bilateral 09/08/2020   Procedure: IRRIGATION AND DEBRIDEMENT FOOT;  Surgeon: Lucretia Roers, MD;  Location: [facility] ORS;  Service: General;  Laterality: Bilateral;  left foot 11 x 10.5 x 1    OTHER SURGICAL HISTORY Right    R Foot- I&D   POLYPECTOMY  05/25/2018   Procedure: POLYPECTOMY;  Surgeon: Corbin Ade, MD;  Location: [facility];  Service: Endoscopy;;  colon   UMBILICAL HERNIA REPAIR  2007     reports that she has never smoked. She has never used smokeless tobacco. She reports that she does not drink alcohol and does not use drugs.  Family History  Problem Relation Age of Onset   Hypertension Mother    Diabetes Father    Hyperlipidemia Father    CAD Father    Stroke Father    Osteoporosis Maternal Grandmother    Cancer Maternal Grandmother    Hypertension Maternal Grandmother    Colon cancer Neg Hx      Physical Exam: Vitals:   07/24/23 0353 07/24/23 0400 07/24/23 0500 07/24/23 0608  BP: (!) 185/76 (!) 184/83 (!) 167/79   Pulse: 78 75 70   Resp:  11 19   Temp:    97.9 F (36.6 C)  TempSrc:    Oral  SpO2:  99% 96%   Weight:      Height:        Gen: Awake, alert, NAD   CV: Regular, normal S1, S2, no murmurs  Resp: Normal WOB, CTAB  Abd: Obese, normoactive, nontender MSK: Symmetric, no edema  Skin: No rashes or lesions to exposed skin  Neuro: Alert and interactive  Psych: euthymic, appropriate    Data review:   Labs reviewed, notable for:   K2.9, creatinine 1.33 baseline 1.2 LFT, lipase normal  Micro:  Results for orders placed or performed during the hospital encounter of 12/17/20  Urine culture     Status: Abnormal   Collection Time: 12/18/20  6:38 AM   Specimen: Urine, Clean Catch  Result Value Ref Range Status   Specimen Description   Final    URINE, CLEAN CATCH Performed at  [facility], [address] [place], [place] [address]    Special Requests   Final    NONE Performed at [facility], [address], [place], [place] [address]    Culture >=100,000 COLONIES/mL ESCHERICHIA COLI (A)  Final   Report Status 12/21/2020 FINAL  Final   Organism ID, Bacteria ESCHERICHIA COLI (A)  Final      Susceptibility   Escherichia coli - MIC*    AMPICILLIN <=2 SENSITIVE Sensitive     CEFAZOLIN <=4 SENSITIVE Sensitive     CEFEPIME <=0.12 SENSITIVE Sensitive     CEFTRIAXONE <=0.25 SENSITIVE Sensitive     CIPROFLOXACIN <=0.25 SENSITIVE Sensitive     GENTAMICIN <=1 SENSITIVE Sensitive     IMIPENEM <=0.25 SENSITIVE Sensitive     NITROFURANTOIN <=16 SENSITIVE Sensitive     TRIMETH/SULFA <=20 SENSITIVE Sensitive     AMPICILLIN/SULBACTAM <=2 SENSITIVE Sensitive     PIP/TAZO <=4 SENSITIVE Sensitive     * >=100,000 COLONIES/mL ESCHERICHIA COLI    Imaging reviewed:  CT ABDOMEN PELVIS W CONTRAST Result Date: 07/23/2023 CLINICAL DATA:  Abdominal pain.  Nausea vomiting and diarrhea. EXAM: CT ABDOMEN AND PELVIS WITH CONTRAST TECHNIQUE: Multidetector CT imaging of the abdomen and pelvis was performed using the standard protocol following bolus administration of intravenous contrast. RADIATION DOSE REDUCTION: This exam was performed according to the departmental dose-optimization program which includes automated exposure control, adjustment of the mA and/or kV according to patient size and/or use of iterative reconstruction technique. CONTRAST:  OMNIPAQUE IOHEXOL 300 MG/ML  SOLN COMPARISON:  CT abdomen pelvis dated 12/18/2020. FINDINGS: Evaluation of this exam is limited due to body habitus and respiratory motion. Lower chest: The visualized lung bases are clear. No intra-abdominal free air or free fluid. Hepatobiliary: Fatty liver. Slight irregularity of the liver contour suggestive of early cirrhosis. Clinical correlation recommended. No biliary dilatation. The gallbladder is unremarkable.  Pancreas: The pancreas is unremarkable as visualized. Spleen: Normal in size without focal abnormality. Adrenals/Urinary Tract: The adrenal glands are unremarkable. There is no hydronephrosis on either side. There is symmetric enhancement and excretion of contrast by both kidneys. The visualized ureters and urinary bladder appear unremarkable. Stomach/Bowel: Mild distal colonic diverticulosis without active inflammatory changes. There is no bowel obstruction or active inflammation. The appendix is normal. Vascular/Lymphatic: Mild aortoiliac atherosclerotic disease. The IVC is unremarkable. No portal venous gas. There is no adenopathy. Reproductive: Hysterectomy.  No suspicious adnexal masses Other: None Musculoskeletal: No acute osseous pathology. IMPRESSION: 1. No acute intra-abdominal or pelvic pathology. 2. Mild distal colonic diverticulosis. No bowel obstruction. Normal appendix. 3. Fatty liver with possible early cirrhosis. 4.  Aortic Atherosclerosis (ICD10-I70.0). Electronically Signed   By: Elgie Collard M.D.   On: 07/23/2023 18:44    EKG: EKG personally reviewed QTc 506  ED Course:  Treated with labetalol 10 mg IV x 2, Ativan, Zofran, KCl 20 mEq IV, 1 L IV fluid   Assessment/Plan:  58 y.o. female with hx diabetes complicated by gastroparesis, osteomyelitis status post bilateral BKA, CKD 3, hypothyroidism, hyperlipidemia, morbid obesity, who presents to the hospital from [facility] due to nausea, vomiting, diarrhea.  Likely viral gastroenteritis and complicated by hypertensive urgency due to intolerance of p.o. antihypertensive medications.  Gastroenteritis, suspected viral Hypokalemia, asymptomatic, improving Possibly worsening gastroparesis symptoms related to above CT abdomen pelvis with no acute findings to explain her abdominal symptoms. -Symptomatic management Ativan 0.5 mg IV every 4 hours as needed first-line for nausea and vomiting (prolonged QTc) -Maintenance IV fluid  LR  at 50 cc/h,  -Clear liquid diet, advance as tolerated -Additional 40 mEq IV potassium, repeat in a.m. -Hold off on Reglan, Benadryl due to prolonged QTC.  Can repeat EKG once K normalized and resume these medications for gastroparesis symptoms -If diarrhea returns can send for stool studies  Hypertensive urgency, likely from intolerance of p.o. antihypertensives BP initially 243/93.  Asymptomatic without acute endorgan findings.  -Switch her home clonidine to patch 0.1 mg per 24-hour -Resume home carvedilol 12.5 mg twice daily as tolerated -If she remains hypertensive consider amlodipine, or thiazide but would need potassium supplementation  Incidental findings on imaging:  Fatty liver with possible early development of cirrhosis: Weight loss, outpatient follow-up  Chronic medical problems: Medication management: Pharmacy med rec not completed at time of admission.  Medication rec completed based on [facility] summary from facility which was personally reviewed. Diabetes type II: Insulin regimen at facility is Semglee 17 units at bedtime and aspart 14 units with meal and sliding scale.  Inpatient slight reduction to Semglee 10 units at bedtime, aspart 10 units with meals and SSI for very sensitive considering decreased p.o. intake with symptoms above. CKD 3: Creatinine near baseline around 1.2 Hypothyroidism: Continue home levothyroxine Hyperlipidemia: Continue home atorvastatin Morbid obesity: Outpatient follow-up and attention to weight loss Mood disorder: Continue home bupropion, lamotrigine GERD: Home PPI  Body mass index is 87.53 kg/m. Morbid obesity affecting medical care per above    DVT prophylaxis:  Lovenox Code Status:  Full Code Diet:  Diet Orders (From admission, onward)    None      Family Communication:  No   Consults:  None   Admission status:   Inpatient, Telemetry bed  Severity of Illness: The appropriate patient status for this patient is INPATIENT. Inpatient  status is judged to be reasonable and necessary in order to provide the required intensity of service to ensure the patient's safety. The patient's presenting symptoms, physical exam findings, and initial radiographic and laboratory data in the context of their chronic comorbidities is felt to place them at high risk for further clinical deterioration. Furthermore, it is not anticipated that the patient will be medically stable for discharge from the hospital within 2 midnights of admission.   * I certify that at the point of admission it is my clinical judgment that the patient will require inpatient hospital care spanning beyond 2 midnights from the point of admission due to high intensity of service, high risk for further deterioration and high frequency of surveillance required.*   Dolly Rias, MD [facility]  How to contact the [facility] Attending or Consulting provider 7A - 7P or covering provider during after hours 7P -7A, for this patient.  Check the care team in [facility] and look for a) attending/consulting [facility] provider listed and b) the [facility] team listed Log into [email or web address] and use [place]'s universal password to access. If you do not have the password, please contact the hospital operator. Locate the [facility] provider you are looking for under [facility] and page to a number that you can be directly reached. If you still have difficulty reaching the provider, please page the [facility] (Director on Call) for the Hospitalists listed on amion for assistance.  07/24/2023, 6:48 AM

## 2023-07-24 NOTE — ED Notes (Signed)
Pt given cup of water upon request; suction canister of urine emptied and lights cut down for pt comfort

## 2023-07-24 NOTE — Progress Notes (Signed)
[facility] PROGRESS NOTE    Progress Note  Michelle Skinner  ZOX:[id number] DOB: April 09, 1966 DOA: 07/23/2023 PCP: Oneita Hurt, No     Brief Narrative:   Michelle Skinner is an 58 y.o. female past medical history diabetes mellitus with gastroparesis, osteomyelitis status post bilateral BKA, chronic kidney disease stage III, hypothyroidism morbid obesity comes in from skilled nursing facility for nausea vomiting and diarrhea that started 5 days prior to admission, she relates she has been having watery stools which is now resolved.  Is having lower abdominal pain.    Assessment/Plan:   Probable viral gastroenteritis in the setting of gastroparesis: CT scan of the abdomen pelvis showed no acute findings.,  Did show fatty liver. She was started on Zofran and Ativan for intractable nausea. She relates her nausea is better will allow clear liquid diet.  Monitor and replete electrolytes as needed.  Essential hypertension/hypertensive urgency: Likely from intolerable orals. He was changed to a clonidine patch resume on her Coreg. And her blood pressure is improved. Still elevated, will start her on an ACE inhibitor  Incidental fatty liver on imaging: Noted.   DM (diabetes mellitus), type 2 with peripheral vascular complications (HCC) Resume her long-acting insulin, continue sliding scale  Hypothyroidism Continue Synthroid.  BMI 50.0-59.9, adult [facility]) Noted, she has been counseled.  Hypokalemia Replete orally recheck in the morning.  Hypomagnesemia: Replete IV recheck in the morning.  Prolonged QTc: Try potassium greater than 4 magnesium greater than 2.  Pseudohyperkalemia: Corrected for albumin is unremarkable.  Normocytic anemia: Hemoglobin close to baseline monitor closely.   DVT prophylaxis: lovenox Family Communication:none Status is: Inpatient Remains inpatient appropriate because: Viral gastroenteritis with electrolyte imbalance.    Code Status:     Code  Status Orders  (From admission, onward)           Start     Ordered   07/23/23 2340  Full code  Continuous       Question:  By:  Answer:  Other   07/23/23 2341           Code Status History     Date Active Date Inactive Code Status Order ID Comments User Context   05/13/2023 1657 05/17/2023 1927 Full Code [id number]  Lilyan Gilford, DO ED   09/06/2020 1739 09/17/2020 1959 Full Code [id number]  Jacques Navy, MD ED   04/27/2020 1141 05/04/2020 1948 Full Code [id number]  Chevis Pretty, MD ED      Advance Directive Documentation    Flowsheet Row Most Recent Value  Type of Advance Directive Out of facility DNR (pink MOST or yellow form)  Pre-existing out of facility DNR order (yellow form or pink MOST form) --  "MOST" Form in Place? --         IV Access:   Peripheral IV   Procedures and diagnostic studies:   CT ABDOMEN PELVIS W CONTRAST Result Date: 07/23/2023 CLINICAL DATA:  Abdominal pain.  Nausea vomiting and diarrhea. EXAM: CT ABDOMEN AND PELVIS WITH CONTRAST TECHNIQUE: Multidetector CT imaging of the abdomen and pelvis was performed using the standard protocol following bolus administration of intravenous contrast. RADIATION DOSE REDUCTION: This exam was performed according to the departmental dose-optimization program which includes automated exposure control, adjustment of the mA and/or kV according to patient size and/or use of iterative reconstruction technique. CONTRAST:  OMNIPAQUE IOHEXOL 300 MG/ML  SOLN COMPARISON:  CT abdomen pelvis dated 12/18/2020. FINDINGS: Evaluation of this exam is limited due to body habitus and respiratory  motion. Lower chest: The visualized lung bases are clear. No intra-abdominal free air or free fluid. Hepatobiliary: Fatty liver. Slight irregularity of the liver contour suggestive of early cirrhosis. Clinical correlation recommended. No biliary dilatation. The gallbladder is unremarkable. Pancreas: The pancreas is  unremarkable as visualized. Spleen: Normal in size without focal abnormality. Adrenals/Urinary Tract: The adrenal glands are unremarkable. There is no hydronephrosis on either side. There is symmetric enhancement and excretion of contrast by both kidneys. The visualized ureters and urinary bladder appear unremarkable. Stomach/Bowel: Mild distal colonic diverticulosis without active inflammatory changes. There is no bowel obstruction or active inflammation. The appendix is normal. Vascular/Lymphatic: Mild aortoiliac atherosclerotic disease. The IVC is unremarkable. No portal venous gas. There is no adenopathy. Reproductive: Hysterectomy.  No suspicious adnexal masses Other: None Musculoskeletal: No acute osseous pathology. IMPRESSION: 1. No acute intra-abdominal or pelvic pathology. 2. Mild distal colonic diverticulosis. No bowel obstruction. Normal appendix. 3. Fatty liver with possible early cirrhosis. 4.  Aortic Atherosclerosis (ICD10-I70.0). Electronically Signed   By: Elgie Collard M.D.   On: 07/23/2023 18:44     Medical Consultants:   None.   Subjective:    Michelle Skinner she relates her nausea is better would like to try to eat something like around noon when she is more awake.  Objective:    Vitals:   07/24/23 0400 07/24/23 0500 07/24/23 0608 07/24/23 0700  BP: (!) 184/83 (!) 167/79  (!) 153/94  Pulse: 75 70  67  Resp: 11 19  18   Temp:   97.9 F (36.6 C)   TempSrc:   Oral   SpO2: 99% 96%  97%  Weight:      Height:       SpO2: 97 %   Intake/Output Summary (Last 24 hours) at 07/24/2023 1610 Last data filed at 07/24/2023 0024 Gross per 24 hour  Intake --  Output 2000 ml  Net -2000 ml   Filed Weights   07/23/23 1620  Weight: (!) 203.3 kg    Exam: General exam: In no acute distress. Respiratory system: Good air movement and clear to auscultation. Cardiovascular system: S1 & S2 heard, RRR. No JVD. Gastrointestinal system: Abdomen is nondistended, soft and nontender.   Extremities: No pedal edema. Skin: No rashes, lesions or ulcers Psychiatry: Judgement and insight appear normal. Mood & affect appropriate.    Data Reviewed:    Labs: Basic Metabolic Panel: Recent Labs  Lab 07/23/23 1700 07/24/23 0436  NA 138 135  K 2.9* 3.3*  CL 98 100  CO2 31 26  GLUCOSE 218* 218*  BUN 13 11  CREATININE 1.33* 1.18*  CALCIUM 8.1* 7.5*  MG  --  1.5*  PHOS  --  3.0   GFR Estimated Creatinine Clearance: 91.3 mL/min (A) (by C-G formula based on SCr of 1.18 mg/dL (H)). Liver Function Tests: Recent Labs  Lab 07/23/23 1700  AST 16  ALT 14  ALKPHOS 95  BILITOT 0.9  PROT 6.8  ALBUMIN 3.0*   Recent Labs  Lab 07/23/23 1700  LIPASE 23   No results for input(s): "AMMONIA" in the last 168 hours. Coagulation profile No results for input(s): "INR", "PROTIME" in the last 168 hours. COVID-19 Labs  No results for input(s): "DDIMER", "FERRITIN", "LDH", "CRP" in the last 72 hours.  Lab Results  Component Value Date   SARSCOV2NAA NEGATIVE 09/16/2020   SARSCOV2NAA NEGATIVE 09/06/2020   SARSCOV2NAA NEGATIVE 05/02/2020   SARSCOV2NAA NEGATIVE 04/27/2020    CBC: Recent Labs  Lab 07/23/23 1700 07/24/23 0436  WBC 7.5 6.5  NEUTROABS 5.9  --   HGB 11.0* 9.6*  HCT 33.4* 30.0*  MCV 90.0 92.6  PLT 268 262   Cardiac Enzymes: No results for input(s): "CKTOTAL", "CKMB", "CKMBINDEX", "TROPONINI" in the last 168 hours. BNP (last 3 results) No results for input(s): "PROBNP" in the last 8760 hours. CBG: Recent Labs  Lab 07/24/23 0349  GLUCAP 192*   D-Dimer: No results for input(s): "DDIMER" in the last 72 hours. Hgb A1c: No results for input(s): "HGBA1C" in the last 72 hours. Lipid Profile: No results for input(s): "CHOL", "HDL", "LDLCALC", "TRIG", "CHOLHDL", "LDLDIRECT" in the last 72 hours. Thyroid function studies: Recent Labs    07/24/23 0436  TSH 1.985   Anemia work up: No results for input(s): "VITAMINB12", "FOLATE", "FERRITIN", "TIBC",  "IRON", "RETICCTPCT" in the last 72 hours. Sepsis Labs: Recent Labs  Lab 07/23/23 1700 07/24/23 0436  WBC 7.5 6.5   Microbiology No results found for this or any previous visit (from the past 240 hours).   Medications:    atorvastatin  80 mg Oral Daily   buPROPion ER  150 mg Oral q morning   carvedilol  12.5 mg Oral BID   cloNIDine  0.1 mg Transdermal Weekly   enoxaparin (LOVENOX) injection  80 mg Subcutaneous Q24H   insulin aspart  0-6 Units Subcutaneous TID WC   insulin aspart  6 Units Subcutaneous TID WC   insulin glargine-yfgn  12 Units Subcutaneous QHS   lamoTRIgine  50 mg Oral BID   levothyroxine  150 mcg Oral Q0600   pantoprazole  40 mg Oral Daily   sodium chloride flush  3 mL Intravenous Q12H   Continuous Infusions:  lactated ringers Stopped (07/24/23 0646)   lactated ringers 75 mL/hr at 07/24/23 0648      LOS: 1 day   Marinda Elk  [facility]  07/24/2023, 7:22 AM

## 2023-07-24 NOTE — TOC Transition Note (Addendum)
Transition of Care [facility]) - Discharge Note   Patient Details  Name: Michelle Skinner MRN: [id number] Date of Birth: 1965/12/03  Transition of [facility]) CM/SW Contact:  Beather Arbour Phone Number: 07/24/2023, 12:42 PM   Clinical Narrative:     Patient is risk for readmission. Patient was admitted for Hypertensive urgency. CSW spoke with patient at bedside. Patient is a long-[facility] resident at [facility]. CV shared with patient that if she did not do a bed hold her bed would be given up and would have to wait till one opens to return. Patient was upset . CSW went to see pt at bedside and called Debbie to see if something could be worked out with patient present. Patient stated that she had the money , but before giving the card information pt chest started hurting . CSW informed nurse . Pt did not provide card number due to her pain level and stated that if she needed anything she will let CSW know. TOC will continue to follow.   CSW spoke with Loni Beckwith shared that pt did not call to do bed hold but that she should have an open bed early next week. Debbie asked for pt to give her a call so she can talk to her. CSW shared this information with patient.   Final next level of care: Skilled Nursing Facility [facility] , if bed is open) Barriers to Discharge: Continued Medical Work up   Patient Goals and CMS Choice Patient states their goals for this hospitalization and ongoing recovery are:: return back to SNF CMS Medicare.gov Compare Post Acute Care list provided to:: Patient Choice offered to / list presented to : Patient [place] ownership interest in [facility].provided to:: Patient    Discharge Placement     Skilled Nursing Nursing Facility   Discharge Plan and Services Additional resources added to the After Visit Summary for   In-house Referral: Clinical Social Work   Post Acute Care Choice: Durable Medical Equipment              Social  Drivers of Health (SDOH) Interventions SDOH Screenings   Food Insecurity: No Food Insecurity (07/24/2023)  Housing: High Risk (07/24/2023)  Transportation Needs: No Transportation Needs (07/24/2023)  Utilities: Not At Risk (07/24/2023)  Depression (PHQ2-9): Low Risk  (10/04/2020)  Tobacco Use: Low Risk  (07/23/2023)     Readmission Risk Interventions    07/24/2023   12:32 PM 05/17/2023   10:53 AM 05/14/2023    2:31 PM  Readmission Risk Prevention Plan  Transportation Screening Complete Complete Complete  PCP or Specialist Appt within 5-7 Days   Complete  PCP or Specialist Appt within 3-5 Days  Not Complete   Home Care Screening   Complete  Medication Review (RN CM)   Complete  HRI or Home Care Consult  Not Complete   Social Work Consult for Recovery Care Planning/Counseling  Complete   Palliative Care Screening  Not Applicable   Medication Review [employer]) Complete Complete   HRI or Home Care Consult Complete    SW Recovery Care/Counseling Consult Complete    Palliative Care Screening Not Applicable    Skilled Nursing Facility Complete

## 2023-07-24 NOTE — ED Notes (Signed)
Walked into pts room to find the pts SpO2 decreased at 85% on RA. This RN placed the pt on 3L [place] to increase pts SpO2 to 94%. Pt denies sleep apnea dx.

## 2023-07-25 DIAGNOSIS — R197 Diarrhea, unspecified: Secondary | ICD-10-CM | POA: Diagnosis not present

## 2023-07-25 DIAGNOSIS — R1010 Upper abdominal pain, unspecified: Secondary | ICD-10-CM | POA: Diagnosis not present

## 2023-07-25 DIAGNOSIS — R112 Nausea with vomiting, unspecified: Secondary | ICD-10-CM | POA: Diagnosis not present

## 2023-07-25 DIAGNOSIS — I160 Hypertensive urgency: Secondary | ICD-10-CM | POA: Diagnosis not present

## 2023-07-25 LAB — BASIC METABOLIC PANEL
Anion gap: 8 (ref 5–15)
BUN: 15 mg/dL (ref 6–20)
CO2: 26 mmol/L (ref 22–32)
Calcium: 7.7 mg/dL — ABNORMAL LOW (ref 8.9–10.3)
Chloride: 100 mmol/L (ref 98–111)
Creatinine, Ser: 1.8 mg/dL — ABNORMAL HIGH (ref 0.44–1.00)
GFR, Estimated: 33 mL/min — ABNORMAL LOW (ref >60)
Glucose, Bld: 169 mg/dL — ABNORMAL HIGH (ref 70–99)
Potassium: 3.4 mmol/L — ABNORMAL LOW (ref 3.5–5.1)
Sodium: 134 mmol/L — ABNORMAL LOW (ref 135–145)

## 2023-07-25 LAB — GLUCOSE, CAPILLARY
Glucose-Capillary: 172 mg/dL — ABNORMAL HIGH (ref 70–99)
Glucose-Capillary: 191 mg/dL — ABNORMAL HIGH (ref 70–99)
Glucose-Capillary: 174 mg/dL — ABNORMAL HIGH (ref 70–99)
Glucose-Capillary: 211 mg/dL — ABNORMAL HIGH (ref 70–99)

## 2023-07-25 LAB — MAGNESIUM: Magnesium: 1.8 mg/dL (ref 1.7–2.4)

## 2023-07-25 LAB — MRSA NEXT GEN BY PCR, NASAL: MRSA by PCR Next Gen: NOT DETECTED

## 2023-07-25 MED ORDER — POTASSIUM CHLORIDE 20 MEQ PO PACK: 40.0000 meq | PACK | Freq: Once | ORAL | Status: DC

## 2023-07-25 MED ORDER — METOCLOPRAMIDE HCL 5 MG/ML IJ SOLN
5.0000 mg | Freq: Three times a day (TID) | INTRAMUSCULAR | Status: DC
  Administered 2023-07-25 – 2023-07-26 (×4): 5 mg via INTRAVENOUS
  Filled 2023-07-25 (×4): qty 2

## 2023-07-25 MED ORDER — POTASSIUM CHLORIDE 20 MEQ PO PACK
40.0000 meq | PACK | ORAL | Status: AC
  Administered 2023-07-25 (×2): 40 meq via ORAL
  Filled 2023-07-25 (×2): qty 2

## 2023-07-25 MED ORDER — BISMUTH SUBSALICYLATE 262 MG/15ML PO SUSP: 30.0000 mL | ORAL | Status: DC | PRN

## 2023-07-25 MED ORDER — SODIUM CHLORIDE 0.9 % IV SOLN: INTRAVENOUS | Status: AC

## 2023-07-25 NOTE — Progress Notes (Signed)
Michelle Skinner    Progress Skinner  Michelle Skinner  VHQ:[id number] DOB: 1966-01-22 DOA: 07/23/2023 PCP: Oneita Hurt, No     Brief Narrative:   Michelle Skinner is an 58 y.o. female past medical history diabetes mellitus with gastroparesis, osteomyelitis status post bilateral BKA, chronic kidney disease stage III, hypothyroidism morbid obesity comes in from skilled nursing facility for nausea vomiting and diarrhea that started 5 days prior to admission, she relates she has been having watery stools which is now resolved.  Is having lower abdominal pain.    Assessment/Plan:   Probable viral gastroenteritis in the setting of gastroparesis: CT scan of the abdomen pelvis showed no acute findings.,  Did show fatty liver. Still has persistent nausea, she relates she tolerated very little of a clear liquid diet as it made her very nauseated. Will go ahead and start her on scheduled Reglan.  Keep her on a clear liquid diet. Monitor and replete electrolytes as needed.  Essential hypertension/hypertensive urgency: Likely from intolerable orals. Now on Coreg, clonidine and low-dose ACE inhibitor her blood pressure is well-controlled.  Incidental fatty liver on imaging: Noted.  Chronic kidney disease stage IIIb: Her creatinine remained at his baseline. 1.5-1.8   DM (diabetes mellitus), type 2 with peripheral vascular complications (HCC) Resume her long-acting insulin, continue sliding scale  Hypothyroidism Continue Synthroid.  BMI 50.0-59.9, adult [facility]) Noted, she has been counseled.  Hypokalemia Replete orally recheck in the morning.  Hypomagnesemia: Replete IV recheck in the morning.  Prolonged QTc: Try potassium greater than 4 magnesium greater than 2.  Pseudo hypocalcemia Corrected for albumin is unremarkable.  Normocytic anemia: Hemoglobin close to baseline monitor closely.   DVT prophylaxis: lovenox Family Communication:none Status is: Inpatient Remains  inpatient appropriate because: Viral gastroenteritis with electrolyte imbalance.    Code Status:     Code Status Orders  (From admission, onward)           Start     Ordered   07/23/23 2340  Full code  Continuous       Question:  By:  Answer:  Other   07/23/23 2341           Code Status History     Date Active Date Inactive Code Status Order ID Comments User Context   05/13/2023 1657 05/17/2023 1927 Full Code [id number]  Michelle Gilford, DO ED   09/06/2020 1739 09/17/2020 1959 Full Code [id number]  Michelle Navy, MD ED   04/27/2020 1141 05/04/2020 1948 Full Code [id number]  Michelle Pretty, MD ED      Advance Directive Documentation    Flowsheet Row Most Recent Value  Type of Advance Directive Out of facility DNR (pink MOST or yellow form)  Pre-existing out of facility DNR order (yellow form or pink MOST form) --  "MOST" Form in Place? --         IV Access:   Peripheral IV   Procedures and diagnostic studies:   CT ABDOMEN PELVIS W CONTRAST Result Date: 07/23/2023 CLINICAL DATA:  Abdominal pain.  Nausea vomiting and diarrhea. EXAM: CT ABDOMEN AND PELVIS WITH CONTRAST TECHNIQUE: Multidetector CT imaging of the abdomen and pelvis was performed using the standard protocol following bolus administration of intravenous contrast. RADIATION DOSE REDUCTION: This exam was performed according to the departmental dose-optimization program which includes automated exposure control, adjustment of the mA and/or kV according to patient size and/or use of iterative reconstruction technique. CONTRAST:  OMNIPAQUE IOHEXOL 300 MG/ML  SOLN COMPARISON:  CT  abdomen pelvis dated 12/18/2020. FINDINGS: Evaluation of this exam is limited due to body habitus and respiratory motion. Lower chest: The visualized lung bases are clear. No intra-abdominal free air or free fluid. Hepatobiliary: Fatty liver. Slight irregularity of the liver contour suggestive of early cirrhosis. Clinical  correlation recommended. No biliary dilatation. The gallbladder is unremarkable. Pancreas: The pancreas is unremarkable as visualized. Spleen: Normal in size without focal abnormality. Adrenals/Urinary Tract: The adrenal glands are unremarkable. There is no hydronephrosis on either side. There is symmetric enhancement and excretion of contrast by both kidneys. The visualized ureters and urinary bladder appear unremarkable. Stomach/Bowel: Mild distal colonic diverticulosis without active inflammatory changes. There is no bowel obstruction or active inflammation. The appendix is normal. Vascular/Lymphatic: Mild aortoiliac atherosclerotic disease. The IVC is unremarkable. No portal venous gas. There is no adenopathy. Reproductive: Hysterectomy.  No suspicious adnexal masses Other: None Musculoskeletal: No acute osseous pathology. IMPRESSION: 1. No acute intra-abdominal or pelvic pathology. 2. Mild distal colonic diverticulosis. No bowel obstruction. Normal appendix. 3. Fatty liver with possible early cirrhosis. 4.  Aortic Atherosclerosis (ICD10-I70.0). Electronically Signed   By: Elgie Collard M.D.   On: 07/23/2023 18:44     Medical Consultants:   None.   Subjective:    Michelle Skinner relates she vomited once yesterday still not very nauseated especially when she tries to eat.  Objective:    Vitals:   07/24/23 2230 07/25/23 0000 07/25/23 0410 07/25/23 0514  BP:  (!) 108/52 (!) 104/56 (!) 94/46  Pulse:   62 64  Resp: 15 16 16 18   Temp:  98.4 F (36.9 C) 98.2 F (36.8 C) 97.7 F (36.5 C)  TempSrc:  Oral Oral Oral  SpO2: 100%  100% 100%  Weight:      Height:       SpO2: 100 % O2 Flow Rate (L/min): 2 L/min   Intake/Output Summary (Last 24 hours) at 07/25/2023 0742 Last data filed at 07/25/2023 0600 Gross per 24 hour  Intake 879.59 ml  Output 200 ml  Net 679.59 ml   Filed Weights   07/23/23 1620  Weight: (!) 203.3 kg    Exam: General exam: In no acute distress. Respiratory  system: Good air movement and clear to auscultation. Cardiovascular system: S1 & S2 heard, RRR. No JVD. Gastrointestinal system: Abdomen is nondistended, soft and nontender.  Extremities: No pedal edema. Skin: No rashes, lesions or ulcers Psychiatry: Judgement and insight appear normal. Mood & affect appropriate.  Data Reviewed:    Labs: Basic Metabolic Panel: Recent Labs  Lab 07/23/23 1700 07/24/23 0436 07/25/23 0545  NA 138 135 134*  K 2.9* 3.3* 3.4*  CL 98 100 100  CO2 31 26 26   GLUCOSE 218* 218* 169*  BUN 13 11 15   CREATININE 1.33* 1.18* 1.80*  CALCIUM 8.1* 7.5* 7.7*  MG  --  1.5* 1.8  PHOS  --  3.0  --    GFR Estimated Creatinine Clearance: 59.8 mL/min (A) (by C-G formula based on SCr of 1.8 mg/dL (H)). Liver Function Tests: Recent Labs  Lab 07/23/23 1700  AST 16  ALT 14  ALKPHOS 95  BILITOT 0.9  PROT 6.8  ALBUMIN 3.0*   Recent Labs  Lab 07/23/23 1700  LIPASE 23   No results for input(s): "AMMONIA" in the last 168 hours. Coagulation profile No results for input(s): "INR", "PROTIME" in the last 168 hours. COVID-19 Labs  No results for input(s): "DDIMER", "FERRITIN", "LDH", "CRP" in the last 72 hours.  Lab Results  Component Value Date   SARSCOV2NAA NEGATIVE 09/16/2020   SARSCOV2NAA NEGATIVE 09/06/2020   SARSCOV2NAA NEGATIVE 05/02/2020   SARSCOV2NAA NEGATIVE 04/27/2020    CBC: Recent Labs  Lab 07/23/23 1700 07/24/23 0436  WBC 7.5 6.5  NEUTROABS 5.9  --   HGB 11.0* 9.6*  HCT 33.4* 30.0*  MCV 90.0 92.6  PLT 268 262   Cardiac Enzymes: No results for input(s): "CKTOTAL", "CKMB", "CKMBINDEX", "TROPONINI" in the last 168 hours. BNP (last 3 results) No results for input(s): "PROBNP" in the last 8760 hours. CBG: Recent Labs  Lab 07/24/23 0841 07/24/23 1317 07/24/23 1712 07/24/23 2229 07/25/23 0738  GLUCAP 167* 182* 218* 200* 172*   D-Dimer: No results for input(s): "DDIMER" in the last 72 hours. Hgb A1c: No results for input(s):  "HGBA1C" in the last 72 hours. Lipid Profile: No results for input(s): "CHOL", "HDL", "LDLCALC", "TRIG", "CHOLHDL", "LDLDIRECT" in the last 72 hours. Thyroid function studies: Recent Labs    07/24/23 0436  TSH 1.985   Anemia work up: No results for input(s): "VITAMINB12", "FOLATE", "FERRITIN", "TIBC", "IRON", "RETICCTPCT" in the last 72 hours. Sepsis Labs: Recent Labs  Lab 07/23/23 1700 07/24/23 0436  WBC 7.5 6.5   Microbiology Recent Results (from the past 240 hours)  MRSA Next Gen by PCR, Nasal     Status: None   Collection Time: 07/24/23  6:24 PM   Specimen: Nasal Mucosa; Nasal Swab  Result Value Ref Range Status   MRSA by PCR Next Gen NOT DETECTED NOT DETECTED Final    Comment: (Skinner) The GeneXpert MRSA Assay (FDA approved for NASAL specimens only), is one component of a comprehensive MRSA colonization surveillance program. It is not intended to diagnose MRSA infection nor to guide or monitor treatment for MRSA infections. Test performance is not FDA approved in patients less than 22 years old. Performed at [facility], [address], [place], [place] [address]      Medications:    atorvastatin  80 mg Oral Daily   buPROPion ER  150 mg Oral q morning   carvedilol  12.5 mg Oral BID   cloNIDine  0.1 mg Oral q1800   enoxaparin (LOVENOX) injection  80 mg Subcutaneous Q24H   feeding supplement  1 Container Oral TID BM   insulin aspart  0-5 Units Subcutaneous QHS   insulin aspart  0-6 Units Subcutaneous TID WC   insulin aspart  3 Units Subcutaneous TID WC   insulin glargine-yfgn  6 Units Subcutaneous BID   lamoTRIgine  50 mg Oral BID   levothyroxine  150 mcg Oral Q0600   lisinopril  5 mg Oral Daily   pantoprazole  40 mg Oral Daily   pneumococcal 20-valent conjugate vaccine  0.5 mL Intramuscular Tomorrow-1000   potassium chloride  40 mEq Oral Once   sodium chloride flush  3 mL Intravenous Q12H   Continuous Infusions:      LOS: 2 days   Marinda Elk  [facility]  07/25/2023, 7:42 AM

## 2023-07-25 NOTE — [place] FL2 (Signed)
[place] MEDICAID FL2 LEVEL OF CARE FORM     IDENTIFICATION  Patient Name: Michelle Skinner Birthdate: December 23, 1965 Sex: female Admission Date (Current Location): 07/23/2023  [facility] [place] and [place] Number:  [employer] and Address:  [facility],  [address] [place] [address]      Provider Number: [id number]  Attending Physician Name and Address:  David Stall, Darin Engels, MD  Relative Name and Phone Number:  Debbe Bales ( nephew ) [phone]    Current Level of Care: Hospital Recommended Level of Care: Nursing Facility Prior Approval Number:    Date Approved/Denied:   PASRR Number: [id number]  Discharge Plan: SNF    Current Diagnoses: Patient Active Problem List   Diagnosis Date Noted   Gastroenteritis 07/24/2023   Hypokalemia 07/24/2023   Fatty liver 07/24/2023   DM (diabetes mellitus), type 2 with peripheral vascular complications (HCC) 07/24/2023   Hypertensive urgency 07/23/2023   Insulin long-term use (HCC) 12/26/2022   Peripherally inserted central catheter (PICC) in place 10/05/2020   MSSA bacteremia    Acute hematogenous osteomyelitis of both feet (HCC)    Severe protein-calorie malnutrition (HCC)    BMI 50.0-59.9, adult (HCC)    Plantar ulcer of right foot (HCC)    Plantar ulcer of left foot (HCC)    Lymphedema    Sepsis (HCC) 09/06/2020   Cellulitis 04/27/2020   AKI (acute kidney injury) (HCC) 04/27/2020   Stasis dermatitis 04/27/2020   Anxiety 03/31/2020   Depression 03/31/2020   GERD (gastroesophageal reflux disease) 03/31/2020   Posttraumatic stress disorder 03/31/2020   Personal history of noncompliance with medical treatment, presenting hazards to health 02/09/2018   Abnormal CT of the abdomen 10/20/2017   Dilated cbd, acquired 10/20/2017   Essential hypertension, benign 01/14/2017   Hypothyroidism 05/03/2015   Mixed hyperlipidemia 05/03/2015   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   Obesity 04/25/2015    Vitamin D deficiency 04/25/2015   Cervical cancer (HCC) 10/05/2012    Orientation RESPIRATION BLADDER Height & Weight     Self, Time, Situation, Place  O2 (2L) External catheter Weight: (!) 448 lb 3.1 oz (203.3 kg) Height:  5' (152.4 cm)  BEHAVIORAL SYMPTOMS/MOOD NEUROLOGICAL BOWEL NUTRITION STATUS      Incontinent Diet (See DC summary)  AMBULATORY STATUS COMMUNICATION OF NEEDS Skin   Extensive Assist Verbally Normal                       Personal Care Assistance Level of Assistance  Bathing, Feeding, Dressing Bathing Assistance: Maximum assistance Feeding assistance: Independent Dressing Assistance: Maximum assistance     Functional Limitations Info  Sight, Hearing, Speech Sight Info: Adequate Hearing Info: Adequate Speech Info: Adequate    SPECIAL CARE FACTORS FREQUENCY                       Contractures      Additional Factors Info  Code Status, Allergies, Psychotropic, Insulin Sliding Scale Code Status Info: FULL Allergies Info: Sulfamethoxazole-trimethoprim,  Canagliflozin Clindamycin/lincomycin,  Coenzyme Q10-black Pepper, Latex, Metformin And Related, Ozempic (0.25 Or 0.5 Mg-dose) (semaglutide(0.25 Or 0.5mg -dos),  Polyurethane (polyurethane), tape, Cherry,Gabapentin Psychotropic Info: buPROPion ER Insulin Sliding Scale Info: insulin aspart   0-5 Units  Subcutaneous  QHS , insulin aspart   0-6 Units  Subcutaneous  TID WC ,  insulin aspart   3 Units  Subcutaneous  TID WC,  insulin glargine-yfgn   6 Units  Subcutaneous  BID  Current Medications (07/25/2023):  This is the current hospital active medication list Current Facility-Administered Medications  Medication Dose Route Frequency Provider Last Rate Last Admin   0.9 %  sodium chloride infusion   Intravenous Continuous Marinda Elk, MD 75 mL/hr at 07/25/23 0829 New Bag at 07/25/23 0829   acetaminophen (TYLENOL) tablet 1,000 mg  1,000 mg Oral Q6H PRN Dolly Rias, MD   1,000 mg at  07/25/23 0524   albuterol (PROVENTIL) (2.5 MG/3ML) 0.083% nebulizer solution 2.5 mg  2.5 mg Nebulization Q4H PRN Dolly Rias, MD       atorvastatin (LIPITOR) tablet 80 mg  80 mg Oral Daily Segars, Christiane Ha, MD   80 mg at 07/25/23 1201   bismuth subsalicylate (PEPTO BISMOL) 262 MG/15ML suspension 30 mL  30 mL Oral Q4H PRN Dolly Rias, MD       buPROPion (WELLBUTRIN SR) 12 hr tablet 150 mg  150 mg Oral q morning Dolly Rias, MD   150 mg at 07/25/23 1201   carvedilol (COREG) tablet 12.5 mg  12.5 mg Oral BID Dolly Rias, MD   12.5 mg at 07/25/23 1202   cloNIDine (CATAPRES) tablet 0.1 mg  0.1 mg Oral q1800 Marinda Elk, MD   0.1 mg at 07/24/23 1822   enoxaparin (LOVENOX) injection 80 mg  80 mg Subcutaneous Q24H Dolly Rias, MD   80 mg at 07/25/23 1203   feeding supplement (BOOST / RESOURCE BREEZE) liquid 1 Container  1 Container Oral TID BM Marinda Elk, MD   1 Container at 07/25/23 1203   insulin aspart (novoLOG) injection 0-5 Units  0-5 Units Subcutaneous QHS Marinda Elk, MD       insulin aspart (novoLOG) injection 0-6 Units  0-6 Units Subcutaneous TID WC Marinda Elk, MD   1 Units at 07/25/23 1205   insulin aspart (novoLOG) injection 3 Units  3 Units Subcutaneous TID WC Marinda Elk, MD   3 Units at 07/25/23 1205   insulin glargine-yfgn (SEMGLEE) injection 6 Units  6 Units Subcutaneous BID Marinda Elk, MD   6 Units at 07/25/23 1202   labetalol (NORMODYNE) injection 10 mg  10 mg Intravenous Q2H PRN Dolly Rias, MD   10 mg at 07/24/23 0157   lamoTRIgine (LAMICTAL) tablet 50 mg  50 mg Oral BID Dolly Rias, MD   50 mg at 07/25/23 1201   levothyroxine (SYNTHROID) tablet 150 mcg  150 mcg Oral Q0600 Dolly Rias, MD   150 mcg at 07/25/23 0524   lisinopril (ZESTRIL) tablet 5 mg  5 mg Oral Daily Marinda Elk, MD   5 mg at 07/25/23 1202   LORazepam (ATIVAN) injection 0.5 mg  0.5 mg Intravenous Q4H PRN Dolly Rias, MD   0.5 mg at 07/24/23 0202   melatonin tablet 6 mg  6 mg Oral QHS PRN Dolly Rias, MD       metoCLOPramide (REGLAN) injection 5 mg  5 mg Intravenous Q8H Marinda Elk, MD   5 mg at 07/25/23 0824   ondansetron (ZOFRAN) injection 4 mg  4 mg Intravenous Q8H PRN Marinda Elk, MD   4 mg at 07/24/23 1440   pantoprazole (PROTONIX) EC tablet 40 mg  40 mg Oral Daily Segars, Christiane Ha, MD   40 mg at 07/25/23 1202   polyethylene glycol (MIRALAX / GLYCOLAX) packet 17 g  17 g Oral Daily PRN Dolly Rias, MD       sodium chloride flush (NS) 0.9 % injection 3 mL  3  mL Intravenous Q12H Dolly Rias, MD   3 mL at 07/24/23 2200     Discharge Medications: Please see discharge summary for a list of discharge medications.  Relevant Imaging Results:  Relevant Lab Results:   Additional Information [id number]  Isabella Bowens, [place]

## 2023-07-25 NOTE — Care Management Important Message (Signed)
Important Message  Patient Details  Name: Michelle Skinner MRN: [id number] Date of Birth: 1965/09/15   Important Message Given:  Yes - Medicare IM     Corey Harold 07/25/2023, 1:54 PM

## 2023-07-26 DIAGNOSIS — R112 Nausea with vomiting, unspecified: Secondary | ICD-10-CM | POA: Diagnosis not present

## 2023-07-26 DIAGNOSIS — I160 Hypertensive urgency: Secondary | ICD-10-CM | POA: Diagnosis not present

## 2023-07-26 DIAGNOSIS — R197 Diarrhea, unspecified: Secondary | ICD-10-CM | POA: Diagnosis not present

## 2023-07-26 LAB — BASIC METABOLIC PANEL
Anion gap: 11 (ref 5–15)
BUN: 24 mg/dL — ABNORMAL HIGH (ref 6–20)
CO2: 26 mmol/L (ref 22–32)
Calcium: 7.7 mg/dL — ABNORMAL LOW (ref 8.9–10.3)
Chloride: 100 mmol/L (ref 98–111)
Creatinine, Ser: 2.35 mg/dL — ABNORMAL HIGH (ref 0.44–1.00)
GFR, Estimated: 24 mL/min — ABNORMAL LOW (ref >60)
Glucose, Bld: 256 mg/dL — ABNORMAL HIGH (ref 70–99)
Potassium: 3.8 mmol/L (ref 3.5–5.1)
Sodium: 137 mmol/L (ref 135–145)

## 2023-07-26 LAB — GLUCOSE, CAPILLARY
Glucose-Capillary: 234 mg/dL — ABNORMAL HIGH (ref 70–99)
Glucose-Capillary: 251 mg/dL — ABNORMAL HIGH (ref 70–99)

## 2023-07-26 MED ORDER — LISINOPRIL 5 MG PO TABS: 5.0000 mg | ORAL_TABLET | Freq: Every day | ORAL | Status: DC

## 2023-07-26 MED ORDER — INSULIN GLARGINE-YFGN 100 UNIT/ML ~~LOC~~ SOLN
15.0000 [IU] | Freq: Two times a day (BID) | SUBCUTANEOUS | Status: DC
  Administered 2023-07-26: 15 [IU] via SUBCUTANEOUS
  Filled 2023-07-26 (×3): qty 0.15

## 2023-07-26 NOTE — Discharge Summary (Signed)
Physician Discharge Summary  Michelle Skinner VOZ:[id number] DOB: 26-May-1966 DOA: 07/23/2023  PCP: Oneita Hurt, No  Admit date: 07/23/2023 Discharge date: 07/26/2023  Admitted From: SNF Disposition:  SNF  Recommendations for Outpatient Follow-up:  Follow up with GI in 1-2 weeks Please obtain BMP/CBC in one week   Home Health:No Equipment/Devices:None  Discharge Condition:Stable CODE STATUS:Full Diet recommendation: Heart Healthy   Brief/Interim Summary: 58 y.o. female past medical history diabetes mellitus with gastroparesis, osteomyelitis status post bilateral BKA, chronic kidney disease stage III, hypothyroidism morbid obesity comes in from skilled nursing facility for nausea vomiting and diarrhea that started 5 days prior to admission, she relates she has been having watery stools which is now resolved.  Is having lower abdominal pain.    Discharge Diagnoses:  Principal Problem:   Hypertensive urgency Active Problems:   Hypothyroidism   Essential hypertension, benign   BMI 50.0-59.9, adult (HCC)   Gastroenteritis   Hypokalemia   Fatty liver   DM (diabetes mellitus), type 2 with peripheral vascular complications (HCC)  Probable viral gastroenteritis in the setting of gastroparesis: CT scan of the abdomen pelvis showed no acute findings, did show fatty liver. She was started on Zofran and Reglan. After 3 days she was able to tolerate her clear liquid diet which was advanced. Diarrhea improved treatment. She will continue Reglan as an outpatient.  Central hypertension/hypertensive urgency: Likely due to not able to tolerate oral medications due to nausea and vomiting. She was restarted on her home meds and her blood pressure remained stable. She was started on low-dose ACE which she will continue as an outpatient.  Incidental fatty liver on imaging: Noted.  Chronic kidney disease stage IIIb: Her creatinine remained at baseline 1.5-1.8.  Diabetes mellitus type 2 with  peripheral vascular disease: No changes made to her insulin regimen she will continue current regimen as an outpatient.  Hypothyroidism: Continue Synthroid.  Hypokalemia: Likely due to nausea and vomiting repleted now resolved.  Hypomagnesemia: Likely due to vomiting was repleted now resolved.  Prolonged QTc: Likely due to electrolyte imbalance.  Pseudo hypocalcemia Corrected for albumin was unremarkable.    Discharge Instructions  Discharge Instructions     Diet - low sodium heart healthy   Complete by: As directed    Increase activity slowly   Complete by: As directed       Allergies as of 07/26/2023       Reactions   Sulfa Antibiotics Diarrhea, Nausea And Vomiting, Rash   Canagliflozin Diarrhea, Nausea And Vomiting, Other (See Comments)   Unknown   Clindamycin/lincomycin Diarrhea, Nausea And Vomiting   Coenzyme Q10-black Pepper    Latex    Rash, itching, over a long period of time   Metformin And Related Diarrhea, Nausea And Vomiting   Ozempic (0.25 Or 0.5 Mg-dose) [semaglutide(0.25 Or 0.5mg -dos)] Diarrhea, Nausea And Vomiting   Polyurethane [polyurethane]    Blistering and swelling   Tape Itching, Swelling   Reaction after a few days use/ please use paper tape   Cherry Itching, Rash, Swelling, Other (See Comments)   Throat swelling Unknown    Throat swelling   Gabapentin Diarrhea, Nausea And Vomiting, Rash   Unknown   Other Itching, Swelling, Rash   Reaction to Hot peppers (throat swelling)   Sulfamethoxazole-trimethoprim Diarrhea, Nausea And Vomiting, Rash, Itching        Medication List     STOP taking these medications    FLUoxetine 40 MG capsule Commonly known as: PROZAC       TAKE  these medications    acetaminophen 325 MG tablet Commonly known as: TYLENOL Take 650 mg by mouth every 6 (six) hours as needed for moderate pain (pain score 4-6).   atorvastatin 80 MG tablet Commonly known as: LIPITOR Take 80 mg by mouth daily.    buPROPion 150 MG 24 hr tablet Commonly known as: WELLBUTRIN XL Take 150 mg by mouth daily.   carvedilol 12.5 MG tablet Commonly known as: Coreg Take 1 tablet (12.5 mg total) by mouth 2 (two) times daily.   cloNIDine 0.1 MG tablet Commonly known as: CATAPRES Take 0.1 mg by mouth at bedtime.   folic acid 1 MG tablet Commonly known as: FOLVITE Take 1 tablet (1 mg total) by mouth daily.   FreeStyle Libre 2 Reader Hardie Pulley As directed   [employer] 2 Sensor Misc 1 Piece by Does not apply route every 14 (fourteen) days.   Iron 325 (65 Fe) MG Tabs Take 1 tablet by mouth at bedtime.   lamoTRIgine 25 MG tablet Commonly known as: LAMICTAL Take 25 mg by mouth 2 (two) times daily.   levothyroxine 150 MCG tablet Commonly known as: SYNTHROID Take 1 tablet (150 mcg total) by mouth daily before breakfast.   lisinopril 5 MG tablet Commonly known as: ZESTRIL Take 1 tablet (5 mg total) by mouth daily.   Lokelma 10 g Pack packet Generic drug: sodium zirconium cyclosilicate Take 1 packet by mouth daily.   metoCLOPramide 10 MG tablet Commonly known as: REGLAN Take 1 tablet (10 mg total) by mouth every 6 (six) hours as needed for nausea or vomiting.   Narcan 4 MG/0.1ML Liqd nasal spray kit Generic drug: naloxone Place 1 spray into the nose once.   NovoLOG FlexPen 100 UNIT/ML FlexPen Generic drug: insulin aspart Inject 1-14 Units into the skin 6 (six) times daily. Inject 14 units three times a day 10 minutes before meals, then 0-8 units with meals depending on blood sugar reading. 151-200 = 1 201-250 = 2 251-300 = 4 301-350 = 6 351-400 = 8   nystatin powder Commonly known as: MYCOSTATIN/NYSTOP Apply 1 Application topically 3 (three) times daily. Under breasts for redness   omega-3 acid ethyl esters 1 g capsule Commonly known as: LOVAZA Take 1 capsule by mouth daily.   pantoprazole 40 MG tablet Commonly known as: PROTONIX TAKE ONE (1) TABLET EACH DAY   saccharomyces  boulardii 250 MG capsule Commonly known as: FLORASTOR Take 250 mg by mouth 2 (two) times daily.   SEMGLEE (YFGN) [place] Inject 17 Units into the skin at bedtime.   VITAMIN B-1 PO Take 1 tablet by mouth daily.   VITAMIN C PO Take 1 tablet by mouth at bedtime.   Vitamin D-3 125 MCG (5000 UT) Tabs Take 1 tablet by mouth daily.        Allergies  Allergen Reactions   Sulfa Antibiotics Diarrhea, Nausea And Vomiting and Rash   Canagliflozin Diarrhea, Nausea And Vomiting and Other (See Comments)    Unknown   Clindamycin/Lincomycin Diarrhea and Nausea And Vomiting   Coenzyme Q10-Black Pepper    Latex     Rash, itching, over a long period of time   Metformin And Related Diarrhea and Nausea And Vomiting   Ozempic (0.25 Or 0.5 Mg-Dose) [Semaglutide(0.25 Or 0.5mg -Dos)] Diarrhea and Nausea And Vomiting   Polyurethane [Polyurethane]     Blistering and swelling   Tape Itching and Swelling    Reaction after a few days use/ please use paper tape   Cherry Itching, Rash,  Swelling and Other (See Comments)    Throat swelling  Unknown    Throat swelling   Gabapentin Diarrhea, Nausea And Vomiting and Rash    Unknown   Other Itching, Swelling and Rash    Reaction to Hot peppers (throat swelling)   Sulfamethoxazole-Trimethoprim Diarrhea, Nausea And Vomiting, Rash and Itching    Consultations: None   Procedures/Studies: CT ABDOMEN PELVIS W CONTRAST Result Date: 07/23/2023 CLINICAL DATA:  Abdominal pain.  Nausea vomiting and diarrhea. EXAM: CT ABDOMEN AND PELVIS WITH CONTRAST TECHNIQUE: Multidetector CT imaging of the abdomen and pelvis was performed using the standard protocol following bolus administration of intravenous contrast. RADIATION DOSE REDUCTION: This exam was performed according to the departmental dose-optimization program which includes automated exposure control, adjustment of the mA and/or kV according to patient size and/or use of iterative reconstruction technique. CONTRAST:   OMNIPAQUE IOHEXOL 300 MG/ML  SOLN COMPARISON:  CT abdomen pelvis dated 12/18/2020. FINDINGS: Evaluation of this exam is limited due to body habitus and respiratory motion. Lower chest: The visualized lung bases are clear. No intra-abdominal free air or free fluid. Hepatobiliary: Fatty liver. Slight irregularity of the liver contour suggestive of early cirrhosis. Clinical correlation recommended. No biliary dilatation. The gallbladder is unremarkable. Pancreas: The pancreas is unremarkable as visualized. Spleen: Normal in size without focal abnormality. Adrenals/Urinary Tract: The adrenal glands are unremarkable. There is no hydronephrosis on either side. There is symmetric enhancement and excretion of contrast by both kidneys. The visualized ureters and urinary bladder appear unremarkable. Stomach/Bowel: Mild distal colonic diverticulosis without active inflammatory changes. There is no bowel obstruction or active inflammation. The appendix is normal. Vascular/Lymphatic: Mild aortoiliac atherosclerotic disease. The IVC is unremarkable. No portal venous gas. There is no adenopathy. Reproductive: Hysterectomy.  No suspicious adnexal masses Other: None Musculoskeletal: No acute osseous pathology. IMPRESSION: 1. No acute intra-abdominal or pelvic pathology. 2. Mild distal colonic diverticulosis. No bowel obstruction. Normal appendix. 3. Fatty liver with possible early cirrhosis. 4.  Aortic Atherosclerosis (ICD10-I70.0). Electronically Signed   By: Elgie Collard M.D.   On: 07/23/2023 18:44   DG Femur Min 2 Views Right Result Date: 07/01/2023 CLINICAL DATA:  Pain after a fall. Patient was dropped earlier today. EXAM: RIGHT FEMUR 2 VIEWS; RIGHT KNEE - COMPLETE 4+ VIEW COMPARISON:  None Available. FINDINGS: Body habitus limits examination. Degenerative changes are demonstrated in the right hip and right knee. No evidence of acute fracture or dislocation. Postoperative right below the knee amputation. Margin of  resection appears intact. Focal expansile deformity of the proximal fibula could represent an old fracture deformity or bone cyst. No periosteal reaction or cortical break. IMPRESSION: 1. No acute fracture or dislocation. 2. Degenerative changes in the right hip and right knee. 3. Right below the knee amputation. 4. Old fracture deformity versus bone cysts in the proximal fibula. Electronically Signed   By: Burman Nieves M.D.   On: 07/01/2023 00:08   DG Knee Complete 4 Views Right Result Date: 07/01/2023 CLINICAL DATA:  Pain after a fall. Patient was dropped earlier today. EXAM: RIGHT FEMUR 2 VIEWS; RIGHT KNEE - COMPLETE 4+ VIEW COMPARISON:  None Available. FINDINGS: Body habitus limits examination. Degenerative changes are demonstrated in the right hip and right knee. No evidence of acute fracture or dislocation. Postoperative right below the knee amputation. Margin of resection appears intact. Focal expansile deformity of the proximal fibula could represent an old fracture deformity or bone cyst. No periosteal reaction or cortical break. IMPRESSION: 1. No acute fracture or dislocation.  2. Degenerative changes in the right hip and right knee. 3. Right below the knee amputation. 4. Old fracture deformity versus bone cysts in the proximal fibula. Electronically Signed   By: Burman Nieves M.D.   On: 07/01/2023 00:08   DG Femur Min 2 Views Left Result Date: 07/01/2023 CLINICAL DATA:  Pain after a fall. Patient was dropped earlier today. EXAM: LEFT FEMUR 2 VIEWS; LEFT KNEE - COMPLETE 4+ VIEW COMPARISON:  None Available. FINDINGS: Body habitus limits evaluation. Left hip and femur appear intact. No evidence of acute fracture or dislocation. Postoperative changes with left below the knee amputation. Margins of resection and soft tissues appear intact. Degenerative changes in the left knee with medial greater than lateral compartment narrowing and moderate osteophyte formation. No significant effusions.  Vascular calcifications. IMPRESSION: 1. No acute fracture or dislocation. 2. Degenerative changes in the left knee. 3. Previous below the knee amputation. Electronically Signed   By: Burman Nieves M.D.   On: 07/01/2023 00:06   DG Knee Complete 4 Views Left Result Date: 07/01/2023 CLINICAL DATA:  Pain after a fall. Patient was dropped earlier today. EXAM: LEFT FEMUR 2 VIEWS; LEFT KNEE - COMPLETE 4+ VIEW COMPARISON:  None Available. FINDINGS: Body habitus limits evaluation. Left hip and femur appear intact. No evidence of acute fracture or dislocation. Postoperative changes with left below the knee amputation. Margins of resection and soft tissues appear intact. Degenerative changes in the left knee with medial greater than lateral compartment narrowing and moderate osteophyte formation. No significant effusions. Vascular calcifications. IMPRESSION: 1. No acute fracture or dislocation. 2. Degenerative changes in the left knee. 3. Previous below the knee amputation. Electronically Signed   By: Burman Nieves M.D.   On: 07/01/2023 00:06   (Echo, Carotid, EGD, Colonoscopy, ERCP)    Subjective: Feels better tolerating her diet.  Discharge Exam: Vitals:   07/25/23 2027 07/26/23 0540  BP: 136/73 131/60  Pulse: 65 74  Resp: 16 17  Temp: 97.9 F (36.6 C) 98.1 F (36.7 C)  SpO2: 100% 100%   Vitals:   07/25/23 0514 07/25/23 1319 07/25/23 2027 07/26/23 0540  BP: (!) 94/46 (!) 147/47 136/73 131/60  Pulse: 64 70 65 74  Resp: 18 14 16 17   Temp: 97.7 F (36.5 C) 98.5 F (36.9 C) 97.9 F (36.6 C) 98.1 F (36.7 C)  TempSrc: Oral Oral Oral Oral  SpO2: 100% 100% 100% 100%  Weight:      Height:        General: Pt is alert, awake, not in acute distress Cardiovascular: RRR, S1/S2 +, no rubs, no gallops Respiratory: CTA bilaterally, no wheezing, no rhonchi Abdominal: Soft, NT, ND, bowel sounds + Extremities: no edema, no cyanosis    The results of significant diagnostics from this  hospitalization (including imaging, microbiology, ancillary and laboratory) are listed below for reference.     Microbiology: Recent Results (from the past 240 hours)  MRSA Next Gen by PCR, Nasal     Status: None   Collection Time: 07/24/23  6:24 PM   Specimen: Nasal Mucosa; Nasal Swab  Result Value Ref Range Status   MRSA by PCR Next Gen NOT DETECTED NOT DETECTED Final    Comment: (NOTE) The GeneXpert MRSA Assay (FDA approved for NASAL specimens only), is one component of a comprehensive MRSA colonization surveillance program. It is not intended to diagnose MRSA infection nor to guide or monitor treatment for MRSA infections. Test performance is not FDA approved in patients less than 83 years old.  Performed at [facility], [address], [place], [place] [address]      Labs: BNP (last 3 results) No results for input(s): "BNP" in the last 8760 hours. Basic Metabolic Panel: Recent Labs  Lab 07/23/23 1700 07/24/23 0436 07/25/23 0545 07/26/23 0443  NA 138 135 134* 137  K 2.9* 3.3* 3.4* 3.8  CL 98 100 100 100  CO2 31 26 26 26   GLUCOSE 218* 218* 169* 256*  BUN 13 11 15  24*  CREATININE 1.33* 1.18* 1.80* 2.35*  CALCIUM 8.1* 7.5* 7.7* 7.7*  MG  --  1.5* 1.8  --   PHOS  --  3.0  --   --    Liver Function Tests: Recent Labs  Lab 07/23/23 1700  AST 16  ALT 14  ALKPHOS 95  BILITOT 0.9  PROT 6.8  ALBUMIN 3.0*   Recent Labs  Lab 07/23/23 1700  LIPASE 23   No results for input(s): "AMMONIA" in the last 168 hours. CBC: Recent Labs  Lab 07/23/23 1700 07/24/23 0436  WBC 7.5 6.5  NEUTROABS 5.9  --   HGB 11.0* 9.6*  HCT 33.4* 30.0*  MCV 90.0 92.6  PLT 268 262   Cardiac Enzymes: No results for input(s): "CKTOTAL", "CKMB", "CKMBINDEX", "TROPONINI" in the last 168 hours. BNP: Invalid input(s): "POCBNP" CBG: Recent Labs  Lab 07/24/23 2229 07/25/23 0738 07/25/23 1105 07/25/23 1610 07/25/23 2037  GLUCAP 200* 172* 191* 174* 211*   D-Dimer No results for  input(s): "DDIMER" in the last 72 hours. Hgb A1c No results for input(s): "HGBA1C" in the last 72 hours. Lipid Profile No results for input(s): "CHOL", "HDL", "LDLCALC", "TRIG", "CHOLHDL", "LDLDIRECT" in the last 72 hours. Thyroid function studies Recent Labs    07/24/23 0436  TSH 1.985   Anemia work up No results for input(s): "VITAMINB12", "FOLATE", "FERRITIN", "TIBC", "IRON", "RETICCTPCT" in the last 72 hours. Urinalysis    Component Value Date/Time   COLORURINE STRAW (A) 07/23/2023 1724   APPEARANCEUR CLEAR 07/23/2023 1724   LABSPEC 1.005 07/23/2023 1724   PHURINE 7.0 07/23/2023 1724   GLUCOSEU 50 (A) 07/23/2023 1724   HGBUR SMALL (A) 07/23/2023 1724   BILIRUBINUR NEGATIVE 07/23/2023 1724   KETONESUR NEGATIVE 07/23/2023 1724   PROTEINUR 100 (A) 07/23/2023 1724   UROBILINOGEN 0.2 08/05/2008 1400   NITRITE NEGATIVE 07/23/2023 1724   LEUKOCYTESUR NEGATIVE 07/23/2023 1724   Sepsis Labs Recent Labs  Lab 07/23/23 1700 07/24/23 0436  WBC 7.5 6.5   Microbiology Recent Results (from the past 240 hours)  MRSA Next Gen by PCR, Nasal     Status: None   Collection Time: 07/24/23  6:24 PM   Specimen: Nasal Mucosa; Nasal Swab  Result Value Ref Range Status   MRSA by PCR Next Gen NOT DETECTED NOT DETECTED Final    Comment: (NOTE) The GeneXpert MRSA Assay (FDA approved for NASAL specimens only), is one component of a comprehensive MRSA colonization surveillance program. It is not intended to diagnose MRSA infection nor to guide or monitor treatment for MRSA infections. Test performance is not FDA approved in patients less than 1 years old. Performed at [facility], [address] [place], [place] [address]      Time coordinating discharge: Over 35 minutes  SIGNED:   Marinda Elk, MD  [facility] 07/26/2023, 7:19 AM Pager   If 7PM-7AM, please contact night-coverage [email or web address] Password [email or web address]

## 2023-07-26 NOTE — TOC Transition Note (Signed)
Transition of Care [facility]) - Discharge Note   Patient Details  Name: MICA VILL MRN: [id number] Date of Birth: 1966-05-06  Transition of Care [facility]) CM/SW Contact:  Villa Herb, LCSWA Phone Number: 07/26/2023, 1:29 PM   Clinical Narrative:    CSW updated that pt is medically stable for D/C back to [facility] today. CSW spoke to [place] with [facility] who states they are ready to accept pt at this time. CSW faxed D/C clinicals to facility. Room and report provided to RN. Med necessity completed and sent to floor. EMS called. TOC signing off.   Final next level of care: Skilled Nursing Facility Barriers to Discharge: Barriers Resolved   Patient Goals and CMS Choice Patient states their goals for this hospitalization and ongoing recovery are:: return back to SNF CMS Medicare.gov Compare Post Acute Care list provided to:: Patient Choice offered to / list presented to : Patient [place] ownership interest in [facility].provided to:: Patient    Discharge Placement                       Discharge Plan and Services Additional resources added to the After Visit Summary for   In-house Referral: Clinical Social Work   Post Acute Care Choice: Durable Medical Equipment                               Social Drivers of Health (SDOH) Interventions SDOH Screenings   Food Insecurity: No Food Insecurity (07/24/2023)  Housing: High Risk (07/24/2023)  Transportation Needs: No Transportation Needs (07/24/2023)  Utilities: Not At Risk (07/24/2023)  Depression (PHQ2-9): Low Risk  (10/04/2020)  Tobacco Use: Low Risk  (07/23/2023)     Readmission Risk Interventions    07/24/2023   12:32 PM 05/17/2023   10:53 AM 05/14/2023    2:31 PM  Readmission Risk Prevention Plan  Transportation Screening Complete Complete Complete  PCP or Specialist Appt within 5-7 Days   Complete  PCP or Specialist Appt within 3-5 Days  Not Complete   Home Care Screening   Complete  Medication  Review (RN CM)   Complete  HRI or Home Care Consult  Not Complete   Social Work Consult for Recovery Care Planning/Counseling  Complete   Palliative Care Screening  Not Applicable   Medication Review Oceanographer) Complete Complete   HRI or Home Care Consult Complete    SW Recovery Care/Counseling Consult Complete    Palliative Care Screening Not Applicable    Skilled Nursing Facility Complete

## 2023-07-26 NOTE — Plan of Care (Signed)

## 2023-07-26 NOTE — Progress Notes (Signed)
Gave report to Brynda Greathouse, LPN at [facility].

## 2023-07-31 ENCOUNTER — Encounter: Payer: Self-pay | Admitting: "Endocrinology

## 2023-07-31 ENCOUNTER — Ambulatory Visit: Payer: 59 | Admitting: "Endocrinology

## 2023-07-31 VITALS — BP 110/60 | HR 76

## 2023-07-31 DIAGNOSIS — E782 Mixed hyperlipidemia: Secondary | ICD-10-CM | POA: Diagnosis not present

## 2023-07-31 DIAGNOSIS — Z794 Long term (current) use of insulin: Secondary | ICD-10-CM

## 2023-07-31 DIAGNOSIS — E1159 Type 2 diabetes mellitus with other circulatory complications: Secondary | ICD-10-CM

## 2023-07-31 DIAGNOSIS — I10 Essential (primary) hypertension: Secondary | ICD-10-CM | POA: Diagnosis not present

## 2023-07-31 DIAGNOSIS — E039 Hypothyroidism, unspecified: Secondary | ICD-10-CM

## 2023-07-31 DIAGNOSIS — E559 Vitamin D deficiency, unspecified: Secondary | ICD-10-CM

## 2023-07-31 NOTE — Patient Instructions (Signed)

## 2023-07-31 NOTE — Progress Notes (Signed)
07/31/2023  Endocrinology follow-up note  Subjective:    Patient ID: Michelle Skinner, female    DOB: Dec 03, 1965,    Past Medical History:  Diagnosis Date   Amputee, above knee, left (HCC)    Amputee, above knee, right (HCC)    Anemia    Cervical cancer (HCC)    Depression    Diabetes mellitus, type II (HCC)    GERD (gastroesophageal reflux disease)    Hyperlipidemia    Neuropathy    PTSD (post-traumatic stress disorder)    Past Surgical History:  Procedure Laterality Date   ABDOMINAL HYSTERECTOMY     AMPUTATION Bilateral 09/13/2020   Procedure: BILATERAL BELOW KNEE AMPUTATIONS;  Surgeon: Nadara Mustard, MD;  Location: [facility] OR;  Service: Orthopedics;  Laterality: Bilateral;   COLONOSCOPY WITH PROPOFOL N/A 05/25/2018   Procedure: COLONOSCOPY WITH PROPOFOL;  Surgeon: Corbin Ade, MD;  Location: [facility] ENDO SUITE;  Service: Endoscopy;  Laterality: N/A;  7:30am   I & D EXTREMITY Bilateral 09/10/2020   Procedure: IRRIGATION AND DEBRIDEMENT FEET;;  Surgeon: Nadara Mustard, MD;  Location: [facility] OR;  Service: Orthopedics;  Laterality: Bilateral;   IRRIGATION AND DEBRIDEMENT FOOT Bilateral 09/08/2020   Procedure: IRRIGATION AND DEBRIDEMENT FOOT;  Surgeon: Lucretia Roers, MD;  Location: [facility] ORS;  Service: General;  Laterality: Bilateral;  left foot 11 x 10.5 x 1    OTHER SURGICAL HISTORY Right    R Foot- I&D   POLYPECTOMY  05/25/2018   Procedure: POLYPECTOMY;  Surgeon: Corbin Ade, MD;  Location: [facility] ENDO SUITE;  Service: Endoscopy;;  colon   UMBILICAL HERNIA REPAIR  2007   Social History   Socioeconomic History   Marital status: Married    Spouse name: Not on file   Number of children: Not on file   Years of education: Not on file   Highest education level: Not on file  Occupational History   Not on file  Tobacco Use   Smoking status: Never   Smokeless tobacco: Never  Vaping Use   Vaping status: Never Used  Substance and Sexual Activity   Alcohol use: No    Alcohol/week: 0.0  standard drinks of alcohol   Drug use: No   Sexual activity: Yes    Birth control/protection: Surgical  Other Topics Concern   Not on file  Social History Narrative   Not on file   Social Drivers of Health   Financial Resource Strain: Not on file  Food Insecurity: No Food Insecurity (07/24/2023)   Hunger Vital Sign    Worried About Running Out of Food in the Last Year: Never true    Ran Out of Food in the Last Year: Never true  Transportation Needs: No Transportation Needs (07/24/2023)   PRAPARE - Administrator, Civil Service (Medical): No    Lack of Transportation (Non-Medical): No  Physical Activity: Not on file  Stress: Not on file  Social Connections: Not on file   Outpatient Encounter Medications as of 07/31/2023  Medication Sig   acetaminophen (TYLENOL) 325 MG tablet Take 650 mg by mouth every 6 (six) hours as needed for moderate pain (pain score 4-6).   Ascorbic Acid (VITAMIN C PO) Take 1 tablet by mouth at bedtime. (Patient not taking: Reported on 07/24/2023)   atorvastatin (LIPITOR) 80 MG tablet Take 80 mg by mouth daily.   buPROPion (WELLBUTRIN XL) 150 MG 24 hr tablet Take 150 mg by mouth daily.   carvedilol (COREG) 12.5 MG tablet Take  1 tablet (12.5 mg total) by mouth 2 (two) times daily.   Cholecalciferol (VITAMIN D-3) 125 MCG (5000 UT) TABS Take 1 tablet by mouth daily.   cloNIDine (CATAPRES) 0.1 MG tablet Take 0.1 mg by mouth at bedtime.   Continuous Glucose Receiver (FREESTYLE LIBRE 2 READER) DEVI As directed   Continuous Glucose Sensor (FREESTYLE LIBRE 2 SENSOR) MISC 1 Piece by Does not apply route every 14 (fourteen) days.   Ferrous Sulfate (IRON) 325 (65 Fe) MG TABS Take 1 tablet by mouth at bedtime.   folic acid (FOLVITE) 1 MG tablet Take 1 tablet (1 mg total) by mouth daily.   Insulin Glargine-yfgn (SEMGLEE, YFGN, [place]) Inject 30 Units into the skin at bedtime.   lamoTRIgine (LAMICTAL) 25 MG tablet Take 25 mg by mouth 2 (two) times daily.    levothyroxine (SYNTHROID) 150 MCG tablet Take 1 tablet (150 mcg total) by mouth daily before breakfast.   lisinopril (ZESTRIL) 5 MG tablet Take 1 tablet (5 mg total) by mouth daily.   LOKELMA 10 g PACK packet Take 1 packet by mouth daily.   metoCLOPramide (REGLAN) 10 MG tablet Take 1 tablet (10 mg total) by mouth every 6 (six) hours as needed for nausea or vomiting.   naloxone (NARCAN) nasal spray 4 mg/0.1 mL Place 1 spray into the nose once. (Patient not taking: Reported on 07/24/2023)   NOVOLOG FLEXPEN 100 UNIT/ML FlexPen Inject 10-16 Units into the skin 3 (three) times daily before meals.   nystatin (MYCOSTATIN/NYSTOP) powder Apply 1 Application topically 3 (three) times daily. Under breasts for redness (Patient not taking: Reported on 07/24/2023)   omega-3 acid ethyl esters (LOVAZA) 1 g capsule Take 1 capsule by mouth daily.   pantoprazole (PROTONIX) 40 MG tablet TAKE ONE (1) TABLET EACH DAY   saccharomyces boulardii (FLORASTOR) 250 MG capsule Take 250 mg by mouth 2 (two) times daily.   Thiamine HCl (VITAMIN B-1 PO) Take 1 tablet by mouth daily.   No facility-administered encounter medications on file as of 07/31/2023.   ALLERGIES: Allergies  Allergen Reactions   Sulfa Antibiotics Diarrhea, Nausea And Vomiting and Rash   Canagliflozin Diarrhea, Nausea And Vomiting and Other (See Comments)    Unknown   Clindamycin/Lincomycin Diarrhea and Nausea And Vomiting   Coenzyme Q10-Black Pepper    Latex     Rash, itching, over a long period of time   Metformin And Related Diarrhea and Nausea And Vomiting   Ozempic (0.25 Or 0.5 Mg-Dose) [Semaglutide(0.25 Or 0.5mg -Dos)] Diarrhea and Nausea And Vomiting   Polyurethane [Polyurethane]     Blistering and swelling   Tape Itching and Swelling    Reaction after a few days use/ please use paper tape   Cherry Itching, Rash, Swelling and Other (See Comments)    Throat swelling  Unknown    Throat swelling   Gabapentin Diarrhea, Nausea And Vomiting and  Rash    Unknown   Other Itching, Swelling and Rash    Reaction to Hot peppers (throat swelling)   Sulfamethoxazole-Trimethoprim Diarrhea, Nausea And Vomiting, Rash and Itching   VACCINATION STATUS: Immunization History  Administered Date(s) Administered   Influenza Split 08/15/2014   Influenza,inj,Quad PF,6+ Mos 06/10/2017, 04/30/2020   PNEUMOCOCCAL CONJUGATE-20 07/25/2023   Tdap 09/02/2014    Diabetes She presents for her follow-up diabetic visit. She has type 2 diabetes mellitus. Onset time: She was diagnosed at approximate age of 36 years. 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 There are no hypoglycemic associated symptoms. Pertinent negatives for hypoglycemia include no confusion, headaches, pallor or seizures. Pertinent negatives for diabetes include no chest pain, no fatigue, no polydipsia, no polyphagia and no polyuria. There are no hypoglycemic complications. Symptoms are improving. Diabetic complications include PVD and retinopathy. Pertinent negatives for diabetic complications include no autonomic neuropathy or peripheral neuropathy. (She has bilateral Charcot's feet, bilateral lower extremity lymphedema.) Risk factors for coronary artery disease include diabetes mellitus, dyslipidemia, hypertension, obesity and sedentary lifestyle. Current diabetic treatment includes intensive insulin program. She is compliant with treatment most of the time. Her weight is increasing steadily. She is following a generally unhealthy diet. When asked about meal planning, she reported none. She has not had a previous visit with a dietitian (She missed her appointment.). She never participates in exercise. Her home blood glucose trend is increasing steadily. Her breakfast blood glucose range is generally >200 mg/dl.  Her lunch blood glucose range is generally >200 mg/dl. Her dinner blood glucose range is generally >200 mg/dl. Her bedtime blood glucose range is generally >200 mg/dl. Her overall blood glucose range is >200 mg/dl. (She returns from her nursing home with her CGM device .  She reports that she was hospitalized in the interval when she was discharged on a much lower dose of insulin.  She presents with increasing previsit A1c of 9.5% from 8.6%.  Her AGP shows 7% time in range, 35% level 1 hyperglycemia, 58% level 2 hyperglycemia.  She did not document any hypoglycemia.  She is currently on Lantus 17 units nightly and NovoLog sliding scale 1 to 7 units/mL. ) An ACE inhibitor/angiotensin II receptor blocker is not being taken. Eye exam is current.  Hyperlipidemia This is a chronic problem. The current episode started more than 1 year ago. The problem is uncontrolled. Recent lipid tests were reviewed and are high. Exacerbating diseases include diabetes, hypothyroidism and obesity. Pertinent negatives include no chest pain, myalgias or shortness of breath. She is currently on no antihyperlipidemic treatment. Risk factors for coronary artery disease include dyslipidemia, diabetes mellitus, obesity, a sedentary lifestyle, post-menopausal and hypertension.  Thyroid Problem Presents for initial visit. Patient reports no cold intolerance, diarrhea, fatigue, heat intolerance or palpitations. Past treatments include levothyroxine. The following procedures have not been performed: radioiodine uptake scan and thyroidectomy. Her past medical history is significant for diabetes and hyperlipidemia.    Review of systems   Objective:    BP 110/60   Pulse 76   Wt Readings from Last 3 Encounters:  07/23/23 (!) 448 lb 3.1 oz (203.3 kg)  06/30/23 (!) 425 lb 7.8 oz (193 kg)  06/08/23 (!) 425 lb (192.8 kg)     Physical Exam- Limited  Constitutional: On recliner.  She is status post bilateral below-knee  amputation.   Psych: Reluctant affect.     Latest Ref Rng & Units 07/26/2023    4:43 AM 07/25/2023    5:45 AM 07/24/2023    4:36 AM  CMP  Glucose 70 - 99 mg/dL 782  956  213   BUN 6 - 20 mg/dL 24  15  11    Creatinine 0.44 - 1.00 mg/dL 0.86  5.78  4.69   Sodium 135 - 145 mmol/L 137  134  135   Potassium 3.5 - 5.1 mmol/L 3.8  3.4  3.3   Chloride 98 - 111 mmol/L 100  100  100   CO2 22 - 32  mmol/L 26  26  26    Calcium 8.9 - 10.3 mg/dL 7.7  7.7  7.5       Diabetic Labs (most recent): Lab Results  Component Value Date   HGBA1C 9.5 (H) 05/13/2023   HGBA1C 8.6 (A) 03/27/2023   HGBA1C 9.5 (A) 12/25/2022   MICROALBUR 114.5 10/20/2019   MICROALBUR 126.9 11/18/2018   MICROALBUR 2.0 10/08/2017    Lipid Panel     Component Value Date/Time   CHOL 152 03/07/2022 0000   TRIG 163 (A) 03/07/2022 0000   HDL 36 03/07/2022 0000   CHOLHDL 4.5 07/03/2020 1001   VLDL 42 (H) 04/25/2015 1001   LDLCALC 83 03/07/2022 0000   LDLCALC 115 (H) 07/03/2020 1001    Assessment & Plan:   1. Uncontrolled type 2 diabetes mellitus with complication, with long-term current use of insulin (HCC)   Her diabetes is complicated by bilateral peripheral neuropathy , lateral Charcot's feet status post bilateral below-knee amputation due to complicated cellulitis/osteomyelitis, history of noncompliance/nonadherence, and possible retinopathy.  - She has chronically uncontrolled symptomatic type 2 DM of approximately since age 45 years duration.  She returns from her nursing home with her CGM device .  She reports that she was hospitalized in the interval for hypertensive urgency when she was discharged on a much lower dose of insulin.  She presents with increasing previsit A1c of 9.5% from 8.6%.  Her AGP shows 7% time in range, 35% level 1 hyperglycemia, 58% level 2 hyperglycemia.  She did not document any hypoglycemia.  She is currently on Lantus 17 units nightly and NovoLog sliding scale 1 to 7 units/mL.  -She  remains at extremely high risk for more acute and chronic complications of diabetes which include CAD, CVA, CKD, retinopathy, and neuropathy. These are all discussed in detail with the patient.   - I have counseled the patient on diet management and weight loss, by adopting a carbohydrate restricted/protein rich diet.  - Patient is advised to stick to a routine mealtimes to eat 3 meals  a day and avoid unnecessary snacks ( to snack only to correct hypoglycemia).   - she acknowledges that there is a room for improvement in her food and drink choices. - Suggestion is made for her to avoid simple carbohydrates  from her diet including Cakes, Sweet Desserts, Ice Cream, Soda (diet and regular), Sweet Tea, Candies, Chips, Cookies, Store Bought Juices, Alcohol , Artificial Sweeteners,  Coffee Creamer, and "Sugar-free" Products, Lemonade. This will help patient to have more stable blood glucose profile and potentially avoid unintended weight gain.   - I have approached patient with the following individualized plan to manage diabetes and patient agrees:   -In light of her presentation with significantly above target glycemic profile, mainly due to significant reduction in her usual insulin doses, she will be resumed on a higher dose of basal/bolus insulin.    She is advised to increase her Lantus to 30 units nightly, increase her NovoLog to 10-16 units 3 times daily AC for Premeal blood glucose above 90 mg per per DL .  -she does not tolerate GLP-1 receptor agonist and she is not a candidate for SGLT2 inhibitors.   - She is warned not to take insulin without monitoring blood glucose properly.   -Patient is encouraged to call clinic for blood glucose levels less than 70 or above 200 mg per DL weekly average.     -She did not tolerate previous attempt on SGLT2 inhibitors nor  GLP-1 receptor  agonist.  - Patient specific target  A1c;  LDL, HDL, Triglycerides were discussed in detail.  2) BP/HTN:  Her  blood pressure is controlled to target.   She is advised to take her blood pressure medications in the morning .  Advised to continue lisinopril 20 mg daily, carvedilol 25 mg p.o. twice daily, advised on salt restrictions.     3) Lipids/HPL: Most recent lipid panel showed LDL improving to 83.  She is advised to continue [place] 40 mg p.o. nightly.     Side effects and precautions discussed with her.    4) vitamin D deficiency:  -She is advised to continue vitamin D2 50,000 units weekly in addition to her daily vitamin D3 5000 units.      5) morbid obesity-   She is morbidly obese, sedentary, a candidate for modest weight loss.  She cannot exercise optimally.  Whole Foods plant-based diet will help her achieve reasonable weight loss.  6) hypothyroidism:  -She does not have recent thyroid function tests.  She is advised to continue levothyroxine 150 mcg p.o. daily before breakfast.     - We discussed about the correct intake of her thyroid hormone, on empty stomach at fasting, with water, separated by at least 30 minutes from breakfast and other medications,  and separated by more than 4 hours from calcium, iron, multivitamins, acid reflux medications (PPIs). -Patient is made aware of the fact that thyroid hormone replacement is needed for life, dose to be adjusted by periodic monitoring of thyroid function tests.   6) Chronic Care/Health Maintenance:  -Patient is advised to continue lisinopril, Lipitor. She is encouraged to continue to follow up with Ophthalmology, Podiatrist at least yearly or according to recommendations, and advised to stay away from smoking. I have recommended yearly flu vaccine and pneumonia vaccination at least every 5 years; moderate intensity exercise for up to 150 minutes weekly; and  sleep for at least 7 hours a day.   I advised patient to maintain close follow up with their PCP for primary care needs.   I spent  41 minutes in the care of the patient today  including review of labs from CMP, Lipids, Thyroid Function, Hematology (current and previous including abstractions from other facilities); face-to-face time discussing  her blood glucose readings/logs, discussing hypoglycemia and hyperglycemia episodes and symptoms, medications doses, her options of short and long term treatment based on the latest standards of care / guidelines;  discussion about incorporating lifestyle medicine;  and documenting the encounter. Risk reduction counseling performed per USPSTF guidelines to reduce obesity and cardiovascular risk factors.     Please refer to Patient Instructions for Blood Glucose Monitoring and Insulin/Medications Dosing Guide"  in media tab for additional information. Please  also refer to " Patient Self Inventory" in the Media  tab for reviewed elements of pertinent patient history.  Michelle Skinner participated in the discussions, expressed understanding, and voiced agreement with the above plans.  All questions were answered to her satisfaction. she is encouraged to contact clinic should she have any questions or concerns prior to her return visit.   Follow up plan: Return in about 3 months (around 10/29/2023) for Bring Meter/CGM Device/Logs- A1c in Office.  Marquis Lunch, MD Phone: [phone]  Fax: [phone]  -  This note was partially dictated with voice recognition software. Similar sounding words can be transcribed inadequately or may not  be corrected upon review.  07/31/2023, 3:20 PM

## 2023-11-13 ENCOUNTER — Ambulatory Visit: Payer: 59 | Admitting: "Endocrinology

## 2023-12-19 ENCOUNTER — Emergency Department (HOSPITAL_COMMUNITY)

## 2023-12-19 ENCOUNTER — Emergency Department (HOSPITAL_COMMUNITY): Admission: EM | Admit: 2023-12-19 | Discharge: 2023-12-19 | Disposition: A | Discharge status: Rehabilitation Facility

## 2023-12-19 ENCOUNTER — Other Ambulatory Visit: Payer: Self-pay

## 2023-12-19 ENCOUNTER — Encounter (HOSPITAL_COMMUNITY): Payer: Self-pay

## 2023-12-19 DIAGNOSIS — Z794 Long term (current) use of insulin: Secondary | ICD-10-CM | POA: Diagnosis not present

## 2023-12-19 DIAGNOSIS — Z91040 Latex allergy status: Secondary | ICD-10-CM | POA: Insufficient documentation

## 2023-12-19 DIAGNOSIS — Z8541 Personal history of malignant neoplasm of cervix uteri: Secondary | ICD-10-CM | POA: Diagnosis not present

## 2023-12-19 DIAGNOSIS — E119 Type 2 diabetes mellitus without complications: Secondary | ICD-10-CM | POA: Insufficient documentation

## 2023-12-19 DIAGNOSIS — M542 Cervicalgia: Secondary | ICD-10-CM | POA: Diagnosis not present

## 2023-12-19 DIAGNOSIS — Y92410 Unspecified street and highway as the place of occurrence of the external cause: Secondary | ICD-10-CM | POA: Diagnosis not present

## 2023-12-19 DIAGNOSIS — M25512 Pain in left shoulder: Secondary | ICD-10-CM | POA: Insufficient documentation

## 2023-12-19 DIAGNOSIS — M5020 Other cervical disc displacement, unspecified cervical region: Secondary | ICD-10-CM

## 2023-12-19 MED ORDER — LORAZEPAM 1 MG PO TABS
1.0000 mg | ORAL_TABLET | Freq: Once | ORAL | Status: AC
Start: 2023-12-19 — End: 2023-12-19
  Administered 2023-12-19: 1 mg via ORAL
  Filled 2023-12-19: qty 1

## 2023-12-19 MED ORDER — ACETAMINOPHEN 500 MG PO TABS
1000.0000 mg | ORAL_TABLET | Freq: Once | ORAL | Status: AC
  Administered 2023-12-19: 1000 mg via ORAL
  Filled 2023-12-19: qty 2

## 2023-12-19 MED ORDER — METHOCARBAMOL 500 MG PO TABS: 500.0000 mg | ORAL_TABLET | Freq: Two times a day (BID) | ORAL | 0 refills | Status: AC

## 2023-12-19 MED ORDER — DEXAMETHASONE 4 MG PO TABS
6.0000 mg | ORAL_TABLET | Freq: Once | ORAL | Status: AC
Start: 2023-12-19 — End: 2023-12-19
  Administered 2023-12-19: 6 mg via ORAL
  Filled 2023-12-19: qty 2

## 2023-12-19 MED ORDER — METHOCARBAMOL 500 MG PO TABS
500.0000 mg | ORAL_TABLET | Freq: Once | ORAL | Status: AC
  Administered 2023-12-19: 500 mg via ORAL
  Filled 2023-12-19: qty 1

## 2023-12-19 MED ORDER — LIDOCAINE 5 % EX PTCH: 1.0000 | MEDICATED_PATCH | CUTANEOUS | 0 refills | Status: DC

## 2023-12-19 NOTE — ED Notes (Signed)
[place] J collar applied 

## 2023-12-19 NOTE — ED Notes (Signed)
 Pt transported to MRI

## 2023-12-19 NOTE — Progress Notes (Signed)
 Orthopedic Tech Progress Note Patient Details:  Michelle Skinner [birth year] [id number]  Ortho Devices Type of Ortho Device: Sling immobilizer Ortho Device/Splint Location: LUE Ortho Device/Splint Interventions: Ordered, Application, Adjustment   Post Interventions Patient Tolerated: Well Instructions Provided: Adjustment of device  Herbie Loll 12/19/2023, 9:00 PM
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
## 2023-12-19 NOTE — Discharge Instructions (Addendum)
 It was a pleasure to take care of you here today.  As discussed you have a disc herniation in your neck this is unclear if this is old or new.  I have written you for some muscle relaxers.  You are given a dose of steroids which would work for the next few days.  Make sure to follow-up outpatient, return for any worsening symptoms

## 2023-12-19 NOTE — ED Notes (Signed)
[facility] called no ETA
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
## 2024-05-11 ENCOUNTER — Emergency Department (HOSPITAL_COMMUNITY)

## 2024-05-11 ENCOUNTER — Emergency Department (HOSPITAL_COMMUNITY)
Admission: EM | Admit: 2024-05-11 | Discharge: 2024-05-14 | Disposition: A | Source: Skilled Nursing Facility | Attending: Emergency Medicine | Admitting: Emergency Medicine

## 2024-05-11 ENCOUNTER — Other Ambulatory Visit: Payer: Self-pay

## 2024-05-11 DIAGNOSIS — Z79899 Other long term (current) drug therapy: Secondary | ICD-10-CM | POA: Diagnosis not present

## 2024-05-11 DIAGNOSIS — I10 Essential (primary) hypertension: Secondary | ICD-10-CM | POA: Diagnosis not present

## 2024-05-11 DIAGNOSIS — E119 Type 2 diabetes mellitus without complications: Secondary | ICD-10-CM | POA: Insufficient documentation

## 2024-05-11 DIAGNOSIS — Z794 Long term (current) use of insulin: Secondary | ICD-10-CM | POA: Insufficient documentation

## 2024-05-11 DIAGNOSIS — Z8541 Personal history of malignant neoplasm of cervix uteri: Secondary | ICD-10-CM | POA: Insufficient documentation

## 2024-05-11 DIAGNOSIS — R45851 Suicidal ideations: Secondary | ICD-10-CM | POA: Diagnosis not present

## 2024-05-11 DIAGNOSIS — F32A Depression, unspecified: Secondary | ICD-10-CM

## 2024-05-11 DIAGNOSIS — F329 Major depressive disorder, single episode, unspecified: Secondary | ICD-10-CM | POA: Diagnosis not present

## 2024-05-11 DIAGNOSIS — Z91040 Latex allergy status: Secondary | ICD-10-CM | POA: Insufficient documentation

## 2024-05-11 DIAGNOSIS — R4689 Other symptoms and signs involving appearance and behavior: Secondary | ICD-10-CM | POA: Diagnosis present

## 2024-05-11 LAB — COMPREHENSIVE METABOLIC PANEL WITH GFR
ALT: 18 U/L (ref 0–44)
AST: 19 U/L (ref 15–41)
Albumin: 3.5 g/dL (ref 3.5–5.0)
Alkaline Phosphatase: 157 U/L — ABNORMAL HIGH (ref 38–126)
Anion gap: 10 (ref 5–15)
BUN: 41 mg/dL — ABNORMAL HIGH (ref 6–20)
CO2: 29 mmol/L (ref 22–32)
Calcium: 9.1 mg/dL (ref 8.9–10.3)
Chloride: 103 mmol/L (ref 98–111)
Creatinine, Ser: 2.13 mg/dL — ABNORMAL HIGH (ref 0.44–1.00)
GFR, Estimated: 27 mL/min — ABNORMAL LOW (ref >60)
Glucose, Bld: 193 mg/dL — ABNORMAL HIGH (ref 70–99)
Potassium: 4.6 mmol/L (ref 3.5–5.1)
Sodium: 142 mmol/L (ref 135–145)
Total Bilirubin: 0.4 mg/dL (ref 0.0–1.2)
Total Protein: 7.1 g/dL (ref 6.5–8.1)

## 2024-05-11 LAB — CBC WITH DIFFERENTIAL/PLATELET
Abs Immature Granulocytes: 0.01 K/uL (ref 0.00–0.07)
Basophils Absolute: 0 K/uL (ref 0.0–0.1)
Basophils Relative: 1 %
Eosinophils Absolute: 0.1 K/uL (ref 0.0–0.5)
Eosinophils Relative: 1 %
HCT: 38.2 % (ref 36.0–46.0)
Hemoglobin: 12.3 g/dL (ref 12.0–15.0)
Immature Granulocytes: 0 %
Lymphocytes Relative: 25 %
Lymphs Abs: 1.6 K/uL (ref 0.7–4.0)
MCH: 29.9 pg (ref 26.0–34.0)
MCHC: 32.2 g/dL (ref 30.0–36.0)
MCV: 92.7 fL (ref 80.0–100.0)
Monocytes Absolute: 0.5 K/uL (ref 0.1–1.0)
Monocytes Relative: 7 %
Neutro Abs: 4 K/uL (ref 1.7–7.7)
Neutrophils Relative %: 66 %
Platelets: 264 K/uL (ref 150–400)
RBC: 4.12 MIL/uL (ref 3.87–5.11)
RDW: 12.5 % (ref 11.5–15.5)
WBC: 6.1 K/uL (ref 4.0–10.5)
nRBC: 0 % (ref 0.0–0.2)

## 2024-05-11 LAB — CBG MONITORING, ED
Glucose-Capillary: 190 mg/dL — ABNORMAL HIGH (ref 70–99)
Glucose-Capillary: 240 mg/dL — ABNORMAL HIGH (ref 70–99)

## 2024-05-11 LAB — MAGNESIUM: Magnesium: 2.1 mg/dL (ref 1.7–2.4)

## 2024-05-11 LAB — ETHANOL: Alcohol, Ethyl (B): <15 mg/dL (ref <15)

## 2024-05-11 MED ORDER — CARVEDILOL 12.5 MG PO TABS
12.5000 mg | ORAL_TABLET | Freq: Two times a day (BID) | ORAL | Status: DC
  Administered 2024-05-11 – 2024-05-14 (×5): 12.5 mg via ORAL
  Filled 2024-05-11: qty 4
  Filled 2024-05-11: qty 1
  Filled 2024-05-11: qty 4
  Filled 2024-05-11: qty 1
  Filled 2024-05-11: qty 4

## 2024-05-11 MED ORDER — CLONIDINE HCL 0.1 MG PO TABS
0.1000 mg | ORAL_TABLET | Freq: Every day | ORAL | Status: DC
  Administered 2024-05-11 – 2024-05-14 (×3): 0.1 mg via ORAL
  Filled 2024-05-11 (×3): qty 1

## 2024-05-11 MED ORDER — METHOCARBAMOL 500 MG PO TABS
500.0000 mg | ORAL_TABLET | Freq: Two times a day (BID) | ORAL | Status: DC
  Administered 2024-05-11 – 2024-05-14 (×5): 500 mg via ORAL
  Filled 2024-05-11 (×5): qty 1

## 2024-05-11 MED ORDER — INSULIN GLARGINE-YFGN 100 UNIT/ML ~~LOC~~ SOLN
30.0000 [IU] | Freq: Every day | SUBCUTANEOUS | Status: DC
  Administered 2024-05-11 – 2024-05-14 (×3): 30 [IU] via SUBCUTANEOUS
  Filled 2024-05-11 (×3): qty 0.3

## 2024-05-11 MED ORDER — ACETAMINOPHEN 325 MG PO TABS
650.0000 mg | ORAL_TABLET | Freq: Four times a day (QID) | ORAL | Status: DC | PRN
Start: 2024-05-11 — End: 2024-05-14
  Administered 2024-05-11 – 2024-05-12 (×2): 650 mg via ORAL
  Filled 2024-05-11 (×3): qty 2

## 2024-05-11 MED ORDER — LEVOTHYROXINE SODIUM 50 MCG PO TABS
175.0000 ug | ORAL_TABLET | Freq: Every day | ORAL | Status: DC
  Administered 2024-05-11 – 2024-05-13 (×3): 175 ug via ORAL
  Filled 2024-05-11 (×3): qty 4

## 2024-05-11 MED ORDER — ATORVASTATIN CALCIUM 40 MG PO TABS
80.0000 mg | ORAL_TABLET | Freq: Every day | ORAL | Status: DC
  Administered 2024-05-11 – 2024-05-13 (×3): 80 mg via ORAL
  Filled 2024-05-11 (×3): qty 2

## 2024-05-11 MED ORDER — INSULIN ASPART 100 UNIT/ML FLEXPEN
10.0000 [IU] | PEN_INJECTOR | Freq: Three times a day (TID) | SUBCUTANEOUS | Status: DC
  Filled 2024-05-11: qty 3

## 2024-05-11 MED ORDER — PANTOPRAZOLE SODIUM 40 MG PO TBEC
40.0000 mg | DELAYED_RELEASE_TABLET | Freq: Every day | ORAL | Status: DC
  Administered 2024-05-11 – 2024-05-13 (×3): 40 mg via ORAL
  Filled 2024-05-11 (×3): qty 1

## 2024-05-11 MED ORDER — PREGABALIN 50 MG PO CAPS
50.0000 mg | ORAL_CAPSULE | Freq: Every day | ORAL | Status: DC
  Administered 2024-05-11 – 2024-05-13 (×3): 50 mg via ORAL
  Filled 2024-05-11 (×3): qty 1

## 2024-05-11 MED ORDER — LISINOPRIL 10 MG PO TABS
10.0000 mg | ORAL_TABLET | Freq: Every day | ORAL | Status: DC
  Administered 2024-05-11 – 2024-05-13 (×3): 10 mg via ORAL
  Filled 2024-05-11 (×3): qty 1

## 2024-05-11 MED ORDER — LAMOTRIGINE 25 MG PO TABS
25.0000 mg | ORAL_TABLET | Freq: Two times a day (BID) | ORAL | Status: DC
  Administered 2024-05-11 – 2024-05-14 (×5): 25 mg via ORAL
  Filled 2024-05-11 (×5): qty 1

## 2024-05-11 MED ORDER — TORSEMIDE 20 MG PO TABS
20.0000 mg | ORAL_TABLET | Freq: Two times a day (BID) | ORAL | Status: DC
  Administered 2024-05-11 – 2024-05-14 (×5): 20 mg via ORAL
  Filled 2024-05-11 (×5): qty 1

## 2024-05-11 MED ORDER — BUPROPION HCL ER (XL) 150 MG PO TB24
300.0000 mg | ORAL_TABLET | Freq: Every day | ORAL | Status: DC
  Administered 2024-05-11 – 2024-05-13 (×3): 300 mg via ORAL
  Filled 2024-05-11 (×3): qty 2
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
## 2024-05-11 NOTE — ED Triage Notes (Signed)
 Pt arrived via [facility] from [facility] [place] c/o wanting to kill herself by cutting her wrists. Pt is non-ambulatory with bilateral BKAs and Hx of T2D

## 2024-05-11 NOTE — Progress Notes (Signed)
 Inpatient Psychiatric Referral  Patient was recommended inpatient per Erla Rase, MD . There are no available beds at [facility], per [facility]. Patient was referred to the following out of network facilities:  Destination  Service Provider Address Phone Fax  [facility]  [address], [place] [address] [phone]  [facility] [address]  [address] [place] [address] [phone]  [facility]  [address] [place] [address] [phone]  [facility]  [address] [place], [place] [address] [phone]  [facility]  [address] [place] [address] [phone]  [facility]  [address] [place] [address] [phone]  [facility]  [address], [place] [address] [phone]  [facility]  [address] [place] [address] [phone]  [facility]  [address] [place], [place] [phone]  [facility]  [address] [place] [address] [phone]  [facility]  [address] [place] [address] [phone]    Situation ongoing, CSW to continue following and update chart as more information becomes available.   Harrie Sofia MSW, LCSWA 05/11/2024

## 2024-05-11 NOTE — ED Provider Notes (Signed)
 [facility] [place] EMERGENCY DEPARTMENT AT [facility] Provider Note   CSN: [id number] Arrival date & time: 05/11/24  1344     Patient presents with: V70.1   Michelle Skinner is a 58 y.o. female.   HPI Patient presents for suicidal ideation.  Medical history includes HTN, DM, HLD, cervical cancer, anxiety, depression, GERD.  She arrives from nursing facility for eliciting suicidal ideation with plan to cut her wrists.  At baseline, she is nonambulatory with bilateral BKA's.  She has been in her nursing facility for the past 3 years.  She states that she has been feeling suicidal over the past week.  She attributes this to stressors involving care at her nursing facility.  She feels like the staff there are unkind to her.  This week, they accused her of having bedbugs even though she denies this.  They felt that her room was messy and told her to get a storage unit.  Patient states that she was treated for pneumonia 2 weeks ago.  She has had improvement in strength since that time.  She denies any new recent physical symptoms.    Prior to Admission medications   Medication Sig Start Date End Date Taking? Authorizing Provider  acetaminophen  (TYLENOL ) 325 MG tablet Take 650 mg by mouth every 6 (six) hours as needed for moderate pain (pain score 4-6).    [provider]  Ascorbic Acid (VITAMIN C PO) Take 1 tablet by mouth at bedtime. Patient not taking: Reported on 07/24/2023    [provider]  atorvastatin  (LIPITOR) 80 MG tablet Take 80 mg by mouth daily. 04/11/23   [provider]  buPROPion  (WELLBUTRIN  XL) 300 MG 24 hr tablet Take 300 mg by mouth daily. 04/07/24   [provider]  carvedilol  (COREG ) 12.5 MG tablet Take 1 tablet (12.5 mg total) by mouth 2 (two) times daily. 09/17/20 07/24/23  Gonfa, Taye T, MD  Cholecalciferol  (VITAMIN D -3) 125 MCG (5000 UT) TABS Take 1 tablet by mouth daily.    [provider]  cloNIDine  (CATAPRES ) 0.1 MG tablet Take  0.1 mg by mouth at bedtime.    [provider]  Continuous Glucose Receiver (FREESTYLE LIBRE 2 READER) DEVI As directed 04/29/23   [name], Gebreselassie W, MD  Continuous Glucose Sensor (FREESTYLE LIBRE 2 SENSOR) MISC 1 Piece by Does not apply route every 14 (fourteen) days. 12/25/22   [name], Gebreselassie W, MD  ezetimibe (ZETIA) 10 MG tablet Take 10 mg by mouth daily. 04/28/24   [provider]  Ferrous Sulfate (IRON) 325 (65 Fe) MG TABS Take 1 tablet by mouth at bedtime.    [provider]  folic acid  (FOLVITE ) 1 MG tablet Take 1 tablet (1 mg total) by mouth daily. 09/17/20   Gonfa, Taye T, MD  HUMALOG  KWIKPEN 100 UNIT/ML KwikPen Inject 5 Units into the skin 3 (three) times daily. 04/18/24   [provider]  Insulin  Glargine-yfgn (SEMGLEE , YFGN, [place]) Inject 30 Units into the skin at bedtime.    [provider]  lamoTRIgine  (LAMICTAL ) 25 MG tablet Take 25 mg by mouth 2 (two) times daily.    [provider]  LANTUS  SOLOSTAR 100 UNIT/ML Solostar Pen Inject 48 Units into the skin daily. 04/17/24   [provider]  levothyroxine  (SYNTHROID ) 175 MCG tablet Take 175 mcg by mouth daily. 04/25/24   [provider]  lidocaine  (LIDODERM ) 5 % Place 1 patch onto the skin daily. Remove & Discard patch within 12 hours or as  directed by MD 12/19/23   Henderly, Britni A, PA-C  lisinopril  (ZESTRIL ) 10 MG tablet Take 10 mg by mouth daily. 03/30/24   [provider]  LOKELMA  10 g PACK packet Take 1 packet by mouth daily. 07/10/23   [provider]  loperamide (IMODIUM) 2 MG capsule Take 2 mg by mouth as needed for diarrhea or loose stools. 04/08/24   [provider]  methocarbamol  (ROBAXIN ) 500 MG tablet Take 1 tablet (500 mg total) by mouth 2 (two) times daily. 12/19/23   Henderly, Britni A, PA-C  metoCLOPramide  (REGLAN ) 5 MG tablet Take 5 mg by mouth 3 (three) times daily. 12/14/23   [provider]  naloxone [facility])  nasal spray 4 mg/0.1 mL Place 1 spray into the nose once. Patient not taking: Reported on 07/24/2023    [provider]  NOVOLOG  FLEXPEN 100 UNIT/ML FlexPen Inject 10-16 Units into the skin 3 (three) times daily before meals. 05/08/23   [provider]  nystatin  (MYCOSTATIN /NYSTOP ) powder Apply 1 Application topically 3 (three) times daily. Under breasts for redness Patient not taking: Reported on 07/24/2023 03/13/23   [provider]  nystatin  cream (MYCOSTATIN ) Apply 1 Application topically 3 (three) times daily. 01/30/24   [provider]  omega-3 acid ethyl esters (LOVAZA) 1 g capsule Take 1 capsule by mouth daily.    [provider]  ondansetron  (ZOFRAN ) 4 MG tablet Take 4 mg by mouth every 8 (eight) hours as needed. 03/24/24   [provider]  pantoprazole  (PROTONIX ) 40 MG tablet TAKE ONE (1) TABLET EACH DAY 12/01/18   [name], Gebreselassie W, MD  pregabalin  (LYRICA ) 50 MG capsule Take 50 mg by mouth daily.    [provider]  saccharomyces boulardii (FLORASTOR) 250 MG capsule Take 250 mg by mouth 2 (two) times daily.    [provider]  Thiamine HCl (VITAMIN B-1 PO) Take 1 tablet by mouth daily.    [provider]  torsemide (DEMADEX) 20 MG tablet Take 20 mg by mouth 2 (two) times daily. 04/27/24   [provider]  triamcinolone cream (KENALOG) 0.1 % Apply 1 Application topically 2 (two) times daily. 05/06/24   [provider]    Allergies: Cherry, Other, Canagliflozin, Clindamycin/lincomycin, Coenzyme q10-black pepper, Metformin and related, Ozempic  (0.25 or 0.5 mg-dose) [semaglutide (0.25 or 0.5mg -dos)], Sulfa antibiotics, Gabapentin , Latex, Polyurethane [polyurethane], Sulfamethoxazole-trimethoprim, and Tape    Review of Systems  Psychiatric/Behavioral:  Positive for suicidal ideas.   All other systems reviewed and are negative.   Updated Vital Signs BP (!) 178/70   Pulse 61   Temp 97.9 F  (36.6 C) (Oral)   Resp 20   SpO2 98%   Physical Exam Vitals and nursing note reviewed.  Constitutional:      General: She is not in acute distress.    Appearance: Normal appearance. She is well-developed. She is not ill-appearing, toxic-appearing or diaphoretic.  HENT:     Head: Normocephalic and atraumatic.     Right Ear: External ear normal.     Left Ear: External ear normal.     Nose: Nose normal.     Mouth/Throat:     Mouth: Mucous membranes are moist.  Eyes:     Extraocular Movements: Extraocular movements intact.     Conjunctiva/sclera: Conjunctivae normal.  Cardiovascular:     Rate and Rhythm: Normal rate and regular rhythm.  Pulmonary:     Effort: Pulmonary effort is normal. No respiratory distress.  Abdominal:     General: There  is no distension.     Palpations: Abdomen is soft.  Musculoskeletal:        General: No swelling. Normal range of motion.     Cervical back: Normal range of motion and neck supple.  Skin:    General: Skin is warm and dry.     Coloration: Skin is not jaundiced or pale.  Neurological:     General: No focal deficit present.     Mental Status: She is alert and oriented to person, place, and time.     Cranial Nerves: No cranial nerve deficit.     Sensory: No sensory deficit.     Motor: No weakness.     Coordination: Coordination normal.  Psychiatric:        Mood and Affect: Mood and affect normal.        Speech: Speech normal.        Behavior: Behavior normal. Behavior is cooperative.        Thought Content: Thought content includes suicidal ideation. Thought content includes suicidal plan.     (all labs ordered are listed, but only abnormal results are displayed) Labs Reviewed  COMPREHENSIVE METABOLIC PANEL WITH GFR - Abnormal; Notable for the following components:      Result Value   Glucose, Bld 193 (*)    BUN 41 (*)    Creatinine, Ser 2.13 (*)    Alkaline Phosphatase 157 (*)    GFR, Estimated 27 (*)    All other components  within normal limits  CBG MONITORING, ED - Abnormal; Notable for the following components:   Glucose-Capillary 190 (*)    All other components within normal limits  ETHANOL  CBC WITH DIFFERENTIAL/PLATELET  MAGNESIUM   URINE DRUG SCREEN  URINALYSIS, ROUTINE W REFLEX MICROSCOPIC    EKG: EKG Interpretation Date/Time:  Tuesday May 11 2024 16:31:13 EST Ventricular Rate:  65 PR Interval:  200 QRS Duration:  116 QT Interval:  430 QTC Calculation: 447 R Axis:   -67  Text Interpretation: Normal sinus rhythm Low voltage QRS Left anterior fascicular block Cannot rule out Anterior infarct , age undetermined Abnormal ECG Confirmed by Melvenia Motto [phone]) on 05/11/2024 5:28:05 PM  Radiology: DG Chest 2 View Result Date: 05/11/2024 EXAM: 2 VIEW(S) XRAY OF THE CHEST 05/11/2024 04:12:02 PM COMPARISON: Chest x-ray 06/06/1999. CLINICAL HISTORY: Recent pneumonia FINDINGS: LUNGS AND PLEURA: Lung volumes are low. No focal pulmonary opacity. No pulmonary edema. No pleural effusion. No pneumothorax. HEART AND MEDIASTINUM: Heart is mildly enlarged. BONES AND SOFT TISSUES: No acute osseous abnormality. IMPRESSION: 1. No acute process. 2. Mildly enlarged heart. Electronically signed by: Greig Pique MD 05/11/2024 04:37 PM EST RP Workstation: [id number]     Procedures   Medications Ordered in the ED  acetaminophen  (TYLENOL ) tablet 650 mg (has no administration in time range)  atorvastatin  (LIPITOR) tablet 80 mg (has no administration in time range)  buPROPion  (WELLBUTRIN  XL) 24 hr tablet 300 mg (has no administration in time range)  carvedilol  (COREG ) tablet 12.5 mg (has no administration in time range)  cloNIDine  (CATAPRES ) tablet 0.1 mg (has no administration in time range)  insulin  glargine-yfgn (SEMGLEE ) injection 30 Units (has no administration in time range)  lamoTRIgine  (LAMICTAL ) tablet 25 mg (has no administration in time range)  levothyroxine  (SYNTHROID ) tablet 175 mcg (has no administration in  time range)  lisinopril  (ZESTRIL ) tablet 10 mg (has no administration in time range)  methocarbamol  (ROBAXIN ) tablet 500 mg (has no administration in time range)  insulin  aspart (NOVOLOG ) FlexPen 10-16 Units (  has no administration in time range)  pantoprazole  (PROTONIX ) EC tablet 40 mg (has no administration in time range)  pregabalin  (LYRICA ) capsule 50 mg (has no administration in time range)  torsemide (DEMADEX) tablet 20 mg (has no administration in time range)                                    Medical Decision Making Amount and/or Complexity of Data Reviewed Labs: ordered. Radiology: ordered.  Risk OTC drugs. Prescription drug management.   Patient presenting for suicidal ideation with plan to cut her wrists.  She attributes this to stressors at her nursing facility.  On arrival in the ED, patient is well-appearing on exam.  Current breathing is unlabored.  She does report that she recently recovered from pneumonia which she attributes to a recent influenza vaccine.  Patient denies any recent physical symptoms of concern.  Medical clearance workup was initiated.  Patient's lab work shows baseline creatinine, normal electrolytes, normal hemoglobin, no leukocytosis.  X-ray does not show any evidence of recurrent pneumonia.  Patient is medically clear.  All medications and diet ordered.  Patient to remain in the ED pending TTS evaluation.     Final diagnoses:  Suicidal ideation    ED Discharge Orders     None          Melvenia Motto, MD 05/11/24 1805

## 2024-05-12 DIAGNOSIS — F329 Major depressive disorder, single episode, unspecified: Secondary | ICD-10-CM | POA: Diagnosis not present

## 2024-05-12 LAB — CBG MONITORING, ED
Glucose-Capillary: 220 mg/dL — ABNORMAL HIGH (ref 70–99)
Glucose-Capillary: 266 mg/dL — ABNORMAL HIGH (ref 70–99)
Glucose-Capillary: 263 mg/dL — ABNORMAL HIGH (ref 70–99)
Glucose-Capillary: 274 mg/dL — ABNORMAL HIGH (ref 70–99)

## 2024-05-12 MED ORDER — INSULIN ASPART 100 UNIT/ML IJ SOLN
11.0000 [IU] | Freq: Three times a day (TID) | INTRAMUSCULAR | Status: DC
  Administered 2024-05-12: 12 [IU] via SUBCUTANEOUS
  Administered 2024-05-12 (×2): 13 [IU] via SUBCUTANEOUS
  Administered 2024-05-13 (×2): 12 [IU] via SUBCUTANEOUS
  Filled 2024-05-12 (×5): qty 1

## 2024-05-12 NOTE — Progress Notes (Signed)
 Inpatient Psychiatric Referral  Patient was recommended inpatient per Erla Rase (NP) . There are no available beds at [facility], per [facility] . Patient was referred to the following out of network facilities:  Destination  Service Provider Address Phone Fax  [facility]  [address], [place] [address] [phone]  [facility] [address]  [address], [place] [address] [phone]  [facility]-Geriatric  [address] [place] [address] [phone]  [facility]  [address] [place], [place] [address] [phone]  [facility]  [address] [place] [address] [phone]  [facility]  [address] [place] [address] [phone]  [facility]  [address] [place] [address] [phone]  [facility]  [address], [place] [address] [phone]  [facility] EFAX  [address] [place], [place] [phone]  [facility]  [address] [place] [address] [phone]  [facility]  [address], [place] [address] [phone]  [facility] Patient Placement  [facility], [place] [phone]  [facility]  [place] [phone]  [facility] Inpatient EFAX  [place] [phone]    Situation ongoing, CSW to continue following and update chart as more information becomes available.   Harrie Sofia MSW, LCSWA 05/12/2024

## 2024-05-12 NOTE — ED Provider Notes (Signed)
 Emergency Medicine Observation Re-evaluation Note  Michelle Skinner is a 58 y.o. female, seen on rounds today.  Pt initially presented to the ED for complaints of V70.1 Currently, the patient is resting.  Physical Exam  BP (!) 158/53 (BP Location: Left Wrist)   Pulse 65   Temp 97.8 F (36.6 C) (Oral)   Resp 18   SpO2 96%  Physical Exam General: Adult female no distress, resting comfortably Cardiac: Regular rate and rhythm Lungs: No increased work of breathing Psych: Calm  ED Course / MDM  EKG:EKG Interpretation Date/Time:  Tuesday May 11 2024 16:31:13 EST Ventricular Rate:  65 PR Interval:  200 QRS Duration:  116 QT Interval:  430 QTC Calculation: 447 R Axis:   -67  Text Interpretation: Normal sinus rhythm Low voltage QRS Left anterior fascicular block Cannot rule out Anterior infarct , age undetermined Abnormal ECG Confirmed by Melvenia Motto [phone]) on 05/11/2024 5:28:05 PM  I have reviewed the labs performed to date as well as medications administered while in observation.  Recent changes in the last 24 hours include none.  Plan  Current plan is for behavioral health has assessed the patient, plan for inpatient admission.    Garrick Charleston, MD 05/12/24 [phone]

## 2024-05-12 NOTE — ED Notes (Signed)
 Pt had unmeasured urinary occurrence. Pads/brief changed and pericare performed.

## 2024-05-12 NOTE — Progress Notes (Signed)
 LCSW Progress Note  [id number]   [name]  05/12/2024  1:51 PM  Description:   Inpatient Psychiatric Referral  Patient was recommended inpatient per Erla Rase (NP). There are no available beds at [facility], per [facility] RN). Patient was referred to the following out of network facilities:   Destination  Service Provider Address Phone Fax  [facility]  [address], [place] [address] [phone]  [facility] [address]  [address], [place] [address] [phone]  [facility]Ger[facility]  [address] [place] [address] [phone]  [facility]-Ad[facility]  [address] [place], [place] [address] [phone]  [facility]  [address] [place] [address] [phone]  [facility]  [address], [place] [address] [phone]  [facility]  [address], [place] [address] [phone]  [facility]  [address] [place] [address] [phone]  [facility] EFAX  [address] [place], [place] [phone]  [facility]  [address] [place] [address] [phone]  [facility]  [address] [place] [address] [phone]      Situation ongoing, CSW to continue following and update chart as more information becomes available.      [name], MSW, LCSW  05/12/2024 1:51 PM

## 2024-05-13 DIAGNOSIS — F329 Major depressive disorder, single episode, unspecified: Secondary | ICD-10-CM | POA: Diagnosis not present

## 2024-05-13 LAB — CBG MONITORING, ED
Glucose-Capillary: 223 mg/dL — ABNORMAL HIGH (ref 70–99)
Glucose-Capillary: 246 mg/dL — ABNORMAL HIGH (ref 70–99)

## 2024-05-13 LAB — URINALYSIS, ROUTINE W REFLEX MICROSCOPIC
Bilirubin Urine: NEGATIVE
Glucose, UA: 50 mg/dL — AB
Hgb urine dipstick: NEGATIVE
Ketones, ur: NEGATIVE mg/dL
Leukocytes,Ua: NEGATIVE
Nitrite: NEGATIVE
Protein, ur: 100 mg/dL — AB
Specific Gravity, Urine: 1.011 (ref 1.005–1.030)
pH: 7 (ref 5.0–8.0)

## 2024-05-13 LAB — URINE DRUG SCREEN
Amphetamines: NEGATIVE
Barbiturates: NEGATIVE
Benzodiazepines: NEGATIVE
Cocaine: NEGATIVE
Fentanyl: NEGATIVE
Methadone Scn, Ur: NEGATIVE
Opiates: NEGATIVE
Tetrahydrocannabinol: NEGATIVE

## 2024-05-13 NOTE — ED Notes (Signed)
 Patient is on tts at this time

## 2024-05-13 NOTE — ED Provider Notes (Signed)
 Emergency Medicine Observation Re-evaluation Note  Michelle Skinner is a 58 y.o. female, seen on rounds today.  Pt initially presented to the ED for complaints of V70.1 Currently, the patient is awake and alert, conversant, complaining of phantom pain.  Physical Exam  BP (!) 155/64   Pulse (!) 59   Temp 98.2 F (36.8 C)   Resp 17   SpO2 95%  Physical Exam General: Obese adult female awake and alert Cardiac: Regular rate and rhythm Lungs: No obvious increased work of breathing Psych: Pleasantly interactive  ED Course / MDM  EKG:EKG Interpretation Date/Time:  Tuesday May 11 2024 16:31:13 EST Ventricular Rate:  65 PR Interval:  200 QRS Duration:  116 QT Interval:  430 QTC Calculation: 447 R Axis:   -67  Text Interpretation: Normal sinus rhythm Low voltage QRS Left anterior fascicular block Cannot rule out Anterior infarct , age undetermined Abnormal ECG Confirmed by Melvenia Motto [phone]) on 05/11/2024 5:28:05 PM  I have reviewed the labs performed to date as well as medications administered while in observation.  Recent changes in the last 24 hours include none.  Plan  Current plan is for placement.  Currently no beds available patient has had referral to outside facilities.    Garrick Charleston, MD 05/13/24 [phone]

## 2024-05-13 NOTE — Discharge Instructions (Addendum)
 Be sure to follow-up with your psychiatrist and your primary care physician.  Return here for concerning changes in your condition.

## 2024-05-13 NOTE — ED Provider Notes (Signed)
 Patient seen and evaluated by behavioral today, cleared for discharge.   Garrick Charleston, MD 05/13/24 [phone]

## 2024-05-13 NOTE — Consult Note (Signed)
 [facility] Psychiatric Consult Follow-up  Patient Name: .TORRI MICHALSKI  MRN: [id number]  DOB: 1965/12/05  Consult Order details:  Orders (From admission, onward)     Start     Ordered   05/11/24 1804  CONSULT TO CALL ACT TEAM       Ordering Provider: Melvenia Motto, MD  Provider:  (Not yet assigned)  Question:  Reason for Consult?  Answer:  Psych consult   05/11/24 1804              Tele-visit Virtual Statement:TELE PSYCHIATRY ATTESTATION & CONSENT As the provider for this telehealth consult, I attest that I verified the patient's identity using two separate identifiers, introduced myself to the patient, provided my credentials, disclosed my location, and performed this encounter via a HIPAA-compliant, real-time, face-to-face, two-way, interactive audio and video platform and with the full consent and agreement of the patient (or guardian as applicable.) Patient physical location: [facility]. Telehealth provider physical location: [facility].   Video start time: 0825 Video end time: 0849    Psychiatry Consult Evaluation  Service Date: May 13, 2024  Chief Complaint Suicidal Ideation  Primary Psychiatric Diagnoses  MDD 2.  Suicidal Ideation   Assessment  NYJA WESTBROOK is a 58 y.o. female admitted: Presented to the EDfor 05/11/2024  1:54 PM for suicidal ideation. She carries the psychiatric diagnoses of MDD, GAD, PTSD and has a past medical history of  DM, GERD, HTN, bilateral BKA.   Her current presentation of suicidal ideation is most consistent with MDD. She meets criteria for outpatient psychiatry follow-up.   Current outpatient psychotropic medications include Wellbutrin  and lamictal  and historically she has had a therapeutic response to these medications. She is  compliant with medications prior to admission as evidenced by her report. On initial examination, patient contracts verbally for safety. Please see plan below for detailed recommendations.    Diagnoses:  Active Hospital problems: Active Problems:   Depression    Plan   ## Psychiatric Medication Recommendations:  Continue current medications including: -Lamotrigine  50 mg twice daily/mood -Bupropion  XL 300 mg daily/mood Continue outpatient individual counseling, increase frequency to weekly if possible.  ## Medical Decision Making Capacity: Not specifically addressed in this encounter  ## Further Work-up:  -- most recent EKG on 05/11/2024 had QtC of .    ## Disposition:-- There are no psychiatric contraindications to discharge at this time  ## Behavioral / Environmental: -Utilize compassion and acknowledge the patient's experiences while setting clear and realistic expectations for care.    ## Safety and Observation Level:  - Based on my clinical evaluation, I estimate the patient to be at minimal risk of self harm in the current setting. - At this time, we recommend  routine. This decision is based on my review of the chart including patient's history and current presentation, interview of the patient, mental status examination, and consideration of suicide risk including evaluating suicidal ideation, plan, intent, suicidal or self-harm behaviors, risk factors, and protective factors. This judgment is based on our ability to directly address suicide risk, implement suicide prevention strategies, and develop a safety plan while the patient is in the clinical setting. Please contact our team if there is a concern that risk level has changed.  CSSR Risk Category:C-SSRS RISK CATEGORY: High Risk  Suicide Risk Assessment: Patient has following modifiable risk factors for suicide: pain, medical illness (ie new dx of cancer), which we are addressing by continued medications and individual counseling.. Patient has  following non-modifiable or demographic risk factors for suicide: psychiatric hospitalization Patient has the following protective factors against suicide:  Supportive friends  Thank you for this consult request. Recommendations have been communicated to the primary team.  We will clear by psychiatry at this time.   Ellouise LITTIE Dawn, FNP       History of Present Illness  Relevant Aspects of Hospital ED Course:  Admitted on 05/11/2024 for suicidal ideation.    Patient Report:  Pat Elicker is a 58 year old female.  Patient assessed by provider via telepsychiatry monitor including video and audio.  Patient is reclined on hospital stretcher, no apparent distress.  She is alert and oriented, pleasant and cooperative during assessment.  Patient reports readiness to discharge back to [facility] nursing facility.  Patient states the lady that runs the facility is always going through peoples things, she threw away all my food, she said it was expired and it was not.  I been trying to get out of this facility for a year, I know I will have to go back for now.  Zebedee denies suicidal and homicidal ideations.  She contracts verbally for safety.  She denies auditory and visual hallucinations.  There is no evidence of delusional thought content no indication that patient is responding to internal stimuli.  Patient is followed by individual counseling, meets with counselor every other week.  Reviewed plan to consider meeting with counseling weekly, she agrees with plan.  Patient is compliant with medications including Lamictal  and bupropion .  Patient offered support and encouragement.  She is insightful today.  Patient states I enjoy it when we play games, we play bingo every Tuesday and every Thursday.  They let me control the television and I like that.  I enjoyed my art, it keeps me calm and centered.  Psych ROS:  Depression: current  Anxiety:  intermittent Mania (lifetime and current): denies Psychosis: (lifetime and current): denies   Review of Systems  Constitutional: Negative.   HENT: Negative.    Eyes: Negative.   Respiratory: Negative.     Gastrointestinal: Negative.   Genitourinary: Negative.   Neurological: Negative.   Psychiatric/Behavioral:  Positive for depression.      Psychiatric and Social History  Psychiatric History:  Information collected from patient and medical record  Prev Dx/Sx: MDD, PTSD Therapy: Counseling currently biweekly   Prior Self Harm: none reported Prior Violence: denies  Family Psych History: None reported Family Hx suicide: None reported  Social History:   Legal Hx: none reported Living Situation: Assisted living facility, [facility]  Access to weapons/lethal means: Denies  Substance History Alcohol: Denies Illicit drugs: Denies Prescription drug abuse: Denies Rehab hx: Denies  Exam Findings  Physical Exam:  Vital Signs:  Temp:  [98.2 F (36.8 C)-98.4 F (36.9 C)] 98.2 F (36.8 C) (11/06 0414) Pulse Rate:  [59-73] 59 (11/06 0414) Resp:  [16-17] 17 (11/06 0414) BP: (155-159)/(61-64) 155/64 (11/06 0414) SpO2:  [94 %-95 %] 95 % (11/06 0414) Blood pressure (!) 155/64, pulse (!) 59, temperature 98.2 F (36.8 C), resp. rate 17, SpO2 95%. There is no height or weight on file to calculate BMI.  Physical Exam Vitals and nursing note reviewed.  Constitutional:      Appearance: Normal appearance. She is obese.  HENT:     Head: Normocephalic and atraumatic.  Cardiovascular:     Rate and Rhythm: Normal rate.  Pulmonary:     Effort: Pulmonary effort is normal.  Musculoskeletal:  Cervical back: Normal range of motion.     Comments: Bilateral BKA  Neurological:     Mental Status: She is alert and oriented to person, place, and time.  Psychiatric:        Attention and Perception: Attention and perception normal.        Mood and Affect: Affect normal. Mood is depressed.        Speech: Speech normal.        Behavior: Behavior normal. Behavior is cooperative.        Thought Content: Thought content normal.        Cognition and Memory: Cognition and memory  normal.     Mental Status Exam: General Appearance: Fairly Groomed  Orientation:  Full (Time, Place, and Person)  Memory:  Immediate;   Good Recent;   Good Remote;   Good  Concentration:  Concentration: Good and Attention Span: Good  Recall:  Good  Attention  Good  Eye Contact:  Good  Speech:  Clear and Coherent and Normal Rate  Language:  Good  Volume:  Normal  Mood: Depressed  Affect:  Appropriate and Congruent  Thought Process:  Coherent, Goal Directed, and Linear  Thought Content:  WDL and Logical  Suicidal Thoughts:  No  Homicidal Thoughts:  No  Judgement:  Fair  Insight:  Fair  Psychomotor Activity:  Normal  Akathisia:  No  Fund of Knowledge:  Good      Assets:  Communication Skills Desire for Improvement Housing Leisure Time Resilience Social Support  Cognition:  WNL  ADL's:  Impaired  AIMS (if indicated):        Other History   These have been pulled in through the EMR, reviewed, and updated if appropriate.  Family History:  The patient's family history includes CAD in her father; Cancer in her maternal grandmother; Diabetes in her father; Hyperlipidemia in her father; Hypertension in her maternal grandmother and mother; Osteoporosis in her maternal grandmother; Stroke in her father.  Medical History: Past Medical History:  Diagnosis Date   Amputee, above knee, left (HCC)    Amputee, above knee, right (HCC)    Anemia    Cervical cancer (HCC)    Depression    Diabetes mellitus, type II (HCC)    GERD (gastroesophageal reflux disease)    Hyperlipidemia    Neuropathy    PTSD (post-traumatic stress disorder)     Surgical History: Past Surgical History:  Procedure Laterality Date   ABDOMINAL HYSTERECTOMY     AMPUTATION Bilateral 09/13/2020   Procedure: BILATERAL BELOW KNEE AMPUTATIONS;  Surgeon: Harden Jerona GAILS, MD;  Location: [facility] OR;  Service: Orthopedics;  Laterality: Bilateral;   COLONOSCOPY WITH PROPOFOL  N/A 05/25/2018   Procedure: COLONOSCOPY  WITH PROPOFOL ;  Surgeon: Shaaron Lamar HERO, MD;  Location: [facility] ENDO SUITE;  Service: Endoscopy;  Laterality: N/A;  7:30am   I & D EXTREMITY Bilateral 09/10/2020   Procedure: IRRIGATION AND DEBRIDEMENT FEET;;  Surgeon: Harden Jerona GAILS, MD;  Location: [facility] OR;  Service: Orthopedics;  Laterality: Bilateral;   IRRIGATION AND DEBRIDEMENT FOOT Bilateral 09/08/2020   Procedure: IRRIGATION AND DEBRIDEMENT FOOT;  Surgeon: Kallie Manuelita BROCKS, MD;  Location: [facility] ORS;  Service: General;  Laterality: Bilateral;  left foot 11 x 10.5 x 1    OTHER SURGICAL HISTORY Right    R Foot- I&D   POLYPECTOMY  05/25/2018   Procedure: POLYPECTOMY;  Surgeon: Shaaron Lamar HERO, MD;  Location: [facility];  Service: Endoscopy;;  colon   UMBILICAL HERNIA REPAIR  2007     Medications:   Current Facility-Administered Medications:    acetaminophen  (TYLENOL ) tablet 650 mg, 650 mg, Oral, Q6H PRN, Melvenia Motto, MD, 650 mg at 05/12/24 1735   atorvastatin  (LIPITOR) tablet 80 mg, 80 mg, Oral, Daily, Melvenia Motto, MD, 80 mg at 05/12/24 9060   buPROPion  (WELLBUTRIN  XL) 24 hr tablet 300 mg, 300 mg, Oral, Daily, Melvenia Motto, MD, 300 mg at 05/12/24 9062   carvedilol  (COREG ) tablet 12.5 mg, 12.5 mg, Oral, BID, Melvenia Motto, MD, 12.5 mg at 05/12/24 2253   cloNIDine  (CATAPRES ) tablet 0.1 mg, 0.1 mg, Oral, QHS, Dixon, Motto, MD, 0.1 mg at 05/12/24 2253   insulin  aspart (novoLOG ) injection 11-16 Units, 11-16 Units, Subcutaneous, TID AC, Lockwood, Robert, MD, 13 Units at 05/12/24 1735   insulin  glargine-yfgn (SEMGLEE ) injection 30 Units, 30 Units, Subcutaneous, QHS, Melvenia Motto, MD, 30 Units at 05/12/24 2254   lamoTRIgine  (LAMICTAL ) tablet 25 mg, 25 mg, Oral, BID, Melvenia Motto, MD, 25 mg at 05/12/24 2254   levothyroxine  (SYNTHROID ) tablet 175 mcg, 175 mcg, Oral, Daily, Melvenia Motto, MD, 175 mcg at 05/12/24 9060   lisinopril  (ZESTRIL ) tablet 10 mg, 10 mg, Oral, Daily, Melvenia Motto, MD, 10 mg at 05/12/24 9061   methocarbamol  (ROBAXIN ) tablet 500 mg, 500 mg,  Oral, BID, Melvenia Motto, MD, 500 mg at 05/12/24 2254   pantoprazole  (PROTONIX ) EC tablet 40 mg, 40 mg, Oral, Daily, Melvenia Motto, MD, 40 mg at 05/12/24 9062   pregabalin  (LYRICA ) capsule 50 mg, 50 mg, Oral, Daily, Melvenia Motto, MD, 50 mg at 05/12/24 0939   torsemide (DEMADEX) tablet 20 mg, 20 mg, Oral, BID, Melvenia Motto, MD, 20 mg at 05/12/24 2257  Current Outpatient Medications:    acetaminophen  (TYLENOL ) 500 MG tablet, Take 1,000 mg by mouth in the morning and at bedtime. Pain to bilateral thighs, Disp: , Rfl:    Amino Acids-Protein Hydrolys (FEEDING SUPPLEMENT, PRO-STAT SUGAR FREE 64,) LIQD, Take 30 mLs by mouth daily., Disp: , Rfl:    APPLE CIDER VINEGAR PO, Take 2 tablets by mouth in the morning, at noon, and at bedtime. Gummies for weight loss, Disp: , Rfl:    atorvastatin  (LIPITOR) 80 MG tablet, Take 80 mg by mouth at bedtime., Disp: , Rfl:    benzonatate (TESSALON) 200 MG capsule, Take 200 mg by mouth every 8 (eight) hours as needed for cough (congestion)., Disp: , Rfl:    buPROPion  (WELLBUTRIN  XL) 300 MG 24 hr tablet, Take 300 mg by mouth daily., Disp: , Rfl:    carvedilol  (COREG ) 12.5 MG tablet, Take 1 tablet (12.5 mg total) by mouth 2 (two) times daily., Disp: 60 tablet, Rfl: 11   Cholecalciferol  (TRUE VITAMIN D3) 1.25 MG (50000 UT) TABS, Take 1 tablet by mouth every Friday., Disp: , Rfl:    cloNIDine  (CATAPRES ) 0.1 MG tablet, Take 0.1 mg by mouth at bedtime., Disp: , Rfl:    cyanocobalamin (VITAMIN B12) 1000 MCG tablet, Take 1,000 mcg by mouth daily., Disp: , Rfl:    ezetimibe (ZETIA) 10 MG tablet, Take 10 mg by mouth at bedtime., Disp: , Rfl:    Ferrous Sulfate (IRON PO), Take 240 mg by mouth daily., Disp: , Rfl:    HUMALOG  KWIKPEN 100 UNIT/ML KwikPen, Inject 11-16 Units into the skin 3 (three) times daily. Per sliding scale: If 151-200 = 11 units 201-250 = 12 units 251-300 = 13 units 301-350 = 14 units 351-400 = 16 units >400 = call provider, Disp: , Rfl:    lamoTRIgine  (LAMICTAL ) 25  MG  tablet, Take 50 mg by mouth 2 (two) times daily., Disp: , Rfl:    LANTUS  SOLOSTAR 100 UNIT/ML Solostar Pen, Inject 50 Units into the skin every evening., Disp: , Rfl:    levothyroxine  (SYNTHROID ) 175 MCG tablet, Take 175 mcg by mouth daily., Disp: , Rfl:    Liraglutide -Weight Management 18 MG/3ML SOPN, Inject 1.2 mg into the skin daily., Disp: , Rfl:    lisinopril  (ZESTRIL ) 10 MG tablet, Take 10 mg by mouth daily., Disp: , Rfl:    LOKELMA  10 g PACK packet, Take 1 packet by mouth daily., Disp: , Rfl:    methocarbamol  (ROBAXIN ) 500 MG tablet, Take 1 tablet (500 mg total) by mouth 2 (two) times daily., Disp: 20 tablet, Rfl: 0   naloxone (NARCAN) nasal spray 4 mg/0.1 mL, Place 1 spray into the nose once as needed (overdose of opioids)., Disp: , Rfl:    ondansetron  (ZOFRAN ) 4 MG tablet, Take 4 mg by mouth every 4 (four) hours as needed for nausea or vomiting., Disp: , Rfl:    OVER THE COUNTER MEDICATION, Take 1 capsule by mouth daily. GLP-X super evolution per GI MD, Disp: , Rfl:    oxyCODONE  (OXY IR/ROXICODONE ) 5 MG immediate release tablet, Take 5 mg by mouth every 6 (six) hours as needed for severe pain (pain score 7-10)., Disp: , Rfl:    pantoprazole  (PROTONIX ) 40 MG tablet, TAKE ONE (1) TABLET EACH DAY, Disp: 90 tablet, Rfl: 0   pregabalin  (LYRICA ) 25 MG capsule, Take 25 mg by mouth daily., Disp: , Rfl:    pregabalin  (LYRICA ) 50 MG capsule, Take 50 mg by mouth at bedtime., Disp: , Rfl:    Torsemide 40 MG TABS, Take 40 mg by mouth daily., Disp: , Rfl:    Continuous Glucose Receiver (FREESTYLE LIBRE 2 READER) DEVI, As directed, Disp: 1 each, Rfl: 0   Continuous Glucose Sensor (FREESTYLE LIBRE 2 SENSOR) MISC, 1 Piece by Does not apply route every 14 (fourteen) days., Disp: 3 each, Rfl: 3  Allergies: Allergies  Allergen Reactions   Cherry Itching, Swelling, Rash and Other (See Comments)    Throat swelling   Other Itching, Swelling and Rash    Reaction to Hot peppers (throat swelling)    Canagliflozin Diarrhea and Nausea And Vomiting   Clindamycin/Lincomycin Diarrhea and Nausea And Vomiting   Coenzyme Q10-Black Pepper Other (See Comments)    Unknown    Metformin And Related Diarrhea and Nausea And Vomiting   Ozempic  (0.25 Or 0.5 Mg-Dose) [Semaglutide (0.25 Or 0.5mg -Dos)] Diarrhea and Nausea And Vomiting   Sulfa Antibiotics Diarrhea, Nausea And Vomiting and Rash   Gabapentin  Diarrhea, Nausea And Vomiting and Rash    Unknown   Latex Itching and Rash    Reaction happened over a long period of time   Polyurethane [Polyurethane] Swelling and Other (See Comments)    Blistering   Sulfamethoxazole-Trimethoprim Diarrhea, Nausea And Vomiting, Rash and Itching   Tape Itching and Swelling    Reaction after a few days use/ please use paper tape    Ellouise LITTIE Dawn, FNP

## 2024-05-14 DIAGNOSIS — F329 Major depressive disorder, single episode, unspecified: Secondary | ICD-10-CM | POA: Diagnosis not present

## 2024-07-21 ENCOUNTER — Emergency Department (HOSPITAL_COMMUNITY)
Admission: EM | Admit: 2024-07-21 | Discharge: 2024-07-21 | Disposition: A | Discharge status: Home / Self Care | Source: Skilled Nursing Facility | Attending: Emergency Medicine | Admitting: Emergency Medicine

## 2024-07-21 ENCOUNTER — Other Ambulatory Visit: Payer: Self-pay

## 2024-07-21 DIAGNOSIS — Z794 Long term (current) use of insulin: Secondary | ICD-10-CM | POA: Insufficient documentation

## 2024-07-21 DIAGNOSIS — Z91040 Latex allergy status: Secondary | ICD-10-CM | POA: Diagnosis not present

## 2024-07-21 DIAGNOSIS — N179 Acute kidney failure, unspecified: Secondary | ICD-10-CM | POA: Diagnosis not present

## 2024-07-21 DIAGNOSIS — R799 Abnormal finding of blood chemistry, unspecified: Secondary | ICD-10-CM | POA: Diagnosis present

## 2024-07-21 DIAGNOSIS — R197 Diarrhea, unspecified: Secondary | ICD-10-CM | POA: Diagnosis not present

## 2024-07-21 LAB — COMPREHENSIVE METABOLIC PANEL WITH GFR
ALT: 19 U/L (ref 0–44)
AST: 18 U/L (ref 15–41)
Albumin: 3.3 g/dL — ABNORMAL LOW (ref 3.5–5.0)
Alkaline Phosphatase: 189 U/L — ABNORMAL HIGH (ref 38–126)
Anion gap: 13 (ref 5–15)
BUN: 61 mg/dL — ABNORMAL HIGH (ref 6–20)
CO2: 23 mmol/L (ref 22–32)
Calcium: 8.8 mg/dL — ABNORMAL LOW (ref 8.9–10.3)
Chloride: 108 mmol/L (ref 98–111)
Creatinine, Ser: 2.66 mg/dL — ABNORMAL HIGH (ref 0.44–1.00)
GFR, Estimated: 20 mL/min — ABNORMAL LOW
Glucose, Bld: 186 mg/dL — ABNORMAL HIGH (ref 70–99)
Potassium: 4.6 mmol/L (ref 3.5–5.1)
Sodium: 144 mmol/L (ref 135–145)
Total Bilirubin: 0.3 mg/dL (ref 0.0–1.2)
Total Protein: 6.9 g/dL (ref 6.5–8.1)

## 2024-07-21 LAB — CBC WITH DIFFERENTIAL/PLATELET
Abs Immature Granulocytes: 0.04 K/uL (ref 0.00–0.07)
Basophils Absolute: 0 K/uL (ref 0.0–0.1)
Basophils Relative: 0 %
Eosinophils Absolute: 0.1 K/uL (ref 0.0–0.5)
Eosinophils Relative: 2 %
HCT: 34.6 % — ABNORMAL LOW (ref 36.0–46.0)
Hemoglobin: 11.1 g/dL — ABNORMAL LOW (ref 12.0–15.0)
Immature Granulocytes: 1 %
Lymphocytes Relative: 18 %
Lymphs Abs: 1.4 K/uL (ref 0.7–4.0)
MCH: 30.7 pg (ref 26.0–34.0)
MCHC: 32.1 g/dL (ref 30.0–36.0)
MCV: 95.8 fL (ref 80.0–100.0)
Monocytes Absolute: 0.5 K/uL (ref 0.1–1.0)
Monocytes Relative: 7 %
Neutro Abs: 5.3 K/uL (ref 1.7–7.7)
Neutrophils Relative %: 72 %
Platelets: 264 K/uL (ref 150–400)
RBC: 3.61 MIL/uL — ABNORMAL LOW (ref 3.87–5.11)
RDW: 12 % (ref 11.5–15.5)
WBC: 7.4 K/uL (ref 4.0–10.5)
nRBC: 0 % (ref 0.0–0.2)

## 2024-07-21 LAB — MAGNESIUM: Magnesium: 2 mg/dL (ref 1.7–2.4)

## 2024-07-21 MED ORDER — SODIUM CHLORIDE 0.9 % IV BOLUS
1000.0000 mL | Freq: Once | INTRAVENOUS | Status: AC
  Administered 2024-07-21: 1000 mL via INTRAVENOUS

## 2024-07-21 NOTE — ED Triage Notes (Signed)
 Pt arrived [facility] from [facility] for abnormal labs. [facility] stated he was told high potassium and AKI. No paperwork showing which labs are abnormal. An order on Mar shows CBC with Diff, CMP, Vit D and A1c were drawn on Jan 12th. No c/o from pt.

## 2024-07-21 NOTE — ED Provider Notes (Signed)
 " [facility] AT [facility] Provider Note   CSN: [id number] Arrival date & time: 07/21/24  9091     Patient presents with: abnormal labs   Michelle Skinner is a 59 y.o. female.   Patient is a 59 year old female who presents emergency department from her long-term care facility secondary to abnormal labs.  Per the facility it was noted that she had elevated potassium and elevated creatinine.  Patient did not arrive with labs with her.  Patient notes that she otherwise feels at her baseline with no acute complaints at this time.  Patient notes for the past month she has had ongoing diarrhea but notes that her stool has returned back to her baseline.  She has had no associated vomiting.  She denies any chest pain, shortness of breath, palpitations.  She has had no associated abdominal pain.  She denies any muscle cramping.        Prior to Admission medications  Medication Sig Start Date End Date Taking? Authorizing Provider  acetaminophen  (TYLENOL ) 500 MG tablet Take 1,000 mg by mouth in the morning and at bedtime. Pain to bilateral thighs    [provider]  Amino Acids-Protein Hydrolys (FEEDING SUPPLEMENT, PRO-STAT SUGAR FREE 64,) LIQD Take 30 mLs by mouth daily.    [provider]  APPLE CIDER VINEGAR PO Take 2 tablets by mouth in the morning, at noon, and at bedtime. Gummies for weight loss    [provider]  atorvastatin  (LIPITOR) 80 MG tablet Take 80 mg by mouth at bedtime. 04/11/23   [provider]  benzonatate (TESSALON) 200 MG capsule Take 200 mg by mouth every 8 (eight) hours as needed for cough (congestion).    [provider]  buPROPion  (WELLBUTRIN  XL) 300 MG 24 hr tablet Take 300 mg by mouth daily. 04/07/24   [provider]  carvedilol  (COREG ) 12.5 MG tablet Take 1 tablet (12.5 mg total) by mouth 2 (two) times daily. 09/17/20 05/11/24  Gonfa, Taye T, MD  Cholecalciferol  (TRUE VITAMIN D3) 1.25 MG  (50000 UT) TABS Take 1 tablet by mouth every Friday.    [provider]  cloNIDine  (CATAPRES ) 0.1 MG tablet Take 0.1 mg by mouth at bedtime.    [provider]  Continuous Glucose Receiver (FREESTYLE LIBRE 2 READER) DEVI As directed 04/29/23   [name], Gebreselassie W, MD  Continuous Glucose Sensor (FREESTYLE LIBRE 2 SENSOR) MISC 1 Piece by Does not apply route every 14 (fourteen) days. 12/25/22   [name], Gebreselassie W, MD  cyanocobalamin (VITAMIN B12) 1000 MCG tablet Take 1,000 mcg by mouth daily.    [provider]  ezetimibe (ZETIA) 10 MG tablet Take 10 mg by mouth at bedtime. 04/28/24   [provider]  Ferrous Sulfate (IRON PO) Take 240 mg by mouth daily.    [provider]  HUMALOG  KWIKPEN 100 UNIT/ML KwikPen Inject 11-16 Units into the skin 3 (three) times daily. Per sliding scale: If 151-200 = 11 units 201-250 = 12 units 251-300 = 13 units 301-350 = 14 units 351-400 = 16 units >400 = call provider 04/18/24   [provider]  lamoTRIgine  (LAMICTAL ) 25 MG tablet Take 50 mg by mouth 2 (two) times daily.    [provider]  LANTUS  SOLOSTAR 100 UNIT/ML Solostar Pen Inject 50 Units into the skin every evening. 04/17/24   [provider]  levothyroxine  (SYNTHROID ) 175 MCG tablet Take 175 mcg by mouth daily. 04/25/24   [provider]  Liraglutide -Weight Management 18 MG/3ML SOPN Inject 1.2 mg into the skin daily.    [provider]  lisinopril  (ZESTRIL ) 10 MG tablet Take 10 mg by mouth daily. 03/30/24   [provider]  LOKELMA  10 g PACK packet Take 1 packet by mouth daily. 07/10/23   [provider]  methocarbamol  (ROBAXIN ) 500 MG tablet Take 1 tablet (500 mg total) by mouth 2 (two) times daily. 12/19/23   Henderly, Britni A, PA-C  naloxone (NARCAN) nasal spray 4 mg/0.1 mL Place 1 spray into the nose once as needed (overdose of opioids).    [provider]  ondansetron  (ZOFRAN ) 4 MG  tablet Take 4 mg by mouth every 4 (four) hours as needed for nausea or vomiting. 03/24/24   [provider]  OVER THE COUNTER MEDICATION Take 1 capsule by mouth daily. GLP-X super evolution per GI MD    [provider]  oxyCODONE  (OXY IR/ROXICODONE ) 5 MG immediate release tablet Take 5 mg by mouth every 6 (six) hours as needed for severe pain (pain score 7-10).    [provider]  pantoprazole  (PROTONIX ) 40 MG tablet TAKE ONE (1) TABLET EACH DAY 12/01/18   [name], Gebreselassie W, MD  pregabalin  (LYRICA ) 25 MG capsule Take 25 mg by mouth daily.    [provider]  pregabalin  (LYRICA ) 50 MG capsule Take 50 mg by mouth at bedtime.    [provider]  Torsemide  40 MG TABS Take 40 mg by mouth daily. 04/27/24   [provider]    Allergies: Cherry, Other, Canagliflozin, Clindamycin/lincomycin, Coenzyme q10-black pepper, Metformin and related, Ozempic  (0.25 or 0.5 mg-dose) [semaglutide (0.25 or 0.5mg -dos)], Sulfa antibiotics, Gabapentin , Latex, Polyurethane [polyurethane], Sulfamethoxazole-trimethoprim, and Tape    Review of Systems  All other systems reviewed and are negative.   Updated Vital Signs BP (!) 150/85 (BP Location: Right Arm)   Pulse 77   Temp 97.8 F (36.6 C) (Oral)   Resp 15   Ht 4' 7 (1.397 m)   Wt (!) 181.4 kg   SpO2 100%   BMI 92.97 kg/m   Physical Exam Nursing note reviewed.  Constitutional:      General: She is not in acute distress.    Appearance: Normal appearance. She is not ill-appearing.  HENT:     Head: Normocephalic and atraumatic.     Nose: Nose normal.     Mouth/Throat:     Mouth: Mucous membranes are moist.  Eyes:     Extraocular Movements: Extraocular movements intact.     Conjunctiva/sclera: Conjunctivae normal.     Pupils: Pupils are equal, round, and reactive to light.  Cardiovascular:     Rate and Rhythm: Normal rate and regular rhythm.     Pulses: Normal pulses.     Heart sounds: Normal heart  sounds. No murmur heard.    No gallop.  Pulmonary:     Effort: Pulmonary effort is normal. No respiratory distress.     Breath sounds: Normal breath sounds. No stridor. No wheezing, rhonchi or rales.  Abdominal:     General: Abdomen is flat. Bowel sounds are normal. There is no distension.     Palpations: Abdomen is soft.     Tenderness: There is no abdominal tenderness. There is no guarding.  Musculoskeletal:        General: Normal range of motion.     Cervical back: Normal range of motion and neck supple. No rigidity or tenderness.     Comments: Status post bilateral AKA  Skin:    General: Skin is warm and dry.  Neurological:     General: No focal deficit present.     Mental Status: She is alert and oriented to person, place, and time. Mental status is at baseline.     Cranial Nerves: No cranial nerve deficit.     Sensory: No sensory deficit.     Motor: No weakness.     Coordination: Coordination normal.  Psychiatric:        Mood and Affect: Mood normal.        Behavior: Behavior normal.        Thought Content: Thought content normal.        Judgment: Judgment normal.     (all labs ordered are listed, but only abnormal results are displayed) Labs Reviewed  COMPREHENSIVE METABOLIC PANEL WITH GFR  CBC WITH DIFFERENTIAL/PLATELET  MAGNESIUM     EKG: None  Radiology: No results found.   Procedures   Medications Ordered in the ED - No data to display                                  Medical Decision Making Amount and/or Complexity of Data Reviewed Labs: ordered.   This patient presents to the ED for concern of abnormal labs differential diagnosis includes electrolyte derangement, acute kidney injury, sepsis, pneumonia, urinary tract infection    Additional history obtained:  Additional history obtained from medical records External records from outside source obtained and reviewed including medical records   Lab Tests:  I Ordered, and personally  interpreted labs.  The pertinent results include: No leukocytosis, anemia at baseline, mild elevation in creatinine from baseline, unremarkable electrolytes, normal liver function, normal magnesium     Medicines ordered and prescription drug management:  I ordered medication including IV fluids for acute on chronic kidney injury Reevaluation of the patient after these medicines showed that the patient improved I have reviewed the patients home medicines and have made adjustments as needed   Problem List / ED Course:  Patient is doing well at this time and does remain stable.  Patient has no acute complaints in the emergency department and notes that she otherwise feels at her baseline.  She does have a mild acute on chronic kidney injury at this point and was given some IV fluids.  She has no other indication for fluid overload at this time.  Suspect that this may be secondary to overdiuresis as patient notes that she has been given a lot of Lasix.  She has no changes in her electrolytes at this time.  EKG is unremarkable.  Do not suspect any further emergent workup or admission is warranted at this time.  Discussed the need for close follow-up with primary care doctor for continued evaluation.  Strict return precautions were discussed for any new or worsening symptoms.  Patient voiced understanding and had no additional questions.   Social Determinants of Health:  None        Final diagnoses:  None    ED Discharge Orders     None          Daralene Lonni JONETTA DEVONNA 07/21/24 1306    Melvenia Motto, MD 07/21/24 1503  "

## 2024-07-21 NOTE — ED Notes (Signed)
Attempted report again. 

## 2024-07-21 NOTE — ED Notes (Signed)
 Attempted to call report back to [facility] with no answer. This is the 3rd attempt.

## 2024-07-21 NOTE — ED Notes (Signed)
 [facility] called to transport patient back to facility. Nurse aware.

## 2024-07-21 NOTE — Discharge Instructions (Signed)
 Please follow-up closely with your primary care doctor on an outpatient basis.  Return to emergency department immediately for any new or worsening symptoms.  [facility] Primary Care Doctor List    Alberteen Huge, MD. Specialty: [facility] Medicine Contact information: [address]  [place] [address]  [phone]   Charlotta Cook, MD. Specialty: [facility] Medicine Contact information: [address]  [place] [address]  [phone]   Clary Crown, MD Specialty: Internal Medicine Contact information: [address] [place] [address]  [phone]   Lucendia Rusk, MD. Specialty: Internal Medicine Contact information: [address]  [place] [address]  [phone]    [facility] (Dr. Burdette Carolin) Specialty: Family Medicine Contact information: [address]  [place] [address]  [phone]   Darrall Ellison, MD. Specialty: [facility] Medicine Contact information: [address]  [address]  [place] [address]  [phone]   Artemisa Bile, MD. Specialty: Internal Medicine Contact information: [address]  [address]  [place] [address]  [phone]   [facility] Family Medicine: [address] [phone]  Selene Dais, Family medicine [address]  [phone]  [facility] [address] [place], [place] [phone]  [facility]: [address] [phone]    [facility]  [address] [place], [place] [address] [phone]  Services [facility] offers a variety of basic health services.  Services include but are not limited to: Blood pressure checks  Heart rate checks  Blood sugar checks  Urine analysis  Rapid strep tests  Pregnancy tests.  Health education and referrals  People needing more complex services will be directed to a physician online. Using these virtual visits, doctors can  evaluate and prescribe medicine and treatments. There will be no medication on-site, though [facility] will help patients fill their prescriptions at little to no cost.   For More information please go to: [email or web address]  Allergy and Asthma:    [address] Selene Dais [phone]  Urology:  [address]  [place] [phone]  [facility] [place] [facility]  [address] [place], [place] [phone]  Orthopedics   [address] [place], [place] [phone]  Endocrinology  [address] [place], [place] [phone]  Podiatry: [facility] [phone]

## 2024-07-21 NOTE — ED Notes (Signed)
 Called report to [place] to [place], LPN.

## 2024-07-21 NOTE — ED Notes (Signed)
 Attempted to call report and staff hung up on nurse.
